# Patient Record
Sex: Female | Born: 1935 | Race: White | Hispanic: No | Smoking: Former smoker
Health system: Southern US, Community
[De-identification: ages and names within clinical notes are randomized; demographics above are authoritative.]

## PROBLEM LIST (undated history)

## (undated) DIAGNOSIS — I82409 Acute embolism and thrombosis of unspecified deep veins of unspecified lower extremity: Secondary | ICD-10-CM

## (undated) DIAGNOSIS — I2510 Atherosclerotic heart disease of native coronary artery without angina pectoris: Secondary | ICD-10-CM

## (undated) DIAGNOSIS — I050 Rheumatic mitral stenosis: Secondary | ICD-10-CM

## (undated) DIAGNOSIS — I509 Heart failure, unspecified: Secondary | ICD-10-CM

## (undated) DIAGNOSIS — D509 Iron deficiency anemia, unspecified: Secondary | ICD-10-CM

## (undated) DIAGNOSIS — I4891 Unspecified atrial fibrillation: Secondary | ICD-10-CM

## (undated) DIAGNOSIS — I739 Peripheral vascular disease, unspecified: Secondary | ICD-10-CM

## (undated) DIAGNOSIS — Z9289 Personal history of other medical treatment: Secondary | ICD-10-CM

## (undated) DIAGNOSIS — M169 Osteoarthritis of hip, unspecified: Secondary | ICD-10-CM

## (undated) DIAGNOSIS — E785 Hyperlipidemia, unspecified: Secondary | ICD-10-CM

## (undated) DIAGNOSIS — N200 Calculus of kidney: Secondary | ICD-10-CM

## (undated) DIAGNOSIS — I5032 Chronic diastolic (congestive) heart failure: Secondary | ICD-10-CM

## (undated) DIAGNOSIS — I219 Acute myocardial infarction, unspecified: Secondary | ICD-10-CM

## (undated) DIAGNOSIS — I10 Essential (primary) hypertension: Secondary | ICD-10-CM

## (undated) DIAGNOSIS — C4490 Unspecified malignant neoplasm of skin, unspecified: Secondary | ICD-10-CM

## (undated) DIAGNOSIS — G8929 Other chronic pain: Secondary | ICD-10-CM

## (undated) DIAGNOSIS — M1990 Unspecified osteoarthritis, unspecified site: Secondary | ICD-10-CM

## (undated) DIAGNOSIS — I350 Nonrheumatic aortic (valve) stenosis: Secondary | ICD-10-CM

## (undated) DIAGNOSIS — Z9641 Presence of insulin pump (external) (internal): Secondary | ICD-10-CM

## (undated) DIAGNOSIS — M546 Pain in thoracic spine: Secondary | ICD-10-CM

## (undated) DIAGNOSIS — I482 Chronic atrial fibrillation, unspecified: Secondary | ICD-10-CM

## (undated) DIAGNOSIS — E119 Type 2 diabetes mellitus without complications: Secondary | ICD-10-CM

## (undated) DIAGNOSIS — I872 Venous insufficiency (chronic) (peripheral): Secondary | ICD-10-CM

## (undated) DIAGNOSIS — M549 Dorsalgia, unspecified: Secondary | ICD-10-CM

## (undated) DIAGNOSIS — J9600 Acute respiratory failure, unspecified whether with hypoxia or hypercapnia: Secondary | ICD-10-CM

## (undated) DIAGNOSIS — G43909 Migraine, unspecified, not intractable, without status migrainosus: Secondary | ICD-10-CM

## (undated) HISTORY — DX: Heart failure, unspecified: I50.9

## (undated) HISTORY — DX: Venous insufficiency (chronic) (peripheral): I87.2

## (undated) HISTORY — DX: Chronic diastolic (congestive) heart failure: I50.32

## (undated) HISTORY — PX: BACK SURGERY: SHX140

## (undated) HISTORY — DX: Essential (primary) hypertension: I10

## (undated) HISTORY — DX: Osteoarthritis of hip, unspecified: M16.9

## (undated) HISTORY — DX: Unspecified atrial fibrillation: I48.91

## (undated) HISTORY — DX: Hyperlipidemia, unspecified: E78.5

## (undated) HISTORY — PX: JOINT REPLACEMENT: SHX530

## (undated) HISTORY — PX: ABDOMINAL HYSTERECTOMY: SHX81

## (undated) HISTORY — PX: TONSILLECTOMY: SUR1361

## (undated) HISTORY — PX: APPENDECTOMY: SHX54

## (undated) HISTORY — DX: Peripheral vascular disease, unspecified: I73.9

## (undated) HISTORY — PX: CHOLECYSTECTOMY OPEN: SUR202

## (undated) HISTORY — DX: Nonrheumatic aortic (valve) stenosis: I35.0

---
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
## 1989-08-20 HISTORY — PX: TOTAL HIP ARTHROPLASTY: SHX124

## 1992-08-20 HISTORY — PX: REVISION TOTAL HIP ARTHROPLASTY: SHX766

## 1997-08-20 HISTORY — PX: LUMBAR DISC SURGERY: SHX700

## 1998-01-28 ENCOUNTER — Ambulatory Visit (HOSPITAL_COMMUNITY): Admission: RE | Admit: 1998-01-28 | Discharge: 1998-01-28 | Payer: Self-pay | Admitting: Orthopaedic Surgery

## 1998-03-15 ENCOUNTER — Ambulatory Visit (HOSPITAL_COMMUNITY): Admission: RE | Admit: 1998-03-15 | Discharge: 1998-03-16 | Payer: Self-pay | Admitting: Orthopaedic Surgery

## 1998-07-27 ENCOUNTER — Encounter: Admission: RE | Admit: 1998-07-27 | Discharge: 1998-07-27 | Payer: Self-pay | Admitting: Family Medicine

## 1998-10-21 ENCOUNTER — Ambulatory Visit (HOSPITAL_COMMUNITY): Admission: RE | Admit: 1998-10-21 | Discharge: 1998-10-21 | Payer: Self-pay | Admitting: Family Medicine

## 1998-10-21 ENCOUNTER — Encounter: Admission: RE | Admit: 1998-10-21 | Discharge: 1998-10-21 | Payer: Self-pay | Admitting: Family Medicine

## 1998-12-05 ENCOUNTER — Encounter: Payer: Self-pay | Admitting: Cardiology

## 1998-12-05 ENCOUNTER — Ambulatory Visit (HOSPITAL_COMMUNITY): Admission: RE | Admit: 1998-12-05 | Discharge: 1998-12-05 | Payer: Self-pay | Admitting: Cardiology

## 1999-01-20 ENCOUNTER — Encounter: Admission: RE | Admit: 1999-01-20 | Discharge: 1999-01-20 | Payer: Self-pay | Admitting: Family Medicine

## 2000-08-20 HISTORY — PX: ILIAC ARTERY STENT: SHX1786

## 2000-12-11 ENCOUNTER — Encounter: Payer: Self-pay | Admitting: Cardiology

## 2000-12-11 ENCOUNTER — Ambulatory Visit (HOSPITAL_COMMUNITY): Admission: RE | Admit: 2000-12-11 | Discharge: 2000-12-11 | Payer: Self-pay | Admitting: Cardiology

## 2003-10-02 ENCOUNTER — Emergency Department (HOSPITAL_COMMUNITY): Admission: EM | Admit: 2003-10-02 | Discharge: 2003-10-03 | Payer: Self-pay | Admitting: Emergency Medicine

## 2003-12-17 ENCOUNTER — Encounter: Admission: RE | Admit: 2003-12-17 | Discharge: 2003-12-17 | Payer: Self-pay | Admitting: Internal Medicine

## 2004-02-11 ENCOUNTER — Encounter: Admission: RE | Admit: 2004-02-11 | Discharge: 2004-02-11 | Payer: Self-pay | Admitting: Internal Medicine

## 2004-06-06 ENCOUNTER — Encounter: Admission: RE | Admit: 2004-06-06 | Discharge: 2004-08-08 | Payer: Self-pay | Admitting: Internal Medicine

## 2005-01-26 ENCOUNTER — Encounter: Admission: RE | Admit: 2005-01-26 | Discharge: 2005-01-26 | Payer: Self-pay | Admitting: Orthopaedic Surgery

## 2005-02-15 ENCOUNTER — Encounter: Admission: RE | Admit: 2005-02-15 | Discharge: 2005-02-15 | Payer: Self-pay | Admitting: Orthopaedic Surgery

## 2005-12-20 ENCOUNTER — Encounter: Admission: RE | Admit: 2005-12-20 | Discharge: 2005-12-20 | Payer: Self-pay | Admitting: Internal Medicine

## 2008-09-23 ENCOUNTER — Encounter: Payer: Self-pay | Admitting: Endocrinology

## 2008-10-18 ENCOUNTER — Encounter: Payer: Self-pay | Admitting: Endocrinology

## 2009-06-09 ENCOUNTER — Ambulatory Visit: Payer: Self-pay | Admitting: Cardiovascular Disease

## 2009-06-11 ENCOUNTER — Emergency Department (HOSPITAL_COMMUNITY): Admission: EM | Admit: 2009-06-11 | Discharge: 2009-06-11 | Payer: Self-pay | Admitting: Emergency Medicine

## 2009-06-12 ENCOUNTER — Encounter (INDEPENDENT_AMBULATORY_CARE_PROVIDER_SITE_OTHER): Payer: Self-pay | Admitting: Emergency Medicine

## 2009-06-12 ENCOUNTER — Ambulatory Visit: Payer: Self-pay | Admitting: Vascular Surgery

## 2009-06-12 ENCOUNTER — Ambulatory Visit (HOSPITAL_COMMUNITY): Admission: RE | Admit: 2009-06-12 | Discharge: 2009-06-12 | Payer: Self-pay | Admitting: Emergency Medicine

## 2009-06-13 ENCOUNTER — Telehealth: Payer: Self-pay | Admitting: Cardiovascular Disease

## 2009-06-22 ENCOUNTER — Encounter: Payer: Self-pay | Admitting: Endocrinology

## 2009-07-05 ENCOUNTER — Encounter: Payer: Self-pay | Admitting: Endocrinology

## 2009-07-11 ENCOUNTER — Ambulatory Visit: Payer: Self-pay | Admitting: Cardiovascular Disease

## 2009-07-12 ENCOUNTER — Telehealth: Payer: Self-pay | Admitting: Cardiovascular Disease

## 2009-07-25 ENCOUNTER — Encounter: Payer: Self-pay | Admitting: Endocrinology

## 2009-07-26 ENCOUNTER — Telehealth: Payer: Self-pay | Admitting: Cardiovascular Disease

## 2009-08-03 ENCOUNTER — Encounter (INDEPENDENT_AMBULATORY_CARE_PROVIDER_SITE_OTHER): Payer: Self-pay | Admitting: *Deleted

## 2009-08-03 ENCOUNTER — Ambulatory Visit: Payer: Self-pay | Admitting: Cardiovascular Disease

## 2009-08-04 ENCOUNTER — Ambulatory Visit: Payer: Self-pay | Admitting: Endocrinology

## 2009-08-04 DIAGNOSIS — M1990 Unspecified osteoarthritis, unspecified site: Secondary | ICD-10-CM | POA: Insufficient documentation

## 2009-08-04 DIAGNOSIS — L98499 Non-pressure chronic ulcer of skin of other sites with unspecified severity: Secondary | ICD-10-CM

## 2009-08-04 DIAGNOSIS — I70209 Unspecified atherosclerosis of native arteries of extremities, unspecified extremity: Secondary | ICD-10-CM

## 2009-08-04 DIAGNOSIS — I10 Essential (primary) hypertension: Secondary | ICD-10-CM

## 2009-08-04 DIAGNOSIS — E785 Hyperlipidemia, unspecified: Secondary | ICD-10-CM | POA: Insufficient documentation

## 2009-08-08 ENCOUNTER — Ambulatory Visit: Payer: Self-pay | Admitting: Endocrinology

## 2009-08-08 ENCOUNTER — Telehealth: Payer: Self-pay | Admitting: Endocrinology

## 2009-08-09 LAB — CONVERTED CEMR LAB: Hgb A1c MFr Bld: 6.9 % — ABNORMAL HIGH (ref 4.6–6.5)

## 2009-09-12 ENCOUNTER — Ambulatory Visit: Payer: Self-pay | Admitting: Cardiovascular Disease

## 2009-09-19 ENCOUNTER — Ambulatory Visit: Payer: Self-pay | Admitting: Cardiovascular Disease

## 2009-09-20 ENCOUNTER — Ambulatory Visit: Payer: Self-pay | Admitting: Endocrinology

## 2009-10-05 ENCOUNTER — Emergency Department (HOSPITAL_COMMUNITY): Admission: EM | Admit: 2009-10-05 | Discharge: 2009-10-05 | Payer: Self-pay | Admitting: Emergency Medicine

## 2009-10-05 ENCOUNTER — Encounter: Payer: Self-pay | Admitting: Internal Medicine

## 2009-10-05 LAB — CONVERTED CEMR LAB
BUN: 38 mg/dL
Chloride: 106 meq/L
Creatinine, Ser: 1.1 mg/dL
Glucose, Bld: 197 mg/dL
Hemoglobin: 11.5 g/dL
Sodium: 140 meq/L

## 2009-10-10 ENCOUNTER — Ambulatory Visit: Payer: Self-pay | Admitting: Cardiovascular Disease

## 2009-10-25 ENCOUNTER — Telehealth: Payer: Self-pay | Admitting: Cardiovascular Disease

## 2009-11-04 ENCOUNTER — Ambulatory Visit: Payer: Self-pay | Admitting: Endocrinology

## 2009-11-04 ENCOUNTER — Ambulatory Visit: Payer: Self-pay | Admitting: Internal Medicine

## 2009-11-04 DIAGNOSIS — I872 Venous insufficiency (chronic) (peripheral): Secondary | ICD-10-CM | POA: Insufficient documentation

## 2009-11-04 LAB — CONVERTED CEMR LAB: Hgb A1c MFr Bld: 7 % — ABNORMAL HIGH (ref 4.6–6.5)

## 2009-11-07 ENCOUNTER — Encounter: Payer: Self-pay | Admitting: Internal Medicine

## 2009-11-28 ENCOUNTER — Ambulatory Visit: Payer: Self-pay | Admitting: Internal Medicine

## 2009-11-28 DIAGNOSIS — N3090 Cystitis, unspecified without hematuria: Secondary | ICD-10-CM | POA: Insufficient documentation

## 2009-11-28 LAB — CONVERTED CEMR LAB
Glucose, Urine, Semiquant: NEGATIVE
Specific Gravity, Urine: <1.005
Urobilinogen, UA: 0.2

## 2009-11-29 DIAGNOSIS — B379 Candidiasis, unspecified: Secondary | ICD-10-CM | POA: Insufficient documentation

## 2009-12-06 ENCOUNTER — Telehealth: Payer: Self-pay | Admitting: Internal Medicine

## 2009-12-12 LAB — HM MAMMOGRAPHY

## 2010-01-11 ENCOUNTER — Ambulatory Visit: Payer: Self-pay | Admitting: Cardiovascular Disease

## 2010-01-11 ENCOUNTER — Telehealth: Payer: Self-pay | Admitting: Endocrinology

## 2010-02-03 ENCOUNTER — Ambulatory Visit: Payer: Self-pay | Admitting: Endocrinology

## 2010-02-03 ENCOUNTER — Ambulatory Visit: Payer: Self-pay | Admitting: Internal Medicine

## 2010-02-03 LAB — CONVERTED CEMR LAB
AST: 31 units/L (ref 0–37)
Cholesterol: 156 mg/dL (ref 0–200)
HDL: 47 mg/dL (ref >39.00)
Total CHOL/HDL Ratio: 3
Total Protein: 6.8 g/dL (ref 6.0–8.3)
Triglycerides: 188 mg/dL — ABNORMAL HIGH (ref 0.0–149.0)

## 2010-02-28 ENCOUNTER — Ambulatory Visit: Payer: Self-pay | Admitting: Internal Medicine

## 2010-02-28 DIAGNOSIS — M542 Cervicalgia: Secondary | ICD-10-CM | POA: Insufficient documentation

## 2010-03-06 ENCOUNTER — Ambulatory Visit: Payer: Self-pay | Admitting: Internal Medicine

## 2010-03-07 ENCOUNTER — Encounter: Payer: Self-pay | Admitting: Internal Medicine

## 2010-04-04 ENCOUNTER — Telehealth: Payer: Self-pay | Admitting: Internal Medicine

## 2010-04-20 ENCOUNTER — Encounter: Payer: Self-pay | Admitting: Internal Medicine

## 2010-05-01 ENCOUNTER — Encounter: Payer: Self-pay | Admitting: Internal Medicine

## 2010-05-09 ENCOUNTER — Ambulatory Visit: Payer: Self-pay | Admitting: Endocrinology

## 2010-05-09 LAB — CONVERTED CEMR LAB
BUN: 35 mg/dL — ABNORMAL HIGH (ref 6–23)
CO2: 32 meq/L (ref 19–32)
Calcium: 10.9 mg/dL — ABNORMAL HIGH (ref 8.4–10.5)
GFR calc non Af Amer: 44.13 mL/min (ref >60)
Glucose, Bld: 137 mg/dL — ABNORMAL HIGH (ref 70–99)
Potassium: 4.5 meq/L (ref 3.5–5.1)
Sodium: 142 meq/L (ref 135–145)

## 2010-05-16 ENCOUNTER — Ambulatory Visit: Payer: Self-pay | Admitting: Cardiovascular Disease

## 2010-05-16 ENCOUNTER — Telehealth (INDEPENDENT_AMBULATORY_CARE_PROVIDER_SITE_OTHER): Payer: Self-pay | Admitting: *Deleted

## 2010-05-22 ENCOUNTER — Telehealth: Payer: Self-pay | Admitting: Cardiovascular Disease

## 2010-05-23 ENCOUNTER — Telehealth: Payer: Self-pay | Admitting: Cardiovascular Disease

## 2010-05-24 ENCOUNTER — Telehealth: Payer: Self-pay | Admitting: Internal Medicine

## 2010-05-25 ENCOUNTER — Encounter: Payer: Self-pay | Admitting: Cardiovascular Disease

## 2010-05-26 ENCOUNTER — Encounter: Payer: Self-pay | Admitting: Cardiovascular Disease

## 2010-05-26 ENCOUNTER — Ambulatory Visit: Payer: Self-pay

## 2010-06-01 ENCOUNTER — Ambulatory Visit: Payer: Self-pay | Admitting: Cardiovascular Disease

## 2010-06-05 ENCOUNTER — Telehealth: Payer: Self-pay | Admitting: Internal Medicine

## 2010-07-11 ENCOUNTER — Ambulatory Visit: Payer: Self-pay | Admitting: Internal Medicine

## 2010-07-11 DIAGNOSIS — H918X9 Other specified hearing loss, unspecified ear: Secondary | ICD-10-CM

## 2010-07-11 DIAGNOSIS — M25559 Pain in unspecified hip: Secondary | ICD-10-CM

## 2010-07-25 ENCOUNTER — Ambulatory Visit: Payer: Self-pay | Admitting: Vascular Surgery

## 2010-07-25 ENCOUNTER — Encounter: Payer: Self-pay | Admitting: Internal Medicine

## 2010-07-31 ENCOUNTER — Telehealth: Payer: Self-pay | Admitting: Internal Medicine

## 2010-08-03 ENCOUNTER — Encounter: Payer: Self-pay | Admitting: Internal Medicine

## 2010-08-07 ENCOUNTER — Ambulatory Visit: Payer: Self-pay | Admitting: Endocrinology

## 2010-08-07 ENCOUNTER — Ambulatory Visit: Payer: Self-pay | Admitting: Internal Medicine

## 2010-08-07 LAB — CONVERTED CEMR LAB
Glucose, Urine, Semiquant: NEGATIVE
Ketones, urine, test strip: NEGATIVE
Urobilinogen, UA: 0.2

## 2010-08-30 ENCOUNTER — Telehealth: Payer: Self-pay | Admitting: Internal Medicine

## 2010-08-31 ENCOUNTER — Ambulatory Visit
Admission: RE | Admit: 2010-08-31 | Discharge: 2010-08-31 | Payer: Self-pay | Source: Home / Self Care | Attending: Cardiovascular Disease | Admitting: Cardiovascular Disease

## 2010-09-04 ENCOUNTER — Telehealth (INDEPENDENT_AMBULATORY_CARE_PROVIDER_SITE_OTHER): Payer: Self-pay | Admitting: *Deleted

## 2010-09-07 ENCOUNTER — Telehealth (INDEPENDENT_AMBULATORY_CARE_PROVIDER_SITE_OTHER): Payer: Self-pay

## 2010-09-11 ENCOUNTER — Encounter (HOSPITAL_COMMUNITY)
Admission: RE | Admit: 2010-09-11 | Discharge: 2010-09-19 | Payer: Self-pay | Source: Home / Self Care | Attending: Cardiovascular Disease | Admitting: Cardiovascular Disease

## 2010-09-11 ENCOUNTER — Ambulatory Visit: Admission: RE | Admit: 2010-09-11 | Discharge: 2010-09-11 | Payer: Self-pay | Source: Home / Self Care

## 2010-09-11 ENCOUNTER — Encounter: Payer: Self-pay | Admitting: Internal Medicine

## 2010-09-15 ENCOUNTER — Ambulatory Visit
Admission: RE | Admit: 2010-09-15 | Discharge: 2010-09-15 | Payer: Self-pay | Source: Home / Self Care | Attending: Cardiovascular Disease | Admitting: Cardiovascular Disease

## 2010-09-15 ENCOUNTER — Telehealth: Payer: Self-pay | Admitting: Cardiovascular Disease

## 2010-09-18 ENCOUNTER — Encounter: Payer: Self-pay | Admitting: Internal Medicine

## 2010-09-19 NOTE — Progress Notes (Signed)
  Phone Note Outgoing Call   Call placed by: Dessie Coma  LPN,  May 23, 2010 10:45 AM Call placed to: Patient Summary of Call: Patient notified per Dr. Kirke Corin, BP readings were OK-will recheck at f/u.

## 2010-09-19 NOTE — Miscellaneous (Signed)
Summary: Orders Update  Clinical Lists Changes  Orders: Added new Test order of Arterial Duplex Lower Extremity (Arterial Duplex Low) - Signed 

## 2010-09-19 NOTE — Progress Notes (Signed)
Summary: pravastatin  Phone Note Refill Request Message from:  Fax from Pharmacy on December 06, 2009 3:36 PM  Refills Requested: Medication #1:  PRAVACHOL 10 MG TABS 1 by mouth at bedtime   Last Refilled: 11/07/2009  Method Requested: Electronic Initial call taken by: Orlan Leavens,  December 06, 2009 3:36 PM    Prescriptions: PRAVACHOL 10 MG TABS (PRAVASTATIN SODIUM) 1 by mouth at bedtime  #30 x 4   Entered by:   Orlan Leavens   Authorized by:   Newt Lukes MD   Signed by:   Orlan Leavens on 12/06/2009   Method used:   Electronically to        [facility] (retail)       [address]       [place], [place]  [address]       Ph: [phone]       Fax: [phone]   RxID:   [id number]

## 2010-09-19 NOTE — Letter (Signed)
Summary: [facility]  [facility]   Imported By: Lennie Odor 05/02/2010 11:23:57  _____________________________________________________________________  External Attachment:    Type:   Image     Comment:   External Document

## 2010-09-19 NOTE — Assessment & Plan Note (Signed)
Summary: NEW/ SECURE HORIZIONS/NWS   Vital Signs:  Patient profile:   77 year old female Height:      64 inches Weight:      164.25 pounds BMI:     28.30 O2 Sat:      93 % on Room air Temp:     97.7 degrees F oral Pulse rate:   85 / minute BP sitting:   126 / 30  (left arm)  Vitals Entered By: Lucious Groves (November 04, 2009 9:21 AM)  O2 Flow:  Room air CC: Pt sees Everardo All, here to est primary care with [employer]. Pt recently had leg pain/burning and would like to discuss./kb Is Patient Diabetic? Yes Did you bring your meter with you today? No Pain Assessment Patient in pain? no        Primary Care Provider:  Newt Lukes, MD  CC:  Pt sees Everardo All and here to est primary care with [facility]. Pt recently had leg pain/burning and would like to discuss./kb.  History of Present Illness: new pt to me and our PC division - though follows with endo here  1) recent cellulitis LLE - 2/16 ER visit for same - tx with clinda, prev FQ (levaquin) assoc with chronic lymphedema - DVT scans neg - no change in edema or pain despite abx or inc diuretics - no fever - no inc redness   2) DM2 - reports compliance with ongoing medical treatment and no changes in medication dose or frequency. denies adverse side effects related to current therapy. denies hypoglycemia symptoms - follows with endo for same  3) dyslipidemia - reports compliance with ongoing medical treatment and no changes in medication dose or frequency. denies adverse side effects related to current therapy. no GI or muscle SE or c/o  4) HTN - reports compliance with ongoing medical treatment and no changes in medication dose or frequency. denies adverse side effects related to current therapy. no HA or CP - needs refill on hydralazine  Clinical Review Panels:  Prevention   Last Mammogram:  historical (12/18/2008)  Immunizations   Last Flu Vaccine:  historical (05/20/2009)  Diabetes Management   HgBA1C:  6.9  (08/08/2009)   Last Flu Vaccine:  historical (05/20/2009)   Current Medications (verified): 1)  Clonidine Hcl 0.2 Mg Tabs (Clonidine Hcl) .Marland Kitchen.. 1 By Mouth Qid 2)  Hydralazine Hcl 10 Mg Tabs (Hydralazine Hcl) .Marland Kitchen.. 1 By Mouth Qid 3)  Diovan 320 Mg Tabs (Valsartan) .Marland Kitchen.. 1 By Mouth Qhs 4)  Amlodipine Besylate 10 Mg Tabs (Amlodipine Besylate) .Marland Kitchen.. 1 By Mouth Qhs 5)  Pravachol 10 Mg Tabs (Pravastatin Sodium) .Marland Kitchen.. 1 By Mouth Qhs 6)  Humalog 100 Unit/ml Soln (Insulin Lispro (Human)) .... Sliding Scale 7)  Omeprazole 40 Mg Cpdr (Omeprazole) .Marland Kitchen.. 1 By Mouth Qam 8)  Oscal 500/200 D-3 500-200 Mg-Unit Tabs (Calcium-Vitamin D) .Marland Kitchen.. 1 By Mouth Bid 9)  Aspir-Low 81 Mg Tbec (Aspirin) .Marland Kitchen.. 1 By Mouth Qam 10)  Fish Oil 1200 Mg Caps (Omega-3 Fatty Acids) .... 2 Daily 11)  Cinnamon 500 Mg Tabs (Cinnamon) .... 4 Tabs Daily 12)  Vitamin D3 2000 Unit Caps (Cholecalciferol) .Marland Kitchen.. 1 Daily 13)  Coq-10 300 Mg Caps (Coenzyme Q10) .Marland Kitchen.. 1 Daily 14)  Vitamin C 500mg  .... 1 Qd 15)  Potassium Gluconate 595 Mg Cr-Tabs (Potassium Gluconate) .Marland Kitchen.. 1 Qd 16)  Gabapentin 300 Mg Caps (Gabapentin) .... Take One Capsule By Mouth Every Night As Needed 17)  Furosemide 40 Mg Tabs (Furosemide) .... 2 Q Am 18)  One A Day Womens Multivitamin .Marland Kitchen.. 1 By Mouth Qd  Allergies (verified): 1)  ! Tekturna (Aliskiren Fumarate) 2)  ! Diltiazem Hcl Er Beads 3)  ! * Sular 4)  ! * Minoridil 5)  ! * Fenofilrate 6)  ! * Capton 7)  ! * Hctz 8)  ! * Carevedilol 9)  ! * Valturna 10)  ! * Tramadol Hcl 50 Mg  Past History:  Past Medical History: Diabetes mellitus, type 2 hypertension dyslipidemia chronic lymphedema, LLE osteoarthritis PVD  MD rooster: endo -ellison cards -allen ([facility])  Family History: dm: father and 5/8 sibs  Social History: retired widowed 1991 here with son today, who lives with patient.  he helps her change her sites and changing cartriges every few days, but he says she can manage boluses  Review of  Systems       see HPI above. I have reviewed all other systems and they were negative.   Physical Exam  General:  alert, well-developed, well-nourished, and cooperative to examination.   son Alinda Money at side Head:  Normocephalic and atraumatic without obvious abnormalities. No apparent alopecia or balding. Eyes:  vision grossly intact; pupils equal, round and reactive to light.  conjunctiva and lids normal.    Ears:  normal pinnae bilaterally, without erythema, swelling, or tenderness to palpation. TMs clear, without effusion, or cerumen impaction. Hearing grossly normal bilaterally  Mouth:  teeth and gums in good repair; mucous membranes moist, without lesions or ulcers. oropharynx clear without exudate, erythema.  Lungs:  normal respiratory effort, no intercostal retractions or use of accessory muscles; normal breath sounds bilaterally - no crackles and no wheezes.    Heart:  normal rate, regular rhythm, no murmur, and no rub. RLE without edema, LLE 1+ tight edema to below knee with venous insuff skin changes distally. normal DP pulses and normal cap refill in all 4 extremities    Abdomen:  soft, non-tender, normal bowel sounds, no distention; no masses and no appreciable hepatomegaly or splenomegaly.   Msk:  No deformity or scoliosis noted of thoracic or lumbar spine.   Neurologic:  alert & oriented X3 and cranial nerves II-XII symetrically intact.  strength normal in all extremities, sensation intact to light touch, and gait normal. speech fluent without dysarthria or aphasia; follows commands with good comprehension.  Skin:  chronic venous insuff changes distally LLE, no ulcerations or cellulitis, otherwise, no rashes, vesicles, ulcers, or erythema. No nodules or irregularity to palpation.  Psych:  Oriented X3, memory intact for recent and remote, normally interactive, good eye contact, not anxious appearing, not depressed appearing, and not agitated.      Impression &  Recommendations:  Problem # 1:  IDDM (ICD-250.01) per endo Her updated medication list for this problem includes:    Diovan 320 Mg Tabs (Valsartan) .Marland Kitchen... 1 by mouth at bedtime    Humalog 100 Unit/ml Soln (Insulin lispro (human)) ..... Sliding scale    Aspir-low 81 Mg Tbec (Aspirin) .Marland Kitchen... 1 by mouth  every morning  Reviewed HgBA1c results: 6.9 (08/08/2009)  Problem # 2:  HYPERTENSION (ICD-401.9)  Her updated medication list for this problem includes:    Clonidine Hcl 0.2 Mg Tabs (Clonidine hcl) .Marland Kitchen... 1 by mouth four times a day    Hydralazine Hcl 10 Mg Tabs (Hydralazine hcl) .Marland Kitchen... 1 by mouth four times a day    Diovan 320 Mg Tabs (Valsartan) .Marland Kitchen... 1 by mouth at bedtime    Amlodipine Besylate 10 Mg Tabs (Amlodipine besylate) .Marland KitchenMarland KitchenMarland KitchenMarland Kitchen 1  by mouth at bedtime    Furosemide 40 Mg Tabs (Furosemide) .Marland Kitchen... 2 tabs  every morning and 1 by mouth at 2pm  BP today: 126/30 Prior BP: 138/52 (09/20/2009)  Orders: Prescription Created Electronically [phone])  Problem # 3:  HYPERCHOLESTEROLEMIA (ICD-272.0) will send for records from last PCP - if unable to review before next visit, check FLP and LFTs next OV here Her updated medication list for this problem includes:    Pravachol 10 Mg Tabs (Pravastatin sodium) .Marland Kitchen... 1 by mouth at bedtime  Problem # 4:  VENOUS INSUFFICIENCY, LEFT LEG (ICD-459.81) reassured re: proper tx - recent ER records 2/16 reviewed with pt - diuretic dose clarified for pt  Problem # 5:  OSTEOARTHRITIS (ICD-715.90)  Her updated medication list for this problem includes:    Aspir-low 81 Mg Tbec (Aspirin) .Marland Kitchen... 1 by mouth  every morning  Complete Medication List: 1)  Clonidine Hcl 0.2 Mg Tabs (Clonidine hcl) .Marland Kitchen.. 1 by mouth four times a day 2)  Hydralazine Hcl 10 Mg Tabs (Hydralazine hcl) .Marland Kitchen.. 1 by mouth four times a day 3)  Diovan 320 Mg Tabs (Valsartan) .Marland Kitchen.. 1 by mouth at bedtime 4)  Amlodipine Besylate 10 Mg Tabs (Amlodipine besylate) .Marland Kitchen.. 1 by mouth at bedtime 5)   Pravachol 10 Mg Tabs (Pravastatin sodium) .Marland Kitchen.. 1 by mouth at bedtime 6)  Humalog 100 Unit/ml Soln (Insulin lispro (human)) .... Sliding scale 7)  Omeprazole 40 Mg Cpdr (Omeprazole) .Marland Kitchen.. 1 by mouth  every morning 8)  Oscal 500/200 D-3 500-200 Mg-unit Tabs (Calcium-vitamin d) .Marland Kitchen.. 1 by mouth two times a day 9)  Aspir-low 81 Mg Tbec (Aspirin) .Marland Kitchen.. 1 by mouth  every morning 10)  Fish Oil 1200 Mg Caps (Omega-3 fatty acids) .... 2 daily 11)  Cinnamon 500 Mg Tabs (Cinnamon) .... 4 tabs daily 12)  Vitamin D3 2000 Unit Caps (Cholecalciferol) .Marland Kitchen.. 1 daily 13)  Coq-10 300 Mg Caps (coenzyme Q10)  .Marland Kitchen.. 1 daily 14)  Vitamin C 500mg   .... 1 once daily 15)  Potassium Gluconate 595 Mg Cr-tabs (Potassium gluconate) .Marland Kitchen.. 1 once daily 16)  Gabapentin 300 Mg Caps (Gabapentin) .... Take one capsule by mouth every night as needed 17)  Furosemide 40 Mg Tabs (Furosemide) .... 2 tabs  every morning and 1 by mouth at 2pm 18)  One A Day Womens Multivitamin  .Marland Kitchen.. 1 by mouth once daily  Patient Instructions: 1)  it was good to see you today.  2)  will send for records from your other doctor in [place] to review - 3)  refills on your medications as discussed - sent to [facility] 4)  continue medications as ongoing - ok to take only one fluid pill in the afternoon and 2 in AM 5)  Please schedule a follow-up appointment in 3-4 months (same as when you see dr. Everardo All), sooner if problems.  Prescriptions: HYDRALAZINE HCL 10 MG TABS (HYDRALAZINE HCL) 1 by mouth qid  #120 x 3   Entered and Authorized by:   Newt Lukes MD   Signed by:   Newt Lukes MD on 11/04/2009   Method used:   Electronically to        [facility] (retail)       [address]       [place], [place]  [address]       Ph: [phone]       Fax: [phone]   RxID:   [id number]

## 2010-09-19 NOTE — Letter (Signed)
Summary: Power Wheelchair/[facility]  [facility]   Imported By: Sherian Rein 03/08/2010 12:15:58  _____________________________________________________________________  External Attachment:    Type:   Image     Comment:   External Document

## 2010-09-19 NOTE — Progress Notes (Signed)
Summary: Rx refill req  Phone Note Call from Patient   Caller: Patient Summary of Call: Pt called requesting refill of Nystatin powder for Candidiasis under both breast.  [facility] Initial call taken by: Margaret Pyle, CMA,  June 05, 2010 4:37 PM  Follow-up for Phone Call        ok - nystatin powder apply to affected skin three times a day- four times a day as needed - thanks Follow-up by: Newt Lukes MD,  June 05, 2010 5:27 PM    New/Updated Medications: NYSTATIN 100000 UNIT/GM POWD (NYSTATIN) apply to affected area 3-4 times as needed Prescriptions: NYSTATIN 100000 UNIT/GM POWD (NYSTATIN) apply to affected area 3-4 times as needed  #1 x 1   Entered by:   Margaret Pyle, CMA   Authorized by:   Newt Lukes MD   Signed by:   Margaret Pyle, CMA on 06/06/2010   Method used:   Electronically to        [facility] (retail)       [address]       [place], [place]  [address]       Ph: [phone]       Fax: [phone]   RxID:   [id number]

## 2010-09-19 NOTE — Letter (Signed)
Summary: Response/[facility]  Response/[facility]   Imported By: Lester Bowie 05/01/2010 07:34:31  _____________________________________________________________________  External Attachment:    Type:   Image     Comment:   External Document

## 2010-09-19 NOTE — Assessment & Plan Note (Signed)
Summary: RASH/NWS   Vital Signs:  Patient profile:   77 year old female Height:      64 inches (162.56 cm) Weight:      160.8 pounds (73.09 kg) O2 Sat:      94 % on Room air Temp:     98.2 degrees F (36.78 degrees C) oral Pulse rate:   85 / minute BP sitting:   156 / 60  (left arm) Cuff size:   regular  Vitals Entered By: Orlan Leavens (November 28, 2009 3:51 PM)  O2 Flow:  Room air CC: rash under her breast Is Patient Diabetic? Yes Did you bring your meter with you today? No Pain Assessment Patient in pain? no        Primary Care Provider:  Newt Lukes, MD  CC:  rash under her breast.  History of Present Illness: here with c/o rash -  area affected is below both breasts L>R onset >6 mos ago - course is gtting worse- tried OTC "salve" - not helping - no fever or "spreading" no other body area affected -  denies hx similar symptoms -  also c/o urinary freq and wants kidney check - no fever - no dysuria or hematuria no abd pain aor back/flank pain   Current Medications (verified): 1)  Clonidine Hcl 0.2 Mg Tabs (Clonidine Hcl) .Marland Kitchen.. 1 By Mouth Four Times A Day 2)  Hydralazine Hcl 10 Mg Tabs (Hydralazine Hcl) .Marland Kitchen.. 1 By Mouth Four Times A Day 3)  Diovan 320 Mg Tabs (Valsartan) .Marland Kitchen.. 1 By Mouth At Bedtime 4)  Amlodipine Besylate 10 Mg Tabs (Amlodipine Besylate) .Marland Kitchen.. 1 By Mouth At Bedtime 5)  Pravachol 10 Mg Tabs (Pravastatin Sodium) .Marland Kitchen.. 1 By Mouth At Bedtime 6)  Humalog 100 Unit/ml Soln (Insulin Lispro (Human)) .... Sliding Scale 7)  Omeprazole 40 Mg Cpdr (Omeprazole) .Marland Kitchen.. 1 By Mouth  Every Morning 8)  Oscal 500/200 D-3 500-200 Mg-Unit Tabs (Calcium-Vitamin D) .Marland Kitchen.. 1 By Mouth Two Times A Day 9)  Aspir-Low 81 Mg Tbec (Aspirin) .Marland Kitchen.. 1 By Mouth  Every Morning 10)  Fish Oil 1200 Mg Caps (Omega-3 Fatty Acids) .... 2 Daily 11)  Cinnamon 500 Mg Tabs (Cinnamon) .... 4 Tabs Daily 12)  Vitamin D3 2000 Unit Caps (Cholecalciferol) .Marland Kitchen.. 1 Daily 13)  Coq-10 300 Mg Caps  (Coenzyme Q10) .Marland Kitchen.. 1 Daily 14)  Vitamin C 500mg  .... 1 Once Daily 15)  Potassium Gluconate 595 Mg Cr-Tabs (Potassium Gluconate) .Marland Kitchen.. 1 Once Daily 16)  Gabapentin 300 Mg Caps (Gabapentin) .... Take One Capsule By Mouth Every Night As Needed 17)  Furosemide 40 Mg Tabs (Furosemide) .... 2 Tabs  Every Morning and 1 By Mouth At 2pm 18)  One A Day Womens Multivitamin .Marland Kitchen.. 1 By Mouth Once Daily 19)  Furosemide 40 Mg Tabs (Furosemide) .... 2 Tablets At Breakfast  Allergies (verified): 1)  ! Tekturna (Aliskiren Fumarate) 2)  ! Diltiazem Hcl Er Beads 3)  ! * Sular 4)  ! * Minoridil 5)  ! * Fenofilrate 6)  ! * Capton 7)  ! * Hctz 8)  ! * Carevedilol 9)  ! * Valturna 10)  ! * Tramadol Hcl 50 Mg  Past History:  Past Medical History: Diabetes mellitus, type 2 hypertension dyslipidemia chronic lymphedema, LLE osteoarthritis PVD  MD rooster: endo -ellison cards -allen ( [facility])  Review of Systems  The patient denies fever, weight loss, chest pain, and abdominal pain.    Physical Exam  General:  alert, well-developed, well-nourished, and  cooperative to examination.    Lungs:  normal respiratory effort, no intercostal retractions or use of accessory muscles; normal breath sounds bilaterally - no crackles and no wheezes.    Heart:  normal rate, regular rhythm, no murmur, and no rub. RLE without edema, LLE 1+ tight edema to below knee with venous insuff skin changes distally.  Skin:  candidia changes beneath breast folds bilaterally   Impression & Recommendations:  Problem # 1:  CANDIDIASIS (ICD-112.9)  nystatin powder to affected skin -  likely exac by DM control - --  Orders: Prescription Created Electronically [phone])  Problem # 2:  CYSTITIS (ICD-595.9) +Udip tx abx x 3 days, no Ucx at this point in time Her updated medication list for this problem includes:    Ciprofloxacin Hcl 250 Mg Tabs (Ciprofloxacin hcl) .Marland Kitchen... 1 by mouth two times a day x 3 days  Orders: UA  Dipstick w/o Micro (manual) ([id number]) Prescription Created Electronically [phone])  Encouraged to push clear liquids, get enough rest, and take acetaminophen as needed. To be seen in 10 days if no improvement, sooner if worse.  Complete Medication List: 1)  Clonidine Hcl 0.2 Mg Tabs (Clonidine hcl) .Marland Kitchen.. 1 by mouth four times a day 2)  Hydralazine Hcl 10 Mg Tabs (Hydralazine hcl) .Marland Kitchen.. 1 by mouth four times a day 3)  Diovan 320 Mg Tabs (Valsartan) .Marland Kitchen.. 1 by mouth at bedtime 4)  Amlodipine Besylate 10 Mg Tabs (Amlodipine besylate) .Marland Kitchen.. 1 by mouth at bedtime 5)  Pravachol 10 Mg Tabs (Pravastatin sodium) .Marland Kitchen.. 1 by mouth at bedtime 6)  Humalog 100 Unit/ml Soln (Insulin lispro (human)) .... Sliding scale 7)  Omeprazole 40 Mg Cpdr (Omeprazole) .Marland Kitchen.. 1 by mouth  every morning 8)  Oscal 500/200 D-3 500-200 Mg-unit Tabs (Calcium-vitamin d) .Marland Kitchen.. 1 by mouth two times a day 9)  Aspir-low 81 Mg Tbec (Aspirin) .Marland Kitchen.. 1 by mouth  every morning 10)  Fish Oil 1200 Mg Caps (Omega-3 fatty acids) .... 2 daily 11)  Cinnamon 500 Mg Tabs (Cinnamon) .... 4 tabs daily 12)  Vitamin D3 2000 Unit Caps (Cholecalciferol) .Marland Kitchen.. 1 daily 13)  Coq-10 300 Mg Caps (coenzyme Q10)  .Marland Kitchen.. 1 daily 14)  Vitamin C 500mg   .... 1 once daily 15)  Potassium Gluconate 595 Mg Cr-tabs (Potassium gluconate) .Marland Kitchen.. 1 once daily 16)  Gabapentin 300 Mg Caps (Gabapentin) .... Take one capsule by mouth every night as needed 17)  Furosemide 40 Mg Tabs (Furosemide) .... 2 tabs  every morning and 1 by mouth at 2pm 18)  One A Day Womens Multivitamin  .Marland Kitchen.. 1 by mouth once daily 19)  Furosemide 40 Mg Tabs (Furosemide) .... 2 tablets at breakfast 20)  Nystatin 100000 Unit/gm Powd (Nystatin) .... Apply 3-4 tiimes per day to affected skin as directed 21)  Ciprofloxacin Hcl 250 Mg Tabs (Ciprofloxacin hcl) .Marland Kitchen.. 1 by mouth two times a day x 3 days  Patient Instructions: 1)  it was good to see you today.  2)  prescription powder to use for your rash -  3)  also  prescription antibiotic - cipro- for your bladder infection 4)  your prescriptions have been electronically submitted to your pharmacy. Please take as directed. Contact our office if you believe you're having problems with the medication(s).  5)  Please keep follow-up appointment in June as scheduled, call sooner if problems.  Prescriptions: CIPROFLOXACIN HCL 250 MG TABS (CIPROFLOXACIN HCL) 1 by mouth two times a day x 3 days  #6 x 0  Entered and Authorized by:   Newt Lukes MD   Signed by:   Newt Lukes MD on 11/28/2009   Method used:   Electronically to        [facility] (retail)       [address]       [place], [place]  [address]       Ph: [phone]       Fax: [phone]   RxID:   [id number] NYSTATIN 100000 UNIT/GM POWD (NYSTATIN) apply 3-4 tiimes per day to affected skin as directed  #1 x 1   Entered and Authorized by:   Newt Lukes MD   Signed by:   Newt Lukes MD on 11/28/2009   Method used:   Electronically to        [facility] (retail)       [address]       [place], [place]  [address]       Ph: [phone]       Fax: [phone]   RxID:   [id number]   Laboratory Results   Urine Tests    Routine Urinalysis   Color: yellow Appearance: Clear Glucose: negative   (Normal Range: Negative) Bilirubin: negative   (Normal Range: Negative) Ketone: negative   (Normal Range: Negative) Spec. Gravity: <1.005   (Normal Range: 1.003-1.035) Blood: negative   (Normal Range: Negative) pH: 5.0   (Normal Range: 5.0-8.0) Protein: negative   (Normal Range: Negative) Urobilinogen: 0.2   (Normal Range: 0-1) Nitrite: positive   (Normal Range: Negative) Leukocyte Esterace: small   (Normal Range: Negative)

## 2010-09-19 NOTE — Assessment & Plan Note (Signed)
Summary: L LEG SWOLLEN W/BLISTERS X OCTOBER/10--STC   Vital Signs:  Patient profile:   77 year old female Height:      64 inches (162.56 cm) Weight:      165.38 pounds (75.17 kg) O2 Sat:      96 % on Room air Temp:     97.0 degrees F (36.11 degrees C) oral Pulse rate:   72 / minute BP sitting:   138 / 52  (left arm) Cuff size:   regular  Vitals Entered By: Josph Macho CMA (September 20, 2009 9:56 AM)  O2 Flow:  Room air CC: Left lef swollen with blisters-pt states it has been off and on since Oct when she had blood clot in leg/ Pt states she is no longer taking Tramadol/ CF   Referring Provider:  Dicky Doe Primary Provider:  Ardyth Gal Rosalita Levan)  CC:  Left lef swollen with blisters-pt states it has been off and on since Oct when she had blood clot in leg/ Pt states she is no longer taking Tramadol/ CF.  History of Present Illness: pt states 1 month of moderate left leg pain, and associated redness.  no help with the 2 abx rx'ed last week.  she says she has had 2 (neg) venous doopplers of the left leg over the past few months. pt is still on the "sliding-scale" mealtime boluses, which average 20 units per meal.  she does not know how the boluses are calculated.  she brings a record of her cbg's which i have reviewed today.  it varies from 109-215, with no trend throughout the day.    Current Medications (verified): 1)  Clonidine Hcl 0.2 Mg Tabs (Clonidine Hcl) .Marland Kitchen.. 1 By Mouth Qid 2)  Hydralazine Hcl 10 Mg Tabs (Hydralazine Hcl) .Marland Kitchen.. 1 By Mouth Qid 3)  Diovan 320 Mg Tabs (Valsartan) .Marland Kitchen.. 1 By Mouth Qhs 4)  Amlodipine Besylate 10 Mg Tabs (Amlodipine Besylate) .Marland Kitchen.. 1 By Mouth Qhs 5)  Pravachol 10 Mg Tabs (Pravastatin Sodium) .Marland Kitchen.. 1 By Mouth Qhs 6)  Humalog 100 Unit/ml Soln (Insulin Lispro (Human)) .... Sliding Scale 7)  Omeprazole 40 Mg Cpdr (Omeprazole) .Marland Kitchen.. 1 By Mouth Qam 8)  Tramadol Hcl 50 Mg Tabs (Tramadol Hcl) .Marland Kitchen.. 1 Every 8 Hours Prn 9)  Oscal 500/200 D-3 500-200  Mg-Unit Tabs (Calcium-Vitamin D) .Marland Kitchen.. 1 By Mouth Bid 10)  Aspir-Low 81 Mg Tbec (Aspirin) .Marland Kitchen.. 1 By Mouth Qam 11)  Fish Oil 1200 Mg Caps (Omega-3 Fatty Acids) .... 2 Daily 12)  Cinnamon 500 Mg Tabs (Cinnamon) .... 4 Tabs Daily 13)  Vitamin D3 2000 Unit Caps (Cholecalciferol) .Marland Kitchen.. 1 Daily 14)  Coq-10 300 Mg Caps (Coenzyme Q10) .Marland Kitchen.. 1 Daily 15)  Vitamin C 500mg  .... 1 Qd 16)  Potassium Gluconate 595 Mg Cr-Tabs (Potassium Gluconate) .Marland Kitchen.. 1 Qd 17)  Gabapentin 300 Mg Caps (Gabapentin) .Marland Kitchen.. 1 Qhs 18)  Clindamycin Hcl 300 Mg Caps (Clindamycin Hcl) .... Take One Tablet By Mouth Four Times A Day For One Week 19)  Levaquin 500 Mg Tabs (Levofloxacin) .... Take One Tablet By Mouth Daily For One Week  Allergies (verified): 1)  ! Tekturna (Aliskiren Fumarate) 2)  ! Diltiazem Hcl Er Beads 3)  ! * Sular 4)  ! * Minoridil 5)  ! * Fenofilrate 6)  ! * Capton 7)  ! * Hctz 8)  ! * Carevedilol 9)  ! * Valturna 10)  ! * Tramadol Hcl 50 Mg  Past History:  Past Medical History: Last updated: 08/04/2009 PERIPHERAL  VASCULAR DISEASE (ICD-443.9) OSTEOARTHRITIS (ICD-715.90) HYPERCHOLESTEROLEMIA (ICD-272.0) HYPERTENSION (ICD-401.9) IDDM (ICD-250.01)  Review of Systems  The patient denies fever.         sge denies hypoglycemia  Physical Exam  General:  normal appearance.   Extremities:  left leg: there is hyperpigmentation c/w chronic venous insufficiency.   there is 2+ edema, and slight erythema and tenderness.  no warmth.  no ulcer. the right foot is normal temperature.    Impression & Recommendations:  Problem # 1:  IDDM (ICD-250.01) uncertain control  Problem # 2:  edema due to chronic venous insufficiency  Problem # 3:  mild cellulitis due to #1 and 2  Other Orders: Est. Patient Level IV ([address])  Patient Instructions: 1)  same insulin for now. 2)  return 6 weeks.  please bring you insulin bolus schedule with you. 3)  it is critically important to elevate your left leg--it needs  to be the highest point of your body.  Preventive Care Screening  Last Flu Shot:    Date:  05/20/2009    Results:  historical   Mammogram:    Date:  12/18/2008    Results:  historical

## 2010-09-19 NOTE — Progress Notes (Signed)
Summary: hydralazine  Phone Note Refill Request Message from:  Fax from Pharmacy on April 04, 2010 1:11 PM  Refills Requested: Medication #1:  HYDRALAZINE HCL 10 MG TABS 1 by mouth two times a day   Last Refilled: 02/01/2010  Method Requested: Electronic Initial call taken by: Orlan Leavens RMA,  April 04, 2010 1:12 PM    Prescriptions: HYDRALAZINE HCL 10 MG TABS (HYDRALAZINE HCL) 1 by mouth two times a day  #120 x 2   Entered by:   Orlan Leavens RMA   Authorized by:   Newt Lukes MD   Signed by:   Orlan Leavens RMA on 04/04/2010   Method used:   Electronically to        [facility]* (retail)       [address]       [place], [place]  [address]       Ph: [phone]       Fax: [phone]   RxID:   [id number]

## 2010-09-19 NOTE — Progress Notes (Signed)
Summary: pravastatin  Phone Note Refill Request Message from:  Fax from Pharmacy on May 24, 2010 1:41 PM  Refills Requested: Medication #1:  PRAVACHOL 10 MG TABS 1 by mouth at bedtime   Last Refilled: 04/25/2010  Method Requested: Electronic Initial call taken by: Orlan Leavens RMA,  May 24, 2010 1:41 PM    Prescriptions: PRAVACHOL 10 MG TABS (PRAVASTATIN SODIUM) 1 by mouth at bedtime  #30 x 4   Entered by:   Orlan Leavens RMA   Authorized by:   Newt Lukes MD   Signed by:   Orlan Leavens RMA on 05/24/2010   Method used:   Electronically to        [facility]* (retail)       [address]       [place], [place]  [address]       Ph: [phone]       Fax: [phone]   RxID:   [id number]

## 2010-09-19 NOTE — Progress Notes (Signed)
  Phone Note Call from Patient   Caller: Patient Summary of Call: Patient stopped by office with new automated bp cuff-BP checked with her cuff 155/53 HR 73; BP checked with our cuff 145/63 HR 68.  Both checked on Left arm.  Patient left list of BPs. Initial call taken by: Dessie Coma  LPN,  May 22, 2010 2:39 PM

## 2010-09-19 NOTE — Letter (Signed)
Summary: [facility]  [facility]   Imported By: Lester [place] 11/30/2009 08:22:46  _____________________________________________________________________  External Attachment:    Type:   Image     Comment:   External Document

## 2010-09-19 NOTE — Assessment & Plan Note (Signed)
Summary: right hip-leg pain/leschber/cd   Vital Signs:  Patient profile:   77 year old female Height:      65 inches Weight:      158 pounds BMI:     26.39 O2 Sat:      95 % on Room air Temp:     98.5 degrees F oral Pulse rate:   69 / minute BP sitting:   132 / 52  (left arm) Cuff size:   regular  Vitals Entered By: Zella Ball Ewing CMA Duncan Dull) (July 11, 2010 11:25 AM)  O2 Flow:  Room air CC: Right leg and hip pian, both ear stopped up/RE   Primary Care Provider:  Newt Lukes, MD  CC:  Right leg and hip pian and both ear stopped up/RE.  History of Present Illness: here with persistent c/o ongoing pain to the right inguinal area , lateral hip, buttock, and prox leg worse with getting up from chair and ambulation and initially seems to not understand her PVD situation;  I have reviewed the recent note per Dr Kirke Corin with her and she seems to understand his conclusions better I think, but also is focused on second opinions per ortho and vascular surgury regarding her pain and tx options;  Pt denies CP, worsening sob, doe, wheezing, orthopnea, pnd, worsening LE edema, palps, dizziness or syncope  Pt denies new neuro symptoms such as headache, facial or extremity weakness  Pt denies polydipsia, polyuria.  No fever, wt loss, night sweats, loss of appetite or other constitutional symptoms  No worsening confusion or recent falls or injury.  Overall good compliance with meds, and good tolerability.  Also wtih hearing loss to the right ear for about 1 wk wihtout headache, sT or sinus symtpoms.   No recent bowel or bladder changes.   Preventive Screening-Counseling & Management      Drug Use:  no.    Problems Prior to Update: 1)  Other Specified Forms of Hearing Loss  (ICD-389.8) 2)  Hip Pain, Right  (ICD-719.45) 3)  Inguinal Pain, Right  (ICD-789.09) 4)  Cervicalgia  (ICD-723.1) 5)  Hepatotoxicity, Drug-induced, Risk of  (ICD-V58.69) 6)  Candidiasis  (ICD-112.9) 7)  Cystitis   (ICD-595.9) 8)  Venous Insufficiency, Left Leg  (ICD-459.81) 9)  Peripheral Vascular Disease  (ICD-443.9) 10)  Osteoarthritis  (ICD-715.90) 11)  Hypercholesterolemia  (ICD-272.0) 12)  Hypertension  (ICD-401.9) 13)  Iddm  (ICD-250.01)  Medications Prior to Update: 1)  Clonidine Hcl 0.2 Mg Tabs (Clonidine Hcl) .Marland Kitchen.. 1 By Mouth Four Times A Day 2)  Hydralazine Hcl 25 Mg Tabs (Hydralazine Hcl) .... Take One Tablet By Mouth Two Times A Day 3)  Amlodipine Besylate 10 Mg Tabs (Amlodipine Besylate) .Marland Kitchen.. 1 By Mouth At Bedtime 4)  Pravachol 10 Mg Tabs (Pravastatin Sodium) .Marland Kitchen.. 1 By Mouth At Bedtime 5)  Humalog 100 Unit/ml Soln (Insulin Lispro (Human)) .... Sliding Scale 6)  Omeprazole 40 Mg Cpdr (Omeprazole) .Marland Kitchen.. 1 By Mouth  Every Morning 7)  Oscal 500/200 D-3 500-200 Mg-Unit Tabs (Calcium-Vitamin D) .Marland Kitchen.. 1 By Mouth Two Times A Day 8)  Aspir-Low 81 Mg Tbec (Aspirin) .... Take 2 At Bedtime 9)  Fish Oil 1200 Mg Caps (Omega-3 Fatty Acids) .... 2 Daily 10)  Cinnamon 500 Mg Tabs (Cinnamon) .... 4 Tabs Daily 11)  Vitamin D3 2000 Unit Caps (Cholecalciferol) .Marland Kitchen.. 1 Daily 12)  Coq-10 300 Mg Caps (Coenzyme Q10) .Marland Kitchen.. 1 Daily 13)  Vitamin C 500mg  .... 1 Once Daily 14)  Potassium Gluconate 595 Mg  Cr-Tabs (Potassium Gluconate) .Marland Kitchen.. 1 Once Daily 15)  Gabapentin 300 Mg Caps (Gabapentin) .... Take One Capsule By Mouth Every Night As Needed 16)  Furosemide 40 Mg Tabs (Furosemide) .... 2 Tabs  Every Morning and 1 By Mouth At 2pm 17)  One A Day Womens Multivitamin .Marland Kitchen.. 1 By Mouth Once Daily 18)  Tylenol 325 Mg Tabs (Acetaminophen) .... As Needed 19)  Aleve 220 Mg Tabs (Naproxen Sodium) .... As Needed 20)  Losartan Potassium 100 Mg Tabs (Losartan Potassium) .Marland Kitchen.. 1 Tab Once Daily 21)  Methocarbamol 500 Mg Tabs (Methocarbamol) .Marland Kitchen.. 1 Tab Three Times A Day As Needed For Cramps 22)  Nystatin 100000 Unit/gm Powd (Nystatin) .... Apply To Affected Area 3-4 Times As Needed  Current Medications (verified): 1)  Clonidine Hcl  0.2 Mg Tabs (Clonidine Hcl) .Marland Kitchen.. 1 By Mouth Four Times A Day 2)  Hydralazine Hcl 25 Mg Tabs (Hydralazine Hcl) .... Take One Tablet By Mouth Two Times A Day 3)  Amlodipine Besylate 10 Mg Tabs (Amlodipine Besylate) .Marland Kitchen.. 1 By Mouth At Bedtime 4)  Pravachol 10 Mg Tabs (Pravastatin Sodium) .Marland Kitchen.. 1 By Mouth At Bedtime 5)  Humalog 100 Unit/ml Soln (Insulin Lispro (Human)) .... Sliding Scale 6)  Omeprazole 40 Mg Cpdr (Omeprazole) .Marland Kitchen.. 1 By Mouth  Every Morning 7)  Oscal 500/200 D-3 500-200 Mg-Unit Tabs (Calcium-Vitamin D) .Marland Kitchen.. 1 By Mouth Two Times A Day 8)  Aspir-Low 81 Mg Tbec (Aspirin) .... Take 2 At Bedtime 9)  Fish Oil 1200 Mg Caps (Omega-3 Fatty Acids) .... 2 Daily 10)  Cinnamon 500 Mg Tabs (Cinnamon) .... 4 Tabs Daily 11)  Vitamin D3 2000 Unit Caps (Cholecalciferol) .Marland Kitchen.. 1 Daily 12)  Coq-10 300 Mg Caps (Coenzyme Q10) .Marland Kitchen.. 1 Daily 13)  Vitamin C 500mg  .... 1 Once Daily 14)  Potassium Gluconate 595 Mg Cr-Tabs (Potassium Gluconate) .Marland Kitchen.. 1 Once Daily 15)  Gabapentin 300 Mg Caps (Gabapentin) .... Take One Capsule By Mouth Every Night As Needed 16)  Furosemide 40 Mg Tabs (Furosemide) .... 2 Tabs  Every Morning and 1 By Mouth At 2pm 17)  One A Day Womens Multivitamin .Marland Kitchen.. 1 By Mouth Once Daily 18)  Tylenol 325 Mg Tabs (Acetaminophen) .... As Needed 19)  Aleve 220 Mg Tabs (Naproxen Sodium) .... As Needed 20)  Losartan Potassium 100 Mg Tabs (Losartan Potassium) .Marland Kitchen.. 1 Tab Once Daily 21)  Methocarbamol 500 Mg Tabs (Methocarbamol) .Marland Kitchen.. 1 Tab Three Times A Day As Needed For Cramps 22)  Nystatin 100000 Unit/gm Powd (Nystatin) .... Apply To Affected Area 3-4 Times As Needed 23)  Tramadol Hcl 50 Mg Tabs (Tramadol Hcl) .Marland Kitchen.. 1po Q 6 Hrs As Needed Pain  Allergies (verified): 1)  ! Tekturna (Aliskiren Fumarate) 2)  ! Diltiazem Hcl Er Beads 3)  ! * Sular 4)  ! * Minoridil 5)  ! * Fenofilrate 6)  ! * Capton 7)  ! * Hctz 8)  ! * Carevedilol 9)  ! * Valturna 10)  ! * Tramadol Hcl 50 Mg  Past  History:  Past Medical History: Last updated: 03/06/2010 Diabetes mellitus, type 2 hypertension dyslipidemia chronic lymphedema, LLE osteoarthritis - lumbar ddd, cervical, hip R>L PVD   MD roster:  endo -ellison cards -allen ([facility] ortho - [facility]  Past Surgical History: Last updated: 03/06/2010 R Total hip replacement - 1991, redo 1994 lumbar surg - 1999  Social History: Last updated: 07/11/2010 retired widowed 1991 here with son today, who lives with patient.   quit smoking 1987, prev [id number] Drug use-no  Social History: retired widowed 1991 here with son today, who lives with patient.   quit smoking 1987, prev 1.5ppdx25yy Drug use-no Drug Use:  no  Review of Systems       all otherwise negative per pt -    Physical Exam  General:  alert and well-developed.   Head:  normocephalic and atraumatic.   Eyes:  vision grossly intact, pupils equal, and pupils round.   Ears:  R ear normal and L ear normal.   Nose:  no external deformity and no nasal discharge.   Mouth:  no gingival abnormalities and pharynx pink and moist.   Neck:  supple and no JVD.   Lungs:  normal respiratory effort and no wheezes.   Heart:  normal rate and regular rhythm.   Abdomen:  soft, non-tender, and normal bowel sounds.   Msk:  some discomfort with right hip flexion with decreased ROM Extremities:  no edema, no erythema    Impression & Recommendations:  Problem # 1:  INGUINAL PAIN, RIGHT (ICD-789.09)  Her updated medication list for this problem includes:    Aspir-low 81 Mg Tbec (Aspirin) .Marland Kitchen... Take 2 at bedtime    Tylenol 325 Mg Tabs (Acetaminophen) .Marland Kitchen... As needed    Aleve 220 Mg Tabs (Naproxen sodium) .Marland Kitchen... As needed    Methocarbamol 500 Mg Tabs (Methocarbamol) .Marland Kitchen... 1 tab three times a day as needed for cramps    Tramadol Hcl 50 Mg Tabs (Tramadol hcl) .Marland Kitchen... 1po q 6 hrs as needed pain I suspect possibly related to right hip joint though has known buttock and prox  extremity cluadication per Dr Kirke Corin - now s/p right THA x 17 yrs, pt adamant for ortho evaluation and insists on change from Dr Ophelia Charter who did last surgury (not clear why as he also did her lumbar surgury)- ok refer to [place] [facility]. also incr the tramadol to q 6 hrs as needed   Problem # 2:  PERIPHERAL VASCULAR DISEASE (ICD-443.9)  pt equally adamant for second opinion as well with vascular surgury rregardin her PVD and claudication  - will refer, treat as above, f/u any worsening signs or symptoms , Continue all previous medications as before this visit   Orders: Vascular Clinic (Vascular)  Problem # 3:  OTHER SPECIFIED FORMS OF HEARING LOSS (ICD-389.8) for irrigation - improved  Complete Medication List: 1)  Clonidine Hcl 0.2 Mg Tabs (Clonidine hcl) .Marland Kitchen.. 1 by mouth four times a day 2)  Hydralazine Hcl 25 Mg Tabs (Hydralazine hcl) .... Take one tablet by mouth two times a day 3)  Amlodipine Besylate 10 Mg Tabs (Amlodipine besylate) .Marland Kitchen.. 1 by mouth at bedtime 4)  Pravachol 10 Mg Tabs (Pravastatin sodium) .Marland Kitchen.. 1 by mouth at bedtime 5)  Humalog 100 Unit/ml Soln (Insulin lispro (human)) .... Sliding scale 6)  Omeprazole 40 Mg Cpdr (Omeprazole) .Marland Kitchen.. 1 by mouth  every morning 7)  Oscal 500/200 D-3 500-200 Mg-unit Tabs (Calcium-vitamin d) .Marland Kitchen.. 1 by mouth two times a day 8)  Aspir-low 81 Mg Tbec (Aspirin) .... Take 2 at bedtime 9)  Fish Oil 1200 Mg Caps (Omega-3 fatty acids) .... 2 daily 10)  Cinnamon 500 Mg Tabs (Cinnamon) .... 4 tabs daily 11)  Vitamin D3 2000 Unit Caps (Cholecalciferol) .Marland Kitchen.. 1 daily 12)  Coq-10 300 Mg Caps (coenzyme Q10)  .Marland Kitchen.. 1 daily 13)  Vitamin C 500mg   .... 1 once daily 14)  Potassium Gluconate 595 Mg Cr-tabs (Potassium gluconate) .Marland Kitchen.. 1 once daily 15)  Gabapentin 300 Mg Caps (  Gabapentin) .... Take one capsule by mouth every night as needed 16)  Furosemide 40 Mg Tabs (Furosemide) .... 2 tabs  every morning and 1 by mouth at 2pm 17)  One A Day Womens Multivitamin  .Marland Kitchen.. 1 by  mouth once daily 18)  Tylenol 325 Mg Tabs (Acetaminophen) .... As needed 19)  Aleve 220 Mg Tabs (Naproxen sodium) .... As needed 20)  Losartan Potassium 100 Mg Tabs (Losartan potassium) .Marland Kitchen.. 1 tab once daily 21)  Methocarbamol 500 Mg Tabs (Methocarbamol) .Marland Kitchen.. 1 tab three times a day as needed for cramps 22)  Nystatin 100000 Unit/gm Powd (Nystatin) .... Apply to affected area 3-4 times as needed 23)  Tramadol Hcl 50 Mg Tabs (Tramadol hcl) .Marland Kitchen.. 1po q 6 hrs as needed pain  Other Orders: Orthopedic Surgeon Referral (Ortho Surgeon)  Patient Instructions: 1)  please bring this paper back between 1 and 5 PM to have both ears cleared of wax (present at front desk, to see Robin) 2)  increase the tramadol to every 6 hrs as needed pain (your prescription was sent on the computer to [facility]) 3)  You will be contacted about the referral(s) to: Vascular surgury, and [facility] 4)  Continue all previous medications as before this visit  5)  Please schedule an appointment with your primary doctor as needed Prescriptions: TRAMADOL HCL 50 MG TABS (TRAMADOL HCL) 1po q 6 hrs as needed pain  #120 x 2   Entered and Authorized by:   Corwin Levins MD   Signed by:   Corwin Levins MD on 07/11/2010   Method used:   Electronically to        [facility]* (retail)       [address]       [place], [place]  [address]       Ph: [phone]       Fax: [phone]   RxID:   [id number]    Orders Added: 1)  Orthopedic Surgeon Referral Gaylord Shih Surgeon] 2)  Vascular Clinic [Vascular] 3)  Est. Patient Level IV [84132]

## 2010-09-19 NOTE — Letter (Signed)
Summary: [facility]  [facility]   Imported By: Lester [place] 03/08/2010 08:41:00  _____________________________________________________________________  External Attachment:    Type:   Image     Comment:   External Document

## 2010-09-19 NOTE — Assessment & Plan Note (Signed)
Summary: 3 MO ROV /NWS   Vital Signs:  Patient profile:   77 year old female Height:      64 inches (162.56 cm) Weight:      157.25 pounds (71.48 kg) BMI:     27.09 O2 Sat:      94 % on Room air Temp:     97.9 degrees F (36.61 degrees C) oral Pulse rate:   65 / minute BP sitting:   138 / 72  (left arm) Cuff size:   regular  Vitals Entered By: Brenton Grills MA (May 09, 2010 10:41 AM)  O2 Flow:  Room air CC: 3 month F/U/pt is no longer taking Meloxicam or Robaxin/aj Is Patient Diabetic? Yes   Referring Provider:  Dicky Doe Primary Provider:  Newt Lukes, MD  CC:  3 month F/U/pt is no longer taking Meloxicam or Robaxin/aj.  History of Present Illness: pt states few mos of moderate cramps of the toes, and slight assoc numbness. she brings a record of her cbg's which i have reviewed today.  she has hypoglycemia approx 1-2/month, before lunch, or at hs.  it is usually higher at hs than in am, though.  most are in the 100's. she wants a cheaper alternative to diovan.  Current Medications (verified): 1)  Clonidine Hcl 0.2 Mg Tabs (Clonidine Hcl) .Marland Kitchen.. 1 By Mouth Four Times A Day 2)  Hydralazine Hcl 10 Mg Tabs (Hydralazine Hcl) .Marland Kitchen.. 1 By Mouth Two Times A Day 3)  Diovan 320 Mg Tabs (Valsartan) .Marland Kitchen.. 1 By Mouth At Bedtime 4)  Amlodipine Besylate 10 Mg Tabs (Amlodipine Besylate) .Marland Kitchen.. 1 By Mouth At Bedtime 5)  Pravachol 10 Mg Tabs (Pravastatin Sodium) .Marland Kitchen.. 1 By Mouth At Bedtime 6)  Humalog 100 Unit/ml Soln (Insulin Lispro (Human)) .... Sliding Scale 7)  Omeprazole 40 Mg Cpdr (Omeprazole) .Marland Kitchen.. 1 By Mouth  Every Morning 8)  Oscal 500/200 D-3 500-200 Mg-Unit Tabs (Calcium-Vitamin D) .Marland Kitchen.. 1 By Mouth Two Times A Day 9)  Aspir-Low 81 Mg Tbec (Aspirin) .... Take 2 At Bedtime 10)  Fish Oil 1200 Mg Caps (Omega-3 Fatty Acids) .... 2 Daily 11)  Cinnamon 500 Mg Tabs (Cinnamon) .... 4 Tabs Daily 12)  Vitamin D3 2000 Unit Caps (Cholecalciferol) .Marland Kitchen.. 1 Daily 13)  Coq-10 300 Mg  Caps (Coenzyme Q10) .Marland Kitchen.. 1 Daily 14)  Vitamin C 500mg  .... 1 Once Daily 15)  Potassium Gluconate 595 Mg Cr-Tabs (Potassium Gluconate) .Marland Kitchen.. 1 Once Daily 16)  Gabapentin 300 Mg Caps (Gabapentin) .... Take One Capsule By Mouth Every Night As Needed 17)  Furosemide 40 Mg Tabs (Furosemide) .... 2 Tabs  Every Morning and 1 By Mouth At 2pm 18)  One A Day Womens Multivitamin .Marland Kitchen.. 1 By Mouth Once Daily 19)  Meloxicam 15 Mg Tabs (Meloxicam) .Marland Kitchen.. 1 By Mouth Once Daily As Needed For Arthritis Pain 20)  Robaxin 500 Mg Tabs (Methocarbamol) .Marland Kitchen.. 1 By Mouth Three Times A Day For Muscle Spasm Pain 21)  Tylenol 325 Mg Tabs (Acetaminophen) .... As Needed 22)  Aleve 220 Mg Tabs (Naproxen Sodium) .... As Needed  Allergies (verified): 1)  ! Tekturna (Aliskiren Fumarate) 2)  ! Diltiazem Hcl Er Beads 3)  ! * Sular 4)  ! * Minoridil 5)  ! * Fenofilrate 6)  ! * Capton 7)  ! * Hctz 8)  ! * Carevedilol 9)  ! * Valturna 10)  ! * Tramadol Hcl 50 Mg  Past History:  Past Medical History: Last updated: 03/06/2010 Diabetes mellitus, type  2 hypertension dyslipidemia chronic lymphedema, LLE osteoarthritis - lumbar ddd, cervical, hip R>L PVD   MD roster:  endo -ellison cards -allen ([place] [facility]) ortho - yates  Review of Systems  The patient denies syncope.         she also has foot pain  Physical Exam  General:  normal appearance.   Pulses:  dorsalis pedis intact bilaterally Extremities:  there is hyperpigmentation c/w chronic venous insufficiency.   no deformity.  no ulcer on the feet.  feet are of normal temp.   trace right pedal edema and trace left pedal edema.   Neurologic:  sensation is intact to touch on the feet, but decreased from normal.  Additional Exam:  Hemoglobin A1C       [H]  6.7 %                       4.6-6.5       Sodium                    142 mEq/L                   135-145   Potassium                 4.5 mEq/L                   3.5-5.1   Chloride                  100 mEq/L                    96-112   Carbon Dioxide            32 mEq/L                    19-32   Glucose              [H]  137 mg/dL                   16-10   BUN                  [H]  35 mg/dL                    9-60   Creatinine                1.2 mg/dL                   4.5-4.0   Calcium              [H]  10.9 mg/dL      Impression & Recommendations:  Problem # 1:  IDDM (ICD-250.01) in my opinion, her pump schedule is too complex, espercially for someone who does not know how to adjust her own pump.    Problem # 2:  hypercalcemia uncertain etiology  Problem # 3:  cramps no electrolyte cause is found  Problem # 4:  HYPERTENSION (ICD-401.9) diovan is too expensive for her  Medications Added to Medication List This Visit: 1)  Tylenol 325 Mg Tabs (Acetaminophen) .... As needed 2)  Aleve 220 Mg Tabs (Naproxen sodium) .... As needed 3)  Losartan Potassium 100 Mg Tabs (Losartan potassium) .Marland Kitchen.. 1 tab once daily 4)  Methocarbamol 500 Mg Tabs (Methocarbamol) .Marland Kitchen.. 1 tab three times a day as needed for cramps  Other Orders:  Flu Vaccine 69yrs + MEDICARE PATIENTS (Z6109) Administration Flu vaccine - MCR (G0008) TLB-A1C / Hgb A1C (Glycohemoglobin) (83036-A1C) TLB-BMP (Basic Metabolic Panel-BMET) (80048-METABOL) Est. Patient Level IV (60454)  Patient Instructions: 1)  tests are being ordered for you today.  a few days after the test(s), please call [phone] to hear your test results. 2)  pending the test results, please change basal rate to 1 unit/hr, 24 hrs per day.  you could ask you son to help you do this, or you could call the customer service phone number.   3)  continue bolus of 15 units with each meal. 4)  continue correction bolus (which some people call "sensitivity," or "insulin sensitivity ratio," or just "isr") of 1 unit for each by 25 which your glucose exceeds 120. 5)  Please schedule a follow-up appointment in 3 months.   6)  for cramps, please take methocarbamol as prescribed by dr  Felicity Coyer. 7)  change diovan to losartan 100 mg once daily. 8)  (update: i left message on phone-tree:  rx as we discussed) Prescriptions: METHOCARBAMOL 500 MG TABS (METHOCARBAMOL) 1 tab three times a day as needed for cramps  #50 x 11   Entered and Authorized by:   Minus Breeding MD   Signed by:   Minus Breeding MD on 05/09/2010   Method used:   Electronically to        [facility] (retail)       [address]       [place], [place]  [address]       Ph: [phone]       Fax: [phone]   RxID:   [id number] LOSARTAN POTASSIUM 100 MG TABS (LOSARTAN POTASSIUM) 1 tab once daily  #30 x 11   Entered and Authorized by:   Minus Breeding MD   Signed by:   Minus Breeding MD on 05/09/2010   Method used:   Electronically to        [facility] (retail)       [address]       [place], [place]  [address]       Ph: [phone]       Fax: [phone]   RxID:   [id number]              Flu Vaccine Consent Questions     Do you have a history of severe allergic reactions to this vaccine? no    Any prior history of allergic reactions to egg and/or gelatin? no    Do you have a sensitivity to the preservative Thimersol? no    Do you have a past history of Guillan-Barre Syndrome? no    Do you currently have an acute febrile illness? no    Have you ever had a severe reaction to latex? no    Vaccine information given and explained to patient? yes    Are you currently pregnant? no    Lot Number:[id number]   Exp Date:02/17/2011   Site Given  Left Deltoid IMlu

## 2010-09-19 NOTE — Progress Notes (Signed)
Summary: Rx req  Phone Note Call from Patient Call back at Home Phone [phone]   Caller: Patient Summary of Call: pt called requesting Rx for Diflucan to pharmacy Initial call taken by: Margaret Pyle, CMA,  Jan 11, 2010 4:42 PM  Follow-up for Phone Call        erx done Follow-up by: Newt Lukes MD,  Jan 11, 2010 4:50 PM  Additional Follow-up for Phone Call Additional follow up Details #1::        Pt informed  Additional Follow-up by: Lamar Sprinkles, CMA,  Jan 11, 2010 6:32 PM    New/Updated Medications: DIFLUCAN 150 MG TABS (FLUCONAZOLE) 1 by mouth x 1, may repeat daily if needed Prescriptions: DIFLUCAN 150 MG TABS (FLUCONAZOLE) 1 by mouth x 1, may repeat daily if needed  #2 x 1   Entered and Authorized by:   Newt Lukes MD   Signed by:   Newt Lukes MD on 01/11/2010   Method used:   Electronically to        [facility] (retail)       [address]       [place], [place]  [address]       Ph: [phone]       Fax: [phone]   RxID:   [id number]

## 2010-09-19 NOTE — Assessment & Plan Note (Signed)
Summary: BACK PAIN  STC   Vital Signs:  Patient profile:   77 year old female Height:      64 inches (162.56 cm) Weight:      155.4 pounds (70.64 kg) O2 Sat:      94 % on Room air Temp:     98.2 degrees F (36.78 degrees C) oral Pulse rate:   69 / minute BP sitting:   120 / 58  (left arm) Cuff size:   regular  Vitals Entered By: Orlan Leavens (February 28, 2010 11:09 AM)  O2 Flow:  Room air CC: (L) Back & Neck pain, Back pain Is Patient Diabetic? Yes Did you bring your meter with you today? No Pain Assessment Patient in pain? yes     Location: lower back and Neck Type: aching   Primary Care Provider:  Newt Lukes, MD  CC:  (L) Back & Neck pain and Back pain.  History of Present Illness:  Back Pain/neck pain      This is an 77 year old woman who presents with Back and neck pain.  The symptoms began >1 year ago.  The intensity is described as moderate.  long hx back pain - surg for same 1999 (yates -not helpful) and injections in back x 2 (ramos - not helpful) - also with left neck pain x 3 months - no radiation into arms or hands - no falls or precipitating problems.  The patient denies fever, chills, weakness, loss of sensation, urinary incontinence, and urinary retention.  The pain is located in the mid low back and left neck.  The pain began gradually.  The pain is made worse by extension and activity.  The pain is made better by inactivity, NSAID medications, and sitting or bending forward.    Current Medications (verified): 1)  Clonidine Hcl 0.2 Mg Tabs (Clonidine Hcl) .Marland Kitchen.. 1 By Mouth Four Times A Day 2)  Hydralazine Hcl 10 Mg Tabs (Hydralazine Hcl) .Marland Kitchen.. 1 By Mouth Two Times A Day 3)  Diovan 320 Mg Tabs (Valsartan) .Marland Kitchen.. 1 By Mouth At Bedtime 4)  Amlodipine Besylate 10 Mg Tabs (Amlodipine Besylate) .Marland Kitchen.. 1 By Mouth At Bedtime 5)  Pravachol 10 Mg Tabs (Pravastatin Sodium) .Marland Kitchen.. 1 By Mouth At Bedtime 6)  Humalog 100 Unit/ml Soln (Insulin Lispro (Human)) .... Sliding Scale 7)   Omeprazole 40 Mg Cpdr (Omeprazole) .Marland Kitchen.. 1 By Mouth  Every Morning 8)  Oscal 500/200 D-3 500-200 Mg-Unit Tabs (Calcium-Vitamin D) .Marland Kitchen.. 1 By Mouth Two Times A Day 9)  Aspir-Low 81 Mg Tbec (Aspirin) .... Take 2 At Bedtime 10)  Fish Oil 1200 Mg Caps (Omega-3 Fatty Acids) .... 2 Daily 11)  Cinnamon 500 Mg Tabs (Cinnamon) .... 4 Tabs Daily 12)  Vitamin D3 2000 Unit Caps (Cholecalciferol) .Marland Kitchen.. 1 Daily 13)  Coq-10 300 Mg Caps (Coenzyme Q10) .Marland Kitchen.. 1 Daily 14)  Vitamin C 500mg  .... 1 Once Daily 15)  Potassium Gluconate 595 Mg Cr-Tabs (Potassium Gluconate) .Marland Kitchen.. 1 Once Daily 16)  Gabapentin 300 Mg Caps (Gabapentin) .... Take One Capsule By Mouth Every Night As Needed 17)  Furosemide 40 Mg Tabs (Furosemide) .... 2 Tabs  Every Morning and 1 By Mouth At 2pm 18)  One A Day Womens Multivitamin .Marland Kitchen.. 1 By Mouth Once Daily  Allergies (verified): 1)  ! Tekturna (Aliskiren Fumarate) 2)  ! Diltiazem Hcl Er Beads 3)  ! * Sular 4)  ! * Minoridil 5)  ! * Fenofilrate 6)  ! * Capton 7)  ! *  Hctz 8)  ! * Carevedilol 9)  ! * Valturna 10)  ! * Tramadol Hcl 50 Mg  Past History:  Past Medical History: Diabetes mellitus, type 2 hypertension dyslipidemia chronic lymphedema, LLE osteoarthritis - lumbar ddd, cervical PVD   MD roster:  endo -ellison cards -allen ([place] [facility]) ortho - yates  Review of Systems  The patient denies fever, dyspnea on exertion, headaches, and depression.    Physical Exam  General:  alert, well-developed, well-nourished, and cooperative to examination.   son at side Lungs:  normal respiratory effort, no intercostal retractions or use of accessory muscles; normal breath sounds bilaterally - no crackles and no wheezes.    Heart:  normal rate, regular rhythm, no murmur, and no rub. BLE without edema. Msk:  kyphosis noted - neck with paraspinal tightness/spasm left>right - FROM - normal balance and gait - full strength and sensation BUE -  back: full range of motion of lumbar  spine. Nontender to palpation. Negative straight leg raise. Deep tendon reflexes symmetrically intact. Sensation intact throughout all dermatomes in bilateral lower extremities. Full strength to manual muscle testing in all major muscule groups. Able to heel and toe walk without difficulty and ambulates with a normal gait.    Impression & Recommendations:  Problem # 1:  CERVICALGIA (ICD-723.1) suspect DDD given hx lumbar ddd - check xray now -  change to rx antiinflamm as pt does not wish to take narcotics (doesn't want to get hooked) add muscle relaxants -  ?need neuro surg or spine surg eval for same - consider based on xray and response to med tx -  consider PT refer The following medications were removed from the medication list:    Hydrocodone-acetaminophen 5-325 Mg Tabs (Hydrocodone-acetaminophen) .Marland Kitchen... 1 by mouth two times a day as needed for moderate to severe arthritis pain Her updated medication list for this problem includes:    Aspir-low 81 Mg Tbec (Aspirin) .Marland Kitchen... Take 2 at bedtime    Meloxicam 15 Mg Tabs (Meloxicam) .Marland Kitchen... 1 by mouth once daily as needed for arthritis pain    Robaxin 500 Mg Tabs (Methocarbamol) .Marland Kitchen... 1 by mouth three times a day for muscle spasm pain  Orders: Prescription Created Electronically [phone]) T-Cervicle Spine 2-3 Views (72040TC)  Complete Medication List: 1)  Clonidine Hcl 0.2 Mg Tabs (Clonidine hcl) .Marland Kitchen.. 1 by mouth four times a day 2)  Hydralazine Hcl 10 Mg Tabs (Hydralazine hcl) .Marland Kitchen.. 1 by mouth two times a day 3)  Diovan 320 Mg Tabs (Valsartan) .Marland Kitchen.. 1 by mouth at bedtime 4)  Amlodipine Besylate 10 Mg Tabs (Amlodipine besylate) .Marland Kitchen.. 1 by mouth at bedtime 5)  Pravachol 10 Mg Tabs (Pravastatin sodium) .Marland Kitchen.. 1 by mouth at bedtime 6)  Humalog 100 Unit/ml Soln (Insulin lispro (human)) .... Sliding scale 7)  Omeprazole 40 Mg Cpdr (Omeprazole) .Marland Kitchen.. 1 by mouth  every morning 8)  Oscal 500/200 D-3 500-200 Mg-unit Tabs (Calcium-vitamin d) .Marland Kitchen.. 1 by mouth  two times a day 9)  Aspir-low 81 Mg Tbec (Aspirin) .... Take 2 at bedtime 10)  Fish Oil 1200 Mg Caps (Omega-3 fatty acids) .... 2 daily 11)  Cinnamon 500 Mg Tabs (Cinnamon) .... 4 tabs daily 12)  Vitamin D3 2000 Unit Caps (Cholecalciferol) .Marland Kitchen.. 1 daily 13)  Coq-10 300 Mg Caps (coenzyme Q10)  .Marland Kitchen.. 1 daily 14)  Vitamin C 500mg   .... 1 once daily 15)  Potassium Gluconate 595 Mg Cr-tabs (Potassium gluconate) .Marland Kitchen.. 1 once daily 16)  Gabapentin 300 Mg Caps (Gabapentin) .Marland KitchenMarland KitchenMarland Kitchen  Take one capsule by mouth every night as needed 17)  Furosemide 40 Mg Tabs (Furosemide) .... 2 tabs  every morning and 1 by mouth at 2pm 18)  One A Day Womens Multivitamin  .Marland Kitchen.. 1 by mouth once daily 19)  Meloxicam 15 Mg Tabs (Meloxicam) .Marland Kitchen.. 1 by mouth once daily as needed for arthritis pain 20)  Robaxin 500 Mg Tabs (Methocarbamol) .Marland Kitchen.. 1 by mouth three times a day for muscle spasm pain  Patient Instructions: 1)  it was good to see you today. 2)  xray neck ordered today - your results will be called to you 3)  meloxicam for pain and robaxin for muscle spams - can use together if needed - your prescriptions have been electronically submitted to your pharmacy. Please take as directed. Contact our office if you believe you're having problems with the medication(s).   4)  Please schedule a follow-up appointment when you hear from [employer] about your application, call sooner if problems.  Prescriptions: ROBAXIN 500 MG TABS (METHOCARBAMOL) 1 by mouth three times a day for muscle spasm pain  #60 x 1   Entered and Authorized by:   Newt Lukes MD   Signed by:   Newt Lukes MD on 02/28/2010   Method used:   Electronically to        [facility] (retail)       [address]       [place], [place]  [address]       Ph: [phone]       Fax: [phone]   RxID:   [id number] MELOXICAM 15 MG TABS (MELOXICAM) 1 by mouth once daily as needed for arthritis pain  #30 x 2   Entered and Authorized  by:   Newt Lukes MD   Signed by:   Newt Lukes MD on 02/28/2010   Method used:   Electronically to        [facility] (retail)       [address]       [place], [place]  [address]       Ph: [phone]       Fax: [phone]   RxID:   [id number]

## 2010-09-19 NOTE — Assessment & Plan Note (Signed)
Summary: 3-4 mth fu--stc   Vital Signs:  Patient profile:   77 year old female Height:      64 inches (162.56 cm) Weight:      159.0 pounds (72.27 kg) O2 Sat:      93 % on Room air Temp:     98.0 degrees F (36.67 degrees C) oral Pulse rate:   71 / minute BP sitting:   122 / 50  (left arm) Cuff size:   regular  Vitals Entered By: Orlan Leavens (February 03, 2010 10:44 AM)  O2 Flow:  Room air CC: 3 month follow-up/ Pt req refill on clonidine & amloipine. Also want rs for hydrocodone for her back pain Is Patient Diabetic? Yes Did you bring your meter with you today? No Pain Assessment Patient in pain? yes     Location: lower back Type: aching   Primary Care Provider:  Newt Lukes, MD  CC:  3 month follow-up/ Pt req refill on clonidine & amloipine. Also want rs for hydrocodone for her back pain.  History of Present Illness: here for f/u  1) DM2 - reports compliance with ongoing medical treatment and no changes in medication dose or frequency. denies adverse side effects related to current therapy. denies hypoglycemia symptoms - follows with endo for same  2) dyslipidemia - reports compliance with ongoing medical treatment and no changes in medication dose or frequency. denies adverse side effects related to current therapy. no GI or muscle SE or c/o  3) HTN - reports compliance with ongoing medical treatment and no changes in medication dose or frequency. denies adverse side effects related to current therapy. no HA or CP - needs refill on hydralazine  4) chronic LBP - despite surg and prior steroid injections, daily pain -  takes hydrocodone 5/325 qam and tylenol at bedtime for same - no weakness in legs or recent falls  Clinical Review Panels:  Diabetes Management   HgBA1C:  7.0 (11/04/2009)   Creatinine:  1.1 (10/05/2009)   Last Flu Vaccine:  historical (05/20/2009)  Complete Metabolic Panel   Glucose:  197 (10/05/2009)   Sodium:  140 (10/05/2009)   Potassium:  4.2  (10/05/2009)   Chloride:  106 (10/05/2009)   BUN:  38 (10/05/2009)   Creatinine:  1.1 (10/05/2009)   Current Medications (verified): 1)  Clonidine Hcl 0.2 Mg Tabs (Clonidine Hcl) .Marland Kitchen.. 1 By Mouth Four Times A Day 2)  Hydralazine Hcl 10 Mg Tabs (Hydralazine Hcl) .Marland Kitchen.. 1 By Mouth Four Times A Day 3)  Diovan 320 Mg Tabs (Valsartan) .Marland Kitchen.. 1 By Mouth At Bedtime 4)  Amlodipine Besylate 10 Mg Tabs (Amlodipine Besylate) .Marland Kitchen.. 1 By Mouth At Bedtime 5)  Pravachol 10 Mg Tabs (Pravastatin Sodium) .Marland Kitchen.. 1 By Mouth At Bedtime 6)  Humalog 100 Unit/ml Soln (Insulin Lispro (Human)) .... Sliding Scale 7)  Omeprazole 40 Mg Cpdr (Omeprazole) .Marland Kitchen.. 1 By Mouth  Every Morning 8)  Oscal 500/200 D-3 500-200 Mg-Unit Tabs (Calcium-Vitamin D) .Marland Kitchen.. 1 By Mouth Two Times A Day 9)  Aspir-Low 81 Mg Tbec (Aspirin) .Marland Kitchen.. 1 By Mouth  Every Morning 10)  Fish Oil 1200 Mg Caps (Omega-3 Fatty Acids) .... 2 Daily 11)  Cinnamon 500 Mg Tabs (Cinnamon) .... 4 Tabs Daily 12)  Vitamin D3 2000 Unit Caps (Cholecalciferol) .Marland Kitchen.. 1 Daily 13)  Coq-10 300 Mg Caps (Coenzyme Q10) .Marland Kitchen.. 1 Daily 14)  Vitamin C 500mg  .... 1 Once Daily 15)  Potassium Gluconate 595 Mg Cr-Tabs (Potassium Gluconate) .Marland Kitchen.. 1 Once Daily 16)  Gabapentin 300 Mg Caps (Gabapentin) .... Take One Capsule By Mouth Every Night As Needed 17)  Furosemide 40 Mg Tabs (Furosemide) .... 2 Tabs  Every Morning and 1 By Mouth At 2pm 18)  One A Day Womens Multivitamin .Marland Kitchen.. 1 By Mouth Once Daily 19)  Furosemide 40 Mg Tabs (Furosemide) .... 2 Tablets At Breakfast  Allergies (verified): 1)  ! Tekturna (Aliskiren Fumarate) 2)  ! Diltiazem Hcl Er Beads 3)  ! * Sular 4)  ! * Minoridil 5)  ! * Fenofilrate 6)  ! * Capton 7)  ! * Hctz 8)  ! * Carevedilol 9)  ! * Valturna 10)  ! * Tramadol Hcl 50 Mg  Past History:  Past Medical History: Diabetes mellitus, type 2 hypertension dyslipidemia chronic lymphedema, LLE osteoarthritis - lumbar ddd PVD  MD roster: endo -ellison cards -allen  ([facility]) ortho - yates  Review of Systems  The patient denies weight loss, headaches, and abdominal pain.    Physical Exam  General:  alert, well-developed, well-nourished, and cooperative to examination.   son at side Lungs:  normal respiratory effort, no intercostal retractions or use of accessory muscles; normal breath sounds bilaterally - no crackles and no wheezes.    Heart:  normal rate, regular rhythm, no murmur, and no rub. BLE without edema.   Impression & Recommendations:  Problem # 1:  OSTEOARTHRITIS (ICD-715.90)  includes chronic LBP -  long discussion re: risk and benefit of chronic narcotics for same - pt agrees to use sparingly for bad days  Her updated medication list for this problem includes:    Aspir-low 81 Mg Tbec (Aspirin) .Marland Kitchen... 1 by mouth  every morning    Hydrocodone-acetaminophen 5-325 Mg Tabs (Hydrocodone-acetaminophen) .Marland Kitchen... 1 by mouth two times a day as needed for moderate to severe arthritis pain  Discussed use of medications, application of heat or cold, and exercises.   Problem # 2:  HYPERCHOLESTEROLEMIA (ICD-272.0) check FLP and LFTs now -  Her updated medication list for this problem includes:    Pravachol 10 Mg Tabs (Pravastatin sodium) .Marland Kitchen... 1 by mouth at bedtime  Orders: TLB-Lipid Panel (80061-LIPID)  Problem # 3:  HYPERTENSION (ICD-401.9)  Her updated medication list for this problem includes:    Clonidine Hcl 0.2 Mg Tabs (Clonidine hcl) .Marland Kitchen... 1 by mouth four times a day    Hydralazine Hcl 10 Mg Tabs (Hydralazine hcl) .Marland Kitchen... 1 by mouth four times a day    Diovan 320 Mg Tabs (Valsartan) .Marland Kitchen... 1 by mouth at bedtime    Amlodipine Besylate 10 Mg Tabs (Amlodipine besylate) .Marland Kitchen... 1 by mouth at bedtime    Furosemide 40 Mg Tabs (Furosemide) .Marland Kitchen... 2 tabs  every morning and 1 by mouth at 2pm    Furosemide 40 Mg Tabs (Furosemide) .Marland Kitchen... 2 tablets at breakfast  BP today: 122/50 Prior BP: 156/60 (11/28/2009)  Labs Reviewed: K+: 4.2  (10/05/2009) Creat: : 1.1 (10/05/2009)     Problem # 4:  IDDM (ICD-250.01)  per endo - Her updated medication list for this problem includes:    Diovan 320 Mg Tabs (Valsartan) .Marland Kitchen... 1 by mouth at bedtime    Humalog 100 Unit/ml Soln (Insulin lispro (human)) ..... Sliding scale    Aspir-low 81 Mg Tbec (Aspirin) .Marland Kitchen... 1 by mouth  every morning  Labs Reviewed: Creat: 1.1 (10/05/2009)    Reviewed HgBA1c results: 7.0 (11/04/2009)  6.9 (08/08/2009)  Complete Medication List: 1)  Clonidine Hcl 0.2 Mg Tabs (Clonidine hcl) .Marland Kitchen.. 1 by mouth four  times a day 2)  Hydralazine Hcl 10 Mg Tabs (Hydralazine hcl) .Marland Kitchen.. 1 by mouth four times a day 3)  Diovan 320 Mg Tabs (Valsartan) .Marland Kitchen.. 1 by mouth at bedtime 4)  Amlodipine Besylate 10 Mg Tabs (Amlodipine besylate) .Marland Kitchen.. 1 by mouth at bedtime 5)  Pravachol 10 Mg Tabs (Pravastatin sodium) .Marland Kitchen.. 1 by mouth at bedtime 6)  Humalog 100 Unit/ml Soln (Insulin lispro (human)) .... Sliding scale 7)  Omeprazole 40 Mg Cpdr (Omeprazole) .Marland Kitchen.. 1 by mouth  every morning 8)  Oscal 500/200 D-3 500-200 Mg-unit Tabs (Calcium-vitamin d) .Marland Kitchen.. 1 by mouth two times a day 9)  Aspir-low 81 Mg Tbec (Aspirin) .Marland Kitchen.. 1 by mouth  every morning 10)  Fish Oil 1200 Mg Caps (Omega-3 fatty acids) .... 2 daily 11)  Cinnamon 500 Mg Tabs (Cinnamon) .... 4 tabs daily 12)  Vitamin D3 2000 Unit Caps (Cholecalciferol) .Marland Kitchen.. 1 daily 13)  Coq-10 300 Mg Caps (coenzyme Q10)  .Marland Kitchen.. 1 daily 14)  Vitamin C 500mg   .... 1 once daily 15)  Potassium Gluconate 595 Mg Cr-tabs (Potassium gluconate) .Marland Kitchen.. 1 once daily 16)  Gabapentin 300 Mg Caps (Gabapentin) .... Take one capsule by mouth every night as needed 17)  Furosemide 40 Mg Tabs (Furosemide) .... 2 tabs  every morning and 1 by mouth at 2pm 18)  One A Day Womens Multivitamin  .Marland Kitchen.. 1 by mouth once daily 19)  Furosemide 40 Mg Tabs (Furosemide) .... 2 tablets at breakfast 20)  Hydrocodone-acetaminophen 5-325 Mg Tabs (Hydrocodone-acetaminophen) .Marland Kitchen.. 1 by mouth  two times a day as needed for moderate to severe arthritis pain  Other Orders: TLB-Hepatic/Liver Function Pnl ([id number]-HEPATIC)  Patient Instructions: 1)  it was good to see you today.  2)  prescription for your back/arthritis pain as discussed - use plain tylenol two times a day when pain not severe 3)  no other medication changes 4)  test(s) ordered today - your results will be posted on the phone tree for review in 48-72 hours from the time of test completion; call [phone] and enter your 9 digit MRN (listed above on this page, just below your name); if any changes need to be made or there are abnormal results, you will be contacted directly.  5)  Please schedule follow-up appointment in 6 months, call sooner if problems.  Prescriptions: HYDROCODONE-ACETAMINOPHEN 5-325 MG TABS (HYDROCODONE-ACETAMINOPHEN) 1 by mouth two times a day as needed for moderate to severe arthritis pain  #45 x 1   Entered and Authorized by:   Newt Lukes MD   Signed by:   Newt Lukes MD on 02/03/2010   Method used:   Print then Give to Patient   RxID:   [id number] AMLODIPINE BESYLATE 10 MG TABS (AMLODIPINE BESYLATE) 1 by mouth at bedtime  #90 x 1   Entered by:   Orlan Leavens   Authorized by:   Newt Lukes MD   Signed by:   Orlan Leavens on 02/03/2010   Method used:   Electronically to        [facility]* (retail)       [address]       [place], [place]  [address]       Ph: [phone]       Fax: [phone]   RxID:   [id number] CLONIDINE HCL 0.2 MG TABS (CLONIDINE HCL) 1 by mouth four times a day  #360 x 1   Entered by:  Orlan Leavens   Authorized by:   Newt Lukes MD   Signed by:   Orlan Leavens on 02/03/2010   Method used:   Electronically to        [facility] (retail)       [address]       [place], [place]  [address]       Ph: [phone]       Fax: [phone]   RxID:   [id number]

## 2010-09-19 NOTE — Assessment & Plan Note (Signed)
Summary: 3 MO ROV /SEEING DR LESCHBER AT 9:30 /NWS   Vital Signs:  Patient profile:   77 year old female Height:      64 inches (162.56 cm) Weight:      163.50 pounds (74.32 kg) O2 Sat:      95 % on Room air Temp:     96.7 degrees F (35.94 degrees C) oral Pulse rate:   82 / minute BP sitting:   120 / 60  (left arm) Cuff size:   regular  Vitals Entered By: Josph Macho RMA (November 04, 2009 10:01 AM)  O2 Flow:  Room air CC: 3 month follow up/ pt son states pt is no longer taking Levaquin and Gabapentin is on med list twice/CF Is Patient Diabetic? Yes   Referring Provider:  Dicky Doe Primary Provider:  Ardyth Gal Rosalita Levan)  CC:  3 month follow up/ pt son states pt is no longer taking Levaquin and Gabapentin is on med list twice/CF.  History of Present Illness: pt states she feels well in general.  she brings a record of her cbg's which i have reviewed today.  it varies from 105-231, with no trend throughout the day.    Current Medications (verified): 1)  Clonidine Hcl 0.2 Mg Tabs (Clonidine Hcl) .Marland Kitchen.. 1 By Mouth Four Times A Day 2)  Hydralazine Hcl 10 Mg Tabs (Hydralazine Hcl) .Marland Kitchen.. 1 By Mouth Four Times A Day 3)  Diovan 320 Mg Tabs (Valsartan) .Marland Kitchen.. 1 By Mouth At Bedtime 4)  Amlodipine Besylate 10 Mg Tabs (Amlodipine Besylate) .Marland Kitchen.. 1 By Mouth At Bedtime 5)  Pravachol 10 Mg Tabs (Pravastatin Sodium) .Marland Kitchen.. 1 By Mouth At Bedtime 6)  Humalog 100 Unit/ml Soln (Insulin Lispro (Human)) .... Sliding Scale 7)  Omeprazole 40 Mg Cpdr (Omeprazole) .Marland Kitchen.. 1 By Mouth  Every Morning 8)  Oscal 500/200 D-3 500-200 Mg-Unit Tabs (Calcium-Vitamin D) .Marland Kitchen.. 1 By Mouth Two Times A Day 9)  Aspir-Low 81 Mg Tbec (Aspirin) .Marland Kitchen.. 1 By Mouth  Every Morning 10)  Fish Oil 1200 Mg Caps (Omega-3 Fatty Acids) .... 2 Daily 11)  Cinnamon 500 Mg Tabs (Cinnamon) .... 4 Tabs Daily 12)  Vitamin D3 2000 Unit Caps (Cholecalciferol) .Marland Kitchen.. 1 Daily 13)  Coq-10 300 Mg Caps (Coenzyme Q10) .Marland Kitchen.. 1 Daily 14)  Vitamin C 500mg   .... 1 Once Daily 15)  Potassium Gluconate 595 Mg Cr-Tabs (Potassium Gluconate) .Marland Kitchen.. 1 Once Daily 16)  Gabapentin 300 Mg Caps (Gabapentin) .... Take One Capsule By Mouth Every Night As Needed 17)  Furosemide 40 Mg Tabs (Furosemide) .... 2 Tabs  Every Morning and 1 By Mouth At 2pm 18)  One A Day Womens Multivitamin .Marland Kitchen.. 1 By Mouth Once Daily 19)  Furosemide 40 Mg Tabs (Furosemide) .... 2 Tablets At Breakfast  Allergies (verified): 1)  ! Tekturna (Aliskiren Fumarate) 2)  ! Diltiazem Hcl Er Beads 3)  ! * Sular 4)  ! * Minoridil 5)  ! * Fenofilrate 6)  ! * Capton 7)  ! * Hctz 8)  ! * Carevedilol 9)  ! * Valturna 10)  ! * Tramadol Hcl 50 Mg  Past History:  Past Medical History: Last updated: 08/04/2009 PERIPHERAL VASCULAR DISEASE (ICD-443.9) OSTEOARTHRITIS (ICD-715.90) HYPERCHOLESTEROLEMIA (ICD-272.0) HYPERTENSION (ICD-401.9) IDDM (ICD-250.01)  Review of Systems  The patient denies hypoglycemia.    Physical Exam  General:  elderly, frail, no distress  Pulses:  dorsalis pedis intact on the right, and absent on the left.   Extremities:  left leg: there is hyperpigmentation c/w chronic  venous insufficiency.   there is 1+ edema, and slight erythema and tenderness.  no warmth.  no ulcer. the right foot is normal temperature.  Neurologic:  sensation is intact to touch on the feet   Additional Exam:  Hemoglobin A1C       [H]  7.0 %     Impression & Recommendations:  Problem # 1:  IDDM (ICD-250.01) i am concerned about her risk for hypoglycemia.  i have advised her to consider simplifying her regimen, or at least trying a continuous glucose monitor.  Medications Added to Medication List This Visit: 1)  Furosemide 40 Mg Tabs (Furosemide) .... 2 tablets at breakfast  Other Orders: TLB-A1C / Hgb A1C (Glycohemoglobin) (83036-A1C) Est. Patient Level III ([address])  Patient Instructions: 1)  tests are being ordered for you today.  a few days after the test(s), please call  [phone] to hear your test results. 2)  pending the test results, please continue basal rate of 1.05 units/hr, midnight-4 am, then 1.3 units/hr, 4 am to 8 am, then 1.5 units/hr, 8 am-9 pm.  then 0.95 units/hr, 9 pm-midnight. 3)  continue bolus of 15 units with each meal. 4)  continue correction bolus (which some people call "sensitivity," or "insulin sensitivity ratio," or just "isr") of 1 unit for each by 25 which your glucose exceeds 100. 5)  Please schedule a follow-up appointment in 3 months.

## 2010-09-19 NOTE — Progress Notes (Signed)
----   Converted from flag ---- ---- 05/16/2010 3:10 PM, Marilynne Halsted, CMA, AAMA wrote: Pt has [employer] MCR, no precert required.  ---- 05/16/2010 2:34 PM, Dessie Coma  LPN wrote: Lower Arterial Doppler with ABIs scheduled for 05/26/10 at [facility]. Dx: 440.21/443.9.  Thanks, Victorino Dike ------------------------------

## 2010-09-19 NOTE — Progress Notes (Signed)
  Phone Note Outgoing Call   Call placed by: Jennifer Toothman LPN Call placed to: Patient Summary of Call: Patient notified per Dr. Darrious Youman, Echo showed normal heart function.    

## 2010-09-19 NOTE — Miscellaneous (Signed)
Summary: [facility]   Imported By: Lester [place] 11/11/2009 09:01:57  _____________________________________________________________________  External Attachment:    Type:   Image     Comment:   External Document

## 2010-09-19 NOTE — Assessment & Plan Note (Signed)
Summary: 3 mth fu--stc   Vital Signs:  Patient profile:   77 year old female Height:      64 inches Weight:      159.25 pounds BMI:     27.43 O2 Sat:      93 % on Room air Temp:     98.0 degrees F oral Pulse rate:   71 / minute BP sitting:   122 / 50  (left arm) Cuff size:   regular  Vitals Entered By: Margaret Pyle, CMA (February 03, 2010 9:36 AM)  O2 Flow:  Room air CC: 3 mth F/U - DM Is Patient Diabetic? Yes Did you bring your meter with you today? No   Referring Provider:  Dicky Doe Primary Provider:  Newt Lukes, MD  CC:  3 mth F/U - DM.  History of Present Illness: she brings a record of her cbg's which i have reviewed today.  it varies from 92 (hs) to 160 (am).  she has mild hypoglycemia approx 1/month (in the afternoon, or at hs).   she takes approx 85 units humalog per day, via her pump.  she takes a correction bolus of approx 3 units for each 50 by which her cbg exceeds 120.    Current Medications (verified): 1)  Clonidine Hcl 0.2 Mg Tabs (Clonidine Hcl) .Marland Kitchen.. 1 By Mouth Four Times A Day 2)  Hydralazine Hcl 10 Mg Tabs (Hydralazine Hcl) .Marland Kitchen.. 1 By Mouth Four Times A Day 3)  Diovan 320 Mg Tabs (Valsartan) .Marland Kitchen.. 1 By Mouth At Bedtime 4)  Amlodipine Besylate 10 Mg Tabs (Amlodipine Besylate) .Marland Kitchen.. 1 By Mouth At Bedtime 5)  Pravachol 10 Mg Tabs (Pravastatin Sodium) .Marland Kitchen.. 1 By Mouth At Bedtime 6)  Humalog 100 Unit/ml Soln (Insulin Lispro (Human)) .... Sliding Scale 7)  Omeprazole 40 Mg Cpdr (Omeprazole) .Marland Kitchen.. 1 By Mouth  Every Morning 8)  Oscal 500/200 D-3 500-200 Mg-Unit Tabs (Calcium-Vitamin D) .Marland Kitchen.. 1 By Mouth Two Times A Day 9)  Aspir-Low 81 Mg Tbec (Aspirin) .Marland Kitchen.. 1 By Mouth  Every Morning 10)  Fish Oil 1200 Mg Caps (Omega-3 Fatty Acids) .... 2 Daily 11)  Cinnamon 500 Mg Tabs (Cinnamon) .... 4 Tabs Daily 12)  Vitamin D3 2000 Unit Caps (Cholecalciferol) .Marland Kitchen.. 1 Daily 13)  Coq-10 300 Mg Caps (Coenzyme Q10) .Marland Kitchen.. 1 Daily 14)  Vitamin C 500mg  .... 1 Once  Daily 15)  Potassium Gluconate 595 Mg Cr-Tabs (Potassium Gluconate) .Marland Kitchen.. 1 Once Daily 16)  Gabapentin 300 Mg Caps (Gabapentin) .... Take One Capsule By Mouth Every Night As Needed 17)  Furosemide 40 Mg Tabs (Furosemide) .... 2 Tabs  Every Morning and 1 By Mouth At 2pm 18)  One A Day Womens Multivitamin .Marland Kitchen.. 1 By Mouth Once Daily 19)  Furosemide 40 Mg Tabs (Furosemide) .... 2 Tablets At Breakfast 20)  Nystatin 100000 Unit/gm Powd (Nystatin) .... Apply 3-4 Tiimes Per Day To Affected Skin As Directed 21)  Diflucan 150 Mg Tabs (Fluconazole) .Marland Kitchen.. 1 By Mouth X 1, May Repeat Daily If Needed  Allergies (verified): 1)  ! Tekturna (Aliskiren Fumarate) 2)  ! Diltiazem Hcl Er Beads 3)  ! * Sular 4)  ! * Minoridil 5)  ! * Fenofilrate 6)  ! * Capton 7)  ! * Hctz 8)  ! * Carevedilol 9)  ! * Valturna 10)  ! * Tramadol Hcl 50 Mg  Past History:  Past Medical History: Last updated: 11/28/2009 Diabetes mellitus, type 2 hypertension dyslipidemia chronic lymphedema, LLE osteoarthritis PVD  MD  rooster: endo -Melba Araki cards -allen ([facility])  Review of Systems  The patient denies syncope.    Physical Exam  General:  normal appearance.   Pulses:  dorsalis pedis intact bilaterally Extremities:  left leg: there is hyperpigmentation c/w chronic venous insufficiency.   there is 1+ edema, and slight erythema and tenderness.  no warmth.  no ulcer. the right foot is normal temperature.  Neurologic:  sensation is intact to touch on the feet   Additional Exam:   Hemoglobin A1C       [H]  6.9 %    Impression & Recommendations:  Problem # 1:  IDDM (ICD-250.01) overcontrolled  Other Orders: TLB-A1C / Hgb A1C (Glycohemoglobin) (83036-A1C) Est. Patient Level III ([address])  Patient Instructions: 1)  tests are being ordered for you today.  a few days after the test(s), please call [phone] to hear your test results. 2)  pending the test results, please continue basal rate of 1.05 units/hr,  midnight-4 am, then 1.3 units/hr, 4 am to 8 am, then 1.5 units/hr, 8 am-9 pm.  then 0.95 units/hr, 9 pm-midnight. 3)  continue bolus of 15 units with each meal. 4)  continue correction bolus (which some people call "sensitivity," or "insulin sensitivity ratio," or just "isr") of 1 unit for each by 25 which your glucose exceeds 120. 5)  Please schedule a follow-up appointment in 3 months. 6)  (update: i left message on phone-tree:  i gave results.  clarification:  this correction bolus is a reducetion, not a continuation).

## 2010-09-19 NOTE — Assessment & Plan Note (Signed)
Summary: hoveround papers/cd   Vital Signs:  Patient profile:   77 year old female Height:      64 inches (162.56 cm) Weight:      155.4 pounds (70.64 kg) O2 Sat:      94 % on Room air Temp:     97.3 degrees F (36.28 degrees C) oral Pulse rate:   74 / minute BP sitting:   122 / 50  (left arm) Cuff size:   regular  Vitals Entered By: Orlan Leavens (March 06, 2010 2:18 PM)  O2 Flow:  Room air CC: Hoveround evaluation Is Patient Diabetic? Yes Did you bring your meter with you today? No Pain Assessment Patient in pain? no        Primary Care Provider:  Newt Lukes, MD  CC:  Hoveround evaluation.  History of Present Illness: here for application of power wheelchair  Current Medications (verified): 1)  Clonidine Hcl 0.2 Mg Tabs (Clonidine Hcl) .Marland Kitchen.. 1 By Mouth Four Times A Day 2)  Hydralazine Hcl 10 Mg Tabs (Hydralazine Hcl) .Marland Kitchen.. 1 By Mouth Two Times A Day 3)  Diovan 320 Mg Tabs (Valsartan) .Marland Kitchen.. 1 By Mouth At Bedtime 4)  Amlodipine Besylate 10 Mg Tabs (Amlodipine Besylate) .Marland Kitchen.. 1 By Mouth At Bedtime 5)  Pravachol 10 Mg Tabs (Pravastatin Sodium) .Marland Kitchen.. 1 By Mouth At Bedtime 6)  Humalog 100 Unit/ml Soln (Insulin Lispro (Human)) .... Sliding Scale 7)  Omeprazole 40 Mg Cpdr (Omeprazole) .Marland Kitchen.. 1 By Mouth  Every Morning 8)  Oscal 500/200 D-3 500-200 Mg-Unit Tabs (Calcium-Vitamin D) .Marland Kitchen.. 1 By Mouth Two Times A Day 9)  Aspir-Low 81 Mg Tbec (Aspirin) .... Take 2 At Bedtime 10)  Fish Oil 1200 Mg Caps (Omega-3 Fatty Acids) .... 2 Daily 11)  Cinnamon 500 Mg Tabs (Cinnamon) .... 4 Tabs Daily 12)  Vitamin D3 2000 Unit Caps (Cholecalciferol) .Marland Kitchen.. 1 Daily 13)  Coq-10 300 Mg Caps (Coenzyme Q10) .Marland Kitchen.. 1 Daily 14)  Vitamin C 500mg  .... 1 Once Daily 15)  Potassium Gluconate 595 Mg Cr-Tabs (Potassium Gluconate) .Marland Kitchen.. 1 Once Daily 16)  Gabapentin 300 Mg Caps (Gabapentin) .... Take One Capsule By Mouth Every Night As Needed 17)  Furosemide 40 Mg Tabs (Furosemide) .... 2 Tabs  Every Morning and 1  By Mouth At 2pm 18)  One A Day Womens Multivitamin .Marland Kitchen.. 1 By Mouth Once Daily 19)  Meloxicam 15 Mg Tabs (Meloxicam) .Marland Kitchen.. 1 By Mouth Once Daily As Needed For Arthritis Pain 20)  Robaxin 500 Mg Tabs (Methocarbamol) .Marland Kitchen.. 1 By Mouth Three Times A Day For Muscle Spasm Pain  Allergies (verified): 1)  ! Tekturna (Aliskiren Fumarate) 2)  ! Diltiazem Hcl Er Beads 3)  ! * Sular 4)  ! * Minoridil 5)  ! * Fenofilrate 6)  ! * Capton 7)  ! * Hctz 8)  ! * Carevedilol 9)  ! * Valturna 10)  ! * Tramadol Hcl 50 Mg  Past History:  Past medical, surgical, family and social histories (including risk factors) reviewed, and no changes noted (except as noted below).  Past Medical History: Diabetes mellitus, type 2 hypertension dyslipidemia chronic lymphedema, LLE osteoarthritis - lumbar ddd, cervical, hip R>L PVD   MD roster:  endo -ellison cards -allen ([facility]) ortho - yates  Past Surgical History: R Total hip replacement - 1991, redo 1994 lumbar surg - 1999  Family History: Reviewed history from 11/04/2009 and no changes required. dm: father and 5/8 sibs  Social History: Reviewed history from 11/04/2009 and  no changes required. retired widowed 1991 here with son today, who lives with patient.   quit smoking 1987, prev [id number]  Review of Systems  The patient denies chest pain, syncope, dyspnea on exertion, peripheral edema, and headaches.    Physical Exam  General:  alert, well-developed, well-nourished, and cooperative to examination.   son at side Lungs:  normal respiratory effort, no intercostal retractions or use of accessory muscles; normal breath sounds bilaterally - no crackles and no wheezes.    Heart:  normal rate, regular rhythm, no murmur, and no rub. BLE without edema. Msk:  kyphosis noted - neck with paraspinal tightness/spasm left>right - FROM - normal balance and gait - full strength and sensation BUE -  back: full range of motion of lumbar spine.  Nontender to palpation. Negative straight leg raise. Deep tendon reflexes symmetrically intact. Sensation intact throughout all dermatomes in bilateral lower extremities. Full strength to manual muscle testing in all major muscule groups. Able to heel and toe walk without difficulty and ambulates with a normal gait.  Neurologic:  alert & oriented X3 and cranial nerves II-XII symetrically intact.  strength normal in all extremities, sensation intact to light touch, and gait normal. speech fluent without dysarthria or aphasia; follows commands with good comprehension.  Psych:  Oriented X3, memory intact for recent and remote, normally interactive, good eye contact, not anxious appearing, not depressed appearing, and not agitated.      Impression & Recommendations:  Problem # 1:  OSTEOARTHRITIS (ICD-715.90) Examination has taken place today for a power wheelchair. Because of medical issues such as pain from OA, DDD, PAD , pt is unable to participate in independant tolieting, bathing and dressing. Mobility limitation can not be overcome with cane/walker due to leg weakness/pain (>4/5) and lacks upper body strength for manual WC. Scooter is not an option due to large turning radius. Power WC will allow pt to complete her ADLs. Pt is cognitively/physically capable of operating power WC and willing to do so. I recommend use of power WC.  Her updated medication list for this problem includes:    Aspir-low 81 Mg Tbec (Aspirin) .Marland Kitchen... Take 2 at bedtime    Meloxicam 15 Mg Tabs (Meloxicam) .Marland Kitchen... 1 by mouth once daily as needed for arthritis pain  includes chronic LBP, neck and hip -  long discussion re: risk and benefit of chronic narcotics for same - pt agrees to use sparingly for bad days   Complete Medication List: 1)  Clonidine Hcl 0.2 Mg Tabs (Clonidine hcl) .Marland Kitchen.. 1 by mouth four times a day 2)  Hydralazine Hcl 10 Mg Tabs (Hydralazine hcl) .Marland Kitchen.. 1 by mouth two times a day 3)  Diovan 320 Mg Tabs (Valsartan)  .Marland Kitchen.. 1 by mouth at bedtime 4)  Amlodipine Besylate 10 Mg Tabs (Amlodipine besylate) .Marland Kitchen.. 1 by mouth at bedtime 5)  Pravachol 10 Mg Tabs (Pravastatin sodium) .Marland Kitchen.. 1 by mouth at bedtime 6)  Humalog 100 Unit/ml Soln (Insulin lispro (human)) .... Sliding scale 7)  Omeprazole 40 Mg Cpdr (Omeprazole) .Marland Kitchen.. 1 by mouth  every morning 8)  Oscal 500/200 D-3 500-200 Mg-unit Tabs (Calcium-vitamin d) .Marland Kitchen.. 1 by mouth two times a day 9)  Aspir-low 81 Mg Tbec (Aspirin) .... Take 2 at bedtime 10)  Fish Oil 1200 Mg Caps (Omega-3 fatty acids) .... 2 daily 11)  Cinnamon 500 Mg Tabs (Cinnamon) .... 4 tabs daily 12)  Vitamin D3 2000 Unit Caps (Cholecalciferol) .Marland Kitchen.. 1 daily 13)  Coq-10 300 Mg Caps (coenzyme Q10)  .Marland KitchenMarland KitchenMarland Kitchen  1 daily 14)  Vitamin C 500mg   .... 1 once daily 15)  Potassium Gluconate 595 Mg Cr-tabs (Potassium gluconate) .Marland Kitchen.. 1 once daily 16)  Gabapentin 300 Mg Caps (Gabapentin) .... Take one capsule by mouth every night as needed 17)  Furosemide 40 Mg Tabs (Furosemide) .... 2 tabs  every morning and 1 by mouth at 2pm 18)  One A Day Womens Multivitamin  .Marland Kitchen.. 1 by mouth once daily 19)  Meloxicam 15 Mg Tabs (Meloxicam) .Marland Kitchen.. 1 by mouth once daily as needed for arthritis pain 20)  Robaxin 500 Mg Tabs (Methocarbamol) .Marland Kitchen.. 1 by mouth three times a day for muscle spasm pain  Patient Instructions: 1)  it was good to see you today. 2)  will complete the forms as requested for the power wheelchair - you should be hearing from the supply company soon 3)  Please keep follow-up appointment as previously scheduled, sooner if problems.  Prescriptions: FUROSEMIDE 40 MG TABS (FUROSEMIDE) 2 tabs  every morning and 1 by mouth at 2pm  #90 x 3   Entered and Authorized by:   Newt Lukes MD   Signed by:   Newt Lukes MD on 03/06/2010   Method used:   Electronically to        [facility] (retail)       [address]       [place], [place]  [address]       Ph: [phone]       Fax:  [phone]   RxID:   [id number]

## 2010-09-21 NOTE — Progress Notes (Signed)
Summary: furosemide  Phone Note Refill Request Message from:  Fax from Pharmacy on July 31, 2010 10:45 AM  Refills Requested: Medication #1:  FUROSEMIDE 40 MG TABS 2 tabs  every morning and 1 by mouth at 2pm   Last Refilled: 06/27/2010  Method Requested: Electronic Initial call taken by: Orlan Leavens RMA,  July 31, 2010 10:45 AM    Prescriptions: FUROSEMIDE 40 MG TABS (FUROSEMIDE) 2 tabs  every morning and 1 by mouth at 2pm  #90 x 2   Entered by:   Orlan Leavens RMA   Authorized by:   Newt Lukes MD   Signed by:   Orlan Leavens RMA on 07/31/2010   Method used:   Electronically to        [facility] (retail)       [address]       [place], [place]  [address]       Ph: [phone]       Fax: [phone]   RxID:   [id number]

## 2010-09-21 NOTE — Assessment & Plan Note (Signed)
Summary: 3 MTH FU--STC   Vital Signs:  Patient profile:   77 year old female Height:      65 inches (165.10 cm) Weight:      156.8 pounds (71.27 kg) O2 Sat:      93 % on Room air Temp:     97.5 degrees F (36.39 degrees C) oral Pulse rate:   72 / minute BP sitting:   124 / 52  (left arm) Cuff size:   large  Vitals Entered By: Orlan Leavens RMA (August 07, 2010 1:38 PM)  O2 Flow:  Room air CC: 3 month follow-up Is Patient Diabetic? Yes Did you bring your meter with you today? Yes Pain Assessment Patient in pain? no        Primary Provider:  Newt Lukes, MD  CC:  3 month follow-up.  History of Present Illness: pt states she feels well in general.  she brings a record of her cbg's which i have reviewed today.it is variable, but the vast majority are in the low-100's.  she says she has had only 1 episode of hypoglycemia since last ov here, and that was mild.    Current Medications (verified): 1)  Clonidine Hcl 0.2 Mg Tabs (Clonidine Hcl) .Marland Kitchen.. 1 By Mouth Four Times A Day 2)  Hydralazine Hcl 25 Mg Tabs (Hydralazine Hcl) .... Take One Tablet By Mouth Two Times A Day 3)  Amlodipine Besylate 10 Mg Tabs (Amlodipine Besylate) .Marland Kitchen.. 1 By Mouth At Bedtime 4)  Pravachol 10 Mg Tabs (Pravastatin Sodium) .Marland Kitchen.. 1 By Mouth At Bedtime 5)  Humalog 100 Unit/ml Soln (Insulin Lispro (Human)) .... Sliding Scale 6)  Omeprazole 40 Mg Cpdr (Omeprazole) .Marland Kitchen.. 1 By Mouth  Every Morning 7)  Oscal 500/200 D-3 500-200 Mg-Unit Tabs (Calcium-Vitamin D) .Marland Kitchen.. 1 By Mouth Two Times A Day 8)  Aspir-Low 81 Mg Tbec (Aspirin) .... Take 2 At Bedtime 9)  Fish Oil 1200 Mg Caps (Omega-3 Fatty Acids) .... 2 Daily 10)  Cinnamon 500 Mg Tabs (Cinnamon) .... 4 Tabs Daily 11)  Vitamin D3 2000 Unit Caps (Cholecalciferol) .Marland Kitchen.. 1 Daily 12)  Coq-10 300 Mg Caps (Coenzyme Q10) .Marland Kitchen.. 1 Daily 13)  Vitamin C 500mg  .... 1 Once Daily 14)  Gabapentin 300 Mg Caps (Gabapentin) .... Take One Capsule By Mouth Every Night As Needed 15)   Furosemide 40 Mg Tabs (Furosemide) .... 2 Tabs  Every Morning and 1 By Mouth At 2pm 16)  One A Day Womens Multivitamin .Marland Kitchen.. 1 By Mouth Once Daily 17)  Tylenol 325 Mg Tabs (Acetaminophen) .... As Needed 18)  Aleve 220 Mg Tabs (Naproxen Sodium) .... As Needed 19)  Losartan Potassium 100 Mg Tabs (Losartan Potassium) .Marland Kitchen.. 1 Tab Once Daily 20)  Methocarbamol 500 Mg Tabs (Methocarbamol) .Marland Kitchen.. 1 Tab Three Times A Day As Needed For Cramps 21)  Nystatin 100000 Unit/gm Powd (Nystatin) .... Apply To Affected Area 3-4 Times As Needed 22)  Tramadol Hcl 50 Mg Tabs (Tramadol Hcl) .Marland Kitchen.. 1po Q 6 Hrs As Needed Pain  Allergies (verified): 1)  ! Tekturna (Aliskiren Fumarate) 2)  ! Diltiazem Hcl Er Beads 3)  ! * Sular 4)  ! * Minoridil 5)  ! * Fenofilrate 6)  ! * Capton 7)  ! * Hctz 8)  ! * Carevedilol 9)  ! * Valturna 10)  ! * Tramadol Hcl 50 Mg  Past History:  Past Medical History: Last updated: 03/06/2010 Diabetes mellitus, type 2 hypertension dyslipidemia chronic lymphedema, LLE osteoarthritis - lumbar ddd, cervical, hip  R>L PVD   MD roster:  endo -ellison cards -allen ([facility]) ortho - yates  Review of Systems  The patient denies syncope.    Physical Exam  General:  normal appearance.   Skin:  infusion sites at the anterior abdomen are without lesions.   Additional Exam:  Hemoglobin A1C       [H]  6.9 %     Impression & Recommendations:  Problem # 1:  IDDM (ICD-250.01) well-controlled  Other Orders: TLB-A1C / Hgb A1C (Glycohemoglobin) (83036-A1C) Est. Patient Level III (91478)  Patient Instructions: 1)  tests are being ordered for you today.  a few days after the test(s), please call [phone] to hear your test results. 2)  pending the test results, please continue basal rate of 1 unit/hr, 24 hrs per day.   3)  continue bolus of 15 units with each meal. 4)  continue correction bolus (which some people call "sensitivity," or "insulin sensitivity ratio," or just "isr")  of 1 unit for each by 25 which your glucose exceeds 120. 5)  Please schedule a follow-up appointment in 3 months.  6)  (update: i left message on phone-tree:  rx as we discussed)   Orders Added: 1)  TLB-A1C / Hgb A1C (Glycohemoglobin) [83036-A1C] 2)  Est. Patient Level III [08657]

## 2010-09-21 NOTE — Assessment & Plan Note (Signed)
Summary: 6 MO ROV /NWS  #   Vital Signs:  Patient profile:   77 year old female Height:      65 inches (165.10 cm) Weight:      156.0 pounds (70.91 kg) O2 Sat:      93 % on Room air Temp:     97.5 degrees F (36.39 degrees C) oral Pulse rate:   72 / minute BP sitting:   124 / 52  (left arm) Cuff size:   regular  Vitals Entered By: Orlan Leavens RMA (August 07, 2010 2:21 PM)  O2 Flow:  Room air CC: 6 MONTH FOLLOW-UP Is Patient Diabetic? Yes Did you bring your meter with you today? Yes Comments Req to have urine check ? UTI   Primary Care Provider:  Newt Lukes, MD  CC:  6 MONTH FOLLOW-UP.  History of Present Illness: here for f/u-  1) DM2 - reports compliance with ongoing medical treatment and no changes in medication dose or frequency. denies adverse side effects related to current therapy. denies hypoglycemia symptoms - follows with endo for same  2) dyslipidemia - reports compliance with ongoing medical treatment and no changes in medication dose or frequency. denies adverse side effects related to current therapy. no GI or muscle SE or c/o  3) HTN - reports compliance with ongoing medical treatment and no changes in medication dose or frequency. denies adverse side effects related to current therapy. no HA or CP -  4) chronic LBP - despite surg and prior steroid injections, daily pain -  takes hydrocodone 5/325 qam and tylenol at bedtime for same - no weakness in legs or recent falls - inc right hip pain - seeing ortho for same (olin)  5) PVD - recent ABI 05/2010 with blockage - but pt concerned "nothing" is being done for it, esp considering right hip revision due to hip/back pain symptoms -   Clinical Review Panels:  Lipid Management   Cholesterol:  156 (02/03/2010)   LDL (bad choesterol):  71 (02/03/2010)   HDL (good cholesterol):  47.00 (02/03/2010)  CBC   WBC:  6.1 (10/05/2009)   Hgb:  11.5 (10/05/2009)   Platelets:  152 (10/05/2009)  Complete  Metabolic Panel   Glucose:  137 (05/09/2010)   Sodium:  142 (05/09/2010)   Potassium:  4.5 (05/09/2010)   Chloride:  100 (05/09/2010)   CO2:  32 (05/09/2010)   BUN:  35 (05/09/2010)   Creatinine:  1.2 (05/09/2010)   Albumin:  4.0 (02/03/2010)   Total Protein:  6.8 (02/03/2010)   Calcium:  10.9 (05/09/2010)   Total Bili:  0.3 (02/03/2010)   Alk Phos:  80 (02/03/2010)   SGPT (ALT):  26 (02/03/2010)   SGOT (AST):  31 (02/03/2010)   Current Medications (verified): 1)  Clonidine Hcl 0.2 Mg Tabs (Clonidine Hcl) .Marland Kitchen.. 1 By Mouth Four Times A Day 2)  Hydralazine Hcl 25 Mg Tabs (Hydralazine Hcl) .... Take One Tablet By Mouth Two Times A Day 3)  Amlodipine Besylate 10 Mg Tabs (Amlodipine Besylate) .Marland Kitchen.. 1 By Mouth At Bedtime 4)  Pravachol 10 Mg Tabs (Pravastatin Sodium) .Marland Kitchen.. 1 By Mouth At Bedtime 5)  Humalog 100 Unit/ml Soln (Insulin Lispro (Human)) .... Sliding Scale 6)  Omeprazole 40 Mg Cpdr (Omeprazole) .Marland Kitchen.. 1 By Mouth  Every Morning 7)  Oscal 500/200 D-3 500-200 Mg-Unit Tabs (Calcium-Vitamin D) .Marland Kitchen.. 1 By Mouth Two Times A Day 8)  Aspir-Low 81 Mg Tbec (Aspirin) .... Take 2 At Bedtime 9)  Fish Oil  1200 Mg Caps (Omega-3 Fatty Acids) .... 2 Daily 10)  Cinnamon 500 Mg Tabs (Cinnamon) .... 4 Tabs Daily 11)  Vitamin D3 2000 Unit Caps (Cholecalciferol) .Marland Kitchen.. 1 Daily 12)  Coq-10 300 Mg Caps (Coenzyme Q10) .Marland Kitchen.. 1 Daily 13)  Vitamin C 500mg  .... 1 Once Daily 14)  Gabapentin 300 Mg Caps (Gabapentin) .... Take One Capsule By Mouth Every Night As Needed 15)  Furosemide 40 Mg Tabs (Furosemide) .... 2 Tabs  Every Morning and 1 By Mouth At 2pm 16)  One A Day Womens Multivitamin .Marland Kitchen.. 1 By Mouth Once Daily 17)  Tylenol 325 Mg Tabs (Acetaminophen) .... As Needed 18)  Aleve 220 Mg Tabs (Naproxen Sodium) .... As Needed 19)  Losartan Potassium 100 Mg Tabs (Losartan Potassium) .Marland Kitchen.. 1 Tab Once Daily 20)  Methocarbamol 500 Mg Tabs (Methocarbamol) .Marland Kitchen.. 1 Tab Three Times A Day As Needed For Cramps 21)  Nystatin  100000 Unit/gm Powd (Nystatin) .... Apply To Affected Area 3-4 Times As Needed 22)  Tramadol Hcl 50 Mg Tabs (Tramadol Hcl) .Marland Kitchen.. 1po Q 6 Hrs As Needed Pain  Allergies (verified): 1)  ! Tekturna (Aliskiren Fumarate) 2)  ! Diltiazem Hcl Er Beads 3)  ! * Sular 4)  ! * Minoridil 5)  ! * Fenofilrate 6)  ! * Capton 7)  ! * Hctz 8)  ! * Carevedilol 9)  ! * Valturna 10)  ! * Tramadol Hcl 50 Mg  Past History:  Past Medical History: Diabetes mellitus, type 2 hypertension dyslipidemia chronic lymphedema, LLE/venous insuff osteoarthritis - lumbar ddd, cervical, hip R>L PVD  - B iliac stents 2002 - ABI 05/2010 - R 0.55, L .75   also right SFA stenosis - med mgmt  MD roster:   endo -ellison cards -adria ([place] [facility]) ortho - olin/gioffre  Review of Systems       The patient complains of difficulty walking.  The patient denies chest pain, dyspnea on exertion, abdominal pain, muscle weakness, and suspicious skin lesions.    Physical Exam  General:  alert and well-developed.   Lungs:  normal respiratory effort and no wheezes.   Heart:  normal rate and regular rhythm.   Psych:  Oriented X3, memory intact for recent and remote, normally interactive, good eye contact, not anxious appearing, not depressed appearing, and not agitated.      Impression & Recommendations:  Problem # 1:  HIP PAIN, RIGHT (ICD-719.45) chronci - prior revision and THR x 2 - now ongoing re-eval by [place] ortho - olin - ?revision if ok with VVS (consult 07/25/10 reviewed - ok to proceed if needed) reviewed same with pt at length today Her updated medication list for this problem includes:    Aspir-low 81 Mg Tbec (Aspirin) .Marland Kitchen... Take 2 at bedtime    Tylenol 325 Mg Tabs (Acetaminophen) .Marland Kitchen... As needed    Aleve 220 Mg Tabs (Naproxen sodium) .Marland Kitchen... As needed    Methocarbamol 500 Mg Tabs (Methocarbamol) .Marland Kitchen... 1 tab three times a day as needed for cramps    Tramadol Hcl 50 Mg Tabs (Tramadol hcl) .Marland Kitchen... 1po q 6 hrs as  needed pain  Problem # 2:  PERIPHERAL VASCULAR DISEASE (ICD-443.9) s/p second opinion with vascular surg- pt does not understand why "nothing being done" - reviewed current med tx is approp treat as ongoing med and f/u ortho for hip pain, f/u any worsening signs or symptoms , Continue all previous medications as before this visit   Problem # 3:  CYSTITIS (ICD-595.9)  Her  updated medication list for this problem includes:    Macrodantin 100 Mg Caps (Nitrofurantoin macrocrystal) .Marland Kitchen... 1 by mouth two times a day x 7 days  Orders: UA Dipstick w/o Micro (manual) ([address]) Prescription Created Electronically [phone] [phone])  Encouraged to push clear liquids, get enough rest, and take acetaminophen as needed. To be seen in 10 days if no improvement, sooner if worse.  Complete Medication List: 1)  Clonidine Hcl 0.2 Mg Tabs (Clonidine hcl) .Marland Kitchen.. 1 by mouth four times a day 2)  Hydralazine Hcl 25 Mg Tabs (Hydralazine hcl) .... Take one tablet by mouth two times a day 3)  Amlodipine Besylate 10 Mg Tabs (Amlodipine besylate) .Marland Kitchen.. 1 by mouth at bedtime 4)  Pravachol 10 Mg Tabs (Pravastatin sodium) .Marland Kitchen.. 1 by mouth at bedtime 5)  Humalog 100 Unit/ml Soln (Insulin lispro (human)) .... Sliding scale 6)  Omeprazole 40 Mg Cpdr (Omeprazole) .Marland Kitchen.. 1 by mouth  every morning 7)  Oscal 500/200 D-3 500-200 Mg-unit Tabs (Calcium-vitamin d) .Marland Kitchen.. 1 by mouth two times a day 8)  Aspir-low 81 Mg Tbec (Aspirin) .... Take 2 at bedtime 9)  Fish Oil 1200 Mg Caps (Omega-3 fatty acids) .... 2 daily 10)  Cinnamon 500 Mg Tabs (Cinnamon) .... 4 tabs daily 11)  Vitamin D3 2000 Unit Caps (Cholecalciferol) .Marland Kitchen.. 1 daily 12)  Coq-10 300 Mg Caps (coenzyme Q10)  .Marland Kitchen.. 1 daily 13)  Vitamin C 500mg   .... 1 once daily 14)  Gabapentin 300 Mg Caps (Gabapentin) .... Take one capsule by mouth every night as needed 15)  Furosemide 40 Mg Tabs (Furosemide) .... 2 tabs  every morning and 1 by mouth at 2pm 16)  One A Day Womens Multivitamin  .Marland Kitchen.. 1 by  mouth once daily 17)  Tylenol 325 Mg Tabs (Acetaminophen) .... As needed 18)  Aleve 220 Mg Tabs (Naproxen sodium) .... As needed 19)  Losartan Potassium 100 Mg Tabs (Losartan potassium) .Marland Kitchen.. 1 tab once daily 20)  Methocarbamol 500 Mg Tabs (Methocarbamol) .Marland Kitchen.. 1 tab three times a day as needed for cramps 21)  Nystatin 100000 Unit/gm Powd (Nystatin) .... Apply to affected area 3-4 times as needed 22)  Tramadol Hcl 50 Mg Tabs (Tramadol hcl) .Marland Kitchen.. 1po q 6 hrs as needed pain 23)  Macrodantin 100 Mg Caps (Nitrofurantoin macrocrystal) .Marland Kitchen.. 1 by mouth two times a day x 7 days  Patient Instructions: 1)  it was good to see you today. 2)  blood flow tests 05/2010 and vascular eva;luation by dr. early 07/2010 reviewed - no plans for change in medications or intervention for blockage - continue current medications and plan surgery if needed 3)  antibioitcs for your bladder infetion symptoms - your prescription has been electronically submitted to your pharmacy. Please take as directed. Contact our office if you believe you're having problems with the medication(s).  4)  Please schedule a follow-up appointment in 6 months, call sooner if problems.  Prescriptions: MACRODANTIN 100 MG CAPS (NITROFURANTOIN MACROCRYSTAL) 1 by mouth two times a day x 7 days  #14 x 0   Entered and Authorized by:   Newt Lukes MD   Signed by:   Newt Lukes MD on 08/07/2010   Method used:   Electronically to        [facility] (retail)       [address]       [place], [place]  [address]       Ph: [phone]  Fax: [phone]   RxID:   [id number]    Orders Added: 1)  UA Dipstick w/o Micro (manual) [81002] 2)  Est. Patient Level IV [30865] 3)  Prescription Created Electronically [phone]     Laboratory Results   Urine Tests    Routine Urinalysis   Color: yellow Appearance: Clear Glucose: negative   (Normal Range: Negative) Bilirubin: negative   (Normal Range:  Negative) Ketone: negative   (Normal Range: Negative) Spec. Gravity: <1.005   (Normal Range: 1.003-1.035) Blood: small   (Normal Range: Negative) pH: 5.0   (Normal Range: 5.0-8.0) Protein: negative   (Normal Range: Negative) Urobilinogen: 0.2   (Normal Range: 0-1) Nitrite: positive   (Normal Range: Negative) Leukocyte Esterace: small   (Normal Range: Negative)

## 2010-09-21 NOTE — Progress Notes (Signed)
Summary: Nuc. Pre-Procedure  Phone Note Outgoing Call Call back at [facility] Phone [phone]   Call placed by: Irean Hong, RN,  September 07, 2010 1:14 PM Summary of Call: Reviewed information on [employer] Information Sheet (see scanned document for further details).  Spoke with patient per Tomasa Blase.     Nuclear Med Background Indications for Stress Test: Evaluation for Ischemia, Surgical Clearance  Indications Comments: Pending (R) THR by Dr. Durene Romans  History: Abnormal EKG  History Comments: mild AS.  Symptoms: Chest Pain, DOE  Symptoms Comments: Left sided chest pain radiates to (L) shoulder.   Nuclear Pre-Procedure Cardiac Risk Factors: Claudication, Hypertension, IDDM Type 2, Lipids, PVD Height (in): 65

## 2010-09-21 NOTE — Letter (Signed)
Summary: [facility]   Imported By: Sherian Rein 08/31/2010 09:46:19  _____________________________________________________________________  External Attachment:    Type:   Image     Comment:   External Document

## 2010-09-21 NOTE — Progress Notes (Signed)
Summary: clonidine  Phone Note Refill Request Message from:  Fax from Pharmacy on August 30, 2010 1:00 PM  Refills Requested: Medication #1:  CLONIDINE HCL 0.2 MG TABS 1 by mouth four times a day # 360  Method Requested: Electronic Initial call taken by: Orlan Leavens RMA,  August 30, 2010 1:00 PM    Prescriptions: CLONIDINE HCL 0.2 MG TABS (CLONIDINE HCL) 1 by mouth four times a day  #360 x 1   Entered by:   Orlan Leavens RMA   Authorized by:   Newt Lukes MD   Signed by:   Orlan Leavens RMA on 08/30/2010   Method used:   Electronically to        [facility] (retail)       [address]       [place], [place]  [address]       Ph: [phone]       Fax: [phone]   RxID:   [id number]

## 2010-09-21 NOTE — Letter (Signed)
Summary: [facility]  [facility]   Imported By: Sherian Rein 08/04/2010 10:06:29  _____________________________________________________________________  External Attachment:    Type:   Image     Comment:   External Document

## 2010-09-21 NOTE — Progress Notes (Signed)
Summary: abnormal stress test  Phone Note Outgoing Call   Call placed by: Dessie Coma  LPN,  September 15, 2010 9:57 AM Call placed to: Patient Summary of Call: Patient notified per Dr. Kirke Corin, stress test was abnormal and he needs to see her ASAP.  Appt. scheduled for today at 12noon.

## 2010-09-21 NOTE — Progress Notes (Signed)
----   Converted from flag ---- ---- 09/04/2010 3:48 PM, Marilynne Halsted, CMA, AAMA wrote: APPROVAL FOR NUC TEST 09-11-10  ------------------------------

## 2010-09-21 NOTE — Assessment & Plan Note (Addendum)
Summary: Cardiology Nuclear Testing  Nuclear Med Background Indications for Stress Test: Evaluation for Ischemia, Surgical Clearance  Indications Comments: Pending (R) THR by Dr. Durene Romans  History: Abnormal EKG  History Comments: mild AS.  Symptoms: Chest Pain, DOE, Palpitations  Symptoms Comments: Left sided chest pain radiates to (L) shoulder.   Nuclear Pre-Procedure Cardiac Risk Factors: Claudication, Hypertension, IDDM Type 2, Lipids, PVD Caffeine/Decaff Intake: none NPO After: 7:30 AM Lungs: clear IV 0.9% NS with Angio Cath: 22g     IV Site: R Hand IV Started by: Cathlyn Parsons, RN Chest Size (in) 38     Cup Size C     Height (in): 64 Weight (lb): 156 BMI: 26.87 Tech Comments: Patient on insulin pump. BS at home prior to breakfast 147.  BS 140 at 1100.  Nuclear Med Study 1 or 2 day study:  1 day     Stress Test Type:  Eugenie Birks Reading MD:  Dietrich Pates, MD     Referring MD:  Judie Petit.Arida    Stress Protocol  Max Systolic BP: 165 mm Hg Lexiscan: 0.4 mg   Stress Test Technologist:  Milana Na, EMT-P Stress Procedure  The patient received IV Lexiscan 0.4 mg over 15-seconds.  Technetium 40m Tetrofosmin injected at 30-seconds.  There were + significant changes and rare pac with infusion.  Quantitative spect images were obtained after a 45 minute delay.  QPS Raw Data Images:  Images were motion corrected.  Soft tissue(breast, diaphragm) surround heart. Stress Images:  Thinning with mild decrased counts in the anteiror wall (minimally mid and distal).  Best seen in the short axis images.  Otherwise normal perfusion. Rest Images:  Normalization of the anterior wall, again best seen in the short axis. Subtraction (SDS):  Borderline.    Overall Impression  Exercise Capacity: Lexiscan with no exercise. BP Response: Normal blood pressure response. Clinical Symptoms: No chest pain ECG Impression: 1 mm ST depression in II, AVF that does not normalize in recovery.   Electrically positivie Overall Impression Comments: Very mild ischemia in the mid (minimal)and distal anterior wall  Best imaged in the short axis views only.  Cannot exclude some shifting soft tissue (breast).

## 2010-09-27 ENCOUNTER — Telehealth: Payer: Self-pay | Admitting: Cardiovascular Disease

## 2010-10-02 NOTE — Assessment & Plan Note (Signed)
[facility]                       [place] [facility] OFFICE NOTE  NAME:Emma Yu, Emma Yu                   MRN:          [id number] DATE:09/15/2010                            DOB:          19-Jul-1927   HISTORY OF PRESENT ILLNESS:  Ms. Ciccarelli is an 77 year old female who is here today for a followup visit after her recent stress test.  She has the following problem list: 1. Peripheral arterial disease, status post bilateral common iliac     stenting in 2002 by Dr. Beverely Pace.  Most recent ABI evaluation was done     in October 2011 which was moderately reduced on the right side at     0.55 and mildly reduced on the left side at 0.75.  [name] known to     have significant proximal right superficial femoral artery stenosis     which is being treated medically. 2. Mild aortic stenosis. 3. Chronic lower extremity edema due to venous insufficiency. 4. Type 2 diabetes. 5. Hypertension. 6. Hyperlipidemia.  INTERVAL HISTORY:  The patient is here today for a followup visit after her recent stress test.  During her last visit, she was complaining of left-sided chest pain radiating to her left shoulder.  However, since the last visit she actually has not had any further episodes.  She does have chronic exertional dyspnea.  Her functional capacity is reduced due to her joint problems.  She did undergo a Lexiscan nuclear stress test which showed very mild ischemia in the mid and distal anterior wall versus breast attenuation.  PHYSICAL EXAMINATION:  VITAL SIGNS:  Weight is 154.8 pounds, blood pressure is 120/55, pulse is 80, oxygen saturation is 95% on room air. NECK:  No JVD.  There is radiated murmur to the carotid arteries from the aortic area. RESPIRATORY:  Normal respiratory effort with no use of accessory muscles.  Auscultation reveals normal breath sounds. CARDIOVASCULAR:  Normal PMI.  Normal S1 and S2 with no gallops.  There is a 2/6 systolic ejection  murmur at the aortic area radiating to the carotid arteries. ABDOMEN:  Benign, nontender, and nondistended. EXTREMITIES:  With trace edema bilaterally.  IMPRESSION: 1. Preoperative cardiovascular evaluation for hip surgery:  Overall,     her stress test was slightly abnormal with possible minimal distal     anterior wall ischemia versus shifting breast attenuation with     normal ejection fraction.  I do not really see high-risk features     on her stress test.  Thus, there is no contraindication for the     planned surgery.  Also given that her chest pain has resolved, I     really do not favor the route of cardiac catheterization.  The     patient can proceed with a planned surgery at moderate     cardiovascular risk.  I will go ahead and start her on metoprolol     25 mg twice daily and stop hydralazine. 2. Peripheral arterial disease.  We will continue with the medical     therapy with Pletal. 3. Hypertension.  Blood pressure is well-controlled.  As mentioned     above,  we will stop     hydralazine and start metoprolol 25 mg twice daily.  The patient's     aspirin can be stopped 1 week before the surgery if needed.     Aggressive prophylaxis for DVT is recommended.    Lorine Bears, MD Electronically Signed   MA/MedQ  DD: 09/15/2010  DT: 09/16/2010  Job #: [id number]

## 2010-10-05 NOTE — Progress Notes (Signed)
Summary: Has Question about Metoprolol and blood sugars  Phone Note Call from Patient   Summary of Call: pt states she thinks that taking Metoprolol has made her blood sugar go up.  She says she feels better taking the med. but can't control her blood sugar.  Could this be the cause?  Went up since she started new medication. Initial call taken by: Park Breed,  September 27, 2010 2:55 PM  Follow-up for Phone Call        Phone Call Completed:  Patient notified per Dr. Kirke Corin, Metoprolol does not affect Blood sugars. Follow-up by: Dessie Coma  LPN,  September 28, 2010 3:33 PM

## 2010-10-05 NOTE — Letter (Signed)
Summary: [facility]   Imported By: Sherian Rein 09/27/2010 09:55:29  _____________________________________________________________________  External Attachment:    Type:   Image     Comment:   External Document

## 2010-10-13 ENCOUNTER — Other Ambulatory Visit: Payer: Self-pay | Admitting: Orthopedic Surgery

## 2010-10-13 ENCOUNTER — Ambulatory Visit (HOSPITAL_COMMUNITY)
Admission: RE | Admit: 2010-10-13 | Discharge: 2010-10-13 | Disposition: A | Discharge status: Home / Self Care | Payer: Medicare Other | Source: Ambulatory Visit | Attending: Orthopedic Surgery | Admitting: Orthopedic Surgery

## 2010-10-13 ENCOUNTER — Telehealth: Payer: Self-pay | Admitting: Endocrinology

## 2010-10-13 ENCOUNTER — Encounter (HOSPITAL_COMMUNITY): Payer: Medicare Other

## 2010-10-13 ENCOUNTER — Other Ambulatory Visit (HOSPITAL_COMMUNITY): Payer: Self-pay | Admitting: Orthopedic Surgery

## 2010-10-13 DIAGNOSIS — Z01818 Encounter for other preprocedural examination: Secondary | ICD-10-CM | POA: Insufficient documentation

## 2010-10-13 DIAGNOSIS — R9439 Abnormal result of other cardiovascular function study: Secondary | ICD-10-CM | POA: Insufficient documentation

## 2010-10-13 DIAGNOSIS — R011 Cardiac murmur, unspecified: Secondary | ICD-10-CM | POA: Insufficient documentation

## 2010-10-13 LAB — DIFFERENTIAL
Basophils Relative: 1 % (ref 0–1)
Eosinophils Absolute: 0.2 10*3/uL (ref 0.0–0.7)
Eosinophils Relative: 3 % (ref 0–5)
Lymphs Abs: 2.9 10*3/uL (ref 0.7–4.0)
Monocytes Relative: 10 % (ref 3–12)
Neutrophils Relative %: 40 % — ABNORMAL LOW (ref 43–77)

## 2010-10-13 LAB — BASIC METABOLIC PANEL
BUN: 55 mg/dL — ABNORMAL HIGH (ref 6–23)
CO2: 29 mEq/L (ref 19–32)
Calcium: 10.1 mg/dL (ref 8.4–10.5)
Chloride: 102 mEq/L (ref 96–112)
Creatinine, Ser: 1.72 mg/dL — ABNORMAL HIGH (ref 0.4–1.2)
GFR calc Af Amer: 34 mL/min — ABNORMAL LOW (ref >60)

## 2010-10-13 LAB — CBC
Hemoglobin: 11.1 g/dL — ABNORMAL LOW (ref 12.0–15.0)
MCH: 29 pg (ref 26.0–34.0)
MCHC: 31.4 g/dL (ref 30.0–36.0)
MCV: 92.4 fL (ref 78.0–100.0)
Platelets: 188 10*3/uL (ref 150–400)
RBC: 3.83 MIL/uL — ABNORMAL LOW (ref 3.87–5.11)

## 2010-10-13 LAB — URINALYSIS, ROUTINE W REFLEX MICROSCOPIC
Bilirubin Urine: NEGATIVE
Hgb urine dipstick: NEGATIVE
Ketones, ur: NEGATIVE mg/dL
Specific Gravity, Urine: 1.013 (ref 1.005–1.030)
Urine Glucose, Fasting: NEGATIVE mg/dL
pH: 5 (ref 5.0–8.0)

## 2010-10-13 LAB — PROTIME-INR: INR: 1.04 (ref 0.00–1.49)

## 2010-10-13 LAB — SURGICAL PCR SCREEN
MRSA, PCR: NEGATIVE
Staphylococcus aureus: NEGATIVE

## 2010-10-13 LAB — URINE MICROSCOPIC-ADD ON

## 2010-10-20 ENCOUNTER — Encounter: Payer: Self-pay | Admitting: Endocrinology

## 2010-10-20 ENCOUNTER — Other Ambulatory Visit: Payer: Self-pay | Admitting: Endocrinology

## 2010-10-20 ENCOUNTER — Other Ambulatory Visit: Payer: Medicare Other

## 2010-10-20 ENCOUNTER — Ambulatory Visit (INDEPENDENT_AMBULATORY_CARE_PROVIDER_SITE_OTHER): Payer: Medicare Other | Admitting: Endocrinology

## 2010-10-20 DIAGNOSIS — E109 Type 1 diabetes mellitus without complications: Secondary | ICD-10-CM

## 2010-10-20 LAB — HEMOGLOBIN A1C: Hgb A1c MFr Bld: 6.9 % — ABNORMAL HIGH (ref 4.6–6.5)

## 2010-10-23 ENCOUNTER — Inpatient Hospital Stay (HOSPITAL_COMMUNITY): Payer: Medicare Other

## 2010-10-23 ENCOUNTER — Inpatient Hospital Stay (HOSPITAL_COMMUNITY)
Admission: RE | Admit: 2010-10-23 | Discharge: 2010-10-31 | DRG: 468 | Disposition: A | Discharge status: Skilled Nursing Facility | Payer: Medicare Other | Source: Ambulatory Visit | Attending: Orthopedic Surgery | Admitting: Orthopedic Surgery

## 2010-10-23 DIAGNOSIS — E119 Type 2 diabetes mellitus without complications: Secondary | ICD-10-CM | POA: Diagnosis present

## 2010-10-23 DIAGNOSIS — I10 Essential (primary) hypertension: Secondary | ICD-10-CM | POA: Diagnosis present

## 2010-10-23 DIAGNOSIS — M169 Osteoarthritis of hip, unspecified: Secondary | ICD-10-CM | POA: Diagnosis present

## 2010-10-23 DIAGNOSIS — I739 Peripheral vascular disease, unspecified: Secondary | ICD-10-CM | POA: Diagnosis present

## 2010-10-23 DIAGNOSIS — H9190 Unspecified hearing loss, unspecified ear: Secondary | ICD-10-CM | POA: Diagnosis present

## 2010-10-23 DIAGNOSIS — Z9641 Presence of insulin pump (external) (internal): Secondary | ICD-10-CM

## 2010-10-23 DIAGNOSIS — K219 Gastro-esophageal reflux disease without esophagitis: Secondary | ICD-10-CM | POA: Diagnosis present

## 2010-10-23 DIAGNOSIS — Y831 Surgical operation with implant of artificial internal device as the cause of abnormal reaction of the patient, or of later complication, without mention of misadventure at the time of the procedure: Secondary | ICD-10-CM | POA: Diagnosis present

## 2010-10-23 DIAGNOSIS — Z23 Encounter for immunization: Secondary | ICD-10-CM

## 2010-10-23 DIAGNOSIS — T84099A Other mechanical complication of unspecified internal joint prosthesis, initial encounter: Principal | ICD-10-CM | POA: Diagnosis present

## 2010-10-23 DIAGNOSIS — R32 Unspecified urinary incontinence: Secondary | ICD-10-CM | POA: Diagnosis present

## 2010-10-23 DIAGNOSIS — Z96649 Presence of unspecified artificial hip joint: Secondary | ICD-10-CM

## 2010-10-23 DIAGNOSIS — M1610 Unilateral primary osteoarthritis, unspecified hip: Secondary | ICD-10-CM | POA: Diagnosis present

## 2010-10-23 DIAGNOSIS — I4891 Unspecified atrial fibrillation: Secondary | ICD-10-CM | POA: Diagnosis not present

## 2010-10-23 DIAGNOSIS — Z86718 Personal history of other venous thrombosis and embolism: Secondary | ICD-10-CM

## 2010-10-23 LAB — GLUCOSE, CAPILLARY
Glucose-Capillary: 185 mg/dL — ABNORMAL HIGH (ref 70–99)
Glucose-Capillary: 191 mg/dL — ABNORMAL HIGH (ref 70–99)
Glucose-Capillary: 271 mg/dL — ABNORMAL HIGH (ref 70–99)

## 2010-10-23 LAB — BASIC METABOLIC PANEL
BUN: 41 mg/dL — ABNORMAL HIGH (ref 6–23)
Chloride: 106 mEq/L (ref 96–112)
GFR calc non Af Amer: 43 mL/min — ABNORMAL LOW (ref >60)
Glucose, Bld: 175 mg/dL — ABNORMAL HIGH (ref 70–99)
Potassium: 4.4 mEq/L (ref 3.5–5.1)
Sodium: 144 mEq/L (ref 135–145)

## 2010-10-23 LAB — GRAM STAIN

## 2010-10-23 LAB — TYPE AND SCREEN

## 2010-10-23 NOTE — Op Note (Signed)
NAME:  Emma Yu, Emma Yu NO.:  [id number]  MEDICAL RECORD NO.:  [id number]           PATIENT TYPE:  I  LOCATION:  0008                         FACILITY:  [facility]  PHYSICIAN:  Madlyn Frankel. Charlann Boxer, M.D.  DATE OF BIRTH:  1927-04-18  DATE OF PROCEDURE:  10/23/2010 DATE OF DISCHARGE:                              OPERATIVE REPORT   PREOPERATIVE DIAGNOSIS:  Failed right hip with pain.  POSTOPERATIVE DIAGNOSIS:  Failed right hip with pain.  PROCEDURE:  Revision right total-hip including the acetabular shell liner and femoral head, maintenance of the previous femoral stem.  This was utilizing DePuy components of a size 58 Gription multihole cup, two cancellous screws, a 36 +4 10-degree liner Altrex and a 36 +5 large taper ball.  SURGEON:  Madlyn Frankel. Charlann Boxer, M.D.  ASSISTANT:  Jaquelyn Bitter. Chabon, P.A.  ANESTHESIA:  General.  SPECIMENS:  I did send joint fluid.  Early results came rare white blood cells.  No bacteria.  There appeared to be a synovial reaction related to polyethylene wear.  ESTIMATED BLOOD LOSS:  About 400 cc.  DRAINS:  None.  COMPLICATIONS:  None.  INDICATIONS FOR THE PROCEDURE:  Emma Yu is an 77 year old female who had an index right total-hip replacement performed about 17 years ago.  She presented to our office for evaluation of hip discomfort, groin pain as well as a degenerative lumbar spine.  She had a vertically oriented cup that appeared to be migrating towards her medial wall as well as asymmetric polyethylene wear.  Given her onset of pain and discomfort and her age, we felt at this point that a revision hip surgery would be in her best interest to prevent further complications from acetabular protrusion.  After reviewing the risks of infection, DVT, component failure and dislocation, consent was obtained for revision hip surgery.  The risks and benefits were explained and consent was obtained.  PROCEDURE IN DETAIL:  The patient was  brought to the operative theater. Once adequate anesthesia, preoperative antibiotics, Ancef administered, the patient was positioned in the left lateral decubitus position with the right side up.  The right lower extremity was then prepped and draped in the sterile fashion.  A time-out was performed identifying the patient, planned procedure and extremity.  A portion of the patient's old incision was utilized.  Sharp dissection was carried down to the old iliotibial band, removing old Ethibond sutures.  This was then incised posteriorly.  Soft tissue exposure was carried out, going through pseudo capsular tissue and then removing a significant amount of synovitic tissue layer at the cup junction.  There appeared to be a little bit of reactive joint fluid and I sent it off. It did not have obvious purulence.  Once I had basically the posterior aspect of the cup and hip exposed, we dislocated the hip and removed the femoral head and I elevated some of the capsule and minimus off of the ilium and was able to place the trunnion up onto the ilium.  At this point, I removed the acetabular liner using a drill and 6.5 cannulated screw.  The worn polyethylene was removed.  At this  point, using osteotomes I was able to remove the acetabular shell.  There was some bony ingrowth that was still persistent, mainly on the inferior medial aspect of the cup.  Once the cup was removed, we identified that in fact she was down to the medial wall.  There was significant wear of the anterior portion of the hip as well.  Bone quality otherwise was decent.  She had a 54 cup in.  I lightly reamed to prepare bone, given the remaining bone stock was minimal.  I chose a 58 Gription cup and then impacted it.  The 58 cup was then impacted and it did have some initial good scratch fit.  Please note that I did use probably 15 cc of a total 30 of cancellous chips into an area both medially as well as into the  anterior and into the pubic bone area.  I was able to get one great screw into the ilium, cancellous screw, and then another smaller screw that was more medial-based to provide some rotational support. This was not as good as the ilium screw.  No other screw was viable based on her remaining bone.  A trial reduction was now carried out with a 36 neutral +4 liner and a 36 +5 ball.  The hip stability appeared to be very adequate without evidence of impingement.  I did, however, choose to use a 10-degrees lip liner, based on the fact that this was revision surgery and I did not want to cause complications.  The cup position at this point was significantly different than her initial preoperative hip.  At this point, following final irrigation, the 36 +4 10-degrees liner was impacted.  The final 36 +5 ball large taper was then chosen and impacted onto a clean and dry trunnion.  The hip was reduced.  The hip was irrigated throughout the case and again at this point.  I reapproximated posterior pseudo capsular back to the proximal structures which included the gluteus muscle area as well as the pseudocapsule.  No Hemovac drain was deemed necessary.  I reapproximated the iliotibial band and gluteal fascia with #1 Vicryl.  The remainder of the wound was closed with 2-0 Vicryl and running 4-0 Monocryl.  The hip was cleaned, dried and dressed sterilely using Dermabond and Aquacel dressing.  She was then extubated and brought to the recovery room in stable condition.  Postoperatively I am going to plan for her to be touchdown weightbearing for probably 6 weeks to allow bony ingrowth as well as to prevent any excessive torque through the hip joint.  Range of motion activities will be permissive to a relative extent of tolerance.     Madlyn Frankel Charlann Boxer, M.D.     MDO/MEDQ  D:  10/23/2010  T:  10/23/2010  Job:  [id number]  Electronically Signed by Durene Romans M.D. on 10/23/2010 12:09:40 PM

## 2010-10-24 LAB — BASIC METABOLIC PANEL
CO2: 33 mEq/L — ABNORMAL HIGH (ref 19–32)
Calcium: 8.7 mg/dL (ref 8.4–10.5)
Creatinine, Ser: 1.09 mg/dL (ref 0.4–1.2)
GFR calc Af Amer: 58 mL/min — ABNORMAL LOW (ref >60)
GFR calc non Af Amer: 48 mL/min — ABNORMAL LOW (ref >60)
Glucose, Bld: 230 mg/dL — ABNORMAL HIGH (ref 70–99)

## 2010-10-24 LAB — GLUCOSE, CAPILLARY
Glucose-Capillary: 199 mg/dL — ABNORMAL HIGH (ref 70–99)
Glucose-Capillary: 217 mg/dL — ABNORMAL HIGH (ref 70–99)

## 2010-10-24 LAB — CBC
HCT: 32.6 % — ABNORMAL LOW (ref 36.0–46.0)
Hemoglobin: 9.7 g/dL — ABNORMAL LOW (ref 12.0–15.0)
MCH: 28 pg (ref 26.0–34.0)
MCHC: 29.8 g/dL — ABNORMAL LOW (ref 30.0–36.0)
RDW: 14.9 % (ref 11.5–15.5)

## 2010-10-25 DIAGNOSIS — I4891 Unspecified atrial fibrillation: Secondary | ICD-10-CM

## 2010-10-25 LAB — CBC
MCH: 29.3 pg (ref 26.0–34.0)
MCHC: 31.1 g/dL (ref 30.0–36.0)
MCV: 94.3 fL (ref 78.0–100.0)
Platelets: 133 10*3/uL — ABNORMAL LOW (ref 150–400)

## 2010-10-25 LAB — GLUCOSE, CAPILLARY
Glucose-Capillary: 219 mg/dL — ABNORMAL HIGH (ref 70–99)
Glucose-Capillary: 221 mg/dL — ABNORMAL HIGH (ref 70–99)
Glucose-Capillary: 192 mg/dL — ABNORMAL HIGH (ref 70–99)

## 2010-10-25 LAB — BASIC METABOLIC PANEL
BUN: 19 mg/dL (ref 6–23)
Calcium: 8.7 mg/dL (ref 8.4–10.5)
Creatinine, Ser: 1.01 mg/dL (ref 0.4–1.2)
GFR calc Af Amer: >60 mL/min (ref >60)

## 2010-10-26 DIAGNOSIS — I4891 Unspecified atrial fibrillation: Secondary | ICD-10-CM

## 2010-10-26 LAB — CBC
HCT: 30.9 % — ABNORMAL LOW (ref 36.0–46.0)
MCHC: 30.4 g/dL (ref 30.0–36.0)
RDW: 14.9 % (ref 11.5–15.5)
WBC: 13.4 10*3/uL — ABNORMAL HIGH (ref 4.0–10.5)

## 2010-10-26 LAB — CK TOTAL AND CKMB (NOT AT ARMC)
CK, MB: 2.3 ng/mL (ref 0.3–4.0)
Relative Index: INVALID (ref 0.0–2.5)

## 2010-10-26 LAB — PROTIME-INR
INR: 1.84 — ABNORMAL HIGH (ref 0.00–1.49)
Prothrombin Time: 21.4 seconds — ABNORMAL HIGH (ref 11.6–15.2)

## 2010-10-26 LAB — APTT: aPTT: 38 seconds — ABNORMAL HIGH (ref 24–37)

## 2010-10-26 LAB — COMPREHENSIVE METABOLIC PANEL
Albumin: 2.7 g/dL — ABNORMAL LOW (ref 3.5–5.2)
BUN: 24 mg/dL — ABNORMAL HIGH (ref 6–23)
Calcium: 9 mg/dL (ref 8.4–10.5)
Creatinine, Ser: 1.06 mg/dL (ref 0.4–1.2)
Glucose, Bld: 202 mg/dL — ABNORMAL HIGH (ref 70–99)
Potassium: 3.3 mEq/L — ABNORMAL LOW (ref 3.5–5.1)
Total Protein: 6.4 g/dL (ref 6.0–8.3)

## 2010-10-26 LAB — GLUCOSE, CAPILLARY
Glucose-Capillary: 203 mg/dL — ABNORMAL HIGH (ref 70–99)
Glucose-Capillary: 213 mg/dL — ABNORMAL HIGH (ref 70–99)
Glucose-Capillary: 157 mg/dL — ABNORMAL HIGH (ref 70–99)
Glucose-Capillary: 189 mg/dL — ABNORMAL HIGH (ref 70–99)

## 2010-10-26 LAB — MRSA PCR SCREENING: MRSA by PCR: NEGATIVE

## 2010-10-26 LAB — BODY FLUID CULTURE: Culture: NO GROWTH

## 2010-10-26 LAB — CARDIAC PANEL(CRET KIN+CKTOT+MB+TROPI)
CK, MB: 5.7 ng/mL — ABNORMAL HIGH (ref 0.3–4.0)
Total CK: 138 U/L (ref 7–177)
Total CK: 107 U/L (ref 7–177)
Troponin I: 1.66 ng/mL (ref 0.00–0.06)
Troponin I: 2.06 ng/mL (ref 0.00–0.06)

## 2010-10-26 LAB — TROPONIN I: Troponin I: 0.03 ng/mL (ref 0.00–0.06)

## 2010-10-26 NOTE — Progress Notes (Signed)
Summary: ?  Phone Note From Other Clinic   Caller: [facility] Summary of Call: [facility] called regarding advisement for diabetic instructions for pt's upcoming surgery. Pt is having surgery 03/05 and needs diabetic instructions regarding her surgery. Pt is currently using an insulin pump Initial call taken by: Brenton Grills CMA Duncan Dull),  October 13, 2010 5:14 PM  Follow-up for Phone Call        pleasee move-up ov so we can address Follow-up by: Minus Breeding MD,  October 13, 2010 5:14 PM  Additional Follow-up for Phone Call Additional follow up Details #1::        Appointment scheduled 10/20/2010 1:30pm Additional Follow-up by: Brenton Grills CMA Duncan Dull),  October 18, 2010 10:04 AM

## 2010-10-26 NOTE — Assessment & Plan Note (Signed)
Summary: fu/lb   Vital Signs:  Patient profile:   77 year old female Height:      64 inches (162.56 cm) Weight:      155.50 pounds (70.68 kg) BMI:     26.79 O2 Sat:      92 % on Room air Temp:     98.0 degrees F (36.67 degrees C) oral Pulse rate:   60 / minute Pulse rhythm:   regular BP sitting:   124 / 62  (left arm) Cuff size:   regular  Vitals Entered By: Brenton Grills CMA Duncan Dull) (October 20, 2010 1:35 PM)  O2 Flow:  Room air CC: Follow up DM before surgery/aj Is Patient Diabetic? Yes Comments Pt is no longer taking Pletal   Referring Provider:  Dicky Doe Primary Provider:  Newt Lukes, MD  CC:  Follow up DM before surgery/aj.  History of Present Illness: she brings a record of her cbg's which i have reviewed today.  she says cbg's are higher recently.  it varies from 120-240, with no trend throughout the day.  she takes approx 80 units of humalog per day, via her pump.    Current Medications (verified): 1)  Clonidine Hcl 0.2 Mg Tabs (Clonidine Hcl) .Marland Kitchen.. 1 By Mouth Four Times A Day 2)  Metoprolol Tartrate 25 Mg Tabs (Metoprolol Tartrate) .... Take One Tablet By Mouth Two Times A Day 3)  Amlodipine Besylate 10 Mg Tabs (Amlodipine Besylate) .Marland Kitchen.. 1 By Mouth At Bedtime 4)  Pravachol 10 Mg Tabs (Pravastatin Sodium) .Marland Kitchen.. 1 By Mouth At Bedtime 5)  Humalog 100 Unit/ml Soln (Insulin Lispro (Human)) .... Sliding Scale 6)  Omeprazole 40 Mg Cpdr (Omeprazole) .Marland Kitchen.. 1 By Mouth  Every Morning 7)  Oscal 500/200 D-3 500-200 Mg-Unit Tabs (Calcium-Vitamin D) .Marland Kitchen.. 1 By Mouth Two Times A Day 8)  Aspir-Low 81 Mg Tbec (Aspirin) .... Take 2 At Bedtime 9)  Fish Oil 1200 Mg Caps (Omega-3 Fatty Acids) .... 2 Daily 10)  Cinnamon 500 Mg Tabs (Cinnamon) .... 4 Tabs Daily 11)  Vitamin D3 2000 Unit Caps (Cholecalciferol) .Marland Kitchen.. 1 Daily 12)  Coq-10 300 Mg Caps (Coenzyme Q10) .Marland Kitchen.. 1 Daily 13)  Vitamin C 500mg  .... 1 Once Daily 14)  Gabapentin 300 Mg Caps (Gabapentin) .... Take One Capsule By  Mouth Every Night As Needed 15)  Furosemide 40 Mg Tabs (Furosemide) .... 2 Tabs  Every Morning and 1 By Mouth At 2pm 16)  One A Day Womens Multivitamin .Marland Kitchen.. 1 By Mouth Once Daily 17)  Tylenol 325 Mg Tabs (Acetaminophen) .... As Needed 18)  Aleve 220 Mg Tabs (Naproxen Sodium) .... As Needed 19)  Losartan Potassium 100 Mg Tabs (Losartan Potassium) .Marland Kitchen.. 1 Tab Once Daily 20)  Methocarbamol 500 Mg Tabs (Methocarbamol) .Marland Kitchen.. 1 Tab Three Times A Day As Needed For Cramps 21)  Nystatin 100000 Unit/gm Powd (Nystatin) .... Apply To Affected Area 3-4 Times As Needed 22)  Tramadol Hcl 50 Mg Tabs (Tramadol Hcl) .Marland Kitchen.. 1po Q 6 Hrs As Needed Pain 23)  Macrodantin 100 Mg Caps (Nitrofurantoin Macrocrystal) .Marland Kitchen.. 1 By Mouth Two Times A Day X 7 Days 24)  Pletal 50 Mg Tabs (Cilostazol) .... Take One Tablet Two Times A Day  Allergies (verified): 1)  ! Tekturna (Aliskiren Fumarate) 2)  ! Diltiazem Hcl Er Beads 3)  ! * Sular 4)  ! * Minoridil 5)  ! * Fenofilrate 6)  ! * Capton 7)  ! * Hctz 8)  ! * Carevedilol 9)  ! *  Valturna 10)  ! * Tramadol Hcl 50 Mg  Past History:  Past Medical History: Last updated: 08/07/2010 Diabetes mellitus, type 2 hypertension dyslipidemia chronic lymphedema, LLE/venous insuff osteoarthritis - lumbar ddd, cervical, hip R>L PVD  - B iliac stents 2002 - ABI 05/2010 - R 0.55, L .75   also right SFA stenosis - med mgmt  MD roster:   endo -Mirenda Baltazar cards -adria ([facility]) ortho - olin/gioffre  Review of Systems  The patient denies hypoglycemia.    Physical Exam  General:  normal appearance.   Pulses:  dorsalis pedis intact bilaterally.   Extremities:  there is hyperpigmentation c/w chronic venous insufficiency.   no deformity.  no ulcer on the feet.  feet are of normal temp.   trace right pedal edema and trace left pedal edema.   mycotic toenails.   Neurologic:  sensation is intact to touch on the feet, but decreased from normal.    Impression &  Recommendations:  Problem # 1:  IDDM (ICD-250.01) she may need (outpatient) adjustment.  Other Orders: TLB-A1C / Hgb A1C (Glycohemoglobin) (83036-A1C) Est. Patient Level III (65784)  Patient Instructions: 1)  tests are being ordered for you today.  a few days after the test(s), please call [phone] to hear your test results. 2)  pending the test results, please continue basal rate of 1 unit/hr, 24 hrs per day.   3)  continue bolus of 15 units with each meal. 4)  continue correction bolus (which some people call "sensitivity," or "insulin sensitivity ratio," or just "isr") of 1 unit for each by 25 which your glucose exceeds 120. 5)  Please schedule a follow-up appointment in 3 months.  6)  for your upcoming right hip surgery, i advise you to be on intravenous insulin, with "glucose stabilizer," or similar algorhythm.  then resume pump therapy when you are eating again.  you may need a lower dosage at first, when you resume eating.     Orders Added: 1)  TLB-A1C / Hgb A1C (Glycohemoglobin) [83036-A1C] 2)  Est. Patient Level III [84132]

## 2010-10-27 LAB — BASIC METABOLIC PANEL
BUN: 27 mg/dL — ABNORMAL HIGH (ref 6–23)
CO2: 35 mEq/L — ABNORMAL HIGH (ref 19–32)
Calcium: 9.4 mg/dL (ref 8.4–10.5)
Glucose, Bld: 250 mg/dL — ABNORMAL HIGH (ref 70–99)
Sodium: 140 mEq/L (ref 135–145)

## 2010-10-27 LAB — GLUCOSE, CAPILLARY: Glucose-Capillary: 340 mg/dL — ABNORMAL HIGH (ref 70–99)

## 2010-10-27 NOTE — H&P (Signed)
NAME:  Emma Yu, Emma Yu NO.:  [id number]  MEDICAL RECORD NO.:  [id number]          PATIENT TYPE:  LOCATION:                                 FACILITY:  PHYSICIAN:  Madlyn Frankel. Charlann Boxer, M.D.       DATE OF BIRTH:  DATE OF ADMISSION: DATE OF DISCHARGE:                             HISTORY & PHYSICAL   ANTICIPATED DATE OF ADMISSION:  10/23/2010  CHIEF COMPLAINT:  Right hip pain.  HISTORY OF PRESENT ILLNESS:  Emma Yu is a pleasant 76 year old female, who had total hip done by Dr. Ophelia Charter back in 1994.  She unfortunately has now developed significant discomfort over the past year and is having difficulty ambulating.  She presented initially to Dr. Tinnie Gens in our practice and then to Dr. Charlann Boxer for evaluation.  X-rays of the hip did reveal possible failing of the right acetabulum with associated osteolysis and asymmetric poly-wear.  Patient does have a significant vascular history with bilateral iliac stents.  She was evaluated by Dr. Arbie Cookey, who states he did not feel like this would be a problem with proceeding with surgery.  Dr. Charlann Boxer did feel that the patient would benefit from acetabular revision with change in the foraminal head with re-alignment of the acetabular cup and new polyethylene insert.  Risks and benefits of the surgery were discussed with the patient.  Medical clearance was obtained again through vascular surgeon as well as her cardiologist and the patient does wish to proceed.  PAST MEDICAL HISTORY: 1. Hypertension. 2. Non-insulin-dependent diabetes.  The patient is currently on     insulin pump. 3. Peripheral vascular disease. 4. GERD. 5. History of previous DVT. 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  REVIEW OF MEDICATIONS: 1. Clonidine HCl 0.2 mg 1 p.o. q.i.d. 2. Metoprolol 25 mg 1 p.o. b.i.d. 3. Losartan potassium 100 mg 1 p.o. q.h.s. 4. Amlodipine 10 mg half tablet q.h.s. 5. Furosemide 40 mg 2 tablets q.a.m., 1 tablet at 2  p.m. 6. Pravastatin 10 mg 1 p.o. q.h.s. 7. Humalog U/100 p.r.n.  Again, patient is on insulin pump. 8. Omeprazole 40 mg 1 p.o. q.a.m. 9. Gabapentin 300 mg 1 p.o. q.h.s. 10.Cilostazol 50 mg 1 p.o. b.i.d. 11.Tramadol 50 mg 1 p.o. q.6 h.  OVER-THE-COUNTER MEDICATIONS:  Include: 1. Aspirin 81 mg daily. 2. Fish oil 1000 mg 2 tablets daily. 3. Cinnamon 500 mg 4 tablets daily. 4. D3 vitamin 2000 international units daily. 5. CoQ10 300 mg daily. 6. Vitamin C 500 mg daily. 7. Super B complex 1 tablet daily.  ALLERGIES:  The patient does have no significant allergies, but is sensitive to multiple blood pressure medication including: 1. TEKTURNA. 2. DILTIAZEM CD. 3. SULAR. 4. NARDIL. 5. FENOFIBRATE. 6. CAPOTEN. 7. HYDROCHLOROTHIAZIDE. 8. CARVEDILOL. 9. VALTURNA.  PREVIOUS SURGICAL HISTORY:  Includes: 1. Hysterectomy. 2. Cholecystectomy. 3. Lumbar decompression. 4. Right total hip invasive surgery in 1990, revision in 1994. 5. Bilateral iliac stents in 2002.  SOCIAL HISTORY:  The patient is widowed.  History negative for alcohol. She was previous a tobacco smoker.  Patient's plan is for nursing facility at discharge.  PRIMARY CARE PHYSICIAN:  __________  CARDIOLOGIST:  Emma Bears, MD  ENDOCRINOLOGIST:  Emma Dunker Everardo All, MD  Again, she saw Dr. Arbie Cookey for vascular clearance prior to surgery.  REVIEW OF SYSTEMS:  CONSTITUTIONAL:  Patient does note fatigue.  No fevers, chills.  She does note occasional night sweats.  HEENT:  The patient does note occasional blurred vision, hearing loss.  She does have dentures.  RESPIRATORY:  Patient has no shortness of breath on exertion; however this is unchanged.  No chest pain, angina. GU:  Patient has a urinary frequency with urination at night.  No discharge, retention.  Painful urination is noted.  GI:  No nausea, vomiting.  Patient does note periodic diarrhea, occasional heartburn. No melena.  MUSCULOSKELETAL:  As per the  HPI.  PHYSICAL EXAMINATION:  VITAL SIGNS:  Pulse is 80, respiratory rate 10, BP 140/70, weight is 154.8. GENERAL:  This is an elderly female, seen upright, in no acute distress, have a distress with slight antalgic gait utilizing a cane. NECK:  Supple.  No lymphadenopathy. CHEST:  Clear to auscultation bilaterally. HEART:  Patient does note 2/6 systolic murmur at the aortic area.  She has regular rate and rhythm.  No gallops or rubs. ABDOMEN:  Soft, nontender, nondistended.  Bowel sounds x4. SKIN:  No rashes or lesions are noted. EXTREMITIES:  In regard to the right lower extremity, there is mild lower extremity edema.  She does have pain with range of motion of the right hip.  DPs are equal, but slightly diminished.  DIAGNOSTIC STUDIES:  X-rays as read by Dr. Charlann Boxer showed vertically oriented cup with poly-wear.  IMPRESSION:  Failed right total hip arthroplasty.  PLAN:  The patient will be admitted to undergo revision right total hip arthroplasty.     Emma Yu, P.A.   ______________________________ Madlyn Frankel Charlann Boxer, M.D.    CS/MEDQ  D:  10/16/2010  T:  10/16/2010  Job:  [id number]  Electronically Signed by Emma Yu P.A. on 10/26/2010 03:35:59 PM Electronically Signed by Durene Romans M.D. on 10/27/2010 07:03:22 AM

## 2010-10-28 LAB — ANAEROBIC CULTURE: Gram Stain: NONE SEEN

## 2010-10-28 LAB — URINALYSIS, DIPSTICK ONLY
Glucose, UA: 250 mg/dL — AB
Hgb urine dipstick: NEGATIVE
Ketones, ur: NEGATIVE mg/dL
Leukocytes, UA: NEGATIVE
Nitrite: NEGATIVE
Protein, ur: 30 mg/dL — AB

## 2010-10-28 LAB — GLUCOSE, CAPILLARY
Glucose-Capillary: 235 mg/dL — ABNORMAL HIGH (ref 70–99)
Glucose-Capillary: 250 mg/dL — ABNORMAL HIGH (ref 70–99)

## 2010-10-29 LAB — GLUCOSE, CAPILLARY
Glucose-Capillary: 232 mg/dL — ABNORMAL HIGH (ref 70–99)
Glucose-Capillary: 240 mg/dL — ABNORMAL HIGH (ref 70–99)
Glucose-Capillary: 191 mg/dL — ABNORMAL HIGH (ref 70–99)
Glucose-Capillary: 197 mg/dL — ABNORMAL HIGH (ref 70–99)

## 2010-10-30 LAB — CBC
Hemoglobin: 10.5 g/dL — ABNORMAL LOW (ref 12.0–15.0)
MCH: 29.2 pg (ref 26.0–34.0)
MCHC: 31.3 g/dL (ref 30.0–36.0)
Platelets: 304 10*3/uL (ref 150–400)
RDW: 15.7 % — ABNORMAL HIGH (ref 11.5–15.5)

## 2010-10-30 LAB — COMPREHENSIVE METABOLIC PANEL
AST: 27 U/L (ref 0–37)
CO2: 34 mEq/L — ABNORMAL HIGH (ref 19–32)
Calcium: 9.8 mg/dL (ref 8.4–10.5)
Creatinine, Ser: 1.24 mg/dL — ABNORMAL HIGH (ref 0.4–1.2)
GFR calc Af Amer: 50 mL/min — ABNORMAL LOW (ref >60)
GFR calc non Af Amer: 42 mL/min — ABNORMAL LOW (ref >60)
Total Protein: 6.6 g/dL (ref 6.0–8.3)

## 2010-10-30 LAB — GLUCOSE, CAPILLARY
Glucose-Capillary: 208 mg/dL — ABNORMAL HIGH (ref 70–99)
Glucose-Capillary: 130 mg/dL — ABNORMAL HIGH (ref 70–99)

## 2010-10-30 LAB — T4, FREE: Free T4: 1.02 ng/dL (ref 0.80–1.80)

## 2010-10-30 LAB — TSH: TSH: 1.484 u[IU]/mL (ref 0.350–4.500)

## 2010-10-31 LAB — GLUCOSE, CAPILLARY
Glucose-Capillary: 167 mg/dL — ABNORMAL HIGH (ref 70–99)
Glucose-Capillary: 220 mg/dL — ABNORMAL HIGH (ref 70–99)

## 2010-11-02 NOTE — Discharge Summary (Signed)
NAME:  Emma Yu, Emma Yu            ACCOUNT NO.:  [id number]  MEDICAL RECORD NO.:  [id number]           PATIENT TYPE:  I  LOCATION:  1222                         FACILITY:  [facility]  PHYSICIAN:  Madlyn Frankel. Charlann Boxer, M.D.  DATE OF BIRTH:  Apr 11, 1927  DATE OF ADMISSION:  10/23/2010 DATE OF DISCHARGE:                              DISCHARGE SUMMARY   ADMITTING DIAGNOSIS:  Failed right hip with persistent pain with failed acetabulum with asymmetric polyethylene wear.  DISCHARGE DIAGNOSES: 1. 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 2. Acute onset atrial fibrillation with rapid ventricular response     with a concern for underlying myocardial event requiring     defibrillation to return her to normal sinus rhythm. 3. Hypertension. 4. Insulin-dependent diabetes. 5. Peripheral vascular disease. 6. History of reflux disease. 7. History of previous deep vein thrombosis, not on chronic Coumadin.  ADMITTING HISTORY:  Emma Yu is an 77 year old female who had a history of a right total-hip replacement in 1994.  She developed significant discomfort over the past few years having a difficult time ambulating.  There was concern whether or not the etiology was the hip or her back. There was concern about acetabular wear and potential protrusion in the setting of asymmetric polyethylene wear.  After reviewing with her the risks and benefits, consent was obtained for the procedure.  HOSPITAL COURSE:  The patient was admitted for same-day surgery on October 23, 2010.  She underwent a revision right total-hip acetabular component that was uncomplicated.  She was transferred to the recovery room and subsequently the Orthopedic floor.  On postoperative day #1 her Hemovac drain was removed and her Foley was removed.  Her laboratories on postoperative day #1 revealed a hematocrit of 32.6 and electrolytes that were stable.  She had no complaints  and no chest pain.  She was seen and evaluated by Physical Therapy.  I had asked that Physical Therapy have her limited on weightbearing to 50% to allow for acetabular cup ingrowth and prevent excessive torque.  The plan was for her to go to a skilled nursing facility for recovery.  By postoperative day #2 her hematocrit was 28.3 and electrolytes remained stable.  She had some complaints of urinary incontinence and so we recommended Depends, but no other issues.  She was placed on a regular diet.  PT clarification was made based on initial orders for her to be 25% weightbearing and work on transfers to again prevent excessive stress.  The plan was for her to potentially be going to the nursing facility on October 26, 2010.  However, in the early hours of October 26, 2010, she was noted to have asymptomatic rapid heart rate.  The Rapid Response Team was contacted as was Cardiology, as she has been a patient through [facility].  She was noted to be in acute atrial fibrillation with rapid ventricular response and was transferred to the Intensive Care Unit.  In the Intensive Care Unit she did receive some nitroglycerin with a drop in pressure.  She was seen and evaluated by Dr. Carolynn Comment, who subsequently performed a  single shock at 150 joules. She was shocked at that point back to normal sinus rhythm.  She was started on a Cardizem drip at the time.  Orthopedically she remained stable at this point.  She was seen a little bit by Physical Therapy in the Intensive Care Unit.  By October 27, 2010, she was stable and actually sitting up in the bed.  She was off all Cardizem drips and was in normal sinus rhythm, albeit a bit tachycardic.  At the time of this note, the plan was to perhaps get her to transfer to Telemetry with a work to get her to a nursing facility at the beginning part of the week if remaining stable.  DISCHARGE INSTRUCTIONS:  Orthopedic instructions would be that she  would still be 25% weightbearing on the right lower extremity with transfers to chair or to wheelchair until further directed in the office.  Her right hip wound dressing should be removed by October 30, 2010 or October 31, 2010, and subsequent application of a dry gauze and tape.  She should return to [facility] in the week of March 19.  Any orthopedic questions can be addressed to [facility] at [phone].  Otherwise cardiac issues can be addressed through [facility] Cardiology with her cardiac physician.  DISCHARGE MEDICATIONS:  Her discharge medications and list will be updated at the time of the final discharge summary, but at this point include: 1. Amiodarone, dosage to be determined by Cardiology at the time of     discharge. 2. Amlodipine 5 mg q.h.s. 3. Vitamin C 500 mg daily. 4. Vitamin D3 2000 units daily. 5. Colace 100 mg p.o. b.i.d. while on pain medicine. 6. MiraLax 17 grams p.o. daily as needed for constipation while on     pain medicine. 7. Iron 325 mg b.i.d. for two weeks. 8. Lasix 40-80 mg daily. 9. Neurontin 300 mg q.h.s. 10.Insulin pump per the patient. 11.Losartan 100 mg q.h.s. 12.Metanx one tablet p.o. daily. 13.Metoprolol 25 mg b.i.d. 14.Fish oil 2 grams b.i.d. 15.Protonix 80 mg daily. 16.Aspirin 325 mg p.o. b.i.d. 17.Zocor 5 mg p.o. q.h.s. 18.Norco 5/325 one to two tablets every 4-6 hours as needed for pain. 19.Robaxin 500 mg p.o. q.6 hours for muscle spasm and pain.     Madlyn Frankel Charlann Boxer, M.D.     MDO/MEDQ  D:  10/27/2010  T:  10/27/2010  Job:  [id number]  Electronically Signed by Durene Romans M.D. on 11/02/2010 12:11:05 PM

## 2010-11-02 NOTE — H&P (Signed)
NAME:  KENDEL, PESNELL NO.:  [id number]  MEDICAL RECORD NO.:  [id number]           PATIENT TYPE:  I  LOCATION:  1222                         [facility]ACILITY[facility]  PHYSICIAN:  Rollene Rotunda, MD, FACCDATE OF BIRTH:  11-16-1926  DATE OF ADMISSION:  10/23/2010 DATE OF DISCHARGE:                             HISTORY & PHYSICAL   REFERRING PHYSICIAN:  Madlyn Frankel. Charlann Boxer, MD  CARDIOLOGIST:  Lorine Bears, MD  REASON FOR CONSULTATION:  Evaluate the patient with atrial fibrillation.  HISTORY OF PRESENT ILLNESS:  The patient is a pleasant 77 year old with a history of peripheral vascular disease and some mild aortic stenosis. [name] status post revision of a total right hip that was done on the fifth.  Early this morning, she developed rapid onset atrial fibrillation.  She apparently had 8/10 chest discomfort.  EKG at this time demonstrated 4 to 5 mm lateral ST depression.  She was given one sublingual nitroglycerin and actually developed systolic pressure of 60. She was treated initially by rapid response and ER physician with IV amiodarone.  There was some mention of a Cardizem allergy; however, her rate continued to be in the 150s.  She actually was pain free with fluctuating blood pressures.  She has not had atrial fibrillation in the past.  She has had no coronary artery disease, though she has peripheral disease as described below.  She did see Dr. Kirke Corin recently and had a stress perfusion study.  I do not see an ejection fraction listed, but there was some questionable mild apical ischemia.  It was felt to be low risk and she underwent her surgery without complications.  She has been limited recently by her hip pain.  If she tries to make the bed, she will get some discomfort in her back, but no chest discomfort.  She does not describe any new shortness of breath, PND, or orthopnea.  PAST MEDICAL HISTORY: 1. Peripheral vascular disease (0.75 left ABI, 0.55 on the  right.  She     has right superficial femoral artery stenosis). 2. Mild aortic stenosis. 3. Type 2 diabetes mellitus. 4. Hypertension. 5. Dyslipidemia. 6. Degenerative joint disease. 7. Previous DVT. 8. GERD.  PAST SURGICAL HISTORY:  Total hip replacement by Dr. Ophelia Charter in 1994. (She reports 3 surgeries on the same hip), back surgery, hysterectomy, cholecystectomy, apparent stenting of her lower extremity illness, hemicolectomy.  ALLERGIES/INTOLERANCES:  DILTIAZEM CD (bradycardia), HYDROCHLOROTHIAZIDE, FENOFIBRATE, SULAR, COREG, TEKTURNA, VALSARTAN.  MEDICATIONS: 1. Protonix. 2. Metoprolol 25 mg b.i.d. 3. Simvastatin 5 mg at bedtime. 4. Lasix 80 mg daily. 5. Losartan 100 mg at bedtime. 6. Rivaroxaban. 7. Iron. 8. Amlodipine 5 mg daily. 9. Clonidine 0.2 mg t.i.d. 10.Insulin pump. 11.Robaxin p.r.n.  SOCIAL HISTORY:  The patient is widowed.  She did smoke previously, but quit.  FAMILY HISTORY:  Noncontributory for early coronary artery disease.  REVIEW OF SYSTEMS:  As stated in HPI and negative for all other systems.  PHYSICAL EXAMINATION:  GENERAL:  The patient is in mild distress with a rapid rate. VITAL SIGNS:  Blood pressure 110/70, heart rate 150s and irregular. HEENT:  Eyelids unremarkable.  Pupils equal, round,  and reactive to light.  Fundi not visualized.  Oral mucosa edentulous. NECK:  No jugular venous distention at 45 degrees.  Carotid upstroke brisk and symmetrical.  No bruits or thyromegaly. LYMPHATICS:  No cervical, axillary, or inguinal adenopathy. LUNGS:  Decreased breath sounds bilaterally. BACK:  No costovertebral angle tenderness. CHEST:  Unremarkable. HEART:  PMI not displaced or sustained.  Distant heart sounds.  S1 and S2 within normal limits.  No S3, no obvious murmurs, no clicks, no rubs. ABDOMEN:  Flat, positive bowel sounds, normal in frequency and pitch. No bruits, no rebound, no guarding, no midline pulsatile mass, no organomegaly. SKIN:   No rashes, no nodules. EXTREMITIES:  2+ upper pulses, diminished dorsalis pedis and posterior tibialis bilaterally. NEURO:  Oriented to person, place, and time.  Cranial nerves II through XII grossly intact, motor grossly intact.  EKG as above.  ASSESSMENT/PLAN: 1. Atrial fibrillation.  The patient has atrial fibrillation with     rapid rate and some unstable symptoms.  This has not been     controlled with low dose of IV Cardizem and a bolus of amiodarone     and drip.  Therefore, I plan cardioversion.  I have discussed with     the patient.  She understands and agrees to proceed.  Anesthesia is     not available for sedation.  I would rather use something short     acting so I will ask the ER to push etomidate. 2. Chest pain.  The patient does have chest pain.  I will cycle     enzymes.  I think she is going to need cardiac catheterization at     some point given the mildly abnormal stress test and the discomfort     and EKG changes.  However, this can be done electively unless she     has continued symptoms. 3. Hypotension.  I will hold her clonidine.  We will hold her Cozaar     if her blood pressure is less than 110.  These can be restarted as     her blood pressure recovers. 4. Peripheral vascular disease per Dr. Kirke Corin.     Rollene Rotunda, MD, [facility]     JH/MEDQ  D:  10/26/2010  T:  10/26/2010  Job:  [id number]  Electronically Signed by Rollene Rotunda MD [facility] on 11/02/2010 08:01:32 PM

## 2010-11-02 NOTE — Discharge Summary (Signed)
  NAME:  Emma Yu, Emma Yu            ACCOUNT NO.:  [id number]  MEDICAL RECORD NO.:  [id number]           PATIENT TYPE:  I  LOCATION:  1437                         FACILITY[facility]  [facility]  PHYSICIAN:  Madlyn Frankel. Charlann Boxer, M.D.  DATE OF BIRTH:  May 18, 1927  DATE OF ADMISSION:  10/23/2010 DATE OF DISCHARGE:  10/30/2010                        DISCHARGE SUMMARY - REFERRING   ADDENDUM  ADMITTING DISCHARGE DIAGNOSES:  Please see previous discharge summary.  BRIEF HISTORY:  Please see summary.  HOSPITAL COURSE:  The patient was last seen by Dr. Charlann Boxer on Friday, March 9.  She remained in the hospital over the weekend on the Telemetry floor due to her cardiac arrhythmias.  On Saturday she was doing well and had been placed on amiodarone for atrial fibrillation with rapid response. Her rate had slowed and she was progressing with her therapy.  She was seen on Sunday and followed by the weekend coverage for Orthopedics and Cardiology.  She was requiring moderate assistance and it was felt that she would still benefit from undergoing a skilled facility.  She was seen back on Monday on October 30, 2010, by Cardiology and felt that she was stable from a cardiac standpoint.  They recommend continuing on the amiodarone.  From a therapy standpoint, she was slowly progressing and felt to be a good candidate.  She was looking into a skilled facility. We are waiting on that offer and final availability.  If it was available, then she would be discharged out at that time.  DISCHARGE PLAN:  The patient will be transferred to a skilled nursing facility of choice.  DISCHARGE DIAGNOSES:  Please see original discharge summary.  DISCHARGE MEDICATIONS:  Please see original discharge summary but also add to amiodarone 200 mg two tablets every morning for 2 weeks and then 1 tablet every morning.  Continue the remaining discharge medications.  FOLLOWUP:  She needs to follow up with Dr. Rollene Rotunda in 3 weeks following  discharge from the hospital.  She will need to follow up with Dr. Durene Romans at [facility] on the week of March 19.  DISPOSITION:  Pending at the time of dictation.  CONDITION ON DISCHARGE:  Slowly improving.     Alexzandrew L. Perkins, P.A.C.   ______________________________ Madlyn Frankel Charlann Boxer, M.D.    ALP/MEDQ  D:  10/30/2010  T:  10/30/2010  Job:  [id number]  cc:   Vikki Ports A. Felicity Coyer, MD [address] [place], [place] [address]  Rollene Rotunda, MD, [facility] [address] [place] [address]  Electronically Signed by Patrica Duel P.A.C. on 10/31/2010 10:26:53 AM Electronically Signed by Durene Romans M.D. on 11/02/2010 12:11:10 PM

## 2010-11-02 NOTE — Procedures (Signed)
  NAME:  Emma Yu, Emma Yu            ACCOUNT NO.:  [id number]  MEDICAL RECORD NO.:  [id number]           PATIENT TYPE:  I  LOCATION:  1222                         FACILITY:  [facility]  PHYSICIAN:  Rollene Rotunda, MD, FACCDATE OF BIRTH:  09/07/1926  DATE OF PROCEDURE:  10/26/2010 DATE OF DISCHARGE:                           CARDIAC CATHETERIZATION   PROCEDURE:  DC cardioversion.  INDICATION:  Emergent for rapid atrial fibrillation with unstable symptoms.  PROCEDURE NOTE:  The patient did sign informed consent.  She developed atrial fibrillation earlier this morning, so anticoagulation was not initiated.  [name] actually postop from her hip revision.  She was treated with fentanyl 25 mcg and 2.5 mg of etomidate by Dr. Clarene Duke of the Emergency Room.  Her airway was watched and she maintained excellent oxygenation.  Pressures were maintained.  I cardioverted her x1 with 150 joules of biphasic synchronized energy.  This resulted in normal sinus rhythm.  Post procedure EKG demonstrated much improvement in previous ST- segment depression.  [name] pain free and hemodynamically stable.  COMPLICATIONS:  None.     Rollene Rotunda, MD, [facility]     JH/MEDQ  D:  10/26/2010  T:  10/26/2010  Job:  [id number]  Electronically Signed by Rollene Rotunda MD [facility] on 11/02/2010 08:01:35 PM

## 2010-11-04 ENCOUNTER — Emergency Department (HOSPITAL_COMMUNITY): Payer: Medicare Other

## 2010-11-04 ENCOUNTER — Inpatient Hospital Stay (HOSPITAL_COMMUNITY)
Admission: EM | Admit: 2010-11-04 | Discharge: 2010-11-13 | DRG: 682 | Disposition: A | Discharge status: Skilled Nursing Facility | Payer: Medicare Other | Attending: Internal Medicine | Admitting: Internal Medicine

## 2010-11-04 DIAGNOSIS — K219 Gastro-esophageal reflux disease without esophagitis: Secondary | ICD-10-CM | POA: Diagnosis present

## 2010-11-04 DIAGNOSIS — Z794 Long term (current) use of insulin: Secondary | ICD-10-CM

## 2010-11-04 DIAGNOSIS — E46 Unspecified protein-calorie malnutrition: Secondary | ICD-10-CM | POA: Diagnosis present

## 2010-11-04 DIAGNOSIS — I498 Other specified cardiac arrhythmias: Secondary | ICD-10-CM | POA: Diagnosis present

## 2010-11-04 DIAGNOSIS — Z79899 Other long term (current) drug therapy: Secondary | ICD-10-CM

## 2010-11-04 DIAGNOSIS — N179 Acute kidney failure, unspecified: Principal | ICD-10-CM | POA: Diagnosis present

## 2010-11-04 DIAGNOSIS — T448X5A Adverse effect of centrally-acting and adrenergic-neuron-blocking agents, initial encounter: Secondary | ICD-10-CM | POA: Diagnosis present

## 2010-11-04 DIAGNOSIS — Z9641 Presence of insulin pump (external) (internal): Secondary | ICD-10-CM

## 2010-11-04 DIAGNOSIS — I10 Essential (primary) hypertension: Secondary | ICD-10-CM | POA: Diagnosis present

## 2010-11-04 DIAGNOSIS — T462X5A Adverse effect of other antidysrhythmic drugs, initial encounter: Secondary | ICD-10-CM | POA: Diagnosis present

## 2010-11-04 DIAGNOSIS — R579 Shock, unspecified: Secondary | ICD-10-CM

## 2010-11-04 DIAGNOSIS — I359 Nonrheumatic aortic valve disorder, unspecified: Secondary | ICD-10-CM | POA: Diagnosis present

## 2010-11-04 DIAGNOSIS — Z7982 Long term (current) use of aspirin: Secondary | ICD-10-CM

## 2010-11-04 DIAGNOSIS — I4891 Unspecified atrial fibrillation: Secondary | ICD-10-CM | POA: Diagnosis present

## 2010-11-04 DIAGNOSIS — M1990 Unspecified osteoarthritis, unspecified site: Secondary | ICD-10-CM | POA: Diagnosis present

## 2010-11-04 DIAGNOSIS — E119 Type 2 diabetes mellitus without complications: Secondary | ICD-10-CM | POA: Diagnosis present

## 2010-11-04 DIAGNOSIS — B9689 Other specified bacterial agents as the cause of diseases classified elsewhere: Secondary | ICD-10-CM | POA: Diagnosis present

## 2010-11-04 DIAGNOSIS — J9600 Acute respiratory failure, unspecified whether with hypoxia or hypercapnia: Secondary | ICD-10-CM | POA: Diagnosis not present

## 2010-11-04 DIAGNOSIS — I739 Peripheral vascular disease, unspecified: Secondary | ICD-10-CM | POA: Diagnosis present

## 2010-11-04 DIAGNOSIS — Z96649 Presence of unspecified artificial hip joint: Secondary | ICD-10-CM

## 2010-11-04 DIAGNOSIS — Z86718 Personal history of other venous thrombosis and embolism: Secondary | ICD-10-CM

## 2010-11-04 DIAGNOSIS — R4182 Altered mental status, unspecified: Secondary | ICD-10-CM

## 2010-11-04 DIAGNOSIS — N3000 Acute cystitis without hematuria: Secondary | ICD-10-CM

## 2010-11-04 DIAGNOSIS — I959 Hypotension, unspecified: Secondary | ICD-10-CM | POA: Diagnosis present

## 2010-11-04 DIAGNOSIS — N390 Urinary tract infection, site not specified: Secondary | ICD-10-CM | POA: Diagnosis present

## 2010-11-04 DIAGNOSIS — D638 Anemia in other chronic diseases classified elsewhere: Secondary | ICD-10-CM | POA: Diagnosis present

## 2010-11-04 DIAGNOSIS — E785 Hyperlipidemia, unspecified: Secondary | ICD-10-CM | POA: Diagnosis present

## 2010-11-04 LAB — POCT I-STAT 3, ART BLOOD GAS (G3+)
Acid-Base Excess: 2 mmol/L (ref 0.0–2.0)
O2 Saturation: 82 %
TCO2: 28 mmol/L (ref 0–100)
pCO2 arterial: 46.2 mmHg — ABNORMAL HIGH (ref 35.0–45.0)

## 2010-11-04 LAB — POCT I-STAT, CHEM 8
Chloride: 98 mEq/L (ref 96–112)
Creatinine, Ser: 4 mg/dL — ABNORMAL HIGH (ref 0.4–1.2)
HCT: 26 % — ABNORMAL LOW (ref 36.0–46.0)
Hemoglobin: 8.8 g/dL — ABNORMAL LOW (ref 12.0–15.0)
Potassium: 4.4 mEq/L (ref 3.5–5.1)
Sodium: 129 mEq/L — ABNORMAL LOW (ref 135–145)

## 2010-11-04 LAB — CBC
MCH: 30.1 pg (ref 26.0–34.0)
Platelets: 351 10*3/uL (ref 150–400)
RBC: 2.82 MIL/uL — ABNORMAL LOW (ref 3.87–5.11)
RDW: 18 % — ABNORMAL HIGH (ref 11.5–15.5)
WBC: 10.4 10*3/uL (ref 4.0–10.5)

## 2010-11-04 LAB — TYPE AND SCREEN: Antibody Screen: NEGATIVE

## 2010-11-04 LAB — POCT CARDIAC MARKERS: Troponin i, poc: <0.05 ng/mL (ref 0.00–0.09)

## 2010-11-04 LAB — CK TOTAL AND CKMB (NOT AT ARMC)
CK, MB: 4.4 ng/mL — ABNORMAL HIGH (ref 0.3–4.0)
Relative Index: 1.7 (ref 0.0–2.5)
Total CK: 266 U/L — ABNORMAL HIGH (ref 7–177)

## 2010-11-04 LAB — MRSA PCR SCREENING: MRSA by PCR: NEGATIVE

## 2010-11-04 LAB — BASIC METABOLIC PANEL
BUN: 95 mg/dL — ABNORMAL HIGH (ref 6–23)
Chloride: 92 mEq/L — ABNORMAL LOW (ref 96–112)
Creatinine, Ser: 3.59 mg/dL — ABNORMAL HIGH (ref 0.4–1.2)
Glucose, Bld: 82 mg/dL (ref 70–99)
Potassium: 4.4 mEq/L (ref 3.5–5.1)

## 2010-11-04 LAB — ABO/RH: ABO/RH(D): O NEG

## 2010-11-04 LAB — URINALYSIS, ROUTINE W REFLEX MICROSCOPIC
Bilirubin Urine: NEGATIVE
Glucose, UA: NEGATIVE mg/dL
Hgb urine dipstick: NEGATIVE
Specific Gravity, Urine: 1.019 (ref 1.005–1.030)
Urobilinogen, UA: 0.2 mg/dL (ref 0.0–1.0)
pH: 5 (ref 5.0–8.0)

## 2010-11-04 LAB — DIFFERENTIAL
Basophils Relative: 0 % (ref 0–1)
Eosinophils Absolute: 0.2 10*3/uL (ref 0.0–0.7)
Eosinophils Relative: 2 % (ref 0–5)
Neutrophils Relative %: 75 % (ref 43–77)

## 2010-11-04 LAB — URINE MICROSCOPIC-ADD ON

## 2010-11-04 LAB — MAGNESIUM: Magnesium: 2.8 mg/dL — ABNORMAL HIGH (ref 1.5–2.5)

## 2010-11-04 LAB — BRAIN NATRIURETIC PEPTIDE: Pro B Natriuretic peptide (BNP): 446 pg/mL — ABNORMAL HIGH (ref 0.0–100.0)

## 2010-11-04 LAB — GLUCOSE, CAPILLARY: Glucose-Capillary: 127 mg/dL — ABNORMAL HIGH (ref 70–99)

## 2010-11-04 LAB — PHOSPHORUS: Phosphorus: 6 mg/dL — ABNORMAL HIGH (ref 2.3–4.6)

## 2010-11-04 LAB — APTT: aPTT: 24 seconds (ref 24–37)

## 2010-11-05 ENCOUNTER — Inpatient Hospital Stay (HOSPITAL_COMMUNITY): Payer: Medicare Other

## 2010-11-05 DIAGNOSIS — J9600 Acute respiratory failure, unspecified whether with hypoxia or hypercapnia: Secondary | ICD-10-CM

## 2010-11-05 LAB — POCT I-STAT 3, ART BLOOD GAS (G3+)
Acid-base deficit: 1 mmol/L (ref 0.0–2.0)
Acid-base deficit: 2 mmol/L (ref 0.0–2.0)
O2 Saturation: 83 %
O2 Saturation: 99 %
Patient temperature: 98.6
Patient temperature: 99
Patient temperature: 37.7
TCO2: 25 mmol/L (ref 0–100)
pH, Arterial: 7.378 (ref 7.350–7.400)
pH, Arterial: 7.407 — ABNORMAL HIGH (ref 7.350–7.400)
pO2, Arterial: 52 mmHg — ABNORMAL LOW (ref 80.0–100.0)

## 2010-11-05 LAB — CARDIAC PANEL(CRET KIN+CKTOT+MB+TROPI)
CK, MB: 4.1 ng/mL — ABNORMAL HIGH (ref 0.3–4.0)
CK, MB: 4 ng/mL (ref 0.3–4.0)
Relative Index: 1.6 (ref 0.0–2.5)
Relative Index: 1.5 (ref 0.0–2.5)
Total CK: 261 U/L — ABNORMAL HIGH (ref 7–177)
Total CK: 259 U/L — ABNORMAL HIGH (ref 7–177)

## 2010-11-05 LAB — CBC
HCT: 27 % — ABNORMAL LOW (ref 36.0–46.0)
MCV: 94.4 fL (ref 78.0–100.0)
Platelets: 325 10*3/uL (ref 150–400)
RBC: 2.86 MIL/uL — ABNORMAL LOW (ref 3.87–5.11)
RDW: 18.4 % — ABNORMAL HIGH (ref 11.5–15.5)
WBC: 11.6 10*3/uL — ABNORMAL HIGH (ref 4.0–10.5)

## 2010-11-05 LAB — GLUCOSE, CAPILLARY
Glucose-Capillary: 136 mg/dL — ABNORMAL HIGH (ref 70–99)
Glucose-Capillary: 135 mg/dL — ABNORMAL HIGH (ref 70–99)
Glucose-Capillary: 148 mg/dL — ABNORMAL HIGH (ref 70–99)
Glucose-Capillary: 181 mg/dL — ABNORMAL HIGH (ref 70–99)
Glucose-Capillary: 165 mg/dL — ABNORMAL HIGH (ref 70–99)

## 2010-11-05 LAB — COMPREHENSIVE METABOLIC PANEL
Albumin: 2.5 g/dL — ABNORMAL LOW (ref 3.5–5.2)
BUN: 92 mg/dL — ABNORMAL HIGH (ref 6–23)
Calcium: 8.2 mg/dL — ABNORMAL LOW (ref 8.4–10.5)
Glucose, Bld: 138 mg/dL — ABNORMAL HIGH (ref 70–99)
Potassium: 5.1 mEq/L (ref 3.5–5.1)
Sodium: 135 mEq/L (ref 135–145)
Total Protein: 5.9 g/dL — ABNORMAL LOW (ref 6.0–8.3)

## 2010-11-05 LAB — PHOSPHORUS: Phosphorus: 6.2 mg/dL — ABNORMAL HIGH (ref 2.3–4.6)

## 2010-11-05 LAB — BASIC METABOLIC PANEL
BUN: 95 mg/dL — ABNORMAL HIGH (ref 6–23)
Creatinine, Ser: 2.88 mg/dL — ABNORMAL HIGH (ref 0.4–1.2)
GFR calc non Af Amer: 16 mL/min — ABNORMAL LOW (ref >60)
Glucose, Bld: 150 mg/dL — ABNORMAL HIGH (ref 70–99)
Potassium: 4.7 mEq/L (ref 3.5–5.1)

## 2010-11-05 LAB — LEGIONELLA ANTIGEN, URINE

## 2010-11-05 LAB — MAGNESIUM: Magnesium: 2.6 mg/dL — ABNORMAL HIGH (ref 1.5–2.5)

## 2010-11-05 NOTE — Consult Note (Signed)
NAME:  Emma Yu, Emma Yu            ACCOUNT NO.:  [id number]  MEDICAL RECORD NO.:  [id number]           PATIENT TYPE:  I  LOCATION:  2301                         FACILITY:  [facility]  PHYSICIAN:  Armanda Magic, M.D.     DATE OF BIRTH:  [birth year]  DATE OF CONSULTATION:  11/04/2010 DATE OF DISCHARGE:                                CONSULTATION   REFERRING PHYSICIAN:  Azalia Bilis, MD  CHIEF COMPLAINT:  Junctional rhythm with mental status changes.  HISTORY OF PRESENT ILLNESS:  This is an 77 year old female with a history of peripheral vascular disease, atrial fibrillation, mild aortic stenosis, and recent hospitalization on October 24, 2010, with right hip pain from a failed right total hip arthroplasty and underwent revision. On October 26, 2010, she developed atrial fibrillation with rapid ventricular response and chest pain with lateral ST depression.  She became hypotensive with persistent atrial fibrillation with rapid ventricular response despite IV amiodarone and Cardizem and underwent emergent cardioversion, and subsequently was discharged to home on amiodarone and beta-blockers with plans for possible outpatient followup with possible cath in the future due to chest pain with EKG changes during her rapid AFib.  Today, she presented to the emergency room with complaints of mental status changes and was found to be in acute renal failure with normal sinus rhythm, intermittent episodes of junctional bradycardia.  We are now asked to consult.  The patient denies any current chest pain or shortness of breath.  PAST MEDICAL HISTORY:  Paroxysmal atrial fibrillation.  Currently, on amiodarone and beta-blocker, peripheral vascular disease with right superficial femoral artery stenosis, mild aortic stenosis, type 2 diabetes mellitus, hypertension, dyslipidemia, DJD, previous DVT and GERD.  PAST SURGICAL HISTORY:  Status post total hip replacement with three surgeries on her hip, back  surgery, total abdominal hysterectomy, cholecystectomy, hemicolectomy and questionable lower extremity stent.  ALLERGIES:  DILTIAZEM which causes bradycardia, HYDROCHLOROTHIAZIDE, FENOFIBRATE, SULAR, COREG, TEKTURNA, and  VALSARTAN.  SOCIAL HISTORY:  [name] widowed.  She has a remote history of tobacco use in the past.  FAMILY HISTORY:  There is no history of early CAD.  MEDICATIONS: 1. Amiodarone 200 mg 2 tablets daily for 2 weeks and then 1 tablet     daily.  This was started on October 26, 2010. 2. Amlodipine 5 mg daily. 3. Vitamin C 500 mg daily. 4. Vitamin D3 2000 units daily. 5. Colace 100 mg b.i.d. 6. MiraLax p.r.n. 7. Aspirin 325 mg b.i.d. x2 weeks. 8. Lasix 40-80 mg daily. 9. Neurontin 300 mg at bedtime. 10.Insulin pump. 11.Losartan 100 mg at bedtime. 12.Metoprolol 25 mg b.i.d. 13.Fish oil 2 g b.i.d. 14.Protonix 80 mg daily. 15.Aspirin 325 mg b.i.d. 16.Zocor. 17.Norco. 18.Robaxin 500 mg at bedtime.  REVIEW OF SYSTEMS:  Other than what is stated in the HPI is negative.  PHYSICAL EXAMINATION:  VITAL SIGNS:  Blood pressure 73/52, heart rate is 42. GENERAL:  [name] a well-developed, well-nourished female, in no acute distress, lying comfortably in bed. HEENT:  Benign. NECK:  Supple without lymphadenopathy.  Carotid upstrokes are +2 bilaterally.  No bruits. LUNGS:  Clear to auscultation throughout. HEART:  Regular rate and rhythm  but bradycardic. ABDOMEN:  Soft, nontender, nondistended.  Normoactive bowel sounds.  No hepatosplenomegaly. EXTREMITIES:  No cyanosis, erythema or edema.  LABORATORY DATA:  Sodium 129, potassium 4.4, chloride 92, bicarb 28, BUN 95, creatinine 0.352.  White cell count 10.4, hemoglobin 8.5, hematocrit 26.2, platelet count 351.  Magnesium 2.8, CPK-MB 2.1, troponin less than 0.05.  Chest x-ray shows bibasilar atelectasis.  EKG shows sinus bradycardia with anterior septal infarct, age undetermined, nonspecific IVCD with nonspecific ST  changes.  Telemetry shows junctional bradycardia.  ASSESSMENT: 1. Intermittent junctional bradycardia on Lopressor and amiodarone, in     acute renal failure.  Bradycardia is most likely secondary to     decreased clearance of beta-blocker from renal failure. 2. Paroxysmal atrial fibrillation, on amiodarone and beta-blocker. 3. Acute renal failure of unclear etiology possibly due to dehydration     from diuretics. 4. Anemia. 5. Mild aortic stenosis. 6. Hypertension. 7. PVD.  PLAN:  Discontinue Lopressor.  We will hold amiodarone for now.  We will cycle cardiac enzymes.  She did receive glucagon in the emergency room. We will plan on IV fluid hydration for now and also consideration of blood transfusion given her significant anemia, which will probably worsen as we hydrate her.  Further workup per hospitalist service who will be the primary admitting doctor.     Armanda Magic, M.D.     TT/MEDQ  D:  11/04/2010  T:  11/05/2010  Job:  [id number]  cc:   Rollene Rotunda, MD, [facility]  Electronically Signed by Armanda Magic M.D. on 11/05/2010 11:15:43 AM

## 2010-11-06 ENCOUNTER — Inpatient Hospital Stay (HOSPITAL_COMMUNITY): Payer: Medicare Other

## 2010-11-06 LAB — BASIC METABOLIC PANEL
Calcium: 8.3 mg/dL — ABNORMAL LOW (ref 8.4–10.5)
GFR calc Af Amer: 27 mL/min — ABNORMAL LOW (ref >60)
GFR calc non Af Amer: 22 mL/min — ABNORMAL LOW (ref >60)
Sodium: 139 mEq/L (ref 135–145)

## 2010-11-06 LAB — GLUCOSE, CAPILLARY
Glucose-Capillary: 80 mg/dL (ref 70–99)
Glucose-Capillary: 158 mg/dL — ABNORMAL HIGH (ref 70–99)

## 2010-11-06 LAB — CBC
MCHC: 31.2 g/dL (ref 30.0–36.0)
Platelets: 365 10*3/uL (ref 150–400)
RDW: 18.1 % — ABNORMAL HIGH (ref 11.5–15.5)

## 2010-11-06 LAB — URINE CULTURE
Colony Count: >=100000
Culture  Setup Time: 201203172058

## 2010-11-06 LAB — PHOSPHORUS: Phosphorus: 2.9 mg/dL (ref 2.3–4.6)

## 2010-11-07 ENCOUNTER — Ambulatory Visit: Payer: Self-pay | Admitting: Endocrinology

## 2010-11-07 ENCOUNTER — Inpatient Hospital Stay (HOSPITAL_COMMUNITY): Payer: Medicare Other

## 2010-11-07 DIAGNOSIS — I499 Cardiac arrhythmia, unspecified: Secondary | ICD-10-CM

## 2010-11-07 LAB — POCT I-STAT 3, ART BLOOD GAS (G3+)
Acid-Base Excess: 5 mmol/L — ABNORMAL HIGH (ref 0.0–2.0)
O2 Saturation: 96 %
Patient temperature: 36.7
TCO2: 29 mmol/L (ref 0–100)
pH, Arterial: 7.549 — ABNORMAL HIGH (ref 7.350–7.400)

## 2010-11-07 LAB — BASIC METABOLIC PANEL
BUN: 43 mg/dL — ABNORMAL HIGH (ref 6–23)
Calcium: 8.7 mg/dL (ref 8.4–10.5)
Creatinine, Ser: 1.14 mg/dL (ref 0.4–1.2)
GFR calc non Af Amer: 46 mL/min — ABNORMAL LOW (ref >60)
Glucose, Bld: 212 mg/dL — ABNORMAL HIGH (ref 70–99)

## 2010-11-07 LAB — GLUCOSE, CAPILLARY: Glucose-Capillary: 182 mg/dL — ABNORMAL HIGH (ref 70–99)

## 2010-11-07 LAB — CBC
MCH: 29.8 pg (ref 26.0–34.0)
MCHC: 31.6 g/dL (ref 30.0–36.0)
MCV: 94.4 fL (ref 78.0–100.0)
Platelets: 380 10*3/uL (ref 150–400)
RDW: 18.9 % — ABNORMAL HIGH (ref 11.5–15.5)
WBC: 5.9 10*3/uL (ref 4.0–10.5)

## 2010-11-07 LAB — MAGNESIUM: Magnesium: 2.4 mg/dL (ref 1.5–2.5)

## 2010-11-08 ENCOUNTER — Inpatient Hospital Stay (HOSPITAL_COMMUNITY): Payer: Medicare Other

## 2010-11-08 LAB — DIFFERENTIAL
Basophils Absolute: 0 10*3/uL (ref 0.0–0.1)
Eosinophils Absolute: 0.3 10*3/uL (ref 0.0–0.7)
Eosinophils Relative: 5 % (ref 0–5)
Lymphs Abs: 2 10*3/uL (ref 0.7–4.0)
Neutrophils Relative %: 49 % (ref 43–77)

## 2010-11-08 LAB — GLUCOSE, CAPILLARY
Glucose-Capillary: 241 mg/dL — ABNORMAL HIGH (ref 70–99)
Glucose-Capillary: 179 mg/dL — ABNORMAL HIGH (ref 70–99)
Glucose-Capillary: 123 mg/dL — ABNORMAL HIGH (ref 70–99)

## 2010-11-08 LAB — POCT I-STAT 3, ART BLOOD GAS (G3+)
Acid-Base Excess: 11 mmol/L — ABNORMAL HIGH (ref 0.0–2.0)
Bicarbonate: 34.8 mEq/L — ABNORMAL HIGH (ref 20.0–24.0)
Patient temperature: 36.7
TCO2: 36 mmol/L (ref 0–100)
pH, Arterial: 7.515 — ABNORMAL HIGH (ref 7.350–7.400)

## 2010-11-08 LAB — POCT I-STAT, CHEM 8
HCT: 34 % — ABNORMAL LOW (ref 36.0–46.0)
Hemoglobin: 11.6 g/dL — ABNORMAL LOW (ref 12.0–15.0)
Potassium: 4.2 mEq/L (ref 3.5–5.1)
Sodium: 140 mEq/L (ref 135–145)
TCO2: 34 mmol/L (ref 0–100)

## 2010-11-08 LAB — BASIC METABOLIC PANEL
Calcium: 8.7 mg/dL (ref 8.4–10.5)
Creatinine, Ser: 0.91 mg/dL (ref 0.4–1.2)
GFR calc Af Amer: >60 mL/min (ref >60)
GFR calc non Af Amer: 59 mL/min — ABNORMAL LOW (ref >60)
Sodium: 151 mEq/L — ABNORMAL HIGH (ref 135–145)

## 2010-11-08 LAB — CBC
HCT: 33.5 % — ABNORMAL LOW (ref 36.0–46.0)
MCV: 90.8 fL (ref 78.0–100.0)
Platelets: 152 10*3/uL (ref 150–400)
RDW: 15.9 % — ABNORMAL HIGH (ref 11.5–15.5)
WBC: 6.1 10*3/uL (ref 4.0–10.5)

## 2010-11-08 LAB — PHOSPHORUS: Phosphorus: 2.1 mg/dL — ABNORMAL LOW (ref 2.3–4.6)

## 2010-11-09 ENCOUNTER — Inpatient Hospital Stay (HOSPITAL_COMMUNITY): Payer: Medicare Other

## 2010-11-09 LAB — GLUCOSE, CAPILLARY
Glucose-Capillary: 108 mg/dL — ABNORMAL HIGH (ref 70–99)
Glucose-Capillary: 152 mg/dL — ABNORMAL HIGH (ref 70–99)

## 2010-11-09 LAB — CBC
MCHC: 30 g/dL (ref 30.0–36.0)
Platelets: 291 10*3/uL (ref 150–400)
RDW: 18.3 % — ABNORMAL HIGH (ref 11.5–15.5)
WBC: 5.2 10*3/uL (ref 4.0–10.5)

## 2010-11-09 LAB — BASIC METABOLIC PANEL
Calcium: 8.5 mg/dL (ref 8.4–10.5)
GFR calc non Af Amer: 59 mL/min — ABNORMAL LOW (ref >60)
Glucose, Bld: 97 mg/dL (ref 70–99)
Potassium: 3.8 mEq/L (ref 3.5–5.1)
Sodium: 144 mEq/L (ref 135–145)

## 2010-11-09 LAB — MAGNESIUM: Magnesium: 2 mg/dL (ref 1.5–2.5)

## 2010-11-10 ENCOUNTER — Inpatient Hospital Stay (HOSPITAL_COMMUNITY): Payer: Medicare Other

## 2010-11-10 LAB — BLOOD GAS, ARTERIAL
Acid-Base Excess: 4.5 mmol/L — ABNORMAL HIGH (ref 0.0–2.0)
Drawn by: 330991
FIO2: 0.21 %
O2 Saturation: 86.2 %
Patient temperature: 98.6

## 2010-11-10 LAB — BASIC METABOLIC PANEL
Calcium: 9.1 mg/dL (ref 8.4–10.5)
Creatinine, Ser: 0.93 mg/dL (ref 0.4–1.2)
GFR calc Af Amer: >60 mL/min (ref >60)
Sodium: 138 mEq/L (ref 135–145)

## 2010-11-10 LAB — GLUCOSE, CAPILLARY: Glucose-Capillary: 136 mg/dL — ABNORMAL HIGH (ref 70–99)

## 2010-11-10 LAB — CBC
Hemoglobin: 8.2 g/dL — ABNORMAL LOW (ref 12.0–15.0)
MCH: 28.6 pg (ref 26.0–34.0)
MCHC: 29.9 g/dL — ABNORMAL LOW (ref 30.0–36.0)
Platelets: 261 10*3/uL (ref 150–400)
RBC: 2.87 MIL/uL — ABNORMAL LOW (ref 3.87–5.11)

## 2010-11-10 LAB — PHOSPHORUS: Phosphorus: 3.1 mg/dL (ref 2.3–4.6)

## 2010-11-10 LAB — MAGNESIUM: Magnesium: 2.1 mg/dL (ref 1.5–2.5)

## 2010-11-11 LAB — GLUCOSE, CAPILLARY
Glucose-Capillary: 138 mg/dL — ABNORMAL HIGH (ref 70–99)
Glucose-Capillary: 144 mg/dL — ABNORMAL HIGH (ref 70–99)

## 2010-11-11 LAB — BASIC METABOLIC PANEL
Calcium: 9.3 mg/dL (ref 8.4–10.5)
GFR calc Af Amer: >60 mL/min (ref >60)
GFR calc non Af Amer: >60 mL/min (ref >60)
Potassium: 3.8 mEq/L (ref 3.5–5.1)
Sodium: 136 mEq/L (ref 135–145)

## 2010-11-11 LAB — CULTURE, BLOOD (ROUTINE X 2)
Culture  Setup Time: 201203180216
Culture: NO GROWTH

## 2010-11-12 LAB — GLUCOSE, CAPILLARY
Glucose-Capillary: 161 mg/dL — ABNORMAL HIGH (ref 70–99)
Glucose-Capillary: 163 mg/dL — ABNORMAL HIGH (ref 70–99)
Glucose-Capillary: 154 mg/dL — ABNORMAL HIGH (ref 70–99)

## 2010-11-12 LAB — COMPREHENSIVE METABOLIC PANEL
ALT: 40 U/L — ABNORMAL HIGH (ref 0–35)
AST: 35 U/L (ref 0–37)
Albumin: 2.3 g/dL — ABNORMAL LOW (ref 3.5–5.2)
Calcium: 9.4 mg/dL (ref 8.4–10.5)
GFR calc Af Amer: >60 mL/min (ref >60)
Potassium: 3.7 mEq/L (ref 3.5–5.1)
Sodium: 140 mEq/L (ref 135–145)
Total Protein: 5.7 g/dL — ABNORMAL LOW (ref 6.0–8.3)

## 2010-11-12 LAB — CBC
MCHC: 31 g/dL (ref 30.0–36.0)
Platelets: 240 10*3/uL (ref 150–400)
RDW: 18.4 % — ABNORMAL HIGH (ref 11.5–15.5)
WBC: 4.4 10*3/uL (ref 4.0–10.5)

## 2010-11-13 LAB — BASIC METABOLIC PANEL
Calcium: 9.7 mg/dL (ref 8.4–10.5)
Chloride: 107 mEq/L (ref 96–112)
Creatinine, Ser: 0.89 mg/dL (ref 0.4–1.2)
GFR calc Af Amer: >60 mL/min (ref >60)
Sodium: 142 mEq/L (ref 135–145)

## 2010-11-13 LAB — HEPATIC FUNCTION PANEL
Alkaline Phosphatase: 130 U/L — ABNORMAL HIGH (ref 39–117)
Total Protein: 6.2 g/dL (ref 6.0–8.3)

## 2010-11-13 LAB — GLUCOSE, CAPILLARY
Glucose-Capillary: 154 mg/dL — ABNORMAL HIGH (ref 70–99)
Glucose-Capillary: 165 mg/dL — ABNORMAL HIGH (ref 70–99)

## 2010-11-17 NOTE — Discharge Summary (Signed)
NAME:  Emma Yu, Emma Yu            ACCOUNT NO.:  [id number]  MEDICAL RECORD NO.:  [id number]           PATIENT TYPE:  I  LOCATION:  2027                         FACILITY:  [facility]  PHYSICIAN:  Conley Canal, MD      DATE OF BIRTH:  01/03/1927  DATE OF ADMISSION:  11/04/2010 DATE OF DISCHARGE:  11/13/2010                              DISCHARGE SUMMARY   PRIMARY CARE PHYSICIAN:  Vikki Ports A. Felicity Coyer, MD  CARDIOLOGIST:  Rollene Rotunda, MD, [facility]  CONSULTING PHYSICIANS:  Armanda Magic, M.D.  DISCHARGE DIAGNOSES: 1. Junctional bradycardia. 2. Hypotension, possibly secondary to junctional bradycardia. 3. Urinary tract infection. 4. Paroxysmal atrial fibrillation status post cardioversion. 5. Status post total right hip replacement with revision beginning of     March this year. 6. Hyperlipidemia. 7. Acute renal failure possibly secondary to diuretics and resulting     dehydration. 8. Transaminitis most likely medications related and also element of     ischemic liver. 9. Peripheral vascular disease. 10.Mild aortic stenosis. 11.Essential hypertension. 12.Diabetes mellitus type 2.  DISCHARGE MEDICATIONS: 1. Clonidine 0.2 mg three times daily. 2. Lasix 40 mg daily. 3. Amlodipine 5 mg at bedtime. 4. Aspirin 162 mg at bedtime. 5. Cinnamon bark 500 mg four times daily. 6. CoQ10 300 mg daily. 7. Colace 100 mg twice daily. 8. Ferrous sulfate 325 mg three times daily. 9. Fish oil two capsules twice daily. 10.Neurontin 300 mg at bedtime. 11.Insulin pump. 12.Sliding scale insulin as prescribed. 13.Losartan 100 mg daily. 14.Methocarbamol three times daily as needed. 15.Lopressor 25 mg twice daily. 16.MiraLax 17 g daily as needed. 17.Norco 5/325 mg every 4 hours as needed. 18.Prilosec 40 mg daily. 19.Tramadol 50 mg every 6 hours as needed. 20.Vitamin supplements.  PROCEDURES PERFORMED: 1. Chest x-rays last one on November 10, 2010 showed bibasilar     atelectasis and small right  pleural effusion. 2. 2-D echocardiogram on November 05, 2010 showed EF 55% to 60%, normal     wall motion, mild aortic stenosis.  HOSPITAL COURSE:  Emma Yu is an 77 year old female with multiple comorbidities as noted above who came into the hospital with mild altered mental status, bradycardia, and renal failure.  She had been recently discharged to [facility] for rehab after revision of right hip replacement.  At admission, the patient was noted to be hypotensive with junctional bradycardia.  She had been on beta- blocker and amiodarone for atrial fibrillation.  The patient was seen by Cardiology who felt that she had poor clearance of the antiarrhythmic medications because of acute renal failure.  At admission, her BUN was 92, creatinine 3.18.  She was probably over diuresed.  She required critical care support, but she did well and was eventually transferred to the hospitalist service on March 25.  In the intensive care unit, the patient was found to have urinary tract infection with cultures growing lactobacillus species for which she was initially treated with ceftriaxone, but later switched to Zosyn.  The patient was rehydrated and taken off diuretics as well as amiodarone in the intensive care unit.  She has done very well and at present she has completed treatment  for the UTI and her labs are close to baseline with CBC showing WBC 4.4, hemoglobin 9.1, hematocrit 29.4, platelet count 240.  Sodium 140, potassium 3.7, BUN 8, creatinine 0.96, total bilirubin 0.5, alkaline phosphatase 123, AST 35, ALT 40, calcium 9.4.  Blood cultures were negative x2.  The patient is clinically stable.  Her medications have been adjusted as on the reconciliation sheet, but of note the patient was taken off amiodarone by Cardiology.  She has been in sinus rhythm. Diuretics were reduced to Lasix 40 mg daily.  The plan is for the patient to be discharged to skilled nursing  facility.  She would ideally want to be in [place] where it will be easier for her son to visit, but if she cannot be discharged to [facility] would be okay with [facility]. Otherwise, she is discharged in stable condition to continue physical therapy as prescribed by Orthopedics.  The time spent for this discharge preparation is less than 30 minutes.     Conley Canal, MD     SR/MEDQ  D:  11/13/2010  T:  11/13/2010  Job:  [id number]  cc:   Vikki Ports A. Felicity Coyer, MD Rollene Rotunda, MD, [facility] Madlyn Frankel. Charlann Boxer, M.D.  Electronically Signed by Conley Canal  on 11/17/2010 02:09:46 AM

## 2010-11-28 ENCOUNTER — Encounter: Payer: Self-pay | Admitting: Cardiovascular Disease

## 2010-11-30 ENCOUNTER — Encounter: Payer: Self-pay | Admitting: Cardiovascular Disease

## 2010-11-30 ENCOUNTER — Ambulatory Visit (INDEPENDENT_AMBULATORY_CARE_PROVIDER_SITE_OTHER): Payer: Medicare Other | Admitting: Cardiovascular Disease

## 2010-11-30 DIAGNOSIS — IMO0002 Reserved for concepts with insufficient information to code with codable children: Secondary | ICD-10-CM

## 2010-11-30 DIAGNOSIS — R9439 Abnormal result of other cardiovascular function study: Secondary | ICD-10-CM | POA: Insufficient documentation

## 2010-11-30 DIAGNOSIS — I10 Essential (primary) hypertension: Secondary | ICD-10-CM

## 2010-11-30 DIAGNOSIS — I4891 Unspecified atrial fibrillation: Secondary | ICD-10-CM

## 2010-11-30 NOTE — Assessment & Plan Note (Signed)
This was post operative. [name] now in sinus rhythm. Will continue Metoprolol without Amiodarone.  Given that her A-fib was brief, will not start anticoagulation. Continue Aspirin daily.  If she needs to have another surgery, will likely start Amiodarone before the surgery.

## 2010-11-30 NOTE — Assessment & Plan Note (Signed)
BP is elevated but she is on multiple medications at this time. Some of this might be due to discomfort. Will monitor.

## 2010-11-30 NOTE — Progress Notes (Signed)
HPI  Emma Yu is an 77 y/o female who is following up after recent complications related to her hip surgery. Preoperatively, she was known to have mild aortic stenosis. She underwent a nuclear stress test which showed mild apical reversibility which was felt to be shifting attenuation vs. Mild apical ischemia. She was deemed to be moderate risk for the surgery. Post operative course was complicated by atrial fibrillation with rapid ventricular response with significant lateral ST depression and hypotension. She did not respond to Cardizem and Amiodarone. Thus, she was urgently cardioverted to sinus rhythm. Amiodarone was continued. She was also found to have fluid overload and treated with Lasix. She was discharged to rehab but returned few days later with presyncope and was found to have junctional bradycardia and acute renal failure likely from over diuresis. Amiodarone was stopped. She was intubated for few days. Renal function improved to baseline with hydration.  She feels better now. No chest pain but continues to have dyspnea. She walks on a walker and can't put more than 25% weight bearing on right leg. She might need to have another surgery.   Allergies  Allergen Reactions  . Carvedilol   . Diltiazem Hcl   . Fenofibrate   . Hctz (Hydrochlorothiazide)   . Nisoldipine   . Sular   . Tekturna (Aliskiren Fumarate)   . Valturna (Aliskiren-Valsartan)      Current Outpatient Prescriptions on File Prior to Visit  Medication Sig Dispense Refill  . amLODipine (NORVASC) 10 MG tablet Take 5 mg by mouth at bedtime.       Marland Kitchen aspirin 81 MG tablet Take 2 at bedtime       . calcium-vitamin D (OSCAL WITH D 500-200) 500-200 MG-UNIT per tablet Take 1 tablet by mouth 2 (two) times daily.        . Cinnamon 500 MG capsule 4 capsules daily       . Coenzyme Q10 10 MG capsule (300mg  caps) 1 capsule by mouth daily       . furosemide (LASIX) 40 MG tablet Take 40 mg by mouth daily. 2 tablets by mouth every  morning and 1 by mouth at 2pm      . gabapentin (NEURONTIN) 300 MG capsule 1 capsule by mouth every night as needed       . losartan (COZAAR) 100 MG tablet Take 100 mg by mouth daily.        . methocarbamol (ROBAXIN) 500 MG tablet Take 500 mg by mouth 3 (three) times daily as needed. 1 tablet tid as needed for cramps       . Multiple Vitamins-Calcium (ONE-A-DAY WOMENS FORMULA) TABS Take 1 tablet by mouth daily.        . Omega-3 Fatty Acids (FISH OIL) 1200 MG CAPS Take 1,000 mg by mouth 2 (two) times daily.       Marland Kitchen omeprazole (PRILOSEC) 40 MG capsule Take 40 mg by mouth daily.        . traMADol (ULTRAM) 50 MG tablet Take 50 mg by mouth every 6 (six) hours as needed.        Marland Kitchen acetaminophen (TYLENOL) 325 MG tablet As needed       . Ascorbic Acid (VITAMIN C) 500 MG tablet Take 500 mg by mouth daily.        . Cholecalciferol (VITAMIN D3) 2000 UNITS capsule Take 2,000 Units by mouth daily.        . cilostazol (PLETAL) 50 MG tablet Take 50 mg by mouth 2 (two)  times daily.        . cloNIDine (CATAPRES) 0.2 MG tablet Take 0.2 mg by mouth 4 (four) times daily.        . hydrALAZINE (APRESOLINE) 25 MG tablet Take 25 mg by mouth 2 (two) times daily.        . insulin lispro (HUMALOG) 100 UNIT/ML injection Sliding scale       . naproxen sodium (ALEVE) 220 MG tablet as needed.        . nystatin (MYCOSTATIN) powder Apply topically 4 (four) times daily. As needed       . pravastatin (PRAVACHOL) 10 MG tablet Take 10 mg by mouth at bedtime.           Past Medical History  Diagnosis Date  . PAD (peripheral artery disease)   . Aortic stenosis     Mild  . Diabetes mellitus   . Hypertension   . Hyperlipidemia   . Venous insufficiency   . Heart murmur   . CHF (congestive heart failure)     normal EF  . Atrial fib/flutter, transient      Past Surgical History  Procedure Date  . Total hip arthroplasty 1991, 1994    redo in 1994  . Lumbar disc surgery 1999     Family History  Problem Relation Age  of Onset  . Diabetes Father   . Diabetes Other     5/8 sibs     History   Social History  . Marital Status: Widowed    Spouse Name: N/A    Number of Children: N/A  . Years of Education: N/A   Occupational History  .      retired   Social History Main Topics  . Smoking status: Former Smoker -- 1.5 packs/day for 25 years    Types: Cigarettes    Quit date: 08/20/1985  . Smokeless tobacco: Not on file  . Alcohol Use:   . Drug Use: No  . Sexually Active:    Other Topics Concern  . Not on file   Social History Narrative   Lives with sonWidowed 1991Quit smoking in 1987       PHYSICAL EXAM   BP 187/74  Pulse 75  Wt 147 lb (66.679 kg)  SpO2 94%   Constitutional: [name] oriented to person, place, and time. She appears well-developed and well-nourished. No distress.  HENT: No nasal discharge.  Head: Normocephalic and atraumatic.  Eyes: Pupils are equal, round, and reactive to light. Right eye exhibits no discharge. Left eye exhibits no discharge.  Neck: Normal range of motion. Neck supple. No JVD present. No thyromegaly present.  Cardiovascular: Normal rate, regular rhythm, normal heart sounds. Exam reveals no gallop and no friction rub.  There is a 2/6 SEM at aortic area.  Pulmonary/Chest: Effort normal and breath sounds normal. No stridor. No respiratory distress. She has no wheezes. She has no rales. She exhibits no tenderness.  Abdominal: Soft. Bowel sounds are normal. She exhibits no distension. There is no tenderness. There is no rebound and no guarding.  Musculoskeletal: Normal range of motion. She exhibits no edema and no tenderness.  Neurological: [name] alert and oriented to person, place, and time. Coordination normal.  Skin: Skin is warm and dry. No rash noted. [name] not diaphoretic. No erythema. No pallor.  Psychiatric: She has a normal mood and affect. Her behavior is normal. Judgment and thought content normal.      ASSESSMENT AND PLAN

## 2010-11-30 NOTE — Assessment & Plan Note (Signed)
Although her stress test was only mildly abnormal, it is concerning that she had post operative atrial fibrillation with significant ST depression and hypotension. She will need to have coronary angiography done which she recovers from current surgery. Will reevaluate in 1 month.

## 2010-12-08 NOTE — Discharge Summary (Signed)
  NAME:  Emma Yu, Emma Yu            ACCOUNT NO.:  [id number]  MEDICAL RECORD NO.:  [id number]           PATIENT TYPE:  I  LOCATION:  1437                         FACILITY:  [facility]  PHYSICIAN:  Alexzandrew L. Perkins, P.A.C.DATE OF BIRTH:  [birth year]  DATE OF ADMISSION:  10/23/2010 DATE OF DISCHARGE:  10/31/2010                        DISCHARGE SUMMARY - REFERRING   ADDENDUM:  ADMITTING DIAGNOSES:  Please see previous discharge summary.  DISCHARGE DIAGNOSES:  Please see previous discharge summary.  HOSPITAL COURSE:  The patient was originally set up to possibly go on Monday, October 30, 2010, however, bed had not been located by Child psychotherapist.  She wants to look into a couple of different facilities.  We are looking into these facilities today after the bed becomes available. If becomes available, we are going to allow her to be transferred out today on October 31, 2010.  DISCHARGE PLAN:  Discharge plan and medications as per the original discharge summary in the first addendum discharge summary.  FOLLOWUP:  [name] to follow up Dr. Pamella Pert in 3 weeks and she also needs to follow up with Dr. Durene Romans the week of March 19th.  DISPOSITION:  Again pending at the time of this dictation, we were awaiting on final bed offers at the skilled facility.  CONDITION ON DISCHARGE:  Slowly improving.     Alexzandrew L. Perkins, P.A.C.     ALP/MEDQ  D:  10/31/2010  T:  10/31/2010  Job:  [id number]  Electronically Signed by Patrica Duel P.A.C. on 10/31/2010 10:26:58 AM Electronically Signed by Durene Romans M.D. on 12/08/2010 03:38:53 PM

## 2010-12-19 DIAGNOSIS — I219 Acute myocardial infarction, unspecified: Secondary | ICD-10-CM

## 2010-12-19 HISTORY — DX: Acute myocardial infarction, unspecified: I21.9

## 2011-01-02 NOTE — Assessment & Plan Note (Signed)
[facility]                        [place] [facility]ARDIOLOGY OFFICE NOTE   NAME:[name], [name]                   MRN:          [id number]  DATE:09/19/2009                            DOB:          [birth year]    PROBLEM LIST:  1. Peripheral arterial disease.  2. Hypertension.  3. Diabetes.  4. Hyperlipidemia.   INTERVAL HISTORY:  The patient has just completed 1-week course of  Levaquin and clindamycin for a left lower extremity swelling, warmth,  and erythema.  The symptoms have improved only mildly.  In the past,  increasing Lasix dose along with the antibiotics seem to help the  problem.  The patient complains of continued discomfort in the left  lower extremity.   PHYSICAL EXAMINATION:  VITAL SIGNS:  Her blood pressure is 136/62, pulse  is 79.  [name] sating 95% on room air and she weighs 166 pounds.  GENERAL:  No acute distress.  EXTREMITIES:  Left lower extremity has 1+ edema and her skin is tight.  There is no weeping.  There is erythema.  [name] 2+ dorsalis pedis  pulse.  Her left knee is not erythematous, but is mildly warm compared  to the right knee.   Review of labs, since the last week, her blood cultures were negative.  Her CBC was within normal limits with a normal white count and her BMP  was within normal limits except a glucose of 150, BUN of 36, and  creatinine of 1.3.   ASSESSMENT AND PLAN:  The differential diagnosis for this issue includes  cellulitis, possible cholesterol embolism, CHF (less likely).  She has  had 2 negative ultrasounds of the left lower extremity that ruled out  DVT.  Today, we will try to increase the Lasix for the next 5 days to  see if this improves the edema.  She should take an additional dose at  2:00 p.m. daily.  If at the end of the week, the symptoms have not  improved.  Her hypertension is well-controlled today and she can  continue on medications as listed in the chart.     Brayton El,  MD  Electronically Signed    SGA/MedQ  DD: 09/19/2009  DT: 09/20/2009  Job #: [id number]

## 2011-01-02 NOTE — Assessment & Plan Note (Signed)
[facility]                        [place] CARDIOLOGY OFFICE NOTE   NAME:[name], [name]                   MRN:          [id number]  DATE:10/10/2009                            DOB:          May 31, 1927    PROBLEM LIST:  1. PAD.  2. Chronic left lower extremity edema.  3. Hypertension.  4. Diabetes.  5. Hyperlipidemia.   INTERVAL HISTORY:  After the patient's last office visit she reported to  the emergency room for worsening pain and swelling in left lower  extremity.  They placed her on antibiotics and gave her an additional  dose of IV Lasix.  Over the past 5 days she states that the discomfort  in her left lower extremity has improved somewhat but has not totally  resolved.  She continues to complain of edema in that leg.  The patient  has had two ultrasounds that have been negative for DVT.  Her white  count is completely within normal limits and she has been afebrile.  She  has undergone multiple courses of antibiotics including Levaquin,  clindamycin and doxycycline without persistent improvement in her  symptoms.   PHYSICAL EXAM:  Today blood pressure is 148/63, pulse is 80.  She weighs  164 pounds.  [name] satting 95% on room air.  Her left lower extremity  has 1+ lower extremity edema with evidence of stasis dermatitis.  Her  dorsalis pedis pulses are 2+.  There is some mild erythema and mild  blistering on the left lower extremity.  However, it is not weeping.   Review of the patient's lab work from her emergency room visit dated  February 16:  Her white count 6.1 with a normal differential.  Her  hemoglobin is 11.5, hematocrit 33.5, platelet count 152.  Her BMP was  within normal limits including a potassium of 4.2, BUN 38, creatinine of  1.1.   ASSESSMENT/PLAN:  This left lower extremity edema is chronic in nature.  I do not believe she has cellulitis and she does not have a deep venous  thrombosis.  I have recommended  increasing her Lasix a bit more.  Initially she was taking 80 mg daily which we increased to 80 mg in the  morning and 40 mg in the afternoon.  I recommend she try 80 mg twice a  day for the next several days to see if this assists.  I have also  recommended TED hose which she states she has difficulty placing on  because of hip pain.  I will make a referral to dermatology for  evaluation and possible wrapping of her left lower extremity.     Brayton El, MD  Electronically Signed    SGA/MedQ  DD: 10/10/2009  DT: 10/10/2009  Job #: [id number]

## 2011-01-02 NOTE — Assessment & Plan Note (Signed)
[facility]                        [place] CARDIOLOGY OFFICE NOTE   NAME:Emma [name], Emma [name]                   MRN:          [id number]  DATE:06/09/2009                            DOB:          March 06, 1927    CHIEF COMPLAINT:  Reestablish cardiovascular care.   HISTORY OF PRESENT ILLNESS:  The patient is an 77 year old white female  past medical history significant for resistant hypertension, peripheral  arterial disease, hyperlipidemia who is presenting today in order to  change cardiovascular care.  The patient states she has been struggling  with long-term history of difficulty in controlling her blood pressure.  She has a history of peripheral arterial disease and had stents placed  in her  right and left common iliac arteries in 2002.  In September of  this year, the patient underwent abdominal aortography, selective  bilateral renal arteriography and bilateral lower extremity runoff.  She  was found to have moderate angiographic left renal disease without an  associated pullback gradient.  She was also found to have diffuse distal  abdominal aortic disease of moderate severity without a significant  pullback gradient.  Lastly, she was found to have severe proximal and  right superficial femoral artery disease.  The patient states that she  has a history of low back pain and has had surgery on her lower back  several years ago.  She continues to have right-sided lower back pain  and occasionally pain in her upper gluteal area bilaterally.  These  areas are not consistent with areas of obstruction in her peripheral  arterial system.  She denies any chest discomfort or significant  shortness of breath, however, she states that her lower back pain has  limited her activities to the point where she cannot perform the level  of activity above mild.  She states compliance with her medications.  She denies any syncopal episodes or any episodes of  significant chest  discomfort or palpitations.  She does state that the back pain that she  experiences occurs only with walking, and it is promptly relieved with  rest.   PAST MEDICAL HISTORY:  As above in HPI.  In addition, the patient has a  history of chronic lower back pain and surgery and chronic lower back  pain and had surgery on her back in the past.  Diabetes and vitamin D  deficiency.   SOCIAL HISTORY:  No tobacco, no alcohol.  The patient lives at home and  is able to care of herself.   FAMILY HISTORY:  Noncontributory.   ALLERGIES:  TEKTURNA, DILTIAZEM, XOLAIR, MINOXIDIL, FENOFIBRATE,  CAPTOPRIL, HYDROCHLOROTHIAZIDE, CARVEDILOL, VALTURNA.   CURRENT MEDICATIONS:  1. Clonidine 0.2 mg q.i.d.  2. Hydralazine 10 mg q.i.d.  3. Diovan 320 mg at bedtime.  4. Amlodipine 10 mg at bedtime.  5. Furosemide 40 mg daily.  6. Pravastatin 10 mg nightly.  7. Omeprazole 40 mg daily.  8. Humalog insulin.  9. Ranitidine 300 mg nightly.  10.Calcium, vitamin D supplementation.  11.Aspirin 81 mg daily.  12.Fish oil 2400 mg daily.  13.Cinnamon and vitamin D.  14.Coenzyme Q10.  15.Vitamin C.  16.Potassium gluconate.  REVIEW OF SYSTEMS:  As in HPI.  The patient also endorses fatigue,  generalized occasional aches and pains.  Other systems as in HPI,  otherwise negative.   PHYSICAL EXAM:  VITAL SIGNS:  Blood pressure today is 156/65 with a  pulse of 79.  She weighs 165 pounds and sating 96% on room air.  GENERAL:  [name] in no acute distress.  HEENT:  Nonfocal.  NECK:  Supple.  There is no JVD.  HEART:  Regular rate and rhythm.  Distant heart sounds.  LUNGS:  Clear bilaterally.  ABDOMEN:  Soft, nontender, nondistended.  EXTREMITIES:  Trace bilateral lower extremity edema.  Pulses, the  patient has a 2+ bilateral radial pulses, 2+ bilateral dorsalis pedis  pulses.  Her left femoral pulse is 2+ and her right femoral pulse is  trace.  SKIN:  Warm and dry.  PSYCHIATRIC:  The  patient is appropriate with normal levels of insight.  NEURO: Nonfocal.   IMAGING STUDIES:  EKG independently interpreted by myself demonstrates  an atrial rhythm which may be ectopic in origin.  She has a left axis  deviation and left ventricular hypertrophy.  She also has poor R-wave  progression and possibly an old anterior septal MI.   LABORATORY DATA:  Review of her labs from September 7 show sodium 135,  potassium 4.9, chloride 105, CO2 29, BUN 44, creatinine 1.2, glucose  156.  Her LFTs were completely within normal limits.  White count of 7,  hemoglobin 13, hematocrit 39, platelet count 176.  I reviewed the  patient's last angiographic study as above in the HPI.   ASSESSMENT/PLAN:  This is an 76 year old white female with peripheral  arterial disease and resistant hypertension.  [name] currently not  experiencing any symptoms consistent with active coronary ischemia or  congestive heart failure, or arrhythmia.  The patient is having lower  back pain and some upper right gluteal pain but this does not correspond  to the significant disease in her right superficial femoral artery.  She  is not currently having any calf or thigh claudication.  She recently  underwent ABIs at an outside hospital.  It will be helpful to obtain the  results of this study.  We would also like to check her renin and  aldosterone levels which may provide some assistance in adjusting her  antihypertensive medications.  Unfortunately, the patient has multiple  medication intolerances and it is unclear what exactly the intolerance  to each  medication was.  For now, we will not make any changes to her medical  regimen as her blood  pressure is only minimally elevated, and we would like to gather  additional information first.  Now we will see the patient back in two  months' time.     Brayton El, MD  Electronically Signed    SGA/MedQ  DD: 06/10/2009  DT: 06/11/2009  Job #: [id number]

## 2011-01-02 NOTE — Assessment & Plan Note (Signed)
[facility] CARDIOLOGY OFFICE NOTE   NAME:Emma [name], Emma [name]                   MRN:          [id number]  DATE:05/16/2010                            DOB:          1927/04/01    Emma [name] is an 77 year old female who is here today for a followup  visit.  She has the following problems list:  1. Peripheral arterial disease status post bilateral common iliac      stenting with kissing technique done in 2002 by Dr. Beverely Pace in [facility].  Most recent ABI evaluation was in 2010, which was      moderately reduced on the right side at 0.6 and 0.8 on the left      side.  She had most recent angiography was done also in 2010, which      showed patent iliac stents.  There was 90-95% proximal right      superficial femoral artery disease and diffuse 60% disease after      that.  It was treated medically at that time.  2. Mild aortic stenosis.  3. Chronic lower extremity venous insufficiency with edema.  4. Type 2 diabetes.  5. Hypertension.  6. Hyperlipidemia.   INTERVAL HISTORY:  The patient continues to have buttock and thigh  claudications which seems to be happening with minimal activities at  this time including going to the mailbox.  She also has mild discomfort  in the right calf area as well.  The symptoms seems to be worse on the  right side than on the left side.  This has been progressive over the  last year.  Usually, the discomfort disappears with rest.  She does have  chronic lower extremity edema which has improved with furosemide.  She  has been using support stockings with improvement.   MEDICATIONS:  1. Clonidine 0.2 mg 4 times daily.  2. Hydralazine 10 mg twice daily.  3. Diovan 320 mg once daily.  4. Amlodipine 10 mg at bedtime.  5. Furosemide 40 mg 2 tablets in the morning and 1 tablet in the      afternoon.  6. Pravastatin 10 mg at bedtime.  7. Humalog.  8. Omeprazole 40 mg daily.  9.  Os-Cal.  10.Aspirin 81 mg 2 tablets daily.  11.Fish oil 1200 mg 3 capsules daily.  12.Potassium gluconate.  13.Gabapentin 300 mg at bedtime.   ALLERGIES:  The patient has reported intolerance to multiple medications  including CARVEDILOL which causes bradycardia, HYDROCHLOROTHIAZIDE,  DILTIAZEM, and other medications which were reviewed in her chart.   PHYSICAL EXAMINATION:  GENERAL:  The patient appears to be in pleasant,  in no acute distress.  VITAL SIGNS:  Weight is 157.4 pounds, blood pressure is 105/59 in the  right arm and 117/58 in the left arm, pulse is 76, oxygen saturation is  97% on room air.  We checked her blood pressure also with her home blood  pressure machine, but it did not correlate with our readings.  Measurement we got was 152/87.  HEENT:  Normocephalic, atraumatic.  NECK:  No JVD or carotid bruit.  RESPIRATORY:  Normal respiratory effort with no use of accessory  muscles.  Auscultation reveals normal breath sounds.  CARDIOVASCULAR:  Normal PMI.  Normal S1 and S2.  There is a 2/6 systolic  ejection murmur at the aortic area with preserved S2.  ABDOMEN:  Benign, nontender, nondistended with no abdominal bruits.  EXTREMITIES:  With +1 edema bilaterally.  SKIN:  There is evidence of stasis dermatitis in the lower extremities.  VASCULAR:  Radial pulses are normal and equal bilaterally.  Femoral  pulses are slightly diminished bilaterally.  Popliteal artery pulse is  not palpable in the left side, but normal on the right side.  Dorsalis  pedis is not palpable on the right side, but faint on the left side.  MUSCULOSKELETAL:  There is normal muscle strength in the upper and lower  extremities.  PSYCHIATRIC:  [name] alert and oriented x3 with normal mood and affect.   IMPRESSION:  1. Peripheral arterial disease with lifestyle limiting claudications,      especially in the right side.  [name] actually having symptoms that      goes all the way from her back to the  right calf area.  She does      have history of bilateral iliac stenting in 2002.  Her femoral      pulses are close to normal bilaterally.  Most recent angiogram in      2010 showed significant right superficial femoral artery disease      that was treated medically.  Based on her physical exam today,      there appears to be significant obstructive disease on the right      superficial femoral artery level.  Due to her current symptoms, I      will go ahead and order an ABI with an arterial duplex evaluation.      We might have to proceed with an angiography and intervention if      indicated due to severity of her symptoms.  In the meantime, we      will continue with daily aspirin and treating her risk factors.  2. Hypertension:  [name] intolerant to multiple medications.  She did      have moderate left artery stenosis before.  Due to her lower      extremity edema, I would like to cut down amlodipine dose which      will be 5 mg once daily.  We will continue all her other      medications.  3. Mild aortic stenosis:  This appears to be stable by physical exam      and by symptoms.  The patient will follow up after ABI and duplex      ultrasound.     Emma Bears, MD  Electronically Signed    MA/MedQ  DD: 05/16/2010  DT: 05/17/2010  Job #: [id number]

## 2011-01-02 NOTE — Assessment & Plan Note (Signed)
[facility]                        [place] CARDIOLOGY OFFICE NOTE   NAME:[name], [name]                   MRN:          [id number]  DATE:08/03/2009                            DOB:          27-Sep-1926    PROBLEM LIST:  1. Peripheral arterial disease.  2. Hypertension.  3. Diabetes.  4. Hyperlipidemia.  5. Vitamin D deficiency.   INTERVAL HISTORY:  At the patient's last office visit, she presented  with increased swelling and warmth of the left lower extremity.  Since  that time, she has had a negative duplex ultrasound.  We had also placed  her on increased Lasix dose and a course of doxycycline for possible  cellulitis.  The patient also states she saw another physician who had  injected her knee on that side as she was having some arthritic type  pain.  The patient states that her symptoms in that leg have since  resolved.  She does endorse nocturnal pain in the left lower extremity  and sometimes in the right lower extremity that is sometimes described  as a burning sensation.  [name] taking tramadol for this p.r.n. with  some success.  The patient states she is scheduled to see Dr. Everardo All in  [place] regarding any possible tailoring of her diabetes management.  The patient had requested this referral at her last office visit.  The  patient denies any chest discomfort but does endorse some dyspnea on  exertion that is chronic.  She has not had any dizzy episodes, and she  also denies syncope.   PHYSICAL EXAMINATION:  VITAL SIGNS:  Blood pressure is 135/68, pulse is  72, satting 96% on room air.  She weighs 159 pounds which is 3 pounds  less than she weighed approximately 1 month ago.  GENERAL:  [name] in no acute distress.  HEENT:  Normocephalic, atraumatic.  NECK:  Supple.  HEART:  Regular rate and rhythm with a 2/6 systolic murmur heard loudest  at the right upper sternal border that appears to radiate to the  carotids.  LUNGS:   Clear to auscultation.  ABDOMEN:  Soft, nontender, nondistended.  EXTREMITIES:  Without edema.   LABORATORY DATA:  Review of the patient's labs since her last visit  shows aldosterone level of 6.0 and renin level of 55.  Her BMP was  within normal limits including a potassium 4.70, creatinine 1.35, her  HDL was 64, triglycerides 536, LDL 46.   ASSESSMENT/PLAN:  1. Hypertension.  Blood pressure is at goal.  She should continue on      her current medical regimen as listed in the chart.  If additional      titration of medications is needed ideally we would initiate      therapy that would target renin as her renin level is significantly      elevated.  However, the patient has had an intolerance to [facility]      and to CARVEDILOL in the past.  The patient states that the      carvedilol caused some pauses on a cardiac event monitor that were  symptomatic.  2. Hyperlipidemia.  The patient's LDL is at goal and she should      continue the pravastatin and fish oil.  3. Peripheral arterial disease.  The patient does not have any current      symptoms of claudication.  She should continue on aspirin and      statin therapy.  4. Diabetes.  The patient being followed by primary care physician and      is supposed to see Dr. Everardo All for further evaluation and      treatment.  5. The patient has a murmur that has not been appreciated before exam.      Her last echocardiogram was in 2008 that showed a mildly sclerotic      aortic valve.  We will repeat the echo before her next appointment.     Brayton El, MD  Electronically Signed    SGA/MedQ  DD: 08/03/2009  DT: 08/04/2009  Job #: [id number]

## 2011-01-02 NOTE — Assessment & Plan Note (Signed)
[facility]                        [place] CARDIOLOGY OFFICE NOTE   NAME:Emma [name], Emma [name]                   MRN:          [id number]  DATE:07/11/2009                            DOB:          Jul 12, 1927    PROBLEM LIST:  1. Peripheral arterial disease.  2. Hypertension.  3. Diabetes.  4. Hyperlipidemia.  5. Vitamin D deficiency.   INTERVAL HISTORY:  The patient comes into the clinic today with  persistent swelling, erythema, and warmth of the left lower extremity.  She states that this began approximately 1 month ago.  She reported to  [facility] and ended up having a venous duplex study of  left lower extremity which at that time ruled out deep venous  thrombosis.  The patient states since that time the symptoms have  worsened slightly.  Specifically, she is bothered by the tightness  around her calf with associated erythema and warmth.  She denies any  fevers or chills.  No chest pain.  She denies any symptoms in her right  leg.  She does endorse some left knee pain and swelling that has  somewhat worsened.  She has received an orthopedic surgery consult for  this.  Lastly, the patient is requesting referral to a diabetes  specialist and she feels her glucose levels have been running higher  than usual.   PHYSICAL EXAMINATION:  VITAL SIGNS:  Blood pressure is 148/60, pulse is  84, she is saturating 95% on room air, and she weighs 162 pounds.  GENERAL:  No acute distress.  HEENT:  Nonfocal.  HEART:  Regular rate and rhythm.  LUNGS:  Clear bilaterally.  ABDOMEN:  Soft, nontender, and nondistended.  EXTREMITIES:  Left lower extremity has 1+ edema.  Leg has some anterior  erythema and warmness.  She has 1+ posterior tibial and 2+ dorsalis  pedis pulses on the left lower extremity.  Her left knee is somewhat  swollen, but not warm or erythematous.  Her right lower extremity has no  edema, has 1+ dorsalis pedis pulse.   Review  of the patient's most recent medical record indicates that at the  beginning of the month she was placed on Augmentin and azithromycin for  possible community-acquired pneumonia.  The patient states that her leg  symptoms did not change after the antibiotic course.   ASSESSMENT AND PLAN:  The differential diagnosis for the patient's left  lower extremity symptoms include deep venous thrombosis, cellulitis,  volume overload from heart failure, or atheroembolic disease.  Today in  clinic, we have placed the patient on a course of doxycycline to treat  possible methicillin-resistant Staphylococcus aureus cellulitis.  A  methicillin-sensitive staphylococcus aureus cellulitis would have been  covered by the Augmentin and azithromycin and she showed no improvement  after that course of antibiotics.  We will increase the patient's Lasix  for 1-week time in order to aid diuresis as the patient's discoloration  may represent stasis dermatitis.  This would be an atypical presentation  of volume overload as she has no swelling in her right lower extremity.  Lastly, we will repeat a venous duplex study  of the left lower extremity  as the patient's symptoms have worsened since her initial study over 1  month ago.  She should continue her aspirin and statin therapy.  We will  follow up with her once results of these studies are available and see  her back in clinic in several weeks' time.  Lastly, we will make a  referral to an endocrinologist in [place] at her request for  management of her diabetes.     Brayton El, MD  Electronically Signed    SGA/MedQ  DD: 07/11/2009  DT: 07/12/2009  Job #: [id number]

## 2011-01-02 NOTE — Assessment & Plan Note (Signed)
[facility]                        [place] CARDIOLOGY OFFICE NOTE   NAME:Emma [name], Emma [name]                   MRN:          [id number]  DATE:06/01/2010                            DOB:          Nov 15, 1926    Ms. Christians is an 77 year old female who is here today for a followup  visit.  She has the following problem list:  1. Peripheral arterial disease status post bilateral common iliac      stenting with kissing technique done in 2002, by Dr. Beverely Pace.  Most      recent ABI evaluation was, this month October 2011, which was      moderately reduced on the right side at 0.55 and mildly reduced in      the left side at 0.75.  This has been relatively stable from her      prior ABI in 2010.  [name] known to have significant proximal right      superficial femoral artery stenosis which is being treated      medically.  2. Mild aortic stenosis.  3. Chronic lower extremity edema due to venous insufficiency.  4. Type 2 diabetes.  5. Hypertension.  6. Hyperlipidemia.   INTERVAL HISTORY:  The patient is stable since the last visit.  She was  noted to have lower extremity edema during last visit and thus I decided  to cut down on her amlodipine to 5 mg once daily.  The edema has  improved.  She was also complaining of claudication and thus was  evaluated with an ABI.  Most of her discomfort are in the hip and  buttock area with walking.  She does get cramps in the right calf, but  that is less intense than her other symptoms.  These symptoms happen  after walking about 300 feet.  She has been checking her blood pressure  at home and overall, blood pressure has been running between 140-160  systolic.   PHYSICAL EXAMINATION:  VITAL SIGNS:  Weight is 156 pounds, blood  pressure is 132/69, pulse is 72, oxygen saturation is 95% on room air.  NECK:  Reveals no JVD or carotid bruits.  LUNGS:  Clear to auscultation.  HEART:  Regular rate and rhythm with no  gallops.  There is a 2/6  systolic and ejection murmur at the aortic area.  ABDOMEN:  Benign, nontender, nondistended.  EXTREMITIES:  With no edema at this time.   IMPRESSION:  1. Peripheral arterial disease:  Most of her symptoms now seems to be      lower back, hip, and buttock claudication.  Her ABI and ultrasound      evaluation showed patent iliac stents.  She also has preserved      femoral pulses by exam.  Thus, it seems that most of her symptoms      are likely not related to peripheral arterial disease and more to      her known history of back issues as well as neuropathic symptoms.      The proximal right superficial femoral artery stenosis will explain      her right calf claudication  which overall do not seem to be as      severe.  I explained to her that procedure to intervene on the      right superficial femoral artery will only improve her right calf      claudication and not her lower back.  Thus, I think continuing      medical therapy is reasonable at this time.  2. Hypertension with intolerance to multiple medications.  [name]      currently on clonidine 0.2 mg 4 times daily, hydralazine 10 mg      twice daily, losartan 100 mg once daily, amlodipine 5 mg once      daily, furosemide 40 mg 2 tablets in the morning and one in the      afternoon.  I will go ahead and increase hydralazine to 25 mg twice      daily as her home blood pressure readings have been high.  3. Mild aortic stenosis which seems to be stable.  The patient will      follow up in 3 months from now or earlier if needed.     Lorine Bears, MD  Electronically Signed    MA/MedQ  DD: 06/01/2010  DT: 06/02/2010  Job #: [id number]

## 2011-01-02 NOTE — Consult Note (Signed)
NEW PATIENT CONSULTATION   Emma [name], CALAMARI [name]  DOB:  1927/07/17                                       07/25/2010  GNFAO#:[id number]   Emma [name] [name] presents today for evaluation of lower extremity  arterial and venous pathology.  [name] a very pleasant 77 year old white  female with a long history of peripheral vascular occlusive disease.  She underwent bilateral iliac stents by Dr. Karleen Hampshire in [facility] in  2002.  She recalls that she did not have any lower extremity pain at  that time but reportedly has have hypertension and underwent arteriogram  for further evaluation of this.  She does not recall any preprocedural  claudication symptoms.  She does not have any history of tissue loss.  She does have a history of chronic lower extremity swelling but no  documented DVT.  She does have a long history of right hip problems,  having undergone total hip in 1991 and a redo in 1994, also had lumbar  back surgery in 1999 and is being considered for redo right hip  replacement.  She reports when she walks she does have right hip pain  and as she progresses can occasionally have calf.  She also reports that  she has a tingling sensation in her feet bilaterally and has been told  that she has a neuropathy related to her diabetes.  She does not have  any tissue loss.  She does have a long history of insulin-dependent  diabetes, does have a history of hypertension and surgery as mentioned.   SOCIAL HISTORY:  [name] widowed with 1 child.  [name] retired.  She quit  smoking in 1986 and does not drink alcohol.   FAMILY HISTORY:  Positive for premature heart disease and diabetes in  her father, brother and sisters.   REVIEW OF SYSTEMS:  No weight loss or gain.  Her weight is 155 pounds.  [name] 5 feet 5 inches tall.  She does have history of arterial  blockages in her lower extremities.  CARDIAC:  Positive for heart murmur.  GI:  For reflux.  HEMATOLOGIC:  For a  clotting disorder.  MUSCULOSKELETAL:  Arthritis.  Otherwise review of systems is negative except HPI.   PHYSICAL EXAMINATION:  Well-developed, well-nourished white female  appearing younger than stated age of 40.  Blood pressure is 176/62, pulse 74, respirations 18.  Oxygen saturation  is 97% on room air.  [name] in no acute distress.  HEENT:  Normal.  CHEST:  Clear bilaterally without rales, rhonchi or wheezes.  HEART:  Regular rate and rhythm.  I do not appreciate a murmur.  She  does have 2+ radial pulses and 2+ femoral pulses.  She has a 2+ left  dorsalis pedis pulse and a 1 to 2+ right dorsalis pedis pulse.  ABDOMEN:  Soft, nontender.  No masses.  MUSCULOSKELETAL:  Shows no major deformities or cyanosis.  NEUROLOGIC:  No focal weakness or paresthesias.  SKIN:  She does have hemosiderin deposits in her left leg in her calf  with mild edema.   I discussed these findings at length with [name] [name] and reviewed her  office visit from Dr. Oliver Barre on July 11, 2010.  I do not feel  that she has any evidence of significant arterial insufficiency and  would not be at any  increased risk from that standpoint regarding her  potential upcoming surgery for right hip.  She will continue her walking  program as much as possible.  I did explain the importance of  compression garments to reduce her swelling.  She does wear these on a  p.r.n. basis.  She will continue her usual activities and see [place] again  on an as-needed basis.     Emma [name] [name], M.[name].  Electronically Signed   TFE/MEDQ  [name]:  07/25/2010  T:  07/26/2010  Job:  [id number]   cc:   Vikki Ports A. Felicity Coyer, MD  Georges Lynch Darrelyn Hillock, M.[name].

## 2011-01-02 NOTE — Assessment & Plan Note (Signed)
[facility] CARDIOLOGY OFFICE NOTE   NAME:Veach, KAAVYA PUSKARICH                   MRN:          [id number]  DATE:08/31/2010                            DOB:          1927-05-11    [name] is here today for a followup visit and also a preoperative  cardiovascular evaluation for revision of right hip replacement.  This  is requested by Dr. Durene Romans with [facility].  The  patient has the following problem list:  1. Peripheral arterial disease status post bilateral common iliac      stenting in 2002 by Dr. Beverely Pace.  Most recent ABI evaluation was in      October 2011, which was moderately reduced on the right side at      0.55 and mildly reduced on the left side at 0.75.  [name] known to      have significant proximal right superficial femoral artery      stenosis, which is being treated medically.  2. Mild aortic stenosis.  3. Chronic lower extremity edema due to venous insufficiency.  4. Type 2 diabetes.  5. Hypertension.  6. Hyperlipidemia.   INTERIM HISTORY:  The patient is here today for the above-mentioned  reasons.  Since her last visit, she has been having some episodes of  left-sided chest pain which is close to her left shoulder.  This is  described as aching sensation with no radiation to the left arm or the  neck.  She also noted that her dyspnea is worse than before.  [name] not  able to do much physical activities due to her joint problems.  She also  has been complaining of cramping in her right calf, which is mostly  happening at rest and seems to actually get better when she stands up  and starts walking.   MEDICATIONS:  1. Clonidine 0.2 mg four times daily.  2. Hydralazine 25 mg twice daily.  3. Amlodipine 5 mg once daily.  4. Furosemide 40 mg 2 tablets daily.  5. Pravastatin 10 mg at bedtime.  6. Omeprazole 40 mg daily.  7. Aspirin 81 mg once daily.  8. Fish oil 1200 mg three  times daily.  9. Vitamin D once daily.  10.Vitamin C.  11.Neurontin 300 mg at bedtime.  12.Tramadol 50 mg as needed for discomfort.  13.Methocarbamol 500 mg three times daily as needed.   PHYSICAL EXAMINATION:  GENERAL:  This is an elderly female who appears  to be frail.  VITAL SIGNS:  Weight is 158.0 pounds, blood pressure is 132/63, pulse is  98, oxygen saturation is 95% on room air.  HEENT:  Normocephalic, atraumatic.  NECK:  No JVD or carotid bruits.  RESPIRATORY:  Normal respiratory effort with no use of accessory  muscles.  Auscultation reveals normal breath sounds.  CARDIOVASCULAR:  Normal PMI.  Normal S1 and S2 with no gallops.  There  is a 2/6 systolic ejection murmur at the aortic area.  ABDOMEN:  Benign, nontender, nondistended.  EXTREMITIES:  Trace edema bilaterally with chronic stasis dermatitis.  SKIN:  There is chronic stasis dermatitis in the  lower extremities.  PSYCHIATRIC:  [name] alert, oriented x3 with normal mood and affect.   An electrocardiogram was performed which showed sinus rhythm with left  ventricular hypertrophy with repolarization abnormalities.  There is  also a prior old anteroseptal infarct.  This is unchanged when compared  with her most recent ECG.   IMPRESSION:  1. Preoperative cardiovascular evaluation for hip surgery:      Unfortunately, the patient has been having episodes of chest pain      and also has had increased dyspnea with activities.  She has not      had any cardiac stress test since February 2008.  Her functional      capacity is reduced due to her multiple medical conditions.  Due to      her symptoms and multiple risk factors, I recommend proceeding with      a Lexiscan nuclear stress test for further evaluation before her      planned hip surgery.  If the stress test is normal, she will be      considered at moderate risk for the planned surgery due to her      multiple risk factors as well as her age and overall frail  status.  2. Peripheral arterial disease:  [name] having atypical claudication      in the right calf, which is likely due to her known stenosis in the      right superficial femoral artery.  Thus, I will go ahead and start      on Pletal 50 mg twice daily.  3. Hypertension:  Blood pressure is reasonably controlled.  We will      continue with current medications.  The patient will follow up in 4      months from now.  However, she will follow up earlier if her stress      test is abnormal.     Lorine Bears, MD  Electronically Signed    MA/MedQ  DD: 08/31/2010  DT: 09/01/2010  Job #: [id number]

## 2011-01-02 NOTE — Assessment & Plan Note (Signed)
[facility]                         CARDIOLOGY OFFICE NOTE   NAME:Kayes, CIEANNA STORMES                   MRN:          [id number]  DATE:09/12/2009                            DOB:          25-Sep-1926    PROBLEMS:  1. PAD.  2. Hypertension.  3. Diabetes.  4. Hyperlipidemia.   INTERVAL HISTORY:  In the patient's last office visit, her left lower  extremity edema and warmth had improved after a course of doxycycline  and increased Lasix dose.  She also had her left knee injected for  arthritis.  In the interim, the patient states that her swelling, warmth  and erythema of the left lower extremity has reoccurred.  She denies any  fevers or chills or any drainage from her leg, however, it is painful.  Of note, the patient just had two negative Dopplers of the left lower  extremity to rule out DVT.  Other than the erythema and warmth, the  patient states she has been feeling at baseline.  Her blood pressures  have been a bit elevated and she brings a log today indicating there  oftentimes in the 160s systolic.  [name] compliant with her medications.  She again states that the large amount of drug intolerances that she has  that was in the chart are likely valid.   PHYSICAL EXAMINATION:  VITAL SIGNS:  Blood pressure is 146/72, pulse is  90, satting 96% on room air and she weighs 164 pounds.  GENERAL:  [name] in no acute distress.  HEENT:  Normocephalic, atraumatic.  NECK:  Supple.  HEART:  Regular rate and rhythm with a 2/6 systolic ejection murmur  heard loudest at the right upper sternal border that appears to radiate  to the carotids.  LUNGS:  Clear bilaterally.  ABDOMEN:  Soft, nontender, nondistended.  EXTREMITIES:  There is 1+ left lower extremity edema.  Her left lower  leg has some diffuse warmth and erythema.  Her left dorsalis pedis pulse  is 2+ and she has sensation throughout.   ASSESSMENT/PLAN:  1. Left lower extremity edema.   This may be recurrence of cellulitis.      Because of her diabetes, there are likely several organisms      involved.  We discussed the possibility of a hospital admission for      intravenous antibiotics and further workup.  At this time, we will      defer that for a 1-week course of outpatient antibiotics which will      be Levaquin 500 mg daily and clindamycin 450 mg q.8 hours.  Today      as an outpatient we will check blood cultures x2 and a CBC with      differential.  If her symptoms worsen or do not improve in the next      week, we will arrange for inpatient evaluation and treatment.  2. Hypertension.  Unfortunately, the patient has multiple medicine      intolerances.  Ideally, a medication that suppresses her renin      would be ideal for her as her renin level was 55.  However, she      states that on beta blockers her heart had extended pauses.  She      was unable to tolerate the Tekturna for unclear reasons.  She was      already taking Diovan.  This would limit her options to adding an      ACE inhibitor to the Diovan, however, the patient's BUN and      creatinine were 41 and 1.35 with a potassium of 4.7 making the      addition of an ACE inhibitor to an ARB suboptimal.  I offered to      increase the hydralazine however, the patient states that she      thinks that that would possibly cause esophageal problems.  She      states that most of the medication intolerances were from      esophageal problems.  We will keep her doses where they are for now      and ask for more detailed clinic records from her past physician to      see what exactly some of these intolerances were.  We will also get      the results for Holter monitor to see if she actually has pauses      that preclude beta-blocker use.  3. Heart murmur, she is scheduled to have a transthoracic echo in      March.  4. Hyperlipidemia.  Lipids are at goal.  5. Peripheral arterial disease.  She has 2+ pulse in  the extremities      and no symptoms of claudication.  She should continue on aspirin      and statin therapy.  We will see the patient back in one week's      time or she will contact us sooner if her left lower extremity      symptoms worsen.     Brayton El, MD  Electronically Signed    SGA/MedQ  DD: 09/12/2009  DT: 09/13/2009  Job #: [id number]

## 2011-01-02 NOTE — Assessment & Plan Note (Signed)
[facility]                         CARDIOLOGY OFFICE NOTE   NAME:Emma [name]                   MRN:          [id number]  DATE:01/11/2010                            DOB:          1927/04/06    PROBLEM LIST:  1. Peripheral arterial disease.  2. Hypertension.  3. Diabetes.  4. Hyperlipidemia.   INTERVAL HISTORY:  Since that last visit, we had made a referral to  Dermatology for wrapping of her left lower extremity secondary to  persistent edema.  She had this done and the edema has significantly  improved.  She wore some TED hose, but was unable to tolerate TED hose.  She continued to use Lasix daily.  Her main complaint today is feeling  as if water is running down both of her legs.  This is not necessarily  painful.  She had previously had some pain in her legs and was started  on gabapentin 300 mg every evening and this has resolved the discomfort.  She denies any significant chest pain or calf claudication.  She states  that she is weak and her legs feel fatigued.   PHYSICAL EXAMINATION:  VITAL SIGNS:  Blood pressure is 122/53, pulse 72,  saturating 93% on room air.  She weighs 159 pounds which is 5 pounds  less than she weighed in February of this year.  GENERAL:  No acute distress.  HEART:  Regular rate and rhythm without murmur, rub, or gallop.  LUNGS:  Clear bilaterally.  ABDOMEN:  Soft, nontender, nondistended.  EXTREMITIES:  Without edema.  SKIN:  Warm and dry.   ASSESSMENT AND PLAN:  The patient's lower extremity edema has  fortunately resolved.  [name] not having any symptoms consistent with  angina.  The patient will have her lipid profile followed by her primary  care physician.  Regarding her peripheral arterial disease, she had an  angiogram in September 2010 that showed moderate left renal artery  stenosis without associated pullback gradient.  She also has diffuse  distal abdominal aortic disease that was  moderate, again without a  significant pullback gradient.  Her symptoms of leg weakness have not  changed significantly since that time and she is not having any clear  claudication.  We will continue to follow her.     Brayton El, MD  Electronically Signed    SGA/MedQ  DD: 01/11/2010  DT: 01/12/2010  Job #: [id number]

## 2011-01-04 ENCOUNTER — Encounter: Payer: Self-pay | Admitting: Internal Medicine

## 2011-01-08 ENCOUNTER — Encounter: Payer: Self-pay | Admitting: *Deleted

## 2011-01-08 ENCOUNTER — Encounter: Payer: Self-pay | Admitting: Cardiovascular Disease

## 2011-01-08 ENCOUNTER — Ambulatory Visit (INDEPENDENT_AMBULATORY_CARE_PROVIDER_SITE_OTHER): Payer: Medicare Other | Admitting: Cardiovascular Disease

## 2011-01-08 ENCOUNTER — Telehealth: Payer: Self-pay | Admitting: Cardiology

## 2011-01-08 DIAGNOSIS — R9439 Abnormal result of other cardiovascular function study: Secondary | ICD-10-CM

## 2011-01-08 DIAGNOSIS — I739 Peripheral vascular disease, unspecified: Secondary | ICD-10-CM | POA: Insufficient documentation

## 2011-01-08 DIAGNOSIS — I872 Venous insufficiency (chronic) (peripheral): Secondary | ICD-10-CM | POA: Insufficient documentation

## 2011-01-08 MED ORDER — PRAVASTATIN SODIUM 20 MG PO TABS: 20.0000 mg | ORAL_TABLET | Freq: Every evening | ORAL | Status: DC

## 2011-01-08 MED ORDER — GABAPENTIN 300 MG PO CAPS: 600.0000 mg | ORAL_CAPSULE | Freq: Every day | ORAL | Status: DC

## 2011-01-08 MED ORDER — AMLODIPINE BESYLATE 2.5 MG PO TABS: 2.5000 mg | ORAL_TABLET | Freq: Every day | ORAL | Status: DC

## 2011-01-08 MED ORDER — CILOSTAZOL 50 MG PO TABS: 50.0000 mg | ORAL_TABLET | Freq: Two times a day (BID) | ORAL | Status: DC

## 2011-01-08 NOTE — Assessment & Plan Note (Signed)
Her previous nuclear stress test showed evidence of possible apical ischemia. She underwent her hip surgery at a moderate risk. However, she had significant cardiac complications with atrial fibrillation with rapid ventricular response, hypotension as well as significant ST depression on her ECG. Her current symptoms include dyspnea with minimal activities with no chest pain. Due to that, I recommend proceeding with cardiac catheterization and possible coronary intervention. There is no contraindication for long-term well antiplatelet therapy. However, the bare-metal stent is probably preferred given the possibility of undergoing another hip surgery in the near future.

## 2011-01-08 NOTE — Progress Notes (Signed)
HPI  Emma Yu is an 77 year old female who is here today for a followup visit. She still recovering from her hip surgery. [name] now able to put more than 25% weightbearing but the healing process is still very slow. She tells me that she would be treated conservatively unless the condition worsens. She has not been doing much physical activities due to this. She does complain of significant dyspnea but there has been no chest pain. Her postoperative course was complicated by atrial fibrillation with rapid ventricular response with significant ST depression in her ECG which required urgent cardioversion due to instability and not responding to medications. [name] also complaining of increased swelling in her lower extremities. Her amlodipine dose was decreased to 5 mg daily. [name] also having burning sensation in both feet especially at night. [name] having hard time sleeping at night due to that reason.  Allergies  Allergen Reactions  . Carvedilol Other (See Comments)    Heart stops  . Diltiazem Hcl   . Fenofibrate   . Hctz (Hydrochlorothiazide)   . Nisoldipine   . Sular   . Tekturna (Aliskiren Fumarate)   . Valturna (Aliskiren-Valsartan)      Current Outpatient Prescriptions on File Prior to Visit  Medication Sig Dispense Refill  . acetaminophen (TYLENOL) 325 MG tablet As needed       . Ascorbic Acid (VITAMIN C) 500 MG tablet Take 500 mg by mouth daily.        Marland Kitchen aspirin 81 MG tablet Take 2 at bedtime       . Cholecalciferol (VITAMIN D3) 2000 UNITS capsule Take 2,000 Units by mouth daily.        . Cinnamon 500 MG capsule 4 capsules daily       . cloNIDine (CATAPRES) 0.2 MG tablet Take 0.2 mg by mouth 4 (four) times daily.        . Coenzyme Q10 10 MG capsule (300mg  caps) 1 capsule by mouth daily       . furosemide (LASIX) 40 MG tablet Take 40 mg by mouth daily.       . insulin lispro (HUMALOG) 100 UNIT/ML injection continuous pump      . losartan (COZAAR) 100 MG tablet Take 100 mg  by mouth daily.        . methocarbamol (ROBAXIN) 500 MG tablet Take 500 mg by mouth 3 (three) times daily as needed. 1 tablet tid as needed for cramps       . metoprolol tartrate (LOPRESSOR) 25 MG tablet Take 1 tablet by mouth Twice daily.      . naproxen sodium (ALEVE) 220 MG tablet as needed.        . Omega-3 Fatty Acids (FISH OIL) 1200 MG CAPS Take 1,000 mg by mouth 2 (two) times daily.       Marland Kitchen omeprazole (PRILOSEC) 40 MG capsule Take 40 mg by mouth daily.        . polyethylene glycol powder (GLYCOLAX/MIRALAX) powder Take by mouth as needed.      . senna (SENOKOT) 8.6 MG tablet Take 2 tablets by mouth at bedtime.        . traMADol (ULTRAM) 50 MG tablet Take 50 mg by mouth every 6 (six) hours as needed.        Marland Kitchen DISCONTD: amLODipine (NORVASC) 10 MG tablet Take 5 mg by mouth at bedtime.       Marland Kitchen DISCONTD: gabapentin (NEURONTIN) 300 MG capsule 1 capsule by mouth every night as needed       .  DISCONTD: calcium-vitamin D (OSCAL WITH D 500-200) 500-200 MG-UNIT per tablet Take 1 tablet by mouth 2 (two) times daily.        Marland Kitchen DISCONTD: cilostazol (PLETAL) 50 MG tablet Take 50 mg by mouth 2 (two) times daily.        Marland Kitchen DISCONTD: ferrous sulfate 325 (65 FE) MG tablet Take 325 mg by mouth 3 (three) times daily with meals.        Marland Kitchen DISCONTD: hydrALAZINE (APRESOLINE) 25 MG tablet Take 25 mg by mouth 2 (two) times daily.        Marland Kitchen DISCONTD: insulin glargine (LANTUS) 100 UNIT/ML injection as directed.        Marland Kitchen DISCONTD: Multiple Vitamins-Calcium (ONE-A-DAY WOMENS FORMULA) TABS Take 1 tablet by mouth daily.        Marland Kitchen DISCONTD: nystatin (MYCOSTATIN) powder Apply topically 4 (four) times daily. As needed       . DISCONTD: pravastatin (PRAVACHOL) 10 MG tablet Take 10 mg by mouth at bedtime.        Marland Kitchen DISCONTD: zolpidem (AMBIEN) 5 MG tablet Take 5 mg by mouth at bedtime as needed.           Past Medical History  Diagnosis Date  . PAD (peripheral artery disease)     s/p bilateral comon iliac artery stenting in  2002. Known significant  R SFA  disease  . Aortic stenosis     Mild  . Diabetes mellitus   . Hypertension   . Hyperlipidemia   . Venous insufficiency   . Heart murmur   . CHF (congestive heart failure)     normal EF  . Atrial fib/flutter, transient   . Atrial fibrillation     post op     Past Surgical History  Procedure Date  . Total hip arthroplasty 1991, 1994    redo in 1994  . Lumbar disc surgery 1999     Family History  Problem Relation Age of Onset  . Diabetes Father   . Diabetes Other     5/8 sibs     History   Social History  . Marital Status: Widowed    Spouse Name: N/A    Number of Children: N/A  . Years of Education: N/A   Occupational History  .      retired   Social History Main Topics  . Smoking status: Former Smoker -- 1.5 packs/day for 25 years    Types: Cigarettes    Quit date: 08/20/1985  . Smokeless tobacco: Not on file   Comment: Retired-widowed 89, lives with son  . Alcohol Use:   . Drug Use: No  . Sexually Active:    Other Topics Concern  . Not on file   Social History Narrative   Lives with sonWidowed 1991Quit smoking in 1987      PHYSICAL EXAM   BP 114/57  Pulse 68  Ht 5\' 4"  (1.626 m)  Wt 150 lb (68.04 kg)  BMI 25.75 kg/m2  SpO2 94%  Constitutional: [name] oriented to person, place, and time. She appears well-developed and well-nourished. No distress.  HENT: No nasal discharge.  Head: Normocephalic and atraumatic.  Eyes: Pupils are equal, round, and reactive to light. Right eye exhibits no discharge. Left eye exhibits no discharge.  Neck: Normal range of motion. Neck supple. No JVD present. No thyromegaly present.  Cardiovascular: Normal rate, regular rhythm, normal heart sounds and intact distal pulses. Exam reveals no gallop and no friction rub.  There is a 2/6  systolic ejection murmur at the aortic area.  Pulmonary/Chest: Effort normal and breath sounds normal. No stridor. No respiratory distress. She has no  wheezes. She has no rales. She exhibits no tenderness.  Abdominal: Soft. Bowel sounds are normal. She exhibits no distension. There is no tenderness. There is no rebound and no guarding.  Musculoskeletal: Normal range of motion. There is +1 edema bilaterally with tenderness.  Neurological: [name] alert and oriented to person, place, and time. Coordination normal.  Skin: Skin is warm and dry. Stasis dermatitis is noted. [name] not diaphoretic. No erythema. No pallor.  Psychiatric: She has a normal mood and affect. Her behavior is normal. Judgment and thought content normal.      ASSESSMENT AND PLAN

## 2011-01-08 NOTE — Assessment & Plan Note (Signed)
[name] having increased lower extremity edema. I'm hesitant to increase her Lasix dose due to previous history of acute renal failure and volume depletion. Amlodipine might be contributing and thus we'll decrease the dose to 2.5 mg once daily.

## 2011-01-08 NOTE — Patient Instructions (Signed)
Your physician recommends that you schedule a follow-up appointment in: after cath  Your physician has requested that you have a cardiac catheterization. Cardiac catheterization is used to diagnose and/or treat various heart conditions. Doctors may recommend this procedure for a number of different reasons. The most common reason is to evaluate chest pain. Chest pain can be a symptom of coronary artery disease (CAD), and cardiac catheterization can show whether plaque is narrowing or blocking your heart's arteries. This procedure is also used to evaluate the valves, as well as measure the blood flow and oxygen levels in different parts of your heart. For further information please visit [email or web address] Please follow instruction sheet, as given.  Your physician has recommended you make the following change in your medication: decrease Amlodipine 2.5 mg daily; restart Pletal 50 mg twice daily; restart Pravastatin 20 at bedtime; increase Gabapentin 300 mg 2 capsules at bedtime

## 2011-01-08 NOTE — Assessment & Plan Note (Signed)
The patient has known history of peripheral arterial disease. It's unclear if she is having claudication given her low activity level. The burning sensation in her feet at night is probably due to neuropathy. She was on cilostazol 50 mg twice daily which will be resumed today. Also I will increase her Neurontin dose to 600 mg at bedtime.

## 2011-01-08 NOTE — Telephone Encounter (Signed)
Mailed Home Health Certification and Plan of Care out today to [facility]  [address] [place], [address] 01/08/11/km..Copy kept in drawer

## 2011-01-10 ENCOUNTER — Ambulatory Visit (HOSPITAL_COMMUNITY)
Admission: RE | Admit: 2011-01-10 | Discharge: 2011-01-10 | Disposition: A | Discharge status: Home / Self Care | Payer: Medicare Other | Source: Ambulatory Visit | Attending: Cardiovascular Disease | Admitting: Cardiovascular Disease

## 2011-01-10 DIAGNOSIS — Z96649 Presence of unspecified artificial hip joint: Secondary | ICD-10-CM | POA: Insufficient documentation

## 2011-01-10 DIAGNOSIS — I359 Nonrheumatic aortic valve disorder, unspecified: Secondary | ICD-10-CM | POA: Insufficient documentation

## 2011-01-10 DIAGNOSIS — R9439 Abnormal result of other cardiovascular function study: Secondary | ICD-10-CM | POA: Insufficient documentation

## 2011-01-10 DIAGNOSIS — I872 Venous insufficiency (chronic) (peripheral): Secondary | ICD-10-CM | POA: Insufficient documentation

## 2011-01-10 DIAGNOSIS — E785 Hyperlipidemia, unspecified: Secondary | ICD-10-CM | POA: Insufficient documentation

## 2011-01-10 DIAGNOSIS — R0989 Other specified symptoms and signs involving the circulatory and respiratory systems: Secondary | ICD-10-CM | POA: Insufficient documentation

## 2011-01-10 DIAGNOSIS — I739 Peripheral vascular disease, unspecified: Secondary | ICD-10-CM | POA: Insufficient documentation

## 2011-01-10 DIAGNOSIS — Z794 Long term (current) use of insulin: Secondary | ICD-10-CM | POA: Insufficient documentation

## 2011-01-10 DIAGNOSIS — R0609 Other forms of dyspnea: Secondary | ICD-10-CM | POA: Insufficient documentation

## 2011-01-10 DIAGNOSIS — I10 Essential (primary) hypertension: Secondary | ICD-10-CM | POA: Insufficient documentation

## 2011-01-10 DIAGNOSIS — R9430 Abnormal result of cardiovascular function study, unspecified: Secondary | ICD-10-CM

## 2011-01-10 DIAGNOSIS — I509 Heart failure, unspecified: Secondary | ICD-10-CM | POA: Insufficient documentation

## 2011-01-10 HISTORY — PX: CARDIAC CATHETERIZATION: SHX172

## 2011-01-10 LAB — BASIC METABOLIC PANEL
BUN: 25 mg/dL — ABNORMAL HIGH (ref 6–23)
Calcium: 10.7 mg/dL — ABNORMAL HIGH (ref 8.4–10.5)
GFR calc non Af Amer: >60 mL/min (ref >60)
Glucose, Bld: 162 mg/dL — ABNORMAL HIGH (ref 70–99)
Potassium: 4.2 mEq/L (ref 3.5–5.1)
Sodium: 140 mEq/L (ref 135–145)

## 2011-01-10 LAB — CBC
HCT: 37.4 % (ref 36.0–46.0)
MCV: 90.3 fL (ref 78.0–100.0)
RBC: 4.14 MIL/uL (ref 3.87–5.11)
WBC: 6.3 10*3/uL (ref 4.0–10.5)

## 2011-01-10 LAB — PROTIME-INR
INR: 1.01 (ref 0.00–1.49)
Prothrombin Time: 13.5 seconds (ref 11.6–15.2)

## 2011-01-10 LAB — GLUCOSE, CAPILLARY: Glucose-Capillary: 151 mg/dL — ABNORMAL HIGH (ref 70–99)

## 2011-01-17 NOTE — Cardiovascular Report (Signed)
NAME:  Emma Yu, Emma Yu            ACCOUNT NO.:  [id number]  MEDICAL RECORD NO.:  [id number]           PATIENT TYPE:  O  LOCATION:  [facility]                         FACILITY:  [facility]  PHYSICIAN:  Lorine Bears, MD     DATE OF BIRTH:  1927-06-20  DATE OF PROCEDURE:  01/10/2011 DATE OF DISCHARGE:                           CARDIAC CATHETERIZATION   PROCEDURES PERFORMED: 1. Left heart catheterization. 2. Coronary angiography.  Access: right radial artery.  INDICATIONS AND CLINICAL HISTORY:  Emma Yu is an 77 year old female with no known history of coronary artery disease.  She has history of mild aortic stenosis and multiple other chronic medical conditions.  She was seen a few months ago for a preoperative cardiovascular evaluation for a hip surgery.  She underwent nuclear stress test, which showed evidence of apical ischemia.  She was cleared to have the surgery done at moderate risk.  She underwent a surgery on her hip here at [facility].  Postoperative course was complicated by atrial fibrillation with rapid ventricular response with significant ST depression on her ECG and hemodynamic instability.  She was ultimately converted to normal sinus rhythm.  Due to her cardiac complications and significant ST depression with symptoms of dyspnea with minimal exertion and abnormal stress test, cardiac catheterization was recommended.  Risks, benefits, and alternatives were discussed with the patient.  STUDY DETAILS:  A standard informed consent was obtained.  The right radial area was prepped in a sterile fashion.  It was anesthetized with 1% lidocaine.  A 5-French sheath was placed in the right radial artery. Verapamil 3 mg was given through the sheath.  3000 units of unfractionated heparin was given intravenously.  Coronary angiography was performed with a TIG catheter over a Wholey wire.  There was significant tortuosity in the right innominate artery, but I was able  to negotiate that.  I was able to cross the aortic valve with a TIG catheter over a wire.  The central aortic and left ventricular pressures were recorded with a pullback.  The patient tolerated the procedure well with no immediate complications.  The sheath was removed and a TR band was applied.  STUDY FINDINGS:  Hemodynamic findings:  The aortic pressure is 183/61 with a mean pressure of 106 mmHg.  Left ventricular pressure is 174/11 with a left ventricular end-diastolic pressure of 15 mmHg.  No significant gradient was noted across the aortic valve.  CORONARY ANGIOGRAPHY:  Left main coronary artery:  The vessel is a normal in size and free of significant disease. Left circumflex artery:  The vessel was medium in size and nondominant. It was free of any significant disease with minor irregularities.  OM-1 and OM-2 are very small-sized branches.  OM-3 is normal in size. Left anterior descending artery:  The vessel was normal in size with minor irregularities and overall free of significant disease.  The first diagonal is medium in size, second diagonal is a large size branch, and third diagonal is very small in size. Right coronary artery:  The vessel is a large size and dominant.  It has minor irregularities with no significant disease.  The right PDA is  large size branch and free of significant disease.  STUDY CONCLUSIONS: 1. Mild luminal irregularities with no significant coronary artery     disease. 2. Moderate systemic hypertension with mildly elevated left     ventricular end-diastolic pressure.  RECOMMENDATIONS:  Medical therapy is recommended and controlling blood pressure.     Lorine Bears, MD     MA/MEDQ  D:  01/10/2011  T:  01/11/2011  Job:  [id number]  Electronically Signed by Lorine Bears MD on 01/17/2011 03:47:11 PM

## 2011-01-19 ENCOUNTER — Encounter: Payer: Self-pay | Admitting: Internal Medicine

## 2011-01-19 ENCOUNTER — Other Ambulatory Visit (INDEPENDENT_AMBULATORY_CARE_PROVIDER_SITE_OTHER): Payer: Medicare Other

## 2011-01-19 ENCOUNTER — Ambulatory Visit (INDEPENDENT_AMBULATORY_CARE_PROVIDER_SITE_OTHER): Payer: Medicare Other | Admitting: Internal Medicine

## 2011-01-19 ENCOUNTER — Ambulatory Visit (INDEPENDENT_AMBULATORY_CARE_PROVIDER_SITE_OTHER): Payer: Medicare Other | Admitting: Endocrinology

## 2011-01-19 ENCOUNTER — Encounter: Payer: Self-pay | Admitting: Endocrinology

## 2011-01-19 VITALS — BP 138/58 | HR 64 | Temp 97.9°F | Resp 14 | Wt 155.2 lb

## 2011-01-19 DIAGNOSIS — I10 Essential (primary) hypertension: Secondary | ICD-10-CM

## 2011-01-19 DIAGNOSIS — E109 Type 1 diabetes mellitus without complications: Secondary | ICD-10-CM

## 2011-01-19 DIAGNOSIS — M1990 Unspecified osteoarthritis, unspecified site: Secondary | ICD-10-CM

## 2011-01-19 DIAGNOSIS — I872 Venous insufficiency (chronic) (peripheral): Secondary | ICD-10-CM

## 2011-01-19 DIAGNOSIS — E119 Type 2 diabetes mellitus without complications: Secondary | ICD-10-CM

## 2011-01-19 LAB — HEMOGLOBIN A1C: Hgb A1c MFr Bld: 6.6 % — ABNORMAL HIGH (ref 4.6–6.5)

## 2011-01-19 NOTE — Assessment & Plan Note (Signed)
Last fall 2011, on lasix 80 am and 40pm, now only 40mg  am - Will increase diuretic dose to reduce some of the swelling Agree with continued increase gabapentin dose

## 2011-01-19 NOTE — Assessment & Plan Note (Signed)
S/p r THR 10/2010 - less pain but not resolved -  Cont tramadol as ongoing with tylenol and gabapentin

## 2011-01-19 NOTE — Patient Instructions (Addendum)
Blood tests are being ordered for you today.  a few days after the test(s), please call [phone] to hear your test results. pending the test results, please continue basal rate of 0.9 unit/hr, 24 hrs per day.   continue bolus of 16 units with each meal. continue correction bolus (which some people call "sensitivity," or "insulin sensitivity ratio," or just "isr") of 1 unit for each by 25 which your glucose exceeds 120. check your blood sugar 4 times a day--before the 3 meals, and at bedtime.  also check if you have symptoms of your blood sugar being too high or too low.  please keep a record of the readings and bring it to your next appointment here.  please call us sooner if you are having low blood sugar episodes. good diet and exercise habits significanly improve the control of your diabetes.  please let me know if you wish to be referred to a dietician.  high blood sugar is very risky to your health.  you should see an eye doctor every year. controlling your blood pressure and cholesterol drastically reduces the damage diabetes does to your body.  this also applies to quitting smoking.  please discuss these with your doctor.  you should take an aspirin every day, unless you have been advised by a doctor not to. Please make a follow-up appointment in 3 months.

## 2011-01-19 NOTE — Assessment & Plan Note (Signed)
On reduced amlodipine due to edema - not unreasonable control, esp considering discomfort contribution /pain BP Readings from Last 3 Encounters:  01/19/11 138/58  01/19/11 138/58  01/08/11 114/57

## 2011-01-19 NOTE — Progress Notes (Signed)
Subjective:    Patient ID: Emma Yu, female    DOB: 1927/04/18, 77 y.o.   MRN: [id number]  HPI Pt says she feels better since her recent hip surgery, and has regained the lew lbs she lost.  She was on a ventilator x 3 days.  no cbg record, but states cbg's are well-controlled. She says it is in general higher as the day goes on.   Past Medical History  Diagnosis Date  . PAD (peripheral artery disease)     s/p bilateral comon iliac artery stenting in 2002. Known significant  R SFA  disease  . Aortic stenosis     Mild  . Diabetes mellitus   . Hypertension   . Hyperlipidemia   . Venous insufficiency   . Heart murmur   . CHF (congestive heart failure)     normal EF  . Atrial fib/flutter, transient   . Atrial fibrillation     post op    Past Surgical History  Procedure Date  . Total hip arthroplasty 1991, 1994    redo in 1994  . Lumbar disc surgery 1999    History   Social History  . Marital Status: Widowed    Spouse Name: N/A    Number of Children: N/A  . Years of Education: N/A   Occupational History  .      retired   Social History Main Topics  . Smoking status: Former Smoker -- 1.5 packs/day for 25 years    Types: Cigarettes    Quit date: 08/20/1985  . Smokeless tobacco: Not on file   Comment: Retired-widowed 89, lives with son  . Alcohol Use:   . Drug Use: No  . Sexually Active:    Other Topics Concern  . Not on file   Social History Narrative   Lives with sonWidowed 1991Quit smoking in 1987    Current Outpatient Prescriptions on File Prior to Visit  Medication Sig Dispense Refill  . acetaminophen (TYLENOL) 325 MG tablet As needed       . amLODipine (NORVASC) 2.5 MG tablet Take 1 tablet (2.5 mg total) by mouth daily.  30 tablet  6  . Ascorbic Acid (VITAMIN C) 500 MG tablet Take 500 mg by mouth daily.        Marland Kitchen aspirin 81 MG tablet Take 2 at bedtime       . cilostazol (PLETAL) 50 MG tablet Take 1 tablet (50 mg total) by mouth 2 (two) times  daily.  60 tablet  6  . Cinnamon 500 MG capsule 4 capsules daily       . cloNIDine (CATAPRES) 0.2 MG tablet Take 0.2 mg by mouth 4 (four) times daily.        . Coenzyme Q10 10 MG capsule (300mg  caps) 1 capsule by mouth daily       . gabapentin (NEURONTIN) 300 MG capsule Take 2 capsules (600 mg total) by mouth at bedtime.  60 capsule  6  . insulin lispro (HUMALOG) 100 UNIT/ML injection continuous pump      . losartan (COZAAR) 100 MG tablet Take 100 mg by mouth daily.        . methocarbamol (ROBAXIN) 500 MG tablet Take 500 mg by mouth 3 (three) times daily as needed. 1 tablet tid as needed for cramps       . metoprolol tartrate (LOPRESSOR) 25 MG tablet Take 1 tablet by mouth Twice daily.      . naproxen sodium (ALEVE) 220 MG tablet as needed.        Marland Kitchen  Omega-3 Fatty Acids (FISH OIL) 1200 MG CAPS Take 1,000 mg by mouth 2 (two) times daily.       Marland Kitchen omeprazole (PRILOSEC) 40 MG capsule Take 40 mg by mouth daily.        . pravastatin (PRAVACHOL) 20 MG tablet Take 1 tablet (20 mg total) by mouth every evening.  30 tablet  6  . senna (SENOKOT) 8.6 MG tablet Take 2 tablets by mouth at bedtime.        . traMADol (ULTRAM) 50 MG tablet Take 50 mg by mouth every 6 (six) hours as needed.        . Cholecalciferol (VITAMIN D3) 2000 UNITS capsule Take 2,000 Units by mouth daily.        . polyethylene glycol powder (GLYCOLAX/MIRALAX) powder Take by mouth as needed.      Marland Kitchen DISCONTD: furosemide (LASIX) 40 MG tablet Take 40 mg by mouth daily.         Allergies  Allergen Reactions  . Carvedilol Other (See Comments)    Heart stops  . Diltiazem Hcl   . Fenofibrate   . Hctz (Hydrochlorothiazide)   . Nisoldipine   . Sular   . Tekturna (Aliskiren Fumarate)   . Valturna (Aliskiren-Valsartan)     Family History  Problem Relation Age of Onset  . Diabetes Father   . Diabetes Other     5/8 sibs    BP 138/58  Pulse 64  Temp(Src) 97.9 F (36.6 C) (Oral)  Resp 14  Wt 155 lb 4 oz (70.421 kg)  SpO2  97%  Review of Systems Denies loc    Objective:   Physical Exam GENERAL: no distress Gait: steady with a walker. SKIN: Insulin infusion sites at the anterior abdomen are normal        Assessment & Plan:  Dm, she needs some adjustment in her therapy

## 2011-01-19 NOTE — Progress Notes (Signed)
Subjective:    Patient ID: Emma Yu, female    DOB: 26-Jun-1927, 77 y.o.   MRN: [id number]  HPI Here for follow up - reviewed chronic medical issues today:  DM2 - increase due to pletal per pt - reports compliance with ongoing medical treatment and no changes in medication dose or frequency. denies adverse side effects related to current therapy. denies hypoglycemia symptoms - follows with endo for same  dyslipidemia - reports compliance with ongoing medical treatment and no changes in medication dose or frequency. denies adverse side effects related to current therapy. no GI or muscle SE or c/o  HTN - reports compliance with ongoing medical treatment and no changes in medication dose or frequency. denies adverse side effects related to current therapy. no HA or CP -  chronic LBP - improved after R THR 10/2010 but not resolved - on tramadol instead of vicodin  PVD/PAD - now on pletal and follows with cards for same - continued burning pain in legs despite increase gabapentin - leg pain associated with redness and tight swelling BLE (less lasix than prior to hosp)  Past Medical History  Diagnosis Date  . PAD (peripheral artery disease)     s/p bilateral comon iliac artery stenting in 2002. Known significant  R SFA  disease  . Aortic stenosis     Mild  . Diabetes mellitus   . Hypertension   . Hyperlipidemia   . Venous insufficiency   . Heart murmur   . CHF (congestive heart failure)     normal EF  . Atrial fib/flutter, transient   . Atrial fibrillation     post op  . DJD (degenerative joint disease) of hip     s/p R THR 10/2010      Review of Systems  Constitutional: Negative for fever.  Respiratory: Negative for shortness of breath.   Cardiovascular: Positive for leg swelling. Negative for palpitations.  Neurological: Negative for dizziness and headaches.  Psychiatric/Behavioral: Negative for confusion.       Objective:   Physical Exam BP 138/58  Pulse 64   Temp(Src) 97.9 F (36.6 C) (Oral)  Resp 14  Wt 155 lb 4 oz (70.421 kg)  SpO2 97% Physical Exam  Constitutional: [name] oriented to person, place, and time. She appears well-developed and well-nourished. No distress. Friend at side Neck: Normal range of motion. Neck supple. No JVD present. No thyromegaly present.  Cardiovascular: Normal rate, regular rhythm and normal heart sounds.  No murmur heard. tight 1+ BLE edema with venous insuff and blistering/erythema distal LE. Pulmonary/Chest: Effort normal and breath sounds normal. No respiratory distress. She has no wheezes.  Neurological: [name] alert and oriented to person, place, and time. No cranial nerve deficit. Coordination normal.  Skin: Chronic venous insuff changes of distal BLE, L>R. No rash noted. No erythema. +weeping Psychiatric: She has a normal mood and affect. Her behavior is normal. Judgment and thought content normal.       Lab Results  Component Value Date   WBC 6.3 01/10/2011   HGB 12.2 01/10/2011   HCT 37.4 01/10/2011   PLT 180 01/10/2011   CHOL 156 02/03/2010   TRIG 188.0* 02/03/2010   HDL 47.00 02/03/2010   ALT 41* 11/13/2010   AST 30 11/13/2010   NA 140 01/10/2011   K 4.2 01/10/2011   CL 101 01/10/2011   CREATININE 0.81 01/10/2011   BUN 25* 01/10/2011   CO2 31 01/10/2011   TSH 1.484 10/30/2010   INR 1.01  01/10/2011   HGBA1C 6.6* 01/19/2011    Assessment & Plan:  See problem list. Medications and labs reviewed today. Time spent with pt today 35 minutes, greater than 50% time spent counseling patient on recent cath for CAD/PAD, leg pain/venous insuff and medication review. Also review of prior records/interval hx

## 2011-01-19 NOTE — Patient Instructions (Signed)
It was good to see you today. We have reviewed your prior records including labs and tests today Increase furosemide to 2 pills every am, total 80mg  each day All medications reviewed, no other changes at this time. Please schedule followup in 3 months, call sooner if problems.

## 2011-01-22 ENCOUNTER — Ambulatory Visit (INDEPENDENT_AMBULATORY_CARE_PROVIDER_SITE_OTHER): Payer: Medicare Other | Admitting: Cardiovascular Disease

## 2011-01-22 ENCOUNTER — Encounter: Payer: Self-pay | Admitting: Cardiovascular Disease

## 2011-01-22 DIAGNOSIS — R9439 Abnormal result of other cardiovascular function study: Secondary | ICD-10-CM

## 2011-01-22 DIAGNOSIS — I872 Venous insufficiency (chronic) (peripheral): Secondary | ICD-10-CM

## 2011-01-22 DIAGNOSIS — I739 Peripheral vascular disease, unspecified: Secondary | ICD-10-CM

## 2011-01-22 NOTE — Patient Instructions (Signed)
Your physician recommends that you schedule a follow-up appointment in: 4 MONTHS. 

## 2011-01-22 NOTE — Assessment & Plan Note (Signed)
The patient's leg discomfort improved after resuming Pletal. Would continue current dose. We'll reserve angiography for refractory symptoms given her age and multiple other comorbidities.

## 2011-01-22 NOTE — Progress Notes (Signed)
HPI  This is an 77 year old female who is here today for a followup visit. I recently performed cardiac catheterization which overall showed no significant coronary artery disease. [name] doing reasonably well. The dose of  Lasix was increased recently do to increased leg edema. I also recently decreased the dose of amlodipine to 2.5 mg once daily for the same reason. The dose of Neurontin was increased and also resumed Pletal due to burning sensation in her legs and some atypical claudication. She has no history of peripheral arterial disease. Her lower extremity discomfort has decreased. She denies any chest pain or palpitations.  Allergies  Allergen Reactions  . Carvedilol Other (See Comments)    Heart stops  . Diltiazem Hcl   . Fenofibrate   . Hctz (Hydrochlorothiazide)   . Nisoldipine   . Sular   . Tekturna (Aliskiren Fumarate)   . Valturna (Aliskiren-Valsartan)      Current Outpatient Prescriptions on File Prior to Visit  Medication Sig Dispense Refill  . acetaminophen (TYLENOL) 325 MG tablet As needed       . amLODipine (NORVASC) 2.5 MG tablet Take 1 tablet (2.5 mg total) by mouth daily.  30 tablet  6  . Ascorbic Acid (VITAMIN C) 500 MG tablet Take 500 mg by mouth daily.        Marland Kitchen aspirin 81 MG tablet Take 2 at bedtime       . CALCIUM-VITAMIN D PO Take 2 each by mouth daily.        . Cholecalciferol (VITAMIN D3) 2000 UNITS capsule Take 2,000 Units by mouth daily.        . cilostazol (PLETAL) 50 MG tablet Take 1 tablet (50 mg total) by mouth 2 (two) times daily.  60 tablet  6  . Cinnamon 500 MG capsule 4 capsules daily       . cloNIDine (CATAPRES) 0.2 MG tablet Take 0.2 mg by mouth 4 (four) times daily.        . Coenzyme Q10 10 MG capsule (300mg  caps) 1 capsule by mouth daily       . furosemide (LASIX) 40 MG tablet Take 2 tablets (80 mg total) by mouth daily.  60 tablet  6  . gabapentin (NEURONTIN) 300 MG capsule Take 2 capsules (600 mg total) by mouth at bedtime.  60 capsule  6    . insulin lispro (HUMALOG) 100 UNIT/ML injection continuous pump      . losartan (COZAAR) 100 MG tablet Take 100 mg by mouth daily.        . methocarbamol (ROBAXIN) 500 MG tablet Take 500 mg by mouth 3 (three) times daily as needed. 1 tablet tid as needed for cramps       . metoprolol tartrate (LOPRESSOR) 25 MG tablet Take 1 tablet by mouth Twice daily.      . Multiple Vitamins-Minerals (OCUVITE ADULT 50+ PO) Take 2 each by mouth daily.        . naproxen sodium (ALEVE) 220 MG tablet as needed.        . Omega-3 Fatty Acids (FISH OIL) 1200 MG CAPS Take 1,000 mg by mouth 2 (two) times daily.       Marland Kitchen omeprazole (PRILOSEC) 40 MG capsule Take 40 mg by mouth daily.        . polyethylene glycol powder (GLYCOLAX/MIRALAX) powder Take by mouth as needed.      . pravastatin (PRAVACHOL) 20 MG tablet Take 1 tablet (20 mg total) by mouth every evening.  30 tablet  6  . senna (SENOKOT) 8.6 MG tablet Take 2 tablets by mouth at bedtime.        . traMADol (ULTRAM) 50 MG tablet Take 50 mg by mouth every 6 (six) hours as needed.           Past Medical History  Diagnosis Date  . PAD (peripheral artery disease)     s/p bilateral comon iliac artery stenting in 2002. Known significant  R SFA  disease  . Aortic stenosis     Mild  . Diabetes mellitus   . Hypertension   . Hyperlipidemia   . Venous insufficiency   . Heart murmur   . CHF (congestive heart failure)     normal EF  . Atrial fib/flutter, transient   . Atrial fibrillation     post op  . DJD (degenerative joint disease) of hip     s/p R THR 10/2010     Past Surgical History  Procedure Date  . Total hip arthroplasty 1991, 1994    redo in 1994  . Lumbar disc surgery 1999  . Cardiac catheterization 01/10/2011    No significant CAD     Family History  Problem Relation Age of Onset  . Diabetes Father   . Diabetes Other     5/8 sibs     History   Social History  . Marital Status: Widowed    Spouse Name: N/A    Number of Children:  N/A  . Years of Education: N/A   Occupational History  .      retired   Social History Main Topics  . Smoking status: Former Smoker -- 1.5 packs/day for 25 years    Types: Cigarettes    Quit date: 08/20/1985  . Smokeless tobacco: Not on file   Comment: Retired-widowed 89, lives with son  . Alcohol Use: Not on file  . Drug Use: No  . Sexually Active: Not on file   Other Topics Concern  . Not on file   Social History Narrative   Lives with sonWidowed 1991Quit smoking in 1987      PHYSICAL EXAM   BP 102/43  Pulse 67  Ht 5\' 4"  (1.626 m)  Wt 149 lb (67.586 kg)  BMI 25.58 kg/m2  SpO2 93%  Constitutional: [name] oriented to person, place, and time. She appears well-developed and well-nourished. No distress.  HENT: No nasal discharge.  Head: Normocephalic and atraumatic.  Eyes: Pupils are equal, round, and reactive to light. Right eye exhibits no discharge. Left eye exhibits no discharge.  Neck: Normal range of motion. Neck supple. No JVD present. No thyromegaly present.  Cardiovascular: Normal rate, regular rhythm, normal heart sounds and diminished distal pulses. Exam reveals no gallop and no friction rub.  There is a 2/6 systolic ejection murmur in the aortic area. Pulmonary/Chest: Effort normal and breath sounds normal. No stridor. No respiratory distress. She has no wheezes. She has no rales. She exhibits no tenderness.  Abdominal: Soft. Bowel sounds are normal. She exhibits no distension. There is no tenderness. There is no rebound and no guarding.  Musculoskeletal: Normal range of motion. She exhibits +1 edema and no tenderness.  Neurological: [name] alert and oriented to person, place, and time. Coordination normal.  Skin: Skin is warm and dry. There is chronic stasis dermatitis. [name] not diaphoretic. No erythema. No pallor.  Psychiatric: She has a normal mood and affect. Her behavior is normal. Judgment and thought content normal.      ASSESSMENT  AND  PLAN

## 2011-01-22 NOTE — Assessment & Plan Note (Signed)
This seems to be improved from before. Her symptoms of neuropathy also improved. Continue current dose of gabapentin.

## 2011-01-22 NOTE — Assessment & Plan Note (Signed)
This was a falsely positive test. Recent cardiac catheterization showed no evidence of obstructive coronary artery disease. It seems that the hemodynamic instability that she had postoperatively was likely will do to atrial fibrillation with rapid ventricular response. The patient seems to be stable at this time. continue current management.

## 2011-01-26 ENCOUNTER — Encounter: Payer: Self-pay | Admitting: Internal Medicine

## 2011-02-07 ENCOUNTER — Other Ambulatory Visit: Payer: Self-pay | Admitting: *Deleted

## 2011-02-07 ENCOUNTER — Ambulatory Visit: Payer: Self-pay | Admitting: Internal Medicine

## 2011-02-07 MED ORDER — FUROSEMIDE 40 MG PO TABS: ORAL_TABLET | ORAL | Status: DC

## 2011-03-01 ENCOUNTER — Encounter: Payer: Self-pay | Admitting: Internal Medicine

## 2011-03-01 DIAGNOSIS — Z0000 Encounter for general adult medical examination without abnormal findings: Secondary | ICD-10-CM | POA: Insufficient documentation

## 2011-03-02 ENCOUNTER — Encounter: Payer: Self-pay | Admitting: Internal Medicine

## 2011-03-02 ENCOUNTER — Ambulatory Visit (INDEPENDENT_AMBULATORY_CARE_PROVIDER_SITE_OTHER): Payer: Medicare Other | Admitting: Internal Medicine

## 2011-03-02 VITALS — BP 112/62 | HR 67 | Temp 97.9°F | Resp 14 | Wt 149.4 lb

## 2011-03-02 DIAGNOSIS — E109 Type 1 diabetes mellitus without complications: Secondary | ICD-10-CM

## 2011-03-02 DIAGNOSIS — I10 Essential (primary) hypertension: Secondary | ICD-10-CM

## 2011-03-02 DIAGNOSIS — R259 Unspecified abnormal involuntary movements: Secondary | ICD-10-CM

## 2011-03-02 DIAGNOSIS — R251 Tremor, unspecified: Secondary | ICD-10-CM

## 2011-03-02 MED ORDER — METOPROLOL TARTRATE 25 MG PO TABS: 50.0000 mg | ORAL_TABLET | Freq: Two times a day (BID) | ORAL | Status: DC

## 2011-03-02 NOTE — Assessment & Plan Note (Signed)
With the incr in metoprolol, will need to d/c the amlodipine, and pt has BP/HR monitor at home, will call with results in one wk, Continue all other medications as before

## 2011-03-02 NOTE — Patient Instructions (Signed)
OK to stop the amlodipine Please increase the metoprolol to 50 mg twice per day Please monitor your Blood Pressure and Pulse twice per day (morning and evening) Please call in 1 wk with the results to Dr Jonny Ruiz or Dr Felicity Coyer Please consider referral to Neurology to assess for Parkinson's if not better with these medication changes Please keep your appointments with your specialists as you have planned  - cardiology in about 3 months, as well as Dr Everardo All and Dr Felicity Coyer as planned Please call or return sooner if your Blood Press is consistently < 110/70, Pulse is < 50, or you have any symptoms such as dizziness, worsening weakness, staggery, shortness of breath

## 2011-03-02 NOTE — Assessment & Plan Note (Addendum)
New onset, UE only right > left , grad worse x 4 mo - for  Now to incr the metoprolol to 50 bid, I offered referral to neuro as her gait is difficult (cant r/o parkinsons)  but she is s/p relatletively recent right hip surgury and she declines at this time, to consider anyway if incr metoprolol not effective, consider primidone

## 2011-03-06 ENCOUNTER — Encounter: Payer: Self-pay | Admitting: Internal Medicine

## 2011-03-06 NOTE — Assessment & Plan Note (Signed)
stable overall by hx and exam, most recent data reviewed with pt, and pt to continue medical treatment as before, doubt symptoms related to low sugars, pt reassured, Lab Results  Component Value Date   HGBA1C 6.6* 01/19/2011

## 2011-03-06 NOTE — Progress Notes (Signed)
Subjective:    Patient ID: Emma Yu, female    DOB: 1927-08-14, 77 y.o.   MRN: [id number]  HPI Here with husband who helps with hx; pt of Dr Felicity Coyer with c/o "shaking" to both arms gradually worse for 3-4 months, worse on the right than left.  Has ongoing right hip pain s/p surgury so gait difficult to assess, walks with walker but no falls or other overt neuro symptoms.  Pt denies chest pain, increased sob or doe, wheezing, orthopnea, PND, increased LE swelling, palpitations, dizziness or syncope.  Pt denies new neurological symptoms such as new headache, or facial or extremity weakness or numbness    Pt denies polydipsia, polyuria, or low sugar symptoms such as weakness or confusion improved with po intake.  Pt states overall good compliance with meds, trying to follow lower cholesterol, diabetic diet, wt overall stable but little exercise however.    Pt denies fever, wt loss, night sweats, loss of appetite, or other constitutional symptoms  Past Medical History  Diagnosis Date  . PAD (peripheral artery disease)     s/p bilateral comon iliac artery stenting in 2002. Known significant  R SFA  disease  . Aortic stenosis     Mild  . Diabetes mellitus   . Hypertension   . Hyperlipidemia   . Venous insufficiency   . Heart murmur   . CHF (congestive heart failure)     normal EF  . Atrial fib/flutter, transient   . Atrial fibrillation     post op  . DJD (degenerative joint disease) of hip     s/p R THR 10/2010   Past Surgical History  Procedure Date  . Total hip arthroplasty 1991, 1994    redo in 1994  . Lumbar disc surgery 1999  . Cardiac catheterization 01/10/2011    No significant CAD    reports that she quit smoking about 25 years ago. Her smoking use included Cigarettes. She has a 37.5 pack-year smoking history. She does not have any smokeless tobacco history on file. She reports that she does not use illicit drugs. Her alcohol history not on file. family history includes  Diabetes in her father and other. Allergies  Allergen Reactions  . Carvedilol Other (See Comments)    Heart stops  . Diltiazem Hcl   . Fenofibrate   . Hctz (Hydrochlorothiazide)   . Nisoldipine   . Sular   . Tekturna (Aliskiren Fumarate)   . Valturna (Aliskiren-Valsartan)    Current Outpatient Prescriptions on File Prior to Visit  Medication Sig Dispense Refill  . acetaminophen (TYLENOL) 325 MG tablet As needed       . Ascorbic Acid (VITAMIN C) 500 MG tablet Take 500 mg by mouth daily.        Marland Kitchen aspirin 81 MG tablet Take 2 at bedtime       . Cholecalciferol (VITAMIN D3) 2000 UNITS capsule Take 2,000 Units by mouth daily.        . cilostazol (PLETAL) 50 MG tablet Take 1 tablet (50 mg total) by mouth 2 (two) times daily.  60 tablet  6  . Cinnamon 500 MG capsule 4 capsules daily       . cloNIDine (CATAPRES) 0.2 MG tablet Take 0.2 mg by mouth 4 (four) times daily.        . Coenzyme Q10 10 MG capsule (300mg  caps) 1 capsule by mouth daily       . furosemide (LASIX) 40 MG tablet Take 2 tablets in the morning  and 1 tablet at 2 PM  90 tablet  3  . gabapentin (NEURONTIN) 300 MG capsule Take 2 capsules (600 mg total) by mouth at bedtime.  60 capsule  6  . insulin lispro (HUMALOG) 100 UNIT/ML injection continuous pump      . losartan (COZAAR) 100 MG tablet Take 100 mg by mouth daily.        . methocarbamol (ROBAXIN) 500 MG tablet Take 500 mg by mouth 3 (three) times daily as needed. 1 tablet tid as needed for cramps       . Multiple Vitamins-Minerals (OCUVITE ADULT 50+ PO) Take 2 each by mouth daily.        . Omega-3 Fatty Acids (FISH OIL) 1200 MG CAPS Take 1,000 mg by mouth 2 (two) times daily.       Marland Kitchen omeprazole (PRILOSEC) 40 MG capsule Take 40 mg by mouth daily.        . polyethylene glycol powder (GLYCOLAX/MIRALAX) powder Take by mouth as needed.      . pravastatin (PRAVACHOL) 20 MG tablet Take 1 tablet (20 mg total) by mouth every evening.  30 tablet  6  . senna (SENOKOT) 8.6 MG tablet Take  2 tablets by mouth at bedtime.        . traMADol (ULTRAM) 50 MG tablet Take 50 mg by mouth every 6 (six) hours as needed.        . naproxen sodium (ALEVE) 220 MG tablet as needed.         Review of Systems Review of Systems  Constitutional: Negative for diaphoresis and unexpected weight change.  HENT: Negative for drooling and tinnitus.   Eyes: Negative for photophobia and visual disturbance.  Respiratory: Negative for choking and stridor.   Gastrointestinal: Negative for vomiting and blood in stool.  Genitourinary: Negative for hematuria and decreased urine volume.  Musculoskeletal: Negative for gait problem.  Skin: Negative for color change and wound.  Neurological: Negative for tremors and numbness.  Psychiatric/Behavioral: Negative for decreased concentration. The patient is not hyperactive.       Objective:   Physical Exam BP 112/62  Pulse 67  Temp(Src) 97.9 F (36.6 C) (Oral)  Resp 14  Wt 149 lb 6 oz (67.756 kg)  SpO2 92% Physical Exam  VS noted Constitutional: Pt appears well-developed and well-nourished.  HENT: Head: Normocephalic.  Right Ear: External ear normal.  Left Ear: External ear normal.  Eyes: Conjunctivae and EOM are normal. Pupils are equal, round, and reactive to light.  Neck: Normal range of motion. Neck supple.  Cardiovascular: Normal rate and regular rhythm.   Pulmonary/Chest: Effort normal and breath sounds normal.  Abd:  Soft, NT, non-distended, + BS Neurological: Pt is alert. No cranial nerve deficit.  frail and weak in general , gross motor intact but has bilat resting tremors noted to UE's only right > left , overall mild to mod Skin: Skin is warm. No erythema.  Psychiatric: Pt behavior is normal. Thought content normal.         Assessment & Plan:

## 2011-03-14 ENCOUNTER — Other Ambulatory Visit: Payer: Self-pay

## 2011-03-14 MED ORDER — METOPROLOL TARTRATE 25 MG PO TABS: 50.0000 mg | ORAL_TABLET | Freq: Two times a day (BID) | ORAL | Status: DC

## 2011-04-13 ENCOUNTER — Encounter: Payer: Self-pay | Admitting: Cardiovascular Disease

## 2011-04-19 ENCOUNTER — Encounter: Payer: Self-pay | Admitting: Endocrinology

## 2011-04-19 ENCOUNTER — Other Ambulatory Visit (INDEPENDENT_AMBULATORY_CARE_PROVIDER_SITE_OTHER): Payer: Medicare Other

## 2011-04-19 ENCOUNTER — Encounter: Payer: Self-pay | Admitting: Internal Medicine

## 2011-04-19 ENCOUNTER — Ambulatory Visit (INDEPENDENT_AMBULATORY_CARE_PROVIDER_SITE_OTHER): Payer: Medicare Other | Admitting: Internal Medicine

## 2011-04-19 ENCOUNTER — Ambulatory Visit (INDEPENDENT_AMBULATORY_CARE_PROVIDER_SITE_OTHER): Payer: Medicare Other | Admitting: Endocrinology

## 2011-04-19 VITALS — BP 138/72 | HR 54 | Temp 98.1°F | Ht 60.0 in | Wt 147.8 lb

## 2011-04-19 VITALS — BP 138/72 | HR 54 | Temp 98.1°F | Ht 60.0 in | Wt 147.0 lb

## 2011-04-19 DIAGNOSIS — R251 Tremor, unspecified: Secondary | ICD-10-CM

## 2011-04-19 DIAGNOSIS — R5381 Other malaise: Secondary | ICD-10-CM

## 2011-04-19 DIAGNOSIS — R5383 Other fatigue: Secondary | ICD-10-CM

## 2011-04-19 DIAGNOSIS — E109 Type 1 diabetes mellitus without complications: Secondary | ICD-10-CM

## 2011-04-19 DIAGNOSIS — E7800 Pure hypercholesterolemia, unspecified: Secondary | ICD-10-CM

## 2011-04-19 DIAGNOSIS — R259 Unspecified abnormal involuntary movements: Secondary | ICD-10-CM

## 2011-04-19 DIAGNOSIS — I10 Essential (primary) hypertension: Secondary | ICD-10-CM

## 2011-04-19 LAB — CBC WITH DIFFERENTIAL/PLATELET
Basophils Relative: 0.5 % (ref 0.0–3.0)
Eosinophils Absolute: 0.2 10*3/uL (ref 0.0–0.7)
Eosinophils Relative: 3.2 % (ref 0.0–5.0)
Hemoglobin: 13 g/dL (ref 12.0–15.0)
Lymphocytes Relative: 37.5 % (ref 12.0–46.0)
Monocytes Relative: 9.5 % (ref 3.0–12.0)
Neutro Abs: 3.5 10*3/uL (ref 1.4–7.7)
Neutrophils Relative %: 49.3 % (ref 43.0–77.0)
RBC: 4.2 Mil/uL (ref 3.87–5.11)
WBC: 7.1 10*3/uL (ref 4.5–10.5)

## 2011-04-19 LAB — HEPATIC FUNCTION PANEL
ALT: 19 U/L (ref 0–35)
AST: 22 U/L (ref 0–37)
Bilirubin, Direct: 0 mg/dL (ref 0.0–0.3)
Total Bilirubin: 0.6 mg/dL (ref 0.3–1.2)
Total Protein: 7.8 g/dL (ref 6.0–8.3)

## 2011-04-19 LAB — TSH: TSH: 3.91 u[IU]/mL (ref 0.35–5.50)

## 2011-04-19 LAB — LIPID PANEL
LDL Cholesterol: 38 mg/dL (ref 0–99)
Total CHOL/HDL Ratio: 2
Triglycerides: 134 mg/dL (ref 0.0–149.0)

## 2011-04-19 MED ORDER — LOSARTAN POTASSIUM 100 MG PO TABS: 100.0000 mg | ORAL_TABLET | Freq: Every day | ORAL | Status: DC

## 2011-04-19 MED ORDER — INSULIN LISPRO 100 UNIT/ML ~~LOC~~ SOLN: SUBCUTANEOUS | Status: DC

## 2011-04-19 MED ORDER — CLONIDINE HCL 0.2 MG PO TABS: 0.2000 mg | ORAL_TABLET | Freq: Four times a day (QID) | ORAL | Status: DC

## 2011-04-19 MED ORDER — TRAMADOL HCL 50 MG PO TABS: 50.0000 mg | ORAL_TABLET | Freq: Four times a day (QID) | ORAL | Status: DC | PRN

## 2011-04-19 MED ORDER — METHOCARBAMOL 500 MG PO TABS: 500.0000 mg | ORAL_TABLET | Freq: Three times a day (TID) | ORAL | Status: DC | PRN

## 2011-04-19 NOTE — Patient Instructions (Addendum)
blood tests are being requested for you today.  please call [phone] to hear your test results.  You will be prompted to enter the 9-digit "MRN" number that appears at the top left of this page, followed by #.  Then you will hear the message. pending the test results, please continue basal rate of 0.9 unit/hr, 24 hrs per day.   continue bolus of 16 units with each meal.   continue correction bolus (which some people call "sensitivity," or "insulin sensitivity ratio," or just "isr") of 1 unit for each by 25 which your glucose exceeds 120. check your blood sugar 4 times a day--before the 3 meals, and at bedtime.  also check if you have symptoms of your blood sugar being too high or too low.  please keep a record of the readings and bring it to your next appointment here.  please call us sooner if you are having low blood sugar episodes. good diet and exercise habits significanly improve the control of your diabetes.  please let me know if you wish to be referred to a dietician.  high blood sugar is very risky to your health.  you should see an eye doctor every year. controlling your blood pressure and cholesterol drastically reduces the damage diabetes does to your body.  this also applies to quitting smoking.  please discuss these with your doctor.  you should take an aspirin every day, unless you have been advised by a doctor not to. Please make a follow-up appointment in 3 months.   (update: i left message on phone-tree:  Do not exceed rx'ed dosage)

## 2011-04-19 NOTE — Assessment & Plan Note (Signed)
On max prava - recheck lipids/lfts today

## 2011-04-19 NOTE — Progress Notes (Signed)
Subjective:    Patient ID: Emma Yu, female    DOB: 06-09-1927, 77 y.o.   MRN: [id number]  HPI  Here for follow up - reviewed chronic medical issues today:  DM2 - increase cbgs on pletal per pt - so stopped pletal- reports compliance with ongoing medical treatment and no changes in medication dose or frequency. denies adverse side effects related to current therapy. denies hypoglycemia symptoms - follows with endo for same  dyslipidemia - reports compliance with ongoing medical treatment and no changes in medication dose or frequency. denies adverse side effects related to current therapy. no GI or muscle SE or c/o  HTN - reports compliance with ongoing medical treatment - reviewed changes in medication due to tremor 02/2011. denies adverse side effects related to current therapy. no headache or chest pain -  chronic LBP - improved after R THR 10/2010 but not resolved - on tramadol instead of vicodin  PVD/PAD - no longer on pletal - follows with cards for same - continued burning pain in legs despite increase gabapentin - leg pain associated with redness and tight swelling BLE (less lasix than prior to hosp)  Past Medical History  Diagnosis Date  . PAD (peripheral artery disease)     s/p bilateral comon iliac artery stenting in 2002. Known significant  R SFA  disease  . Aortic stenosis     Mild  . Diabetes mellitus   . Hypertension   . Hyperlipidemia   . Venous insufficiency   . Heart murmur   . CHF (congestive heart failure)     normal EF  . Atrial fib/flutter, transient   . Atrial fibrillation     post op  . DJD (degenerative joint disease) of hip     s/p R THR 10/2010     Review of Systems  Constitutional: Negative for fever.  Respiratory: Negative for shortness of breath.   Cardiovascular: Negative for palpitations.  Neurological: Negative for headaches.       Objective:   Physical Exam  BP 138/72  Pulse 54  Temp(Src) 98.1 F (36.7 C) (Oral)  Ht 5'  (1.524 m)  Wt 147 lb (66.679 kg)  BMI 28.71 kg/m2  SpO2 94% Constitutional: [name] oriented to person, place, and time. She appears well-developed and well-nourished. No distress. Friend at side Neck: Normal range of motion. Neck supple. No JVD present. No thyromegaly present.  Cardiovascular: Normal rate, regular rhythm and normal heart sounds.  4/6 holosyst murmur heard without change. tight 1+ BLE edema with venous insuff and blistering/erythema distal LE. Pulmonary/Chest: Effort normal and breath sounds normal. No respiratory distress. She has no wheezes.  Neurological: intention tremor BUE with occasional myoclonic spell - minimal neck/head neck tremor - alert and oriented to person, place, and time. No cranial nerve deficit. Coordination normal.  Skin: Chronic venous insuff changes of distal BLE, L>R. No rash noted. No erythema. +weeping Psychiatric: She has a normal mood and affect. Her behavior is normal. Judgment and thought content normal.       Lab Results  Component Value Date   WBC 6.3 01/10/2011   HGB 12.2 01/10/2011   HCT 37.4 01/10/2011   PLT 180 01/10/2011   CHOL 156 02/03/2010   TRIG 188.0* 02/03/2010   HDL 47.00 02/03/2010   ALT 41* 11/13/2010   AST 30 11/13/2010   NA 140 01/10/2011   K 4.2 01/10/2011   CL 101 01/10/2011   CREATININE 0.81 01/10/2011   BUN 25* 01/10/2011   CO2  31 01/10/2011   TSH 1.484 10/30/2010   INR 1.01 01/10/2011   HGBA1C 6.6* 01/19/2011    Assessment & Plan:  See problem list. Medications and labs reviewed today.  Tremor - not improved with increase beta-blocker dose, check labs and will refer to neuro as suggested 02/2011  Fatigue/hypersomnia - ?related to tremor or meds - check labs - no med change rec at this point

## 2011-04-19 NOTE — Assessment & Plan Note (Signed)
The current medical regimen is effective;  continue present plan and medications.  BP Readings from Last 3 Encounters:  04/19/11 138/72  04/19/11 138/72  03/02/11 112/62

## 2011-04-19 NOTE — Patient Instructions (Signed)
It was good to see you today. We have reviewed your prior records including labs and tests today All medications reviewed, no changes at this time. Refill on medication(s) as discussed today. we'll make referral to neurology for your tremor. Our office will contact you regarding appointment(s) once made. Test(s) ordered today. Your results will be called to you after review (48-72hours after test completion). If any changes need to be made, you will be notified at that time. Please schedule followup in 3 months, call sooner if problems.

## 2011-04-19 NOTE — Progress Notes (Signed)
Subjective:    Patient ID: Emma Yu, female    DOB: 10-26-1926, 77 y.o.   MRN: [id number]  HPI pt states she feels well in general.  she brings a record of her cbg's which i have reviewed today.  It varies from 86-150.  There is no trend throughout the day.  She stopped the pletal, saying it increased her cbg's.   Pt says she sometimes exceeds the prescribed bolus.  Past Medical History  Diagnosis Date  . PAD (peripheral artery disease)     s/p bilateral comon iliac artery stenting in 2002. Known significant  R SFA  disease  . Aortic stenosis     Mild  . Diabetes mellitus   . Hypertension   . Hyperlipidemia   . Venous insufficiency   . Heart murmur   . CHF (congestive heart failure)     normal EF  . Atrial fib/flutter, transient   . Atrial fibrillation     post op  . DJD (degenerative joint disease) of hip     s/p R THR 10/2010    Past Surgical History  Procedure Date  . Total hip arthroplasty 1991, 1994    redo in 1994  . Lumbar disc surgery 1999  . Cardiac catheterization 01/10/2011    No significant CAD    History   Social History  . Marital Status: Widowed    Spouse Name: N/A    Number of Children: N/A  . Years of Education: N/A   Occupational History  .      retired   Social History Main Topics  . Smoking status: Former Smoker -- 1.5 packs/day for 25 years    Types: Cigarettes    Quit date: 08/20/1985  . Smokeless tobacco: Not on file   Comment: Retired-widowed 89, lives with son  . Alcohol Use: Not on file  . Drug Use: No  . Sexually Active: Not on file   Other Topics Concern  . Not on file   Social History Narrative   Lives with sonWidowed 1991Quit smoking in 1987    Current Outpatient Prescriptions on File Prior to Visit  Medication Sig Dispense Refill  . acetaminophen (TYLENOL) 325 MG tablet As needed       . Ascorbic Acid (VITAMIN C) 500 MG tablet Take 500 mg by mouth daily.        Marland Kitchen aspirin 81 MG tablet Take 2 at bedtime         . b complex vitamins tablet Take 1 tablet by mouth daily.        . Cholecalciferol (VITAMIN D3) 2000 UNITS capsule Take 2,000 Units by mouth daily.        . Cinnamon 500 MG capsule 4 capsules daily       . Coenzyme Q10 10 MG capsule (300mg  caps) 1 capsule by mouth daily       . furosemide (LASIX) 40 MG tablet Take 2 tablets in the morning and 1 tablet at 2 PM  90 tablet  3  . gabapentin (NEURONTIN) 300 MG capsule Take 2 capsules (600 mg total) by mouth at bedtime.  60 capsule  6  . insulin lispro (HUMALOG) 100 UNIT/ML injection use as directed for insulin pump--total of 80 units/day  80 mL  12  . metoprolol tartrate (LOPRESSOR) 25 MG tablet Take 2 tablets (50 mg total) by mouth 2 (two) times daily.  120 tablet  11  . naproxen sodium (ALEVE) 220 MG tablet as needed.        Marland Kitchen  Omega-3 Fatty Acids (FISH OIL) 1200 MG CAPS Take 1,000 mg by mouth 2 (two) times daily.       Marland Kitchen omeprazole (PRILOSEC) 40 MG capsule Take 40 mg by mouth daily.        . polyethylene glycol powder (GLYCOLAX/MIRALAX) powder Take by mouth as needed.      . pravastatin (PRAVACHOL) 20 MG tablet Take 1 tablet (20 mg total) by mouth every evening.  30 tablet  6  . senna (SENOKOT) 8.6 MG tablet Take 2 tablets by mouth at bedtime.        . cilostazol (PLETAL) 50 MG tablet Take 1 tablet (50 mg total) by mouth 2 (two) times daily.  60 tablet  6  . cloNIDine (CATAPRES) 0.2 MG tablet Take 1 tablet (0.2 mg total) by mouth 4 (four) times daily.  120 tablet  11  . losartan (COZAAR) 100 MG tablet Take 1 tablet (100 mg total) by mouth daily.  30 tablet  11  . methocarbamol (ROBAXIN) 500 MG tablet Take 1 tablet (500 mg total) by mouth 3 (three) times daily as needed.  90 tablet  1  . traMADol (ULTRAM) 50 MG tablet Take 1 tablet (50 mg total) by mouth every 6 (six) hours as needed.  60 tablet  6    Allergies  Allergen Reactions  . Carvedilol Other (See Comments)    Heart stops  . Diltiazem Hcl   . Fenofibrate   . Hctz  (Hydrochlorothiazide)   . Nisoldipine   . Sular   . Tekturna (Aliskiren Fumarate)   . Valturna (Aliskiren-Valsartan)     Family History  Problem Relation Age of Onset  . Diabetes Father   . Diabetes Other     5/8 sibs    BP 138/72  Pulse 54  Temp(Src) 98.1 F (36.7 C) (Oral)  Ht 5' (1.524 m)  Wt 147 lb 12.8 oz (67.042 kg)  BMI 28.87 kg/m2  SpO2 94%  Review of Systems denies hypoglycemia.    Objective:   Physical Exam Pulses: dorsalis pedis are absent bilat.   Feet: no deformity.  no ulcer on the feet.  feet are of normal color and temp.  no edema.  There is mild erythema of the legs.  There are bilat varicosities.  Marland Kitchenpnych Neuro: sensation is intact to touch on the feet.  Lab Results  Component Value Date   HGBA1C 6.6* 04/19/2011      Assessment & Plan:  Dm.  overcontrolled due to noncompliance.  i'll do the best i can.

## 2011-04-20 ENCOUNTER — Ambulatory Visit: Payer: Medicare Other | Admitting: Endocrinology

## 2011-04-24 ENCOUNTER — Encounter: Payer: Self-pay | Admitting: Neurology

## 2011-05-03 ENCOUNTER — Encounter: Payer: Self-pay | Admitting: Neurology

## 2011-05-03 ENCOUNTER — Ambulatory Visit (INDEPENDENT_AMBULATORY_CARE_PROVIDER_SITE_OTHER): Payer: Medicare Other | Admitting: Neurology

## 2011-05-03 VITALS — BP 120/72 | HR 60 | Ht 61.0 in | Wt 147.0 lb

## 2011-05-03 DIAGNOSIS — G250 Essential tremor: Secondary | ICD-10-CM

## 2011-05-03 NOTE — Progress Notes (Signed)
Dear Dr. Felicity Coyer,  Thank you for having me see [name] in consultation today for her problems with tremor.  As you may recall she is an 77 year old woman with a significant history of diabetes, HTN, CAD, PVD who presents with a tremor of her hands.  She says it is worse on the right hand side.  She says that it has been only present since her "heart attack" in March 2012, but review of the notes indicate it has been there for much longer.  She says that she can't eat with the tremor at times, in particular soups.  She doesn't hold a book when she reads so it does not interfere with this.  She has problems writing with the tremor.  She does have it in her head, according to a daughter, but doesn't notice it in her voice.  She does not drink EtOH so can't say whether it is better with that.  She has not been tried on a medication for the tremor.  PMHx: 1.  Diabetes 2.  HTN, 3.  CAD 4.  Peripheral neuropathy 5. PVD 6.  Kidney stones.  SurgHx: Hysterectomy, cholecystectomy, colon resection  FamHx: - father had head tremor  Meds: Include aspirin, clonidine, furosemide, gabapentin 600 qhs, Tramadol 50mg , metoprolol 50mg , losartan, insluin   Allergies  Allergen Reactions  . Carvedilol Other (See Comments)    Heart stops  . Diltiazem Hcl   . Fenofibrate   . Hctz (Hydrochlorothiazide)   . Nisoldipine   . Sular   . Tekturna (Aliskiren Fumarate)   . Valturna (Aliskiren-Valsartan)    Examination: Filed Vitals:   05/03/11 1018  BP: 120/72  Pulse: 60  Height: 5\' 1"  (1.549 m)  Weight: 147 lb (66.679 kg)   Gen:  Frail appearing older woman in NAD, who uses a walker  Cardiovascular: The patient has a regular rate and rhythm.  Fundoscopy:  Disks are flat. Arterial caliber dimunitiive  Mental status:   The patient is oriented to person, place and time. Recent and remote memory are intact. Attention span and concentration are normal. Language including repetition, naming,  following commands are intact. Fund of knowledge of current and historical events, as well as vocabulary are normal.  Does seem to have a somewhat blunted affect/masked facies.  Cranial Nerves: Pupils are equally round and reactive to light. Visual fields full to confrontation. Extraocular movements are intact without nystagmus with saccadic intrusion but good range of motion. Facial sensation and muscles of mastication are intact. Muscles of facial expression are symmetric. Hearing intact to bilateral finger rub but decreased on the left when compared to the right. Tongue protrusion, uvula, palate midline.  Shoulder shrug intact.  +voice tremor, mild to and fro head tremor  Motor:  The patient has normal bulk.  Slight rigidity on the left with contralateral exacerbation maneuvers.  No pronator drift and 5/5 strength bilaterally.  No rest tremor, + coarse postural tremor, with little action component.  Decreased amplitude of finger tapping.  Reflexes:  Are absent throughout.    Coordination:  Finger to nose reveals terminal tremor.  Sensation is decreased to vib and temperature.  Gait and Station are impaired, appears antalgic as well as slow.  Uses walker. Romberg is negative.  Impression:  77 year old vasculopath with mainly postural tremor that is likely essential.  Also has diabetic peripheral neuropathy that is symptomatic and likely causing sone unsteadiness.  There are some parkinsonian features here, but I am not convinced that  that is the diagnosis here.   Head tremor is unusual in a Parkinson's patient.  Recommendations 1.  Essential tremor - Management is difficult in her case.  I would like to ask your opinion of switching her from metoprolol to a non-specific beta blocker such as propranolol which can be effective for tremor.  We will also want to seek the advice of Dr. Charlton Haws who she is due to see.  If this is an option this can be increased as high is tolerated, or until her  tremor improves.  The other options are clonazepam and primidone.  I am a bit concerned about using this due to her age, but if the beta blocker change is not an option or does not work I would start with 0.25mg  clonazepam in the am to see if this helps her tremor and she is not too tired from it.  Another option is Topamax, but she has had a history of kidney stones which can be exacerbated by TPM.  Of course if a medication does not work, DBS is an option.  2.  Peripheral neuropathy - like contributing to her gait instability.  Likely due to DM.  We will see her back in 3 months.  In the meantime, I will wait for your feedback on changing the beta blocker as well as Dr. Fabio Bering.  Once I have that info, I would let either of you titrate propranolol, or if it is not an option I will start her on clonazepam.  Thank you for having [place] see this patient in consultation.  Feel free to contact me with any questions.  Lupita Raider Modesto Charon, MD [facility], [place] [address] [place], [place] [address] Phone: [phone] Fax: [phone].

## 2011-05-04 ENCOUNTER — Encounter: Payer: Self-pay | Admitting: Cardiovascular Disease

## 2011-05-24 ENCOUNTER — Ambulatory Visit (INDEPENDENT_AMBULATORY_CARE_PROVIDER_SITE_OTHER): Payer: Medicare Other | Admitting: Cardiovascular Disease

## 2011-05-24 ENCOUNTER — Encounter: Payer: Self-pay | Admitting: Cardiovascular Disease

## 2011-05-24 VITALS — BP 156/56 | HR 59 | Ht 64.0 in | Wt 148.8 lb

## 2011-05-24 DIAGNOSIS — R0989 Other specified symptoms and signs involving the circulatory and respiratory systems: Secondary | ICD-10-CM | POA: Insufficient documentation

## 2011-05-24 DIAGNOSIS — E109 Type 1 diabetes mellitus without complications: Secondary | ICD-10-CM

## 2011-05-24 DIAGNOSIS — I10 Essential (primary) hypertension: Secondary | ICD-10-CM

## 2011-05-24 DIAGNOSIS — I872 Venous insufficiency (chronic) (peripheral): Secondary | ICD-10-CM

## 2011-05-24 DIAGNOSIS — R011 Cardiac murmur, unspecified: Secondary | ICD-10-CM

## 2011-05-24 DIAGNOSIS — I739 Peripheral vascular disease, unspecified: Secondary | ICD-10-CM

## 2011-05-24 DIAGNOSIS — E7800 Pure hypercholesterolemia, unspecified: Secondary | ICD-10-CM

## 2011-05-24 NOTE — Assessment & Plan Note (Signed)
Vasculopath.  Check carotid duplex next visit.  Dont see recent documentation of this  ASA

## 2011-05-24 NOTE — Patient Instructions (Signed)
Your physician wants you to follow-up in: 6 MONTHS WITH DR Eden Emms CAROTID SAME DAY You will receive a reminder letter in the mail two months in advance. If you don't receive a letter, please call our office to schedule the follow-up appointment. Your physician recommends that you continue on your current medications as directed. Please refer to the Current Medication list given to you today. Your physician has requested that you have a carotid duplex. This test is an ultrasound of the carotid arteries in your neck. It looks at blood flow through these arteries that supply the brain with blood. Allow one hour for this exam. There are no restrictions or special instructions.  IN  6 MONTHS SEE DR Eden Emms SAME DAY DX [place]

## 2011-05-24 NOTE — Assessment & Plan Note (Signed)
Continue current dose of lasix.  Elevate legs at end of day

## 2011-05-24 NOTE — Progress Notes (Signed)
This is an 77 year old female who has been followed by [place]  Here today for a followup visit. C ardiac catheterization 01/11/11 which overall showed no significant coronary artery disease. [name] doing reasonably well. The dose of Lasix was increased recently do to increased leg edema. I also recently decreased the dose of amlodipine to 2.5 mg once daily for the same reason. The dose of Neurontin was increased  Reviewed BP records from home and acceptable in the 130-145 systolic range.  On 5 meds with calcified senile medial vessel disease.  Diabetic with insulin pump followed by [facility].  New sliding scale recently.  Legs hurt.  Multifactorial.  No documented PVD. Stopped Pletal due to running BS higher.  Clear neuropathy.  Ambulation limited by back pain.  LE edema improved.  No nonhealing ulcers.  Chronic venous insuf. Changes. New UE tremors.  History of tremors in family.  Being evaluated by Modesto Charon for Parkinsons.  No SSCP, palpitations, dyspnea or syncope   ROS: Denies fever, malais, weight loss, blurry vision, decreased visual acuity, cough, sputum, SOB, hemoptysis, pleuritic pain, palpitaitons, heartburn, abdominal pain, melena, lower extremity edema, claudication, or rash.  All other systems reviewed and negative  General: Affect appropriate Chronically ill :  appears stated age HEENT: normal Neck supple with no adenopathy JVP normal bilateral bruits no thyromegaly Lungs clear with no wheezing and good diaphragmatic motion Heart:  S1/S2 midl AS  Murmur, no rub, gallop or click PMI normal Abdomen: benighn, BS positve, no tenderness, no AAA no bruit.  No HSM or HJR Distal pulses poor below knee  with no bruits Plus one  edema Neuro non-focal Skin warm and dry No muscular weakness   Current Outpatient Prescriptions  Medication Sig Dispense Refill  . acetaminophen (TYLENOL) 325 MG tablet As needed       . Ascorbic Acid (VITAMIN C) 500 MG tablet Take 500 mg by mouth daily.        Marland Kitchen  aspirin 81 MG tablet Take 2 at bedtime       . b complex vitamins tablet Take 1 tablet by mouth daily.        . Cholecalciferol (VITAMIN D3) 2000 UNITS capsule Take 2,000 Units by mouth daily.        . Cinnamon 500 MG capsule 4 capsules daily       . cloNIDine (CATAPRES) 0.2 MG tablet Take 1 tablet (0.2 mg total) by mouth 4 (four) times daily.  120 tablet  11  . Coenzyme Q10 10 MG capsule (300mg  caps) 1 capsule by mouth daily       . furosemide (LASIX) 40 MG tablet Take 2 tablets in the morning and 1 tablet at 2 PM  90 tablet  3  . gabapentin (NEURONTIN) 300 MG capsule Take 2 capsules (600 mg total) by mouth at bedtime.  60 capsule  6  . insulin lispro (HUMALOG) 100 UNIT/ML injection use as directed for insulin pump--total of 80 units/day  80 mL  12  . losartan (COZAAR) 100 MG tablet Take 1 tablet (100 mg total) by mouth daily.  30 tablet  11  . methocarbamol (ROBAXIN) 500 MG tablet Take 1 tablet (500 mg total) by mouth 3 (three) times daily as needed.  90 tablet  1  . metoprolol tartrate (LOPRESSOR) 25 MG tablet Take 2 tablets (50 mg total) by mouth 2 (two) times daily.  120 tablet  11  . naproxen sodium (ALEVE) 220 MG tablet as needed.        Marland Kitchen  Omega-3 Fatty Acids (FISH OIL) 1200 MG CAPS Take 1,000 mg by mouth 2 (two) times daily.       Marland Kitchen omeprazole (PRILOSEC) 40 MG capsule Take 40 mg by mouth daily.        . pravastatin (PRAVACHOL) 20 MG tablet Take 1 tablet (20 mg total) by mouth every evening.  30 tablet  6  . traMADol (ULTRAM) 50 MG tablet Take 1 tablet (50 mg total) by mouth every 6 (six) hours as needed.  60 tablet  6    Allergies  Carvedilol; Diltiazem hcl; Fenofibrate; Hctz; Nisoldipine; Sular; Danie Chandler; and Valturna  Electrocardiogram:  Assessment and Plan

## 2011-05-24 NOTE — Assessment & Plan Note (Signed)
Cholesterol is at goal.  Continue current dose of statin and diet Rx.  No myalgias or side effects.  F/U  LFT's in 6 months. Lab Results  Component Value Date   LDLCALC 38 04/19/2011

## 2011-05-24 NOTE — Assessment & Plan Note (Signed)
Mild AS on exam  Consider echo if she gets dyspnea or SSCP.

## 2011-05-24 NOTE — Assessment & Plan Note (Signed)
Pletal stopped.  Pain in legs primarily neuropathic and ambulation limited by back pain.  No reason to check ABI;s pulses ok above knees.  Primarily small vessel disease from DM

## 2011-05-24 NOTE — Assessment & Plan Note (Signed)
BP acceptable Continue current meds.  Low sodium diet

## 2011-05-24 NOTE — Assessment & Plan Note (Signed)
Conintue insulin pump.  Target A1c 6.5 or less

## 2011-06-11 ENCOUNTER — Other Ambulatory Visit: Payer: Self-pay

## 2011-06-11 MED ORDER — PROPRANOLOL HCL 60 MG PO CP24: 60.0000 mg | ORAL_CAPSULE | Freq: Every day | ORAL | Status: DC

## 2011-06-11 NOTE — Telephone Encounter (Signed)
Pt notified of med change from metoprolol to propanolol and to call us after taking for 1-2 weeks if tremors no better.  Called to [facility] at [phone].

## 2011-06-14 ENCOUNTER — Telehealth: Payer: Self-pay | Admitting: Neurology

## 2011-06-14 NOTE — Telephone Encounter (Signed)
Took Rx with breakfast and it upset her stomach a lot. Blood pressure also seems to be elevated.

## 2011-06-14 NOTE — Telephone Encounter (Signed)
Pt started taking the propanolol Tuesday with her lasix and she was urinating a lot, more than she normally does with just the lasix,  She also noticed her bp was more elevated than normal 213/66 yesterday, then 193/53 this morning then 163/48 at lunch today.  She does not take her bp meds til bed time. Except clonidine is qid.  It is helping her tremors though.  She did not take the lasix yesterday morning or today.

## 2011-06-14 NOTE — Telephone Encounter (Signed)
Spoke to patient.  Started propranolol 60mg  LA instead of metoprolol.  Had elevated blood pressures but this aft was 170s/50s - HR high 50s to low 60s.  Tremor is much better.  Had her read out old blood pressure and found that she always runs high even on the metop, typically SBP in the 150s-160s.  I said for her to continue to check BP if, she continues to have 190s-210s then we will need to talk to Dr. Eden Emms about whether we should increase her propranolol or whether he wants to alter her bp regimen by increasing one of her other drugs.  She also asked me if she can stop taking the lasix because it made her pee too much, but I said she had to ask Dr. Eden Emms this.

## 2011-06-20 ENCOUNTER — Telehealth: Payer: Self-pay | Admitting: Cardiovascular Disease

## 2011-06-20 NOTE — Telephone Encounter (Signed)
Pt medications is not keeping b/p down and she needs to talk about an adjustment

## 2011-06-21 NOTE — Telephone Encounter (Signed)
Will forward for dr nishan review Emma Yu  

## 2011-06-21 NOTE — Telephone Encounter (Signed)
Pt calling wanting to report BP readings:   10/28 204/58  HR 54--breakfast          174/50  HR 59--lunch  10/29 221/69 HR 56--breakfast           202/60  HR 56--lunch  208/57 HR 58--dinner  10/30 184/57 HR 62--breakfast  195/55 HR 59--lunch  189/55 HR 60--dinner   10/31 215/64 HR 59--breakfast  190/59 HR 59--lunch  185/55 HR 62-dinner  11/1 194/60 HR 60--breakfast  Pt said Dr. Modesto Charon put pt on propranolol HCL-ER 60 mg, pt said she can not take this medication with lasix bc it gives pt diarrhea.   Please return pt call to discuss further.

## 2011-06-22 NOTE — Telephone Encounter (Signed)
If she is not taking lasix try demedex 20mg  and add norvasc 10 mg.  F/U BMET 4 weeks. Limit use of NSAI's

## 2011-06-25 NOTE — Telephone Encounter (Signed)
Pt rtn call from someone that called her about 10am and her phone is not working properly and her answering machine will not pick up until the phone rings about 5 times I asked pt to keep an eye on her phone and if she doesn't answer you will call back

## 2011-06-25 NOTE — Telephone Encounter (Signed)
Spoke with pt, she had stopped the propranolol due to diarrhea. On sat she restart the metoprolol tart 25 mg 2 tabs bid. She has also restarted the furosemide today. She will cont to track her bp and if it does not trend down she will let me know. Hopefully it will start to trend down since the restart of the lasix Emma Yu

## 2011-06-25 NOTE — Telephone Encounter (Signed)
Per Misty Stanley calling for patient regarding b/p reading.

## 2011-07-04 ENCOUNTER — Ambulatory Visit: Payer: Medicare Other | Admitting: Endocrinology

## 2011-07-06 ENCOUNTER — Ambulatory Visit (INDEPENDENT_AMBULATORY_CARE_PROVIDER_SITE_OTHER): Payer: Medicare Other | Admitting: Internal Medicine

## 2011-07-06 ENCOUNTER — Ambulatory Visit (INDEPENDENT_AMBULATORY_CARE_PROVIDER_SITE_OTHER): Payer: Medicare Other | Admitting: Endocrinology

## 2011-07-06 ENCOUNTER — Encounter: Payer: Self-pay | Admitting: Internal Medicine

## 2011-07-06 ENCOUNTER — Encounter: Payer: Self-pay | Admitting: Endocrinology

## 2011-07-06 DIAGNOSIS — R259 Unspecified abnormal involuntary movements: Secondary | ICD-10-CM

## 2011-07-06 DIAGNOSIS — E109 Type 1 diabetes mellitus without complications: Secondary | ICD-10-CM

## 2011-07-06 DIAGNOSIS — R251 Tremor, unspecified: Secondary | ICD-10-CM | POA: Insufficient documentation

## 2011-07-06 DIAGNOSIS — M1990 Unspecified osteoarthritis, unspecified site: Secondary | ICD-10-CM

## 2011-07-06 DIAGNOSIS — I739 Peripheral vascular disease, unspecified: Secondary | ICD-10-CM

## 2011-07-06 DIAGNOSIS — I10 Essential (primary) hypertension: Secondary | ICD-10-CM

## 2011-07-06 MED ORDER — TRAMADOL HCL 50 MG PO TABS: 50.0000 mg | ORAL_TABLET | Freq: Four times a day (QID) | ORAL | Status: DC | PRN

## 2011-07-06 NOTE — Patient Instructions (Signed)
It was good to see you today. We have reviewed your prior records including labs and tests today All medications reviewed, no changes at this time. Refill on medication(s) as discussed today. Continue working with Dr. Modesto Charon neurology on your tremor. Please schedule followup in 4 months, call sooner if problems.

## 2011-07-06 NOTE — Assessment & Plan Note (Signed)
S/p r THR 10/2010 - less pain but not resolved -  Cont tramadol as ongoing with tylenol and gabapentin - refills today

## 2011-07-06 NOTE — Assessment & Plan Note (Signed)
Home readings reviewed: high and variable - but BP looks good here today The current medical regimen is effective;  continue present plan and medications.  BP Readings from Last 3 Encounters:  07/06/11 126/48  07/06/11 126/48  05/24/11 156/56

## 2011-07-06 NOTE — Patient Instructions (Addendum)
Here is a paper to get your a1c checked at [facility]. please call [phone] to hear your test results.  If you do not have the result 10 days after having it drawn, please call us back.  You will be prompted to enter the 9-digit "MRN" number that appears at the top left of this page, followed by #.  Then you will hear the message. pending the test results, please continue basal rate of 0.9 unit/hr, 24 hrs per day.   Take a bolus of 16 units with each meal.   continue correction bolus (which some people call "sensitivity," or "insulin sensitivity ratio," or just "isr") of 1 unit for each by 25 which your glucose exceeds 120. check your blood sugar 4 times a day--before the 3 meals, and at bedtime.  also check if you have symptoms of your blood sugar being too high or too low.  please keep a record of the readings and bring it to your next appointment here.  please call us sooner if you are having low blood sugar episodes. Please make a follow-up appointment in 3 months.

## 2011-07-06 NOTE — Assessment & Plan Note (Signed)
Intolerant of Pletal side effects -  Planning carotid doppler with cards as neuro recommendation due to bruit - no neuro symptoms

## 2011-07-06 NOTE — Progress Notes (Signed)
Subjective:    Patient ID: Emma Yu, female    DOB: 1926/12/10, 77 y.o.   MRN: [id number]  HPI  Here for follow up - reviewed chronic medical issues today:  DM2 -follows with endo -stopped pletal due to increase cbg trend on med- reports compliance with ongoing medical treatment and no changes in medication dose or frequency. denies adverse side effects related to current therapy. denies hypoglycemia symptoms -   dyslipidemia - reports compliance with ongoing medical treatment and no changes in medication dose or frequency. denies adverse side effects related to current therapy. no GI or muscle SE  HTN - reports compliance with ongoing medical treatment - reviewed changes in medication due to tremor (intol propanolol). denies adverse side effects related to current therapy. no headache or chest pain -  chronic LBP - improved after R THR 10/2010 but not resolved - on tramadol instead of vicodin  PVD/PAD - no longer on pletal - follows with cards for same - continued burning pain in legs despite increase gabapentin - leg pain associated with redness and tight swelling BLE (less lasix than prior to hosp)  Past Medical History  Diagnosis Date  . PAD (peripheral artery disease)     s/p bilateral comon iliac artery stenting in 2002. Known significant  R SFA  disease  . Aortic stenosis     Mild  . Diabetes mellitus   . Hypertension   . Hyperlipidemia   . Venous insufficiency   . Heart murmur   . CHF (congestive heart failure)     normal EF  . Atrial fib/flutter, transient   . Atrial fibrillation     post op  . DJD (degenerative joint disease) of hip     s/p R THR 10/2010    Review of Systems  Constitutional: Positive for fatigue. Negative for fever and unexpected weight change.  Respiratory: Negative for shortness of breath and wheezing.   Cardiovascular: Negative for palpitations.  Neurological: Negative for headaches.       Objective:   Physical Exam  BP 126/48   Pulse 60  Temp(Src) 97.9 F (36.6 C) (Oral)  Wt 149 lb (67.586 kg)  SpO2 93% Wt Readings from Last 3 Encounters:  07/06/11 149 lb (67.586 kg)  07/06/11 149 lb (67.586 kg)  05/24/11 148 lb 12.8 oz (67.495 kg)   Constitutional: She appears well-developed and well-nourished. No distress. Friend at side Neck: Normal range of motion. Neck supple. No JVD present. No thyromegaly present.  Cardiovascular: Normal rate, regular rhythm and normal heart sounds.  4/6 holosyst murmur heard without change. tight 1+ BLE edema with venous insuff and blistering/erythema distal LE. Pulmonary/Chest: Effort normal and breath sounds normal. No respiratory distress. She has no wheezes.  Neurological: intention tremor BUE with occasional myoclonic spell - minimal neck/head neck tremor - alert and oriented to person, place, and time. No cranial nerve deficit. Coordination normal.  Skin: Chronic venous insuff changes of distal BLE, L>R. No rash noted. No erythema. +weeping Psychiatric: She has a normal mood and affect. Her behavior is normal. Judgment and thought content normal.      Lab Results  Component Value Date   WBC 7.1 04/19/2011   HGB 13.0 04/19/2011   HCT 39.4 04/19/2011   PLT 191.0 04/19/2011   CHOL 118 04/19/2011   TRIG 134.0 04/19/2011   HDL 53.50 04/19/2011   ALT 19 04/19/2011   AST 22 04/19/2011   NA 140 01/10/2011   K 4.2 01/10/2011   CL 101  01/10/2011   CREATININE 0.81 01/10/2011   BUN 25* 01/10/2011   CO2 31 01/10/2011   TSH 3.91 04/19/2011   INR 1.01 01/10/2011   HGBA1C 6.6* 04/19/2011    Assessment & Plan:  See problem list. Medications and labs reviewed today.

## 2011-07-06 NOTE — Progress Notes (Signed)
Subjective:    Patient ID: Emma Yu, female    DOB: 10-26-1926, 77 y.o.   MRN: [id number]  HPI Pt returns for f/u of type 1 dm (1999).  She continues to exceed the prescribed insulin pump dosages.  she brings a record of her cbg's which i have reviewed today.  It varies from 82-200, but most are in the low-100's.  There is no trend throughout the day.  She did not believe her last a1c, and wants to get it checked at [facility].   Past Medical History  Diagnosis Date  . PAD (peripheral artery disease)     s/p bilateral comon iliac artery stenting in 2002. Known significant  R SFA  disease  . Aortic stenosis     Mild  . Diabetes mellitus   . Hypertension   . Hyperlipidemia   . Venous insufficiency   . Heart murmur   . CHF (congestive heart failure)     normal EF  . Atrial fib/flutter, transient   . Atrial fibrillation     post op  . DJD (degenerative joint disease) of hip     s/p R THR 10/2010    Past Surgical History  Procedure Date  . Total hip arthroplasty 1991, 1994    redo in 1994  . Lumbar disc surgery 1999  . Cardiac catheterization 01/10/2011    No significant CAD    History   Social History  . Marital Status: Widowed    Spouse Name: N/A    Number of Children: N/A  . Years of Education: N/A   Occupational History  .      retired   Social History Main Topics  . Smoking status: Former Smoker -- 1.5 packs/day for 25 years    Types: Cigarettes    Quit date: 08/20/1985  . Smokeless tobacco: Never Used   Comment: Retired-widowed 89, lives with son  . Alcohol Use: Not on file  . Drug Use: No  . Sexually Active: Not on file   Other Topics Concern  . Not on file   Social History Narrative   Lives with sonWidowed 1991Quit smoking in 1987    Current Outpatient Prescriptions on File Prior to Visit  Medication Sig Dispense Refill  . acetaminophen (TYLENOL) 325 MG tablet As needed       . Ascorbic Acid (VITAMIN C) 500 MG tablet Take 500 mg by mouth  daily.        Marland Kitchen aspirin 81 MG tablet Take 2 at bedtime       . b complex vitamins tablet Take 1 tablet by mouth daily.        . Cholecalciferol (VITAMIN D3) 2000 UNITS capsule Take 2,000 Units by mouth daily.        . Cinnamon 500 MG capsule 4 capsules daily       . cloNIDine (CATAPRES) 0.2 MG tablet Take 1 tablet (0.2 mg total) by mouth 4 (four) times daily.  120 tablet  11  . Coenzyme Q10 10 MG capsule (300mg  caps) 1 capsule by mouth daily       . furosemide (LASIX) 40 MG tablet Take 2 tablets in the morning and 1 tablet at 2 PM  90 tablet  3  . gabapentin (NEURONTIN) 300 MG capsule Take 2 capsules (600 mg total) by mouth at bedtime.  60 capsule  6  . insulin lispro (HUMALOG) 100 UNIT/ML injection use as directed for insulin pump--total of 80 units/day  80 mL  12  .  losartan (COZAAR) 100 MG tablet Take 1 tablet (100 mg total) by mouth daily.  30 tablet  11  . methocarbamol (ROBAXIN) 500 MG tablet Take 1 tablet (500 mg total) by mouth 3 (three) times daily as needed.  90 tablet  1  . metoprolol tartrate (LOPRESSOR) 25 MG tablet 2 tablets by mouth twice daily       . naproxen sodium (ALEVE) 220 MG tablet as needed.        . Omega-3 Fatty Acids (FISH OIL) 1200 MG CAPS Take 1,000 mg by mouth 2 (two) times daily.       Marland Kitchen omeprazole (PRILOSEC) 40 MG capsule Take 40 mg by mouth daily.        . pravastatin (PRAVACHOL) 20 MG tablet Take 1 tablet (20 mg total) by mouth every evening.  30 tablet  6  . traMADol (ULTRAM) 50 MG tablet Take 1 tablet (50 mg total) by mouth every 6 (six) hours as needed.  120 tablet  6    Allergies  Allergen Reactions  . Carvedilol Other (See Comments)    Heart stops  . Diltiazem Hcl   . Fenofibrate   . Hctz (Hydrochlorothiazide)   . Nisoldipine   . Propranolol Diarrhea  . Sular   . Tekturna (Aliskiren Fumarate)   . Valturna (Aliskiren-Valsartan)     Family History  Problem Relation Age of Onset  . Diabetes Father   . Diabetes Other     5/8 sibs    BP  126/48  Pulse 60  Temp(Src) 97.9 F (36.6 C) (Oral)  Ht 5' (1.524 m)  Wt 149 lb (67.586 kg)  BMI 29.10 kg/m2  SpO2 93%  Review of Systems denies hypoglycemia    Objective:   Physical Exam VITAL SIGNS:  See vs page GENERAL: no distress SKIN:  Insulin infusion sites at the anterior abdomen are normal      Assessment & Plan:  Type 1 DM, overcontrolled due to exceeding prescribed dosage.

## 2011-07-06 NOTE — Assessment & Plan Note (Signed)
Working with neuro on same: intolerant of propanolol due to GI side effects Keep follow up as planned in 07/2011 - no med changes recommended today

## 2011-07-30 ENCOUNTER — Ambulatory Visit: Payer: Medicare Other | Admitting: Neurology

## 2011-07-30 ENCOUNTER — Other Ambulatory Visit: Payer: Self-pay | Admitting: *Deleted

## 2011-07-30 DIAGNOSIS — M1990 Unspecified osteoarthritis, unspecified site: Secondary | ICD-10-CM

## 2011-07-30 MED ORDER — PRAVASTATIN SODIUM 20 MG PO TABS: 20.0000 mg | ORAL_TABLET | Freq: Every evening | ORAL | Status: DC

## 2011-07-30 MED ORDER — TRAMADOL HCL 50 MG PO TABS: 50.0000 mg | ORAL_TABLET | Freq: Four times a day (QID) | ORAL | Status: DC | PRN

## 2011-07-30 MED ORDER — OMEPRAZOLE 40 MG PO CPDR: 40.0000 mg | DELAYED_RELEASE_CAPSULE | Freq: Every day | ORAL | Status: DC

## 2011-07-30 MED ORDER — METOPROLOL TARTRATE 25 MG PO TABS: 50.0000 mg | ORAL_TABLET | Freq: Two times a day (BID) | ORAL | Status: DC

## 2011-07-30 MED ORDER — LOSARTAN POTASSIUM 100 MG PO TABS: 100.0000 mg | ORAL_TABLET | Freq: Every day | ORAL | Status: DC

## 2011-07-30 MED ORDER — FUROSEMIDE 40 MG PO TABS: ORAL_TABLET | ORAL | Status: DC

## 2011-07-30 MED ORDER — CLONIDINE HCL 0.2 MG PO TABS: 0.2000 mg | ORAL_TABLET | Freq: Four times a day (QID) | ORAL | Status: DC

## 2011-08-01 ENCOUNTER — Other Ambulatory Visit: Payer: Self-pay | Admitting: *Deleted

## 2011-08-01 ENCOUNTER — Encounter: Payer: Self-pay | Admitting: Cardiovascular Disease

## 2011-08-01 MED ORDER — GABAPENTIN 300 MG PO CAPS: 600.0000 mg | ORAL_CAPSULE | Freq: Every day | ORAL | Status: DC

## 2011-08-20 ENCOUNTER — Emergency Department (HOSPITAL_COMMUNITY)
Admission: EM | Admit: 2011-08-20 | Discharge: 2011-08-20 | Disposition: A | Discharge status: Home / Self Care | Payer: Medicare Other | Attending: Emergency Medicine | Admitting: Emergency Medicine

## 2011-08-20 ENCOUNTER — Encounter (HOSPITAL_COMMUNITY): Payer: Self-pay | Admitting: *Deleted

## 2011-08-20 DIAGNOSIS — E119 Type 2 diabetes mellitus without complications: Secondary | ICD-10-CM | POA: Insufficient documentation

## 2011-08-20 DIAGNOSIS — Z9889 Other specified postprocedural states: Secondary | ICD-10-CM | POA: Insufficient documentation

## 2011-08-20 DIAGNOSIS — I10 Essential (primary) hypertension: Secondary | ICD-10-CM | POA: Insufficient documentation

## 2011-08-20 DIAGNOSIS — I4891 Unspecified atrial fibrillation: Secondary | ICD-10-CM | POA: Insufficient documentation

## 2011-08-20 DIAGNOSIS — R109 Unspecified abdominal pain: Secondary | ICD-10-CM | POA: Insufficient documentation

## 2011-08-20 DIAGNOSIS — Z79899 Other long term (current) drug therapy: Secondary | ICD-10-CM | POA: Insufficient documentation

## 2011-08-20 DIAGNOSIS — R350 Frequency of micturition: Secondary | ICD-10-CM | POA: Insufficient documentation

## 2011-08-20 DIAGNOSIS — R300 Dysuria: Secondary | ICD-10-CM | POA: Insufficient documentation

## 2011-08-20 DIAGNOSIS — I739 Peripheral vascular disease, unspecified: Secondary | ICD-10-CM | POA: Insufficient documentation

## 2011-08-20 DIAGNOSIS — I509 Heart failure, unspecified: Secondary | ICD-10-CM | POA: Insufficient documentation

## 2011-08-20 DIAGNOSIS — M1990 Unspecified osteoarthritis, unspecified site: Secondary | ICD-10-CM | POA: Insufficient documentation

## 2011-08-20 DIAGNOSIS — E785 Hyperlipidemia, unspecified: Secondary | ICD-10-CM | POA: Insufficient documentation

## 2011-08-20 DIAGNOSIS — N390 Urinary tract infection, site not specified: Secondary | ICD-10-CM | POA: Insufficient documentation

## 2011-08-20 DIAGNOSIS — R10819 Abdominal tenderness, unspecified site: Secondary | ICD-10-CM | POA: Insufficient documentation

## 2011-08-20 LAB — URINALYSIS, ROUTINE W REFLEX MICROSCOPIC
Bilirubin Urine: NEGATIVE
Specific Gravity, Urine: 1.016 (ref 1.005–1.030)
Urobilinogen, UA: 0.2 mg/dL (ref 0.0–1.0)
pH: 7.5 (ref 5.0–8.0)

## 2011-08-20 LAB — URINE MICROSCOPIC-ADD ON

## 2011-08-20 MED ORDER — CEPHALEXIN 250 MG PO CAPS
500.0000 mg | ORAL_CAPSULE | Freq: Once | ORAL | Status: AC
  Administered 2011-08-20: 500 mg via ORAL
  Filled 2011-08-20: qty 2

## 2011-08-20 MED ORDER — CEPHALEXIN 500 MG PO CAPS: 500.0000 mg | ORAL_CAPSULE | Freq: Four times a day (QID) | ORAL | Status: AC

## 2011-08-20 NOTE — ED Provider Notes (Signed)
History     CSN: [id number]  Arrival date & time 08/20/11  2042   First MD Initiated Contact with Patient 08/20/11 2324      Chief Complaint  Patient presents with  . Urinary Tract Infection    (Consider location/radiation/quality/duration/timing/severity/associated sxs/prior treatment) Patient is a 77 y.o. female presenting with urinary tract infection. The history is provided by the patient (The patient states that she started having dysuria and frequency today she believes she has a UTI and).  Urinary Tract Infection This is a new problem. The current episode started 12 to 24 hours ago. The problem occurs constantly. The problem has not changed since onset.Associated symptoms include abdominal pain. Pertinent negatives include no chest pain and no headaches. The symptoms are aggravated by nothing. The symptoms are relieved by nothing.    Past Medical History  Diagnosis Date  . PAD (peripheral artery disease)     s/p bilateral comon iliac artery stenting in 2002. Known significant  R SFA  disease  . Aortic stenosis     Mild  . Diabetes mellitus   . Hypertension   . Hyperlipidemia   . Venous insufficiency   . Heart murmur   . CHF (congestive heart failure)     normal EF  . Atrial fib/flutter, transient   . [place]ath-induced atrial fibrillation     post op  . DJD (degenerative joint disease) of hip     s/p R THR 10/2010    Past Surgical History  Procedure Date  . Total hip arthroplasty 1991, 1994    redo in 1994  . Lumbar disc surgery 1999  . Cardiac catheterization 01/10/2011    No significant CAD    Family History  Problem Relation Age of Onset  . Diabetes Father   . Diabetes Other     5/8 sibs    History  Substance Use Topics  . Smoking status: Never Smoker   . Smokeless tobacco: Never Used   Comment: Retired-widowed 89, lives with son  . Alcohol Use: No    OB History    Grav Para Term Preterm Abortions TAB SAB Ect Mult Living                   Review of Systems  Constitutional: Negative for fatigue.  HENT: Negative for congestion, sinus pressure and ear discharge.   Eyes: Negative for discharge.  Respiratory: Negative for cough.   Cardiovascular: Negative for chest pain.  Gastrointestinal: Positive for abdominal pain. Negative for diarrhea.       Mild tendernous suprapubic  Genitourinary: Negative for frequency and hematuria.  Musculoskeletal: Negative for back pain.  Skin: Negative for rash.  Neurological: Negative for seizures and headaches.  Hematological: Negative.   Psychiatric/Behavioral: Negative for hallucinations.    Allergies  Carvedilol; Diltiazem hcl; Fenofibrate; Hctz; Nisoldipine; Nisoldipine; Propranolol; Derl Barrow  Home Medications   Current Outpatient Rx  Name Route Sig Dispense Refill  . ACETAMINOPHEN 325 MG PO TABS Oral Take 325 mg by mouth once as needed. As needed for pain.    Marland Kitchen VITAMIN C 500 MG PO TABS Oral Take 500 mg by mouth daily.      . ASPIRIN 81 MG PO TABS  Take 2 at bedtime     . B COMPLEX PO TABS Oral Take 1 tablet by mouth daily.      Marland Kitchen VITAMIN D3 2000 UNITS PO CAPS Oral Take 2,000 Units by mouth daily.      Marland Kitchen CINNAMON 500 MG PO  CAPS  4 capsules daily     . CLONIDINE HCL 0.2 MG PO TABS Oral Take 0.2 mg by mouth 4 (four) times daily.      Marland Kitchen COENZYME Q10 10 MG PO CAPS  (300mg  caps) 1 capsule by mouth daily     . OMEGA-3 FATTY ACIDS 1000 MG PO CAPS Oral Take 2 g by mouth daily.      . FUROSEMIDE 40 MG PO TABS Oral Take 40 mg by mouth daily.      Marland Kitchen GABAPENTIN 300 MG PO CAPS Oral Take 2 capsules (600 mg total) by mouth at bedtime. 180 capsule 1  . INSULIN LISPRO (HUMAN) 100 UNIT/ML [place] SOLN  use as directed for insulin pump--total of 80 units/day 80 mL 12  . LOSARTAN POTASSIUM 100 MG PO TABS Oral Take 1 tablet (100 mg total) by mouth daily. 90 tablet 1  . METHOCARBAMOL 500 MG PO TABS Oral Take 1 tablet (500 mg total) by mouth 3 (three) times daily as needed. 90 tablet 1  .  METOPROLOL TARTRATE 25 MG PO TABS Oral Take 2 tablets (50 mg total) by mouth 2 (two) times daily. 2 tablets by mouth twice daily 360 tablet 1  . NAPROXEN SODIUM 220 MG PO TABS Oral Take 220 mg by mouth as needed. For pain    . OMEPRAZOLE 40 MG PO CPDR Oral Take 1 capsule (40 mg total) by mouth daily. 90 capsule 1  . PRAVASTATIN SODIUM 20 MG PO TABS Oral Take 1 tablet (20 mg total) by mouth every evening. 90 tablet 1  . TRAMADOL HCL 50 MG PO TABS Oral Take 1 tablet (50 mg total) by mouth every 6 (six) hours as needed. 120 tablet 0  . CEPHALEXIN 500 MG PO CAPS Oral Take 1 capsule (500 mg total) by mouth 4 (four) times daily. 28 capsule 0    BP 221/74  Pulse 60  Temp(Src) 98.5 F (36.9 C) (Oral)  Resp 20  SpO2 93%  Physical Exam  ED Course  Procedures (including critical care time)  Labs Reviewed  URINALYSIS, ROUTINE W REFLEX MICROSCOPIC - Abnormal; Notable for the following:    APPearance TURBID (*)    Hgb urine dipstick LARGE (*)    Ketones, ur 15 (*)    Leukocytes, UA MODERATE (*)    All other components within normal limits  URINE MICROSCOPIC-ADD ON - Abnormal; Notable for the following:    Squamous Epithelial / LPF FEW (*)    Bacteria, UA MANY (*)    Crystals TRIPLE PHOSPHATE CRYSTALS (*)    All other components within normal limits   No results found.   1. UTI (lower urinary tract infection)       MDM          Benny Lennert, MD 08/20/11 [phone]

## 2011-08-20 NOTE — ED Notes (Signed)
The pt thinks she has a uti.  She has had urinary frequency and some bloody urine today

## 2011-08-22 ENCOUNTER — Encounter: Payer: Self-pay | Admitting: Internal Medicine

## 2011-08-22 ENCOUNTER — Ambulatory Visit (INDEPENDENT_AMBULATORY_CARE_PROVIDER_SITE_OTHER): Payer: Medicare Other | Admitting: Internal Medicine

## 2011-08-22 DIAGNOSIS — I10 Essential (primary) hypertension: Secondary | ICD-10-CM

## 2011-08-22 DIAGNOSIS — M6530 Trigger finger, unspecified finger: Secondary | ICD-10-CM

## 2011-08-22 DIAGNOSIS — N390 Urinary tract infection, site not specified: Secondary | ICD-10-CM

## 2011-08-22 NOTE — Patient Instructions (Signed)
It was good to see you today. We have reviewed your ER records including labs and tests today - finish the Keflex antibiotics as prescribed for bladder infection Increase clonidine to 2 at night - continue to keep records of your blood pressure and pulse  Trigger Finger Trigger finger (digital tendinitis and stenosing tenosynovitis) is a common disorder that causes an often painful catching of the fingers or thumb. It occurs as a clicking, snapping or locking of a finger in the palm of the hand. The reason for this is that there is a problem with the tendons which flex the fingers sliding smoothly through their sheaths. The cause of this may be inflammation of the tendon and sheath, or from a thickening or nodule in the tendon. The condition may occur in any finger or a couple fingers at the same time. The cause may be overuse while doing the same activity over and over again with your hands.   Tendons are the tough cords that connect the muscles to bones. Muscles and tendons are part of the system which allows your body to move. When muscles contract in the forearm on the palm side, they pull the tendons toward the elbow and cause the fingers and thumb to bend (flex) toward the palm. These are the flexor tendons. The tendons slide through a slippery smooth membrane (synovium) which is called the tendon sheath. The sheaths have areas of tough fibrous tissues surrounding them which hold the tendons close to the bone. These are called pulleys because they work like a pulley. The first pulley is in the palm of the hand near the crease which runs across your palm. If the area of the tendon thickening is near the pulley, the tendon cannot slide smoothly through the pulley and this causes the trigger finger. The finger may lock with the finger curled or suddenly straighten out with a snap. This is more common in patients with rheumatoid arthritis and diabetes. Left untreated, the condition may get worse to the  point where the finger becomes locked in flexion, like making a fist, or less commonly locked with the finger straightened out. DIAGNOSIS   Your caregiver will easily make this diagnosis on examination. TREATMENT    Splinting for 6 to 8 weeks of time may be helpful. Use the splints as your caregiver suggests.     Heat used for twenty minutes at least four times a day followed by ice packs for twenty minutes unless directed otherwise by your caregiver may be helpful. If you find either heat or cold seems to be making the problem worse, quit using them and ask your caregiver for directions.     Cortisone injections along with splinting may speed up recovery. Several injections may be required. Cortisone may give relief after one injection.     Only take over-the-counter or prescription medicines for pain, discomfort, or fever as directed by your caregiver.     Surgery is another treatment that may be used if conservative treatments using injection and splinting does not work. Surgery can be minor without incisions (a cut does not have to be made) and can be done with a needle through the skin. No stitches are needed and most patients may return to work the same day.     Other surgical choices involve an open procedure where the surgeon opens the hand through a small incision (cut) and cuts the pulley so the tendon can again slide smoothly. Your hand will still work fine. This small  operation requires stitches and the recovery will be a little longer and the incisions will need to be protected until completely healed. You may have to limit your activities for up to 6 months.     Occupational or hand therapy may be required if there is stiffness remaining in the finger.  RISKS AND COMPLICATIONS Complications are uncommon but some problems that may occur are:  Recurrence of the trigger finger. This does not mean that the surgery was not well done. It simply means that you may have formed scar tissue  following surgery that causes the problem to reoccur.     Infection which could ruin the results of the surgery and can result in a finger which is frozen and can not move normally.     Nerve injury is possible which could result in permanent numbness of one or more fingers.  CARE AFTER SURGERY  Elevate your hand above your heart and use ice as instructed.     Follow instructions regarding finger motion/exercise.     Keep the surgical wound dry for at least 48 hrs or longer if instructed.     Keep your follow-up appointments.     Return to work and normal activities as instructed.  SEEK IMMEDIATE MEDICAL CARE IF:   Your problems are getting worse or you do not obtain relief from the treatment. Document Released: 05/26/2004 Document Revised: 04/18/2011 Document Reviewed: 01/18/2009 [facility] Patient Information 2012 [place], [place].

## 2011-08-22 NOTE — Progress Notes (Signed)
Subjective:    Patient ID: Emma Yu, female    DOB: Mar 05, 1927, 78 y.o.   MRN: [id number]  HPI  Here for follow up - seen 12/31 pm in ER for UTI > Keflex x 1 week  Also reviewed chronic medical issues today:  DM2 - increase cbgs on pletal per pt - so stopped pletal- reports compliance with ongoing medical treatment and no changes in medication dose or frequency. denies adverse side effects related to current therapy. denies hypoglycemia symptoms - follows with endo for same  dyslipidemia - reports compliance with ongoing medical treatment and no changes in medication dose or frequency. denies adverse side effects related to current therapy. no GI or muscle SE  HTN - reports compliance with ongoing medical treatment - denies adverse side effects related to current therapy. no headache or chest pain -  chronic LBP - improved after R THR 10/2010 but not resolved - on tramadol instead of vicodin  PVD/PAD - no longer on pletal - follows with cards for same - continued burning pain in legs despite increase gabapentin - leg pain associated with redness and tight swelling BLE (less lasix than prior to hosp)  Essential tremor - seen by neuro for same - intol of propanolol trial  Past Medical History  Diagnosis Date  . PAD (peripheral artery disease)     s/p bilateral comon iliac artery stenting in 2002. Known significant  R SFA  disease  . Aortic stenosis     Mild  . Diabetes mellitus   . Hypertension   . Hyperlipidemia   . Venous insufficiency   . CHF (congestive heart failure)     normal EF  . Atrial fib/flutter, transient   . [place]-induced atrial fibrillation     post op  . DJD (degenerative joint disease) of hip     s/p R THR 10/2010     Review of Systems  Constitutional: Negative for fever, fatigue and unexpected weight change.  Respiratory: Negative for cough and shortness of breath.   Cardiovascular: Negative for chest pain and palpitations.  Neurological:  Negative for headaches.       Objective:   Physical Exam  BP 130/62  Pulse 60  Temp(Src) 97.2 F (36.2 C) (Oral)  Wt 150 lb (68.04 kg) Constitutional: She appears well-developed and well-nourished. No distress. Friend at side Neck: Normal range of motion. Neck supple. No JVD present. No thyromegaly present.  Cardiovascular: Normal rate, regular rhythm and normal heart sounds.  4/6 holosyst murmur heard without change. tight 1+ BLE edema with venous insuff Pulmonary/Chest: Effort normal and breath sounds normal. No respiratory distress or wheezes.  Neurological: intention tremor BUE with occasional myoclonic spell - minimal neck/head neck tremor - alert and oriented to person, place, and time. No cranial nerve deficit. Coordination normal.  Mskel: left 4th finger with trigger finger Skin: Chronic venous insuff changes of distal BLE, L>R. No rash noted. No erythema. +weeping Psychiatric: She has a normal mood and affect. Her behavior is normal. Judgment and thought content normal.       Lab Results  Component Value Date   WBC 7.1 04/19/2011   HGB 13.0 04/19/2011   HCT 39.4 04/19/2011   PLT 191.0 04/19/2011   CHOL 118 04/19/2011   TRIG 134.0 04/19/2011   HDL 53.50 04/19/2011   ALT 19 04/19/2011   AST 22 04/19/2011   NA 140 01/10/2011   K 4.2 01/10/2011   CL 101 01/10/2011   CREATININE 0.81 01/10/2011   BUN  25* 01/10/2011   CO2 31 01/10/2011   TSH 3.91 04/19/2011   INR 1.01 01/10/2011   HGBA1C 6.6* 04/19/2011    Assessment & Plan:  UTI- ER records reviewed - complete antibiotics as ongoing, symptoms improving  Trigger finger - education and reassurance provided -   Also See problem list. Medications and labs reviewed today.

## 2011-08-22 NOTE — Assessment & Plan Note (Signed)
Home readings reviewed: high and variable - but BP looks good here today Increase clonidine per pr request  BP Readings from Last 3 Encounters:  08/22/11 130/62  08/20/11 221/74  07/06/11 126/48

## 2011-09-03 ENCOUNTER — Telehealth: Payer: Self-pay

## 2011-09-03 NOTE — Telephone Encounter (Signed)
Informed the patient of MD's instructions to call back in AM with BP

## 2011-09-03 NOTE — Telephone Encounter (Signed)
BP can be quite labile for elder persons with CV disease;  If no CP, SOB, HA, or other pain, ok to repeat BP at 2 hours to confirm if getting better or persists  Please call pt, or allow pt to call us at that time  If not able to check BP further at home, can she come here for Nurse Visit for BP check?

## 2011-09-03 NOTE — Telephone Encounter (Signed)
Ok , then call in AM please with BP reading

## 2011-09-03 NOTE — Telephone Encounter (Signed)
Pt called c/o severely elevated BP 204/52. Pt denies any associated sxs and is demanding to speak with MD for advisement. Pt told that she should go to ER for severely elevated BP but she refused and is requesting MD return call. Please advise.

## 2011-09-03 NOTE — Telephone Encounter (Signed)
Called the patient and she is having no symptoms at this time. The patient request to call her back at 3:30 and she will recheck her BP

## 2011-09-03 NOTE — Telephone Encounter (Signed)
Called the patient back at 3:30, she did recheck her BP it was 179/49, she is having no HA, SOB or Chest pain. The patient stated she will take 3 BP pills with her diner and stated the BP would go down then, please advise

## 2011-09-04 ENCOUNTER — Telehealth: Payer: Self-pay

## 2011-09-04 MED ORDER — LOSARTAN POTASSIUM 100 MG PO TABS: 100.0000 mg | ORAL_TABLET | Freq: Every day | ORAL | Status: DC

## 2011-09-04 NOTE — Telephone Encounter (Signed)
Called the patient informed of MD's instructions. The patient stated she is taking losartan and 2 clonidine pills already in the evening.

## 2011-09-04 NOTE — Telephone Encounter (Signed)
Called the patient line was busy. 

## 2011-09-04 NOTE — Telephone Encounter (Signed)
If she means she is taking the clonidine 0.2 mg pills - TWO in the evening, I think this is too much, and theoretically I wonder if she has some lower BP overnight with a "rebound" in the AM that this medication can do  Please take the clonidine 0.2 mg twice per day, metoprolol twice per day, the lasix in the AM, and losartan in the evening, and cont to monitor BP, consider OV later this wk with Dr Felicity Coyer if BP in the AM cont's to remain high before taking AM meds;  Other options could include increased clonidine, increased metoprolol, or change of losartan to benicar which is more reliable to be effective for the full 24 hrs

## 2011-09-04 NOTE — Telephone Encounter (Signed)
Please continue same meds, but take the losartan in the [facility], and continue all other meds the same way  Please cont to monitor BP and call Friday jan 18 with BP results

## 2011-09-04 NOTE — Telephone Encounter (Signed)
Patient called back this AM with her BP readings as instructed from yesterday 09/03/11. This AM at 6:45 BP was 204/69, then rechecked after breakfast and taking medications was then 137/42. Please advise

## 2011-09-05 NOTE — Telephone Encounter (Signed)
Called the patient informed of MD's instructions. Her BP this AM before food and meds. Was 194/57, After eating and taking meds. BP was 141/41. The patient did schedule appointment later this week with Dr. Felicity Coyer as recommended by [facility].

## 2011-09-06 ENCOUNTER — Ambulatory Visit (INDEPENDENT_AMBULATORY_CARE_PROVIDER_SITE_OTHER): Payer: Medicare Other | Admitting: Internal Medicine

## 2011-09-06 ENCOUNTER — Encounter: Payer: Self-pay | Admitting: Internal Medicine

## 2011-09-06 DIAGNOSIS — I10 Essential (primary) hypertension: Secondary | ICD-10-CM

## 2011-09-06 NOTE — Patient Instructions (Signed)
It was good to see you today. Resume clonidine 1 tab 4x/day - other medications as reviewed - if still high, call for additional medication changes

## 2011-09-06 NOTE — Assessment & Plan Note (Signed)
Home readings reviewed: high and variable - Resume increase clonidine and consider adding hydralazine -  Multiple drug intolerances reviewed in depth to date (CCB, propanolol, carvediolol, other ARBs)  BP Readings from Last 3 Encounters:  09/06/11 212/70  08/22/11 130/62  08/20/11 221/74

## 2011-09-06 NOTE — Progress Notes (Signed)
Subjective:    Patient ID: Emma Yu, female    DOB: 1927/07/03, 78 y.o.   MRN: [id number]  Hypertension Pertinent negatives include no headaches, palpitations or shortness of breath.   Here for follow up hypertension - uncontrolled - worse since decrease in clonidine 2 days ago - now 190-220 at home - but feels well: no chest pain, headache, weakness, palpitations or swelling - no shortness of breath, no nausea and vomiting    Also reviewed chronic medical issues today:  DM2 -follows with endo -stopped pletal due to increase cbg trend on med- reports compliance with ongoing medical treatment and no changes in medication dose or frequency. denies adverse side effects related to current therapy. denies hypoglycemia symptoms -   dyslipidemia - reports compliance with ongoing medical treatment and no changes in medication dose or frequency. denies adverse side effects related to current therapy. no GI or muscle SE  HTN - see above -reports compliance with ongoing medical treatment - reviewed changes in medication. denies adverse side effects related to current therapy. no headache or chest pain -  chronic LBP - improved after R THR 10/2010 but not resolved - on tramadol instead of vicodin  PVD/PAD - no longer on pletal - follows with cards for same - continued burning pain in legs despite increase gabapentin - leg pain associated with redness and tight swelling BLE    Past Medical History  Diagnosis Date  . PAD (peripheral artery disease)     s/p bilateral comon iliac artery stenting in 2002. Known significant  R SFA  disease  . Aortic stenosis     Mild  . Diabetes mellitus   . Hypertension   . Hyperlipidemia   . Venous insufficiency   . CHF (congestive heart failure)     normal EF  . Atrial fib/flutter, transient   . Atrial fibrillation     post op  . DJD (degenerative joint disease) of hip     s/p R THR 10/2010    Review of Systems  Constitutional: Positive for  fatigue. Negative for fever and unexpected weight change.  Respiratory: Negative for shortness of breath and wheezing.   Cardiovascular: Negative for palpitations.  Neurological: Negative for headaches.       Objective:   Physical Exam  BP 212/70  Pulse 56  Temp(Src) 98.5 F (36.9 C) (Oral)  Wt 150 lb (68.04 kg)  SpO2 94% Wt Readings from Last 3 Encounters:  09/06/11 150 lb (68.04 kg)  08/22/11 150 lb (68.04 kg)  07/06/11 149 lb (67.586 kg)   Constitutional: She appears well-developed and well-nourished. No distress. Friend at side Neck: Normal range of motion. Neck supple. No JVD present. No thyromegaly present.  Cardiovascular: Normal rate, regular rhythm and normal heart sounds.  4/6 holosyst murmur heard without change. tight 1+ BLE edema with venous insuff and blistering/erythema distal LE. Pulmonary/Chest: Effort normal and breath sounds normal. No respiratory distress. She has no wheezes.  Neurological: intention tremor BUE with occasional myoclonic spell - minimal neck/head neck tremor - alert and oriented to person, place, and time. No cranial nerve deficit. Coordination normal.  Psychiatric: She has a normal mood and affect. Her behavior is normal. Judgment and thought content normal.      Lab Results  Component Value Date   WBC 7.1 04/19/2011   HGB 13.0 04/19/2011   HCT 39.4 04/19/2011   PLT 191.0 04/19/2011   CHOL 118 04/19/2011   TRIG 134.0 04/19/2011   HDL 53.50 04/19/2011  ALT 19 04/19/2011   AST 22 04/19/2011   NA 140 01/10/2011   K 4.2 01/10/2011   CL 101 01/10/2011   CREATININE 0.81 01/10/2011   BUN 25* 01/10/2011   CO2 31 01/10/2011   TSH 3.91 04/19/2011   INR 1.01 01/10/2011   HGBA1C 6.6* 04/19/2011    Assessment & Plan:  See problem list. Medications and labs reviewed today.

## 2011-09-07 ENCOUNTER — Telehealth: Payer: Self-pay

## 2011-09-07 MED ORDER — VALSARTAN 320 MG PO TABS: 320.0000 mg | ORAL_TABLET | Freq: Every day | ORAL | Status: DC

## 2011-09-07 NOTE — Telephone Encounter (Signed)
Patient informed. 

## 2011-09-07 NOTE — Telephone Encounter (Signed)
Pt called stating her BP is still elevated 210/62. Pt has taken her clonidine twice so far today and once last night. Pt denies andy HA, SOB or palpitations. Pt is requesting medication adjustment.

## 2011-09-07 NOTE — Telephone Encounter (Signed)
Ok to try change the losartan to diovan 320 mg, which also is generic now  Any further changes will require OV with Dr Felicity Coyer

## 2011-09-10 ENCOUNTER — Other Ambulatory Visit (INDEPENDENT_AMBULATORY_CARE_PROVIDER_SITE_OTHER): Payer: Medicare Other

## 2011-09-10 ENCOUNTER — Encounter: Payer: Self-pay | Admitting: Internal Medicine

## 2011-09-10 ENCOUNTER — Ambulatory Visit (INDEPENDENT_AMBULATORY_CARE_PROVIDER_SITE_OTHER): Payer: Medicare Other | Admitting: Internal Medicine

## 2011-09-10 DIAGNOSIS — N390 Urinary tract infection, site not specified: Secondary | ICD-10-CM | POA: Insufficient documentation

## 2011-09-10 DIAGNOSIS — I4891 Unspecified atrial fibrillation: Secondary | ICD-10-CM

## 2011-09-10 DIAGNOSIS — I10 Essential (primary) hypertension: Secondary | ICD-10-CM

## 2011-09-10 DIAGNOSIS — E7800 Pure hypercholesterolemia, unspecified: Secondary | ICD-10-CM

## 2011-09-10 DIAGNOSIS — E109 Type 1 diabetes mellitus without complications: Secondary | ICD-10-CM

## 2011-09-10 DIAGNOSIS — IMO0002 Reserved for concepts with insufficient information to code with codable children: Secondary | ICD-10-CM

## 2011-09-10 LAB — CBC WITH DIFFERENTIAL/PLATELET
Basophils Absolute: 0 10*3/uL (ref 0.0–0.1)
Lymphocytes Relative: 34.4 % (ref 12.0–46.0)
Lymphs Abs: 2.6 10*3/uL (ref 0.7–4.0)
Monocytes Relative: 11 % (ref 3.0–12.0)
Neutrophils Relative %: 50.8 % (ref 43.0–77.0)
Platelets: 199 10*3/uL (ref 150.0–400.0)
RDW: 15.4 % — ABNORMAL HIGH (ref 11.5–14.6)

## 2011-09-10 LAB — URINALYSIS, ROUTINE W REFLEX MICROSCOPIC
Urine Glucose: NEGATIVE
Urobilinogen, UA: 0.2 (ref 0.0–1.0)

## 2011-09-10 LAB — COMPREHENSIVE METABOLIC PANEL
ALT: 30 U/L (ref 0–35)
AST: 24 U/L (ref 0–37)
Albumin: 3.8 g/dL (ref 3.5–5.2)
BUN: 44 mg/dL — ABNORMAL HIGH (ref 6–23)
Calcium: 10.2 mg/dL (ref 8.4–10.5)
Chloride: 101 mEq/L (ref 96–112)
Potassium: 4.6 mEq/L (ref 3.5–5.1)

## 2011-09-10 LAB — LIPID PANEL
HDL: 49.9 mg/dL (ref >39.00)
LDL Cholesterol: 35 mg/dL (ref 0–99)
Total CHOL/HDL Ratio: 2
Triglycerides: 121 mg/dL (ref 0.0–149.0)
VLDL: 24.2 mg/dL (ref 0.0–40.0)

## 2011-09-10 LAB — HEMOGLOBIN A1C: Hgb A1c MFr Bld: 6 % (ref 4.6–6.5)

## 2011-09-10 LAB — TSH: TSH: 1.66 u[IU]/mL (ref 0.35–5.50)

## 2011-09-10 MED ORDER — NEBIVOLOL HCL 20 MG PO TABS: 1.0000 | ORAL_TABLET | Freq: Every day | ORAL | Status: DC

## 2011-09-10 MED ORDER — MOXIFLOXACIN HCL 400 MG PO TABS: 400.0000 mg | ORAL_TABLET | Freq: Every day | ORAL | Status: DC

## 2011-09-10 NOTE — Assessment & Plan Note (Signed)
She has issues with drug allergies, she has a prior note that says coreg makes her heart stop yet she has been on 100 mg per day or metoprolol so I question the validity of that allergy, there I have asked her to stop the metoprolol and start bystolic as she will still have beta-blocker coverage but she will benefit from the vasodilation of the alpha-blocker activity. Today I will check her renal function and lytes as well.

## 2011-09-10 NOTE — Progress Notes (Signed)
  Subjective:    Patient ID: Emma Yu, female    DOB: 1926-09-30, 78 y.o.   MRN: [id number]  Hypertension This is a chronic problem. The current episode started more than 1 year ago. The problem has been gradually worsening since onset. The problem is uncontrolled. Associated symptoms include anxiety. Pertinent negatives include no blurred vision, chest pain, headaches, malaise/fatigue, neck pain, orthopnea, palpitations, peripheral edema, PND, shortness of breath or sweats. Past treatments include central alpha agonists, diuretics, beta blockers and angiotensin blockers. The current treatment provides mild improvement. Compliance problems include exercise and diet.       Review of Systems  Constitutional: Negative for fever, chills, malaise/fatigue, diaphoresis, activity change, appetite change, fatigue and unexpected weight change.  HENT: Negative.  Negative for neck pain.   Eyes: Negative.  Negative for blurred vision.  Respiratory: Negative for apnea, cough, chest tightness, shortness of breath, wheezing and stridor.   Cardiovascular: Negative for chest pain, palpitations, orthopnea, leg swelling and PND.  Gastrointestinal: Negative for nausea, vomiting, abdominal pain, diarrhea and constipation.  Genitourinary: Negative for dysuria, urgency, frequency, hematuria, flank pain, decreased urine volume, enuresis, difficulty urinating and dyspareunia.  Musculoskeletal: Negative for myalgias, back pain, joint swelling, arthralgias and gait problem.  Skin: Negative for color change, pallor, rash and wound.  Neurological: Negative for dizziness, tremors, seizures, syncope, facial asymmetry, speech difficulty, weakness, light-headedness, numbness and headaches.  Hematological: Negative for adenopathy. Does not bruise/bleed easily.  Psychiatric/Behavioral: Negative.        Objective:   Physical Exam  Constitutional: [name] oriented to person, place, and time. She appears well-developed  and well-nourished. No distress.  HENT:  Head: Normocephalic and atraumatic.  Mouth/Throat: Oropharynx is clear and moist. No oropharyngeal exudate.  Eyes: Conjunctivae are normal. Right eye exhibits no discharge. Left eye exhibits no discharge. No scleral icterus.  Neck: Normal range of motion. Neck supple. No JVD present. No tracheal deviation present. No thyromegaly present.  Cardiovascular: Normal rate, regular rhythm and intact distal pulses.  Exam reveals no gallop and no friction rub.   Murmur heard. Pulmonary/Chest: Effort normal and breath sounds normal. No stridor. No respiratory distress. She has no wheezes. She has no rales. She exhibits no tenderness.  Abdominal: Soft. Bowel sounds are normal. She exhibits no distension and no mass. There is no tenderness. There is no rebound and no guarding.  Musculoskeletal: Normal range of motion. She exhibits no edema and no tenderness.  Lymphadenopathy:    She has no cervical adenopathy.  Neurological: [name] oriented to person, place, and time.  Skin: Skin is warm and dry. No rash noted. [name] not diaphoretic. No erythema. No pallor.  Psychiatric: She has a normal mood and affect. Her behavior is normal. Judgment and thought content normal.     Lab Results  Component Value Date   WBC 7.1 04/19/2011   HGB 13.0 04/19/2011   HCT 39.4 04/19/2011   PLT 191.0 04/19/2011   GLUCOSE 162* 01/10/2011   CHOL 118 04/19/2011   TRIG 134.0 04/19/2011   HDL 53.50 04/19/2011   LDLCALC 38 04/19/2011   ALT 19 04/19/2011   AST 22 04/19/2011   NA 140 01/10/2011   K 4.2 01/10/2011   CL 101 01/10/2011   CREATININE 0.81 01/10/2011   BUN 25* 01/10/2011   CO2 31 01/10/2011   TSH 3.91 04/19/2011   INR 1.01 01/10/2011   HGBA1C 6.6* 04/19/2011       Assessment & Plan:

## 2011-09-10 NOTE — Assessment & Plan Note (Signed)
She tell me that she was recently treated for a UTI so I will check her UA and urine clx today

## 2011-09-10 NOTE — Assessment & Plan Note (Signed)
She has had good rate and rhythm control recently

## 2011-09-10 NOTE — Patient Instructions (Signed)

## 2011-09-10 NOTE — Assessment & Plan Note (Signed)
[name] due for an a1c test today

## 2011-09-12 ENCOUNTER — Encounter: Payer: Self-pay | Admitting: Internal Medicine

## 2011-09-12 MED ORDER — SULFAMETHOXAZOLE-TRIMETHOPRIM 800-160 MG PO TABS: 1.0000 | ORAL_TABLET | Freq: Two times a day (BID) | ORAL | Status: AC

## 2011-09-12 NOTE — Progress Notes (Signed)
Addended by: Etta Grandchild on: 09/12/2011 11:07 AM   Modules accepted: Orders

## 2011-09-14 LAB — CULTURE, URINE COMPREHENSIVE: Colony Count: >=100000

## 2011-09-19 ENCOUNTER — Telehealth: Payer: Self-pay | Admitting: Internal Medicine

## 2011-09-19 NOTE — Telephone Encounter (Signed)
Message copied by Etheleen Sia on Wed Sep 19, 2011 12:44 PM ------      Message from: Etheleen Sia      Created: Wed Sep 19, 2011 12:16 PM       PT IS AWARE / APPT WITH DR Yetta Barre ON MARCH 11      ----- Message -----         From: Etheleen Sia         Sent: 09/18/2011   4:01 PM           To: Etheleen Sia            LMOM 1-29      ----- Message -----         From: Rene Paci, MD         Sent: 09/12/2011   3:54 PM           To: Levin Erp - thanks            ----- Message -----         From: Etheleen Sia         Sent: 09/10/2011   1:40 PM           To: Rene Paci, MD            PT WOULD LIKE TO SWITCH FROM DR LESCHBER TO DR Yetta Barre.  DR Yetta Barre HAS SAID OK.            [phone] (PT'S PHONE)

## 2011-09-26 ENCOUNTER — Telehealth: Payer: Self-pay | Admitting: *Deleted

## 2011-09-26 DIAGNOSIS — E109 Type 1 diabetes mellitus without complications: Secondary | ICD-10-CM

## 2011-09-26 NOTE — Telephone Encounter (Signed)
Received fax from [employer] for [employer] for pt in regards to new insulin pump. Called pt to see if she has requested new pump, pt states that she has because it is getting harder to see information on her screen on old pump. Place paperwork back on desk for MD to order labwork for insulin pump.

## 2011-09-27 NOTE — Telephone Encounter (Signed)
i ordered labs.  Please come in fasting for them

## 2011-09-28 NOTE — Telephone Encounter (Signed)
Pt informed of labs orders. She states that she will come in for labs on the day of her appointment.

## 2011-10-01 ENCOUNTER — Telehealth: Payer: Self-pay | Admitting: *Deleted

## 2011-10-01 ENCOUNTER — Other Ambulatory Visit (INDEPENDENT_AMBULATORY_CARE_PROVIDER_SITE_OTHER): Payer: Medicare Other

## 2011-10-01 DIAGNOSIS — E109 Type 1 diabetes mellitus without complications: Secondary | ICD-10-CM

## 2011-10-01 LAB — BASIC METABOLIC PANEL
GFR: 42.4 mL/min — ABNORMAL LOW (ref >60.00)
Glucose, Bld: 145 mg/dL — ABNORMAL HIGH (ref 70–99)
Potassium: 5.7 mEq/L — ABNORMAL HIGH (ref 3.5–5.1)
Sodium: 141 mEq/L (ref 135–145)

## 2011-10-01 NOTE — Telephone Encounter (Signed)
Lab orders for C-Peptide and Anti-islet cell antibody need to be made future per lab. Orders placed into Epic.

## 2011-10-02 LAB — C-PEPTIDE: C-Peptide: 0.86 ng/mL (ref 0.80–3.90)

## 2011-10-04 ENCOUNTER — Other Ambulatory Visit (INDEPENDENT_AMBULATORY_CARE_PROVIDER_SITE_OTHER): Payer: Medicare Other

## 2011-10-04 ENCOUNTER — Encounter: Payer: Self-pay | Admitting: Internal Medicine

## 2011-10-04 ENCOUNTER — Ambulatory Visit (INDEPENDENT_AMBULATORY_CARE_PROVIDER_SITE_OTHER): Payer: Medicare Other | Admitting: Internal Medicine

## 2011-10-04 DIAGNOSIS — I10 Essential (primary) hypertension: Secondary | ICD-10-CM

## 2011-10-04 DIAGNOSIS — E1129 Type 2 diabetes mellitus with other diabetic kidney complication: Secondary | ICD-10-CM

## 2011-10-04 DIAGNOSIS — N058 Unspecified nephritic syndrome with other morphologic changes: Secondary | ICD-10-CM

## 2011-10-04 DIAGNOSIS — N390 Urinary tract infection, site not specified: Secondary | ICD-10-CM

## 2011-10-04 DIAGNOSIS — E1165 Type 2 diabetes mellitus with hyperglycemia: Secondary | ICD-10-CM

## 2011-10-04 DIAGNOSIS — I4891 Unspecified atrial fibrillation: Secondary | ICD-10-CM

## 2011-10-04 DIAGNOSIS — IMO0002 Reserved for concepts with insufficient information to code with codable children: Secondary | ICD-10-CM

## 2011-10-04 LAB — URINALYSIS, ROUTINE W REFLEX MICROSCOPIC
Ketones, ur: NEGATIVE
Nitrite: NEGATIVE
Specific Gravity, Urine: 1.015 (ref 1.000–1.030)
Urobilinogen, UA: 0.2 (ref 0.0–1.0)
pH: 5.5 (ref 5.0–8.0)

## 2011-10-04 LAB — BASIC METABOLIC PANEL
Calcium: 10.1 mg/dL (ref 8.4–10.5)
GFR: 42.02 mL/min — ABNORMAL LOW (ref >60.00)
Glucose, Bld: 173 mg/dL — ABNORMAL HIGH (ref 70–99)
Potassium: 5.1 mEq/L (ref 3.5–5.1)
Sodium: 141 mEq/L (ref 135–145)

## 2011-10-04 MED ORDER — METOPROLOL TARTRATE 25 MG PO TABS: 50.0000 mg | ORAL_TABLET | Freq: Two times a day (BID) | ORAL | Status: DC

## 2011-10-04 MED ORDER — FUROSEMIDE 40 MG PO TABS: 40.0000 mg | ORAL_TABLET | Freq: Two times a day (BID) | ORAL | Status: DC

## 2011-10-04 NOTE — Patient Instructions (Addendum)
Hypertension As your heart beats, it forces blood through your arteries. This force is your blood pressure. If the pressure is too high, it is called hypertension (HTN) or high blood pressure. HTN is dangerous because you may have it and not know it. High blood pressure may mean that your heart has to work harder to pump blood. Your arteries may be narrow or stiff. The extra work puts you at risk for heart disease, stroke, and other problems.  Blood pressure consists of two numbers, a higher number over a lower, 110/72, for example. It is stated as "110 over 72." The ideal is below 120 for the top number (systolic) and under 80 for the bottom (diastolic). Write down your blood pressure today. You should pay close attention to your blood pressure if you have certain conditions such as:  Heart failure.   Prior heart attack.   Diabetes   Chronic kidney disease.   Prior stroke.   Multiple risk factors for heart disease.  To see if you have HTN, your blood pressure should be measured while you are seated with your arm held at the level of the heart. It should be measured at least twice. A one-time elevated blood pressure reading (especially in the Emergency Department) does not mean that you need treatment. There may be conditions in which the blood pressure is different between your right and left arms. It is important to see your caregiver soon for a recheck. Most people have essential hypertension which means that there is not a specific cause. This type of high blood pressure may be lowered by changing lifestyle factors such as:  Stress.   Smoking.   Lack of exercise.   Excessive weight.   Drug/tobacco/alcohol use.   Eating less salt.  Most people do not have symptoms from high blood pressure until it has caused damage to the body. Effective treatment can often prevent, delay or reduce that damage. TREATMENT  When a cause has been identified, treatment for high blood pressure is  directed at the cause. There are a large number of medications to treat HTN. These fall into several categories, and your caregiver will help you select the medicines that are best for you. Medications may have side effects. You should review side effects with your caregiver. If your blood pressure stays high after you have made lifestyle changes or started on medicines,   Your medication(s) may need to be changed.   Other problems may need to be addressed.   Be certain you understand your prescriptions, and know how and when to take your medicine.   Be sure to follow up with your caregiver within the time frame advised (usually within two weeks) to have your blood pressure rechecked and to review your medications.   If you are taking more than one medicine to lower your blood pressure, make sure you know how and at what times they should be taken. Taking two medicines at the same time can result in blood pressure that is too low.  SEEK IMMEDIATE MEDICAL CARE IF:  You develop a severe headache, blurred or changing vision, or confusion.   You have unusual weakness or numbness, or a faint feeling.   You have severe chest or abdominal pain, vomiting, or breathing problems.  MAKE SURE YOU:   Understand these instructions.   Will watch your condition.   Will get help right away if you are not doing well or get worse.  Document Released: 08/06/2005 Document Revised: 04/18/2011 Document Reviewed:   03/26/2008 ExitCare Patient Information 2012 [employer].Urinary Tract Infection Infections of the urinary tract can start in several places. A bladder infection (cystitis), a kidney infection (pyelonephritis), and a prostate infection (prostatitis) are different types of urinary tract infections (UTIs). They usually get better if treated with medicines (antibiotics) that kill germs. Take all the medicine until it is gone. You or your child may feel better in a few days, but TAKE ALL MEDICINE or the  infection may not respond and may become more difficult to treat. HOME CARE INSTRUCTIONS   Drink enough water and fluids to keep the urine clear or pale yellow. Cranberry juice is especially recommended, in addition to large amounts of water.   Avoid caffeine, tea, and carbonated beverages. They tend to irritate the bladder.   Alcohol may irritate the prostate.   Only take over-the-counter or prescription medicines for pain, discomfort, or fever as directed by your caregiver.  To prevent further infections:  Empty the bladder often. Avoid holding urine for long periods of time.   After a bowel movement, women should cleanse from front to back. Use each tissue only once.   Empty the bladder before and after sexual intercourse.  FINDING OUT THE RESULTS OF YOUR TEST Not all test results are available during your visit. If your or your child's test results are not back during the visit, make an appointment with your caregiver to find out the results. Do not assume everything is normal if you have not heard from your caregiver or the medical facility. It is important for you to follow up on all test results. SEEK MEDICAL CARE IF:   There is back pain.   Your baby is older than 3 months with a rectal temperature of 100.5 F (38.1 C) or higher for more than 1 day.   Your or your child's problems (symptoms) are no better in 3 days. Return sooner if you or your child is getting worse.  SEEK IMMEDIATE MEDICAL CARE IF:   There is severe back pain or lower abdominal pain.   You or your child develops chills.   You have a fever.   Your baby is older than 3 months with a rectal temperature of 102 F (38.9 C) or higher.   Your baby is 3 months old or younger with a rectal temperature of 100.4 F (38 C) or higher.   There is nausea or vomiting.   There is continued burning or discomfort with urination.  MAKE SURE YOU:   Understand these instructions.   Will watch your condition.    Will get help right away if you are not doing well or get worse.  Document Released: 05/16/2005 Document Revised: 04/18/2011 Document Reviewed: 12/19/2006 Exit[employer] Patient Information 2012 [employer]. 

## 2011-10-04 NOTE — Assessment & Plan Note (Signed)
She tells me that she has completed two courses of ? Antibiotic, I will check her UA and ur clx today

## 2011-10-04 NOTE — Progress Notes (Signed)
  Subjective:    Patient ID: Emma Yu, female    DOB: 06/21/1927, 78 y.o.   MRN: [id number]  Hypertension This is a chronic problem. The problem has been gradually worsening since onset. The problem is uncontrolled. Pertinent negatives include no anxiety, blurred vision, chest pain, headaches, malaise/fatigue, neck pain, orthopnea, palpitations, peripheral edema, PND, shortness of breath or sweats. Past treatments include angiotensin blockers, diuretics and beta blockers. The current treatment provides moderate improvement. Compliance problems include exercise and diet.       Review of Systems  Constitutional: Negative.  Negative for malaise/fatigue.  HENT: Negative.  Negative for neck pain.   Eyes: Negative.  Negative for blurred vision.  Respiratory: Negative.  Negative for shortness of breath.   Cardiovascular: Negative.  Negative for chest pain, palpitations, orthopnea and PND.  Gastrointestinal: Negative.   Genitourinary: Negative.   Musculoskeletal: Negative.   Skin: Negative.   Neurological: Negative.  Negative for headaches.  Hematological: Negative.   Psychiatric/Behavioral: Negative.        Objective:   Physical Exam  Constitutional: [name] oriented to person, place, and time. She appears well-developed and well-nourished. No distress.  HENT:  Head: Normocephalic and atraumatic.  Mouth/Throat: Oropharynx is clear and moist. No oropharyngeal exudate.  Eyes: Conjunctivae are normal. Right eye exhibits no discharge. Left eye exhibits no discharge. No scleral icterus.  Neck: Normal range of motion. Neck supple. No JVD present. No tracheal deviation present. No thyromegaly present.  Cardiovascular: Normal rate, regular rhythm and normal heart sounds.   Pulmonary/Chest: Effort normal and breath sounds normal. No stridor. No respiratory distress. She has no wheezes. She has no rales. She exhibits no tenderness.  Abdominal: Soft. Bowel sounds are normal. She exhibits no  distension and no mass. There is no tenderness. There is no rebound and no guarding.  Musculoskeletal: Normal range of motion. She exhibits no edema and no tenderness.  Lymphadenopathy:    She has no cervical adenopathy.  Neurological: [name] oriented to person, place, and time.  Skin: Skin is warm and dry. No rash noted. [name] not diaphoretic. No erythema. No pallor.  Psychiatric: She has a normal mood and affect. Her behavior is normal. Judgment and thought content normal.      Lab Results  Component Value Date   WBC 7.6 09/10/2011   HGB 12.2 09/10/2011   HCT 36.6 09/10/2011   PLT 199.0 09/10/2011   GLUCOSE 145* 10/01/2011   CHOL 109 09/10/2011   TRIG 121.0 09/10/2011   HDL 49.90 09/10/2011   LDLCALC 35 09/10/2011   ALT 30 09/10/2011   AST 24 09/10/2011   NA 141 10/01/2011   K 5.7* 10/01/2011   CL 106 10/01/2011   CREATININE 1.3* 10/01/2011   BUN 42* 10/01/2011   CO2 27 10/01/2011   TSH 1.66 09/10/2011   INR 1.01 01/10/2011   HGBA1C 6.0 09/10/2011     Assessment & Plan:

## 2011-10-04 NOTE — Assessment & Plan Note (Signed)
I will increase her lasix to BID for better BP control and will recheck her lytes today

## 2011-10-06 LAB — CULTURE, URINE COMPREHENSIVE: Colony Count: >=100000

## 2011-10-09 LAB — ANTI-ISLET CELL ANTIBODY: Pancreatic Islet Cell Antibody: <5 JDF Units (ref <5)

## 2011-10-10 ENCOUNTER — Encounter: Payer: Self-pay | Admitting: Internal Medicine

## 2011-10-10 MED ORDER — NITROFURANTOIN MONOHYD MACRO 100 MG PO CAPS: 100.0000 mg | ORAL_CAPSULE | Freq: Two times a day (BID) | ORAL | Status: AC

## 2011-10-10 NOTE — Progress Notes (Signed)
Addended by: Etta Grandchild on: 10/10/2011 08:47 PM   Modules accepted: Orders

## 2011-10-22 ENCOUNTER — Ambulatory Visit (INDEPENDENT_AMBULATORY_CARE_PROVIDER_SITE_OTHER): Payer: Medicare Other | Admitting: Endocrinology

## 2011-10-22 ENCOUNTER — Encounter: Payer: Self-pay | Admitting: Endocrinology

## 2011-10-22 DIAGNOSIS — E1059 Type 1 diabetes mellitus with other circulatory complications: Secondary | ICD-10-CM

## 2011-10-22 DIAGNOSIS — I739 Peripheral vascular disease, unspecified: Secondary | ICD-10-CM

## 2011-10-22 DIAGNOSIS — I798 Other disorders of arteries, arterioles and capillaries in diseases classified elsewhere: Secondary | ICD-10-CM

## 2011-10-22 DIAGNOSIS — E1051 Type 1 diabetes mellitus with diabetic peripheral angiopathy without gangrene: Secondary | ICD-10-CM | POA: Insufficient documentation

## 2011-10-22 NOTE — Progress Notes (Signed)
Subjective:    Patient ID: Emma Yu, female    DOB: 1927-07-18, 78 y.o.   MRN: [id number]  HPI Pt returns for f/u of type 1 dm (1999).  She continues to exceed the prescribed insulin pump dosages.  she brings a record of her cbg's which i have reviewed today.  It varies from 77-226, but most are in the low-100's.  There is no trend throughout the day.  She did not believe her last a1c, and wants to get it checked at [facility].  Past Medical History  Diagnosis Date  . PAD (peripheral artery disease)     s/p bilateral comon iliac artery stenting in 2002. Known significant  R SFA  disease  . Aortic stenosis     Mild  . Diabetes mellitus   . Hypertension   . Hyperlipidemia   . Venous insufficiency   . CHF (congestive heart failure)     normal EF  . Atrial fib/flutter, transient   . Atrial fibrillation     post op  . DJD (degenerative joint disease) of hip     s/p R THR 10/2010    Past Surgical History  Procedure Date  . Total hip arthroplasty 1991, 1994    redo in 1994  . Lumbar disc surgery 1999  . Cardiac catheterization 01/10/2011    No significant CAD    History   Social History  . Marital Status: Widowed    Spouse Name: N/A    Number of Children: N/A  . Years of Education: N/A   Occupational History  .      retired   Social History Main Topics  . Smoking status: Never Smoker   . Smokeless tobacco: Never Used   Comment: Retired-widowed 199, lives with son  . Alcohol Use: No  . Drug Use: No  . Sexually Active: Not Currently   Other Topics Concern  . Not on file   Social History Narrative   Lives with sonWidowed 1991Quit smoking in 1987    Current Outpatient Prescriptions on File Prior to Visit  Medication Sig Dispense Refill  . acetaminophen (TYLENOL) 325 MG tablet Take 325 mg by mouth once as needed. As needed for pain.      . Ascorbic Acid (VITAMIN C) 500 MG tablet Take 500 mg by mouth daily.        Marland Kitchen aspirin 81 MG tablet Take 2 at bedtime        . b complex vitamins tablet Take 1 tablet by mouth daily.        . Cholecalciferol (VITAMIN D3) 2000 UNITS capsule Take 2,000 Units by mouth daily.        . Cinnamon 500 MG capsule 4 capsules daily       . cloNIDine (CATAPRES) 0.2 MG tablet Take 1 tablet (0.2 mg total) by mouth 4 (four) times daily.      . Coenzyme Q10 10 MG capsule (300mg  caps) 1 capsule by mouth daily       . fish oil-omega-3 fatty acids 1000 MG capsule Take 2 g by mouth daily.        . furosemide (LASIX) 40 MG tablet Take 1 tablet (40 mg total) by mouth 2 (two) times daily.  60 tablet  3  . gabapentin (NEURONTIN) 300 MG capsule Take 2 capsules (600 mg total) by mouth at bedtime.  180 capsule  1  . insulin lispro (HUMALOG) 100 UNIT/ML injection use as directed for insulin pump--total of 80 units/day  80 mL  12  . methocarbamol (ROBAXIN) 500 MG tablet Take 1 tablet (500 mg total) by mouth 3 (three) times daily as needed.  90 tablet  1  . metoprolol tartrate (LOPRESSOR) 25 MG tablet Take 2 tablets (50 mg total) by mouth 2 (two) times daily.  180 tablet  3  . naproxen sodium (ALEVE) 220 MG tablet Take 220 mg by mouth as needed. For pain      . omeprazole (PRILOSEC) 40 MG capsule Take 1 capsule (40 mg total) by mouth daily.  90 capsule  1  . pravastatin (PRAVACHOL) 20 MG tablet Take 1 tablet (20 mg total) by mouth every evening.  90 tablet  1  . traMADol (ULTRAM) 50 MG tablet Take 1 tablet (50 mg total) by mouth every 6 (six) hours as needed.  120 tablet  0  . valsartan (DIOVAN) 320 MG tablet Take 1 tablet (320 mg total) by mouth at bedtime.  90 tablet  3    Allergies  Allergen Reactions  . Carvedilol Other (See Comments)    Heart stops  . Diltiazem Hcl   . Fenofibrate   . Hctz (Hydrochlorothiazide)   . Nisoldipine   . Nisoldipine   . Propranolol Diarrhea  . Tekturna (Aliskiren Fumarate)   . Valturna (Aliskiren-Valsartan)     Family History  Problem Relation Age of Onset  . Diabetes Father   . Diabetes Other      5/8 sibs    BP 142/58  Pulse 65  Temp(Src) 98.2 F (36.8 C) (Oral)  Wt 146 lb (66.225 kg)  SpO2 94%   Review of Systems denies hypoglycemia    Objective:   Physical Exam VITAL SIGNS:  See vs page GENERAL: no distress Pulses: dorsalis pedis are absent bilat.  Feet: no deformity. no ulcer on the feet. feet are of normal color and temp. no edema. There is mild erythema of the legs. There are bilat varicosities. There is bilateral onychomycosis.   Neuro: sensation is intact to touch on the feet.   Lab Results  Component Value Date   HGBA1C 6.0 09/10/2011      Assessment & Plan:  Type 1 DM, overcontrolled.  therapy limited by persistent noncompliance with insulin dosing.  This is especially dangerous in pt with h/o CAD.  i'll do the best i can.

## 2011-10-22 NOTE — Patient Instructions (Addendum)
please reduce basal rate to 0.9 unit/hr, 24 hrs per day.   Take a bolus of 16 units with each meal.   For your safety, you should not exceed the prescribed amounts continue correction bolus (which some people call "sensitivity," or "insulin sensitivity ratio," or just "isr") of 1 unit for each by 25 which your glucose exceeds 120. check your blood sugar 4 times a day--before the 3 meals, and at bedtime.  also check if you have symptoms of your blood sugar being too high or too low.  please keep a record of the readings and bring it to your next appointment here.  please call us sooner if you are having low blood sugar episodes. Please make a follow-up appointment in 3 months.

## 2011-10-29 ENCOUNTER — Ambulatory Visit: Payer: Medicare Other | Admitting: Internal Medicine

## 2011-10-29 ENCOUNTER — Ambulatory Visit: Payer: Medicare Other | Admitting: Endocrinology

## 2011-10-31 ENCOUNTER — Other Ambulatory Visit: Payer: Medicare Other

## 2011-10-31 ENCOUNTER — Encounter: Payer: Self-pay | Admitting: Internal Medicine

## 2011-10-31 ENCOUNTER — Ambulatory Visit (INDEPENDENT_AMBULATORY_CARE_PROVIDER_SITE_OTHER): Payer: Medicare Other | Admitting: Internal Medicine

## 2011-10-31 VITALS — BP 132/82 | HR 60 | Temp 97.3°F | Resp 16 | Wt 150.0 lb

## 2011-10-31 DIAGNOSIS — E1059 Type 1 diabetes mellitus with other circulatory complications: Secondary | ICD-10-CM

## 2011-10-31 DIAGNOSIS — I739 Peripheral vascular disease, unspecified: Secondary | ICD-10-CM

## 2011-10-31 DIAGNOSIS — I798 Other disorders of arteries, arterioles and capillaries in diseases classified elsewhere: Secondary | ICD-10-CM

## 2011-10-31 DIAGNOSIS — N390 Urinary tract infection, site not specified: Secondary | ICD-10-CM

## 2011-10-31 DIAGNOSIS — M1990 Unspecified osteoarthritis, unspecified site: Secondary | ICD-10-CM

## 2011-10-31 DIAGNOSIS — E7800 Pure hypercholesterolemia, unspecified: Secondary | ICD-10-CM

## 2011-10-31 DIAGNOSIS — I10 Essential (primary) hypertension: Secondary | ICD-10-CM

## 2011-10-31 DIAGNOSIS — E1051 Type 1 diabetes mellitus with diabetic peripheral angiopathy without gangrene: Secondary | ICD-10-CM

## 2011-10-31 MED ORDER — GABAPENTIN 300 MG PO CAPS: 600.0000 mg | ORAL_CAPSULE | Freq: Every day | ORAL | Status: DC

## 2011-10-31 MED ORDER — OMEPRAZOLE 40 MG PO CPDR: 40.0000 mg | DELAYED_RELEASE_CAPSULE | Freq: Every day | ORAL | Status: DC

## 2011-10-31 MED ORDER — PRAVASTATIN SODIUM 20 MG PO TABS: 20.0000 mg | ORAL_TABLET | Freq: Every evening | ORAL | Status: DC

## 2011-10-31 MED ORDER — CLONIDINE HCL 0.2 MG PO TABS: 0.2000 mg | ORAL_TABLET | Freq: Four times a day (QID) | ORAL | Status: DC

## 2011-10-31 MED ORDER — DICLOFENAC SODIUM 1 % TD GEL: 1.0000 "application " | Freq: Four times a day (QID) | TRANSDERMAL | Status: DC

## 2011-10-31 MED ORDER — FUROSEMIDE 40 MG PO TABS: 40.0000 mg | ORAL_TABLET | Freq: Two times a day (BID) | ORAL | Status: DC

## 2011-10-31 MED ORDER — METOPROLOL TARTRATE 25 MG PO TABS: 50.0000 mg | ORAL_TABLET | Freq: Two times a day (BID) | ORAL | Status: DC

## 2011-10-31 MED ORDER — VALSARTAN 320 MG PO TABS: 320.0000 mg | ORAL_TABLET | Freq: Every day | ORAL | Status: DC

## 2011-10-31 MED ORDER — INSULIN LISPRO 100 UNIT/ML ~~LOC~~ SOLN: SUBCUTANEOUS | Status: DC

## 2011-10-31 NOTE — Progress Notes (Signed)
  Subjective:    Patient ID: Emma Yu, female    DOB: [birth year], 79 y.o.   MRN: [id number]  Hypertension This is a chronic problem. The current episode started more than 1 year ago. The problem is unchanged. The problem is controlled. Pertinent negatives include no anxiety, blurred vision, chest pain, headaches, malaise/fatigue, neck pain, orthopnea, palpitations, peripheral edema, PND, shortness of breath or sweats. Past treatments include angiotensin blockers, beta blockers and diuretics. The current treatment provides moderate improvement. There are no compliance problems.  Hypertensive end-organ damage includes CAD/MI and PVD.      Review of Systems  Constitutional: Negative for fever, chills, malaise/fatigue, diaphoresis, activity change, appetite change, fatigue and unexpected weight change.  HENT: Negative.  Negative for neck pain.   Eyes: Negative.  Negative for blurred vision.  Respiratory: Negative for apnea, cough, choking, chest tightness, shortness of breath, wheezing and stridor.   Cardiovascular: Negative for chest pain, palpitations, orthopnea, leg swelling and PND.  Gastrointestinal: Negative for nausea, vomiting, abdominal pain, diarrhea, constipation and blood in stool.  Genitourinary: Negative for dysuria, urgency, frequency, hematuria, flank pain, decreased urine volume, enuresis, difficulty urinating and dyspareunia.  Musculoskeletal: Negative.   Skin: Negative.   Neurological: Negative for dizziness, tremors, seizures, syncope, facial asymmetry, speech difficulty, weakness, light-headedness, numbness and headaches.  Hematological: Negative for adenopathy. Does not bruise/bleed easily.  Psychiatric/Behavioral: Negative.        Objective:   Physical Exam  Vitals reviewed. Constitutional: [name] oriented to person, place, and time. She appears well-developed and well-nourished. No distress.  HENT:  Head: Normocephalic and atraumatic.  Nose: Nose normal.    Mouth/Throat: Oropharynx is clear and moist. No oropharyngeal exudate.  Eyes: Conjunctivae are normal. Right eye exhibits no discharge. Left eye exhibits no discharge. No scleral icterus.  Neck: Normal range of motion. Neck supple. No JVD present. No tracheal deviation present. No thyromegaly present.  Cardiovascular: Normal rate, regular rhythm and intact distal pulses.  Exam reveals no gallop and no friction rub.   Murmur heard. Pulmonary/Chest: Effort normal and breath sounds normal. No stridor. No respiratory distress. She has no wheezes. She has no rales. She exhibits no tenderness.  Abdominal: Soft. Bowel sounds are normal. She exhibits no distension and no mass. There is no tenderness. There is no rebound and no guarding.  Musculoskeletal: Normal range of motion. She exhibits no edema and no tenderness.  Lymphadenopathy:    She has no cervical adenopathy.  Neurological: [name] oriented to person, place, and time.  Skin: Skin is warm and dry. No rash noted. [name] not diaphoretic. No erythema. No pallor.  Psychiatric: She has a normal mood and affect. Her behavior is normal. Judgment and thought content normal.       Lab Results  Component Value Date   WBC 7.6 09/10/2011   HGB 12.2 09/10/2011   HCT 36.6 09/10/2011   PLT 199.0 09/10/2011   GLUCOSE 173* 10/04/2011   CHOL 109 09/10/2011   TRIG 121.0 09/10/2011   HDL 49.90 09/10/2011   LDLCALC 35 09/10/2011   ALT 30 09/10/2011   AST 24 09/10/2011   NA 141 10/04/2011   K 5.1 10/04/2011   CL 106 10/04/2011   CREATININE 1.3* 10/04/2011   BUN 39* 10/04/2011   CO2 28 10/04/2011   TSH 1.66 09/10/2011   INR 1.01 01/10/2011   HGBA1C 6.0 09/10/2011     Assessment & Plan:

## 2011-10-31 NOTE — Patient Instructions (Signed)

## 2011-11-02 ENCOUNTER — Ambulatory Visit: Payer: Medicare Other | Admitting: Endocrinology

## 2011-11-02 ENCOUNTER — Encounter: Payer: Self-pay | Admitting: Internal Medicine

## 2011-11-02 LAB — CULTURE, URINE COMPREHENSIVE: Colony Count: NO GROWTH

## 2011-11-02 NOTE — Assessment & Plan Note (Signed)
I will recheck her urine clx today 

## 2011-11-02 NOTE — Assessment & Plan Note (Signed)
managed by ENDO

## 2011-11-02 NOTE — Assessment & Plan Note (Signed)
Her BP is well controlled 

## 2011-11-26 ENCOUNTER — Encounter: Payer: Self-pay | Admitting: Cardiovascular Disease

## 2011-11-26 ENCOUNTER — Encounter (INDEPENDENT_AMBULATORY_CARE_PROVIDER_SITE_OTHER): Payer: Medicare Other

## 2011-11-26 ENCOUNTER — Ambulatory Visit (INDEPENDENT_AMBULATORY_CARE_PROVIDER_SITE_OTHER): Payer: Medicare Other | Admitting: Cardiovascular Disease

## 2011-11-26 VITALS — BP 110/52 | HR 64 | Ht 60.0 in | Wt 146.0 lb

## 2011-11-26 DIAGNOSIS — I798 Other disorders of arteries, arterioles and capillaries in diseases classified elsewhere: Secondary | ICD-10-CM

## 2011-11-26 DIAGNOSIS — I6529 Occlusion and stenosis of unspecified carotid artery: Secondary | ICD-10-CM

## 2011-11-26 DIAGNOSIS — I872 Venous insufficiency (chronic) (peripheral): Secondary | ICD-10-CM

## 2011-11-26 DIAGNOSIS — I10 Essential (primary) hypertension: Secondary | ICD-10-CM

## 2011-11-26 DIAGNOSIS — R0989 Other specified symptoms and signs involving the circulatory and respiratory systems: Secondary | ICD-10-CM | POA: Insufficient documentation

## 2011-11-26 DIAGNOSIS — E1051 Type 1 diabetes mellitus with diabetic peripheral angiopathy without gangrene: Secondary | ICD-10-CM

## 2011-11-26 DIAGNOSIS — R259 Unspecified abnormal involuntary movements: Secondary | ICD-10-CM

## 2011-11-26 DIAGNOSIS — E1059 Type 1 diabetes mellitus with other circulatory complications: Secondary | ICD-10-CM

## 2011-11-26 DIAGNOSIS — E7800 Pure hypercholesterolemia, unspecified: Secondary | ICD-10-CM

## 2011-11-26 DIAGNOSIS — I739 Peripheral vascular disease, unspecified: Secondary | ICD-10-CM

## 2011-11-26 DIAGNOSIS — R251 Tremor, unspecified: Secondary | ICD-10-CM

## 2011-11-26 NOTE — Assessment & Plan Note (Signed)
Insulin pump.  Home readings still seem high.  F/U Dr Everardo All

## 2011-11-26 NOTE — Assessment & Plan Note (Signed)
Stable Low sodium diet.  As needed diuretics

## 2011-11-26 NOTE — Assessment & Plan Note (Signed)
F/U Dr [facility] Charon No evidence of Parkinsons.  ? diarhea with inderal

## 2011-11-26 NOTE — Progress Notes (Signed)
This is an 78 year old female who has been followed by [facility] today for a followup visit. C ardiac catheterization 01/11/11 which overall showed no significant coronary artery disease. [name] doing reasonably well. The dose of Lasix was increased recently do to increased leg edema. I also recently decreased the dose of amlodipine to 2.5 mg once daily for the same reason. The dose of Neurontin was increased Reviewed BP records from home and acceptable in the 130-145 systolic range. On 5 meds with calcified senile medial vessel disease. Diabetic with insulin pump followed by Ever[facility] All. New sliding scale recently. Legs hurt. Multifactorial. No documented PVD. Stopped Pletal due to running BS higher. Clear neuropathy. Ambulation limited by back pain. LE edema improved. No nonhealing ulcers. Chronic venous insuf. Changes. New UE tremors. History of tremors in family. Being evaluated by [facility] Charon for Parkinsons. No SSCP, palpitations, dyspnea or syncope  Reviewed carotids from today  40-59% bilateral ICA stenosis  ROS: Denies fever, malais, weight loss, blurry vision, decreased visual acuity, cough, sputum, SOB, hemoptysis, pleuritic pain, palpitaitons, heartburn, abdominal pain, melena, lower extremity edema, claudication, or rash.  All other systems reviewed and negative  General: Affect appropriate Elderly female with UE tremors HEENT: normal Neck supple with no adenopathy JVP normal bilateral  bruits no thyromegaly Lungs clear with no wheezing and good diaphragmatic motion Heart:  S1/S2 SEM murmur, no rub, gallop or click PMI normal Abdomen: benighn, BS positve, no tenderness, no AAA no bruit.  No HSM or HJR Distal pulses intact with no bruits Plus one bilateral edema Neuro non-focal Skin warm and dry No muscular weakness   Current Outpatient Prescriptions  Medication Sig Dispense Refill  . acetaminophen (TYLENOL) 325 MG tablet Take 325 mg by mouth once as needed. As needed for pain.      .  Ascorbic Acid (VITAMIN C) 500 MG tablet Take 500 mg by mouth daily.        Marland Kitchen aspirin 81 MG tablet Take 2 at bedtime       . b complex vitamins tablet Take 1 tablet by mouth daily.        . Cholecalciferol (VITAMIN D3) 2000 UNITS capsule Take 2,000 Units by mouth daily.        . Cinnamon 500 MG capsule 4 capsules daily       . cloNIDine (CATAPRES) 0.2 MG tablet Take 1 tablet (0.2 mg total) by mouth 4 (four) times daily.  360 tablet  3  . Coenzyme Q10 10 MG capsule (300mg  caps) 1 capsule by mouth daily       . diclofenac sodium (VOLTAREN) 1 % GEL Apply 1 application topically 4 (four) times daily.  100 g  11  . fish oil-omega-3 fatty acids 1000 MG capsule Take 2 g by mouth daily.        . furosemide (LASIX) 40 MG tablet Take 1 tablet (40 mg total) by mouth 2 (two) times daily.  180 tablet  3  . gabapentin (NEURONTIN) 300 MG capsule Take 2 capsules (600 mg total) by mouth at bedtime.  180 capsule  3  . Insulin Infusion Pump Supplies (INSULIN PUMP SYRINGE RESERVOIR) MISC 1 Device by Does not apply route every 3 (three) days.      . Insulin Infusion Pump Supplies (QUICK-SET INFUSION 23" ) MISC 1 Device by Does not apply route every 3 (three) days.      . insulin lispro (HUMALOG) 100 UNIT/ML injection use as directed for insulin pump--total of 80 units/day  80 mL  12  . methocarbamol (ROBAXIN) 500 MG tablet Take 1 tablet (500 mg total) by mouth 3 (three) times daily as needed.  90 tablet  1  . metoprolol tartrate (LOPRESSOR) 25 MG tablet Take 2 tablets (50 mg total) by mouth 2 (two) times daily.  360 tablet  3  . omeprazole (PRILOSEC) 40 MG capsule Take 1 capsule (40 mg total) by mouth daily.  90 capsule  3  . pravastatin (PRAVACHOL) 20 MG tablet Take 1 tablet (20 mg total) by mouth every evening.  90 tablet  3  . traMADol (ULTRAM) 50 MG tablet Take 1 tablet (50 mg total) by mouth every 6 (six) hours as needed.  120 tablet  0  . valsartan (DIOVAN) 320 MG tablet Take 1 tablet (320 mg total) by mouth  at bedtime.  90 tablet  3    Allergies  Carvedilol; Diltiazem hcl; Fenofibrate; Hctz; Nisoldipine; Nisoldipine; Propranolol; Derl Barrow  Electrocardiogram:  Assessment and Plan

## 2011-11-26 NOTE — Assessment & Plan Note (Signed)
Cholesterol is at goal.  Continue current dose of statin and diet Rx.  No myalgias or side effects.  F/U  LFT's in 6 months. Lab Results  Component Value Date   LDLCALC 35 09/10/2011             

## 2011-11-26 NOTE — Assessment & Plan Note (Signed)
Well controlled.  Continue current medications and low sodium Dash type diet.    

## 2011-11-26 NOTE — Patient Instructions (Signed)
Your physician wants you to follow-up in: YEAR WITH DR Eden Emms CAROTID SAME DAY  You will receive a reminder letter in the mail two months in advance. If you don't receive a letter, please call our office to schedule the follow-up appointment. Your physician recommends that you continue on your current medications as directed. Please refer to the Current Medication list given to you today. Your physician has requested that you have a carotid duplex. This test is an ultrasound of the carotid arteries in your neck. It looks at blood flow through these arteries that supply the brain with blood. Allow one hour for this exam. There are no restrictions or special instructions. DUE  IN YEAR  SEE DR Eden Emms SAME DAY

## 2011-11-26 NOTE — Assessment & Plan Note (Signed)
Duplex today 40-59% bilateral disease.  F/U duplex in a year  ASA

## 2011-12-04 ENCOUNTER — Telehealth: Payer: Self-pay | Admitting: Cardiovascular Disease

## 2011-12-04 NOTE — Telephone Encounter (Signed)
PT'S GRANDSON  AWARE OF  CAROTID RESULTS./CY

## 2011-12-04 NOTE — Telephone Encounter (Signed)
LMTCB ./CY 

## 2011-12-04 NOTE — Telephone Encounter (Signed)
Patient returning nurse CY call, she can be reached at [phone].

## 2011-12-08 ENCOUNTER — Inpatient Hospital Stay (HOSPITAL_COMMUNITY)
Admission: EM | Admit: 2011-12-08 | Discharge: 2011-12-18 | DRG: 683 | Disposition: A | Discharge status: Skilled Nursing Facility | Payer: Medicare Other | Source: Ambulatory Visit | Attending: Internal Medicine | Admitting: Internal Medicine

## 2011-12-08 ENCOUNTER — Encounter (HOSPITAL_COMMUNITY): Payer: Self-pay | Admitting: *Deleted

## 2011-12-08 DIAGNOSIS — IMO0002 Reserved for concepts with insufficient information to code with codable children: Secondary | ICD-10-CM | POA: Diagnosis present

## 2011-12-08 DIAGNOSIS — N390 Urinary tract infection, site not specified: Secondary | ICD-10-CM

## 2011-12-08 DIAGNOSIS — I872 Venous insufficiency (chronic) (peripheral): Secondary | ICD-10-CM

## 2011-12-08 DIAGNOSIS — S0990XA Unspecified injury of head, initial encounter: Secondary | ICD-10-CM

## 2011-12-08 DIAGNOSIS — E860 Dehydration: Secondary | ICD-10-CM

## 2011-12-08 DIAGNOSIS — M169 Osteoarthritis of hip, unspecified: Secondary | ICD-10-CM | POA: Diagnosis present

## 2011-12-08 DIAGNOSIS — F0390 Unspecified dementia without behavioral disturbance: Secondary | ICD-10-CM | POA: Diagnosis present

## 2011-12-08 DIAGNOSIS — I2510 Atherosclerotic heart disease of native coronary artery without angina pectoris: Secondary | ICD-10-CM

## 2011-12-08 DIAGNOSIS — R259 Unspecified abnormal involuntary movements: Secondary | ICD-10-CM | POA: Diagnosis present

## 2011-12-08 DIAGNOSIS — Z833 Family history of diabetes mellitus: Secondary | ICD-10-CM

## 2011-12-08 DIAGNOSIS — Z9641 Presence of insulin pump (external) (internal): Secondary | ICD-10-CM

## 2011-12-08 DIAGNOSIS — Z794 Long term (current) use of insulin: Secondary | ICD-10-CM

## 2011-12-08 DIAGNOSIS — I951 Orthostatic hypotension: Secondary | ICD-10-CM

## 2011-12-08 DIAGNOSIS — E785 Hyperlipidemia, unspecified: Secondary | ICD-10-CM | POA: Diagnosis present

## 2011-12-08 DIAGNOSIS — Z96649 Presence of unspecified artificial hip joint: Secondary | ICD-10-CM

## 2011-12-08 DIAGNOSIS — R197 Diarrhea, unspecified: Secondary | ICD-10-CM

## 2011-12-08 DIAGNOSIS — M1610 Unilateral primary osteoarthritis, unspecified hip: Secondary | ICD-10-CM | POA: Diagnosis present

## 2011-12-08 DIAGNOSIS — E875 Hyperkalemia: Secondary | ICD-10-CM

## 2011-12-08 DIAGNOSIS — Z7982 Long term (current) use of aspirin: Secondary | ICD-10-CM

## 2011-12-08 DIAGNOSIS — I5030 Unspecified diastolic (congestive) heart failure: Secondary | ICD-10-CM | POA: Diagnosis present

## 2011-12-08 DIAGNOSIS — I509 Heart failure, unspecified: Secondary | ICD-10-CM | POA: Diagnosis present

## 2011-12-08 DIAGNOSIS — I10 Essential (primary) hypertension: Secondary | ICD-10-CM

## 2011-12-08 DIAGNOSIS — R0989 Other specified symptoms and signs involving the circulatory and respiratory systems: Secondary | ICD-10-CM

## 2011-12-08 DIAGNOSIS — I70209 Unspecified atherosclerosis of native arteries of extremities, unspecified extremity: Secondary | ICD-10-CM | POA: Diagnosis present

## 2011-12-08 DIAGNOSIS — N19 Unspecified kidney failure: Secondary | ICD-10-CM

## 2011-12-08 DIAGNOSIS — R9439 Abnormal result of other cardiovascular function study: Secondary | ICD-10-CM

## 2011-12-08 DIAGNOSIS — R5381 Other malaise: Secondary | ICD-10-CM | POA: Diagnosis present

## 2011-12-08 DIAGNOSIS — E7800 Pure hypercholesterolemia, unspecified: Secondary | ICD-10-CM

## 2011-12-08 DIAGNOSIS — I080 Rheumatic disorders of both mitral and aortic valves: Secondary | ICD-10-CM | POA: Diagnosis present

## 2011-12-08 DIAGNOSIS — I4891 Unspecified atrial fibrillation: Secondary | ICD-10-CM

## 2011-12-08 DIAGNOSIS — Z888 Allergy status to other drugs, medicaments and biological substances status: Secondary | ICD-10-CM

## 2011-12-08 DIAGNOSIS — M1990 Unspecified osteoarthritis, unspecified site: Secondary | ICD-10-CM

## 2011-12-08 DIAGNOSIS — I798 Other disorders of arteries, arterioles and capillaries in diseases classified elsewhere: Secondary | ICD-10-CM | POA: Diagnosis present

## 2011-12-08 DIAGNOSIS — E1051 Type 1 diabetes mellitus with diabetic peripheral angiopathy without gangrene: Secondary | ICD-10-CM

## 2011-12-08 DIAGNOSIS — R251 Tremor, unspecified: Secondary | ICD-10-CM | POA: Diagnosis present

## 2011-12-08 DIAGNOSIS — N179 Acute kidney failure, unspecified: Principal | ICD-10-CM

## 2011-12-08 DIAGNOSIS — I959 Hypotension, unspecified: Secondary | ICD-10-CM | POA: Diagnosis present

## 2011-12-08 DIAGNOSIS — I739 Peripheral vascular disease, unspecified: Secondary | ICD-10-CM

## 2011-12-08 DIAGNOSIS — I050 Rheumatic mitral stenosis: Secondary | ICD-10-CM

## 2011-12-08 DIAGNOSIS — D649 Anemia, unspecified: Secondary | ICD-10-CM

## 2011-12-08 DIAGNOSIS — Z79899 Other long term (current) drug therapy: Secondary | ICD-10-CM

## 2011-12-08 DIAGNOSIS — I350 Nonrheumatic aortic (valve) stenosis: Secondary | ICD-10-CM

## 2011-12-08 DIAGNOSIS — E1059 Type 1 diabetes mellitus with other circulatory complications: Secondary | ICD-10-CM | POA: Diagnosis present

## 2011-12-08 DIAGNOSIS — Z9181 History of falling: Secondary | ICD-10-CM

## 2011-12-08 HISTORY — DX: Atherosclerotic heart disease of native coronary artery without angina pectoris: I25.10

## 2011-12-08 HISTORY — DX: Rheumatic mitral stenosis: I05.0

## 2011-12-08 MED ORDER — SODIUM CHLORIDE 0.9 % IV BOLUS (SEPSIS)
500.0000 mL | Freq: Once | INTRAVENOUS | Status: AC
  Administered 2011-12-09: 500 mL via INTRAVENOUS

## 2011-12-08 NOTE — ED Notes (Signed)
She has had multiple falls and she fell earlier today and lacerated her head and was seen at [facility] ed and had sutures to her scalp.  She fell again and the family brought her  Here.  The pt is alert but pale.  She says her knees have been doubling up on her

## 2011-12-09 ENCOUNTER — Encounter (HOSPITAL_COMMUNITY): Payer: Self-pay | Admitting: Emergency Medicine

## 2011-12-09 ENCOUNTER — Other Ambulatory Visit: Payer: Self-pay

## 2011-12-09 ENCOUNTER — Inpatient Hospital Stay (HOSPITAL_COMMUNITY): Payer: Medicare Other

## 2011-12-09 DIAGNOSIS — I959 Hypotension, unspecified: Secondary | ICD-10-CM | POA: Diagnosis present

## 2011-12-09 DIAGNOSIS — R197 Diarrhea, unspecified: Secondary | ICD-10-CM | POA: Diagnosis present

## 2011-12-09 DIAGNOSIS — E875 Hyperkalemia: Secondary | ICD-10-CM | POA: Diagnosis present

## 2011-12-09 DIAGNOSIS — E860 Dehydration: Secondary | ICD-10-CM | POA: Diagnosis present

## 2011-12-09 LAB — URINE MICROSCOPIC-ADD ON

## 2011-12-09 LAB — CBC
HCT: 28.5 % — ABNORMAL LOW (ref 36.0–46.0)
Hemoglobin: 9.2 g/dL — ABNORMAL LOW (ref 12.0–15.0)
Hemoglobin: 8.6 g/dL — ABNORMAL LOW (ref 12.0–15.0)
MCH: 30.4 pg (ref 26.0–34.0)
MCH: 31.2 pg (ref 26.0–34.0)
MCHC: 32.3 g/dL (ref 30.0–36.0)
MCHC: 32.7 g/dL (ref 30.0–36.0)
MCV: 94.1 fL (ref 78.0–100.0)
Platelets: 155 10*3/uL (ref 150–400)
Platelets: 132 10*3/uL — ABNORMAL LOW (ref 150–400)
RBC: 3.03 MIL/uL — ABNORMAL LOW (ref 3.87–5.11)
RBC: 2.76 MIL/uL — ABNORMAL LOW (ref 3.87–5.11)
RDW: 15.5 % (ref 11.5–15.5)
WBC: 9.1 10*3/uL (ref 4.0–10.5)

## 2011-12-09 LAB — BASIC METABOLIC PANEL
Calcium: 9.1 mg/dL (ref 8.4–10.5)
Calcium: 8.6 mg/dL (ref 8.4–10.5)
Creatinine, Ser: 3.06 mg/dL — ABNORMAL HIGH (ref 0.50–1.10)
GFR calc Af Amer: 20 mL/min — ABNORMAL LOW (ref >90)
GFR calc non Af Amer: 13 mL/min — ABNORMAL LOW (ref >90)
GFR calc non Af Amer: 17 mL/min — ABNORMAL LOW (ref >90)
Potassium: 5.1 mEq/L (ref 3.5–5.1)
Sodium: 137 mEq/L (ref 135–145)
Sodium: 141 mEq/L (ref 135–145)

## 2011-12-09 LAB — URINALYSIS, ROUTINE W REFLEX MICROSCOPIC
Glucose, UA: NEGATIVE mg/dL
Ketones, ur: NEGATIVE mg/dL
Nitrite: NEGATIVE
Protein, ur: NEGATIVE mg/dL
pH: 5.5 (ref 5.0–8.0)

## 2011-12-09 LAB — CARDIAC PANEL(CRET KIN+CKTOT+MB+TROPI)
Relative Index: INVALID (ref 0.0–2.5)
Relative Index: INVALID (ref 0.0–2.5)
Total CK: 85 U/L (ref 7–177)
Troponin I: <0.3 ng/mL (ref <0.30)
Troponin I: <0.3 ng/mL (ref <0.30)

## 2011-12-09 LAB — BASIC METABOLIC PANEL WITH GFR
BUN: 83 mg/dL — ABNORMAL HIGH (ref 6–23)
CO2: 27 meq/L (ref 19–32)
Chloride: 101 meq/L (ref 96–112)
GFR calc Af Amer: 15 mL/min — ABNORMAL LOW (ref >90)
Glucose, Bld: 158 mg/dL — ABNORMAL HIGH (ref 70–99)
Potassium: 5.4 meq/L — ABNORMAL HIGH (ref 3.5–5.1)

## 2011-12-09 LAB — DIFFERENTIAL
Basophils Absolute: 0 10*3/uL (ref 0.0–0.1)
Basophils Relative: 0 % (ref 0–1)
Eosinophils Absolute: 0.1 10*3/uL (ref 0.0–0.7)
Eosinophils Relative: 1 % (ref 0–5)
Lymphocytes Relative: 26 % (ref 12–46)
Lymphs Abs: 2.4 K/uL (ref 0.7–4.0)
Monocytes Absolute: 1.1 10*3/uL — ABNORMAL HIGH (ref 0.1–1.0)
Monocytes Relative: 12 % (ref 3–12)
Neutro Abs: 5.6 K/uL (ref 1.7–7.7)
Neutrophils Relative %: 61 % (ref 43–77)

## 2011-12-09 LAB — CREATININE, URINE, RANDOM: Creatinine, Urine: 75.6 mg/dL

## 2011-12-09 LAB — GLUCOSE, CAPILLARY
Glucose-Capillary: 147 mg/dL — ABNORMAL HIGH (ref 70–99)
Glucose-Capillary: 196 mg/dL — ABNORMAL HIGH (ref 70–99)
Glucose-Capillary: 201 mg/dL — ABNORMAL HIGH (ref 70–99)

## 2011-12-09 LAB — SODIUM, URINE, RANDOM: Sodium, Ur: 71 mEq/L

## 2011-12-09 MED ORDER — SODIUM CHLORIDE 0.9 % IV SOLN
INTRAVENOUS | Status: DC
  Administered 2011-12-09: 150 mL/h via INTRAVENOUS
  Administered 2011-12-09 – 2011-12-10 (×2): via INTRAVENOUS

## 2011-12-09 MED ORDER — TRAMADOL HCL 50 MG PO TABS
50.0000 mg | ORAL_TABLET | Freq: Four times a day (QID) | ORAL | Status: DC | PRN
  Administered 2011-12-10 – 2011-12-11 (×3): 50 mg via ORAL
  Filled 2011-12-09 (×3): qty 1

## 2011-12-09 MED ORDER — SODIUM CHLORIDE 0.9 % IV BOLUS (SEPSIS)
500.0000 mL | Freq: Once | INTRAVENOUS | Status: AC
  Administered 2011-12-09: 500 mL via INTRAVENOUS

## 2011-12-09 MED ORDER — SODIUM CHLORIDE 0.9 % IV SOLN: Freq: Once | INTRAVENOUS | Status: DC

## 2011-12-09 MED ORDER — INSULIN PUMP
SUBCUTANEOUS | Status: DC
  Administered 2011-12-09: 0.9 via SUBCUTANEOUS
  Administered 2011-12-09: 13:00:00 via SUBCUTANEOUS
  Administered 2011-12-09: 1 via SUBCUTANEOUS
  Administered 2011-12-10: 13:00:00 via SUBCUTANEOUS
  Administered 2011-12-10 (×3): 0.9 via SUBCUTANEOUS
  Filled 2011-12-09: qty 1

## 2011-12-09 MED ORDER — SODIUM CHLORIDE 0.9 % IJ SOLN
3.0000 mL | Freq: Two times a day (BID) | INTRAMUSCULAR | Status: DC
  Administered 2011-12-09: 3 mL via INTRAVENOUS

## 2011-12-09 MED ORDER — GABAPENTIN 300 MG PO CAPS
600.0000 mg | ORAL_CAPSULE | Freq: Every day | ORAL | Status: DC
  Administered 2011-12-09 – 2011-12-17 (×8): 600 mg via ORAL
  Filled 2011-12-09 (×10): qty 2

## 2011-12-09 MED ORDER — PANTOPRAZOLE SODIUM 40 MG PO TBEC
40.0000 mg | DELAYED_RELEASE_TABLET | Freq: Every day | ORAL | Status: DC
  Administered 2011-12-09 – 2011-12-10 (×2): 40 mg via ORAL
  Filled 2011-12-09 (×2): qty 1

## 2011-12-09 MED ORDER — DOCUSATE SODIUM 100 MG PO CAPS
100.0000 mg | ORAL_CAPSULE | Freq: Two times a day (BID) | ORAL | Status: DC
  Administered 2011-12-09 – 2011-12-10 (×3): 100 mg via ORAL
  Filled 2011-12-09 (×3): qty 1

## 2011-12-09 MED ORDER — ONDANSETRON HCL 4 MG PO TABS: 4.0000 mg | ORAL_TABLET | Freq: Four times a day (QID) | ORAL | Status: DC | PRN

## 2011-12-09 MED ORDER — ACETAMINOPHEN 650 MG RE SUPP: 650.0000 mg | Freq: Four times a day (QID) | RECTAL | Status: DC | PRN

## 2011-12-09 MED ORDER — ALBUTEROL SULFATE (5 MG/ML) 0.5% IN NEBU: 2.5000 mg | INHALATION_SOLUTION | RESPIRATORY_TRACT | Status: DC | PRN

## 2011-12-09 MED ORDER — HYDROCODONE-ACETAMINOPHEN 5-325 MG PO TABS: 1.0000 | ORAL_TABLET | ORAL | Status: DC | PRN

## 2011-12-09 MED ORDER — SIMVASTATIN 10 MG PO TABS
10.0000 mg | ORAL_TABLET | Freq: Every day | ORAL | Status: DC
  Administered 2011-12-09: 10 mg via ORAL
  Filled 2011-12-09 (×2): qty 1

## 2011-12-09 MED ORDER — ACETAMINOPHEN 325 MG PO TABS
650.0000 mg | ORAL_TABLET | Freq: Four times a day (QID) | ORAL | Status: DC | PRN
  Administered 2011-12-10 (×2): 650 mg via ORAL
  Filled 2011-12-09 (×2): qty 2

## 2011-12-09 MED ORDER — INSULIN PUMP
SUBCUTANEOUS | Status: DC
  Administered 2011-12-09: 13:00:00 via SUBCUTANEOUS
  Filled 2011-12-09: qty 1

## 2011-12-09 MED ORDER — ALUM & MAG HYDROXIDE-SIMETH 200-200-20 MG/5ML PO SUSP: 30.0000 mL | Freq: Four times a day (QID) | ORAL | Status: DC | PRN

## 2011-12-09 MED ORDER — GUAIFENESIN-DM 100-10 MG/5ML PO SYRP: 5.0000 mL | ORAL_SOLUTION | ORAL | Status: DC | PRN

## 2011-12-09 MED ORDER — ONDANSETRON HCL 4 MG/2ML IJ SOLN
4.0000 mg | Freq: Four times a day (QID) | INTRAMUSCULAR | Status: DC | PRN
  Administered 2011-12-10: 4 mg via INTRAVENOUS
  Filled 2011-12-09: qty 2

## 2011-12-09 NOTE — ED Notes (Signed)
Pt fell early Saturday morning and had laceration on top of head and seen at [facility] and laceration was sutured. Pt reports increased problems with walking "legs giving way" on Saturday with 2 falls today and unsteady with use of walker.

## 2011-12-09 NOTE — ED Notes (Signed)
Gave ECG to Dr. Oletta Lamas after I performed. 3:33 am JG.

## 2011-12-09 NOTE — ED Notes (Signed)
Sutures intact to top of head with dried blood

## 2011-12-09 NOTE — ED Notes (Signed)
Admitting MD at bedside, pt awaiting inpt beds assignment.  

## 2011-12-09 NOTE — H&P (Signed)
PCP:   Sanda Linger, MD, MD    Chief Complaint:  Fall and head injury HPI: Emma Yu is a 78 y.o. female   has a past medical history of PAD (peripheral artery disease); Aortic stenosis; Diabetes mellitus; Hypertension; Hyperlipidemia; Venous insufficiency; CHF (congestive heart failure); Atrial fib/flutter, transient; Atrial fibrillation; and DJD (degenerative joint disease) of hip.   Presented with tremor resulting in repeated falls She fell yesterday (Saturday) AM out of the bed and hit her head resulting in laceration needing stiches. Was seen at [facility] which put in a few stiches and discharged to home. She started to have some diarhea and feeling week and was brought in to [facility] ER where she was found to be anemic and dehydrated with acute renal failure. Patient is stated that she lost a lot of blood from the laceration to the scalp. Patient reports No fever or chills no Chest pain or sob. Patient states last year  during her hospitalization for hip surgery she developed atrial fibrillation and required cardioversion.  Review of Systems:    Pertinent positives include:, diarrhea, abdominal pain,   Constitutional:  No weight loss, night sweats, Fevers, chills, fatigue, weight loss  HEENT:  No headaches, Difficulty swallowing,Tooth/dental problems,Sore throat,  No sneezing, itching, ear ache, nasal congestion, post nasal drip,  Cardio-vascular:  No chest pain, Orthopnea, PND, anasarca, dizziness, palpitations.no Bilateral lower extremity swelling  GI:  No heartburn, indigestion, nausea, vomiting change in bowel habits, loss of appetite, melena, blood in stool, hematemesis Resp:  no shortness of breath at rest. No dyspnea on exertion, No excess mucus, no productive cough, No non-productive cough, No coughing up of blood.No change in color of mucus.No wheezing. Skin:  no rash or lesions. No jaundice GU:  no dysuria, change in color of urine, no urgency or  frequency. No straining to urinate.  No flank pain.  Musculoskeletal:  No joint pain or no joint swelling. No decreased range of motion. No back pain.  Psych:  No change in mood or affect. No depression or anxiety. No memory loss.  Neuro: no localizing neurological complaints, no tingling, no weakness, no double vision, no gait abnormality, no slurred speech, no confusion  Otherwise ROS are negative except for above, 10 systems were reviewed  Past Medical History: Past Medical History  Diagnosis Date  . PAD (peripheral artery disease)     s/p bilateral comon iliac artery stenting in 2002. Known significant  R SFA  disease  . Aortic stenosis     Mild  . Diabetes mellitus   . Hypertension   . Hyperlipidemia   . Venous insufficiency   . CHF (congestive heart failure)     normal EF  . Atrial fib/flutter, transient   . Atrial fibrillation     post op  . DJD (degenerative joint disease) of hip     s/p R THR 10/2010   Past Surgical History  Procedure Date  . Total hip arthroplasty 1991, 1994    redo in 1994  . Lumbar disc surgery 1999  . Cardiac catheterization 01/10/2011    No significant CAD     Medications: Prior to Admission medications   Medication Sig Start Date End Date Taking? Authorizing Provider  acetaminophen (TYLENOL) 325 MG tablet Take 325 mg by mouth once as needed. As needed for pain.   Yes Historical Provider, MD  Ascorbic Acid (VITAMIN C) 500 MG tablet Take 500 mg by mouth daily.     Yes Historical Provider, MD  aspirin 81 MG tablet Take 2 at bedtime    Yes Historical Provider, MD  b complex vitamins tablet Take 1 tablet by mouth daily.     Yes Historical Provider, MD  Cholecalciferol (VITAMIN D3) 2000 UNITS capsule Take 2,000 Units by mouth daily.     Yes Historical Provider, MD  Cinnamon 500 MG capsule 4 capsules daily    Yes Historical Provider, MD  cloNIDine (CATAPRES) 0.2 MG tablet Take 0.2 mg by mouth 4 (four) times daily. 10/31/11  Yes Etta Grandchild, MD   Coenzyme Q10 10 MG capsule (300mg  caps) 1 capsule by mouth daily    Yes Historical Provider, MD  diclofenac sodium (VOLTAREN) 1 % GEL Apply 1 application topically 4 (four) times daily as needed. For pain 10/31/11  Yes Etta Grandchild, MD  fish oil-omega-3 fatty acids 1000 MG capsule Take 2 g by mouth daily.     Yes Historical Provider, MD  furosemide (LASIX) 40 MG tablet Take 40 mg by mouth 2 (two) times daily. 10/31/11  Yes Etta Grandchild, MD  gabapentin (NEURONTIN) 300 MG capsule Take 600 mg by mouth at bedtime. 10/31/11  Yes Etta Grandchild, MD  Insulin Human (INSULIN PUMP) 100 unit/ml SOLN Inject into the skin continuous. Humalog Insulin- total of 80 units/day   Yes Historical Provider, MD  methocarbamol (ROBAXIN) 500 MG tablet Take 500 mg by mouth 3 (three) times daily as needed. For muscle spasms 04/19/11  Yes Newt Lukes, MD  metoprolol tartrate (LOPRESSOR) 25 MG tablet Take 50 mg by mouth 2 (two) times daily. 10/31/11 10/30/12 Yes Etta Grandchild, MD  omeprazole (PRILOSEC) 40 MG capsule Take 40 mg by mouth daily. 10/31/11  Yes Etta Grandchild, MD  pravastatin (PRAVACHOL) 20 MG tablet Take 20 mg by mouth every evening. 10/31/11 10/30/12 Yes Etta Grandchild, MD  traMADol (ULTRAM) 50 MG tablet Take 50 mg by mouth every 6 (six) hours as needed. For pain 07/30/11  Yes Newt Lukes, MD  valsartan (DIOVAN) 320 MG tablet Take 320 mg by mouth at bedtime. 10/31/11 10/30/12 Yes Etta Grandchild, MD    Allergies:   Allergies  Allergen Reactions  . Carvedilol Other (See Comments)    Heart stops  . Diltiazem Hcl Other (See Comments)    Chest pain  . Fenofibrate   . Hctz (Hydrochlorothiazide) Other (See Comments)    Chest pain  . Nisoldipine Other (See Comments)    Chest pain  . Propranolol Diarrhea    Chest pain  . Tekturna (Aliskiren Fumarate) Other (See Comments)    Unknown reaction  . Valturna (Aliskiren-Valsartan) Other (See Comments)    Unknown reaction    Social  History:  Ambulatory with  Environmental consultant  Lives at home with son   reports that she has never smoked. She has never used smokeless tobacco. She reports that she does not drink alcohol or use illicit drugs.   Family History: family history includes Diabetes in her father and other.    Physical Exam: Patient Vitals for the past 24 hrs:  BP Temp Temp src Pulse Resp SpO2  12/09/11 0320 105/32 mmHg - - 62  17  96 %  12/09/11 0315 82/28 mmHg - - 59  34  92 %  12/09/11 0215 120/32 mmHg - - 64  - 98 %  12/09/11 0200 101/37 mmHg - - 51  - 95 %  12/09/11 0106 100/38 mmHg - - - 18  94 %  12/09/11 0105 88/54 mmHg - - -  18  93 %  12/09/11 0101 97/36 mmHg - - - 18  95 %  12/08/11 2312 131/93 mmHg 98.3 F (36.8 C) Oral 58  16  98 %    1. General:  in No Acute distress 2. Psychological: Alert and  Oriented 3. Head/ENT:    Dry Mucous Membranes                          Head traumatic laceration to the skull and hair matted with blood, neck supple                          Normal  Dentition 4. SKIN:  decreased Skin turgor,  Skin clean Dry and intact no rash, bumps site is intact 5. Heart: Regular rate and rhythm loud ejection murmur heard, Rub or gallop 6. Lungs: Occasional coarse sounds no wheezes or crackles   7. Abdomen: Soft, non-tender, Non distended 8. Lower extremities: no clubbing, cyanosis, or edema 9. Neurologically Grossly intact, moving all 4 extremities equally 10. MSK: Normal range of motion  body mass index is unknown because there is no height or weight on file.   Labs on Admission:   [facility] 12/08/11  NA 137  K 5.4*  CL 101  CO2 27  GLUCOSE 158*  BUN 83*  CREATININE 3.06*  CALCIUM 9.1  MG --  PHOS --   No results found for this basename: AST:2,ALT:2,ALKPHOS:2,BILITOT:2,PROT:2,ALBUMIN:2 in the last 72 hours No results found for this basename: LIPASE:2,AMYLASE:2 in the last 72 hours  Basename 12/08/11 2346  WBC 9.1  NEUTROABS 5.6  HGB 9.2*  HCT 28.5*  MCV 94.1   PLT 155   No results found for this basename: CKTOTAL:3,CKMB:3,CKMBINDEX:3,TROPONINI:3 in the last 72 hours No results found for this basename: TSH,T4TOTAL,FREET3,T3FREE,THYROIDAB in the last 72 hours No results found for this basename: VITAMINB12:2,FOLATE:2,FERRITIN:2,TIBC:2,IRON:2,RETICCTPCT:2 in the last 72 hours Lab Results  Component Value Date   HGBA1C 6.0 09/10/2011    The CrCl is unknown because both a height and weight (above a minimum accepted value) are required for this calculation. ABG    Component Value Date/Time   PHART 7.457* 11/10/2010 0545   HCO3 28.3* 11/10/2010 0545   TCO2 29.5 11/10/2010 0545   ACIDBASEDEF 1.0 11/05/2010 1917   O2SAT 86.2 11/10/2010 0545     No results found for this basename: DDIMER     Other results:  I have pearsonaly reviewed this: ECG REPORT  Rate: 59  Rhythm: Sinus bradycardia ST&T Change: Nonspecific changes   Cultures:    Component Value Date/Time   SDES BLOOD CENTRAL LINE 11/04/2010 1816   SPECREQUEST BOTTLES DRAWN AEROBIC AND ANAEROBIC 10CC BLUE 2CC RED 11/04/2010 1816   CULT NO GROWTH 5 DAYS 11/04/2010 1816   REPTSTATUS 11/11/2010 FINAL 11/04/2010 1816       Radiological Exams on Admission: No results found.  Assessment/Plan  This is a 45 female history.1 diabetes on insulin pump) [place] with recurrent falls who came in with a large loose solution to her head with some blood loss, dehydration, hypotension and anemia.  Present on Admission:  .Hypotension - will hold her clonidine and metoprolol give gentle IV fluid monitor and step down she is currently afebrile, blood pressure responds to fluids, patient is alert and oriented  .Atrial fib/flutter, transient - currently in sinus am not a good candidate for Coumadin secondary to falls  .Diabetes mellitus type 1 with peripheral artery disease -  continue insulin pump. We'll need to provide patient with replacement insulin as needed  .Tremor - this is chronic  .Diarrhea - patient  had about 6 stools since yesterday will obtain  stool studies. Patient did not endorse any recent antibiotic use  .Acute renal failure - likely secondary to dehydration, check FeNA and if not improved with IVF would obtain renal US and consider renal consult.  Will hold Diovan  .Hyperkalemia - repeat labs and continues to stay elevated will treat  .Dehydration - we'll administer IV fluids will be careful given history of heart failure Remote history of CHF last echocardiogram done in 2011 given loud murmur will repeat to evaluate degree of aortic stenosis, the patient is dehydrated we will hold the Lasix Anemia- due to blood loss, no indication for transfusion at this point Prophylaxis: SCD, Protonix  CODE STATUS: Patient wishes to BE full code  I have spent a total of 65 min on this admission  Rafeef Lau 12/09/2011, 4:37 AM

## 2011-12-09 NOTE — ED Provider Notes (Addendum)
History     CSN: [id number]  Arrival date & time 12/08/11  2245   First MD Initiated Contact with Patient 12/08/11 2319      Chief Complaint  Patient presents with  . Fall    (Consider location/radiation/quality/duration/timing/severity/associated sxs/prior treatment) HPI Comments: Patient reportedly lives at home with her son, however he does work and has to leave her for several hours during the day. Patient has had worsening tremors and is currently being followed by a neurologist with her next appointment with Dr. Modesto Charon being on May 2. In the last one or 2 weeks, she has had worsening in tremors and difficulty ambulating and has experienced several falls. She fell at 2:00 in the morning yesterday and had a pretty significant scalp laceration that was treated at the local emergency department. She currently did have a head CT scan and reported that it was okay and she had a wound repair. Patient reports that for some reason she has had 6 loose bowel movements today without any nausea or vomiting. Since then she has also fallen 2 or 3 more times and due to the multiple falls, patient's family has brought her here for reevaluation. The patient denies any new neck pain and denies any significant headache. Again she denies any nausea or vomiting. She reports no chest pain, abdominal pain or back pain. She denies pain in her hips, pelvis or her other limbs. She does feel a little bit dehydrated by her own subjective report. She denies feeling lightheaded. Son reports there was a significant amount in his opinion of blood from scalp laceration when she fell yesterday.  Patient is a 78 y.o. female presenting with fall. The history is provided by the patient and a relative.  Fall Pertinent negatives include no fever, no abdominal pain, no nausea, no vomiting and no headaches.    Past Medical History  Diagnosis Date  . PAD (peripheral artery disease)     s/p bilateral comon iliac artery stenting in  2002. Known significant  R SFA  disease  . Aortic stenosis     Mild  . Diabetes mellitus   . Hypertension   . Hyperlipidemia   . Venous insufficiency   . CHF (congestive heart failure)     normal EF  . Atrial fib/flutter, transient   . Atrial fibrillation     post op  . DJD (degenerative joint disease) of hip     s/p R THR 10/2010    Past Surgical History  Procedure Date  . Total hip arthroplasty 1991, 1994    redo in 1994  . Lumbar disc surgery 1999  . Cardiac catheterization 01/10/2011    No significant CAD    Family History  Problem Relation Age of Onset  . Diabetes Father   . Diabetes Other     5/8 sibs    History  Substance Use Topics  . Smoking status: Never Smoker   . Smokeless tobacco: Never Used   Comment: Retired-widowed 89, lives with son  . Alcohol Use: No    OB History    Grav Para Term Preterm Abortions TAB SAB Ect Mult Living                  Review of Systems  Constitutional: Negative for fever and chills.  HENT: Negative for neck pain and neck stiffness.   Gastrointestinal: Positive for diarrhea. Negative for nausea, vomiting and abdominal pain.  Musculoskeletal: Negative for back pain.  Neurological: Positive for tremors. Negative for  syncope, speech difficulty, weakness, light-headedness and headaches.  All other systems reviewed and are negative.    Allergies  Carvedilol; Diltiazem hcl; Fenofibrate; Hctz; Nisoldipine; Propranolol; Derl Barrow  Home Medications   Current Outpatient Rx  Name Route Sig Dispense Refill  . ACETAMINOPHEN 325 MG PO TABS Oral Take 325 mg by mouth once as needed. As needed for pain.    Marland Kitchen VITAMIN C 500 MG PO TABS Oral Take 500 mg by mouth daily.      . ASPIRIN 81 MG PO TABS  Take 2 at bedtime     . B COMPLEX PO TABS Oral Take 1 tablet by mouth daily.      Marland Kitchen VITAMIN D3 2000 UNITS PO CAPS Oral Take 2,000 Units by mouth daily.      Marland Kitchen CINNAMON 500 MG PO CAPS  4 capsules daily     . CLONIDINE HCL  0.2 MG PO TABS Oral Take 0.2 mg by mouth 4 (four) times daily.    Marland Kitchen COENZYME Q10 10 MG PO CAPS  (300mg  caps) 1 capsule by mouth daily     . DICLOFENAC SODIUM 1 % TD GEL Topical Apply 1 application topically 4 (four) times daily as needed. For pain    . OMEGA-3 FATTY ACIDS 1000 MG PO CAPS Oral Take 2 g by mouth daily.      . FUROSEMIDE 40 MG PO TABS Oral Take 40 mg by mouth 2 (two) times daily.    Marland Kitchen GABAPENTIN 300 MG PO CAPS Oral Take 600 mg by mouth at bedtime.    . INSULIN PUMP Subcutaneous Inject into the skin continuous. Humalog Insulin- total of 80 units/day    . METHOCARBAMOL 500 MG PO TABS Oral Take 500 mg by mouth 3 (three) times daily as needed. For muscle spasms    . METOPROLOL TARTRATE 25 MG PO TABS Oral Take 50 mg by mouth 2 (two) times daily.    Marland Kitchen OMEPRAZOLE 40 MG PO CPDR Oral Take 40 mg by mouth daily.    Marland Kitchen PRAVASTATIN SODIUM 20 MG PO TABS Oral Take 20 mg by mouth every evening.    Marland Kitchen TRAMADOL HCL 50 MG PO TABS Oral Take 50 mg by mouth every 6 (six) hours as needed. For pain    . VALSARTAN 320 MG PO TABS Oral Take 320 mg by mouth at bedtime.      BP 105/32  Pulse 62  Temp(Src) 98.3 F (36.8 C) (Oral)  Resp 17  SpO2 96%  Physical Exam  Nursing note and vitals reviewed. Constitutional: She appears well-developed and well-nourished.  HENT:  Head: Normocephalic.  Eyes: Pupils are equal, round, and reactive to light. No scleral icterus.  Neck: Normal range of motion. Neck supple. No spinous process tenderness present. Normal range of motion present.  Cardiovascular: Normal rate and regular rhythm.   No murmur heard. Pulmonary/Chest: Effort normal. No respiratory distress. She has no wheezes.  Abdominal: Soft. She exhibits no distension and no mass. There is no tenderness. There is no rebound and no guarding.  Musculoskeletal: She exhibits no edema and no tenderness.       Cervical back: She exhibits normal range of motion, no bony tenderness and no deformity.       Thoracic  back: She exhibits no tenderness and no bony tenderness.       Lumbar back: She exhibits no tenderness, no bony tenderness and no deformity.  Neurological: [name] alert.       Tremors, both at rest  and with intention of both arms.  Tremors less in legs, but worse with intention.  Skin: Skin is warm and dry.    ED Course  Procedures (including critical care time)  Labs Reviewed  CBC - Abnormal; Notable for the following:    RBC 3.03 (*)    Hemoglobin 9.2 (*)    HCT 28.5 (*)    All other components within normal limits  DIFFERENTIAL - Abnormal; Notable for the following:    Monocytes Absolute 1.1 (*)    All other components within normal limits  BASIC METABOLIC PANEL - Abnormal; Notable for the following:    Potassium 5.4 (*)    Glucose, Bld 158 (*)    BUN 83 (*)    Creatinine, Ser 3.06 (*)    GFR calc non Af Amer 13 (*)    GFR calc Af Amer 15 (*)    All other components within normal limits   No results found.   1. Dehydration   2. Renal failure   3. Orthostasis   4. Head injury   5. Anemia     3:19 AM Labs finally resulted and pt is in renal failure, likely due to dehdyration.  Will admit to hospital due to Cr of 3.  Pt was initially going to place din CDU under dehydration protocol until Cr and BUN resulted.    MDM  Pt is at baseline mentation.  Will check orthostatics.  More freq falls may be from dehydration given the loose BM's today.  No N/V, abd is soft, no fever.  Pt denies syncope or CP's.  She reports just her legs will give out on her.  Some safety issues, but seems to be a safe environment.  Will give IVF's, check basic labs.     EKG at time 0323 shows NSR at rate 59, prolonged PR at 224 ms, LAD, no ST or T wave abn's.      Gavin Pound. Oletta Lamas, MD 12/09/11 7829  Gavin Pound. Oletta Lamas, MD 12/09/11 5621  Gavin Pound. Oletta Lamas, MD 12/09/11 [phone]

## 2011-12-09 NOTE — Progress Notes (Signed)
Pt arrived from ER.  

## 2011-12-09 NOTE — ED Notes (Signed)
Pt with insulin pump to abd. - Pt states insulin pump at 9 uinits per hour

## 2011-12-09 NOTE — ED Notes (Signed)
Pt transferred to CDU 7

## 2011-12-09 NOTE — Progress Notes (Signed)
[facility] [place] TEAM 1 - Stepdown/ICU TEAM  PCP:  Sanda Linger, MD  Subjective: 78 y.o. female w/ medical history of PAD; Aortic stenosis; Diabetes mellitus; Hypertension; Hyperlipidemia; Venous insufficiency; CHF; Atrial fib/flutter; and DJD of hip who resented with a tremor resulting in repeated falls.  She fell Saturday AM out of the bed and hit her head resulting in laceration needing stiches. Was seen at [facility] where they put in a few stiches and discharged her home. She started to have some diarhea and was feeling week and was brought in to [facility] ER where she was found to be anemic and dehydrated with acute renal failure.  [name] seen for a f/u visit.  I have had a lengthy discussion with her son at bedside.  Objective: Weight change:   Intake/Output Summary (Last 24 hours) at 12/09/11 1612 Last data filed at 12/09/11 1500  Gross per 24 hour  Intake   2060 ml  Output    350 ml  Net   1710 ml   Blood pressure 136/36, pulse 82, temperature 98.3 F (36.8 C), temperature source Oral, resp. rate 18, height 5\' 4"  (1.626 m), weight 66.6 kg (146 lb 13.2 oz), SpO2 100.00%.  CBG (last 3)   Basename 12/09/11 1224 12/09/11 0632  GLUCAP 196* 147*   Physical Exam: F/U exam completed  Lab Results:  Basename 12/09/11 0730 12/08/11  NA 141 137  K 5.1 5.4*  CL 104 101  CO2 29 27  GLUCOSE 151* 158*  BUN 75* 83*  CREATININE 2.46* 3.06*  CALCIUM 8.6 9.1  MG -- --  PHOS -- --    Basename 12/09/11 0730 12/08/11 2346  WBC 7.0 9.1  NEUTROABS -- 5.6  HGB 8.6* 9.2*  HCT 26.3* 28.5*  MCV 95.3 94.1  PLT 132* 155    Basename 12/09/11 1423 12/09/11 0903  CKTOTAL 89 93  CKMB 2.1 2.2  CKMBINDEX -- --  TROPONINI <0.30 <0.30   Micro Results: Recent Results (from the past 240 hour(s))  MRSA PCR SCREENING     Status: Normal   Collection Time   12/09/11  8:42 AM      Component Value Range Status Comment   MRSA by PCR NEGATIVE  NEGATIVE  Final      Studies/Results: All recent x-ray/radiology reports have been reviewed in detail.   Medications: I have reviewed the patient's complete medication list.  Assessment/Plan:  Severe dehydration + 1700 since admit  hypotension Due to BP meds + volume depletion - improving w/ volume expansion  Acute renal failure Improving w/ volume expansion - FeNa 2.05%  Hyperkalemia Improving w/ volume expansion/recovery of renal fxn  +UA/UTI Follow urine cx - with no leukocytosis nor fever, will hold abx for now  Normocytic anemia Hgb falling with hydration - will need to follow trend  PAD s/p bilateral comon iliac artery stenting in 2002 - known significant R SFA disease   DM Uses insulin pump at home - CBG poorly controlled at present - adjust tx - resume insulin pump once tolerating full diet  HTN  Afib/flutter Not a coumadin candidate due to frequent falls  Lonia Blood, MD [facility] Office  [phone] Pager [phone]  On-Call/Text Page:      [email or web address]      password [facility]

## 2011-12-09 NOTE — ED Notes (Signed)
Pt rate increased on iv fluids per Dr. Oletta Lamas due to hypotensive. Pt resting with eyes closed, no s/s of any pain or distress noted. Will continue to monitor pt.

## 2011-12-09 NOTE — ED Notes (Signed)
Spoke to lab tech about patient's blood work.

## 2011-12-09 NOTE — ED Notes (Signed)
Pt will be admitted per MD, MD notified pt is still hypotensive at this time. Will continue to monitor pt.

## 2011-12-09 NOTE — ED Notes (Signed)
Pt is A/O, answers questions appropriately. Pt denies any pain or complaints, no neuro deficits noted. Pt placed on cardiac monitor and remains hypotensive, pt receiving IV fluids and will continue to monitor pt.

## 2011-12-09 NOTE — ED Notes (Signed)
Pt's CBG was 147 6:33am JG.

## 2011-12-09 NOTE — ED Notes (Signed)
Pt resting quietly with eyes closed, no s/s of any pain or distress observed. Pt is easily arousable and will continue to monitor pt.

## 2011-12-10 ENCOUNTER — Inpatient Hospital Stay (HOSPITAL_COMMUNITY): Payer: Medicare Other

## 2011-12-10 DIAGNOSIS — I359 Nonrheumatic aortic valve disorder, unspecified: Secondary | ICD-10-CM

## 2011-12-10 LAB — BASIC METABOLIC PANEL
BUN: 22 mg/dL (ref 6–23)
Calcium: 9.3 mg/dL (ref 8.4–10.5)
GFR calc non Af Amer: 55 mL/min — ABNORMAL LOW (ref >90)
Glucose, Bld: 252 mg/dL — ABNORMAL HIGH (ref 70–99)

## 2011-12-10 LAB — CBC
HCT: 32.8 % — ABNORMAL LOW (ref 36.0–46.0)
Hemoglobin: 8.4 g/dL — ABNORMAL LOW (ref 12.0–15.0)
Hemoglobin: 10.3 g/dL — ABNORMAL LOW (ref 12.0–15.0)
MCH: 30.2 pg (ref 26.0–34.0)
MCH: 30 pg (ref 26.0–34.0)
MCHC: 31.4 g/dL (ref 30.0–36.0)
MCV: 95.7 fL (ref 78.0–100.0)
MCV: 95.6 fL (ref 78.0–100.0)
RBC: 2.78 MIL/uL — ABNORMAL LOW (ref 3.87–5.11)

## 2011-12-10 LAB — GLUCOSE, CAPILLARY
Glucose-Capillary: 142 mg/dL — ABNORMAL HIGH (ref 70–99)
Glucose-Capillary: 128 mg/dL — ABNORMAL HIGH (ref 70–99)
Glucose-Capillary: 178 mg/dL — ABNORMAL HIGH (ref 70–99)

## 2011-12-10 LAB — COMPREHENSIVE METABOLIC PANEL
AST: 14 U/L (ref 0–37)
Albumin: 2.6 g/dL — ABNORMAL LOW (ref 3.5–5.2)
Calcium: 8.7 mg/dL (ref 8.4–10.5)
Chloride: 109 mEq/L (ref 96–112)
Creatinine, Ser: 1.18 mg/dL — ABNORMAL HIGH (ref 0.50–1.10)
Sodium: 143 mEq/L (ref 135–145)
Total Bilirubin: 0.3 mg/dL (ref 0.3–1.2)

## 2011-12-10 LAB — TSH: TSH: 0.511 u[IU]/mL (ref 0.350–4.500)

## 2011-12-10 LAB — POCT I-STAT 3, ART BLOOD GAS (G3+)
Acid-base deficit: 2 mmol/L (ref 0.0–2.0)
O2 Saturation: 96 %
TCO2: 24 mmol/L (ref 0–100)
pO2, Arterial: 82 mmHg (ref 80.0–100.0)

## 2011-12-10 LAB — DIFFERENTIAL
Basophils Relative: 0 % (ref 0–1)
Eosinophils Absolute: 0 10*3/uL (ref 0.0–0.7)
Lymphs Abs: 0.5 10*3/uL — ABNORMAL LOW (ref 0.7–4.0)
Monocytes Absolute: 0.9 10*3/uL (ref 0.1–1.0)
Neutro Abs: 14.3 10*3/uL — ABNORMAL HIGH (ref 1.7–7.7)

## 2011-12-10 LAB — PHOSPHORUS: Phosphorus: 2.4 mg/dL (ref 2.3–4.6)

## 2011-12-10 MED ORDER — DEXTROSE 5 % IV SOLN
1.0000 g | INTRAVENOUS | Status: DC
  Administered 2011-12-10 – 2011-12-12 (×3): 1 g via INTRAVENOUS
  Filled 2011-12-10 (×4): qty 10

## 2011-12-10 MED ORDER — HYDRALAZINE HCL 20 MG/ML IJ SOLN
10.0000 mg | INTRAMUSCULAR | Status: DC | PRN
  Administered 2011-12-11: 10 mg via INTRAVENOUS

## 2011-12-10 MED ORDER — DOCUSATE SODIUM 100 MG PO CAPS: 100.0000 mg | ORAL_CAPSULE | Freq: Every day | ORAL | Status: DC

## 2011-12-10 MED ORDER — METOPROLOL TARTRATE 1 MG/ML IV SOLN
10.0000 mg | Freq: Four times a day (QID) | INTRAVENOUS | Status: DC
  Administered 2011-12-10 – 2011-12-12 (×7): 10 mg via INTRAVENOUS
  Filled 2011-12-10 (×11): qty 10

## 2011-12-10 MED ORDER — PANTOPRAZOLE SODIUM 40 MG IV SOLR
40.0000 mg | INTRAVENOUS | Status: DC
  Administered 2011-12-10: 40 mg via INTRAVENOUS
  Filled 2011-12-10 (×2): qty 40

## 2011-12-10 MED ORDER — HYDRALAZINE HCL 20 MG/ML IJ SOLN
10.0000 mg | INTRAMUSCULAR | Status: DC | PRN
  Administered 2011-12-10: 20 mg via INTRAVENOUS
  Administered 2011-12-10: 10 mg via INTRAVENOUS
  Filled 2011-12-10: qty 0.5

## 2011-12-10 MED ORDER — OXYCODONE HCL 5 MG PO TABS: 5.0000 mg | ORAL_TABLET | ORAL | Status: DC | PRN

## 2011-12-10 MED ORDER — CLONIDINE HCL 0.2 MG PO TABS
0.2000 mg | ORAL_TABLET | Freq: Three times a day (TID) | ORAL | Status: DC
  Filled 2011-12-10 (×2): qty 1

## 2011-12-10 MED ORDER — CLONIDINE HCL 0.3 MG/24HR TD PTWK
0.3000 mg | MEDICATED_PATCH | TRANSDERMAL | Status: DC
  Administered 2011-12-10: 0.3 mg via TRANSDERMAL
  Filled 2011-12-10: qty 1

## 2011-12-10 MED ORDER — INSULIN PUMP
Freq: Three times a day (TID) | SUBCUTANEOUS | Status: DC
  Administered 2011-12-11 (×2): 16 via SUBCUTANEOUS
  Filled 2011-12-10: qty 1

## 2011-12-10 MED ORDER — WHITE PETROLATUM GEL
Status: AC
  Filled 2011-12-10: qty 5

## 2011-12-10 MED ORDER — SODIUM CHLORIDE 0.45 % IV SOLN
INTRAVENOUS | Status: DC
  Administered 2011-12-10: 18:00:00 via INTRAVENOUS

## 2011-12-10 MED ORDER — FUROSEMIDE 10 MG/ML IJ SOLN
20.0000 mg | Freq: Once | INTRAMUSCULAR | Status: AC
  Administered 2011-12-10: 20 mg via INTRAVENOUS
  Filled 2011-12-10: qty 2

## 2011-12-10 MED ORDER — FLEET ENEMA 7-19 GM/118ML RE ENEM
1.0000 | ENEMA | Freq: Once | RECTAL | Status: DC
  Filled 2011-12-10: qty 1

## 2011-12-10 MED ORDER — METOPROLOL TARTRATE 50 MG PO TABS
50.0000 mg | ORAL_TABLET | Freq: Two times a day (BID) | ORAL | Status: DC
  Administered 2011-12-10: 50 mg via ORAL
  Filled 2011-12-10 (×2): qty 1

## 2011-12-10 MED ORDER — HYDRALAZINE HCL 20 MG/ML IJ SOLN
10.0000 mg | Freq: Four times a day (QID) | INTRAMUSCULAR | Status: DC
  Administered 2011-12-10 – 2011-12-13 (×11): 10 mg via INTRAVENOUS
  Filled 2011-12-10 (×8): qty 0.5
  Filled 2011-12-10: qty 1
  Filled 2011-12-10 (×6): qty 0.5
  Filled 2011-12-10: qty 1
  Filled 2011-12-10: qty 0.5

## 2011-12-10 NOTE — Progress Notes (Signed)
Notified pt.'s son Alinda Money that pt. Will be moving to room 2918. Pt.s son requests to be called if pt.'s condition worsens.

## 2011-12-10 NOTE — Progress Notes (Signed)
Pt.'s BP after hydralazine 182/58 manual. Pt.'s son also indicated to this RN that pt.'s tremors are worse. Dr. Sharon Seller paged. New orders received.

## 2011-12-10 NOTE — Progress Notes (Signed)
Inpatient Diabetes Program Recommendations  AACE/ADA: New Consensus Statement on Inpatient Glycemic Control (2009)  Target Ranges:  Prepandial:   less than 140 mg/dL      Peak postprandial:   less than 180 mg/dL (1-2 hours)      Critically ill patients:  140 - 180 mg/dL   Reason for Visit: Patient uses One Touch ping insulin pump.  Her basal rate is .9 units/hour and she boluses 16 units of Novolog with each meal.  She see's Dr. Everardo All for diabetes. Patient has signed contract and is appropriate to use insulin pump at this time.  Will follow.

## 2011-12-10 NOTE — Progress Notes (Signed)
*  PRELIMINARY RESULTS* Echocardiogram 2D Echocardiogram has been performed.  Jeryl Columbia R 12/10/2011, 10:05 AM

## 2011-12-10 NOTE — Evaluation (Signed)
Physical Therapy Evaluation Patient Details Name: Emma Yu MRN: [id number] DOB: 1927-08-08 Today's Date: 12/10/2011 Time: 1410-1440 PT Time Calculation (min): 30 min  PT Assessment / Plan / Recommendation Clinical Impression  pt presents s/p fall at home with hypotension, UTI, and Dehydration.  pt with Resting Tremors and continues to tremor during activity.  pt notes she has an MD appointment on [facility] for this.  pt notes she is alone during the day when son is at work.  Feel pt would benefit from SNF at D/C for 24hr care and continued rehab.      PT Assessment  Patient needs continued PT services    Follow Up Recommendations  Skilled nursing facility    Equipment Recommendations  Defer to next venue    Frequency Min 3X/week    Precautions / Restrictions Precautions Precautions: Fall Restrictions Weight Bearing Restrictions: No   Pertinent Vitals/Pain BP supine 176/55       Sitting 150/71       Post-SPT 165/107       Sitting 187/43      Mobility  Bed Mobility Bed Mobility: Supine to Sit;Sitting - Scoot to Edge of Bed Supine to Sit: 4: Min assist Sitting - Scoot to [place] of Bed: 5: Supervision Details for Bed Mobility Assistance: cues for encouragement and sequencing Transfers Transfers: Sit to Stand;Stand to Sit;Stand Pivot Transfers Sit to Stand: 4: Min assist;From elevated surface;From bed Stand to Sit: 4: Min assist;With upper extremity assist;With armrests;To chair/3-in-1 Stand Pivot Transfers: 4: Min assist;With armrests Details for Transfer Assistance: pt with tremors throughout mobility, but able to bear own wt and required A for balance and safety.  pt's HR increased to 120's during pivot.  RN aware.   Ambulation/Gait Ambulation/Gait Assistance: Not tested (comment) Stairs: No Wheelchair Mobility Wheelchair Mobility: No    Exercises     PT Goals Acute Rehab PT Goals PT Goal Formulation: With patient Time For Goal Achievement:  12/24/11 Potential to Achieve Goals: Good Pt will go Supine/Side to Sit: with modified independence PT Goal: Supine/Side to Sit - Progress: Goal set today Pt will go Sit to Supine/Side: with modified independence PT Goal: Sit to Supine/Side - Progress: Goal set today Pt will go Sit to Stand: with supervision;with upper extremity assist PT Goal: Sit to Stand - Progress: Goal set today Pt will go Stand to Sit: with supervision;with upper extremity assist PT Goal: Stand to Sit - Progress: Goal set today Pt will Ambulate: >150 feet;with supervision;with rolling walker PT Goal: Ambulate - Progress: Goal set today Pt will Go Up / Down Stairs: 1-2 stairs;with min assist;with least restrictive assistive device PT Goal: Up/Down Stairs - Progress: Goal set today  Visit Information  Last PT Received On: 12/10/11 Assistance Needed: +1    Subjective Data  Subjective: I have an appointment May 2nd about these tremors.   Patient Stated Goal: Get my strength back.     Prior Functioning  Home Living Lives With: Son Available Help at Discharge: Family;Available PRN/intermittently (Son works 5am-1:30pm) Type of Home: House Home Access: Stairs to enter Secretary/administrator of Steps: 1, 1 Entrance Stairs-Rails: None Home Layout: One level Home Adaptive Equipment: Walker - rolling Prior Function Level of Independence: Needs assistance Needs Assistance: Meal Prep;Light Housekeeping Able to Take Stairs?: Yes (With A) Driving: No Vocation: Retired Comments: pt notes son does all cooking and cleaning and she only grabs cold meals or microwave meals while he's at work.   Communication Communication: No difficulties  Cognition  Overall Cognitive Status: Appears within functional limits for tasks assessed/performed Arousal/Alertness: Awake/alert Orientation Level: Oriented X4 / Intact Behavior During Session: [facility] for tasks performed    Extremity/Trunk Assessment Right Lower Extremity  Assessment RLE ROM/Strength/Tone: Deficits RLE ROM/Strength/Tone Deficits: Grossly 4/5, Resting tremors RLE Coordination: Deficits RLE Coordination Deficits: Coordination limited by tremors Left Lower Extremity Assessment LLE ROM/Strength/Tone: Deficits LLE ROM/Strength/Tone Deficits: Groslly 4/5, Resting Tremors LLE Coordination: Deficits LLE Coordination Deficits: Limited by Tremors   Balance Balance Balance Assessed: Yes Static Sitting Balance Static Sitting - Balance Support: Bilateral upper extremity supported;Feet supported Static Sitting - Level of Assistance: 5: Stand by assistance Static Sitting - Comment/# of Minutes: pt's tremors increase in strength at times making pt have to work to maintain balance.    End of Session PT - End of Session Equipment Utilized During Treatment: Gait belt Activity Tolerance: Patient tolerated treatment well Patient left: in chair;with call bell/phone within reach Nurse Communication: Mobility status (BPs and HR)   Virda Betters, Alison Murray, [place] [phone] 12/10/2011, 2:51 PM

## 2011-12-10 NOTE — Progress Notes (Signed)
Report given, pt and family aware of transfer.  Pt transferred to 3002 via wheelchair.  NAD noted.

## 2011-12-10 NOTE — Progress Notes (Signed)
Junious Silk, PA informed of BP.  No new orders.  Pt denies any c/o pain, HA, or blurred vision.

## 2011-12-10 NOTE — Progress Notes (Signed)
PT/ OT Cancellation Note  Treatment cancelled today due to medical issues with patient which prohibited therapy. Per. RN pt. With increased BP and will hold currently and re-attempt in PM as time allows.   Marijah Larranaga, OTR/L Pager [phone] 12/10/2011, 8:16 AM

## 2011-12-10 NOTE — Progress Notes (Signed)
RN called NP 2/2 pt looking different than earlier in shift. RN stated pt was more lethargic and seemed to be working harder to breathe. BP up as well. Ventimask on.  S: Pt says she feels fine except for some abd pain. Pt denies chest pain, SOB, or HA. RN says pt vomited a little while ago. [name] being kept NPO for now.  O: Vitals reviewed. [name] satting well on venti mask. HR slightly tachy, but regular. Abd soft, BS are hypoactive, no pain with palpation.  A/P: 1. Lethargy-? Cause. She did fall and hit her head before admission. Didn't have CT head done here, ? Done at [facility]. I will go ahead and get one since her status has changed and she did lacerate her head when she fell. ABG and stat labs.  2. N/v-NPO for now. I reviewed her meds. Most meds are IV.  3. HTN-up some. Cont IV prns. As she wakes up more, can add home meds.  4. CXR reviewed with ? Worsening infiltrates and/or pulmonary edema. Slow IVF to [facility] (last creat down to 1.18) for now. Lasix 20mg  IV x i dose. CXR in a.m. Is on Abx. Maren Reamer, NP [facility]

## 2011-12-10 NOTE — Progress Notes (Signed)
Pt. With decreased sats and continuing to vomit. Sats 78%, increased O2 to 4L. Now 92%. Dr. Sharon Seller notified, order received to transfer back to SDU. Will monitor.

## 2011-12-10 NOTE — Progress Notes (Addendum)
[facility] [place] TEAM 1 - Stepdown/ICU TEAM  PCP:  Sanda Linger, MD  Subjective: 78 y.o. female w/ medical history of PAD; Aortic stenosis; Diabetes mellitus; Hypertension; Hyperlipidemia; Venous insufficiency; CHF; Atrial fib/flutter; and DJD of hip who resented with a tremor resulting in repeated falls.  She fell Saturday AM out of the bed and hit her head resulting in laceration needing stiches. Was seen at [facility] where they put in a few stiches and discharged her home. She started to have some diarhea and was feeling week and was brought in to [facility] ER where she was found to be anemic and dehydrated with acute renal failure.  The pt is awake and alert.  She c/o some pain and stiffness in her neck.  She denies f/c, sob, n/v, SSCP, or abdom pain.  She has not had any more diarrhea.   Objective: Weight change:   Intake/Output Summary (Last 24 hours) at 12/10/11 1340 Last data filed at 12/10/11 1138  Gross per 24 hour  Intake   1420 ml  Output   3700 ml  Net  -2280 ml   Blood pressure 170/40, pulse 87, temperature 98.1 F (36.7 C), temperature source Oral, resp. rate 18, height 5\' 4"  (1.626 m), weight 66.6 kg (146 lb 13.2 oz), SpO2 95.00%.  CBG (last 3)   Basename 12/10/11 1149 12/10/11 0740 12/10/11 0323  GLUCAP 128* 128* 108*   Physical Exam: General: No acute respiratory distress Lungs: Clear to auscultation bilaterally without wheezes or crackles Cardiovascular: Regular rate and rhythm with 2/6 holosystolic M w/o gallup or rub Abdomen: Nontender, nondistended, soft, bowel sounds positive, no rebound, no ascites, no appreciable mass Extremities: No significant cyanosis, clubbing, or edema bilateral lower extremities  Lab Results:  Basename 12/10/11 0405 12/09/11 0730 12/08/11  NA 143 141 137  K 4.4 5.1 5.4*  CL 109 104 101  CO2 27 29 27   GLUCOSE 105* 151* 158*  BUN 38* 75* 83*  CREATININE 1.18* 2.46* 3.06*  CALCIUM 8.7 8.6 9.1  MG 2.0 --  --  PHOS 2.4 -- --    Basename 12/10/11 0405 12/09/11 0730 12/08/11 2346  WBC 5.8 7.0 9.1  NEUTROABS -- -- 5.6  HGB 8.4* 8.6* 9.2*  HCT 26.6* 26.3* 28.5*  MCV 95.7 95.3 94.1  PLT 140* 132* 155    Basename 12/09/11 2025 12/09/11 1423 12/09/11 0903  CKTOTAL 85 89 93  CKMB 1.6 2.1 2.2  CKMBINDEX -- -- --  TROPONINI <0.30 <0.30 <0.30   Micro Results: Recent Results (from the past 240 hour(s))  URINE CULTURE     Status: Normal (Preliminary result)   Collection Time   12/09/11  5:56 AM      Component Value Range Status Comment   Specimen Description URINE, CLEAN CATCH   Final    Special Requests NONE   Final    Culture  Setup Time [id number]   Final    Colony Count >=100,000 COLONIES/ML   Final    Culture ESCHERICHIA COLI   Final    Report Status PENDING   Incomplete   MRSA PCR SCREENING     Status: Normal   Collection Time   12/09/11  8:42 AM      Component Value Range Status Comment   MRSA by PCR NEGATIVE  NEGATIVE  Final     Studies/Results: All recent x-ray/radiology reports have been reviewed in detail.   Medications: I have reviewed the patient's complete medication list.  Assessment/Plan:  Severe dehydration Clinically  resolved - will cont IVF until renal function fully normalized  Neck pain s/p fall Was seen post-fall in ER at [facility] - will obtain records to determine if pt has had x-rays   hypotension Due to BP meds + volume depletion - resolved w/ volume expansion  Acute renal failure Improving w/ volume expansion - FeNa 2.05% - cont to follow trend - keep hydrated  Hyperkalemia Resolved w/ volume expansion/recovery of renal fxn  +UA/?UTI urine cx reveals >100K E coli - begin empiric tx - follow sensitivities  Normocytic anemia Hgb falling slowly with hydration - will need to follow trend  PAD s/p bilateral comon iliac artery stenting in 2002 - known significant R SFA disease   DM Uses insulin pump at home so have continued here -  CBG well controlled at present   HTN Becoming an issue now that volume expanded - resume home meds in stepwise fashion  Afib/flutter Not a coumadin candidate due to frequent falls - NSR at present  Lonia Blood, MD [facility] Office  [phone] Pager [phone]  On-Call/Text Page:      [email or web address]      password [facility]

## 2011-12-10 NOTE — Progress Notes (Signed)
Pt. Arrived to unit 3000 with BP of 218/84 manual. Pt. c/o sudden onset nausea, now vomiting upon arrival. Zofran and Hydralazine given IV. Dr. Sharon Seller paged.

## 2011-12-10 NOTE — Progress Notes (Signed)
Utilization Review Completed.Emma Yu T4/22/2013   

## 2011-12-10 NOTE — Progress Notes (Signed)
See OT note.    Shiree Altemus, PT [phone]  

## 2011-12-10 NOTE — Progress Notes (Signed)
Text messaged CXR and KUB results to Dr. Sharon Seller. Awaiting return call. Oncoming RN made aware.

## 2011-12-11 LAB — CBC
HCT: 31.8 % — ABNORMAL LOW (ref 36.0–46.0)
Hemoglobin: 10.1 g/dL — ABNORMAL LOW (ref 12.0–15.0)
MCV: 94.6 fL (ref 78.0–100.0)
RDW: 15.7 % — ABNORMAL HIGH (ref 11.5–15.5)
WBC: 18.4 10*3/uL — ABNORMAL HIGH (ref 4.0–10.5)

## 2011-12-11 LAB — BASIC METABOLIC PANEL
BUN: 28 mg/dL — ABNORMAL HIGH (ref 6–23)
CO2: 26 mEq/L (ref 19–32)
GFR calc non Af Amer: 48 mL/min — ABNORMAL LOW (ref >90)
Glucose, Bld: 206 mg/dL — ABNORMAL HIGH (ref 70–99)
Potassium: 4.1 mEq/L (ref 3.5–5.1)

## 2011-12-11 MED ORDER — FUROSEMIDE 10 MG/ML IJ SOLN
20.0000 mg | Freq: Two times a day (BID) | INTRAMUSCULAR | Status: DC
  Filled 2011-12-11: qty 2

## 2011-12-11 MED ORDER — INSULIN ASPART 100 UNIT/ML ~~LOC~~ SOLN
0.0000 [IU] | Freq: Three times a day (TID) | SUBCUTANEOUS | Status: DC
  Administered 2011-12-11: 4 [IU] via SUBCUTANEOUS

## 2011-12-11 MED ORDER — PANTOPRAZOLE SODIUM 40 MG PO TBEC
40.0000 mg | DELAYED_RELEASE_TABLET | Freq: Every day | ORAL | Status: DC
  Administered 2011-12-11 – 2011-12-18 (×8): 40 mg via ORAL
  Filled 2011-12-11 (×7): qty 1

## 2011-12-11 MED ORDER — POTASSIUM CHLORIDE CRYS ER 10 MEQ PO TBCR
10.0000 meq | EXTENDED_RELEASE_TABLET | Freq: Two times a day (BID) | ORAL | Status: DC
  Administered 2011-12-11 – 2011-12-12 (×3): 10 meq via ORAL
  Filled 2011-12-11 (×4): qty 1

## 2011-12-11 MED ORDER — FUROSEMIDE 10 MG/ML IJ SOLN
10.0000 mg | Freq: Once | INTRAMUSCULAR | Status: AC
  Administered 2011-12-11: 10 mg via INTRAVENOUS
  Filled 2011-12-11: qty 1

## 2011-12-11 MED ORDER — NON FORMULARY: 320.0000 mg | Freq: Every day | Status: DC

## 2011-12-11 MED ORDER — INSULIN ASPART 100 UNIT/ML ~~LOC~~ SOLN
7.0000 [IU] | Freq: Three times a day (TID) | SUBCUTANEOUS | Status: DC
  Administered 2011-12-12 – 2011-12-14 (×6): 7 [IU] via SUBCUTANEOUS
  Administered 2011-12-14: 09:00:00 via SUBCUTANEOUS
  Administered 2011-12-14 – 2011-12-18 (×11): 7 [IU] via SUBCUTANEOUS

## 2011-12-11 MED ORDER — INSULIN GLARGINE 100 UNIT/ML ~~LOC~~ SOLN
22.0000 [IU] | Freq: Every day | SUBCUTANEOUS | Status: DC
  Administered 2011-12-11 – 2011-12-17 (×7): 22 [IU] via SUBCUTANEOUS

## 2011-12-11 MED ORDER — INSULIN ASPART 100 UNIT/ML ~~LOC~~ SOLN
0.0000 [IU] | Freq: Every day | SUBCUTANEOUS | Status: DC
  Administered 2011-12-12 – 2011-12-17 (×2): 2 [IU] via SUBCUTANEOUS

## 2011-12-11 MED ORDER — FUROSEMIDE 10 MG/ML IJ SOLN
40.0000 mg | Freq: Two times a day (BID) | INTRAMUSCULAR | Status: DC
  Administered 2011-12-11 – 2011-12-12 (×2): 40 mg via INTRAVENOUS
  Filled 2011-12-11 (×3): qty 4

## 2011-12-11 MED ORDER — FUROSEMIDE 10 MG/ML IJ SOLN
20.0000 mg | Freq: Two times a day (BID) | INTRAMUSCULAR | Status: DC
  Administered 2011-12-11: 20 mg via INTRAVENOUS
  Filled 2011-12-11 (×3): qty 2

## 2011-12-11 MED ORDER — VALSARTAN 160 MG PO TABS
320.0000 mg | ORAL_TABLET | Freq: Every day | ORAL | Status: DC
  Administered 2011-12-11: 320 mg via ORAL
  Filled 2011-12-11 (×2): qty 2

## 2011-12-11 NOTE — Progress Notes (Signed)
PHARMACIST - PHYSICIAN COMMUNICATION DR:   Sharon Seller CONCERNING:  IV to Oral Route Change Policy  RECOMMENDATION: This patient is receiving protonix by the intravenous route.  Based on criteria approved by the Pharmacy and Therapeutics Committee, the medication is/are being converted to the equivalent oral dose form(s).  DESCRIPTION: These criteria include:  The patient is eating (either orally or via tube) and/or has been taking other orally administered medications for a least 24 hours  If you have questions about this conversion, please contact the Pharmacy Department   Lovenia Kim Pharm.D., BCPS Clinical Pharmacist 12/11/2011 2:38 PM Pager: [phone] Phone: [phone]

## 2011-12-11 NOTE — Plan of Care (Signed)
Problem: Phase I Progression Outcomes Goal: OOB as tolerated unless otherwise ordered Outcome: Not Progressing Currently to lethargic and unsteady to ambulate. Bedpan offered every two hours.  Goal: Hemodynamically stable Outcome: Not Progressing Currently treating elevated blood pressure Systolic over 180 several times.

## 2011-12-11 NOTE — Progress Notes (Signed)
Physical Therapy Treatment Patient Details Name: Emma Yu MRN: [id number] DOB: [birth year] Today's Date: 12/11/2011 Time: 4098-1191 PT Time Calculation (min): 20 min  PT Assessment / Plan / Recommendation Comments on Treatment Session  pt somewhat confused/disoriented and not willing to work or push herself due to being tired, but did agree to get OOB and work through some bed mobility    Follow Up Recommendations  Skilled nursing facility    Equipment Recommendations  Defer to next venue    Frequency     Plan Discharge plan remains appropriate    Precautions / Restrictions Precautions Precautions: Fall Restrictions Weight Bearing Restrictions: No   Pertinent Vitals/Pain     Mobility  Bed Mobility Bed Mobility: Supine to Sit;Sitting - Scoot to Edge of Bed Supine to Sit: 4: Min guard;HOB flat Sitting - Scoot to [employer] of Bed: 4: Min guard Details for Bed Mobility Assistance: cues for encouragement and sequencing Transfers Transfers: Sit to Stand;Stand to [employer] Transfers Sit to Stand: 4: Min assist;With upper extremity assist;From bed Stand to Sit: 4: Min assist;With upper extremity assist;To chair/3-in-1 Stand Pivot Transfers: 4: Min assist;With armrests Details for Transfer Assistance: vc's for hand placement/ safety; stability assist through out, but generally capable of doing more. Ambulation/Gait Ambulation/Gait Assistance: Not tested (comment) (pt could have done more but refused due to being "tired") Stairs: No Wheelchair Mobility Wheelchair Mobility: No    Exercises     PT Goals Acute Rehab PT Goals Time For Goal Achievement: 12/24/11 Potential to Achieve Goals: Good PT Goal: Supine/Side to Sit - Progress: Progressing toward goal PT Goal: Sit to Stand - Progress: Progressing toward goal PT Goal: Stand to Sit - Progress: Progressing toward goal PT Goal: Ambulate - Progress: Not progressing PT Goal: Up/Down Stairs - Progress: Not  met  Visit Information  Last PT Received On: 12/11/11 Assistance Needed: +1    Subjective Data  Subjective: I'm so tired Patient Stated Goal: Get my strength back.     Cognition  Overall Cognitive Status: Impaired Arousal/Alertness: Awake/alert Orientation Level: Disoriented to;Place;Time Behavior During Session: WFL for tasks performed    Balance  Balance Balance Assessed: Yes Static Sitting Balance Static Sitting - Balance Support: Right upper extremity supported;Left upper extremity supported;Feet supported Static Sitting - Level of Assistance: 5: Stand by assistance  End of Session PT - End of Session Activity Tolerance: Patient limited by fatigue Patient left: in chair;with call bell/phone within reach Nurse Communication: Mobility status    Emberlie Gotcher, Eliseo Gum 12/11/2011, 10:34 AM  12/11/2011  [place] Bing, PT [phone] (pager)

## 2011-12-11 NOTE — Progress Notes (Signed)
[facility] [place] TEAM 1 - Stepdown/ICU TEAM  PCP:  Sanda Linger, MD  Subjective: 78 y.o. female w/ medical history of PAD; Aortic stenosis; Diabetes mellitus; Hypertension; Hyperlipidemia; Venous insufficiency; CHF; Atrial fib/flutter; and DJD of hip who resented with a tremor resulting in repeated falls.  She fell Saturday AM out of the bed and hit her head resulting in laceration needing stiches. Was seen at [facility] where they put in a few stiches and discharged her home. She started to have some diarhea and was feeling week and was brought in to [facility] ER where she was found to be anemic and dehydrated with acute renal failure.  The patient is restless and trying to get out of bed. [name] well oriented, though, when questioned. Responses are short. She has no complaints. I have spoken to her son, whom she lives with, who tells me that she has poor short term memory and is sometimes confused at home.  Events of last night noted. She became hypoxic and lethargic.  Objective: Weight change: -0.7 kg (-1 lb 8.7 oz)  Intake/Output Summary (Last 24 hours) at 12/11/11 1820 Last data filed at 12/11/11 1800  Gross per 24 hour  Intake   1340 ml  Output    675 ml  Net    665 ml   Blood pressure 156/46, pulse 117, temperature 98.8 F (37.1 C), temperature source Oral, resp. rate 22, height 5\' 4"  (1.626 m), weight 65.9 kg (145 lb 4.5 oz), SpO2 95.00%.  CBG (last 3)   Basename 12/11/11 1650 12/11/11 1153 12/11/11 0825  GLUCAP 192* 230* 252*   Physical Exam: General: No acute respiratory distress Lungs: coarse crackles at b/l bases Cardiovascular: Regular rate and rhythm with 2/6 holosystolic M w/o gallup or rub Abdomen: Nontender, nondistended, soft, bowel sounds positive, no rebound, no ascites, no appreciable mass Extremities: No significant cyanosis, clubbing, or edema bilateral lower extremities  Lab Results:  [facility] 12/11/11 1347 12/10/11 2201 12/10/11 0405    NA 143 145 143  K 4.1 4.5 4.4  CL 105 109 109  CO2 26 23 27   GLUCOSE 206* 252* 105*  BUN 28* 22 38*  CREATININE 1.05 0.94 1.18*  CALCIUM 10.0 9.3 8.7  MG -- -- 2.0  PHOS -- -- 2.4    Basename 12/11/11 1347 12/10/11 2201 12/10/11 0405 12/08/11 2346  WBC 18.4* 15.7* 5.8 --  NEUTROABS -- 14.3* -- 5.6  HGB 10.1* 10.3* 8.4* --  HCT 31.8* 32.8* 26.6* --  MCV 94.6 95.6 95.7 --  PLT 225 228 140* --    Basename 12/09/11 2025 12/09/11 1423 12/09/11 0903  CKTOTAL 85 89 93  CKMB 1.6 2.1 2.2  CKMBINDEX -- -- --  TROPONINI <0.30 <0.30 <0.30   Micro Results: Recent Results (from the past 240 hour(s))  URINE CULTURE     Status: Normal (Preliminary result)   Collection Time   12/09/11  5:56 AM      Component Value Range Status Comment   Specimen Description URINE, CLEAN CATCH   Final    Special Requests NONE   Final    Culture  Setup Time [id number]   Final    Colony Count >=100,000 COLONIES/ML   Final    Culture     Final    Value: ESCHERICHIA COLI     ENTEROCOCCUS SPECIES   Report Status PENDING   Incomplete    Organism ID, Bacteria ESCHERICHIA COLI   Final   MRSA PCR SCREENING  Status: Normal   Collection Time   12/09/11  8:42 AM      Component Value Range Status Comment   MRSA by PCR NEGATIVE  NEGATIVE  Final     Studies/Results: All recent x-ray/radiology reports have been reviewed in detail.   Medications: I have reviewed the patient's complete medication list.  Assessment/Plan:  Severe dehydration Clinically resolved - now fluid overloaded  Pulmonary edema Cont lasix 40 BID for 3 more doses and then reassess  Does not use O2 at home ECHO 4/22- Mod LVH, EF 65-70%, mod AS   dementia with restlessness and mild confusion Sitter at bedside. Avoid sedatives  Neck pain s/p fall Was seen post-fall in ER at [facility] - will obtain records to determine if pt has had x-rays   hypotension Due to BP meds + volume depletion - resolved w/ volume  expansion  Acute renal failure Improved - FeNa 2.05% - cont to follow trend - need to diurese as above.   Hyperkalemia Resolved w/ volume expansion/recovery of renal fxn  +UA/?UTI urine cx reveals >100K E coli - sensitive to Rocephin which will be continued- started on 4/22  Normocytic anemia Hgb falling slowly with hydration - will need to follow trend  PAD s/p bilateral comon iliac artery stenting in 2002 - known significant R SFA disease   DM Uses insulin pump at home but due to confusion her son would like the pump to be off and to take it home - due to very poor PO intake - have switched to long acting Lantus and meal coverage Novolog  HTN Becoming an issue now that volume expanded - resumed Diovan today  Afib/flutter Not a coumadin candidate due to frequent falls - NSR at present  Calvert Cantor, MD [facility] Office  [phone] Pager [phone]  On-Call/Text Page:      [email or web address]      password [facility]

## 2011-12-11 NOTE — Progress Notes (Signed)
Inpatient Diabetes Program Recommendations  AACE/ADA: New Consensus Statement on Inpatient Glycemic Control (2009)  Target Ranges:  Prepandial:   less than 140 mg/dL      Peak postprandial:   less than 180 mg/dL (1-2 hours)      Critically ill patients:  140 - 180 mg/dL   Reason for Visit: Consult for insulin pump  Results for Emma Yu, Emma Yu (MRN [id number]) as of 12/11/2011 15:34  Ref. Range 12/10/2011 20:03 12/11/2011 01:35 12/11/2011 08:25 12/11/2011 11:53  Glucose-Capillary Latest Range: 70-99 mg/dL 409 (H) 811 (H) 914 (H) 230 (H)    Inpatient Diabetes Program Recommendations Insulin - Basal: Recommend Lantus 22 units QHS (pump basal rate is .9/hr) Insulin - Meal Coverage: Novolog 8 units tidwc if pt eats > 50% meal. (pump boluses 16 units with meals)  Note: Pt confused and trying to bolus insulin via pump.  RN "hid" pump in back area so pt would not be tempted to change settings.  Pt's son in agreement with removal of pump.  He will take pump home after disconnecting it and will reassess on a daily basis.  RN reports pt has poor po intake today.  Will continue to monitor.  Thank you.  Ailene Ards, RD, LDN, CDE Inpatient Diabetes Coordinator

## 2011-12-11 NOTE — Evaluation (Signed)
Occupational Therapy Evaluation Patient Details Name: Emma Yu MRN: [id number] DOB: June 22, 1927 Today's Date: 12/11/2011 Time: 1478-2956 OT Time Calculation (min): 30 min  OT Assessment / Plan / Recommendation Clinical Impression  Pt presents to OT with decreased I with all ADL activity s/p admission to hospital with hypotension. Pt will beneifit from skilled OT to increase I with ADL activity and return to PLOF. Pt will likely need ST SNF to regain I with  ADL activity.     OT Assessment  Patient needs continued OT Services    Follow Up Recommendations  Skilled nursing facility    Equipment Recommendations  Defer to next venue    Frequency Min 2X/week           ADL  Eating/Feeding: Performed;Set up Where Assessed - Eating/Feeding: Bed level;Other (comment) (HOB raised.  Repositioned bed and table for pt to access) Grooming: Wash/dry hands;Performed Where Assessed - Grooming: Supine, head of bed up Upper Body Bathing: Simulated;Moderate assistance Where Assessed - Upper Body Bathing: Supine, head of bed up Lower Body Bathing: Maximal assistance Where Assessed - Lower Body Bathing: Supine, head of bed up Upper Body Dressing: Simulated;Moderate assistance Where Assessed - Upper Body Dressing: Supine, head of bed up Lower Body Dressing: Simulated;Maximal assistance Where Assessed - Lower Body Dressing: Supine, head of bed up Toilet Transfer: Not assessed Toilet Transfer Method: Not assessed Toileting - Clothing Manipulation: Not assessed Toileting - Hygiene: Not assessed Tub/Shower Transfer: Not assessed Tub/Shower Transfer Method: Not assessed ADL Comments: Bilateral hands with edema in fingers and hand.  Pt was sitting with hands near her thighs. PLaced BUE on pillows which pt threw on floor while figiting. OT replaced pillows and made RN aware.     OT Goals Acute Rehab OT Goals OT Goal Formulation: With patient Time For Goal Achievement: 12/25/11 Potential  to Achieve Goals: Fair ADL Goals Pt Will Perform Eating: with set-up;Unsupported;Sitting, chair ADL Goal: Eating - Progress: Goal set today Pt Will Perform Grooming: with set-up;Sitting, chair;Unsupported ADL Goal: Grooming - Progress: Goal set today Pt Will Transfer to Toilet: with min assist;Comfort height toilet ADL Goal: Toilet Transfer - Progress: Goal set today Arm Goals Pt Will Perform AROM: with supervision, verbal cues required/provided;10 reps;to maintain range of motion Arm Goal: AROM - Progress: Goal set today  Visit Information       Subjective Data  Subjective: I just dont want any of my lunch- do you want it? Patient Stated Goal: did not state   Prior Functioning  Home Living Lives With: Son Available Help at Discharge: Family;Available PRN/intermittently (Son works 5am-1:30pm) Type of Home: House Home Access: Stairs to enter Secretary/administrator of Steps: 1, 1 Entrance Stairs-Rails: None Home Layout: One level Bathroom Shower/Tub: Engineer, manufacturing systems: Standard Home Adaptive Equipment: Environmental consultant - rolling Prior Function Level of Independence: Needs assistance Needs Assistance: Meal Prep;Light Housekeeping Able to Take Stairs?: Yes (With A) Driving: No Vocation: Retired Musician: No difficulties Dominant Hand: Right    Cognition  Overall Cognitive Status: Impaired Arousal/Alertness: Awake/alert Orientation Level: Disoriented to;Place;Time Behavior During Session: WFL for tasks performed    Extremity/Trunk Assessment Right Upper Extremity Assessment RUE ROM/Strength/Tone: Within functional levels RUE Coordination:  (tremors noted during self feeding task) Left Upper Extremity Assessment LUE ROM/Strength/Tone: Within functional levels LUE Coordination:  (tremors noted during self feeding)            End of Session OT - End of Session Activity Tolerance: Treatment limited secondary to agitation Patient left: in  bed;with bed alarm set;with nursing in room   [facility], [place] Kung 12/11/2011, 2:48 PM

## 2011-12-12 ENCOUNTER — Inpatient Hospital Stay (HOSPITAL_COMMUNITY): Payer: Medicare Other

## 2011-12-12 LAB — CBC
HCT: 31.3 % — ABNORMAL LOW (ref 36.0–46.0)
Hemoglobin: 9.8 g/dL — ABNORMAL LOW (ref 12.0–15.0)
MCH: 30 pg (ref 26.0–34.0)
MCV: 95.7 fL (ref 78.0–100.0)
Platelets: 235 10*3/uL (ref 150–400)
RBC: 3.27 MIL/uL — ABNORMAL LOW (ref 3.87–5.11)

## 2011-12-12 LAB — GLUCOSE, CAPILLARY: Glucose-Capillary: 170 mg/dL — ABNORMAL HIGH (ref 70–99)

## 2011-12-12 LAB — BASIC METABOLIC PANEL
BUN: 31 mg/dL — ABNORMAL HIGH (ref 6–23)
CO2: 25 mEq/L (ref 19–32)
Calcium: 9.7 mg/dL (ref 8.4–10.5)
Chloride: 104 mEq/L (ref 96–112)
Creatinine, Ser: 1.12 mg/dL — ABNORMAL HIGH (ref 0.50–1.10)
Glucose, Bld: 196 mg/dL — ABNORMAL HIGH (ref 70–99)

## 2011-12-12 LAB — URINE CULTURE

## 2011-12-12 MED ORDER — BIOTENE DRY MOUTH MT LIQD
15.0000 mL | Freq: Two times a day (BID) | OROMUCOSAL | Status: DC
  Administered 2011-12-12 – 2011-12-16 (×8): 15 mL via OROMUCOSAL

## 2011-12-12 MED ORDER — SODIUM CHLORIDE 0.9 % IV BOLUS (SEPSIS)
250.0000 mL | Freq: Once | INTRAVENOUS | Status: AC
  Administered 2011-12-12: 250 mL via INTRAVENOUS

## 2011-12-12 MED ORDER — DILTIAZEM LOAD VIA INFUSION
10.0000 mg | Freq: Once | INTRAVENOUS | Status: AC | PRN
  Filled 2011-12-12: qty 10

## 2011-12-12 MED ORDER — DILTIAZEM HCL 100 MG IV SOLR
5.0000 mg/h | INTRAVENOUS | Status: AC
  Administered 2011-12-12: 5 mg/h via INTRAVENOUS
  Administered 2011-12-12 – 2011-12-13 (×4): 15 mg/h via INTRAVENOUS
  Filled 2011-12-12 (×3): qty 100

## 2011-12-12 MED ORDER — DILTIAZEM LOAD VIA INFUSION
10.0000 mg | Freq: Once | INTRAVENOUS | Status: AC
  Administered 2011-12-12: 10 mg via INTRAVENOUS
  Filled 2011-12-12: qty 10

## 2011-12-12 MED ORDER — SODIUM CHLORIDE 0.9 % IV SOLN
INTRAVENOUS | Status: DC
  Administered 2011-12-12: 10 mL/h via INTRAVENOUS

## 2011-12-12 NOTE — Progress Notes (Signed)
[facility] [place] TEAM 1 - Stepdown/ICU TEAM  PCP:  Sanda Linger, MD  Subjective: 78 y.o. female w/ medical history of PAD; Aortic stenosis; Diabetes mellitus; Hypertension; Hyperlipidemia; Venous insufficiency; CHF; Atrial fib/flutter; and DJD of hip who resented with a tremor resulting in repeated falls.  She fell Saturday AM out of the bed and hit her head resulting in laceration needing stiches. Was seen at [facility] where they put in a few stiches and discharged her home. She started to have some diarhea and was feeling week and was brought in to [facility] ER where she was found to be anemic and dehydrated with acute renal failure.  The patient is alert and oriented this morning.  She c/o some pain in the midline of her cervical spine.  She denies HA, n/v, abdom pain, sob, or cp.  She denies numbness or tingling in her arms or legs.  Her son, with whom she lives, has stated that she has poor short term memory and is sometimes confused at home.    Objective: Weight change:   Intake/Output Summary (Last 24 hours) at 12/12/11 1042 Last data filed at 12/12/11 0845  Gross per 24 hour  Intake   1280 ml  Output   1050 ml  Net    230 ml   Blood pressure 91/54, pulse 120, temperature 99 F (37.2 C), temperature source Oral, resp. rate 19, height 5\' 4"  (1.626 m), weight 65.9 kg (145 lb 4.5 oz), SpO2 95.00%.  CBG (last 3)   Basename 12/12/11 0736 12/11/11 2142 12/11/11 1650  GLUCAP 170* 149* 192*   Physical Exam: General: No acute respiratory distress at rest Lungs: lungs are clear to exam - no wheeze  Cardiovascular: irreg irreg with 2/6 holosystolic M w/o gallup or rub Abdomen: Nontender, nondistended, soft, bowel sounds positive, no rebound, no ascites, no appreciable mass Extremities: No significant cyanosis, clubbing, or edema bilateral lower extremities Neuro:  5/5 strength B U&LE - A&O x4 at time of my exam  Lab Results:  Basename 12/12/11 0500 12/11/11  1347 12/10/11 2201 12/10/11 0405  NA 141 143 145 --  K 4.1 4.1 4.5 --  CL 104 105 109 --  CO2 25 26 23  --  GLUCOSE 196* 206* 252* --  BUN 31* 28* 22 --  CREATININE 1.12* 1.05 0.94 --  CALCIUM 9.7 10.0 9.3 --  MG -- -- -- 2.0  PHOS -- -- -- 2.4    Basename 12/12/11 0500 12/11/11 1347 12/10/11 2201  WBC 13.9* 18.4* 15.7*  NEUTROABS -- -- 14.3*  HGB 9.8* 10.1* 10.3*  HCT 31.3* 31.8* 32.8*  MCV 95.7 94.6 95.6  PLT 235 225 228    Basename 12/09/11 2025 12/09/11 1423  CKTOTAL 85 89  CKMB 1.6 2.1  CKMBINDEX -- --  TROPONINI <0.30 <0.30   Micro Results: Recent Results (from the past 240 hour(s))  URINE CULTURE     Status: Normal   Collection Time   12/09/11  5:56 AM      Component Value Range Status Comment   Specimen Description URINE, CLEAN CATCH   Final    Special Requests NONE   Final    Culture  Setup Time [id number]   Final    Colony Count >=100,000 COLONIES/ML   Final    Culture     Final    Value: ESCHERICHIA COLI     ENTEROCOCCUS SPECIES   Report Status 12/12/2011 FINAL   Final    Organism ID, Bacteria ESCHERICHIA COLI  Final    Organism ID, Bacteria ENTEROCOCCUS SPECIES   Final   MRSA PCR SCREENING     Status: Normal   Collection Time   12/09/11  8:42 AM      Component Value Range Status Comment   MRSA by PCR NEGATIVE  NEGATIVE  Final     Studies/Results: All recent x-ray/radiology reports have been reviewed in detail.   Medications: I have reviewed the patient's complete medication list.  Assessment/Plan:  Afib w/ RVR cardizem gtt has been initiated - given recent fall w/ head trauma anticoag would carry w/ it too high a risk for ICH - d/c BB for now due to fear of brady and w/ episodic hypotension - not a long term/outpt coumadin candidate due to frequent falls  Volume status Pt is behaving like someone with severe AoS, rapidly swinging from volume depletion to volume overload / hypotension to hypertension - her echo, however, suggest only moderate  AoS and preserved LV systolic fxn - will tread lightly in regards to diuretics, as well as  IVF - today, however, she does appear to be somewhat dry so I will hold on futher lasix at this time and give her a very small fluid challenge  Pulmonary edema See discussion above - does not use O2 at home - ECHO 4/22 Mod LVH, EF 65-70%, mod AoS  dementia with restlessness and mild confusion Sitter at bedside - avoid sedatives - is likely her baseline  Neck pain s/p fall Was seen post-fall in ER at [facility] - recoreds obtained reveal normal CT head (except scalp wound) but no cervical spine exam was accomplished - she has not neurologic deficits at present on exam - will check CT of cervical spine to r/o occult fx  hypotension Due to BP meds + volume depletion - resolved w/ volume expansion - see discussion above - has had some transient hypotension again this morning, along w/ afib - will give very modest fluid challenge  Acute renal failure Renal function is fluctuating rapidly - d/c ARB/ACE for now - keep hydrated - hold diuretic for now (may need to resume in AM)  Hyperkalemia Resolved w/ volume expansion/recovery of renal fxn  +UA/?UTI urine cx reveals >100K E coli - sensitive to Rocephin which will be continued - started on 4/22  Normocytic anemia Hgb appears to be stabilizing  PAD s/p bilateral comon iliac artery stenting in 2002 - known significant R SFA disease   DM Uses insulin pump at home but due to confusion her son would like the pump to be off and to take it home - is now on long acting Lantus and meal coverage Novolog  HTN See discussion above  Lonia Blood, MD [facility] Office  [phone] Pager [phone]  On-Call/Text Page:      [email or web address]      password [facility]

## 2011-12-12 NOTE — Progress Notes (Signed)
CSW aware of patient potential disposition needs and recommendations for snf. CSW will follow up and complete psychosocial assessment tomorrow.   Catha Gosselin, Theresia Majors  [phone] .12/12/2011 1701pm

## 2011-12-12 NOTE — Progress Notes (Signed)
Insulin pump discontinued.  On Lantus 22 units QHS and Novolog 7 units tidwc.  CBGs look better.  Will still need correction insulin.  Please order Novolog moderate tidwc.  Continues poor po intake and confustion slightly improved per RN.  Will continue to follow.  Results for Emma Yu, Emma Yu (MRN [id number]) as of 12/12/2011 09:36  Ref. Range 12/11/2011 16:50 12/11/2011 21:42 12/12/2011 07:36  Glucose-Capillary Latest Range: 70-99 mg/dL 027 (H) 253 (H) 664 (H)

## 2011-12-13 ENCOUNTER — Encounter (HOSPITAL_COMMUNITY): Payer: Self-pay | Admitting: Cardiology

## 2011-12-13 DIAGNOSIS — I050 Rheumatic mitral stenosis: Secondary | ICD-10-CM | POA: Insufficient documentation

## 2011-12-13 DIAGNOSIS — I2510 Atherosclerotic heart disease of native coronary artery without angina pectoris: Secondary | ICD-10-CM | POA: Insufficient documentation

## 2011-12-13 DIAGNOSIS — I4891 Unspecified atrial fibrillation: Secondary | ICD-10-CM | POA: Insufficient documentation

## 2011-12-13 DIAGNOSIS — I350 Nonrheumatic aortic (valve) stenosis: Secondary | ICD-10-CM | POA: Insufficient documentation

## 2011-12-13 LAB — GLUCOSE, CAPILLARY
Glucose-Capillary: 245 mg/dL — ABNORMAL HIGH (ref 70–99)
Glucose-Capillary: 177 mg/dL — ABNORMAL HIGH (ref 70–99)
Glucose-Capillary: 205 mg/dL — ABNORMAL HIGH (ref 70–99)

## 2011-12-13 LAB — CBC
Hemoglobin: 9.3 g/dL — ABNORMAL LOW (ref 12.0–15.0)
MCH: 29.4 pg (ref 26.0–34.0)
MCV: 94.3 fL (ref 78.0–100.0)
RBC: 3.16 MIL/uL — ABNORMAL LOW (ref 3.87–5.11)

## 2011-12-13 LAB — BASIC METABOLIC PANEL
CO2: 26 mEq/L (ref 19–32)
Calcium: 9.5 mg/dL (ref 8.4–10.5)
Glucose, Bld: 187 mg/dL — ABNORMAL HIGH (ref 70–99)
Potassium: 4.6 mEq/L (ref 3.5–5.1)
Sodium: 139 mEq/L (ref 135–145)

## 2011-12-13 MED ORDER — DILTIAZEM HCL 90 MG PO TABS
90.0000 mg | ORAL_TABLET | Freq: Four times a day (QID) | ORAL | Status: DC
  Administered 2011-12-13 – 2011-12-14 (×5): 90 mg via ORAL
  Filled 2011-12-13 (×8): qty 1

## 2011-12-13 NOTE — Progress Notes (Signed)
[facility] [place] TEAM 1 - Stepdown/ICU TEAM  PCP:  Sanda Linger, MD  Subjective: 78 y.o. female w/ medical history of PAD; Aortic stenosis; Diabetes mellitus; Hypertension; Hyperlipidemia; Venous insufficiency; CHF; Atrial fib/flutter; and DJD of hip who resented with a tremor resulting in repeated falls.  She fell Saturday AM out of the bed and hit her head resulting in laceration needing stiches. Was seen at [facility] where they put in a few stiches and discharged her home. She started to have some diarhea and was feeling week and was brought in to [facility] ER where she was found to be anemic and dehydrated with acute renal failure. Per RN pt has not voided all day. Pt states she feels no urge to void. bladder scan shows> 450 cc in bladder.    Objective: Weight change:   Intake/Output Summary (Last 24 hours) at 12/13/11 1534 Last data filed at 12/13/11 1300  Gross per 24 hour  Intake   1510 ml  Output    500 ml  Net   1010 ml   Blood pressure 104/80, pulse 83, temperature 98.4 F (36.9 C), temperature source Oral, resp. rate 17, height 5\' 4"  (1.626 m), weight 66.1 kg (145 lb 11.6 oz), SpO2 95.00%.  CBG (last 3)   Basename 12/13/11 1309 12/13/11 0724 12/12/11 2129  GLUCAP 245* 172* 213*   Physical Exam: General: No acute respiratory distress at rest Lungs: lungs are clear to exam - no wheeze  Cardiovascular: irreg irreg with 2/6 holosystolic M w/o gallup or rub Abdomen: Nontender, nondistended, soft, bowel sounds positive, no rebound, no ascites, no appreciable mass Extremities: No significant cyanosis, clubbing, or edema bilateral lower extremities Neuro:  5/5 strength B U&LE - A&O x4 at time of my exam  Lab Results:  [facility] 12/13/11 0523 12/12/11 0500 12/11/11 1347  NA 139 141 143  K 4.6 4.1 4.1  CL 102 104 105  CO2 26 25 26   GLUCOSE 187* 196* 206*  BUN 53* 31* 28*  CREATININE 2.09* 1.12* 1.05  CALCIUM 9.5 9.7 10.0  MG -- -- --  PHOS -- -- --     Basename 12/13/11 0523 12/12/11 0500 12/11/11 1347 12/10/11 2201  WBC 10.6* 13.9* 18.4* --  NEUTROABS -- -- -- 14.3*  HGB 9.3* 9.8* 10.1* --  HCT 29.8* 31.3* 31.8* --  MCV 94.3 95.7 94.6 --  PLT 237 235 225 --   No results found for this basename: CKTOTAL:3,CKMB:3,CKMBINDEX:3,TROPONINI:3 in the last 72 hours Micro Results: Recent Results (from the past 240 hour(s))  URINE CULTURE     Status: Normal   Collection Time   12/09/11  5:56 AM      Component Value Range Status Comment   Specimen Description URINE, CLEAN CATCH   Final    Special Requests NONE   Final    Culture  Setup Time [id number]   Final    Colony Count >=100,000 COLONIES/ML   Final    Culture     Final    Value: ESCHERICHIA COLI     ENTEROCOCCUS SPECIES   Report Status 12/12/2011 FINAL   Final    Organism ID, Bacteria ESCHERICHIA COLI   Final    Organism ID, Bacteria ENTEROCOCCUS SPECIES   Final   MRSA PCR SCREENING     Status: Normal   Collection Time   12/09/11  8:42 AM      Component Value Range Status Comment   MRSA by PCR NEGATIVE  NEGATIVE  Final  Studies/Results: All recent x-ray/radiology reports have been reviewed in detail.   Medications: I have reviewed the patient's complete medication list.  Assessment/Plan:  Afib w/ RVR cardizem gtt has been initiated - given recent fall w/ head trauma anticoag would carry w/ it too high a risk for ICH - convert to PO cardizem - not a long term/outpt coumadin candidate due to frequent falls [place] cards consult  Volume status Pt is behaving like someone with severe AoS, rapidly swinging from volume depletion to volume overload / hypotension to hypertension - her echo, however, suggest only moderate AoS and preserved LV systolic fxn - will tread lightly in regards to diuretics, as well as  IVF - Holding diuretics and IVF- poor po fluid intake, encourage fluids.   Pulmonary edema See discussion above - does not use O2 at home - ECHO 4/22 Mod LVH, EF  65-70%, mod AoS  dementia with restlessness and mild confusion Sitter at bedside - avoid sedatives - is likely her baseline  Neck pain s/p fall Was seen post-fall in ER at [facility] - recoreds obtained reveal normal CT head (except scalp wound) but no cervical spine exam was accomplished - she has not neurologic deficits at present on exam - will check CT of cervical spine to r/o occult fx  hypotension Due to BP meds + volume depletion -  D/c hydralazine PO   Acute renal failure Renal function is fluctuating rapidly - d/c ARB/ACE for now - keep hydrated - hold diuretic for now   Hyperkalemia Resolved w/ volume expansion/recovery of renal fxn  +UA/?UTI urine cx reveals >100K E coli and enterococcus- sensitive to Rocephin - will d/c today - started on 4/22  Normocytic anemia Hgb appears to be stabilizing  PAD s/p bilateral comon iliac artery stenting in 2002 - known significant R SFA disease   DM Uses insulin pump at home but due to confusion her son would like the pump to be off and to take it home - is now on long acting Lantus and meal coverage Novolog  HTN See discussion above  Calvert Cantor, MD [facility] Office  [phone] Pager [phone]  On-Call/Text Page:      [email or web address]      password [facility]

## 2011-12-13 NOTE — Progress Notes (Signed)
Physical Therapy Treatment Patient Details Name: Emma Yu MRN: [id number] DOB: Dec 25, 1926 Today's Date: 12/13/2011 Time: 7829-5621 PT Time Calculation (min): 23 min  PT Assessment / Plan / Recommendation Comments on Treatment Session  Patient with improved particpation with PT today and increased functional activity tolerance.    Follow Up Recommendations  Skilled nursing facility    Equipment Recommendations  Defer to next venue    Frequency Min 3X/week   Plan Discharge plan remains appropriate;Frequency remains appropriate    Precautions / Restrictions Precautions Precautions: Fall Restrictions Weight Bearing Restrictions: No   Pertinent Vitals/Pain BP - 90/29 sitting in chair. Chair reclined and exercises for legs led to incr. To 91/36 (MAP 47). Nurse states diastolic pressures low in bed also and so will monitor in chair.    Mobility  Bed Mobility Supine to Sit: 4: Min assist Sitting - Scoot to Edge of Bed: 4: Min guard Details for Bed Mobility Assistance: Patient with difficulty initiating trunk. Once movement started patient able to complete range Transfers Sit to Stand: 4: Min assist;With upper extremity assist;From bed Stand to Sit: 4: Min guard;With upper extremity assist;To chair/3-in-1 Details for Transfer Assistance: Patient required verbal cues for safe hand placement to and from walker with sit<>stand. Repeated 3 x for practice. Ambulation/Gait Ambulation/Gait Assistance: 4: Min guard Ambulation Distance (Feet): 25 Feet Assistive device: Rolling walker Ambulation/Gait Assistance Details: Decreased speed and verbal cues required for flexed posture Gait Pattern: Step-through pattern;Decreased stride length;Trunk flexed    Exercises General Exercises - Lower Extremity Ankle Circles/Pumps: AROM;20 reps;Both;Seated Short Arc Quad: AROM;Both;10 reps;Seated (reclined) Heel Slides: AROM;Both;10 reps;Seated (reclined) Hip ABduction/ADduction: AROM;Both;10  reps;Seated (reclined)   PT Goals Acute Rehab PT Goals PT Goal: Supine/Side to Sit - Progress: Progressing toward goal PT Goal: Sit to Stand - Progress: Progressing toward goal PT Goal: Stand to Sit - Progress: Progressing toward goal PT Goal: Ambulate - Progress: Progressing toward goal  Visit Information  Last PT Received On: 12/13/11 Assistance Needed: +1    Subjective Data  Subjective: Patient reports feeling weak Patient Stated Goal: Improve strength   Cognition  Arousal/Alertness: Awake/alert Behavior During Session: [facility] for tasks performed    Balance  Static Sitting Balance Static Sitting - Balance Support: Feet supported;No upper extremity supported Static Sitting - Level of Assistance: 7: Independent  End of Session PT - End of Session Equipment Utilized During Treatment: Gait belt Activity Tolerance: Patient limited by fatigue Patient left: in chair;with call bell/phone within reach Nurse Communication: Mobility status (BP)    Edwyna Perfect, PT  Pager [phone]  12/13/2011, 9:15 AM

## 2011-12-13 NOTE — Consult Note (Addendum)
CARDIOLOGY CONSULT NOTE   Patient ID: HALIE GASS MRN: [id number] DOB/AGE: 76-Oct-1928 76 y.o.  Admit date: 12/08/2011  Primary Physician   Sanda Linger, MD, MD Primary Cardiologist   PN Reason for Consultation   Atrial fib  [id number] D Chavis is a 78 y.o. female with no history of CAD.   She was admitted on 4/20 with diarrhea, weakness, anemia, dehydration and acute renal failure (K+ 5/4, BUN 83, Cr 3.06). Pt condition improved initially (Cr decreased to 0.94) but she became hypotensive and SOB on 4/22 and there are some ST changes on telemetry strips but no ECG was done and she was in SR. On 4/24 at 8:24, she converted to atrial fibrillation at a rate of 174 bpm. She has been in atrial fib since then. Cardizem IV was used for rate control with some success and today she was changed to PO. She has tolerated without difficulty (despite listed allergic). Cardiology was asked to evaluate her. Ms. Chim remains in AF with a rate ~100 bpm and is currently without complaints. She denies chest pain, shortness of breath, palpitations, dizziness/near syncope or syncope.  Upon further questioning, Ms. Schiller reports undergoing a cardioversion after being found "out of rhythm" after hip replacement surgery in March/April 2012. Per Dr Kirke Corin note 11/30/2010 - her "Post operative course (from hip surgery) was complicated by atrial fibrillation with rapid ventricular response with significant lateral ST depression and hypotension. She did not respond to Cardizem and Amiodarone. Thus, she was urgently cardioverted to sinus rhythm. Amiodarone was continued. She was also found to have fluid overload and treated with Lasix. She was discharged to rehab but returned few days later with presyncope and was found to have junctional bradycardia and acute renal failure likely from over diuresis. Amiodarone was stopped. She was intubated for few days. Renal function improved to baseline with hydration".     Past Medical History  Diagnosis Date  . PAD (peripheral artery disease)     s/p bilateral comon iliac artery stenting in 2002. Known significant  R SFA  disease  . Aortic stenosis     Mild  . Diabetes mellitus   . Hypertension   . Hyperlipidemia   . Venous insufficiency   . CHF (congestive heart failure)     normal EF  . Atrial fib/flutter, transient March 2012  . Atrial fibrillation     post op  . DJD (degenerative joint disease) of hip     s/p R THR 10/2010     Past Surgical History  Procedure Date  . Total hip arthroplasty 1991, 1994    redo in 1994  . Lumbar disc surgery 1999  . Cardiac catheterization 01/10/2011    No significant CAD    Allergies  Allergen Reactions  . Carvedilol Other (See Comments)    Heart stops  . Diltiazem Hcl Other (See Comments)    Chest pain - pt has been on 11/2011 admission without problems.  . Fenofibrate   . Hctz (Hydrochlorothiazide) Other (See Comments)    Chest pain  . Nisoldipine Other (See Comments)    Chest pain  . Propranolol Diarrhea    Chest pain  . Tekturna (Aliskiren Fumarate) Other (See Comments)    Unknown reaction  . Valturna (Aliskiren-Valsartan) Other (See Comments)    Unknown reaction    I have reviewed the patient's current medications   . antiseptic oral rinse  15 mL Mouth Rinse BID  . cloNIDine  0.3 mg Transdermal Weekly  . diltiazem  90 mg Oral Q6H  . gabapentin  600 mg Oral QHS  . insulin aspart  0-5 Units Subcutaneous QHS  . insulin aspart  7 Units Subcutaneous TID PC  . insulin glargine  22 Units Subcutaneous QHS  . pantoprazole  40 mg Oral Q1200  . sodium phosphate  1 enema Rectal Once   . diltiazem (CARDIZEM) infusion 15 mg/hr (12/13/11 0829)    Medication Sig  . acetaminophen (TYLENOL) 325 MG tablet Take 325 mg by mouth once as needed. As needed for pain.  . Ascorbic Acid (VITAMIN C) 500 MG tablet Take 500 mg by mouth daily.    Marland Kitchen aspirin 81 MG tablet Take 2 at bedtime   . b complex  vitamins tablet Take 1 tablet by mouth daily.    . Cholecalciferol (VITAMIN D3) 2000 UNITS capsule Take 2,000 Units by mouth daily.    . Cinnamon 500 MG capsule 4 capsules daily   . cloNIDine (CATAPRES) 0.2 MG tablet Take 0.2 mg by mouth 4 (four) times daily.  . Coenzyme Q10 10 MG capsule (300mg  caps) 1 capsule by mouth daily   . diclofenac sodium (VOLTAREN) 1 % GEL Apply 1 application topically 4 (four) times daily as needed. pain  . fish oil-omega-3 fatty acids 1000 MG capsule Take 2 g by mouth daily.    . furosemide (LASIX) 40 MG tablet Take 40 mg by mouth 2 (two) times daily.  Marland Kitchen gabapentin (NEURONTIN) 300 MG capsule Take 600 mg by mouth at bedtime.  . Insulin Human (INSULIN PUMP) 100 unit/ml SOLN Inject into the skin continuous. Humalog Insulin- total 80units/day  . methocarbamol (ROBAXIN) 500 MG tablet Take 500 mg by mouth 3 (three) times daily as needed.    . metoprolol tartrate (LOPRESSOR) 25 MG tablet Take 50 mg by mouth 2 (two) times daily.  Marland Kitchen omeprazole (PRILOSEC) 40 MG capsule Take 40 mg by mouth daily.  . pravastatin (PRAVACHOL) 20 MG tablet Take 20 mg by mouth every evening.  . traMADol (ULTRAM) 50 MG tablet Take 50 mg by mouth every 6 (six) hours as needed. For pain  . valsartan (DIOVAN) 320 MG tablet Take 320 mg by mouth at bedtime.      History   Social History  . Marital Status: Widowed    Spouse Name: N/A    Number of Children: N/A  . Years of Education: N/A   Occupational History  .      retired   Social History Main Topics  . Smoking status: Never Smoker   . Smokeless tobacco: Never Used   Comment: Retired-widowed 89, lives with son  . Alcohol Use: No  . Drug Use: No  . Sexually Active: Not Currently   Other Topics Concern  . Not on file   Social History Narrative   Lives with sonWidowed 1991. Quit smoking in 1987. Has Walker at home.     Family History  Problem Relation Age of Onset  . Diabetes Father   . Diabetes Other     5/8 sibs     ROS:   Full 14 point review of systems complete and found to be negative unless listed above.  Physical Exam: Blood pressure 121/30, pulse 88, temperature 98.4 F (36.9 C), temperature source Oral, resp. rate 17, height 5\' 4"  (1.626 m), weight 145 lb 11.6 oz (66.1 kg), SpO2 92.00%.  General: Well developed, elderly appearing 78 year old female in no acute distress Head: Eyes PERRLA, No xanthomas.   Normocephalic and atraumatic, oropharynx without edema  or exudate.  Lungs: Clear to auscultation bilaterally. No wheezes, rales or rhonchi.  Heart: Irregularly irregular rate and rhythm with S1, S2 present. No murmurs, rub, S3 or S4. Neck: No carotid bruit. No JVD. Abdomen: Bowel sounds present, abdomen soft, non-distended and non-tender without masses or hernias noted. Msk:  No spine tenderness. Mild kyphosis. Extremities: No edema, clubbing or cyanosis. Pulses are 2+ all 4 extremities Neuro: Alert and oriented X 3. Resting tremor noted otherwise no focal deficits noted. Psych:  Good affect, responds appropriately Skin: No rashes or lesions noted.  Labs:   Lab Results  Component Value Date   WBC 10.6* 12/13/2011   HGB 9.3* 12/13/2011   HCT 29.8* 12/13/2011   MCV 94.3 12/13/2011   PLT 237 12/13/2011     Lab 12/13/11 0523 12/10/11 0405  NA 139 --  K 4.6 --  CL 102 --  CO2 26 --  BUN 53* --  CREATININE 2.09* --  CALCIUM 9.5 --  PROT -- 5.8*  BILITOT -- 0.3  ALKPHOS -- 81  ALT -- 9  AST -- 14  GLUCOSE 187* --   Magnesium  Date Value Range Status  12/10/2011 2.0  1.5-2.5 (mg/dL) Final   TSH  Date/Time Value Range Status  12/10/2011  4:05 AM 0.511  0.350-4.500 (uIU/mL) Final   Lab Results  Component Value Date   CKTOTAL 85 12/09/2011   CKMB 1.6 12/09/2011   TROPONINI <0.30 12/09/2011    Echo: 12/10/2011 Study Conclusions - Left ventricle: Wall thickness was increased in a pattern of moderate LVH. Systolic function was vigorous. The estimated ejection fraction was in the range of 65%  to 70%. - Aortic valve: Moderately calcified annulus. There was moderate stenosis. Valve area: 3.22cm^2(VTI). Valve area: 3.05cm^2 (Vmax). - Mitral valve: The findings are consistent with mild stenosis.  ECG:  12-Dec-2011 08:32:50  Atrial fibrillation with rapid ventricular response New since previous tracing Since last tracing rate faster Left axis deviation Septal infarct , age undetermined Marked ST abnormality, possible inferolateral subendocardial injury Abnormal ECG Incomplete left bundle branch block 60mm/s 88mm/mV 100Hz  8.0.1 12SL 239 CID: 1 Referred by: Confirmed By: Nanetta Batty MD Vent. rate 171 BPM PR interval * ms QRS duration 96 ms QT/QTc 262/441 ms P-R-T axes * -47 143  Radiology:  Dg Chest 2 View 12/09/2011  *RADIOLOGY REPORT*  Clinical Data: Repeated falls.  Anemia and dehydration.  CHEST - 2 VIEW  Comparison: 11/10/2010  Findings: Shallow inspiration with atelectasis or infiltration in the left lung base.  Bibasilar changes have improved since the previous study.  Normal heart size and pulmonary vascularity. Calcification of the aorta.  Degenerative changes in the spine.  No pneumothorax.  The no blunting of costophrenic angles.  IMPRESSION: Persistent but improving infiltration or atelectasis in the left lung base since previous study.  Interval resolution of right-sided atelectasis.  Original Report Authenticated By: Marlon Pel, M.D.   Ct Head Wo Contrast 12/10/2011  *RADIOLOGY REPORT*  Clinical Data: Lethargic and nausea and vomiting after fall.  CT HEAD WITHOUT CONTRAST  Technique:  Contiguous axial images were obtained from the base of the skull through the vertex without contrast.  Comparison: 11/04/2010  Findings: Mild cerebral atrophy.  Ventricles and sulci are otherwise symmetrical.  No mass effect or midline shift.  No abnormal extra-axial fluid collections.  No ventricular dilatation. Gray-white matter junctions are distinct.  Basal cisterns are not  effaced.  No evidence of acute intracranial hemorrhage.  No depressed skull fractures.  Opacification of  left maxillary antrum and ethmoid air cells is stable since previous study.  Vascular calcifications.  IMPRESSION: No acute intracranial abnormalities identified.  Original Report Authenticated By: Marlon Pel, M.D.   Ct Cervical Spine Wo Contrast 12/12/2011  *RADIOLOGY REPORT*  Clinical Data: Neck pain since a fall.  CT CERVICAL SPINE WITHOUT CONTRAST  Technique:  Multidetector CT imaging of the cervical spine was performed. Multiplanar CT image reconstructions were also generated.  Comparison: CT head 12/10/2011.  Findings: There is no visible cervical spine fracture or traumatic subluxation.  Slight facet mediated slip of 1 mm noted at C4-5, C5- 6, C6-7, and C7-T1.  Advanced facet arthropathy is noted at all levels, particularly C3-C4.  Lung apices are clear.  No rib abnormality.  Mild pannus.  Slight disc bulging with annular calcification noted C3-C7.  No visible spinal stenosis or intraspinal hematoma.  No prevertebral soft tissue swelling. Moderate carotid calcification, incompletely evaluated.  IMPRESSION: No visible cervical spine fracture or traumatic subluxation.  Original Report Authenticated By: Elsie Stain, M.D.   Dg Chest Port 1 View 12/12/2011  *RADIOLOGY REPORT*  Clinical Data: Pulmonary edema.  PORTABLE CHEST - 1 VIEW  Comparison: 12/10/2011.  Findings: Pulmonary vascular congestion are present.  Aortic arch atherosclerosis.  Interstitial pulmonary edema is present. Compared to prior, there is a substantial reduction in pulmonary edema and improving aeration at the lung bases.  Cardiopericardial silhouette upper limits of normal.  IMPRESSION: Improving pulmonary aeration with mild persistent basilar interstitial pulmonary edema.  Original Report Authenticated By: Andreas Newport, M.D.    ASSESSMENT AND PLAN:   The patient was seen today by Dr [name], the patient evaluated and the  data reviewed.  1. Atrial fibrillation  Ms. Rufener is now rate controlled on IV diltiazem. We agree with continuing rate control and transitioning her to PO diltiazem. We will continue to follow telemetry. She has history of bradycardia on beta blockers and digoxin is contraindicated in setting of renal failure. Aggressive management with cardioversion is not indicated or warranted at this time. Her CHADS2 score is 3 indicating a high risk for cardioembolic event/stroke at ~6% per year; however, she is not a candidate for anticoagulation at this time given her recent falls and current anemia of unknown etiology. This can be reconsidered as indicated in followup.   Rick Duff, MMSc, PA-C   Signed: Theodore Demark 12/13/2011, 1:28 PM Patient seen and examined. I agree with the assessment and plan as detailed above. See also my additional thoughts below.     *Hypotension   The patient initially had some hypotension when Cardizem was started for her atrial fib.. Blood pressure has now stabilized.   Atrial fib    Atrial fibrillation persists. The patient is stable. We know she's had some of this in the past. She has valvular heart disease with moderate aortic stenosis and mild mitral stenosis. It does not appear that she needs to be cardioverted for instability. Anticoagulation is difficult with her because she has a significant falling risk. The plan at this time will be to treat her with rate control and follow her. We'll also be helpful to know what her anemia is from.    Acute renal failure   Renal function is stabilizing. No change in therapy.    Dehydration  it was thought that there may have been some mild dehydration with admission.   Mitral stenosis   With some mitral valve disease this will make her atrial fib more persistent.   Aortic stenosis  Aortic stenosis is moderate. This can be watched.   CAD (coronary artery disease)  The plan for now will be rate control. We  need to treat all of her other problems. Later there could be reconsideration of the issue of anticoagulation. Right now she is a significant fall risk.    Willa Rough, MD, [facility] 12/13/2011 3:49 PM

## 2011-12-13 NOTE — Progress Notes (Signed)
CSW attempted to assess patient today, however pt on bedside  Commode. CSW will follow up later this afternoon.   .Clinical social worker continuing to follow pt to assist with pt dc plans and further csw needs.   Catha Gosselin, Theresia Majors  [phone] .12/13/2011 1455pm

## 2011-12-13 NOTE — Progress Notes (Signed)
RN responded to monitor alarm/BP 63/46 Right Arm. Patient sitting up in bed, alert, oriented x4 and conversing with niece. Asymptomatic. BP retaken in Left Arm 72/49. HR 104-Atrial Fib. Dr. Butler Denmark paged. Requested that RN  monitor patient closely, and if patient becomes symptomatic or remains hypotensive over the next hour, notify MD promptly.

## 2011-12-13 NOTE — Progress Notes (Signed)
Inpatient Diabetes Program Recommendations  AACE/ADA: New Consensus Statement on Inpatient Glycemic Control (2009)  Target Ranges:  Prepandial:   less than 140 mg/dL      Peak postprandial:   less than 180 mg/dL (1-2 hours)      Critically ill patients:  140 - 180 mg/dL   Patient needs Novolog correction scale TID order.    Inpatient Diabetes Program Recommendations Insulin - Basal: . Correction (SSI): Novolog SENSITIVE scale TID  Insulin - Meal Coverage: .  Thank you  Piedad Climes [facility] Inpatient Diabetes Coordinator [phone]

## 2011-12-14 LAB — GLUCOSE, CAPILLARY
Glucose-Capillary: 160 mg/dL — ABNORMAL HIGH (ref 70–99)
Glucose-Capillary: 207 mg/dL — ABNORMAL HIGH (ref 70–99)

## 2011-12-14 LAB — BASIC METABOLIC PANEL
Chloride: 101 mEq/L (ref 96–112)
GFR calc Af Amer: 22 mL/min — ABNORMAL LOW (ref >90)
Potassium: 4.9 mEq/L (ref 3.5–5.1)

## 2011-12-14 MED ORDER — DILTIAZEM HCL ER COATED BEADS 240 MG PO CP24
240.0000 mg | ORAL_CAPSULE | Freq: Every day | ORAL | Status: DC
  Administered 2011-12-14 – 2011-12-18 (×5): 240 mg via ORAL
  Filled 2011-12-14 (×5): qty 1

## 2011-12-14 MED ORDER — CLONIDINE HCL 0.1 MG PO TABS
0.1000 mg | ORAL_TABLET | Freq: Three times a day (TID) | ORAL | Status: DC
  Administered 2011-12-14 – 2011-12-16 (×7): 0.1 mg via ORAL
  Filled 2011-12-14 (×12): qty 1

## 2011-12-14 NOTE — Progress Notes (Signed)
Patient ID: Emma Yu, female   DOB: 13-Aug-1927, 78 y.o.   MRN: [id number]   SUBJECTIVE: Patient is stable. [name] not complaining of any chest pain or shortness of breath. Her atrial fibrillation is reasonably controlled.   Filed Vitals:   12/14/11 0324 12/14/11 0445 12/14/11 0555 12/14/11 0644  BP:  106/32  113/39  Pulse:  85    Temp: 98.4 F (36.9 C)     TempSrc: Oral     Resp:  16    Height:      Weight:   154 lb 15.7 oz (70.3 kg)   SpO2:  96%      Intake/Output Summary (Last 24 hours) at 12/14/11 0744 Last data filed at 12/14/11 0430  Gross per 24 hour  Intake 1419.58 ml  Output    450 ml  Net 969.58 ml    LABS: Basic Metabolic Panel:  Basename 12/14/11 0538 12/13/11 0523  NA 135 139  K 4.9 4.6  CL 101 102  CO2 25 26  GLUCOSE 108* 187*  BUN 63* 53*  CREATININE 2.26* 2.09*  CALCIUM 9.3 9.5  MG -- --  PHOS -- --   Liver Function Tests: No results found for this basename: AST:2,ALT:2,ALKPHOS:2,BILITOT:2,PROT:2,ALBUMIN:2 in the last 72 hours No results found for this basename: LIPASE:2,AMYLASE:2 in the last 72 hours CBC:  Basename 12/13/11 0523 12/12/11 0500  WBC 10.6* 13.9*  NEUTROABS -- --  HGB 9.3* 9.8*  HCT 29.8* 31.3*  MCV 94.3 95.7  PLT 237 235   Cardiac Enzymes: No results found for this basename: CKTOTAL:3,CKMB:3,CKMBINDEX:3,TROPONINI:3 in the last 72 hours BNP: No components found with this basename: POCBNP:3 D-Dimer: No results found for this basename: DDIMER:2 in the last 72 hours Hemoglobin A1C: No results found for this basename: HGBA1C in the last 72 hours Fasting Lipid Panel: No results found for this basename: CHOL,HDL,LDLCALC,TRIG,CHOLHDL,LDLDIRECT in the last 72 hours Thyroid Function Tests: No results found for this basename: TSH,T4TOTAL,FREET3,T3FREE,THYROIDAB in the last 72 hours  RADIOLOGY: Dg Chest 2 View  12/09/2011  *RADIOLOGY REPORT*  Clinical Data: Repeated falls.  Anemia and dehydration.  CHEST - 2 VIEW   Comparison: 11/10/2010  Findings: Shallow inspiration with atelectasis or infiltration in the left lung base.  Bibasilar changes have improved since the previous study.  Normal heart size and pulmonary vascularity. Calcification of the aorta.  Degenerative changes in the spine.  No pneumothorax.  The no blunting of costophrenic angles.  IMPRESSION: Persistent but improving infiltration or atelectasis in the left lung base since previous study.  Interval resolution of right-sided atelectasis.  Original Report Authenticated By: Marlon Pel, M.D.   Ct Head Wo Contrast  12/10/2011  *RADIOLOGY REPORT*  Clinical Data: Lethargic and nausea and vomiting after fall.  CT HEAD WITHOUT CONTRAST  Technique:  Contiguous axial images were obtained from the base of the skull through the vertex without contrast.  Comparison: 11/04/2010  Findings: Mild cerebral atrophy.  Ventricles and sulci are otherwise symmetrical.  No mass effect or midline shift.  No abnormal extra-axial fluid collections.  No ventricular dilatation. Gray-white matter junctions are distinct.  Basal cisterns are not effaced.  No evidence of acute intracranial hemorrhage.  No depressed skull fractures.  Opacification of left maxillary antrum and ethmoid air cells is stable since previous study.  Vascular calcifications.  IMPRESSION: No acute intracranial abnormalities identified.  Original Report Authenticated By: Marlon Pel, M.D.   Ct Cervical Spine Wo Contrast  12/12/2011  *RADIOLOGY REPORT*  Clinical Data: Neck  pain since a fall.  CT CERVICAL SPINE WITHOUT CONTRAST  Technique:  Multidetector CT imaging of the cervical spine was performed. Multiplanar CT image reconstructions were also generated.  Comparison: CT head 12/10/2011.  Findings: There is no visible cervical spine fracture or traumatic subluxation.  Slight facet mediated slip of 1 mm noted at C4-5, C5- 6, C6-7, and C7-T1.  Advanced facet arthropathy is noted at all levels,  particularly C3-C4.  Lung apices are clear.  No rib abnormality.  Mild pannus.  Slight disc bulging with annular calcification noted C3-C7.  No visible spinal stenosis or intraspinal hematoma.  No prevertebral soft tissue swelling. Moderate carotid calcification, incompletely evaluated.  IMPRESSION: No visible cervical spine fracture or traumatic subluxation.  Original Report Authenticated By: Elsie Stain, M.D.   Dg Chest Port 1 View  12/12/2011  *RADIOLOGY REPORT*  Clinical Data: Pulmonary edema.  PORTABLE CHEST - 1 VIEW  Comparison: 12/10/2011.  Findings: Pulmonary vascular congestion are present.  Aortic arch atherosclerosis.  Interstitial pulmonary edema is present. Compared to prior, there is a substantial reduction in pulmonary edema and improving aeration at the lung bases.  Cardiopericardial silhouette upper limits of normal.  IMPRESSION: Improving pulmonary aeration with mild persistent basilar interstitial pulmonary edema.  Original Report Authenticated By: Andreas Newport, M.D.   Dg Chest Port 1 View  12/10/2011  *RADIOLOGY REPORT*  Clinical Data: Shortness of breath  PORTABLE CHEST - 1 VIEW  Comparison: 12/10/2011  Findings: Stable mild cardiomegaly.  Increased small pleural effusions, peripheral Kerley B lines, and suggestion of early hazy perihilar opacity.  IMPRESSION: Finding suggest increasing edema.  Original Report Authenticated By: Harrel Lemon, M.D.   Dg Chest Port 1 View  12/10/2011  *RADIOLOGY REPORT*  Clinical Data: Aspiration pneumonitis  PORTABLE CHEST - 1 VIEW  Comparison: 12/09/2011  Findings: Progressive patchy airspace opacities in both lung bases and some worsening of perihilar interstitial opacities.  Suspect small bilateral pleural effusions.  Heart size upper limits normal. Atheromatous aortic arch.  IMPRESSION:  1.  Worsening perihilar and bibasilar infiltrates or edema with possible small effusions.  Original Report Authenticated By: Osa Craver, M.D.    Dg Abd Portable 1v  12/10/2011  *RADIOLOGY REPORT*  Clinical Data: Nausea, vomiting, ileus  PORTABLE ABDOMEN - 1 VIEW  Comparison: Previous day's chest  Findings: There is mild gaseous distention of the stomach.  Small bowel decompressed.  Moderate fecal material in the proximal colon, decompressed distally.  Right hip arthroplasty hardware partially seen.  Spondylitic changes in the lumbar spine.  IMPRESSION:  1.  Nonobstructive bowel gas pattern with moderate proximal colonic fecal material.  Original Report Authenticated By: Osa Craver, M.D.    PHYSICAL EXAM   Patient has no complaints this morning. Lungs reveal scattered rhonchi. Cardiac exam reveals S1 and S2. There is a soft systolic murmur. The rhythm is irregularly irregular. Abdomen is soft. There is no peripheral edema.   TELEMETRY: I have reviewed to telemetry. There is atrial fibrillation. The rate is controlled.   ASSESSMENT AND PLAN:   Mitral stenosis   Mild   Aortic stenosis    Moderate.   CAD (coronary artery disease)   Stable   Atrial fibrillation    The atrial fib rate appears to be controlled on the current dosing of diltiazem. Consideration could be given to changing it to a once a day dose. At this point there is no plan for cardioversion. There is no plan for long-term anticoagulation because her overall  mental status and falling propensity.   Willa Rough 12/14/2011 7:44 AM

## 2011-12-14 NOTE — Progress Notes (Signed)
Pt. No void  more than 8 hours, bladder not distended, denies any discomfort or urge to void, bladder scanned shows 322cc of urine. Encouraged pt. to void in the bedpan but unsuccessful. Leighton Parody made aware with orders made. Will cont. to monitor urine output.

## 2011-12-14 NOTE — Progress Notes (Addendum)
[facility] [place] TEAM 1 - Stepdown/ICU TEAM  PCP:  Sanda Linger, MD  Subjective: 78 y.o. female w/ medical history of PAD; Aortic stenosis; Diabetes mellitus; Hypertension; Hyperlipidemia; Venous insufficiency; CHF; Atrial fib/flutter; and DJD of hip who resented with a tremor resulting in repeated falls.  She fell Saturday AM out of the bed and hit her head resulting in laceration needing stiches. Was seen at [facility] where they put in a few stiches and discharged her home. She started to have some diarhea and was feeling week and was brought in to [facility] ER where she was found to be anemic and dehydrated with acute renal failure. Still not voiding much and needed another I and O cath early this AM which resulted in about 450cc of urine. Seh just now voided on her own- about 240 cc.    Objective: Weight change: 4.2 kg (9 lb 4.2 oz)  Intake/Output Summary (Last 24 hours) at 12/14/11 1330 Last data filed at 12/14/11 0900  Gross per 24 hour  Intake 1029.58 ml  Output    450 ml  Net 579.58 ml   Blood pressure 127/43, pulse 92, temperature 98.6 F (37 C), temperature source Oral, resp. rate 16, height 5\' 4"  (1.626 m), weight 70.3 kg (154 lb 15.7 oz), SpO2 94.00%.  CBG (last 3)   Basename 12/14/11 1159 12/13/11 2230 12/13/11 1911  GLUCAP 160* 147* 205*   Physical Exam: General: No acute respiratory distress at rest; calm, following commands, feeding self, essential tremor of arms and hands.  Lungs: lungs are clear to exam - no wheeze  Cardiovascular: irreg irreg with 2/6 holosystolic M w/o gallup or rub Abdomen: Nontender, nondistended, soft, bowel sounds positive, no rebound, no ascites, no appreciable mass Extremities: No significant cyanosis, clubbing, or edema bilateral lower extremities Neuro:  5/5 strength B U&LE -   Lab Results:  Basename 12/14/11 0538 12/13/11 0523 12/12/11 0500  NA 135 139 141  K 4.9 4.6 4.1  CL 101 102 104  CO2 25 26 25     GLUCOSE 108* 187* 196*  BUN 63* 53* 31*  CREATININE 2.26* 2.09* 1.12*  CALCIUM 9.3 9.5 9.7  MG -- -- --  PHOS -- -- --    Basename 12/13/11 0523 12/12/11 0500 12/11/11 1347  WBC 10.6* 13.9* 18.4*  NEUTROABS -- -- --  HGB 9.3* 9.8* 10.1*  HCT 29.8* 31.3* 31.8*  MCV 94.3 95.7 94.6  PLT 237 235 225   No results found for this basename: CKTOTAL:3,CKMB:3,CKMBINDEX:3,TROPONINI:3 in the last 72 hours Micro Results: Recent Results (from the past 240 hour(s))  URINE CULTURE     Status: Normal   Collection Time   12/09/11  5:56 AM      Component Value Range Status Comment   Specimen Description URINE, CLEAN CATCH   Final    Special Requests NONE   Final    Culture  Setup Time [id number]   Final    Colony Count >=100,000 COLONIES/ML   Final    Culture     Final    Value: ESCHERICHIA COLI     ENTEROCOCCUS SPECIES   Report Status 12/12/2011 FINAL   Final    Organism ID, Bacteria ESCHERICHIA COLI   Final    Organism ID, Bacteria ENTEROCOCCUS SPECIES   Final   MRSA PCR SCREENING     Status: Normal   Collection Time   12/09/11  8:42 AM      Component Value Range Status Comment   MRSA by  PCR NEGATIVE  NEGATIVE  Final     Studies/Results: All recent x-ray/radiology reports have been reviewed in detail.   Medications: I have reviewed the patient's complete medication list.  Assessment/Plan:  Afib w/ RVR cardizem gtt was initiated - given recent fall w/ head trauma and frequent falls, anticoag would carry too high a risk converedt to PO cardizem -  Will convert to long acting cardizem today. BP is low, therefore holding parameters have been added.   Volume status Pt fluctuating from overhydrating to quick dehydration;  Holding diuretics and IVF- poor po fluid intake, encourage fluids. May need to reduce dose of diuretics when discharging. Currently many of her meds on hold due to hypotension/ dehydration.   Pulmonary edema Resolved with diuresis - does not use O2 at home - ECHO  4/22 Mod LVH, EF 65-70%, mod AoS  hypotension Due to BP meds + volume depletion -  D/c hydralazine PO, holding Lasix and ARB, Clonidine patch removed, clonidine cut back to 0.1 TID with holding parameters.  Acute renal failure Renal function is fluctuating rapidly - d/c'd  ARB/ACE  - hold diuretic for now  BP was low yesterday contributing to elevated creatinine today.   Aortic stenosis- moderate Mentation is good despite low BPs.   Hyperkalemia Resolved w/ volume expansion/recovery of renal fxn  UTI urine cx reveals >100K E coli and enterococcus- sensitive to Rocephin - 4 days given - 4/22-4/25  Normocytic anemia Hgb appears to be stabilizing  dementia with mild confusion Improvement in restlessness, back to her baseline  PAD s/p bilateral comon iliac artery stenting in 2002 - known significant R SFA disease   DM Uses insulin pump at home but due to confusion her son would like the pump to be off and to take it home - is now on long acting Lantus and meal coverage Novolog   Calvert Cantor, MD [facility] Office  [phone] Pager [phone]  On-Call/Text Page:      [email or web address]      password [facility]

## 2011-12-14 NOTE — Progress Notes (Signed)
Clinical Social Work Department BRIEF PSYCHOSOCIAL ASSESSMENT 12/14/2011  Patient:  Emma Yu, Emma Yu     Account Number:  [id number]     Admit date:  12/08/2011  Clinical Social Worker:  Doree Albee  Date/Time:  12/14/2011 03:00 PM  Referred by:  RN  Date Referred:  12/13/2011 Referred for  SNF Placement  SNF Placement   Other Referral:   Interview type:  Patient Other interview type:    PSYCHOSOCIAL DATA Living Status:  FAMILY Admitted from facility:   Level of care:   Primary support name:  Skeet Simmer Primary support relationship to patient:  CHILD, ADULT Degree of support available:   strong, lives with pt    CURRENT CONCERNS Current Concerns  Post-Acute Placement   Other Concerns:    SOCIAL WORK ASSESSMENT / PLAN CSW met with pt at bedside to discuss patient current living environment and pt discharge plans. Pt shred that she lives with her son who helps around the house, but works most of the day.    Pt shared that she is interested in rehab at a snf in [facility] preferring [place]. Pt prefers [facility] but open to other facilities.    CSW discussed role and snf placement process. Pt thanked csw for cocnern and support.   Assessment/plan status:  Psychosocial Support/Ongoing Assessment of Needs Other assessment/ plan:   dc plans   Information/referral to community resources:   snf list    PATIENT'S/FAMILY'S RESPONSE TO PLAN OF CARE: Pt appreciatied csw concern and support. pt is eager to dc to snf for rehab when medically stable.  csw will complete fl2 for md signature and follow up with patient.       Catha Gosselin, Theresia Majors  [phone] .12/14/2011 1702pm

## 2011-12-14 NOTE — Progress Notes (Signed)
Patient's blood pressure on bedrest at 1915 was 83/49.  Upon examination, pt was asymptomatic with skin warm & dry; no c/o's dizziness or SOB.  Assisted up to bedside commode; upon return to bed patient c/o dizziness and "things going dark".  No loss of consciousness; no SOB; skin remained warm & dry.  BP = 87/52.  Patient gradually recovered with HOB left flat.  Repeat BP 5 minutes later = 103/48.  [facility] paged & M. Lynch made aware of transpired events.  Instructions received to proceed with transfer to telemetry bed.

## 2011-12-14 NOTE — Progress Notes (Signed)
Pt.  still no urge to void inspite of giving po fluids, bladder slightly distended, bladder scan done as ordered shows . of urine. In and out cath. done and obtained 450cc. of amber urine. Pt. tolerated the procedure well.

## 2011-12-14 NOTE — Progress Notes (Addendum)
Clinical Social Work Department CLINICAL SOCIAL WORK PLACEMENT NOTE 12/14/2011  Patient:  Emma Yu, Emma Yu  Account Number:  [id number] Admit date:  12/08/2011  Clinical Social Worker:  Doree Albee  Date/time:  12/14/2011 03:00 PM  Clinical Social Work is seeking post-discharge placement for this patient at the following level of care:   SKILLED NURSING   (*CSW will update this form in Epic as items are completed)   12/14/2011  Patient/family provided with [facility] Department of Clinical Social Work's list of facilities offering this level of care within the geographic area requested by the patient (or if unable, by the patient's family).  12/14/2011  Patient/family informed of their freedom to choose among providers that offer the needed level of care, that participate in Medicare, Medicaid or managed care program needed by the patient, have an available bed and are willing to accept the patient.  12/14/2011  Patient/family informed of [facility]' ownership interest in [facility], as well as of the fact that they are under no obligation to receive care at this facility.  PASARR submitted to EDS on 12/14/2011 PASARR number received from EDS on 12/14/2011  FL2 transmitted to all facilities in geographic area requested by pt/family on  12/14/2011 and 12/18/2011 to [facility] in [place] FL2 transmitted to all facilities within larger geographic area on   Patient informed that his/her managed care company has contracts with or will negotiate with  certain facilities, including the following:     Patient/family informed of bed offers received:  12/17/2011 SAB Patient chooses bed at  [facility] in [place] SAB Physician recommends and patient chooses bed at    Patient to be transferred to  on [facility] [place] on 12/18/2011 SAB Patient to be transferred to facility by pt son by car SAB  The following physician request were entered in  Epic:   Additional Comments:  Catha Gosselin, Theresia Majors  [phone] .12/14/2011 1702pm  Jacklynn Lewis, MSW, LCSWA  Clinical Social Work [phone] on 4/30

## 2011-12-15 DIAGNOSIS — E860 Dehydration: Secondary | ICD-10-CM

## 2011-12-15 LAB — GLUCOSE, CAPILLARY
Glucose-Capillary: 155 mg/dL — ABNORMAL HIGH (ref 70–99)
Glucose-Capillary: 109 mg/dL — ABNORMAL HIGH (ref 70–99)
Glucose-Capillary: 152 mg/dL — ABNORMAL HIGH (ref 70–99)

## 2011-12-15 LAB — CBC
HCT: 28.1 % — ABNORMAL LOW (ref 36.0–46.0)
Hemoglobin: 8.9 g/dL — ABNORMAL LOW (ref 12.0–15.0)
MCH: 29.8 pg (ref 26.0–34.0)
MCHC: 31.7 g/dL (ref 30.0–36.0)
MCV: 94 fL (ref 78.0–100.0)
Platelets: 220 K/uL (ref 150–400)
RBC: 2.99 MIL/uL — ABNORMAL LOW (ref 3.87–5.11)
RDW: 15.5 % (ref 11.5–15.5)
WBC: 6.6 K/uL (ref 4.0–10.5)

## 2011-12-15 LAB — BASIC METABOLIC PANEL
BUN: 55 mg/dL — ABNORMAL HIGH (ref 6–23)
Chloride: 106 mEq/L (ref 96–112)
Creatinine, Ser: 1.45 mg/dL — ABNORMAL HIGH (ref 0.50–1.10)
GFR calc Af Amer: 38 mL/min — ABNORMAL LOW (ref >90)
Glucose, Bld: 126 mg/dL — ABNORMAL HIGH (ref 70–99)

## 2011-12-15 NOTE — Progress Notes (Signed)
Patient ID: Emma Yu, female   DOB: 1927/07/13, 78 y.o.   MRN: [id number] Subjective:  No chest pain. C/o feeling dizzy and lightheaded with standing.  Objective:  Vital Signs in the last 24 hours: Temp:  [97.4 F (36.3 C)-98.8 F (37.1 C)] 98.7 F (37.1 C) (04/27 0600) Pulse Rate:  [82-107] 107  (04/27 0600) Resp:  [14-18] 18  (04/27 0600) BP: (87-137)/(44-62) 137/62 mmHg (04/27 0831) SpO2:  [93 %-98 %] 98 % (04/27 0600) Weight:  [151 lb 10.8 oz (68.8 kg)] 151 lb 10.8 oz (68.8 kg) (04/26 2110)  Intake/Output from previous day: 04/26 0701 - 04/27 0700 In: 720 [P.O.:720] Out: 850 [Urine:850] Intake/Output from this shift: Total I/O In: 240 [P.O.:240] Out: 300 [Urine:300]  Physical Exam: Well appearing NAD HEENT: Unremarkable Neck:  No JVD, no thyromegally Lungs:  Clear HEART:  IRegular rate rhythm, no murmurs, no rubs, no clicks Abd:  Flat, positive bowel sounds, no organomegally, no rebound, no guarding Ext:  2 plus pulses, no edema, no cyanosis, no clubbing Skin:  No rashes no nodules Neuro:  CN II through XII intact, motor grossly intact  Lab Results:  Basename 12/15/11 0525 12/13/11 0523  WBC 6.6 10.6*  HGB 8.9* 9.3*  PLT 220 237    Basename 12/15/11 0525 12/14/11 0538  NA 141 135  K 4.2 4.9  CL 106 101  CO2 27 25  GLUCOSE 126* 108*  BUN 55* 63*  CREATININE 1.45* 2.26*   No results found for this basename: TROPONINI:2,CK,MB:2 in the last 72 hours Hepatic Function Panel No results found for this basename: PROT,ALBUMIN,AST,ALT,ALKPHOS,BILITOT,BILIDIR,IBILI in the last 72 hours No results found for this basename: CHOL in the last 72 hours No results found for this basename: PROTIME in the last 72 hours  Imaging: No results found.  Cardiac Studies: Tele - atrial fib with a controlled VR Assessment/Plan:  1. Atrial fib with a CVR 2.MS/AS -  3. Diastolic CHF 4. Recent diarrhea Rec: etiology of her symptoms seems most likely related to atrial  fib with an RVR and relative volume depletion. Both improved. Would check orthostatic vitals and ambulate.   LOS: 7 days    Lewayne Bunting 12/15/2011, 1:20 PM

## 2011-12-15 NOTE — Progress Notes (Signed)
[facility] [place] TEAM 1 - Stepdown/ICU TEAM  PCP:  Sanda Linger, MD  Interim History 78 y.o. female w/ medical history of PAD; Aortic stenosis; Diabetes mellitus; Hypertension; Hyperlipidemia; Venous insufficiency; CHF; Atrial fib/flutter; and DJD of hip and repeated falls.  She fell Saturday AM out of the bed and hit her head resulting in laceration needing stiches. Was seen at [facility] where they put in a few stiches and discharged her home. She started to have some diarhea and was feeling week and was brought in to [facility] ER where she was found to be anemic and dehydrated with acute renal failure.  Subjective: Very alert and oriented today. Recognizes me. States she is eating poorly and feeling weak when she gets out of bed. [name] aware that we are looking for a nursing facility for her.    Objective: Weight change: -1.5 kg (-3 lb 4.9 oz)  Intake/Output Summary (Last 24 hours) at 12/15/11 1758 Last data filed at 12/15/11 1400  Gross per 24 hour  Intake    540 ml  Output   1250 ml  Net   -710 ml   Blood pressure 146/61, pulse 96, temperature 98 F (36.7 C), temperature source Oral, resp. rate 20, height 5\' 4"  (1.626 m), weight 68.8 kg (151 lb 10.8 oz), SpO2 93.00%.  CBG (last 3)   Basename 12/15/11 1650 12/15/11 1140 12/15/11 0840  GLUCAP 109* 155* 136*   Physical Exam: General: Laying in bed, calm, no distress Lungs: lungs are clear to exam - no wheeze  Cardiovascular: irreg irreg with 2/6 holosystolic M w/o gallup or rub Abdomen: Nontender, nondistended, soft, bowel sounds positive, no rebound, no ascites, no appreciable mass Extremities: No significant cyanosis, clubbing, or edema bilateral lower extremities Neuro:  5/5 strength B U&LE -   Lab Results:  Basename 12/15/11 0525 12/14/11 0538 12/13/11 0523  NA 141 135 139  K 4.2 4.9 4.6  CL 106 101 102  CO2 27 25 26   GLUCOSE 126* 108* 187*  BUN 55* 63* 53*  CREATININE 1.45* 2.26* 2.09*    CALCIUM 9.8 9.3 9.5  MG -- -- --  PHOS -- -- --    Basename 12/15/11 0525 12/13/11 0523  WBC 6.6 10.6*  NEUTROABS -- --  HGB 8.9* 9.3*  HCT 28.1* 29.8*  MCV 94.0 94.3  PLT 220 237   No results found for this basename: CKTOTAL:3,CKMB:3,CKMBINDEX:3,TROPONINI:3 in the last 72 hours Micro Results: Recent Results (from the past 240 hour(s))  URINE CULTURE     Status: Normal   Collection Time   12/09/11  5:56 AM      Component Value Range Status Comment   Specimen Description URINE, CLEAN CATCH   Final    Special Requests NONE   Final    Culture  Setup Time [id number]   Final    Colony Count >=100,000 COLONIES/ML   Final    Culture     Final    Value: ESCHERICHIA COLI     ENTEROCOCCUS SPECIES   Report Status 12/12/2011 FINAL   Final    Organism ID, Bacteria ESCHERICHIA COLI   Final    Organism ID, Bacteria ENTEROCOCCUS SPECIES   Final   MRSA PCR SCREENING     Status: Normal   Collection Time   12/09/11  8:42 AM      Component Value Range Status Comment   MRSA by PCR NEGATIVE  NEGATIVE  Final     Studies/Results: All recent x-ray/radiology reports have  been reviewed in detail.   Medications: I have reviewed the patient's complete medication list.  Assessment/Plan:  Afib w/ RVR cardizem gtt was initiated -  converedt to PO cardizem -  Will convert to long acting cardizem today. BP is low, therefore holding parameters have been added.  given recent fall w/ head trauma and frequent falls, anticoag would carry too high a risk  Volume status Pt fluctuating from dehydration to being overhydrated (and requiring stepdown) to quick dehydration again  Holding diuretics and IVF- poor po fluid intake, encourage fluids. May need to reduce dose of diuretics when discharging. Currently many of her meds on hold due to hypotension/ dehydration.   Pulmonary edema Resolved with diuresis - does not use O2 at home - ECHO 4/22 Mod LVH, EF 65-70%, mod AoS  hypotension Due to BP meds +  volume depletion - BP dropped to 80's systolic. D/c'd hydralazine PO, holding Lasix and ARB, Clonidine patch removed, clonidine cut back to 0.1 TID with holding parameters. BP beginning to come back up today  Acute renal failure Present on admission; recurred after diuresing for pulm edema  Aortic stenosis- moderate Mentation was good despite low BPs.   Hyperkalemia Resolved w/ volume expansion/recovery of renal fxn  UTI urine cx reveals >100K E coli and enterococcus- sensitive to Rocephin - 4 days given - 4/22-4/25  Normocytic anemia Hgb appears to be stabilizing  dementia with mild confusion  back to her baseline  PAD s/p bilateral comon iliac artery stenting in 2002 - known significant R SFA disease   DM Uses insulin pump at home but due to confusion her son would like the pump to be off and to take it home - is now on long acting Lantus and meal coverage Novolog Due to resolution of confusion and rising sugars, I have mentioned to pt that she can resume her insulin pump   Calvert Cantor, MD [facility] Office  [phone] Pager [phone]  On-Call/Text Page:      [email or web address]      password [facility]

## 2011-12-16 LAB — GLUCOSE, CAPILLARY
Glucose-Capillary: 173 mg/dL — ABNORMAL HIGH (ref 70–99)
Glucose-Capillary: 202 mg/dL — ABNORMAL HIGH (ref 70–99)
Glucose-Capillary: 159 mg/dL — ABNORMAL HIGH (ref 70–99)
Glucose-Capillary: 130 mg/dL — ABNORMAL HIGH (ref 70–99)

## 2011-12-16 MED ORDER — METOPROLOL TARTRATE 12.5 MG HALF TABLET
12.5000 mg | ORAL_TABLET | Freq: Two times a day (BID) | ORAL | Status: DC
  Administered 2011-12-16 (×2): 12.5 mg via ORAL
  Filled 2011-12-16 (×4): qty 1

## 2011-12-16 MED ORDER — ASPIRIN 81 MG PO CHEW
162.0000 mg | CHEWABLE_TABLET | Freq: Every day | ORAL | Status: DC
  Administered 2011-12-16 – 2011-12-18 (×3): 162 mg via ORAL
  Filled 2011-12-16 (×3): qty 2

## 2011-12-16 NOTE — Progress Notes (Signed)
Subjective: No new issues. Sat out in chair this AM.  Objective: Vital signs in last 24 hours: Temp:  [98 F (36.7 C)-98.4 F (36.9 C)] 98.4 F (36.9 C) (04/27 2100) Pulse Rate:  [81-110] 110  (04/28 0800) Resp:  [14-20] 14  (04/27 2100) BP: (104-163)/(41-61) 140/45 mmHg (04/28 0800) SpO2:  [93 %] 93 % (04/27 2100) Weight change:  Last BM Date: 12/15/11  Intake/Output from previous day: 04/27 0701 - 04/28 0700 In: 420 [P.O.:420] Out: 1000 [Urine:1000]     Physical Exam: General: Comfortable, alert, communicative, fully oriented, not short of breath at rest.  HEENT:  Moderate clinical pallor, no jaundice, no conjunctival injection or discharge. Hydration status appears fair. Scalp laceration on vertex, appears to be healing well, and sutures are still in situ. NECK:  Supple, JVP not seen, no carotid bruits, no palpable lymphadenopathy, no palpable goiter. CHEST:  Clinically clear to auscultation, no wheezes, no crackles. HEART:  Sounds 1 and 2 heard, normal, regular. Patient has systolic murmur, radiating to both carotids. ABDOMEN:  Full, soft, non-tender, no palpable organomegaly, no palpable masses, normal bowel sounds. GENITALIA:  Not examined. LOWER EXTREMITIES:  No pitting edema, palpable peripheral pulses. MUSCULOSKELETAL SYSTEM:  Generalized osteoarthritic changes, otherwise, normal. CENTRAL NERVOUS SYSTEM:  No focal neurologic deficit on gross examination.  Lab Results:  [facility] 12/15/11 0525  WBC 6.6  HGB 8.9*  HCT 28.1*  PLT 220    Basename 12/15/11 0525 12/14/11 0538  NA 141 135  K 4.2 4.9  CL 106 101  CO2 27 25  GLUCOSE 126* 108*  BUN 55* 63*  CREATININE 1.45* 2.26*  CALCIUM 9.8 9.3   Recent Results (from the past 240 hour(s))  URINE CULTURE     Status: Normal   Collection Time   12/09/11  5:56 AM      Component Value Range Status Comment   Specimen Description URINE, CLEAN CATCH   Final    Special Requests NONE   Final    Culture  Setup Time  [id number]   Final    Colony Count >=100,000 COLONIES/ML   Final    Culture     Final    Value: ESCHERICHIA COLI     ENTEROCOCCUS SPECIES   Report Status 12/12/2011 FINAL   Final    Organism ID, Bacteria ESCHERICHIA COLI   Final    Organism ID, Bacteria ENTEROCOCCUS SPECIES   Final   MRSA PCR SCREENING     Status: Normal   Collection Time   12/09/11  8:42 AM      Component Value Range Status Comment   MRSA by PCR NEGATIVE  NEGATIVE  Final      Studies/Results: No results found.  Medications: Scheduled Meds:   . antiseptic oral rinse  15 mL Mouth Rinse BID  . cloNIDine  0.1 mg Oral TID  . diltiazem  240 mg Oral Daily  . gabapentin  600 mg Oral QHS  . insulin aspart  0-5 Units Subcutaneous QHS  . insulin aspart  7 Units Subcutaneous TID PC  . insulin glargine  22 Units Subcutaneous QHS  . pantoprazole  40 mg Oral Q1200  . sodium phosphate  1 enema Rectal Once   Continuous Infusions:   . sodium chloride Stopped (12/13/11 1547)   PRN Meds:.acetaminophen, acetaminophen, alum & mag hydroxide-simeth, guaiFENesin-dextromethorphan, hydrALAZINE, ondansetron (ZOFRAN) IV, ondansetron, oxyCODONE, traMADol  Assessment/Plan: 1. Falls/Head trauma:  Patient has had recurrent mechanical falls, deemed multifactorial, secondary to frailty, tremor and gait instability. She sustained  a fall on 12/08/11, complicated by a scalp laceration, addressed at [facility] with stiches. Fortunately, there were no intracranial injuries or bony injuries. The laceration is healing well, and we should be able to remove sutures on 12/17/11 or 12/18/11. 2. Afib w/ RVR:   Patient has a known history of Atrial Flutter/fibrillation, but was in SR at presentation. On 11/2411, at 8:24, she converted to atrial fibrillation at a rate of 174 bpm and has remained in atrial fibrillation since then. Cardizem IV was used for rate control with success and on 12/13/10, she was transitioned to oral Cardizem. Cardiology  consultation was provided by Dr Willa Rough. Fortunately, patient had no evidence of ACS. At the present time, HR is reasonable, at 88-110. Given recent fall with head trauma and frequent falls, anticoag would carry too high a risk. [name] therefore, on Aspirin. 3. Pulmonary edema/Aortic stenosis- moderate:  Patient was dehydrated, volume-depleted and hypotensive at the time of presentation, and was successfully managed with iv fluids, However, she unfortunately, went into pulmonary edema, due to overhydration, against a background of fast atrial fibrillation, and known moderate aortic stenosis. This has resolved with diuresis. Echocardiogram of 12/10/11, showed moderate LVH, EF 65-70%, and moderate aortic stenosis.  4. Dehydration/hypotension:  Patient was hypotensive with systolic BP in the 80s on presentation, due to dehydration/volume depletion secondary to diarrhea, against a background of poor oral intake, continuing antihypertensive medication and diuretic therapy. Culprit medications were held, including Hydralazine, Lasix, Clonidine patch and ARB. As of 12/16/11, BP is fairly reasonable at 140/45. 5. Acute renal failure  On admission, patient was in ARF, with creatinine of 3.06 and BUN of 83. This was of course, multifactorial, due to dehydration, volume depletion, hypotension and nephrotoxins. Renal status has improved during this hospitalization, but volume status has proven rather problematic, with patient fluctuating from dehydration to being overhydrated to quick dehydration on diuretics. We are currently holding diuretics and IVF. Oral intake is poor po fluid intake. Encouraging oral fluids.  Creatinine was 1.45 on 12/16/11, which is probably close to baseline. 6. UTI: This is polymicrobial. Urine culture revealed >100K E coli and enterococcus, both sensitive to Rocephin. Patient has concluded therapy, from 12/10/11-12/13/11. She has remained apyrexial, and wcc is normal at 6.6 as of 12/16/11.  7.  Normocytic anemia:  Patient did have a normocytic anemia, with hemoglobin of 9.2, at the time of presentation, likely due to acute blood loss on chronic anemia. Likely, there was some hemoconcentration at that time, so the true value was lower. Hemoglobin has remained stable during this hospitalization,and was 8.9 on 12/16/11. 8. Altered mental Status:  Patient was confused at presentation. Likely, this was toxic-metabolic, due to her acute medical issues. Mental status is now at baseline.  9. DM:  Pre-admission, patient was on insulin pump, and has been under the care of Dr Romero Belling, with good control.  [name] currently being managed with diet, Lantus, SSI and meal coverage with Novolog, and CBGs are reasonable.   Comment: Patient appear to be nearing discharge. [name] deconditioned, and will benefit from SNF rehab. Aiming discharge in next couple of days.   Note: Will check carotid doppler, although I feel the murmurs heard are conducted from aortic valve, rather than due to carotid bruits.   LOS: 8 days   Nainoa Woldt,CHRISTOPHER 12/16/2011, 9:44 AM

## 2011-12-16 NOTE — Progress Notes (Signed)
I agree with the assessments and medication admin done by Megan Roberts [employer] student from 7p-7a. 

## 2011-12-16 NOTE — Progress Notes (Signed)
Pt's son states that pt refuses to go via ambulance to SNF upon d/c.  Son states that they prefer he drives her to the SNF.  SW on call was notified about this as well.  SW agrees that this is fine and there should not be a problem with the patient's preference.

## 2011-12-16 NOTE — Progress Notes (Signed)
SUBJECTIVE: Stable, denies palps, SOB, c/o being sleepy at times.   Filed Vitals:   12/15/11 1808 12/15/11 1958 12/15/11 2100 12/16/11 0800  BP: 124/58 149/58 163/53 140/45  Pulse: 101  88 110  Temp:   98.4 F (36.9 C)   TempSrc:      Resp:   14   Height:      Weight:      SpO2:   93%     Intake/Output Summary (Last 24 hours) at 12/16/11 0840 Last data filed at 12/15/11 1945  Gross per 24 hour  Intake    420 ml  Output   1000 ml  Net   -580 ml    TELEMETRY: Reviewed telemetry pt in AF 90-110s, this AM  LABS: Basic Metabolic Panel:  Basename 12/15/11 0525 12/14/11 0538  NA 141 135  K 4.2 4.9  CL 106 101  CO2 27 25  GLUCOSE 126* 108*  BUN 55* 63*  CREATININE 1.45* 2.26*  CALCIUM 9.8 9.3  MG -- --  PHOS -- --   Liver Function Tests: No results found for this basename: AST:2,ALT:2,ALKPHOS:2,BILITOT:2,PROT:2,ALBUMIN:2 in the last 72 hours No results found for this basename: LIPASE:2,AMYLASE:2 in the last 72 hours CBC:  Basename 12/15/11 0525  WBC 6.6  NEUTROABS --  HGB 8.9*  HCT 28.1*  MCV 94.0  PLT 220   Cardiac Enzymes: No results found for this basename: CKTOTAL:3,CKMB:3,CKMBINDEX:3,TROPONINI:3 in the last 72 hours BNP: No components found with this basename: POCBNP:3 D-Dimer: No results found for this basename: DDIMER:2 in the last 72 hours Hemoglobin A1C: No results found for this basename: HGBA1C in the last 72 hours Fasting Lipid Panel: No results found for this basename: CHOL,HDL,LDLCALC,TRIG,CHOLHDL,LDLDIRECT in the last 72 hours Thyroid Function Tests: No results found for this basename: TSH,T4TOTAL,FREET3,T3FREE,THYROIDAB in the last 72 hours Anemia Panel: No results found for this basename: VITAMINB12,FOLATE,FERRITIN,TIBC,IRON,RETICCTPCT in the last 72 hours  RADIOLOGY: Dg Chest Port 1 View  12/12/2011  *RADIOLOGY REPORT*  Clinical Data: Pulmonary edema.  PORTABLE CHEST - 1 VIEW  Comparison: 12/10/2011.  Findings: Pulmonary vascular  congestion are present.  Aortic arch atherosclerosis.  Interstitial pulmonary edema is present. Compared to prior, there is a substantial reduction in pulmonary edema and improving aeration at the lung bases.  Cardiopericardial silhouette upper limits of normal.  IMPRESSION: Improving pulmonary aeration with mild persistent basilar interstitial pulmonary edema.  Original Report Authenticated By: Andreas Newport, M.D.   PHYSICAL EXAM: GENERAL: well-nourished, well-developed; NAD HEENT: NCAT, PERRLA, EOMI; sclera clear; no xanthelasma NECK: bilateral carotid bruits; no JVD; no TM LUNGS: faint basilar crackles CARDIAC: Irreg fhythm, increased rate (S1, S2); II/VI sys murmur at base ABDOMEN: soft, non-tender; intact BS EXTREMETIES: intact distal pulses; no significant peripheral edema SKIN: warm/dry; no obvious rash/lesions MUSCULOSKELETAL: no joint deformity NEURO: no focal deficit; NL affect  ASSESSMENT: 1  Atrial fib with a CVR   - on ASA 162 PTA  - Cardizem 240 daily 2  MS/AS - medical therapy.  3  Diastolic CHF secondary to 1 and 2. 4  Carotid bruits, bilateral  5  S/p Fall, no LOC  PLAN: Pt on Lopressor 25 bid PTA, consider resuming for added rate control, if BP remains stable. Resume ASA 162 daily, prior to DC. Rec outpt eval for carotid bruits.   Gene Serpe  Cardiology Attending  Patient seen and independently examined and interviewed. I agree with the findings above as amended. Continue rate control. I agree with plan to discharge tomorrow.  Lewayne Bunting, M.D.

## 2011-12-17 DIAGNOSIS — R0989 Other specified symptoms and signs involving the circulatory and respiratory systems: Secondary | ICD-10-CM

## 2011-12-17 DIAGNOSIS — I359 Nonrheumatic aortic valve disorder, unspecified: Secondary | ICD-10-CM

## 2011-12-17 LAB — BASIC METABOLIC PANEL
BUN: 26 mg/dL — ABNORMAL HIGH (ref 6–23)
Chloride: 108 mEq/L (ref 96–112)
GFR calc Af Amer: 61 mL/min — ABNORMAL LOW (ref >90)
GFR calc non Af Amer: 53 mL/min — ABNORMAL LOW (ref >90)
Potassium: 4.4 mEq/L (ref 3.5–5.1)
Sodium: 142 mEq/L (ref 135–145)

## 2011-12-17 LAB — CBC
HCT: 28.7 % — ABNORMAL LOW (ref 36.0–46.0)
Platelets: 225 10*3/uL (ref 150–400)
RDW: 15.5 % (ref 11.5–15.5)
WBC: 6.9 10*3/uL (ref 4.0–10.5)

## 2011-12-17 LAB — GLUCOSE, CAPILLARY
Glucose-Capillary: 135 mg/dL — ABNORMAL HIGH (ref 70–99)
Glucose-Capillary: 139 mg/dL — ABNORMAL HIGH (ref 70–99)

## 2011-12-17 MED ORDER — METOPROLOL TARTRATE 25 MG PO TABS
25.0000 mg | ORAL_TABLET | Freq: Two times a day (BID) | ORAL | Status: DC
  Administered 2011-12-17 – 2011-12-18 (×2): 25 mg via ORAL
  Filled 2011-12-17 (×4): qty 1

## 2011-12-17 MED ORDER — CLONIDINE HCL 0.1 MG PO TABS
0.1000 mg | ORAL_TABLET | Freq: Two times a day (BID) | ORAL | Status: DC
  Administered 2011-12-18: 0.1 mg via ORAL
  Filled 2011-12-17 (×3): qty 1

## 2011-12-17 NOTE — Progress Notes (Signed)
Occupational Therapy Treatment Patient Details Name: Emma Yu MRN: [id number] DOB: 1926/08/26 Today's Date: 12/17/2011 Time: 1191-4782 OT Time Calculation (min): 8 min  OT Assessment / Plan / Recommendation Comments on Treatment Session Pt. progressing very well and supervision with mobility and pt. will benefit from ST-SNF to increase overall activity tolerance and safety    Follow Up Recommendations  Skilled nursing facility    Equipment Recommendations  Defer to next venue    Frequency Min 2X/week   Plan Discharge plan remains appropriate    Precautions / Restrictions Precautions Precautions: Fall Restrictions Weight Bearing Restrictions: No   Pertinent Vitals/Pain No pain    ADL  Grooming: Performed;Wash/dry hands;Wash/dry face;Brushing hair;Set up;Supervision/safety Where Assessed - Grooming: Standing at sink Toilet Transfer: Simulated;Supervision/safety Toilet Transfer Method: Proofreader: Other (comment) (recliner) Ambulation Related to ADLs: Pt. min guard ~30' with RW  ADL Comments: Pt. educated on safety with use of RW during functional mobility. Pt. session ended due to tech present for procedure for pt.     OT Goals Acute Rehab OT Goals OT Goal Formulation: With patient Time For Goal Achievement: 12/25/11 Potential to Achieve Goals: Fair ADL Goals Pt Will Perform Grooming: with set-up;Sitting, chair;Unsupported ADL Goal: Grooming - Progress: Met Pt Will Transfer to Toilet: with min assist;Comfort height toilet ADL Goal: Toilet Transfer - Progress: Met  Visit Information  Last OT Received On: 12/17/11 Assistance Needed: +1          Cognition  Overall Cognitive Status: Appears within functional limits for tasks assessed/performed Arousal/Alertness: Awake/alert Orientation Level: Oriented X4 / Intact Behavior During Session: [facility] for tasks performed    Mobility Bed Mobility Bed Mobility: Sit to Supine Sit to Supine:  5: Supervision Transfers Transfers: Sit to Stand;Stand to Sit Sit to Stand: 4: Min guard;With upper extremity assist;From bed Stand to Sit: 4: Min guard;With upper extremity assist;To chair/3-in-1 Details for Transfer Assistance: MIn verbal cues for safe hand placement and technique         End of Session OT - End of Session Equipment Utilized During Treatment: Gait belt Activity Tolerance: Patient tolerated treatment well Patient left: in bed;Other (comment) (tech present for procedure) Nurse Communication: Mobility status   Celene Pippins, OTR/L Pager [phone] 12/17/2011, 9:58 AM

## 2011-12-17 NOTE — Progress Notes (Signed)
Subjective: Asymptomatic.  Objective: Vital signs in last 24 hours: Temp:  [97.3 F (36.3 C)-99.2 F (37.3 C)] 99 F (37.2 C) (04/29 1317) Pulse Rate:  [68-116] 68  (04/29 1317) Resp:  [14-18] 16  (04/29 1317) BP: (113-146)/(43-66) 125/43 mmHg (04/29 1317) SpO2:  [93 %-95 %] 93 % (04/29 1317) Weight change:  Last BM Date: 12/16/11  Intake/Output from previous day: 04/28 0701 - 04/29 0700 In: 360 [P.O.:360] Out: 300 [Urine:300] Total I/O In: 600 [P.O.:600] Out: 2 [Urine:2]   Physical Exam: General: Comfortable, alert, communicative, fully oriented, not short of breath at rest.  HEENT:  Moderate clinical pallor, no jaundice, no conjunctival injection or discharge. Hydration status appears fair. Scalp laceration on vertex, appears to be healing well, and sutures are still in situ. NECK:  Supple, JVP not seen, no carotid bruits, no palpable lymphadenopathy, no palpable goiter. CHEST:  Clinically clear to auscultation, no wheezes, no crackles. HEART:  Sounds 1 and 2 heard, normal, regular. Patient has systolic murmur, radiating to both carotids. ABDOMEN:  Full, soft, non-tender, no palpable organomegaly, no palpable masses, normal bowel sounds. GENITALIA:  Not examined. LOWER EXTREMITIES:  No pitting edema, palpable peripheral pulses. MUSCULOSKELETAL SYSTEM:  Generalized osteoarthritic changes, otherwise, normal. CENTRAL NERVOUS SYSTEM:  No focal neurologic deficit on gross examination.  Lab Results:  [facility] 12/17/11 0707 12/15/11 0525  WBC 6.9 6.6  HGB 9.1* 8.9*  HCT 28.7* 28.1*  PLT 225 220    Basename 12/17/11 0707 12/15/11 0525  NA 142 141  K 4.4 4.2  CL 108 106  CO2 26 27  GLUCOSE 130* 126*  BUN 26* 55*  CREATININE 0.97 1.45*  CALCIUM 10.0 9.8   Recent Results (from the past 240 hour(s))  URINE CULTURE     Status: Normal   Collection Time   12/09/11  5:56 AM      Component Value Range Status Comment   Specimen Description URINE, CLEAN CATCH   Final    Special Requests NONE   Final    Culture  Setup Time [id number]   Final    Colony Count >=100,000 COLONIES/ML   Final    Culture     Final    Value: ESCHERICHIA COLI     ENTEROCOCCUS SPECIES   Report Status 12/12/2011 FINAL   Final    Organism ID, Bacteria ESCHERICHIA COLI   Final    Organism ID, Bacteria ENTEROCOCCUS SPECIES   Final   MRSA PCR SCREENING     Status: Normal   Collection Time   12/09/11  8:42 AM      Component Value Range Status Comment   MRSA by PCR NEGATIVE  NEGATIVE  Final      Studies/Results: No results found.  Medications: Scheduled Meds:    . aspirin  162 mg Oral Daily  . cloNIDine  0.1 mg Oral BID  . diltiazem  240 mg Oral Daily  . gabapentin  600 mg Oral QHS  . insulin aspart  0-5 Units Subcutaneous QHS  . insulin aspart  7 Units Subcutaneous TID PC  . insulin glargine  22 Units Subcutaneous QHS  . metoprolol tartrate  25 mg Oral BID  . pantoprazole  40 mg Oral Q1200  . sodium phosphate  1 enema Rectal Once  . DISCONTD: antiseptic oral rinse  15 mL Mouth Rinse BID  . DISCONTD: cloNIDine  0.1 mg Oral TID  . DISCONTD: metoprolol tartrate  12.5 mg Oral BID   Continuous Infusions:    . sodium chloride  Stopped (12/13/11 1547)   PRN Meds:.acetaminophen, acetaminophen, alum & mag hydroxide-simeth, guaiFENesin-dextromethorphan, hydrALAZINE, ondansetron (ZOFRAN) IV, ondansetron, oxyCODONE, traMADol  Assessment/Plan: 1. Falls/Head trauma:  Patient has had recurrent mechanical falls, deemed multifactorial, secondary to frailty, tremor and gait instability. She sustained a fall on 12/08/11, complicated by a scalp laceration, addressed at [facility] with stiches. Fortunately, there were no intracranial injuries or bony injuries. The laceration is healing well, and sutures will be removed today. 2. Afib w/ RVR:   Patient has a known history of Atrial Flutter/fibrillation, but was in SR at presentation. On 11/2411, at 8:24, she converted to atrial  fibrillation at a rate of 174 bpm and has remained in atrial fibrillation since then. Cardizem IV was used for rate control with success and on 12/13/10, she was transitioned to oral Cardizem. Cardiology consultation was provided by Dr Willa Rough. Fortunately, patient had no evidence of ACS. Beta-blocker was commenced on 12/16/11, and HR is now controlled at 68-88/min. Given recent fall with head trauma and frequent falls, anticoag would carry too high a risk. [name] therefore, on Aspirin. 3. Pulmonary edema/Aortic stenosis- moderate:  Patient was dehydrated, volume-depleted and hypotensive at the time of presentation, and was successfully managed with iv fluids, However, she unfortunately, went into pulmonary edema, due to overhydration, against a background of fast atrial fibrillation, and known moderate aortic stenosis. This has resolved with diuresis. Echocardiogram of 12/10/11, showed moderate LVH, EF 65-70%, and moderate aortic stenosis. Because of bilateral carotid bruits, Carotid ultrasound was done on 12/17/11, showed 60-79% right ICA stenosis and 50-59% left ICA stenosis. We shall discuss with Dr Imogene Burn, vascular surgeon, for recommendations. 4. Dehydration/hypotension:  Patient was hypotensive with systolic BP in the 80s on presentation, due to dehydration/volume depletion secondary to diarrhea, against a background of poor oral intake, continuing antihypertensive medication and diuretic therapy. Culprit medications were held, including Hydralazine, Lasix, Clonidine patch and ARB. As of 12/16/11, BP is fairly reasonable at 140/45, and has remained stable, since. 5. Acute renal failure  On admission, patient was in ARF, with creatinine of 3.06 and BUN of 83. This was of course, multifactorial, due to dehydration, volume depletion, hypotension and nephrotoxins. Renal status has improved during this hospitalization, but volume status has proven rather problematic, with patient fluctuating from dehydration  to being overhydrated to quick dehydration on diuretics. We are currently holding diuretics and IVF. Oral intake is poor po fluid intake. Encouraging oral fluids. Creatinine was 0.97 on 12/17/11. on 12/16/11, i.e, at baseline. 6. UTI: This is polymicrobial. Urine culture revealed >100K E coli and enterococcus, both sensitive to Rocephin. Patient has concluded therapy, from 12/10/11-12/13/11. She has remained apyrexial, and wcc is normal at 6.6 as of 12/16/11.  7. Normocytic anemia:  Patient did have a normocytic anemia, with hemoglobin of 9.2, at the time of presentation, likely due to acute blood loss on chronic anemia. Likely, there was some hemoconcentration at that time, so the true value was lower. Hemoglobin has remained stable during this hospitalization, and was 9.1 on 12/17/11. 8. Altered mental Status:  Patient was confused at presentation. Likely, this was toxic-metabolic, due to her acute medical issues. This has now resolved, and mental status is now at baseline.  9. DM:  Pre-admission, patient was on insulin pump, and has been under the care of Dr Romero Belling, with good control.  [name] currently being managed with diet, Lantus, SSI and meal coverage with Novolog, and CBGs are reasonable.   Comment: Patient is stable/asymptomatic. [name]  deconditioned, and will benefit from SNF rehab. Aiming discharge on 12/18/11.    LOS: 9 days   Jaz Mallick,CHRISTOPHER 12/17/2011, 2:34 PM

## 2011-12-17 NOTE — Progress Notes (Signed)
Physical Therapy Treatment Patient Details Name: ELISABETH STROM MRN: [id number] DOB: 1926/10/31 Today's Date: 12/17/2011 Time: 1625-     PT Assessment / Plan / Recommendation Comments on Treatment Session  activity tolerance improved with each trial of gait.     Follow Up Recommendations  Skilled nursing facility    Equipment Recommendations  Defer to next venue    Frequency Min 3X/week   Plan Discharge plan remains appropriate;Frequency remains appropriate    Precautions / Restrictions Precautions Precautions: Fall   Pertinent Vitals/Pain No c/o pain    Mobility  Bed Mobility Bed Mobility: Not assessed Transfers Transfers: Sit to Stand;Stand to Sit Sit to Stand: 5: Supervision;From chair/3-in-1 (3 trials) Stand to Sit: 5: Supervision;To chair/3-in-1 (3 trials) Details for Transfer Assistance: MIn verbal cues for safe hand placement and technique Ambulation/Gait Ambulation/Gait Assistance: 4: Min guard Ambulation Distance (Feet): 80 Feet (3 trials 42 feet, 52 feet, 80 feet) Assistive device: Rolling walker Ambulation/Gait Assistance Details: Decreased speed and verbal cues required for flexed posture Gait Pattern: Step-through pattern;Decreased stride length;Trunk flexed Stairs: No Wheelchair Mobility Wheelchair Mobility: No    Exercises General Exercises - Lower Extremity Ankle Circles/Pumps: AROM;20 reps;Both;Seated Short Arc Quad: AROM;Both;10 reps;Seated Heel Slides: AROM;Both;10 reps;Seated Hip ABduction/ADduction: AROM;Both;10 reps;Seated   PT Goals Acute Rehab PT Goals PT Goal Formulation: With patient Time For Goal Achievement: 12/24/11 Potential to Achieve Goals: Good Pt will go Supine/Side to Sit: with modified independence Pt will go Sit to Stand: with supervision;with upper extremity assist PT Goal: Sit to Stand - Progress: Progressing toward goal Pt will go Stand to Sit: with supervision;with upper extremity assist PT Goal: Stand to Sit -  Progress: Progressing toward goal Pt will Ambulate: >150 feet;with supervision;with rolling walker PT Goal: Ambulate - Progress: Progressing toward goal Pt will Go Up / Down Stairs: 1-2 stairs;with min assist;with least restrictive assistive device PT Goal: Up/Down Stairs - Progress: Not met  Visit Information  Last PT Received On: 12/17/11 Assistance Needed: +1    Subjective Data      Cognition  Overall Cognitive Status: Appears within functional limits for tasks assessed/performed Arousal/Alertness: Awake/alert Orientation Level: Oriented X4 / Intact Behavior During Session: [facility] for tasks performed    Balance  Balance Balance Assessed: No  End of Session PT - End of Session Equipment Utilized During Treatment: Gait belt Activity Tolerance: Patient tolerated treatment well Patient left: in chair;with call bell/phone within reach;with chair alarm set Nurse Communication: Mobility status    Filiberto Wamble 12/17/2011, 5:04 PM

## 2011-12-17 NOTE — Progress Notes (Signed)
Utilization Review Completed.Emma Yu T4/29/2013   

## 2011-12-17 NOTE — Progress Notes (Signed)
Clinical Social Worker provided pt SNF bed offers at bedside. Clinical Social Worker contacted pt son by phone and provided SNF bed offers. Pt and pt son discussed that pt has previously been to [facility] in [place] and Visual merchandiser discussed that pt information was initially only sent to [facility], but Visual merchandiser initiated search to [facility] in [place] and notified the facility via telephone that pt was interested in facility. Will await response from [facility], but pt second choice is [facility] if [facility] unable to offer. Clinical Social Worker to follow up with pt in the am to notify of [facility] response. Clinical Social Worker to facilitate pt discharge needs when pt medically stable for discharge.  Jacklynn Lewis, MSW, LCSWA  Clinical Social Work [phone]

## 2011-12-17 NOTE — Discharge Summary (Addendum)
Physician Discharge Summary  Patient ID: Emma Yu MRN: [id number] DOB/AGE: Apr 19, 1927 78 y.o.  Admit date: 12/08/2011 Discharge date: 12/18/2011  Primary Care Physician:  Sanda Linger, MD, MD   Discharge Diagnoses:    Patient Active Problem List  Diagnoses  . HYPERCHOLESTEROLEMIA  . HYPERTENSION  . PERIPHERAL VASCULAR DISEASE  . VENOUS INSUFFICIENCY, LEFT LEG  . OSTEOARTHRITIS  . Abnormal cardiovascular stress test  . Tremor  . UTI (lower urinary tract infection)  . Diabetes mellitus type 1 with peripheral artery disease  . Carotid bruit  . Diarrhea  . Hypotension  . Acute renal failure  . Hyperkalemia  . Dehydration  . Mitral stenosis  . Aortic stenosis  . CAD (coronary artery disease)  . Atrial fibrillation    Medication List  As of 12/18/2011  1:27 PM   STOP taking these medications         aspirin 81 MG tablet      furosemide 40 MG tablet      insulin pump 100 unit/ml Soln      valsartan 320 MG tablet         TAKE these medications         acetaminophen 325 MG tablet   Commonly known as: TYLENOL   Take 325 mg by mouth once as needed. As needed for pain.      aspirin 81 MG chewable tablet   Chew 2 tablets (162 mg total) by mouth daily.      b complex vitamins tablet   Take 1 tablet by mouth daily.      Cinnamon 500 MG capsule   4 capsules daily      cloNIDine 0.1 MG tablet   Commonly known as: CATAPRES   Take 1 tablet (0.1 mg total) by mouth 2 (two) times daily.      Coenzyme Q10 10 MG capsule   (300mg  caps) 1 capsule by mouth daily      diclofenac sodium 1 % Gel   Commonly known as: VOLTAREN   Apply 1 application topically 4 (four) times daily as needed. For pain      diltiazem 240 MG 24 hr capsule   Commonly known as: CARDIZEM CD   Take 1 capsule (240 mg total) by mouth daily.      fish oil-omega-3 fatty acids 1000 MG capsule   Take 2 g by mouth daily.      gabapentin 300 MG capsule   Commonly known as: NEURONTIN   Take  600 mg by mouth at bedtime.      insulin aspart 100 UNIT/ML injection   Commonly known as: novoLOG   Inject 7 Units into the skin 3 (three) times daily after meals.      insulin glargine 100 UNIT/ML injection   Commonly known as: LANTUS   Inject 25 Units into the skin at bedtime.      methocarbamol 500 MG tablet   Commonly known as: ROBAXIN   Take 500 mg by mouth 3 (three) times daily as needed. For muscle spasms      metoprolol tartrate 25 MG tablet   Commonly known as: LOPRESSOR   Take 1 tablet (25 mg total) by mouth 2 (two) times daily.      omeprazole 40 MG capsule   Commonly known as: PRILOSEC   Take 40 mg by mouth daily.      pravastatin 20 MG tablet   Commonly known as: PRAVACHOL   Take 20 mg by mouth every evening.  traMADol 50 MG tablet   Commonly known as: ULTRAM   Take 1 tablet (50 mg total) by mouth every 6 (six) hours as needed. For pain      vitamin C 500 MG tablet   Commonly known as: ASCORBIC ACID   Take 500 mg by mouth daily.      Vitamin D3 2000 UNITS capsule   Take 2,000 Units by mouth daily.             Disposition and Follow-up:  Follow up with primary MD and [facility].  Consults:  cardiology  Dr Willa Rough, [facility].  Significant Diagnostic Studies:  Dg Chest 2 View  12/09/2011  *RADIOLOGY REPORT*  Clinical Data: Repeated falls.  Anemia and dehydration.  CHEST - 2 VIEW  Comparison: 11/10/2010  Findings: Shallow inspiration with atelectasis or infiltration in the left lung base.  Bibasilar changes have improved since the previous study.  Normal heart size and pulmonary vascularity. Calcification of the aorta.  Degenerative changes in the spine.  No pneumothorax.  The no blunting of costophrenic angles.  IMPRESSION: Persistent but improving infiltration or atelectasis in the left lung base since previous study.  Interval resolution of right-sided atelectasis.  Original Report Authenticated By: Marlon Pel, M.D.     Brief H and P: For complete details, refer to admission H and P. However, in brief, this is a 78 year old female, with known history of PAD (peripheral artery disease); Aortic stenosis; Diabetes mellitus; Hypertension; Hyperlipidemia; Venous insufficiency; CHF (congestive heart failure); Atrial fib/flutter, transient; Atrial fibrillation; and DJD (degenerative joint disease) of hip, tremor and repeated repeated falls.  She fell out of bed on 12/08/11, and hit her head resulting in laceration needing sutures at [facility], following which she was discharged home, only to develop diarrhea and weakness. She was then brought to [facility] ED,  where she was found to be anemic and dehydrated with acute renal failure. She was admitted for further evaluation, investigation and management.  Physical Exam: On 12/18/11. General: Comfortable, alert, communicative, fully oriented, not short of breath at rest.  HEENT: Moderate clinical pallor, no jaundice, no conjunctival injection or discharge. Hydration status appears fair. Scalp laceration on vertex, appears to be healing well, and sutures are still in situ.  NECK: Supple, JVP not seen, no carotid bruits, no palpable lymphadenopathy, no palpable goiter.  CHEST: Clinically clear to auscultation, no wheezes, no crackles.  HEART: Sounds 1 and 2 heard, normal, regular. Patient has systolic murmur, radiating to both carotids.  ABDOMEN: Full, soft, non-tender, no palpable organomegaly, no palpable masses, normal bowel sounds.  GENITALIA: Not examined.  LOWER EXTREMITIES: No pitting edema, palpable peripheral pulses.  MUSCULOSKELETAL SYSTEM: Generalized osteoarthritic changes, otherwise, normal.  CENTRAL NERVOUS SYSTEM: No focal neurologic deficit on gross examination.  Hospital Course:  1. Falls/Head trauma:  Patient has had recurrent mechanical falls, deemed multifactorial, secondary to frailty, tremor and gait instability. She sustained a fall on  12/08/11, complicated by a scalp laceration, addressed at [facility] with stiches. Fortunately, there were no intracranial injuries or bony injuries. The laceration is healing well, and sutures were removed on 12/17/2011.  2. Afib w/ RVR:  Patient has a known history of Atrial Flutter/fibrillation, but was in SR at presentation. On 11/2411, at 8:24, she went into atrial fibrillation at a rate of 174 bpm and has remained in atrial fibrillation since then. Cardizem IV was used for rate control with success and on 12/13/10, she was transitioned to oral  Cardizem. Cardiology consultation was provided by Dr Willa Rough. Fortunately, patient had no evidence of ACS. Beta-blocker was commenced on 12/16/11, and HR is now controlled at 68-88/min. Given recent fall with head trauma and frequent falls, anticoagulation would carry too high a risk. [name] therefore, on Aspirin.  3. Pulmonary edema/Aortic stenosis- moderate:  Patient was dehydrated, volume-depleted and hypotensive at the time of presentation, and was successfully managed with iv fluids, However, she unfortunately, went into pulmonary edema, due to overhydration, against a background of fast atrial fibrillation, and known moderate aortic stenosis. This has resolved with diuresis. Echocardiogram of 12/10/11, showed moderate LVH, EF 65-70%, and moderate aortic stenosis. Because of bilateral carotid bruits, Carotid ultrasound was done on 12/17/11, showed 60-79% right ICA stenosis and 50-59% left ICA stenosis. Discussed with Dr Imogene Burn, vascular surgeon, on 12/17/11, and he has recommended outpatient follow up in his office, with serial ultrasound. 4. Dehydration/hypotension:  Patient was hypotensive with systolic BP in the 80s on presentation, due to dehydration/volume depletion secondary to diarrhea, against a background of poor oral intake, continuing antihypertensive medication and diuretic therapy. Culprit medications were held, including Hydralazine, Lasix,  Clonidine patch and ARB. As of 12/16/11, BP is fairly reasonable at 140/45, and has remained stable, since.  5. Acute renal failure  On admission, patient was in ARF, with creatinine of 3.06 and BUN of 83. This was of course, multifactorial, due to dehydration, volume depletion, hypotension and nephrotoxins. Renal status has improved during this hospitalization, but volume status has proven rather problematic, with patient fluctuating from dehydration to being overhydrated to quick dehydration on diuretics. Diuretics are currently on hold. IV fluids were discontinued and oral fluids encouraged, with excellent results. Creatinine was 0.97 on 12/17/11. on 12/16/11, i.e, at baseline.  6. UTI:  This is polymicrobial. Urine culture revealed >100, 000 colonies of E coli and enterococcus, both sensitive to Rocephin. Patient has concluded therapy, from 12/10/11-12/13/11. She has remained apyrexial, and wcc is normal at 6.6 as of 12/16/11.  7. Normocytic anemia:  Patient did have a normocytic anemia, with hemoglobin of 9.2, at the time of presentation, likely due to acute blood loss on chronic anemia. Likely, there was some hemoconcentration at that time, so the true value was lower. Hemoglobin has remained stable during this hospitalization, and was 9.1 on 12/17/11.  8. Altered mental Status:  Patient was confused at presentation. Likely, this was toxic-metabolic, due to her acute medical issues. This has now resolved, and mental status is now at baseline.  9. DM:  Pre-admission, patient was on insulin pump, and has been under the care of Dr Romero Belling, with good control. She was managed with diet, Lantus, SSI and meal coverage with Novolog, and CBGs remained controlled.   Comment: Patient is stable for discharge on 12/18/11.   Time spent on Discharge: 45 mins.  Signed: Kamilla Hands,CHRISTOPHER 12/18/2011, 1:27 PM

## 2011-12-17 NOTE — Progress Notes (Signed)
Bilateral carotid artery duplex completed at 10:10.  Preliminary report is 60-79% ICA stenosis on the right and 50-59% stenosis on the left.

## 2011-12-17 NOTE — Progress Notes (Signed)
12/17/2011 [facility], [facility] Case Management Note [phone]    CARE MANAGEMENT NOTE 12/17/2011  Patient:  Emma Yu, Emma Yu   Account Number:  [id number]  Date Initiated:  12/17/2011  Documentation initiated by:  Fransico Michael  Subjective/Objective Assessment:   admitted on 12/08/11 with c/o head injury     Action/Plan:   ct of head  IVF   Anticipated DC Date:  12/20/2011   Anticipated DC Plan:  SKILLED NURSING FACILITY      DC Planning Services  CM consult      Choice offered to / List presented to:             Status of service:  In process, will continue to follow Medicare Important Message given?   (If response is "NO", the following Medicare IM given date fields will be blank) Date Medicare IM given:   Date Additional Medicare IM given:    Discharge Disposition:    Per UR Regulation:    If discussed at Long Length of Stay Meetings, dates discussed:    Comments:  PCP: Emma Yu, Emma Yu (son) [phone]  12/17/11-1007-J.Lutricia Horsfall [phone]      78yo female patient admitted on 12/08/11 with a head injury s/p fall. Prior to admission, pateitn lived at home with support from adult children. Independent with ADLs. Physical therapy is recommending skilled nursing facility. CM will continue to monitor for discharge/home needs.

## 2011-12-17 NOTE — Plan of Care (Signed)
Problem: Discharge Progression Outcomes Goal: Activity appropriate for discharge plan Outcome: Progressing Patient ambulating with front wheel walker with standby assist

## 2011-12-17 NOTE — Progress Notes (Signed)
Patient ID: Emma Yu, female   DOB: [birth year], 78 y.o.   MRN: [id number] SUBJECTIVE: Stable, denies palps, SOB,  Going to rehab first not home   Filed Vitals:   12/16/11 1406 12/16/11 2011 12/16/11 2100 12/17/11 0600  BP: 100/52 146/45 113/50 144/49  Pulse: 81  100 87  Temp: 98.6 F (37 C)  99.2 F (37.3 C) 97.3 F (36.3 C)  TempSrc: Oral     Resp: 16  18 14   Height:      Weight:      SpO2: 96%  94% 95%    Intake/Output Summary (Last 24 hours) at 12/17/11 0805 Last data filed at 12/16/11 1700  Gross per 24 hour  Intake    360 ml  Output    300 ml  Net     60 ml    TELEMETRY: Reviewed telemetry pt in AF 90-110s, this AM  LABS: Basic Metabolic Panel:  Basename 12/15/11 0525  NA 141  K 4.2  CL 106  CO2 27  GLUCOSE 126*  BUN 55*  CREATININE 1.45*  CALCIUM 9.8  MG --  PHOS --  CBC:  Basename 12/17/11 0707 12/15/11 0525  WBC 6.9 6.6  NEUTROABS -- --  HGB 9.1* 8.9*  HCT 28.7* 28.1*  MCV 94.4 94.0  PLT 225 220   Cardiac Enzymes: No results found for this basename: CKTOTAL:3,CKMB:3,CKMBINDEX:3,TROPONINI:3 in the last 72 hours BNP: No components found with this basename: POCBNP:3 D-Dimer: No results found for this basename: DDIMER:2 in the last 72 hours Hemoglobin A1C: No results found for this basename: HGBA1C in the last 72 hours Fasting Lipid Panel: No results found for this basename: CHOL,HDL,LDLCALC,TRIG,CHOLHDL,LDLDIRECT in the last 72 hours Thyroid Function Tests: No results found for this basename: TSH,T4TOTAL,FREET3,T3FREE,THYROIDAB in the last 72 hours Anemia Panel: No results found for this basename: VITAMINB12,FOLATE,FERRITIN,TIBC,IRON,RETICCTPCT in the last 72 hours  RADIOLOGY: Dg Chest Port 1 View  12/12/2011  *RADIOLOGY REPORT*  Clinical Data: Pulmonary edema.  PORTABLE CHEST - 1 VIEW  Comparison: 12/10/2011.  Findings: Pulmonary vascular congestion are present.  Aortic arch atherosclerosis.  Interstitial pulmonary edema is present.  Compared to prior, there is a substantial reduction in pulmonary edema and improving aeration at the lung bases.  Cardiopericardial silhouette upper limits of normal.  IMPRESSION: Improving pulmonary aeration with mild persistent basilar interstitial pulmonary edema.  Original Report Authenticated By: Andreas Newport, M.D.   PHYSICAL EXAM: GENERAL: well-nourished, well-developed; NAD HEENT: NCAT, PERRLA, EOMI; sclera clear; no xanthelasma NECK: bilateral carotid bruits; no JVD; no TM LUNGS: faint basilar crackles CARDIAC: Irreg fhythm, increased rate (S1, S2); II/VI sys murmur at base ABDOMEN: soft, non-tender; intact BS EXTREMETIES: intact distal pulses; no significant peripheral edema SKIN: warm/dry; no obvious rash/lesions  Laceration on top of head MUSCULOSKELETAL: no joint deformity NEURO: no focal deficit; NL affect  ASSESSMENT: 1  Atrial fib with a CVR   - on ASA 162 PTA  - Cardizem 240 daily  -lopresser 12.5 bid 2  MS/AS - medical therapy.  3  Diastolic CHF secondary to 1 and 2. 4  Carotid bruits, bilateral  5  S/p Fall, no LOC  Impression Increase lopresser to 25 bid.  ? Why is she on clonidine.  Would wean this as her BP doesn't seem bad enough to warrant 3rd line agent.   Decrease to bid for starters.  Should have weaning parameters if going to rehab     [employer]

## 2011-12-18 LAB — GLUCOSE, CAPILLARY
Glucose-Capillary: 112 mg/dL — ABNORMAL HIGH (ref 70–99)
Glucose-Capillary: 227 mg/dL — ABNORMAL HIGH (ref 70–99)

## 2011-12-18 MED ORDER — TRAMADOL HCL 50 MG PO TABS: 50.0000 mg | ORAL_TABLET | Freq: Four times a day (QID) | ORAL | Status: DC | PRN

## 2011-12-18 MED ORDER — METOPROLOL TARTRATE 25 MG PO TABS: 25.0000 mg | ORAL_TABLET | Freq: Two times a day (BID) | ORAL | Status: DC

## 2011-12-18 MED ORDER — INSULIN GLARGINE 100 UNIT/ML ~~LOC~~ SOLN: 25.0000 [IU] | Freq: Every day | SUBCUTANEOUS | Status: DC

## 2011-12-18 MED ORDER — DILTIAZEM HCL ER COATED BEADS 240 MG PO CP24: 240.0000 mg | ORAL_CAPSULE | Freq: Every day | ORAL | Status: DC

## 2011-12-18 MED ORDER — INSULIN ASPART 100 UNIT/ML ~~LOC~~ SOLN: 7.0000 [IU] | Freq: Three times a day (TID) | SUBCUTANEOUS | Status: DC

## 2011-12-18 MED ORDER — CLONIDINE HCL 0.1 MG PO TABS: 0.1000 mg | ORAL_TABLET | Freq: Two times a day (BID) | ORAL | Status: DC

## 2011-12-18 MED ORDER — ASPIRIN 81 MG PO CHEW: 162.0000 mg | CHEWABLE_TABLET | Freq: Every day | ORAL | Status: DC

## 2011-12-18 NOTE — Progress Notes (Signed)
   Patient ID: Emma Yu, female   DOB: 1927-03-27, 78 y.o.   MRN: [id number] SUBJECTIVE: Stable, denies palps, SOB,  Going to rehab first not home   Filed Vitals:   12/17/11 1018 12/17/11 1317 12/17/11 2100 12/18/11 0500  BP: 130/64 125/43 134/39 172/62  Pulse: 116 68 97 89  Temp:  99 F (37.2 C) 99 F (37.2 C) 98.9 F (37.2 C)  TempSrc:  Oral Oral Oral  Resp:  16 16 24   Height:      Weight:      SpO2:  93% 98% 97%    Intake/Output Summary (Last 24 hours) at 12/18/11 0817 Last data filed at 12/17/11 2100  Gross per 24 hour  Intake    600 ml  Output      4 ml  Net    596 ml    TELEMETRY: Reviewed telemetry pt in AF 90-120s , this AM  LABS: Basic Metabolic Panel:  [facility] 12/17/11 0707  NA 142  K 4.4  CL 108  CO2 26  GLUCOSE 130*  BUN 26*  CREATININE 0.97  CALCIUM 10.0  MG --  PHOS --  CBC:  Basename 12/17/11 0707  WBC 6.9  NEUTROABS --  HGB 9.1*  HCT 28.7*  MCV 94.4  PLT 225    RADIOLOGY: Dg Chest Port 1 View  12/12/2011  *RADIOLOGY REPORT*  Clinical Data: Pulmonary edema.  PORTABLE CHEST - 1 VIEW  Comparison: 12/10/2011.  Findings: Pulmonary vascular congestion are present.  Aortic arch atherosclerosis.  Interstitial pulmonary edema is present. Compared to prior, there is a substantial reduction in pulmonary edema and improving aeration at the lung bases.  Cardiopericardial silhouette upper limits of normal.  IMPRESSION: Improving pulmonary aeration with mild persistent basilar interstitial pulmonary edema.  Original Report Authenticated By: Andreas Newport, M.D.   PHYSICAL EXAM: GENERAL: well-nourished, well-developed; NAD HEENT: NCAT, PERRLA, EOMI; sclera clear; no xanthelasma NECK: bilateral carotid bruits; no JVD; no TM LUNGS: faint basilar crackles CARDIAC: Irreg fhythm, increased rate (S1, S2); II/VI sys murmur at base ABDOMEN: soft, non-tender; intact BS EXTREMETIES: intact distal pulses; no significant peripheral edema SKIN:  warm/dry; no obvious rash/lesions  Laceration on top of head MUSCULOSKELETAL: no joint deformity NEURO: no focal deficit; NL affect  ASSESSMENT: 1  Atrial fib with a CVR   - on ASA 162 PTA  - Cardizem 240 daily  -lopresser 25 bid dose increased yesterday 2  MS/AS - medical therapy.  3  Diastolic CHF secondary to 1 and 2. 4  Carotid bruits, bilateral  5  S/p Fall, no LOC 6. HTN:  Weaning clonidine  Bid at this point       [employer]

## 2011-12-18 NOTE — Clinical Documentation Improvement (Signed)
CHF DOCUMENTATION CLARIFICATION QUERY  THIS DOCUMENT IS NOT A PERMANENT PART OF THE MEDICAL RECORD  TO RESPOND TO THE THIS QUERY, FOLLOW THE INSTRUCTIONS BELOW:  1. If needed, update documentation for the patient's encounter via the notes activity.  2. Access this query again and click edit on the In [employer].  3. After updating, or not, click F2 to complete all highlighted (required) fields concerning your review. Select "additional documentation in the medical record" OR "no additional documentation provided".  4. Click Sign note button.  5. The deficiency will fall out of your In Basket *Please let us know if you are not able to complete this workflow by phone or e-mail (listed below).  Please update your documentation within the medical record to reflect your response to this query.                                                                                    12/18/11  Dear Dr. [facility],  In a better effort to capture your patient's severity of illness, reflect appropriate length of stay and utilization of resources, a review of the patient medical record has revealed the following indicators the diagnosis of Heart Failure.    Based on your clinical judgment, please clarify and document in a progress note and/or discharge summary the clinical condition associated with the following supporting information:  In responding to this query please exercise your independent judgment.  The fact that a query is asked, does not imply that any particular answer is desired or expected.  Per  note patient has "diastolic heart failure"  .  Please clarify the acuity of the heart failure documented if known.  Thank you   Possible Clinical Conditions?  Chronic Diastolic Congestive Heart Failure  Acute Diastolic Congestive Heart Failure  Acute on Chronic Diastolic Congestive Heart Failure  Other Condition  Cannot Clinically Determine   Reviewed:  no additional  documentation provided  Thank You,  Leonette Most Addison  Clinical Documentation Specialist RN, BSN :  Pager [phone] HIM off [phone]  Health Information Management [place]

## 2011-12-18 NOTE — Progress Notes (Signed)
Clinical Social Worker facilitated pt discharge needs including contacting facility and notifying pt and pt family at bedside. Pt transporting to [facility] in [place] by pt son by car. No further social work needs identified at this time. Clinical Social Worker signing off.   Jacklynn Lewis, MSW, LCSWA  Clinical Social Work [phone]

## 2011-12-18 NOTE — Progress Notes (Signed)
Physical Therapy Treatment Patient Details Name: Emma Yu MRN: [id number] DOB: 1926/10/26 Today's Date: 12/18/2011 Time: 6213-0865 PT Time Calculation (min): 25 min  PT Assessment / Plan / Recommendation Comments on Treatment Session  Pt continues to demonstate improved activity tolerance and functional LE strenght, but needs cues for safety with gait and transfers.  Pt needed several rest breaks throughout tx today, reviewed safety techniques with pt and importance of lowering falls risk by keeping equipment with pt throughout all transfers.     Follow Up Recommendations  Skilled nursing facility    Equipment Recommendations  Defer to next venue    Frequency Min 3X/week   Plan Discharge plan remains appropriate;Frequency remains appropriate    Precautions / Restrictions Precautions Precautions: Fall Restrictions Weight Bearing Restrictions: No   Pertinent Vitals/Pain No complaints    Mobility  Bed Mobility Bed Mobility: Not assessed Transfers Sit to Stand: 5: Supervision;With upper extremity assist;With armrests;From chair/3-in-1 (x3) Stand to Sit: 5: Supervision;With upper extremity assist;With armrests;To chair/3-in-1 Details for Transfer Assistance: Overall, pt demonstrates safe hand placement, but needed cues one time to continue bringing RW with her to bed Ambulation/Gait Ambulation/Gait Assistance: 5: Supervision Ambulation Distance (Feet): 120 Feet Assistive device: Rolling walker Ambulation/Gait Assistance Details: S for safety with cues for posture and walking closer to RW. Pt reports increased difficulty with gait with upright posture.  Gait Pattern: Step-through pattern;Decreased stride length;Trunk flexed Stairs: No Wheelchair Mobility Wheelchair Mobility: No    Exercises General Exercises - Lower Extremity Ankle Circles/Pumps: AROM;20 reps;Both;Seated Long Arc Quad: Seated;15 reps;Both;Strengthening;AROM Hip Flexion/Marching: Seated;10  reps;Both;Strengthening;AROM   PT Goals Acute Rehab PT Goals PT Goal: Sit to Stand - Progress: Met PT Goal: Stand to Sit - Progress: Met PT Goal: Ambulate - Progress: Progressing toward goal PT Goal: Up/Down Stairs - Progress: Not met  Visit Information  Last PT Received On: 12/18/11 Assistance Needed: +1    Subjective Data  Subjective: Pt feeling well today, ready to go to  Patient Stated Goal: Get my legs stronger   Cognition  Overall Cognitive Status: Appears within functional limits for tasks assessed/performed Arousal/Alertness: Awake/alert Orientation Level: Oriented X4 / Intact Behavior During Session: [facility] for tasks performed    Balance  Balance Balance Assessed: Yes Static Standing Balance Static Standing - Balance Support: Left upper extremity supported Static Standing - Level of Assistance: 4: Min assist Static Standing - Comment/# of Minutes: Pt stood at RW to call son on phone x90 sec for LE strength and activity tolerance. Min A needed due to visibly shaking LEs.   End of Session PT - End of Session Equipment Utilized During Treatment: Gait belt Activity Tolerance: Patient tolerated treatment well Patient left: in bed;with call bell/phone within reach;with bed alarm set    Virl Cagey, PT [phone]  12/18/2011, 12:45 PM

## 2011-12-18 NOTE — Progress Notes (Signed)
Clinical Social Worker spoke with [facility] and facility is able to offer a bed and bed available today. Clinical Social Worker notified pt at bedside and contacted pt son and left voice message. MD notified. Clinical Social Worker to facilitate pt discharge needs this afternoon.  Jacklynn Lewis, MSW, LCSWA  Clinical Social Work [phone]

## 2011-12-18 NOTE — Progress Notes (Signed)
Skin abrasion noted on R side of chest more likely from monitor lead sticker. Betadine applied.

## 2011-12-24 IMAGING — CR DG CHEST 1V PORT
1 series · 1 of 1 positions shown · non-contrast
Comparison: 11/05/2010

CLINICAL DATA: Pulmonary edema/on ventilator

PORTABLE CHEST - 1 VIEW

[AP]
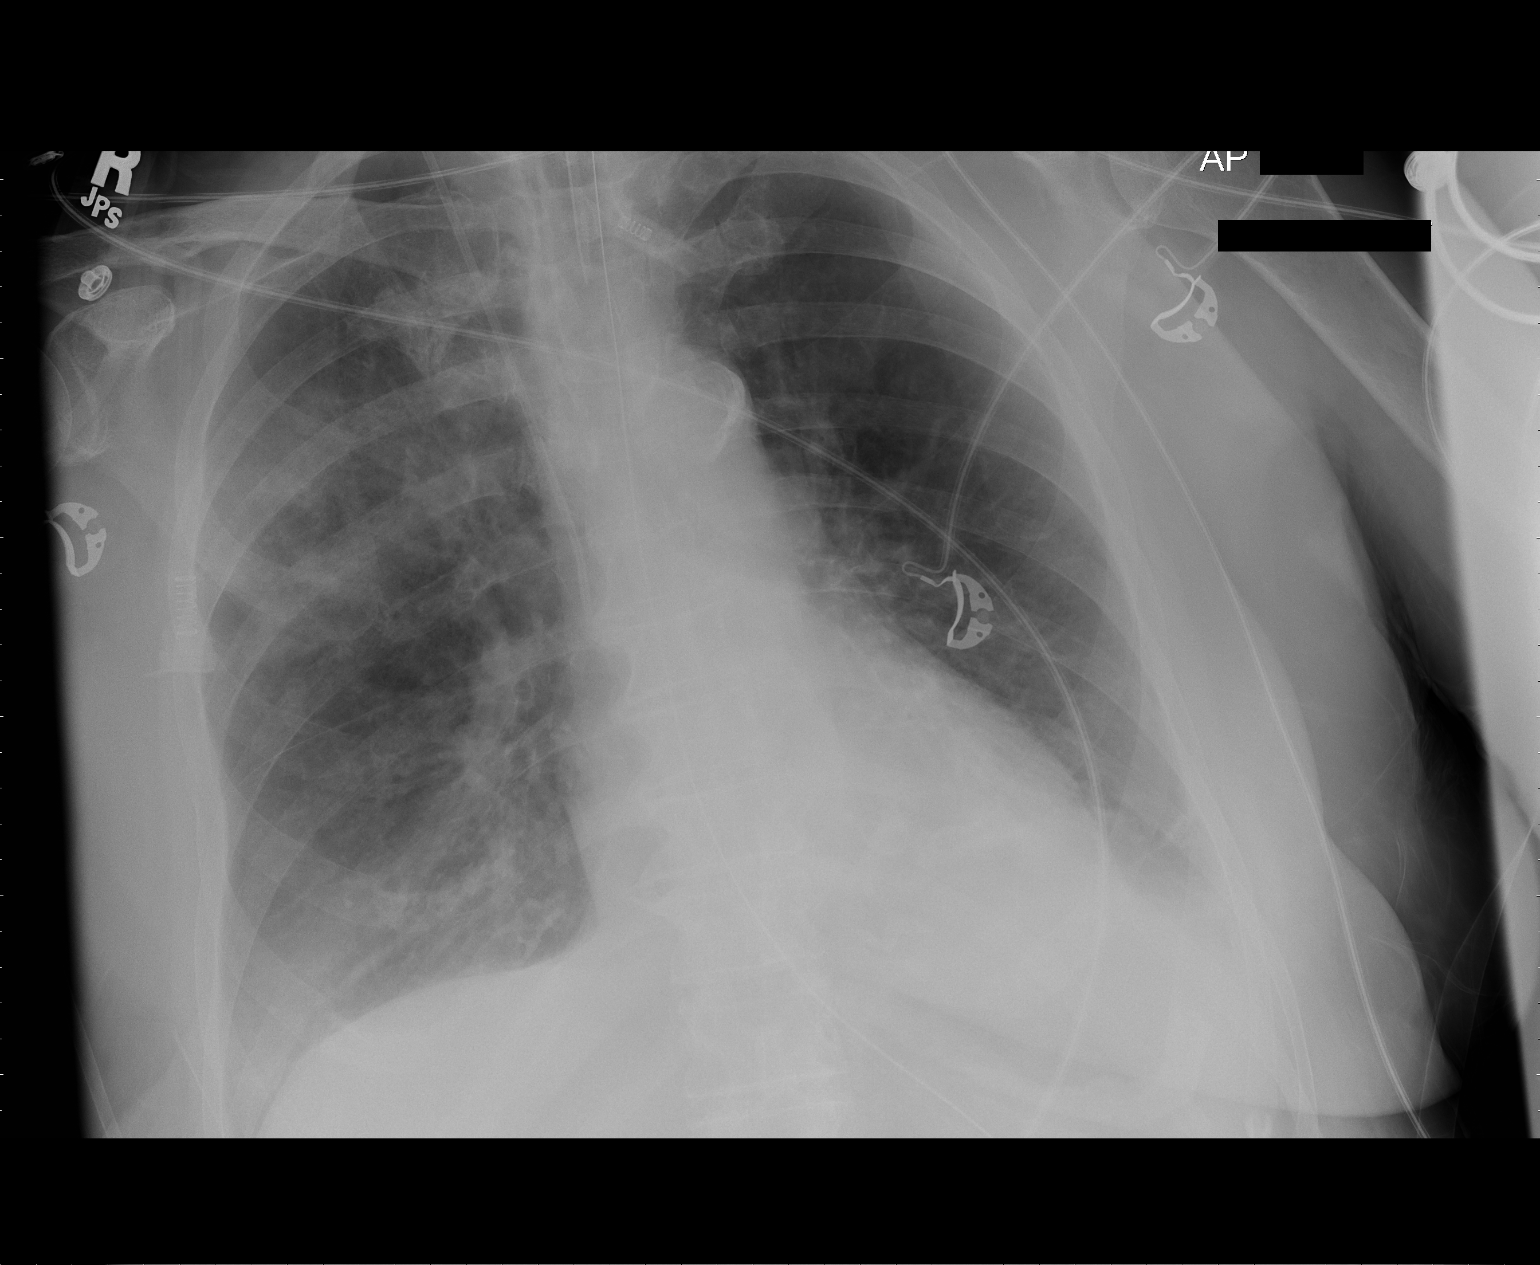

[1 of 1 positions shown; findings below may reference images not displayed]

FINDINGS: Heart size normal.  Lung aeration improved significantly.
Support apparatus stable.
IMPRESSION: Continued improvement in bilateral lung aeration.  No new findings.

## 2011-12-25 ENCOUNTER — Inpatient Hospital Stay (HOSPITAL_COMMUNITY)
Admission: EM | Admit: 2011-12-25 | Discharge: 2011-12-31 | DRG: 291 | Disposition: A | Discharge status: Skilled Nursing Facility | Payer: Medicare Other | Attending: Cardiovascular Disease | Admitting: Cardiovascular Disease

## 2011-12-25 ENCOUNTER — Emergency Department (HOSPITAL_COMMUNITY): Payer: Medicare Other

## 2011-12-25 ENCOUNTER — Encounter (HOSPITAL_COMMUNITY): Payer: Self-pay | Admitting: Physical Medicine and Rehabilitation

## 2011-12-25 DIAGNOSIS — D72829 Elevated white blood cell count, unspecified: Secondary | ICD-10-CM | POA: Diagnosis not present

## 2011-12-25 DIAGNOSIS — E118 Type 2 diabetes mellitus with unspecified complications: Secondary | ICD-10-CM

## 2011-12-25 DIAGNOSIS — I080 Rheumatic disorders of both mitral and aortic valves: Secondary | ICD-10-CM | POA: Diagnosis present

## 2011-12-25 DIAGNOSIS — E1165 Type 2 diabetes mellitus with hyperglycemia: Secondary | ICD-10-CM

## 2011-12-25 DIAGNOSIS — E875 Hyperkalemia: Secondary | ICD-10-CM | POA: Diagnosis present

## 2011-12-25 DIAGNOSIS — I10 Essential (primary) hypertension: Secondary | ICD-10-CM | POA: Diagnosis present

## 2011-12-25 DIAGNOSIS — Z794 Long term (current) use of insulin: Secondary | ICD-10-CM

## 2011-12-25 DIAGNOSIS — E860 Dehydration: Secondary | ICD-10-CM | POA: Diagnosis present

## 2011-12-25 DIAGNOSIS — Z79899 Other long term (current) drug therapy: Secondary | ICD-10-CM

## 2011-12-25 DIAGNOSIS — E785 Hyperlipidemia, unspecified: Secondary | ICD-10-CM | POA: Diagnosis present

## 2011-12-25 DIAGNOSIS — I4891 Unspecified atrial fibrillation: Secondary | ICD-10-CM

## 2011-12-25 DIAGNOSIS — M25559 Pain in unspecified hip: Secondary | ICD-10-CM

## 2011-12-25 DIAGNOSIS — I658 Occlusion and stenosis of other precerebral arteries: Secondary | ICD-10-CM | POA: Diagnosis present

## 2011-12-25 DIAGNOSIS — R112 Nausea with vomiting, unspecified: Secondary | ICD-10-CM

## 2011-12-25 DIAGNOSIS — I350 Nonrheumatic aortic (valve) stenosis: Secondary | ICD-10-CM

## 2011-12-25 DIAGNOSIS — D509 Iron deficiency anemia, unspecified: Secondary | ICD-10-CM | POA: Diagnosis present

## 2011-12-25 DIAGNOSIS — J90 Pleural effusion, not elsewhere classified: Secondary | ICD-10-CM | POA: Diagnosis present

## 2011-12-25 DIAGNOSIS — I509 Heart failure, unspecified: Secondary | ICD-10-CM

## 2011-12-25 DIAGNOSIS — Z7982 Long term (current) use of aspirin: Secondary | ICD-10-CM

## 2011-12-25 DIAGNOSIS — I5033 Acute on chronic diastolic (congestive) heart failure: Principal | ICD-10-CM | POA: Diagnosis present

## 2011-12-25 DIAGNOSIS — J9600 Acute respiratory failure, unspecified whether with hypoxia or hypercapnia: Secondary | ICD-10-CM | POA: Diagnosis not present

## 2011-12-25 DIAGNOSIS — I739 Peripheral vascular disease, unspecified: Secondary | ICD-10-CM | POA: Diagnosis present

## 2011-12-25 DIAGNOSIS — I872 Venous insufficiency (chronic) (peripheral): Secondary | ICD-10-CM | POA: Diagnosis present

## 2011-12-25 DIAGNOSIS — I5023 Acute on chronic systolic (congestive) heart failure: Secondary | ICD-10-CM

## 2011-12-25 DIAGNOSIS — E119 Type 2 diabetes mellitus without complications: Secondary | ICD-10-CM | POA: Diagnosis present

## 2011-12-25 DIAGNOSIS — N179 Acute kidney failure, unspecified: Secondary | ICD-10-CM

## 2011-12-25 DIAGNOSIS — I6529 Occlusion and stenosis of unspecified carotid artery: Secondary | ICD-10-CM | POA: Diagnosis present

## 2011-12-25 DIAGNOSIS — N390 Urinary tract infection, site not specified: Secondary | ICD-10-CM | POA: Diagnosis not present

## 2011-12-25 DIAGNOSIS — Z96649 Presence of unspecified artificial hip joint: Secondary | ICD-10-CM

## 2011-12-25 HISTORY — DX: Acute respiratory failure, unspecified whether with hypoxia or hypercapnia: J96.00

## 2011-12-25 LAB — BASIC METABOLIC PANEL
CO2: 25 mEq/L (ref 19–32)
Chloride: 106 mEq/L (ref 96–112)
Sodium: 139 mEq/L (ref 135–145)

## 2011-12-25 LAB — FERRITIN: Ferritin: 45 ng/mL (ref 10–291)

## 2011-12-25 LAB — PRO B NATRIURETIC PEPTIDE: Pro B Natriuretic peptide (BNP): 2181 pg/mL — ABNORMAL HIGH (ref 0–450)

## 2011-12-25 LAB — RETICULOCYTES
RBC.: 3.2 MIL/uL — ABNORMAL LOW (ref 3.87–5.11)
Retic Count, Absolute: 124.8 10*3/uL (ref 19.0–186.0)
Retic Ct Pct: 3.9 % — ABNORMAL HIGH (ref 0.4–3.1)

## 2011-12-25 LAB — DIFFERENTIAL
Basophils Absolute: 0 10*3/uL (ref 0.0–0.1)
Lymphocytes Relative: 7 % — ABNORMAL LOW (ref 12–46)
Monocytes Absolute: 0.9 10*3/uL (ref 0.1–1.0)
Neutro Abs: 8 10*3/uL — ABNORMAL HIGH (ref 1.7–7.7)

## 2011-12-25 LAB — CBC
HCT: 26.5 % — ABNORMAL LOW (ref 36.0–46.0)
Platelets: 281 10*3/uL (ref 150–400)
RBC: 2.86 MIL/uL — ABNORMAL LOW (ref 3.87–5.11)
RDW: 16.1 % — ABNORMAL HIGH (ref 11.5–15.5)
WBC: 9.7 10*3/uL (ref 4.0–10.5)

## 2011-12-25 LAB — VITAMIN B12: Vitamin B-12: 894 pg/mL (ref 211–911)

## 2011-12-25 LAB — PROTIME-INR: Prothrombin Time: 15.3 seconds — ABNORMAL HIGH (ref 11.6–15.2)

## 2011-12-25 LAB — GLUCOSE, CAPILLARY: Glucose-Capillary: 177 mg/dL — ABNORMAL HIGH (ref 70–99)

## 2011-12-25 LAB — IRON AND TIBC: TIBC: 316 ug/dL (ref 250–470)

## 2011-12-25 MED ORDER — CINNAMON 500 MG PO CAPS: 2000.0000 mg | ORAL_CAPSULE | Freq: Every day | ORAL | Status: DC

## 2011-12-25 MED ORDER — ACETAMINOPHEN 325 MG PO TABS
325.0000 mg | ORAL_TABLET | Freq: Every day | ORAL | Status: DC | PRN
  Administered 2011-12-25 – 2011-12-28 (×2): 325 mg via ORAL
  Filled 2011-12-25 (×2): qty 1

## 2011-12-25 MED ORDER — SODIUM CHLORIDE 0.9 % IV SOLN: 250.0000 mL | INTRAVENOUS | Status: DC | PRN

## 2011-12-25 MED ORDER — METOPROLOL TARTRATE 25 MG PO TABS
25.0000 mg | ORAL_TABLET | Freq: Two times a day (BID) | ORAL | Status: DC
  Administered 2011-12-25 – 2011-12-27 (×4): 25 mg via ORAL
  Filled 2011-12-25 (×5): qty 1

## 2011-12-25 MED ORDER — CLONIDINE HCL 0.1 MG PO TABS
0.1000 mg | ORAL_TABLET | Freq: Two times a day (BID) | ORAL | Status: DC
  Administered 2011-12-25 – 2011-12-31 (×12): 0.1 mg via ORAL
  Filled 2011-12-25 (×13): qty 1

## 2011-12-25 MED ORDER — ASPIRIN 325 MG PO TABS
325.0000 mg | ORAL_TABLET | Freq: Once | ORAL | Status: AC
  Administered 2011-12-25: 325 mg via ORAL
  Filled 2011-12-25: qty 1

## 2011-12-25 MED ORDER — TRAMADOL HCL 50 MG PO TABS
50.0000 mg | ORAL_TABLET | Freq: Four times a day (QID) | ORAL | Status: DC | PRN
  Filled 2011-12-25: qty 1

## 2011-12-25 MED ORDER — SODIUM CHLORIDE 0.9 % IJ SOLN
3.0000 mL | INTRAMUSCULAR | Status: DC | PRN
  Administered 2011-12-26: 3 mL via INTRAVENOUS

## 2011-12-25 MED ORDER — DICLOFENAC SODIUM 1 % TD GEL
1.0000 "application " | Freq: Four times a day (QID) | TRANSDERMAL | Status: DC | PRN
  Filled 2011-12-25: qty 100

## 2011-12-25 MED ORDER — INSULIN ASPART 100 UNIT/ML ~~LOC~~ SOLN
0.0000 [IU] | Freq: Three times a day (TID) | SUBCUTANEOUS | Status: DC
  Administered 2011-12-26 (×3): 3 [IU] via SUBCUTANEOUS
  Administered 2011-12-27 (×2): 2 [IU] via SUBCUTANEOUS
  Administered 2011-12-27: 5 [IU] via SUBCUTANEOUS
  Administered 2011-12-28 (×2): 3 [IU] via SUBCUTANEOUS
  Administered 2011-12-28: 5 [IU] via SUBCUTANEOUS
  Administered 2011-12-29 (×2): 3 [IU] via SUBCUTANEOUS
  Administered 2011-12-29: 5 [IU] via SUBCUTANEOUS
  Administered 2011-12-30: 3 [IU] via SUBCUTANEOUS
  Administered 2011-12-30 (×2): 5 [IU] via SUBCUTANEOUS
  Administered 2011-12-31: 3 [IU] via SUBCUTANEOUS
  Administered 2011-12-31: 11 [IU] via SUBCUTANEOUS

## 2011-12-25 MED ORDER — OMEGA-3-ACID ETHYL ESTERS 1 G PO CAPS
1.0000 g | ORAL_CAPSULE | Freq: Every day | ORAL | Status: DC
  Administered 2011-12-26 – 2011-12-31 (×6): 1 g via ORAL
  Filled 2011-12-25 (×7): qty 1

## 2011-12-25 MED ORDER — ZOLPIDEM TARTRATE 5 MG PO TABS
5.0000 mg | ORAL_TABLET | Freq: Every evening | ORAL | Status: DC | PRN
  Administered 2011-12-30: 5 mg via ORAL
  Filled 2011-12-25: qty 1

## 2011-12-25 MED ORDER — B COMPLEX PO TABS: 1.0000 | ORAL_TABLET | Freq: Every day | ORAL | Status: DC

## 2011-12-25 MED ORDER — PANTOPRAZOLE SODIUM 40 MG PO TBEC
40.0000 mg | DELAYED_RELEASE_TABLET | Freq: Every day | ORAL | Status: DC
  Administered 2011-12-26 – 2011-12-31 (×6): 40 mg via ORAL
  Filled 2011-12-25 (×5): qty 1

## 2011-12-25 MED ORDER — VITAMIN C 500 MG PO TABS
500.0000 mg | ORAL_TABLET | Freq: Every day | ORAL | Status: DC
  Administered 2011-12-25 – 2011-12-31 (×7): 500 mg via ORAL
  Filled 2011-12-25 (×7): qty 1

## 2011-12-25 MED ORDER — B COMPLEX-C PO TABS
1.0000 | ORAL_TABLET | Freq: Every day | ORAL | Status: DC
  Administered 2011-12-26 – 2011-12-31 (×6): 1 via ORAL
  Filled 2011-12-25 (×7): qty 1

## 2011-12-25 MED ORDER — CO Q 10 100 MG PO CAPS: 300.0000 mg | ORAL_CAPSULE | Freq: Every day | ORAL | Status: DC

## 2011-12-25 MED ORDER — ONDANSETRON HCL 4 MG/2ML IJ SOLN: 4.0000 mg | Freq: Four times a day (QID) | INTRAMUSCULAR | Status: DC | PRN

## 2011-12-25 MED ORDER — VITAMIN D3 25 MCG (1000 UNIT) PO TABS
2000.0000 [IU] | ORAL_TABLET | Freq: Every day | ORAL | Status: DC
  Administered 2011-12-25 – 2011-12-31 (×7): 2000 [IU] via ORAL
  Filled 2011-12-25 (×7): qty 2

## 2011-12-25 MED ORDER — FUROSEMIDE 10 MG/ML IJ SOLN
20.0000 mg | Freq: Two times a day (BID) | INTRAMUSCULAR | Status: DC
  Administered 2011-12-25 – 2011-12-29 (×8): 20 mg via INTRAVENOUS
  Filled 2011-12-25 (×11): qty 2

## 2011-12-25 MED ORDER — GABAPENTIN 300 MG PO CAPS
600.0000 mg | ORAL_CAPSULE | Freq: Every day | ORAL | Status: DC
  Administered 2011-12-25 – 2011-12-30 (×6): 600 mg via ORAL
  Filled 2011-12-25 (×7): qty 2

## 2011-12-25 MED ORDER — SODIUM CHLORIDE 0.9 % IJ SOLN
3.0000 mL | Freq: Two times a day (BID) | INTRAMUSCULAR | Status: DC
  Administered 2011-12-25 – 2011-12-31 (×12): 3 mL via INTRAVENOUS

## 2011-12-25 MED ORDER — OMEGA-3 FATTY ACIDS 1000 MG PO CAPS: 2.0000 g | ORAL_CAPSULE | Freq: Every day | ORAL | Status: DC

## 2011-12-25 MED ORDER — VITAMIN D3 50 MCG (2000 UT) PO CAPS: 2000.0000 [IU] | ORAL_CAPSULE | Freq: Every day | ORAL | Status: DC

## 2011-12-25 MED ORDER — INSULIN GLARGINE 100 UNIT/ML ~~LOC~~ SOLN
25.0000 [IU] | Freq: Every day | SUBCUTANEOUS | Status: DC
  Administered 2011-12-25 – 2011-12-30 (×6): 25 [IU] via SUBCUTANEOUS

## 2011-12-25 MED ORDER — ASPIRIN 81 MG PO CHEW
162.0000 mg | CHEWABLE_TABLET | Freq: Every day | ORAL | Status: DC
Start: 2011-12-25 — End: 2011-12-31
  Administered 2011-12-26 – 2011-12-31 (×6): 162 mg via ORAL
  Filled 2011-12-25 (×3): qty 2
  Filled 2011-12-25: qty 1
  Filled 2011-12-25 (×2): qty 2

## 2011-12-25 MED ORDER — ASPIRIN 81 MG PO TABS: 325.0000 mg | ORAL_TABLET | Freq: Once | ORAL | Status: DC

## 2011-12-25 MED ORDER — NITROGLYCERIN 0.4 MG SL SUBL: 0.4000 mg | SUBLINGUAL_TABLET | SUBLINGUAL | Status: DC | PRN

## 2011-12-25 MED ORDER — FUROSEMIDE 10 MG/ML IJ SOLN
40.0000 mg | Freq: Once | INTRAMUSCULAR | Status: AC
  Administered 2011-12-25: 40 mg via INTRAVENOUS
  Filled 2011-12-25: qty 4

## 2011-12-25 MED ORDER — METHOCARBAMOL 500 MG PO TABS
500.0000 mg | ORAL_TABLET | Freq: Three times a day (TID) | ORAL | Status: DC | PRN
  Administered 2011-12-25: 500 mg via ORAL
  Filled 2011-12-25: qty 1

## 2011-12-25 MED ORDER — SIMVASTATIN 10 MG PO TABS
10.0000 mg | ORAL_TABLET | Freq: Every day | ORAL | Status: DC
  Administered 2011-12-25 – 2011-12-30 (×6): 10 mg via ORAL
  Filled 2011-12-25 (×7): qty 1

## 2011-12-25 NOTE — Consult Note (Signed)
[facility] Medical Consultation  Emma Yu UJW:[id number] DOB: [birth year] DOA: 12/25/2011 PCP: Sanda Linger, MD, MD   Requesting physician: [facility]  Date of consultation: 12/25/10  Reason for consultation: Multiple medical concerns-specifics= Anemia/Diabetes  Chief Complaint: Dyspnoea  HPI:  Pleasant 78 year old female who was recently discharged from hospital awoke this am at  [facility] and was about to get oob and use the bedroom and "couldn't hardly talk"-was sent over here she states she was "weak"  Endorses SOB, but no CP at that time.  Admits to having some SOB over last couple of days-about 2 days ago.  Did state about 2 days ago she had a "hurt" in the middle of her chest-and thoguht this was similar in the reflux pain she states she has had weakness overall over past couple of days, but denies ha/other cp with radiation, no blurred or double vision, no n/v, but is hungry and thirsty.  Did develop some swellign of stomach over past 2 days .  Developed SOB with minimal usual activty like moving to the bedpan at side of her bed, no LE swelling, and has needed to use more pillows than usual. Patient also states she's not sure if she's been given her diuretics at the nursing home, as she hasn't been using the restroom in passing as much urine and she is use to as she has at home   States she has noticed dark stools in the past, but has not noticed this ever before.  Not on iron, but is on multivitamins.  No h/o per patient of any GIB.   States she has had a colonoscopy in 1978 done by  Dr. Laruth Bouchard in [place] a R hemicolectomy with had 17 inches of R colon taken out as well by Dr. Mariel Sleet was maybe in 2002 in [place]  In the emergency room her potassium was noted to 5.3 BUN and creatinine 26/1.03 proBNP 2181 troponin point-of-care 0.02 Noted that her baseline creatinine is anywhere from 1.0-2.2. Hemoglobin 8.2. Baseline  appears to be about 10.1 it has been this high in the past 08/2011 at 12.2 INR 1.18 Chest x-ray = increased CHF with associated bibasilar effusions and atelectasis   Review of Systems:  Positive for some shortness of breath, nonspecific chest pain with no radiation. No headache no blurred vision no double vision no nausea no vomiting no vomiting of blood. Stools have been darker than usual the patient is on multivitamins. No lower extremity edema-- no itching no burning of skin. No dysuria no noted fever. Please see above  Past medical h/o: Diabetic since 1999-Insulin pump placed about 2006 H/o PAD with stents in R & L common iliac arterties 2002, Dr. Beverely Pace in 04/2009 angiographic evidence L renal disease+distal AA disease and severe proximal and superifical R femoral disease-Persistent LE edema 12/2009 with ABI 0.55 R and 0.75 L side Resistant Htn-elevated renin levels and though a need to suppress this per Cards LL edema 06/2009 thought to be CHF>cellulitis>DVT (had neg dopplers) Intolerant to Carevediolol 2/2 to pauses Seen by Dr. Everardo All 07/2009 for DM-noted peripheral neuropathy-started Gabapentin-noted on subsequent visits 06/2011 to be over Rx her insulin with lows which was reaffirmed on his 10/22/11  Where her A1c was 6.0note  Est care Dr. Kern Alberta hypoglycemia when seen Revision R total hip 10/2010-periop had ? Mild ischemia on stress perfusion study-at that admit developed R afib unstable + Cardioverted (some ? Of Diltiazem intolerance?) Junctional bradycardia 3/17 admit + AKI 2/2  to diuretics Cath 12/2010=minimal irregularities c no sig CAd.  Mod systemic Htn + mild elevated LVEDP  Last admit 4/20=Fall, head injury stable with Ct head neg here and p Afib-started B blocker-no coumadin-pulm edema at attempt to volume replete-Echocardiogram of 12/10/11, showed moderate LVH, EF 65-70%, and moderate aortic stenosis.(see cardiology notes) Because of bilateral carotid bruits, Carotid  ultrasound was done on 12/17/11, showed 60-79% right ICA stenosis and 50-59% left ICA stenosis. Vascular surgeon, saw 12/17/11, and he has recommended outpatient follow up in his office, with serial ultrasound. Had AKi Bun 83/creat 3.06--down to 0.97 on d/c back home [facility] r/xd D/c to [facility] off of insulin pump as SNF couldn't manage this  Past Surgical History  Procedure Date  . Total hip arthroplasty 1991, 1994    redo in 1994  . Lumbar disc surgery 1999  . Cardiac catheterization 01/10/2011    No significant CAD   Social History:  reports that she has never smoked. She has never used smokeless tobacco. She reports that she does not drink alcohol or use illicit drugs.  Allergies  Allergen Reactions  . Carvedilol Other (See Comments)    Heart stops  . Diltiazem Hcl Other (See Comments)    Chest pain  . Fenofibrate Other (See Comments)    Unknown - NH - MAR  . Hctz (Hydrochlorothiazide) Other (See Comments)    Chest pain  . Nisoldipine Other (See Comments)    Chest pain  . Nitroglycerin Other (See Comments)    Unknown - NH MAR  . Propranolol Diarrhea    Chest pain  . Tekturna (Aliskiren Fumarate) Other (See Comments)    Unknown reaction  . Valturna (Aliskiren-Valsartan) Other (See Comments)    Unknown reaction   Family History  Problem Relation Age of Onset  . Diabetes Father   . Diabetes Other     5/8 sibs    Prior to Admission medications   Medication Sig Start Date End Date Taking? Authorizing Provider  acetaminophen (TYLENOL) 325 MG tablet Take 325 mg by mouth daily as needed. for pain.   Yes Historical Provider, MD  Ascorbic Acid (VITAMIN C) 500 MG tablet Take 500 mg by mouth daily.     Yes Historical Provider, MD  aspirin 81 MG chewable tablet Chew 2 tablets (162 mg total) by mouth daily. 12/18/11 12/17/12 Yes Laveda Norman, MD  b complex vitamins tablet Take 1 tablet by mouth daily.     Yes Historical Provider, MD  Cholecalciferol (VITAMIN D3) 2000 UNITS capsule Take  2,000 Units by mouth daily.     Yes Historical Provider, MD  Cinnamon 500 MG capsule Take 2,000 mg by mouth daily.    Yes Historical Provider, MD  cloNIDine (CATAPRES) 0.1 MG tablet Take 1 tablet (0.1 mg total) by mouth 2 (two) times daily. 12/18/11 12/17/12 Yes Laveda Norman, MD  Coenzyme Q10 (CO Q 10) 100 MG CAPS Take 300 mg by mouth daily.   Yes Historical Provider, MD  diclofenac sodium (VOLTAREN) 1 % GEL Apply 1 application topically 4 (four) times daily as needed. For pain 10/31/11  Yes Etta Grandchild, MD  diltiazem (CARDIZEM CD) 240 MG 24 hr capsule Take 1 capsule (240 mg total) by mouth daily. 12/18/11 12/17/12 Yes Laveda Norman, MD  fish oil-omega-3 fatty acids 1000 MG capsule Take 2 g by mouth daily.     Yes Historical Provider, MD  gabapentin (NEURONTIN) 300 MG capsule Take 600 mg by mouth at bedtime. 10/31/11  Yes Maisie Fus  Karsten Ro, MD  insulin aspart (NOVOLOG) 100 UNIT/ML injection Inject 7 Units into the skin 3 (three) times daily after meals. 12/18/11 12/17/12 Yes Laveda Norman, MD  insulin glargine (LANTUS) 100 UNIT/ML injection Inject 25 Units into the skin at bedtime. 12/18/11 12/17/12 Yes Laveda Norman, MD  methocarbamol (ROBAXIN) 500 MG tablet Take 500 mg by mouth 3 (three) times daily as needed. For muscle spasms 04/19/11  Yes Newt Lukes, MD  metoprolol tartrate (LOPRESSOR) 25 MG tablet Take 1 tablet (25 mg total) by mouth 2 (two) times daily. 12/18/11 12/17/12 Yes Laveda Norman, MD  omeprazole (PRILOSEC) 40 MG capsule Take 40 mg by mouth 2 (two) times daily.  10/31/11  Yes Etta Grandchild, MD  pravastatin (PRAVACHOL) 20 MG tablet Take 20 mg by mouth every evening. 10/31/11 10/30/12 Yes Etta Grandchild, MD  traMADol (ULTRAM) 50 MG tablet Take 1 tablet (50 mg total) by mouth every 6 (six) hours as needed. For pain 12/18/11  Yes Laveda Norman, MD   Physical Exam: Blood pressure 111/49, pulse 71, temperature 97.6 F (36.4 C), temperature source Oral, resp. rate 18, SpO2 92.00%. Filed Vitals:   12/25/11  0930 12/25/11 0931 12/25/11 1001 12/25/11 1223  BP: 126/59 121/44 116/66 111/49  Pulse: 77 59 70 71  Temp: 97.6 F (36.4 C)     TempSrc: Oral     Resp: 18 23 22 18   SpO2: 88% 100% 94% 92%     General:  Frail pleasant Caucasian female. Oriented to person place time. No pallor or icterus. Dentures noted. Some mild JVD noted. No bruit. Has a resting tremor of mouth and right upper extremity  Eyes: Extraocular movements intact  ENT: See above  Neck: See above  Cardiovascular: S1-S2 tachycardic. Telemetry = atrophic relation  Respiratory: Decreased breath sounds right lower lung fields. Scattered rales left lower lung fields.  Abdomen: Distended midline abdominal scar from umbilicus to symphysis pubis. No rebound or guarding. Guaiac stool done with no noted blood on glove. Anal sphincter max  Skin: No lower extremity edema. Multiple seborrheic keratoses over her back. Noted scar in the anal cleft.  Musculoskeletal: No limitation of musculature  Psychiatric: Anxious.  Neurologic: No tremor. Moving all 4 limbs equally. Sensation seems bilaterally intact. Grossly normal. Cannot assess fundus  Labs on Admission:  Basic Metabolic Panel:  Lab 12/25/11 0454  NA 139  K 5.3*  CL 106  CO2 25  GLUCOSE 182*  BUN 26*  CREATININE 1.03  CALCIUM 9.7  MG --  PHOS --   Liver Function Tests: No results found for this basename: AST:5,ALT:5,ALKPHOS:5,BILITOT:5,PROT:5,ALBUMIN:5 in the last 168 hours No results found for this basename: LIPASE:5,AMYLASE:5 in the last 168 hours No results found for this basename: AMMONIA:5 in the last 168 hours CBC:  Lab 12/25/11 0953  WBC 9.7  NEUTROABS 8.0*  HGB 8.2*  HCT 26.5*  MCV 92.7  PLT 281   Cardiac Enzymes: No results found for this basename: CKTOTAL:5,CKMB:5,CKMBINDEX:5,TROPONINI:5 in the last 168 hours BNP: No components found with this basename: POCBNP:5 CBG: No results found for this basename: GLUCAP:5 in the last 168  hours  Radiological Exams on Admission: Dg Chest Portable 1 View  12/25/2011  *RADIOLOGY REPORT*  Clinical Data: Shortness of breath, fever, heart attack in 2012, hypertension, diabetes, history CHF  PORTABLE CHEST - 1 VIEW  Comparison: Portable exam 0955 hours compared to 12/12/2011  Findings: Enlargement of cardiac silhouette with pulmonary vascular congestion. Perihilar to basilar interstitial infiltrates likely  pulmonary edema and CHF increased since previous exam. Increased bibasilar effusions and minimal atelectasis. Dense atherosclerotic calcification aortic arch. No pneumothorax. Bones demineralized.  IMPRESSION: Increased CHF with associated bibasilar effusions and atelectasis.  Original Report Authenticated By: Lollie Marrow, M.D.    EKG: Independently reviewed. Atrial Fibrillation, no periods noted irregularly irregular with some flutter waves and possible bigeminy. QRS axis -60 chronic ST segment elevation in lead V3. No specific change from prior EKG 4/24  Impression/Recommendations 1. Anemia-her guaiac stool x1 here is negative. Would recommend serial studies (Hemoccult) and possible discussion with her son who I tried to contact today but was not able to with regards to colonoscopy. She appears to initially have some history of hemicolectomy for multiple polyps and even in a healthy 78 year old she may be at intermediate risk for sedation and perioperative/Procedural morbidity-certainly this could contribute to her paroxysmal nature fibrillation, or worsening CHF given lack of oxygen tank pasty and she might benefit from being placed on 3 times a day and versus iron infusion versus just leaving her alone completely. Getting iron studies on her is reasonable however her MCV denotes that she is not really microcytic-will add a reticulocyte count to this to see if she's actually producing blood as she did have a fall in her anemia is multifactorial in that she lost blood when she had that  fall 2. Diabetes mellitus type 2 with prior insulin pump-would recommend following her current CLAPPS regimen.  She'll likely would not benefit from insulin pump placement as there has been a tendency in her case (per Dr. Rosary Lively numerous office notes)  to try to overcontrol this. There is associated mortality with an A1c below 7.0 with patient's over the age of 47. 3. Chest pain plus acute worsening of of this possible secondary to aortic valve stenosis. From a cardiac enzymes are pending, however her EKG is unchanged from prior --Her valve area is 3.05 and once again she is not a clear surgical candidate.  [name] currently controlled on a beta blocker --this is a relative contraindication in severe AS-she has been discontinued off of her diltiazem. Ultimately the goals of her care need to be discussed with her and with her family as with multiple back-to-back admissions, she has clear risk for frequent decompensation. While I do agree that Lasix would help her in the interim,the greater question is what would be the best thing to get her out of the state that she is in-which should be someone that we could do a transcatheter AoV implantation?  Her echo 4/23 mentions vigorous systolic function and no comment on Doppler parameters although she does have LVH 4. Paroxysmal atrial fibrillation on Coumadin-Chad score=4-has been cardioverted x 1 and (?)trialed on Amio in the past.  Not a Coumadin candidate. Continue aspirin-per cardiologist 5. Prior acute kidney injury from Lasix-careful management of fluid status given multiple reasons for exacerbations of heart status 6. Severe peripheral vascular disease-continue home medications-to see Dr.Chen as outpatient if possible.  Continue Gabapentin 7. History of possible colectomy with polyps in 2002, no record in chart. 8. Hypertension (high renin variety)-metoprolol 25 mg at SNF.  Would hold for now as decompensated-per Carfdiologist discretion.  Would place  parameters for hydalazine.  Would avoid Clonidine PO, or taper slowly, as can cause rebound tachycardia 9. HLD-continue Pravachol-rec stop checking Lipid panels/recommend discontinue CoQ if for myopathy and discussion with family re: statins  Hospitalist to follow again tomorrow.  Overall I think frank discussion from a  cardiac standpoint about multiple concerns and re-admissions is necessary.  Palliative medicine might be able to help out when she stabilizes to discuss her GOC Please contact me if I can be of assistance in the meanwhile. Thank you for this consultation.  Pleas Koch, MD  [facility] Pager [phone]  If 7PM-7AM, please contact night-coverage [email or web address] Password [facility] 12/25/2011, 2:27 PM

## 2011-12-25 NOTE — ED Notes (Signed)
The V/S that were documented at 09:31 were done at 09:05. I didn't put them in until I was done with the EKG and all the other things I had to do for the patient.

## 2011-12-25 NOTE — Progress Notes (Signed)
Detailed discussion with the son about mother's care. Mentioned to him our role in her care being to help determine if her anemia and diabetes are in need of workup and other issues. Mentioned progress to date. Patient seems to have a living will but he does not know the contents of the same but will bring this with him. Patient is son states that he is healthcare power of attorney and if there was a chance for patient to survive in case of cardiac/respiratory failure, she would've wanted everything done from his last recollection. He does confirm the patient has had colectomy in the past. I mentioned to him that we'll see how patient does and that we can access placed heart failure patient may need specialist care other than cardiologist-patient might ultimately need discussion of palliative medicine-this was only broached on the peripheries and not explicitly stated as patient has just been admitted. Hospitalist will continue to follow for multiple other medical issues. Pleas Koch, MD [facility] [phone]

## 2011-12-25 NOTE — ED Notes (Signed)
Pt presents to department for evaluation of SOB and chest pain. States she has been experiencing chest tightness x2 nights, became worse this morning while getting dressed. Also states difficulty breathing became worse this morning. Speaking short sentences upon arrival, breathing labored. Skin cool and clammy. Denies chest pain at the time. [name] alert and oriented x4.

## 2011-12-25 NOTE — ED Notes (Signed)
Pt now states chest tightness, resident at bedside.

## 2011-12-25 NOTE — Progress Notes (Addendum)
Inpatient Diabetes Program Recommendations   Reason for Visit: Consult: Previously on insulin pump     Patient well known to our team.  Just discharged last month from hospital to [facility].  Patient came into hospital last admission on insulin pump which was discontinued due to altered mental status and never resumed upon discharge.     Insulin pump settings:  0.9 units/hour =  21.6 units daily                                                   16 units bolus with each meal     Last HgbA1c in January was 6.0% which is great control, however patient states that her glucose has been uncontrolled since taken off insulin pump.  Current insulin order are: Lantus 25 units qhs and Novolog 7 units TID     Recommendations:         May be appropriate to resume insulin pump at settings per Dr. Everardo All as stated previously if patient able to manage pump by herself.  She must be able to start insulin pump by herself and appropriately bolus self as needed.  Also family must bring in insulin pump and supplies from home.  Our pharmacy can supply Novolog insulin for pump, if needed.  However, past visits she has not been able to appropriately manage insulin pump by self while in the inpatient setting.  Concern whether she will be able to manage as outpatient.       If patient deemed inappropriate to resume insulin pump, consider:        Lantus 25 units qhs         Novolog 10 units TID with meals while inpatient.                     (post-prandial glucose 170-230 last admission with Novolog 7 units TID)        Sensitive Novolog Correction TID         CHO modified medium  Note: Only a few SNFs accept insulin pumps.  Please consult Social Work if decide to place back on insulin pump and d/c back to SNF.     Thank you.      Alfredia Client, PhD, RN, MSN   Diabetes Coordinator   [phone]

## 2011-12-25 NOTE — H&P (Signed)
History and Physical  Patient ID: Emma Yu MRN: [id number], SOB: [birth year] 78 y.o. Date of Encounter: 12/25/2011, 12:33 PM  Primary Physician: Sanda Linger, MD, MD Primary Cardiologist: Charlton Haws MD  Chief Complaint: Dyspnea  HPI: 78 y.o. female w/ PMHx significant for PVD, mod AS, HTN, HLD, Diastolic CHF, PAF (s/p DCCV 10/2010), no significant CAD by cath 12/2010 who presented to [facility] on 12/25/2011 with complaints of dyspnea.  Last echo 11/2011 showed moderate LVH, EF 65-70%, moderate AS, valve area 3.05cm^2 (Vmax), mild MS. Last cardiac cath 12/2010 revealed no significant CAD, mildly elevated LVEDP. Carotid ultrasound 12/17/11, showed 60-79% right ICA stenosis and 50-59% left ICA stenosis. She developed A.Fib w/ RVR after hip surgery in march of last year requiring DCCV with conversion to NSR.  Patient is a poor historian. According to previous notes she fell out of bed on 12/08/11, and hit her head resulting in laceration needing sutures at [facility], following which she was discharged home, only to develop diarrhea and weakness. She was then brought to [facility] ED, where she was found to be anemic and dehydrated with acute renal failure requiring admission. CT head was without acute findings. She went into A.Fib w/ RVR requiring cardizem and BB for rate control (she was discharged in A.Fib). No anticoagulation 2/2 head trauma and falls. She was initially dehydrated with ARF requiring hydration and discontinuation of HCTZ, Lasix, and ARB. She then developed pulmonary edema due to overhydration. She was treated for a UTI. She had normocytic anemia w/ Hgb 9.2 felt due to acute blood loss in the setting of chronic anemia. She was discharged to [facility]. (Note diuretics were not continued at DC nor was her insulin pump.)  [name] unable to give full history of events leading to this hospitalization, but reports feeling sob and fatigued yesterday requiring  supplemental O2. This morning she needed help with ADLs due to dyspnea and was therefore brought to the ED. She also noted epigastric/chest discomfort about 3-4 days ago that has occurred intermittently. She describes it as a tightness that goes to her back. She thinks it is indigestion, as she has felt it before and is usually relieved with "heartburn pill" at home but doesn't think she is getting this at [facility]. She noted her stools have been darker lately, but no bright red blood. Also notes abdominal fullness. Denies worsening of LE edema or orthopnea. Denies chest pain with exertion or calf pain.  In the ED, EKG revealed A.Fib 76bpm with no significant ST changes. CXR shows increased CHF with associated bibasilar effusions and atelectasis. Labs significant for normal poc troponin, BNP 2181, H&H 8.2/26.5, K+ 5.3. She received 40mg  IV lasix and 325 ASA.    Past Medical History  Diagnosis Date  . PAD (peripheral artery disease)     s/p bilateral comon iliac artery stenting in 2002. Known significant  R SFA  disease  . Aortic stenosis     Mild  . Diabetes mellitus   . Hypertension   . Hyperlipidemia   . Venous insufficiency   . CHF (congestive heart failure)     normal EF  . Atrial fib/flutter, transient   . Atrial fibrillation     post op  . DJD (degenerative joint disease) of hip     s/p R THR 10/2010  . CAD (coronary artery disease)     Mild by history  . Aortic stenosis     Moderate by history  . Mitral  stenosis     Mild    12/10/11 - 2D Echocardiogram Study Conclusions:  - Left ventricle: Wall thickness was increased in a pattern of moderate LVH. Systolic function was vigorous. The estimated ejection fraction was in the range of 65% to 70%. - Aortic valve: Moderately calcified annulus. There was moderate stenosis. Valve area: 3.22cm^2(VTI). Valve area: 3.05cm^2 (Vmax). - Mitral valve: The findings are consistent with mild stenosis.  01/10/11 - Cardiac Cath STUDY FINDINGS:  Hemodynamic findings: The aortic pressure is 183/61 with a mean pressure of 106 mmHg. Left ventricular pressure is 174/11 with a left ventricular end-diastolic pressure of 15 mmHg. No significant gradient was noted across the aortic valve.  CORONARY ANGIOGRAPHY:  Left main coronary artery: The vessel is a normal in size and free of significant disease.  Left circumflex artery: The vessel was medium in size and nondominant. It was free of any significant disease with minor irregularities. OM-1 and OM-2 are very small-sized branches. OM-3 is normal in size.  Left anterior descending artery: The vessel was normal in size with minor irregularities and overall free of significant disease. The first  diagonal is medium in size, second diagonal is a large size branch, and third diagonal is very small in size.  Right coronary artery: The vessel is a large size and dominant. It has minor irregularities with no significant disease. The right PDA is  large size branch and free of significant disease.  STUDY CONCLUSIONS: 1. Mild luminal irregularities with no significant coronary artery disease. 2. Moderate systemic hypertension with mildly elevated left ventricular end-diastolic pressure.  Surgical History:  Past Surgical History  Procedure Date  . Total hip arthroplasty 1991, 1994    redo in 1994  . Lumbar disc surgery 1999  . Cardiac catheterization 01/10/2011    No significant CAD     Home Meds: Medication Sig  acetaminophen (TYLENOL) 325 MG tablet Take 325 mg by mouth daily as needed. for pain.  Ascorbic Acid (VITAMIN C) 500 MG tablet Take 500 mg by mouth daily.    aspirin 81 MG chewable tablet Chew 2 tablets (162 mg total) by mouth daily.  b complex vitamins tablet Take 1 tablet by mouth daily.    Cholecalciferol (VITAMIN D3) 2000 UNITS capsule Take 2,000 Units by mouth daily.    Cinnamon 500 MG capsule Take 2,000 mg by mouth daily.   cloNIDine (CATAPRES) 0.1 MG tablet Take 1 tablet (0.1 mg  total) by mouth 2 (two) times daily.  Coenzyme Q10 (CO Q 10) 100 MG CAPS Take 300 mg by mouth daily.  diclofenac sodium (VOLTAREN) 1 % GEL Apply 1 application topically 4 (four) times daily as needed. For pain  diltiazem (CARDIZEM CD) 240 MG 24 hr capsule Take 1 capsule (240 mg total) by mouth daily.  fish oil-omega-3 fatty acids 1000 MG capsule Take 2 g by mouth daily.    gabapentin (NEURONTIN) 300 MG capsule Take 600 mg by mouth at bedtime.  insulin aspart (NOVOLOG) 100 UNIT/ML injection Inject 7 Units into the skin 3 (three) times daily after meals.  insulin glargine (LANTUS) 100 UNIT/ML injection Inject 25 Units into the skin at bedtime.  methocarbamol (ROBAXIN) 500 MG tablet Take 500 mg by mouth 3 (three) times daily as needed. For muscle spasms  metoprolol tartrate (LOPRESSOR) 25 MG tablet Take 1 tablet (25 mg total) by mouth 2 (two) times daily.  omeprazole (PRILOSEC) 40 MG capsule Take 40 mg by mouth 2 (two) times daily.   pravastatin (PRAVACHOL) 20 MG  tablet Take 20 mg by mouth every evening.  traMADol (ULTRAM) 50 MG tablet Take 1 tablet (50 mg total) by mouth every 6 (six) hours as needed. For pain   Allergies:  Allergies  Allergen Reactions  . Carvedilol Other (See Comments)    Heart stops  . Diltiazem Hcl Other (See Comments)    Chest pain  . Fenofibrate Other (See Comments)    Unknown - NH - MAR  . Hctz (Hydrochlorothiazide) Other (See Comments)    Chest pain  . Nisoldipine Other (See Comments)    Chest pain  . Nitroglycerin Other (See Comments)    Unknown - NH MAR  . Propranolol Diarrhea    Chest pain  . Tekturna (Aliskiren Fumarate) Other (See Comments)    Unknown reaction  . Valturna (Aliskiren-Valsartan) Other (See Comments)    Unknown reaction   Social History  . Marital Status: Widowed   Occupational History    retired   Social History Main Topics  . Smoking status: Quit smoking in 1987  . Smokeless tobacco: Never Used  . Alcohol Use: No  . Drug Use:  No  . Sexually Active: Not Currently    Social History Narrative   Currently lives at [facility] for rehab, previously lived with son; Widowed 1991      Family History  Problem Relation Age of Onset  . Diabetes Father   . Diabetes Other     5/8 sibs    Review of Systems: General: negative for chills, fever, night sweats or weight changes.  Cardiovascular: As per HPI Dermatological: negative for rash Respiratory: negative for cough or wheezing Urologic: negative for hematuria Abdominal: (+) dark stools; negative for nausea, vomiting, diarrhea, bright red blood per rectum, or hematemesis Neurologic: negative for visual changes, syncope, or dizziness All other systems reviewed and are otherwise negative except as noted above.  Labs:   Component Value Date   WBC 9.7 12/25/2011   HGB 8.2* 12/25/2011   HCT 26.5* 12/25/2011   MCV 92.7 12/25/2011   PLT 281 12/25/2011    Lab 12/25/11 0953  NA 139  K 5.3*  CL 106  CO2 25  BUN 26*  CREATININE 1.03  CALCIUM 9.7  GLUCOSE 182*     12/25/2011 09:54  Pro B Natriuretic peptide (BNP) 2181.0 (H)     12/25/2011 10:08  Troponin i, poc 0.02     Radiology/Studies:   12/25/2011 - CXR Findings: Enlargement of cardiac silhouette with pulmonary vascular congestion. Perihilar to basilar interstitial infiltrates likely pulmonary edema and CHF increased since previous exam. Increased bibasilar effusions and minimal atelectasis. Dense atherosclerotic calcification aortic arch. No pneumothorax. Bones demineralized.  IMPRESSION: Increased CHF with associated bibasilar effusions and atelectasis.    EKG: 12/25/11 @ 0914 - Atrial Fibrillation, 76bpm, no acute ST/T changes  Physical Exam: Blood pressure 111/49, pulse 71, temperature 97.6 F (36.4 C), temperature source Oral, resp. rate 18, SpO2 92.00%. General: Pleasant elderly white female in no acute distress. Head: Normocephalic, atraumatic, sclera non-icteric, nares are without  discharge Neck: Supple. Bilat carotid bruits, although difficult to differentiate from AS murmur radiation. JVD elevated. Lungs: Bilat rales to mid lung fields, No wheezes rales, or rhonchi. Breathing is unlabored. Heart: Irregularly irregular with S1 S2. SEM No rubs or gallops appreciated. Abdomen: Soft, non-tender, non-distended with normoactive bowel sounds. No rebound/guarding. No obvious abdominal masses. Msk:  Strength and tone appear normal for age. Extremities: Trace - 1+ BLE. Chronic venous stasis skin changes BLE L>R. No clubbing  or cyanosis. Distal pedal pulses are intact and equal bilaterally. Neuro: Alert and oriented X 3. Moves all extremities spontaneously. Psych:  Responds to questions appropriately with a normal affect.    ASSESSMENT AND PLAN:  78 y.o. female w/ PMHx significant for PVD, mod AS, HTN, HLD, Diastolic CHF, PAF (s/p DCCV 10/2010), no significant CAD by cath 12/2010 who presented to [facility] on 12/25/2011 with complaints of dyspnea.  1. Acute on Chronic Diastolic CHF: She was recently hospitalized during which time she developed pulm edema from overhydration. She was not discharged with a diuretic. She now presents with dyspnea, elevated BNP and CXR showing increased CHF. Recent echo showed EF 65-70%. She will need admission for diuresis. Start with 20mg  IV Lasix BID. Stop Cardizem. Follow electrolytes and renal function closely. Daily weights and strict I/Os.  2. Atrial Fibrillation: Developed postop a.fib last year requiring DCCV with conversion to NSR. Had A.Fib w/ RVR during hospitalization last month and was rate controlled with BB and cardizem. [name] currently in A.Fib and rate controlled. Due to CHF exacerbation will stop cardizem, cont BB and monitor on telemetry. [name] not a good candidate for anticoagulation due to falls. Cont ASA (will stop if anemia work up reveals acute bleed).  3. Chest Pain: No significant CAD by cath one year ago. Echo last  month without RWMAs and nl EF. EKG without acute ischemic changes. poc troponin normal. Atypical chest pain in the setting of pulmonary edema and anemia. Do not suspect ACS.   4. Hyperkalemia: K+ 5.3. Lasix as above. BMET in the am  5. Anemia: Normocytic anemia, Hgb 8.2. Was 9.1 at discharge last month, previously normal before that hospitalization. She reports dark stools and has ongoing anemia.  Check anemia panel, guaiac stools, cont PPI and consult Internal Medicine.  6. Diabetes Mellitus: Her insulin pump was not continued upon discharge to [facility] nursing home. She has been receiving Lantus and SSI and reports elevated blood sugars. Glucose 182 on lab. Will cont SSI and Lantus and consult Internal medicine for further diabetes management (please arrange for resumption of insulin pump if appropriate).  Signed, HOPE, JESSICA PA-C 12/25/2011, 12:33 PM  Patient examined and chart reviewed.  Familiar with patient from recent admission.  Diastolic dysfunction with moderate AS and loss of atrial kick due to PAF.  She had a bad laceration to her scalp last admission and is not a coumadin candidate due to anemia and frequent falls.  Will try to  Diurese and follow Cr.  Will ask hospitalist to assist in w/u of anemia and diabetes.  The patient normally is on an insulin pump which was stopped by hospitalist and she was sent to SNF where BS being covered with short acting insulin withmeals and lantus.  She indicates that Dr Everardo All had her on .9units/min on pump and 16U with meals.  Fairly recent cath with no CAD  Charlton Haws 2:46 PM

## 2011-12-25 NOTE — ED Provider Notes (Signed)
I saw and evaluated the patient, reviewed the resident's note and I agree with the findings and plan. Patient has been having difficulty with her breathing. She has a complex medical history with multiple medical problems including diabetes mellitus, chronic atrial fibrillation and congestive heart failure.  On exam the patient is in no distress however she does have a oxygen saturation in the 90s on nasal cannula oxygen.  on lung exam I do not hear any significant crackles.  We'll proceed with evaluation to assess for cardiac and/or  pulmonary source of her dyspnea. Labs have been ordered as well EKG and chest x-ray.  Celene Kras, MD 12/25/11 1018

## 2011-12-25 NOTE — ED Provider Notes (Signed)
History     CSN: [id number]  Arrival date & time 12/25/11  0905   First MD Initiated Contact with Patient 12/25/11 [phone]      Chief Complaint  Patient presents with  . Shortness of Breath    (Consider location/radiation/quality/duration/timing/severity/associated sxs/prior treatment) HPI history of present illness chief complaint shortness of breath. Patient arrived by EMS. History provided by patient. No language barriers identified. Information not limited. Onset of symptoms this morning. Location of symptoms development: at her nursing home. Symptoms not improved or worsened by anything. Patient denies pain so radiation not pertinent to this chief complaint. Severity shortness breath is mild. Timing constant. Duration several hours. Context patient is unsure if this was a gradual or sudden onset shortness of breath. For associated signs and symptoms please refer to the review of systems. No treatments tried prior to arrival. No recent medical care. Regarding patient's social history please refer to the nurse's notes. Patient does not wear home oxygen. I reviewed the patient's past medical past surgical social history as well as medications and allergies.  Past Medical History  Diagnosis Date  . PAD (peripheral artery disease)     s/p bilateral comon iliac artery stenting in 2002. Known significant  R SFA  disease  . Aortic stenosis     Mild  . Diabetes mellitus   . Hypertension   . Hyperlipidemia   . Venous insufficiency   . CHF (congestive heart failure)     normal EF  . Atrial fib/flutter, transient   . Atrial fibrillation     post op  . DJD (degenerative joint disease) of hip     s/p R THR 10/2010  . CAD (coronary artery disease)     Mild by history  . Aortic stenosis     Moderate by history  . Mitral stenosis     Mild    Past Surgical History  Procedure Date  . Total hip arthroplasty 1991, 1994    redo in 1994  . Lumbar disc surgery 1999  . Cardiac catheterization  01/10/2011    No significant CAD    Family History  Problem Relation Age of Onset  . Diabetes Father   . Diabetes Other     5/8 sibs    History  Substance Use Topics  . Smoking status: Never Smoker   . Smokeless tobacco: Never Used   Comment: Retired-widowed 89, lives with son  . Alcohol Use: No    OB History    Grav Para Term Preterm Abortions TAB SAB Ect Mult Living                  Review of Systems  Constitutional: Negative for fever and chills.  HENT: Negative for trouble swallowing, neck pain and neck stiffness.   Eyes: Negative for pain, discharge and itching.  Respiratory: Positive for chest tightness and shortness of breath. Negative for cough, wheezing and stridor.   Cardiovascular: Negative for chest pain, palpitations and leg swelling.  Gastrointestinal: Negative for nausea, vomiting, abdominal pain, diarrhea, constipation and blood in stool.  Genitourinary: Negative for dysuria, urgency, frequency, hematuria, flank pain, decreased urine volume, difficulty urinating and pelvic pain.  Musculoskeletal: Negative for back pain and joint swelling.  Skin: Negative for rash and wound.  Neurological: Negative for dizziness, tremors, seizures, syncope, facial asymmetry, speech difficulty, weakness, light-headedness, numbness and headaches.  Hematological: Negative for adenopathy. Does not bruise/bleed easily.  Psychiatric/Behavioral: Negative for confusion and decreased concentration.    Allergies  Carvedilol; Diltiazem  hcl; Fenofibrate; Hctz; Nisoldipine; Nitroglycerin; Propranolol; Derl Barrow  Home Medications   Current Outpatient Rx  Name Route Sig Dispense Refill  . ACETAMINOPHEN 325 MG PO TABS Oral Take 325 mg by mouth daily as needed. for pain.    Marland Kitchen VITAMIN C 500 MG PO TABS Oral Take 500 mg by mouth daily.      . ASPIRIN 81 MG PO CHEW Oral Chew 2 tablets (162 mg total) by mouth daily. 100 tablet 0  . B COMPLEX PO TABS Oral Take 1 tablet by mouth  daily.      Marland Kitchen VITAMIN D3 2000 UNITS PO CAPS Oral Take 2,000 Units by mouth daily.      Marland Kitchen CINNAMON 500 MG PO CAPS Oral Take 2,000 mg by mouth daily.     Marland Kitchen CLONIDINE HCL 0.1 MG PO TABS Oral Take 1 tablet (0.1 mg total) by mouth 2 (two) times daily. 60 tablet 0  . CO Q 10 100 MG PO CAPS Oral Take 300 mg by mouth daily.    Marland Kitchen DICLOFENAC SODIUM 1 % TD GEL Topical Apply 1 application topically 4 (four) times daily as needed. For pain    . DILTIAZEM HCL ER COATED BEADS 240 MG PO CP24 Oral Take 1 capsule (240 mg total) by mouth daily. 30 capsule 0  . OMEGA-3 FATTY ACIDS 1000 MG PO CAPS Oral Take 2 g by mouth daily.      Marland Kitchen GABAPENTIN 300 MG PO CAPS Oral Take 600 mg by mouth at bedtime.    . INSULIN ASPART 100 UNIT/ML [place] SOLN Subcutaneous Inject 7 Units into the skin 3 (three) times daily after meals. 1 vial 0  . INSULIN GLARGINE 100 UNIT/ML [place] SOLN Subcutaneous Inject 25 Units into the skin at bedtime. 10 mL 0  . METHOCARBAMOL 500 MG PO TABS Oral Take 500 mg by mouth 3 (three) times daily as needed. For muscle spasms    . METOPROLOL TARTRATE 25 MG PO TABS Oral Take 1 tablet (25 mg total) by mouth 2 (two) times daily. 60 tablet 0  . OMEPRAZOLE 40 MG PO CPDR Oral Take 40 mg by mouth 2 (two) times daily.     Marland Kitchen PRAVASTATIN SODIUM 20 MG PO TABS Oral Take 20 mg by mouth every evening.    Marland Kitchen TRAMADOL HCL 50 MG PO TABS Oral Take 1 tablet (50 mg total) by mouth every 6 (six) hours as needed. For pain 120 tablet 0    BP 121/44  Pulse 59  Temp(Src) 97.6 F (36.4 C) (Oral)  Resp 23  SpO2 100%  Physical Exam  Constitutional: [name] oriented to person, place, and time. She appears well-developed and well-nourished. No distress.  HENT:  Head: Normocephalic and atraumatic.  Eyes: Conjunctivae are normal. Right eye exhibits no discharge. Left eye exhibits no discharge. No scleral icterus.  Neck: Normal range of motion. Neck supple.  Cardiovascular: Normal rate, normal heart sounds, intact distal pulses and normal  pulses.  An irregularly irregular rhythm present.  No murmur heard. Pulmonary/Chest: Effort normal. No accessory muscle usage. Not tachypneic. No respiratory distress. She has no decreased breath sounds. She has no wheezes. She has no rhonchi. She has rales in the left middle field and the left lower field. She exhibits no tenderness.  Abdominal: Soft. Bowel sounds are normal. She exhibits no distension and no mass. There is no tenderness. There is no rebound and no guarding.  Musculoskeletal: Normal range of motion. She exhibits no edema and no tenderness.  Neurological: [name] alert and oriented to person, place, and time.  Skin: Skin is warm and dry. [name] not diaphoretic.  Psychiatric: She has a normal mood and affect.    ED Course  Procedures (including critical care time)  Labs Reviewed  CBC - Abnormal; Notable for the following:    RBC 2.86 (*)    Hemoglobin 8.2 (*)    HCT 26.5 (*)    RDW 16.1 (*)    All other components within normal limits  DIFFERENTIAL - Abnormal; Notable for the following:    Neutrophils Relative 83 (*)    Neutro Abs 8.0 (*)    Lymphocytes Relative 7 (*)    All other components within normal limits  BASIC METABOLIC PANEL - Abnormal; Notable for the following:    Potassium 5.3 (*)    Glucose, Bld 182 (*)    BUN 26 (*)    GFR calc non Af Amer 49 (*)    GFR calc Af Amer 57 (*)    All other components within normal limits  PROTIME-INR - Abnormal; Notable for the following:    Prothrombin Time 15.3 (*)    All other components within normal limits  PRO B NATRIURETIC PEPTIDE - Abnormal; Notable for the following:    Pro B Natriuretic peptide (BNP) 2181.0 (*)    All other components within normal limits  POCT I-STAT TROPONIN I   Dg Chest Portable 1 View  12/25/2011  *RADIOLOGY REPORT*  Clinical Data: Shortness of breath, fever, heart attack in 2012, hypertension, diabetes, history CHF  PORTABLE CHEST - 1 VIEW  Comparison: Portable exam 0955 hours compared  to 12/12/2011  Findings: Enlargement of cardiac silhouette with pulmonary vascular congestion. Perihilar to basilar interstitial infiltrates likely pulmonary edema and CHF increased since previous exam. Increased bibasilar effusions and minimal atelectasis. Dense atherosclerotic calcification aortic arch. No pneumothorax. Bones demineralized.  IMPRESSION: Increased CHF with associated bibasilar effusions and atelectasis.  Original Report Authenticated By: Lollie Marrow, M.D.     1. CHF (congestive heart failure)       Date: 12/25/2011  Rate: 76  Rhythm: atrial fibrillation  QRS Axis: left  Intervals: normal  ST/T Wave abnormalities: nonspecific T wave changes  Conduction Disutrbances:none  Narrative Interpretation:   Old EKG Reviewed: changes noted Rate controlled today.   MDM  Pt is a well-appearing 78 year old Caucasian female past medical history of atrial fibrillation not on anticoagulation as well as coronary artery disease with a nonobstructive cath a year ago and a reported history of congestive heart failure who presents with several hours of shortness of breath and mild chest tightness starting at her nursing home this morning. Vital signs stable. Patient afebrile no acute distress. Patient has a new oxygen requirement requiring 2 L of nasal cannula oxygen to keep her oxygen saturation greater than 90%. Patient does not wear oxygen at her nursing facility. Patient states mild cough recently. EKG shows A. fib but rate controlled. No signs of ischemia. Labs and chest x-ray pending at this time. Aspirin ordered. Chest tightness has resolved so plan to hold on sublingual nitroglycerin.  Chest x-ray shows some mild possible pulmonary edema. BNP elevated. Likely heart failure exacerbation as a cause of today's shortness of breath. Lasix ordered patient admitted to cardiology in stable condition.        Consuello Masse, MD 12/25/11 1540

## 2011-12-25 NOTE — ED Notes (Signed)
Floor nurse unable to take report at the time. Nurse to call back. 

## 2011-12-25 NOTE — ED Notes (Signed)
Pt presents to department via [facility] from [facility] for evaluation of SOB. Pt was getting dressed this morning when she became SOB, becomes worse with movement. Also states chest tightness earlier this morning. Labored respirations upon arrival, speaking short sentences. Denies pain at the time. [name] alert and oriented x4. NRB upon arrival. 20g LAC. V-tach noted on cardiac monitor per EMS before arrival to hospital. History of afib.

## 2011-12-25 NOTE — ED Notes (Signed)
[id number] Ready 

## 2011-12-25 NOTE — Progress Notes (Signed)
PHARMACIST - PHYSICIAN ORDER COMMUNICATION  CONCERNING: P&T Medication Policy on Herbal Medications  DESCRIPTION:  This patient's order for:  Cinnamon and co-q10 has been noted.  This product(s) is classified as an "herbal" or natural product. Due to a lack of definitive safety studies or FDA approval, nonstandard manufacturing practices, plus the potential risk of unknown drug-drug interactions while on inpatient medications, the Pharmacy and Therapeutics Committee does not permit the use of "herbal" or natural products of this type within [facility].   ACTION TAKEN: The pharmacy department is unable to verify this order at this time and your patient has been informed of this safety policy. Please reevaluate patient's clinical condition at discharge and address if the herbal or natural product(s) should be resumed at that time.

## 2011-12-26 DIAGNOSIS — E1165 Type 2 diabetes mellitus with hyperglycemia: Secondary | ICD-10-CM

## 2011-12-26 DIAGNOSIS — I509 Heart failure, unspecified: Secondary | ICD-10-CM

## 2011-12-26 DIAGNOSIS — E118 Type 2 diabetes mellitus with unspecified complications: Secondary | ICD-10-CM

## 2011-12-26 DIAGNOSIS — R112 Nausea with vomiting, unspecified: Secondary | ICD-10-CM

## 2011-12-26 LAB — GLUCOSE, CAPILLARY
Glucose-Capillary: 155 mg/dL — ABNORMAL HIGH (ref 70–99)
Glucose-Capillary: 194 mg/dL — ABNORMAL HIGH (ref 70–99)

## 2011-12-26 LAB — CBC
Hemoglobin: 9 g/dL — ABNORMAL LOW (ref 12.0–15.0)
MCH: 28.7 pg (ref 26.0–34.0)
Platelets: 309 10*3/uL (ref 150–400)
RBC: 3.14 MIL/uL — ABNORMAL LOW (ref 3.87–5.11)
WBC: 11.1 10*3/uL — ABNORMAL HIGH (ref 4.0–10.5)

## 2011-12-26 LAB — BASIC METABOLIC PANEL
Calcium: 10.1 mg/dL (ref 8.4–10.5)
GFR calc Af Amer: 48 mL/min — ABNORMAL LOW (ref >90)
GFR calc non Af Amer: 42 mL/min — ABNORMAL LOW (ref >90)
Potassium: 4.3 mEq/L (ref 3.5–5.1)
Sodium: 138 mEq/L (ref 135–145)

## 2011-12-26 MED ORDER — METOPROLOL TARTRATE 1 MG/ML IV SOLN: 5.0000 mg | INTRAVENOUS | Status: AC

## 2011-12-26 MED ORDER — FERROUS SULFATE 325 (65 FE) MG PO TABS
325.0000 mg | ORAL_TABLET | Freq: Three times a day (TID) | ORAL | Status: DC
  Administered 2011-12-27 – 2011-12-31 (×14): 325 mg via ORAL
  Filled 2011-12-26 (×16): qty 1

## 2011-12-26 MED ORDER — METOPROLOL TARTRATE 1 MG/ML IV SOLN
2.5000 mg | INTRAVENOUS | Status: DC | PRN
  Administered 2011-12-27 (×3): 2.5 mg via INTRAVENOUS
  Filled 2011-12-26 (×3): qty 5

## 2011-12-26 MED ORDER — METOPROLOL TARTRATE 1 MG/ML IV SOLN
INTRAVENOUS | Status: AC
  Administered 2011-12-26: 5 mg
  Filled 2011-12-26: qty 5

## 2011-12-26 NOTE — Progress Notes (Signed)
Subjective: Patient seen and examined today. informs her breathing to be better.   Objective:  Vital signs in last 24 hours:  Filed Vitals:   12/26/11 0548 12/26/11 1145 12/26/11 1146 12/26/11 1414  BP: 154/54 133/78 133/78 136/62  Pulse: 99   96  Temp: 98.5 F (36.9 C)   99 F (37.2 C)  TempSrc: Oral   Oral  Resp:    20  Height:      Weight: 65.1 kg (143 lb 8.3 oz)     SpO2: 95%   97%    Intake/Output from previous day:   Intake/Output Summary (Last 24 hours) at 12/26/11 1712 Last data filed at 12/26/11 1346  Gross per 24 hour  Intake   1078 ml  Output   1551 ml  Net   -473 ml    Physical Exam:  General:elderly female in no acute distress. HEENT: no pallor, no icterus, moist oral mucosa, no JVD, no lymphadenopathy Heart: Normal  s1 &s2  Regular rate and rhythm, systolic murnur no rubs, gallops. Lungs: bibasilar crackles Abdomen: Soft, nontender, nondistended, positive bowel sounds. Extremities: No clubbing cyanosis or edema with positive pedal pulses. Neuro: Alert, awake, oriented x3, nonfocal.   Lab Results:  Basic Metabolic Panel:    Component Value Date/Time   NA 138 12/26/2011 0554   K 4.3 12/26/2011 0554   CL 101 12/26/2011 0554   CO2 27 12/26/2011 0554   BUN 31* 12/26/2011 0554   CREATININE 1.18* 12/26/2011 0554   GLUCOSE 155* 12/26/2011 0554   CALCIUM 10.1 12/26/2011 0554   CBC:    Component Value Date/Time   WBC 11.1* 12/26/2011 0554   HGB 9.0* 12/26/2011 0554   HCT 28.9* 12/26/2011 0554   PLT 309 12/26/2011 0554   MCV 92.0 12/26/2011 0554   NEUTROABS 8.0* 12/25/2011 0953   LYMPHSABS 0.7 12/25/2011 0953   MONOABS 0.9 12/25/2011 0953   EOSABS 0.1 12/25/2011 0953   BASOSABS 0.0 12/25/2011 0953    No results found for this or any previous visit (from the past 240 hour(s)).  Studies/Results: Dg Chest Portable 1 View  12/25/2011  *RADIOLOGY REPORT*  Clinical Data: Shortness of breath, fever, heart attack in 2012, hypertension, diabetes, history CHF  PORTABLE CHEST - 1 VIEW   Comparison: Portable exam 0955 hours compared to 12/12/2011  Findings: Enlargement of cardiac silhouette with pulmonary vascular congestion. Perihilar to basilar interstitial infiltrates likely pulmonary edema and CHF increased since previous exam. Increased bibasilar effusions and minimal atelectasis. Dense atherosclerotic calcification aortic arch. No pneumothorax. Bones demineralized.  IMPRESSION: Increased CHF with associated bibasilar effusions and atelectasis.  Original Report Authenticated By: Lollie Marrow, M.D.    Medications: Scheduled Meds:   . aspirin  162 mg Oral Daily  . B-complex with vitamin C  1 tablet Oral Daily  . cholecalciferol  2,000 Units Oral Daily  . cloNIDine  0.1 mg Oral BID  . furosemide  20 mg Intravenous BID  . gabapentin  600 mg Oral QHS  . insulin aspart  0-15 Units Subcutaneous TID WC  . insulin glargine  25 Units Subcutaneous QHS  . metoprolol tartrate  25 mg Oral BID  . omega-3 acid ethyl esters  1 g Oral Daily  . pantoprazole  40 mg Oral Q1200  . simvastatin  10 mg Oral q1800  . sodium chloride  3 mL Intravenous Q12H  . vitamin C  500 mg Oral Daily  . DISCONTD: fish oil-omega-3 fatty acids  2 g Oral Daily   Continuous  Infusions:  PRN Meds:.sodium chloride, acetaminophen, diclofenac sodium, methocarbamol, ondansetron (ZOFRAN) IV, sodium chloride, traMADol, zolpidem, DISCONTD: nitroGLYCERIN  Assessment/Plan:  78 y/o female with hx of DM, peripheral neuropathy, paroxysmal afib, moderate AS, bilateral non critical ICA stenosis was admitted with SOB, fatigue and dehydration. Medical consult following her  regarding her DM and anemia.   Anemia: Stool for occult blood negative She informs having colonoscopy about 7-8 years back. Iron panel suggests iron deficiency anemia. Hb unchanged from recent labs B12 , TSH wnl Will start on iron sulfate  Diabetes mellitus type 2 Continue lantus at current dose and SSI Wanted to go back on her insulin pump, but  as per office notes from her PCP she would not benefit from being on a pump due to tendency to overcorrect. Last A1C on only 6   Chest pain  likely secondary to her AS. No a surgical candidate Currently on BB, off cardizem High risk for decompression  CHF  associated diastolic dysn with AS Recent echo with good LV fn Cont diuresis  Afib Rate controlled on BB. Not a coumadin candidate due to fall risk and anemia Cont tele monitoring   severe PVD Follows Dr Imogene Burn as outpt Cont neurontin  HTN  stable Cont metoprolol and lasix   Earlier hospitalist has spoken to the son about goals of care. He will be bringing in her living will with him. Would be reasonable to address goals of care with him during patient's hospital stay.   LOS: 1 day   Emma Yu 12/26/2011, 5:12 PM

## 2011-12-26 NOTE — Clinical Social Work Psychosocial (Signed)
     Clinical Social Work Department BRIEF PSYCHOSOCIAL ASSESSMENT 12/26/2011  Patient:  Emma Yu, Emma Yu     Account Number:  [id number]     Admit date:  12/25/2011  Clinical Social Worker:  Hulan Fray  Date/Time:  12/26/2011 04:08 PM  Referred by:  RN  Date Referred:  12/26/2011 Referred for  Other - See comment   Other Referral:   "Pt. Resides at [facility] at [facility]"   Interview type:   Other interview type:    PSYCHOSOCIAL DATA Living Status:  FACILITY Admitted from facility:  [facility] [place] Level of care:  Skilled Nursing Facility Primary support name:  Emma Yu [phone]) Primary support relationship to patient:  CHILD, ADULT Degree of support available:   supportive    CURRENT CONCERNS Current Concerns  None Noted   Other Concerns:    SOCIAL WORK ASSESSMENT / PLAN Clinical Social Worker received referral for patient as she was a resident of [facility]. CSW introduced self to patient explained reason for consult. Patient stated that she was from [facility], but her plans are to go home. CSW called patient's son, Emma Yu and he confirmed that patient discharged from [facility] and he picked up her belongings today. Both patient and son are aware that disposition could change and there could be a possible higher need of care. CSW will continue to follow for discharge planning needs.   Assessment/plan status:  Psychosocial Support/Ongoing Assessment of Needs Other assessment/ plan:   Information/referral to community resources:    PATIENTS/FAMILYS RESPONSE TO PLAN OF CARE: Patient stated that she wants to "go home" when she is medically ready.

## 2011-12-26 NOTE — Progress Notes (Signed)
Patient ID: Emma Yu, female   DOB: 06-22-1927, 78 y.o.   MRN: [id number]    Subjective:  Denies SSCP, palpitations or Dyspnea Mild headache  Objective:  Filed Vitals:   12/25/11 1615 12/25/11 2142 12/25/11 2311 12/26/11 0548  BP: 133/69 183/61 166/65 154/54  Pulse: 112 107  99  Temp: 98.4 F (36.9 C) 100.1 F (37.8 C)  98.5 F (36.9 C)  TempSrc: Oral Oral  Oral  Resp:  20    Height: 5\' 4"  (1.626 m)     Weight: 66.18 kg (145 lb 14.4 oz)   65.1 kg (143 lb 8.3 oz)  SpO2: 94% 93%  95%    Intake/Output from previous day:  Intake/Output Summary (Last 24 hours) at 12/26/11 0759 Last data filed at 12/26/11 4098  Gross per 24 hour  Intake    360 ml  Output    750 ml  Net   -390 ml    Physical Exam: Affect appropriate Healthy:  appears stated age HEENT: normal Neck supple with no adenopathy JVP normal bilateral  bruits no thyromegaly Lungs decrease BS bases  no wheezing and good diaphragmatic motion Heart:  S1/S2 AS murmur, no rub, gallop or click PMI normal Abdomen: benighn, BS positve, no tenderness, no AAA no bruit.  No HSM or HJR Distal pulses intact with no bruits No edema Neuro non-focal Skin warm and dry No muscular weakness   Lab Results: Basic Metabolic Panel:  Basename 12/26/11 0554 12/25/11 1639 12/25/11 0953  NA 138 -- 139  K 4.3 -- 5.3*  CL 101 -- 106  CO2 27 -- 25  GLUCOSE 155* -- 182*  BUN 31* -- 26*  CREATININE 1.18* -- 1.03  CALCIUM 10.1 -- 9.7  MG -- 1.8 --  PHOS -- -- --   Liver Function Tests: No results found for this basename: AST:2,ALT:2,ALKPHOS:2,BILITOT:2,PROT:2,ALBUMIN:2 in the last 72 hours No results found for this basename: LIPASE:2,AMYLASE:2 in the last 72 hours CBC:  Basename 12/26/11 0554 12/25/11 0953  WBC 11.1* 9.7  NEUTROABS -- 8.0*  HGB 9.0* 8.2*  HCT 28.9* 26.5*  MCV 92.0 92.7  PLT 309 281   Anemia Panel:  Basename 12/25/11 1639  VITAMINB12 894  FOLATE >20.0  FERRITIN 45  TIBC 316  IRON 19*    RETICCTPCT 3.9*    Imaging: Dg Chest Portable 1 View  12/25/2011  *RADIOLOGY REPORT*  Clinical Data: Shortness of breath, fever, heart attack in 2012, hypertension, diabetes, history CHF  PORTABLE CHEST - 1 VIEW  Comparison: Portable exam 0955 hours compared to 12/12/2011  Findings: Enlargement of cardiac silhouette with pulmonary vascular congestion. Perihilar to basilar interstitial infiltrates likely pulmonary edema and CHF increased since previous exam. Increased bibasilar effusions and minimal atelectasis. Dense atherosclerotic calcification aortic arch. No pneumothorax. Bones demineralized.  IMPRESSION: Increased CHF with associated bibasilar effusions and atelectasis.  Original Report Authenticated By: Lollie Marrow, M.D.    Cardiac Studies:  ECG: Afib poor R wave progression  No acute ischemic changes   Telemetry: Afib rate good no long pauses no VT    Medications:     . aspirin  162 mg Oral Daily  . aspirin  325 mg Oral Once  . B-complex with vitamin C  1 tablet Oral Daily  . cholecalciferol  2,000 Units Oral Daily  . cloNIDine  0.1 mg Oral BID  . furosemide  20 mg Intravenous BID  . furosemide  40 mg Intravenous Once  . gabapentin  600 mg Oral QHS  .  insulin aspart  0-15 Units Subcutaneous TID WC  . insulin glargine  25 Units Subcutaneous QHS  . metoprolol tartrate  25 mg Oral BID  . omega-3 acid ethyl esters  1 g Oral Daily  . pantoprazole  40 mg Oral Q1200  . simvastatin  10 mg Oral q1800  . sodium chloride  3 mL Intravenous Q12H  . vitamin C  500 mg Oral Daily  . DISCONTD: aspirin  325 mg Oral Once  . DISCONTD: b complex vitamins  1 tablet Oral Daily  . DISCONTD: Cinnamon  2,000 mg Oral Daily  . DISCONTD: Co Q 10  300 mg Oral Daily  . DISCONTD: fish oil-omega-3 fatty acids  2 g Oral Daily  . DISCONTD: Vitamin D3  2,000 Units Oral Daily       Assessment/Plan:  CHF: with right effusion Continue diuresis.  Diastolic dysfunction with AS Afib:  Rate control fine  Not a coumadin candidate due to falls and anemia DM:  Hospitalist to manage would like to get back on her insulin pump Anemia:  guaic stools per hospitalist  Charlton Haws 12/26/2011, 7:59 AM

## 2011-12-26 NOTE — Progress Notes (Signed)
UR Completed. Vester Titsworth, RN, Nurse Case Manager [phone]     

## 2011-12-27 ENCOUNTER — Inpatient Hospital Stay (HOSPITAL_COMMUNITY): Payer: Medicare Other

## 2011-12-27 ENCOUNTER — Telehealth: Payer: Self-pay | Admitting: Neurology

## 2011-12-27 ENCOUNTER — Encounter (HOSPITAL_COMMUNITY): Payer: Self-pay | Admitting: Physician Assistant

## 2011-12-27 DIAGNOSIS — E782 Mixed hyperlipidemia: Secondary | ICD-10-CM

## 2011-12-27 DIAGNOSIS — I509 Heart failure, unspecified: Secondary | ICD-10-CM

## 2011-12-27 DIAGNOSIS — I4891 Unspecified atrial fibrillation: Secondary | ICD-10-CM

## 2011-12-27 DIAGNOSIS — R112 Nausea with vomiting, unspecified: Secondary | ICD-10-CM

## 2011-12-27 DIAGNOSIS — N179 Acute kidney failure, unspecified: Secondary | ICD-10-CM

## 2011-12-27 LAB — URINALYSIS, ROUTINE W REFLEX MICROSCOPIC
Bilirubin Urine: NEGATIVE
Ketones, ur: NEGATIVE mg/dL
Nitrite: POSITIVE — AB
Specific Gravity, Urine: 1.011 (ref 1.005–1.030)
Urobilinogen, UA: 0.2 mg/dL (ref 0.0–1.0)
pH: 5 (ref 5.0–8.0)

## 2011-12-27 LAB — URINE MICROSCOPIC-ADD ON

## 2011-12-27 LAB — GLUCOSE, CAPILLARY
Glucose-Capillary: 147 mg/dL — ABNORMAL HIGH (ref 70–99)
Glucose-Capillary: 127 mg/dL — ABNORMAL HIGH (ref 70–99)
Glucose-Capillary: 168 mg/dL — ABNORMAL HIGH (ref 70–99)

## 2011-12-27 MED ORDER — FERROUS SULFATE 325 (65 FE) MG PO TABS: 325.0000 mg | ORAL_TABLET | Freq: Three times a day (TID) | ORAL | Status: DC

## 2011-12-27 MED ORDER — LEVOFLOXACIN IN D5W 500 MG/100ML IV SOLN
500.0000 mg | INTRAVENOUS | Status: AC
  Administered 2011-12-27: 500 mg via INTRAVENOUS
  Filled 2011-12-27 (×2): qty 100

## 2011-12-27 MED ORDER — METOPROLOL TARTRATE 25 MG PO TABS
25.0000 mg | ORAL_TABLET | Freq: Every day | ORAL | Status: DC
  Administered 2011-12-27 – 2011-12-30 (×4): 25 mg via ORAL
  Filled 2011-12-27 (×5): qty 1

## 2011-12-27 MED ORDER — METOPROLOL TARTRATE 25 MG PO TABS
25.0000 mg | ORAL_TABLET | Freq: Once | ORAL | Status: AC
  Administered 2011-12-27: 25 mg via ORAL
  Filled 2011-12-27: qty 1

## 2011-12-27 MED ORDER — LEVOFLOXACIN IN D5W 250 MG/50ML IV SOLN
250.0000 mg | INTRAVENOUS | Status: DC
  Administered 2011-12-28 – 2011-12-29 (×2): 250 mg via INTRAVENOUS
  Filled 2011-12-27 (×3): qty 50

## 2011-12-27 MED ORDER — FUROSEMIDE 20 MG PO TABS: 20.0000 mg | ORAL_TABLET | Freq: Two times a day (BID) | ORAL | Status: DC

## 2011-12-27 MED ORDER — METOPROLOL TARTRATE 50 MG PO TABS
50.0000 mg | ORAL_TABLET | Freq: Every day | ORAL | Status: DC
  Administered 2011-12-28 – 2011-12-31 (×4): 50 mg via ORAL
  Filled 2011-12-27 (×5): qty 1

## 2011-12-27 MED ORDER — DILTIAZEM HCL 30 MG PO TABS
30.0000 mg | ORAL_TABLET | Freq: Three times a day (TID) | ORAL | Status: DC
  Administered 2011-12-27 – 2011-12-30 (×9): 30 mg via ORAL
  Filled 2011-12-27 (×14): qty 1

## 2011-12-27 MED ORDER — DICLOFENAC SODIUM 1 % TD GEL: 1.0000 "application " | Freq: Four times a day (QID) | TRANSDERMAL | Status: DC | PRN

## 2011-12-27 MED ORDER — METOPROLOL TARTRATE 25 MG PO TABS: ORAL_TABLET | ORAL | Status: DC

## 2011-12-27 MED ORDER — METOPROLOL TARTRATE 1 MG/ML IV SOLN
5.0000 mg | Freq: Once | INTRAVENOUS | Status: AC
  Administered 2011-12-27: 5 mg via INTRAVENOUS
  Filled 2011-12-27 (×2): qty 5

## 2011-12-27 NOTE — Consult Note (Addendum)
Name: Emma Yu MRN: [id number] DOB: 1926/11/21    LOS: 2  [facility] Critical Care Note   History of Present Illness:  78 y/o F, former smoker, with PMH of DM, HTN, HLD, PAD, Diastolic CHF, Afib, DJD, Moderate AS, Mild Mitral Stenosis, Anemia and significant CAD by cath 5/12 admitted to [facility] on 5/7 with complaints of dyspnea.  She was recently discharged 4/30 after recurrent falls with scalp laceration, Afib with RVR, renal failure and dehydration.  She was volume resuscitated during that admit and lasix held at time of discharge.  5/9 was being prepared for discharge and patient was noted to have desaturations on RA, mild leukocytosis and temp of 100.4.  PCCM consulted for hypoxemia.      Cultures: none  Antibiotics: none   Tests / Events: 4/22 ECHO>>>Left ventricle: Wall thickness was increased in a patternof moderate LVH. Systolic function was vigorous. The estimated ejection fraction was in the range of 65% to 70%.Aortic valve: Moderately calcified annulus. There was moderate stenosis. Valve area: 3.22cm^2(VTI). Valve area:3.05cm^2 (Vmax). Mitral valve: The findings are consistent with mild stenosis.     Past Medical History  Diagnosis Date  . PAD (peripheral artery disease)     s/p bilateral comon iliac artery stenting in 2002. Known significant  R SFA  disease. carotid dz noted 11/2011 followed by Dr. Imogene Burn  . Diabetes mellitus   . Hypertension   . Hyperlipidemia   . Venous insufficiency   . Diastolic CHF     normal EF  . Atrial fibrillation     not a coumadin candidate secondary to fall risk  . DJD (degenerative joint disease) of hip     s/p R THR 10/2010  . Aortic stenosis     Moderate  . Mitral stenosis     Mild  . Anemia     Past Surgical History  Procedure Date  . Total hip arthroplasty 1991, 1994    redo in 1994  . Lumbar disc surgery 1999  . Cardiac catheterization 01/10/2011    No significant CAD    Prior to Admission medications     Medication Sig Start Date End Date Taking? Authorizing Provider  acetaminophen (TYLENOL) 325 MG tablet Take 325 mg by mouth daily as needed. for pain.   Yes Historical Provider, MD  Ascorbic Acid (VITAMIN C) 500 MG tablet Take 500 mg by mouth daily.     Yes Historical Provider, MD  aspirin 81 MG chewable tablet Chew 2 tablets (162 mg total) by mouth daily. 12/18/11 12/17/12 Yes Laveda Norman, MD  b complex vitamins tablet Take 1 tablet by mouth daily.     Yes Historical Provider, MD  Cholecalciferol (VITAMIN D3) 2000 UNITS capsule Take 2,000 Units by mouth daily.     Yes Historical Provider, MD  Cinnamon 500 MG capsule Take 2,000 mg by mouth daily.    Yes Historical Provider, MD  cloNIDine (CATAPRES) 0.1 MG tablet Take 1 tablet (0.1 mg total) by mouth 2 (two) times daily. 12/18/11 12/17/12 Yes Laveda Norman, MD  Coenzyme Q10 (CO Q 10) 100 MG CAPS Take 300 mg by mouth daily.   Yes Historical Provider, MD  fish oil-omega-3 fatty acids 1000 MG capsule Take 2 g by mouth daily.     Yes Historical Provider, MD  gabapentin (NEURONTIN) 300 MG capsule Take 600 mg by mouth at bedtime. 10/31/11  Yes Etta Grandchild, MD  insulin aspart (NOVOLOG) 100 UNIT/ML injection Inject 7 Units into the skin  3 (three) times daily after meals. 12/18/11 12/17/12 Yes Laveda Norman, MD  insulin glargine (LANTUS) 100 UNIT/ML injection Inject 25 Units into the skin at bedtime. 12/18/11 12/17/12 Yes Laveda Norman, MD  methocarbamol (ROBAXIN) 500 MG tablet Take 500 mg by mouth 3 (three) times daily as needed. For muscle spasms 04/19/11  Yes Newt Lukes, MD  omeprazole (PRILOSEC) 40 MG capsule Take 40 mg by mouth 2 (two) times daily.  10/31/11  Yes Etta Grandchild, MD  pravastatin (PRAVACHOL) 20 MG tablet Take 20 mg by mouth every evening. 10/31/11 10/30/12 Yes Etta Grandchild, MD  traMADol (ULTRAM) 50 MG tablet Take 1 tablet (50 mg total) by mouth every 6 (six) hours as needed. For pain 12/18/11  Yes Laveda Norman, MD  diclofenac sodium (VOLTAREN) 1  % GEL Apply 1 application topically 4 (four) times daily as needed. For pain 12/27/11   Laurann Montana, PA  ferrous sulfate 325 (65 FE) MG tablet Take 1 tablet (325 mg total) by mouth 3 (three) times daily with meals. 12/27/11 12/26/12  Dayna N Dunn, PA  furosemide (LASIX) 20 MG tablet Take 1 tablet (20 mg total) by mouth 2 (two) times daily. 12/27/11 12/26/12  Dayna N Dunn, PA  metoprolol tartrate (LOPRESSOR) 25 MG tablet Take 2 tablets (50mg ) by mouth in the morning and 1 tablet (25mg ) in the evening. 12/27/11   Laurann Montana, PA    Allergies Allergies  Allergen Reactions  . Carvedilol Other (See Comments)    Heart stops  . Diltiazem Hcl Other (See Comments)    Chest pain  . Fenofibrate Other (See Comments)    Unknown - [place] - MAR  . Hctz (Hydrochlorothiazide) Other (See Comments)    Chest pain  . Nisoldipine Other (See Comments)    Chest pain  . Nitroglycerin Other (See Comments)    Unknown - [place] MAR  . Propranolol Diarrhea    Chest pain  . Tekturna (Aliskiren Fumarate) Other (See Comments)    Unknown reaction  . Valturna (Aliskiren-Valsartan) Other (See Comments)    Unknown reaction    Family History Family History  Problem Relation Age of Onset  . Diabetes Father   . Diabetes Other     5/8 sibs    Social History  reports that she quit smoking about 26 years ago. Her smoking use included Cigarettes. She has a 50 pack-year smoking history. She has never used smokeless tobacco. She reports that she does not drink alcohol or use illicit drugs.  Review Of Systems:  Gen: Denies fever, chills, weight change, fatigue, night sweats HEENT: Denies blurred vision, double vision, hearing loss, tinnitus, sinus congestion, rhinorrhea, sore throat, neck stiffness, dysphagia PULM: Denies cough, sputum production, hemoptysis, wheezing.  Indicates shortness of breath.   CV: Denies chest pain, edema, orthopnea, paroxysmal nocturnal dyspnea, palpitations GI: Denies abdominal pain, nausea, vomiting,  diarrhea, hematochezia, melena, constipation, change in bowel habits GU: Denies dysuria, hematuria, polyuria, oliguria, urethral discharge Endocrine: Denies hot or cold intolerance, polyuria, polyphagia or appetite change Derm: Denies rash, dry skin, scaling or peeling skin change Heme: Denies easy bruising, bleeding, bleeding gums Neuro: Denies headache, numbness, weakness, slurred speech, loss of memory or consciousness   Vital Signs: Temp:  [98 F (36.7 C)-100.4 F (38 C)] 98 F (36.7 C) (05/09 1359) Pulse Rate:  [105-145] 124  (05/09 1359) Resp:  [18-22] 22  (05/09 1359) BP: (120-152)/(53-74) 126/72 mmHg (05/09 1359) SpO2:  [93 %-98 %] 95 % (05/09 1359)  Weight:  [140 lb 8 oz (63.73 kg)] 140 lb 8 oz (63.73 kg) (05/09 0534) I/O last 3 completed shifts: In: 910 [P.O.:895; I.V.:9; IV Piggyback:6] Out: 1552 [Urine:1550; Stool:2]  Physical Examination: General: elderly female in NAD Neuro: AAOx4, MAE, generalized weakness CV: s1s2 irregularly irregular classic sys murm rad carotid 4/6 PULM: resp's even/non-labored, lungs bilaterally with posterior crackles GI: round/ soft, bsx4 active Extremities: warm/dry, no edema  Labs    CBC  Lab 12/26/11 0554 12/25/11 0953  HGB 9.0* 8.2*  HCT 28.9* 26.5*  WBC 11.1* 9.7  PLT 309 281     BMET  Lab 12/26/11 0554 12/25/11 1639 12/25/11 0953  NA 138 -- 139  K 4.3 -- 5.3*  CL 101 -- 106  CO2 27 -- 25  GLUCOSE 155* -- 182*  BUN 31* -- 26*  CREATININE 1.18* -- 1.03  CALCIUM 10.1 -- 9.7  MG -- 1.8 --  PHOS -- -- --     Lab 12/25/11 0953  INR 1.18    No results found for this basename: PHART:5,PCO2:5,PCO2ART:5,PO2:5,PO2ART:5,HCO3:5,TCO2:5,O2SAT:5 in the last 168 hours   Radiology: 5/9 CXR>>>Interval slight improvement in CHF, though minimal interstitial edema persists. Stable bilateral pleural effusions, right greater than left, with associated dense passive atelectasis and/or pneumonia in the lower lobes. No new  abnormalities.    Assessment and Plan:  Dyspnea / Hypoxemia Assessment: Multifactorial hypoxemia & dyspnea in the setting of Afib with RVR, CHF, likely some degree of COPD given smoking Hx, and bilateral effusions.  Difficult to diurese in the setting of AS -recent admit with renal failure and dehydration.  Was discharged home off lasix.  Relative volume up and mild increase in renal failure.  Mild leukocytosis, tmax of 100.4 (likely related to UTI).  No cough, sputum production.   Plan: -hold abx for now -check pct Q24 x2 -continue stable diuresis, caution with moderate AS with volume dependence -if effusions not responsive to diuresis, would consider therapeutic thoracentesis, although likely to return  -f/u cxr -diuresis as renal fxn and BP permit -no bronchospasm, no indication for steroids   Best practices / Disposition: -->Code Status: Full Code -->DVT Px: SCD's -->GI Px: protonix -->Diet: PO  Canary Brim, NP-C [facility] Pgr: [phone]  12/27/2011, 5:19 PM    O2 needs low. Likely will need home O2? Hope renal fxn can tolerate diuresis in setting AS.  Rate control important, having bursts rate. Would continue lasix x 24 hrs, pcxr in am and make decision for [place]  / thora by PCCM if needed and lasix not tolerated. No secretions, no sig fever, no defined infiltrates. Pcxr in am , no indication abx at this stage. PCT for neg predictive value.  Mcarthur Rossetti. Tyson Alias, MD, FACP Pgr: [phone] [facility]

## 2011-12-27 NOTE — Progress Notes (Signed)
Patient does not want to return to [facility]. CSW contacted patient's son and he is agreeable to this. Patient's son will pick the patient up at 4pm. CM was notified that patient will need home health services.  Sabino Niemann, MSW, [employer] [phone]

## 2011-12-27 NOTE — Progress Notes (Signed)
Patient's heart rate is increased to the 130s sustaining. Patient is asymptomatic with no signs or symptoms of discomfort. At this present time patient is on the side of the bed eating. BP is 152/74 heart rate 139. PRN metoprolol was given and PA notified. No new orders given. Will continue to monitor patient for further changes in condition.

## 2011-12-27 NOTE — Progress Notes (Signed)
CSW was informed by CM that patient will need SNF placement. Patient is agreeable. CSW will begin SNF search.   Sabino Niemann, MSW, [employer] [phone]

## 2011-12-27 NOTE — Progress Notes (Addendum)
Subjective: Patient seen and examined this morning. Went into rapid Afib overnight and given IV lopressor. This am her HR has been in 102s to 130s. Given further dose of  IV lopressor. increased dose of po metoprolol . Also had temp of 100.4 CXR and UA ordered. patient denies any chills, dysuria or SOB.   Objective:  Vital signs in last 24 hours:  Filed Vitals:   12/27/11 0204 12/27/11 0534 12/27/11 0915 12/27/11 0958  BP: 120/57 138/53 125/63 147/68  Pulse: 127 130 108 130  Temp: 99 F (37.2 C) 100.4 F (38 C)    TempSrc: Oral Oral    Resp: 20 22    Height:      Weight:  63.73 kg (140 lb 8 oz)    SpO2: 98% 94%      Intake/Output from previous day:   Intake/Output Summary (Last 24 hours) at 12/27/11 1311 Last data filed at 12/27/11 1100  Gross per 24 hour  Intake    555 ml  Output    301 ml  Net    254 ml    Physical Exam:  General:elderly female in no acute distress.  HEENT: no pallor, no icterus, moist oral mucosa, no JVD, no lymphadenopathy  Heart:s1&S2 Irregularly irregular,  systolic murmur + ,  no rubs, gallops.  Lungs: improved breath sounds bilaterally Abdomen: Soft, nontender, nondistended, positive bowel sounds.  Extremities: No clubbing cyanosis or edema with positive pedal pulses.  Neuro: Alert, awake, oriented x3, nonfocal.   Lab Results:  Basic Metabolic Panel:    Component Value Date/Time   NA 138 12/26/2011 0554   K 4.3 12/26/2011 0554   CL 101 12/26/2011 0554   CO2 27 12/26/2011 0554   BUN 31* 12/26/2011 0554   CREATININE 1.18* 12/26/2011 0554   GLUCOSE 155* 12/26/2011 0554   CALCIUM 10.1 12/26/2011 0554   CBC:    Component Value Date/Time   WBC 11.1* 12/26/2011 0554   HGB 9.0* 12/26/2011 0554   HCT 28.9* 12/26/2011 0554   PLT 309 12/26/2011 0554   MCV 92.0 12/26/2011 0554   NEUTROABS 8.0* 12/25/2011 0953   LYMPHSABS 0.7 12/25/2011 0953   MONOABS 0.9 12/25/2011 0953   EOSABS 0.1 12/25/2011 0953   BASOSABS 0.0 12/25/2011 0953    No results found for this or any  previous visit (from the past 240 hour(s)).  Studies/Results: Dg Chest Port 1 View  12/27/2011  *RADIOLOGY REPORT*  Clinical Data: Follow up CHF and effusions.  PORTABLE CHEST - 1 VIEW 12/27/2011 1010 hours:  Comparison: Portable chest x-rays 12/25/2011, 12/12/2011 dating back to 12/10/2011, and two-view chest x-ray 12/09/2011.  Findings: Cardiac silhouette enlarged but stable.  Interval slight improvement in the interstitial pulmonary edema, though minimal interstitial edema persists.  Stable bilateral pleural effusions, left greater than right, and associated dense consolidation in the lower lobes.  Extensive mitral annular calcification again noted. No new pulmonary parenchymal abnormalities.  IMPRESSION: Interval slight improvement in CHF, though minimal interstitial edema persists.  Stable bilateral pleural effusions, right greater than left, with associated dense passive atelectasis and/or pneumonia in the lower lobes.  No new abnormalities.  Original Report Authenticated By: Arnell Sieving, M.D.    Medications: Scheduled Meds:   . aspirin  162 mg Oral Daily  . B-complex with vitamin C  1 tablet Oral Daily  . cholecalciferol  2,000 Units Oral Daily  . cloNIDine  0.1 mg Oral BID  . ferrous sulfate  325 mg Oral TID WC  . furosemide  20 mg Intravenous BID  . gabapentin  600 mg Oral QHS  . insulin aspart  0-15 Units Subcutaneous TID WC  . insulin glargine  25 Units Subcutaneous QHS  . metoprolol      . metoprolol  5 mg Intravenous NOW  . metoprolol  5 mg Intravenous Once  . metoprolol tartrate  50 mg Oral QPC breakfast  . metoprolol tartrate  25 mg Oral QPC supper  . metoprolol tartrate  25 mg Oral Once  . omega-3 acid ethyl esters  1 g Oral Daily  . pantoprazole  40 mg Oral Q1200  . simvastatin  10 mg Oral q1800  . sodium chloride  3 mL Intravenous Q12H  . vitamin C  500 mg Oral Daily  . DISCONTD: metoprolol tartrate  25 mg Oral BID   Continuous Infusions:  PRN Meds:.sodium  chloride, acetaminophen, diclofenac sodium, methocarbamol, metoprolol, ondansetron (ZOFRAN) IV, sodium chloride, traMADol, zolpidem  Assessment/ 78 y/o female with hx of DM, peripheral neuropathy, paroxysmal afib, moderate AS, bilateral non critical ICA stenosis was admitted with SOB, fatigue and dehydration. Medical consult following her regarding her DM and anemia.     Afib  Went into RVR overnight. Given doses of IV lopressor and increased dose of metoprolol. SOB improved. Also had a temp of 100.18F . Cannot r/o an underlying infection. CXR shows improvement in CHF, not very convincing for an infiltrate. Have ordered UA. Possible for fever and ? Infection causing rapid Afib. Not a coumadin candidate due to fall risk and anemia  Cont tele monitoring    Anemia:  Stool for occult blood negative  She informs having colonoscopy about 7-8 years back.  Iron panel suggests iron deficiency anemia. Hb unchanged from recent labs  B12 , TSH wnl  Will start on iron sulfate   Diabetes mellitus type 2  Continue lantus at current dose and SSI  Wanted to go back on her insulin pump, but as per office notes from her PCP she would not benefit from being on a pump due to tendency to overcorrect. Last A1C of only 6   Chest pain  likely secondary to her AS. Not a surgical candidate  Currently on BB, off cardizem  High risk for decompensation  CHF  associated diastolic dysn with AS  Recent echo with good LV fn  Cont diuresis    severe PVD  Follows Dr Imogene Burn as outpt  Cont neurontin   HTN  stable  Cont metoprolol and lasix  Earlier hospitalist has spoken to the son about goals of care. He will be bringing in her living will with him. Would be reasonable to address goals of care with him during patient's hospital stay.    Cardiology plan on discharging patient later today. Patient remains afebrile since am. UA ordered.    Medical consult will sign off . Please call for any questions.  Thank  you for allowing [place] to participate in patient's care    LOS: 2 days   Anaelle Dunton 12/27/2011, 1:11 PM   patient was noted to be hypoxic on RA . PCCM consult was called for evaluation. recommend repeat CXR in am. Will check PA/lateral CXR in am UA positive for UTI. Will send urine cx. Start on IV  Levaquin for empiric coverage. Patient planned for d/c to SNF. SW working on placement.  medical consult will continue to follow regarding her  medical issues

## 2011-12-27 NOTE — Progress Notes (Signed)
Patient still sustaining 110-130 in afib on tele even after increased dose of PO metoprolol and PRN IV metoprolol. O2 sat 90-92 on RA at edge of bed, desatting to 83% on ambulation. She comes back up to 92% on RA. T 100.4 this AM. WBC mildly elevated yesterday. See CXR results. Initially this was felt due to atx but may need further w/o. Per discussion with Dr. Eden Emms, will hold discharge today, add cardizem 30mg  q8hrs (confirmed), add IS, and ask pulmonology to see. Patient made aware she won't be going home today.  Glennda Weatherholtz PA-C

## 2011-12-27 NOTE — Discharge Summary (Signed)
Discharge Summary   Patient ID: Emma Yu MRN: [id number], DOB/AGE: 76-Dec-1928 76 y.o. Admit date: 12/25/2011 D/C date:     12/27/2011   Primary Discharge Diagnoses:  1. Acute on chronic diastolic CHF 2. Atrial fibrillation with increased rates 3. Anemia with guiaic negative-stools 4. Urinary tract infection 5. Acute respiratory failure, resolved  Secondary Discharge Diagnoses:  1. Mod aortic stenosis 11/2011 2. Mild mitral regurgitation 11/2011 3. PAD - s/p bilateral comon iliac artery stenting in 2002, known significant R SFA disease - carotid disease 11/2011 4. Minor luminal irregularities by cath 12/2010 without significant CAD 5. Diabetes mellitus 6. HTN 7. Hyperlipidemia 8. Venous insufficiency 9. DJD  Hospital Course: 78 y/o F with hx of PVD, mod AS, HTN, HLD, Diastolic CHF, PAF (s/p DCCV 10/2010), no significant CAD by cath 12/2010 who presented to [facility] on 12/25/2011 with complaints of dyspnea. Last echo 11/2011 showed moderate LVH, EF 65-70%, moderate AS, valve area 3.05cm^2 (Vmax), mild MS. Last cardiac cath 12/2010 revealed no significant CAD, mildly elevated LVEDP. She developed A.Fib w/ RVR after hip surgery in march of last year requiring DCCV with conversion to NSR. She was recently seen at [facility] following a fall resulting in a laceration needing sutures. She went home and developed diarrhea and weakness and returned to the [facility] recently where she was found to be anemic and dehydrated with acute renal failure requiring admission. CT head was without acute findings. During that admission, she went into A.Fib w/ RVR requiring cardizem and BB for rate control (she was discharged in A.Fib). No anticoagulation 2/2 head trauma and falls. She was discharged to [facility] after that admission. The patient returned to the ER 12/25/11 feeling SOB and fatigued yesterday requiring supplemental O2. She also noted epigastric/chest discomfort 3-4 days  prior to admission, intermittent, described as a tightness that goes to her back. It was similar to the indigestion she's had in the past. In the ED, EKG revealed A.Fib 76bpm with no significant ST changes. It was felt this was likely not ACS. CXR shows increased CHF with associated bibasilar effusions and atelectasis. Labs significant for normal poc troponin, BNP 2181, H&H 8.2/26.5, K+ 5.3. She received 40mg  IV lasix and 325 ASA in the ER. It was felt she likely had acute on chronic diastolic CHF. She was admitted for IV diuresis. For afib, her BB was continued. As before she was not felt a good candidate for Coumadin. She also reported dark stools and was seen by internal medicine for this - they reported a hx of R hemicolectomy in 2002. Her stools were negative for occult blood but her iron panel suggested IDA so she was started on FeSO4.  They also consulted regarding her diabetes and felt he likely would not benefit from insulin pump placement as there has been a tendency in her case (per Dr. Rosary Lively numerous office notes) to try to overcontrol this. She remained on her home insulin regimen at discharge. She diuresed well but continued to have elevated HR in the setting of low-grade fever and UTI. She was started on diltiazem & Levaquin. (Please note diltiazem is listed in allergies but she tolerated this prior to admission and while in the hospital.) She was noted to be hypoxic with ambulation with O2 sat down to 83% thus pulmonology was consulted. They did not feel that her hypoxia was related to pneumonia but rather her effusions, CHF and possibly some degree of COPD given smoking hx. Her procalcitonin  remained low which pulmonary felt was a good sign - they recommended a f/u CXR next week. On 5/11 she was able to be changed to PO Lasix. She did require further uptitration of her diltiazem for improved rate control. This morning she is doing well. Dr. Jens Som has recommended 7 days total of Levaquin. The  patient will be going home to [facility]. She was seen and examined today and felt stable for discharge by Dr. Jens Som. Her respiratory failure has also resolved she is now consistently satting 94-97% on RA.  Per pulmonary recommendation she will have a CXR this upcoming week given effusions and question of PNA. She will also have a BMET in 1 week per Dr. Ludwig Clarks recommendation.  Discharge Vitals: Blood pressure 160/72, pulse 96, temperature 97.2 F (36.2 C), temperature source Oral, resp. rate 18, height 5\' 4"  (1.626 m), weight 137 lb 12.6 oz (62.5 kg), SpO2 97.00%. (BP 106/74 - 160/72 last 4 vital checks)  Labs: Lab Results  Component Value Date   WBC 11.1* 12/26/2011   HGB 9.0* 12/26/2011   HCT 28.9* 12/26/2011   MCV 92.0 12/26/2011   PLT 309 12/26/2011     Lab 12/26/11 0554  NA 138  K 4.3  CL 101  CO2 27  BUN 31*  CREATININE 1.18*  CALCIUM 10.1  PROT --  BILITOT --  ALKPHOS --  ALT --  AST --  GLUCOSE 155*    Lab Results  Component Value Date   CHOL 109 09/10/2011   HDL 49.90 09/10/2011   LDLCALC 35 09/10/2011   TRIG 121.0 09/10/2011     Diagnostic Studies/Procedures    1.Dg Chest Port 1 View 12/27/2011  *RADIOLOGY REPORT*  Clinical Data: Follow up CHF and effusions.  PORTABLE CHEST - 1 VIEW 12/27/2011 1010 hours:  Comparison: Portable chest x-rays 12/25/2011, 12/12/2011 dating back to 12/10/2011, and two-view chest x-ray 12/09/2011.  Findings: Cardiac silhouette enlarged but stable.  Interval slight improvement in the interstitial pulmonary edema, though minimal interstitial edema persists.  Stable bilateral pleural effusions, left greater than right, and associated dense consolidation in the lower lobes.  Extensive mitral annular calcification again noted. No new pulmonary parenchymal abnormalities.  IMPRESSION: Interval slight improvement in CHF, though minimal interstitial edema persists.  Stable bilateral pleural effusions, right greater than left, with associated dense  passive atelectasis and/or pneumonia in the lower lobes.  No new abnormalities.  Original Report Authenticated By: Arnell Sieving, M.D.   Discharge Medications   Medication List  As of 12/31/2011  9:07 AM   TAKE these medications         acetaminophen 325 MG tablet   Commonly known as: TYLENOL   Take 325 mg by mouth daily as needed. for pain.      aspirin 81 MG chewable tablet   Chew 2 tablets (162 mg total) by mouth daily.      b complex vitamins tablet   Take 1 tablet by mouth daily.      Cinnamon 500 MG capsule   Take 2,000 mg by mouth daily.      cloNIDine 0.1 MG tablet   Commonly known as: CATAPRES   Take 1 tablet (0.1 mg total) by mouth 2 (two) times daily.      Co Q 10 100 MG Caps   Take 300 mg by mouth daily.      diclofenac sodium 1 % Gel   Commonly known as: VOLTAREN   Apply 1 application topically 4 (four) times daily as  needed. For pain      diltiazem 240 MG 24 hr capsule   Commonly known as: CARDIZEM CD   Take 1 capsule (240 mg total) by mouth daily.      ferrous sulfate 325 (65 FE) MG tablet   Take 1 tablet (325 mg total) by mouth 3 (three) times daily with meals.      fish oil-omega-3 fatty acids 1000 MG capsule   Take 2 g by mouth daily.      furosemide 20 MG tablet   Commonly known as: LASIX   Take 1 tablet (20 mg total) by mouth 2 (two) times daily.      gabapentin 300 MG capsule   Commonly known as: NEURONTIN   Take 600 mg by mouth at bedtime.      insulin aspart 100 UNIT/ML injection   Commonly known as: novoLOG   Inject 7 Units into the skin 3 (three) times daily after meals.      insulin glargine 100 UNIT/ML injection   Commonly known as: LANTUS   Inject 25 Units into the skin at bedtime.      levofloxacin 250 MG tablet   Commonly known as: LEVAQUIN   Take 1 tablet (250 mg total) by mouth daily.      methocarbamol 500 MG tablet   Commonly known as: ROBAXIN   Take 500 mg by mouth 3 (three) times daily as needed. For muscle spasms        metoprolol tartrate 25 MG tablet   Commonly known as: LOPRESSOR   Take 2 tablets (50mg ) by mouth in the morning and 1 tablet (25mg ) in the evening.      omeprazole 40 MG capsule   Commonly known as: PRILOSEC   Take 40 mg by mouth 2 (two) times daily.      pravastatin 20 MG tablet   Commonly known as: PRAVACHOL   Take 20 mg by mouth every evening.      traMADol 50 MG tablet   Commonly known as: ULTRAM   Take 1 tablet (50 mg total) by mouth every 6 (six) hours as needed. For pain      vitamin C 500 MG tablet   Commonly known as: ASCORBIC ACID   Take 500 mg by mouth daily.      Vitamin D3 2000 UNITS capsule   Take 2,000 Units by mouth daily.           Re: Voltaren - she was instructed "Before continuing, please talk to your primary care doctor about alternatives to this medicine since you have anemia. Although less often than the pill form, this medicine can sometimes increase the risk of stomach bleeding"  Disposition   The patient will be discharged in stable condition to home. Discharge Orders    Future Appointments: Provider: Department: Dept Phone: Center:   01/07/2012 2:00 PM Etta Grandchild, MD [facility] [phone] [facility]   01/10/2012 9:50 AM Beatrice Lecher, PA [facility] [phone] [facility]   02/28/2012 1:00 PM Etta Grandchild, MD [facility] [phone] [facility]   02/28/2012 1:45 PM Romero Belling, MD [facility] [phone] [facility]     Future Orders Please Complete By Expires   Diet - low sodium heart healthy      Comments:   Diabetic Diet   Increase activity slowly        Follow-up Information    Follow up with Primary Care Doctor. (Please call for an appointment to be seen in followup within this week for monitoring  of your anemia and diabetes. )       Follow up with Tereso Newcomer, PA. (You will see Tereso Newcomer PA-C at Dr. Fabio Bering office on 01/10/12 at 9:50am)    Contact information:   [address] [place]  [address] [phone]       Follow up with Chest X-Ray. (Please have a chest x-ray 01/04/12  - results should be sent to Dr. Fabio Bering office (see order slip).)       Follow up with Labwork. (Please have BMET drawn on 01/07/12 - results should be sent to Dr. Fabio Bering office (see lab slip).)              Duration of Discharge Encounter: Greater than 30 minutes including physician and PA time.  Signed, Nelani Schmelzle PA-C 12/27/2011, 1:09 PM

## 2011-12-27 NOTE — Progress Notes (Signed)
2036 Was notified by monitoring tech that patient's HR was in the 140's sustaining with a rhythm of atrial fibrillation. MD notified and rapid response was called and notified at 0847. Patient vitals signs were taken and EKG was done. Rapid response nurse and on call MD arrived at the bedside. Patient was assessed this time by MD and rapid response. Orders given for metoprolol IV stat and prn with parameters. Patient had an episode of increased HR in the 110's to 120's and prn metoprolol given. Patient had another episode were her HR was 150 this am at 0644. MD on call was paged. Rapid response was called at 0651 and notified. Was told to go ahead and give patient metoprolol IV prn. Patient remained alert and oriented times four, responsive, with no c/o fluttering or light-headness.

## 2011-12-27 NOTE — Care Management Note (Signed)
    Page 1 of 2   12/27/2011     3:19:47 PM   CARE MANAGEMENT NOTE 12/27/2011  Patient:  Emma Yu, Emma Yu   Account Number:  [id number]  Date Initiated:  12/27/2011  Documentation initiated by:  Tera Mater  Subjective/Objective Assessment:   78yo female admitted with SOB.  Prior to admit was at [facility].     Action/Plan:   CSW spoke with pt. about returning to [facility] and pt. did not want to go back.  Son, Alinda Money, agreed to take pt. home instead.  This NCM offered HH services.   Anticipated DC Date:  12/27/2011   Anticipated DC Plan:  HOME W HOME HEALTH SERVICES  In-house referral  Clinical Social Worker      DC Planning Services  CM consult      [facility] Choice  HOME HEALTH   Choice offered to / List presented to:  C-1 Patient        HH arranged  HH-1 RN  HH-4 NURSE'S AIDE      HH agency  [facility]   Status of service:  Completed, signed off Medicare Important Message given?   (If response is "NO", the following Medicare IM given date fields will be blank) Date Medicare IM given:   Date Additional Medicare IM given:    Discharge Disposition:  HOME W HOME HEALTH SERVICES  Per UR Regulation:  Reviewed for med. necessity/level of care/duration of stay  If discussed at Long Length of Stay Meetings, dates discussed:    Comments:  12/27/11 1500 Spoke with pt. about HH choice and pt. chose [facility].  This NCM called [facility], and they could not make a visit until Monday. Explained this to pt. and she then chose [facility].  TC to Hilda Lias with [facility] to give referral for [facility] RN and [facility] aide, for [facility] tomorrow.  Pt. has walker at home. Tessie Eke, pt. son, and updated him. Tera Mater, RN, BSN NCM [phone]

## 2011-12-27 NOTE — Progress Notes (Signed)
Patient's heart rate has the tendency to increase to the 150s non-sustaining. Yesterday patient's heart rate increased to the 150s non-sustaining, due to her being in the restroom and having a small accident. Last night, I received in report that her heart rate went in the 150s sustaining while at rest. During my morning assessment patient stated that she felt weak and she looks pale. She denies having any pain and is running a low grade temperature, but heart rate continues to increase to the 150s non-sustaining, but when her heart rate decreases it is hanging out in the 110s to 120s. [facility] MD was made aware and [place] PA was notified. New orders was given. Will continue to monitor patient for further changes in condition.

## 2011-12-27 NOTE — H&P (Signed)
Note opened and initially signed in error as H&P. Please refer to D/C Summary. Ela Moffat PA-C

## 2011-12-27 NOTE — Progress Notes (Signed)
Urine positive for nitrite, many bacteria. UCx has already been ordered by internal medicine along with levaquin.  Avital Dancy PA-C

## 2011-12-27 NOTE — Progress Notes (Signed)
Subjective:  Denies SSCP, palpitations or Dyspnea Wants to go home  Objective:  Filed Vitals:   12/26/11 2116 12/27/11 0204 12/27/11 0534 12/27/11 0915  BP: 125/67 120/57 138/53 125/63  Pulse: 105 127 130 108  Temp:  99 F (37.2 C) 100.4 F (38 C)   TempSrc:  Oral Oral   Resp:  20 22   Height:      Weight:   63.73 kg (140 lb 8 oz)   SpO2: 93% 98% 94%     Intake/Output from previous day:  Intake/Output Summary (Last 24 hours) at 12/27/11 0933 Last data filed at 12/27/11 0853  Gross per 24 hour  Intake    555 ml  Output    652 ml  Net    -97 ml    Physical Exam: Affect appropriate Healthy:  appears stated age HEENT: normal Neck supple with no adenopathy JVP normal bilateral  bruits no thyromegaly Lungs decrease BS bases  no wheezing and good diaphragmatic motion Heart:  S1/S2 AS murmur, no rub, gallop or click PMI normal Abdomen: benighn, BS positve, no tenderness, no AAA no bruit.  No HSM or HJR Distal pulses intact with no bruits No edema Neuro non-focal Skin warm and dry No muscular weakness   Lab Results: Basic Metabolic Panel:  Basename 12/26/11 0554 12/25/11 1639 12/25/11 0953  NA 138 -- 139  K 4.3 -- 5.3*  CL 101 -- 106  CO2 27 -- 25  GLUCOSE 155* -- 182*  BUN 31* -- 26*  CREATININE 1.18* -- 1.03  CALCIUM 10.1 -- 9.7  MG -- 1.8 --  PHOS -- -- --   Liver Function Tests: No results found for this basename: AST:2,ALT:2,ALKPHOS:2,BILITOT:2,PROT:2,ALBUMIN:2 in the last 72 hours No results found for this basename: LIPASE:2,AMYLASE:2 in the last 72 hours CBC:  Basename 12/26/11 0554 12/25/11 0953  WBC 11.1* 9.7  NEUTROABS -- 8.0*  HGB 9.0* 8.2*  HCT 28.9* 26.5*  MCV 92.0 92.7  PLT 309 281   Anemia Panel:  Basename 12/25/11 1639  VITAMINB12 894  FOLATE >20.0  FERRITIN 45  TIBC 316  IRON 19*  RETICCTPCT 3.9*    Imaging: Dg Chest Portable 1 View  12/25/2011  *RADIOLOGY REPORT*  Clinical Data: Shortness of breath, fever, heart  attack in 2012, hypertension, diabetes, history CHF  PORTABLE CHEST - 1 VIEW  Comparison: Portable exam 0955 hours compared to 12/12/2011  Findings: Enlargement of cardiac silhouette with pulmonary vascular congestion. Perihilar to basilar interstitial infiltrates likely pulmonary edema and CHF increased since previous exam. Increased bibasilar effusions and minimal atelectasis. Dense atherosclerotic calcification aortic arch. No pneumothorax. Bones demineralized.  IMPRESSION: Increased CHF with associated bibasilar effusions and atelectasis.  Original Report Authenticated By: Lollie Marrow, M.D.    Cardiac Studies:  ECG: Afib poor R wave progression  No acute ischemic changes   Telemetry: Afib rates a bit high with activity 120     Medications:      . aspirin  162 mg Oral Daily  . B-complex with vitamin C  1 tablet Oral Daily  . cholecalciferol  2,000 Units Oral Daily  . cloNIDine  0.1 mg Oral BID  . ferrous sulfate  325 mg Oral TID WC  . furosemide  20 mg Intravenous BID  . gabapentin  600 mg Oral QHS  . insulin aspart  0-15 Units Subcutaneous TID WC  . insulin glargine  25 Units Subcutaneous QHS  . metoprolol      . metoprolol  5 mg  Intravenous NOW  . metoprolol  5 mg Intravenous Once  . metoprolol tartrate  25 mg Oral BID  . omega-3 acid ethyl esters  1 g Oral Daily  . pantoprazole  40 mg Oral Q1200  . simvastatin  10 mg Oral q1800  . sodium chloride  3 mL Intravenous Q12H  . vitamin C  500 mg Oral Daily       Assessment/Plan:  CHF: with right effusion Improved.  Dyspnea resolved D/C with lasix 20 bid Afib:  Increase lopresser to 50 am and 25 pm  Not a coumadin candidate due to falls and anemia DM:  F/U Dr Everardo All early next week  Per hospitalist not going back on insulin pump Anemia:  guaic stools per primary MD  D/C to home latter today if CXR looks ok  I did discuss DNR status on admission with patient and son and she wishes to be a full code   Charlton Haws 12/27/2011, 9:33 AM

## 2011-12-27 NOTE — Progress Notes (Signed)
Called by bedside RN for patient showing afib rvr on telemetry monitor. Upon arrival patient's HR 130-140s with SBP 120s, patient had just moved from chair to bed. EKG obtained and confirmed afib RVR, which patient has known history. NP at bedside, 5mg  lopressor IV given, HR 90-100s, new orders received. Advised bedside RN to call if needed, will continue to monitor.

## 2011-12-27 NOTE — Progress Notes (Signed)
O2 sat on RA at rest : 92%  O2 sat on RA while ambulating: 83%  O2 sat oxygen @ 2L while ambulating: 97%

## 2011-12-27 NOTE — Telephone Encounter (Signed)
Pt is admitted to hospital. Had to cancel fu appointment. Pt's son is wondering if you will visit patient in hospital? She fell out of her bed due to her tremor.

## 2011-12-28 ENCOUNTER — Ambulatory Visit: Payer: Medicare Other | Admitting: Neurology

## 2011-12-28 ENCOUNTER — Inpatient Hospital Stay (HOSPITAL_COMMUNITY): Payer: Medicare Other

## 2011-12-28 DIAGNOSIS — I509 Heart failure, unspecified: Secondary | ICD-10-CM

## 2011-12-28 DIAGNOSIS — R112 Nausea with vomiting, unspecified: Secondary | ICD-10-CM

## 2011-12-28 DIAGNOSIS — J90 Pleural effusion, not elsewhere classified: Secondary | ICD-10-CM | POA: Diagnosis present

## 2011-12-28 DIAGNOSIS — I4891 Unspecified atrial fibrillation: Secondary | ICD-10-CM

## 2011-12-28 DIAGNOSIS — E782 Mixed hyperlipidemia: Secondary | ICD-10-CM

## 2011-12-28 LAB — GLUCOSE, CAPILLARY: Glucose-Capillary: 218 mg/dL — ABNORMAL HIGH (ref 70–99)

## 2011-12-28 LAB — BASIC METABOLIC PANEL
CO2: 30 mEq/L (ref 19–32)
Calcium: 9.4 mg/dL (ref 8.4–10.5)
Chloride: 98 mEq/L (ref 96–112)
Glucose, Bld: 139 mg/dL — ABNORMAL HIGH (ref 70–99)
Potassium: 3.7 mEq/L (ref 3.5–5.1)
Sodium: 137 mEq/L (ref 135–145)

## 2011-12-28 LAB — CBC
Hemoglobin: 8.6 g/dL — ABNORMAL LOW (ref 12.0–15.0)
MCH: 27.5 pg (ref 26.0–34.0)
Platelets: 210 10*3/uL (ref 150–400)
RBC: 3.13 MIL/uL — ABNORMAL LOW (ref 3.87–5.11)
WBC: 7.8 10*3/uL (ref 4.0–10.5)

## 2011-12-28 IMAGING — CR DG CHEST 1V PORT
1 series · 1 of 1 positions shown · non-contrast
Comparison: Portable exam 4998 hours compared to 11/09/2010

CLINICAL DATA: Weakness, shortness of breath

PORTABLE CHEST - 1 VIEW

[AP]
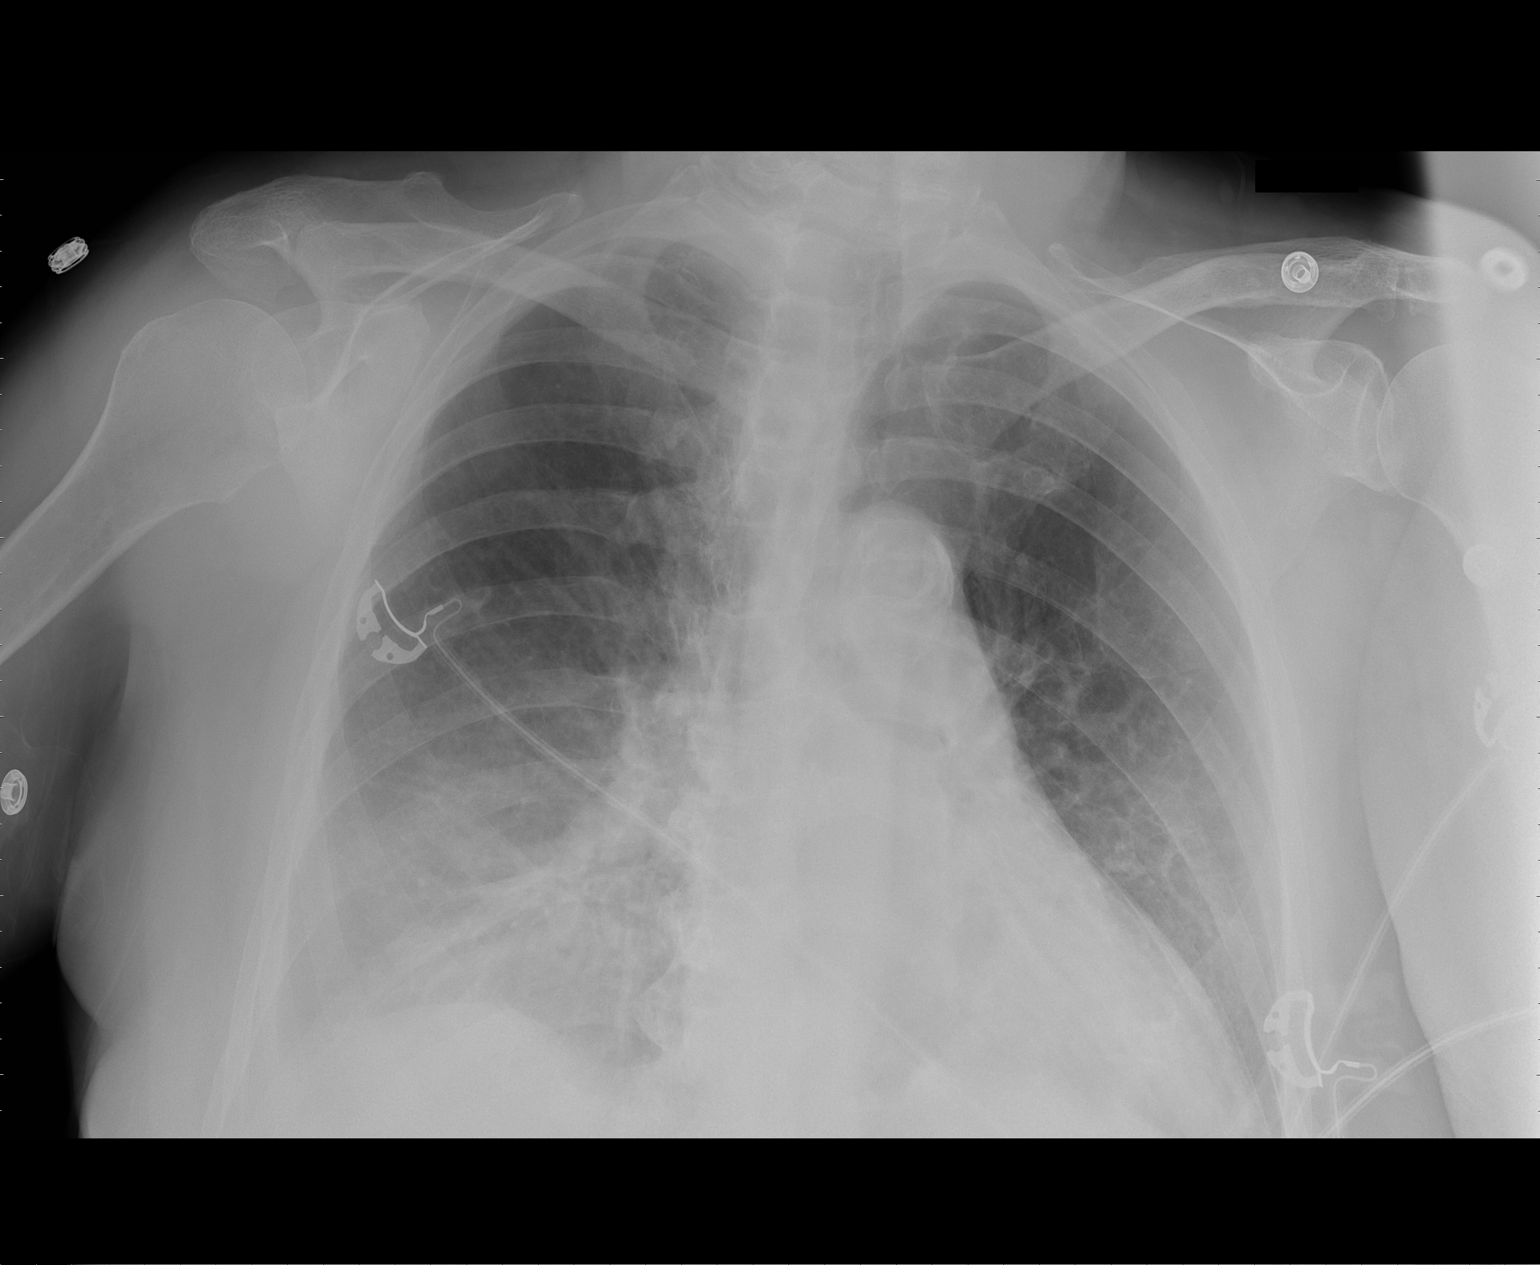

[1 of 1 positions shown; findings below may reference images not displayed]

FINDINGS: Rotated to the left.
Upper normal-sized cardiac silhouette.
Calcified aorta.
Bibasilar atelectasis and small right pleural effusion.
No definite segmental infiltrate.
No pneumothorax.
Osseous structures stable.
IMPRESSION: Bibasilar atelectasis and small right pleural effusion.
No significant interval change.

## 2011-12-28 NOTE — Progress Notes (Signed)
Subjective: Patient seen and examined this morning. Has low grade temperature today. informs SOB to be better. Noted for Afib with RVR in am.  Objective:  Vital signs in last 24 hours:  Filed Vitals:   12/27/11 2033 12/28/11 0445 12/28/11 1015 12/28/11 1329  BP: 143/64 117/65 120/61 122/59  Pulse: 133 137 138 101  Temp: 100.3 F (37.9 C) 98 F (36.7 C)    TempSrc: Oral Oral    Resp: 20 20    Height:      Weight:  63.05 kg (139 lb)    SpO2: 96% 94%      Intake/Output from previous day:   Intake/Output Summary (Last 24 hours) at 12/28/11 1342 Last data filed at 12/28/11 0954  Gross per 24 hour  Intake    607 ml  Output    705 ml  Net    -98 ml    Physical Exam:  General: elderly femlae in no acute distress. HEENT: no pallor, no icterus, moist oral mucosa, no JVD, no lymphadenopathy Heart: s1&s2 irregularly irregularly, without murmurs, rubs, gallops. Lungs: Clear to auscultation bilaterally. Abdomen: Soft, nontender, nondistended, positive bowel sounds. Extremities: No clubbing cyanosis or edema with positive pedal pulses. Neuro: Alert, awake, oriented x3, nonfocal.   Lab Results:  Basic Metabolic Panel:    Component Value Date/Time   NA 137 12/28/2011 0530   K 3.7 12/28/2011 0530   CL 98 12/28/2011 0530   CO2 30 12/28/2011 0530   BUN 27* 12/28/2011 0530   CREATININE 1.01 12/28/2011 0530   GLUCOSE 139* 12/28/2011 0530   CALCIUM 9.4 12/28/2011 0530   CBC:    Component Value Date/Time   WBC 7.8 12/28/2011 0530   HGB 8.6* 12/28/2011 0530   HCT 27.9* 12/28/2011 0530   PLT 210 12/28/2011 0530   MCV 89.1 12/28/2011 0530   NEUTROABS 8.0* 12/25/2011 0953   LYMPHSABS 0.7 12/25/2011 0953   MONOABS 0.9 12/25/2011 0953   EOSABS 0.1 12/25/2011 0953   BASOSABS 0.0 12/25/2011 0953    No results found for this or any previous visit (from the past 240 hour(s)).  Studies/Results: Dg Chest 1 View  12/28/2011  *RADIOLOGY REPORT*  Clinical Data: Pleural effusion  CHEST - 1 VIEW   Comparison: Yesterday  Findings: Cardiomegaly.  Pleural effusions stable.  Vascular congestion stable.   Horizontal linear opacities at the left base likely represents plate-like atelectasis.  No definite interstitial edema.  Mild bronchitic changes.  IMPRESSION: Stable pleural effusions, cardiomegaly, and vascular congestion.  Original Report Authenticated By: Donavan Burnet, M.D.   Dg Chest Port 1 View  12/27/2011  *RADIOLOGY REPORT*  Clinical Data: Follow up CHF and effusions.  PORTABLE CHEST - 1 VIEW 12/27/2011 1010 hours:  Comparison: Portable chest x-rays 12/25/2011, 12/12/2011 dating back to 12/10/2011, and two-view chest x-ray 12/09/2011.  Findings: Cardiac silhouette enlarged but stable.  Interval slight improvement in the interstitial pulmonary edema, though minimal interstitial edema persists.  Stable bilateral pleural effusions, left greater than right, and associated dense consolidation in the lower lobes.  Extensive mitral annular calcification again noted. No new pulmonary parenchymal abnormalities.  IMPRESSION: Interval slight improvement in CHF, though minimal interstitial edema persists.  Stable bilateral pleural effusions, right greater than left, with associated dense passive atelectasis and/or pneumonia in the lower lobes.  No new abnormalities.  Original Report Authenticated By: Arnell Sieving, M.D.    Medications: Scheduled Meds:   . aspirin  162 mg Oral Daily  . B-complex with vitamin C  1  tablet Oral Daily  . cholecalciferol  2,000 Units Oral Daily  . cloNIDine  0.1 mg Oral BID  . diltiazem  30 mg Oral Q8H  . ferrous sulfate  325 mg Oral TID WC  . furosemide  20 mg Intravenous BID  . gabapentin  600 mg Oral QHS  . insulin aspart  0-15 Units Subcutaneous TID WC  . insulin glargine  25 Units Subcutaneous QHS  . levofloxacin (LEVAQUIN) IV  250 mg Intravenous Q24H  . levofloxacin (LEVAQUIN) IV  500 mg Intravenous Q24H  . metoprolol tartrate  50 mg Oral QPC breakfast  .  metoprolol tartrate  25 mg Oral QPC supper  . omega-3 acid ethyl esters  1 g Oral Daily  . pantoprazole  40 mg Oral Q1200  . simvastatin  10 mg Oral q1800  . sodium chloride  3 mL Intravenous Q12H  . vitamin C  500 mg Oral Daily   Continuous Infusions:  PRN Meds:.sodium chloride, acetaminophen, diclofenac sodium, methocarbamol, ondansetron (ZOFRAN) IV, sodium chloride, traMADol, zolpidem, DISCONTD: metoprolol  Assessment/  78 y/o female with hx of DM, peripheral neuropathy, paroxysmal afib, moderate AS, bilateral non critical ICA stenosis was admitted with SOB, fatigue and dehydration. Medical consult following her regarding her DM and anemia.   Afib  Has been going in to RVR for past 2 days.   SOB improved. Also had a temp of 100.16F on 5/9 .  CXR shows improvement in CHF, not very convincing for an infiltrate. Have ordered UA. Possible for UTI  causing rapid Afib. Not a coumadin candidate due to fall risk and anemia  Cont tele monitoring  Increased dose of metoprolol yesterday and added cardizem   Fever:  patient had temp of 100.4 on 5/9 and fib with RVR. CXR  not convincing for infiltrate, UA positive for UTI. Urine cx sent and started on levofloxacin  Anemia:  Stool for occult blood negative  She informs having colonoscopy about 7-8 years back.  Iron panel suggests iron deficiency anemia. Hb unchanged from recent labs  B12 , TSH wnl  started on iron sulfate   Diabetes mellitus type 2  Continue lantus at current dose and SSI  Wanted to go back on her insulin pump, but as per office notes from her PCP she would not benefit from being on a pump due to tendency to overcorrect. Last A1C of only 6   Chest pain  likely secondary to her AS. Not a surgical candidate  Currently on BB,started Cardizem yesterday for better rate control High risk for decompensation   CHF  associated diastolic dysn with AS  Recent echo with good LV fn  Cont diuresis   severe PVD  Follows Dr Imogene Burn as  outpt  Cont neurontin   HTN  stable  Cont metoprolol and lasix    Primary team discussed with patient and her son about goals of care. Wish to be full code   [place]: to SNF as per primary team, either today or tomorrow   LOS: 3 days   Gerrica Cygan 12/28/2011, 1:42 PM

## 2011-12-28 NOTE — Telephone Encounter (Signed)
unfortunately can't see in hospital. they should book a follow up.

## 2011-12-28 NOTE — Progress Notes (Signed)
CSW spoke with patient and patient would like to accept the bed at [facility]( [place]).  CSW contacted the facility to inform them of the patient's decision.  CSW will continue to follow and assist with d/c needs. ]  Sabino Niemann, MSW, LCSWA [phone]

## 2011-12-28 NOTE — Progress Notes (Signed)
Name: Emma Yu MRN: [id number] DOB: Aug 18, 1927    LOS: 3  [place] / Critical Care Note   History of Present Illness:  78 y/o F, former smoker, with PMH of DM, HTN, HLD, PAD, Diastolic CHF, Afib, DJD, Moderate AS, Mild Mitral Stenosis, Anemia and significant CAD by cath 5/12 admitted to [facility] on 5/7 with complaints of dyspnea.  She was recently discharged 4/30 after recurrent falls with scalp laceration, Afib with RVR, renal failure and dehydration.  She was volume resuscitated during that admit and lasix held at time of discharge.  5/9 was being prepared for discharge and patient was noted to have desaturations on RA, mild leukocytosis and temp of 100.4.  PCCM consulted for hypoxemia.      Cultures: urine >>pending  Antibiotics: none   Tests / Events: 4/22 ECHO>>>Left ventricle: Wall thickness was increased in a patternof moderate LVH. Systolic function was vigorous. The estimated ejection fraction was in the range of 65% to 70%.Aortic valve: Moderately calcified annulus. There was moderate stenosis. Valve area: 3.22cm^2(VTI). Valve area:3.05cm^2 (Vmax). Mitral valve: The findings are consistent with mild stenosis.   Allergies Allergies  Allergen Reactions  . Carvedilol Other (See Comments)    Heart stops  . Diltiazem Hcl Other (See Comments)    Chest pain  . Fenofibrate Other (See Comments)    Unknown - [facility] - MAR  . Hctz (Hydrochlorothiazide) Other (See Comments)    Chest pain  . Nisoldipine Other (See Comments)    Chest pain  . Nitroglycerin Other (See Comments)    Unknown - [facility] MAR  . Propranolol Diarrhea    Chest pain  . Tekturna (Aliskiren Fumarate) Other (See Comments)    Unknown reaction  . Valturna (Aliskiren-Valsartan) Other (See Comments)    Unknown reaction   Improved dyspnea, did nt diurese much with lasix  Vital Signs: Temp:  [98 F (36.7 C)-100.3 F (37.9 C)] 98 F (36.7 C) (05/10 0445) Pulse Rate:  [101-138] 101  (05/10 1329) Resp:   [20] 20  (05/10 0445) BP: (117-143)/(59-80) 122/59 mmHg (05/10 1329) SpO2:  [94 %-96 %] 94 % (05/10 0445) Weight:  [63.05 kg (139 lb)] 63.05 kg (139 lb) (05/10 0445) I/O last 3 completed shifts: In: 1102 [P.O.:1080; I.V.:12; IV Piggyback:10] Out: 655 [Urine:655]  Physical Examination: General: elderly female in NAD Neuro: AAOx4, MAE, generalized weakness CV: s1s2 irregularly irregular classic sys murm rad carotid 4/6 PULM: resp's even/non-labored, lungs bilaterally with posterior crackles GI: round/ soft, bsx4 active Extremities: warm/dry, no edema  Labs    CBC  Lab 12/28/11 0530 12/26/11 0554 12/25/11 0953  HGB 8.6* 9.0* 8.2*  HCT 27.9* 28.9* 26.5*  WBC 7.8 11.1* 9.7  PLT 210 309 281     BMET  Lab 12/28/11 0530 12/26/11 0554 12/25/11 1639 12/25/11 0953  NA 137 138 -- 139  K 3.7 4.3 -- --  CL 98 101 -- 106  CO2 30 27 -- 25  GLUCOSE 139* 155* -- 182*  BUN 27* 31* -- 26*  CREATININE 1.01 1.18* -- 1.03  CALCIUM 9.4 10.1 -- 9.7  MG -- -- 1.8 --  PHOS -- -- -- --     Lab 12/25/11 0953  INR 1.18    No results found for this basename: PHART:5,PCO2:5,PCO2ART:5,PO2:5,PO2ART:5,HCO3:5,TCO2:5,O2SAT:5 in the last 168 hours   Radiology: 5/10 CXR>>> Stable bilateral pleural effusions, right greater than left  Assessment and Plan:  Dyspnea / Hypoxemia Assessment: Multifactorial hypoxemia & dyspnea in the setting of Afib with RVR, CHF, likely  some degree of COPD given smoking Hx, and bilateral effusions.  Difficult to diurese in the setting of AS -recent admit with renal failure and dehydration.  Was discharged home off lasix.  Relative volume up and mild increase in renal failure.  Mild leukocytosis, tmax of 100.4 (likely related to UTI).     Plan: -on levaquni, low pct good sign, fu urine cx -continue stable diuresis, caution with moderate AS with volume dependence -, doubt need for therapeutic thoracentesis if effusions not responsive to diuresis -f/u cxr next  wk -diuresis as renal fxn and BP permit -no bronchospasm, no indication for steroids -decide on need for home O2 on dc PCCM to sign off  Irie Fiorello V.   12/28/2011, 2:35 PM

## 2011-12-28 NOTE — Progress Notes (Signed)
Patient ID: Emma Yu, female   DOB: Sep 02, 1926, 78 y.o.   MRN: [id number]    Subjective:  Still wants to go home.  Desat with ambulation yesterday with low grade fever and UTI  Objective:  Filed Vitals:   12/27/11 1359 12/27/11 1728 12/27/11 2033 12/28/11 0445  BP: 126/72 142/80 143/64 117/65  Pulse: 124 137 133 137  Temp: 98 F (36.7 C)  100.3 F (37.9 C) 98 F (36.7 C)  TempSrc: Oral  Oral Oral  Resp: 22  20 20   Height:      Weight:    63.05 kg (139 lb)  SpO2: 95%  96% 94%    Intake/Output from previous day:  Intake/Output Summary (Last 24 hours) at 12/28/11 0911 Last data filed at 12/28/11 0518  Gross per 24 hour  Intake    850 ml  Output    655 ml  Net    195 ml    Physical Exam: Affect appropriate Chronically ill elderly female HEENT: normal Neck supple with no adenopathy JVP normal bilateral  bruits no thyromegaly Lungs decrease BS bases  no wheezing and good diaphragmatic motion Heart:  S1/S2 AS murmur, no rub, gallop or click PMI normal Abdomen: benighn, BS positve, no tenderness, no AAA no bruit.  No HSM or HJR Distal pulses intact with no bruits No edema Neuro non-focal Marked UE tremors intention Skin warm and dry No muscular weakness   Lab Results: Basic Metabolic Panel:  Basename 12/28/11 0530 12/26/11 0554 12/25/11 1639  NA 137 138 --  K 3.7 4.3 --  CL 98 101 --  CO2 30 27 --  GLUCOSE 139* 155* --  BUN 27* 31* --  CREATININE 1.01 1.18* --  CALCIUM 9.4 10.1 --  MG -- -- 1.8  PHOS -- -- --   Liver Function Tests: No results found for this basename: AST:2,ALT:2,ALKPHOS:2,BILITOT:2,PROT:2,ALBUMIN:2 in the last 72 hours No results found for this basename: LIPASE:2,AMYLASE:2 in the last 72 hours CBC:  Basename 12/28/11 0530 12/26/11 0554 12/25/11 0953  WBC 7.8 11.1* --  NEUTROABS -- -- 8.0*  HGB 8.6* 9.0* --  HCT 27.9* 28.9* --  MCV 89.1 92.0 --  PLT 210 309 --   Anemia Panel:  Basename 12/25/11 1639  VITAMINB12 894    FOLATE >20.0  FERRITIN 45  TIBC 316  IRON 19*  RETICCTPCT 3.9*    Imaging: Dg Chest 1 View  12/28/2011  *RADIOLOGY REPORT*  Clinical Data: Pleural effusion  CHEST - 1 VIEW  Comparison: Yesterday  Findings: Cardiomegaly.  Pleural effusions stable.  Vascular congestion stable.   Horizontal linear opacities at the left base likely represents plate-like atelectasis.  No definite interstitial edema.  Mild bronchitic changes.  IMPRESSION: Stable pleural effusions, cardiomegaly, and vascular congestion.  Original Report Authenticated By: Donavan Burnet, M.D.   Dg Chest Port 1 View  12/27/2011  *RADIOLOGY REPORT*  Clinical Data: Follow up CHF and effusions.  PORTABLE CHEST - 1 VIEW 12/27/2011 1010 hours:  Comparison: Portable chest x-rays 12/25/2011, 12/12/2011 dating back to 12/10/2011, and two-view chest x-ray 12/09/2011.  Findings: Cardiac silhouette enlarged but stable.  Interval slight improvement in the interstitial pulmonary edema, though minimal interstitial edema persists.  Stable bilateral pleural effusions, left greater than right, and associated dense consolidation in the lower lobes.  Extensive mitral annular calcification again noted. No new pulmonary parenchymal abnormalities.  IMPRESSION: Interval slight improvement in CHF, though minimal interstitial edema persists.  Stable bilateral pleural effusions, right greater than left, with associated  dense passive atelectasis and/or pneumonia in the lower lobes.  No new abnormalities.  Original Report Authenticated By: Arnell Sieving, M.D.    Cardiac Studies:  ECG: Afib poor R wave progression  No acute ischemic changes   Telemetry: Afib rates better controlled    Medications:      . aspirin  162 mg Oral Daily  . B-complex with vitamin C  1 tablet Oral Daily  . cholecalciferol  2,000 Units Oral Daily  . cloNIDine  0.1 mg Oral BID  . diltiazem  30 mg Oral Q8H  . ferrous sulfate  325 mg Oral TID WC  . furosemide  20 mg Intravenous  BID  . gabapentin  600 mg Oral QHS  . insulin aspart  0-15 Units Subcutaneous TID WC  . insulin glargine  25 Units Subcutaneous QHS  . levofloxacin (LEVAQUIN) IV  250 mg Intravenous Q24H  . levofloxacin (LEVAQUIN) IV  500 mg Intravenous Q24H  . metoprolol tartrate  50 mg Oral QPC breakfast  . metoprolol tartrate  25 mg Oral QPC supper  . metoprolol tartrate  25 mg Oral Once  . omega-3 acid ethyl esters  1 g Oral Daily  . pantoprazole  40 mg Oral Q1200  . simvastatin  10 mg Oral q1800  . sodium chloride  3 mL Intravenous Q12H  . vitamin C  500 mg Oral Daily  . DISCONTD: metoprolol tartrate  25 mg Oral BID       Assessment/Plan:  CHF: minimal diuresis but CXR better I think poor oxgyenation is multifactorial Appreciate pulmonary help Afib:  Increase lopresser to 50 am and 25 pm  Cardizem added yesterday improved.  No coumadin due to age Frequent falls and anemia DM:  F/U Dr Everardo All  next week  Per hospitalist not going back on insulin pump Anemia:  guaic stools per primary MD UTI: started on levaquin yesterday  Placement:  Was at [facility] before admission but apparantly will need SNF Did discuss DNR status with son and patient on admission and she is a full code  D/C to home latter today if CXR looks ok  I did discuss DNR status on admission with patient and son and she wishes to be a full code   Charlton Haws 12/28/2011, 9:11 AM

## 2011-12-28 NOTE — Progress Notes (Signed)
Clinical Social Work Department CLINICAL SOCIAL WORK PLACEMENT NOTE 12/28/2011  Patient:  Emma Yu, Emma Yu  Account Number:  [id number] Admit date:  12/25/2011  Clinical Social Worker:  Bari Leib Lubertha Basque  Date/time:  12/28/2011 08:00 AM  Clinical Social Work is seeking post-discharge placement for this patient at the following level of care:   SKILLED NURSING   (*CSW will update this form in Epic as items are completed)   12/28/2011  Patient/family provided with [facility] Department of Clinical Social Work's list of facilities offering this level of care within the geographic area requested by the patient (or if unable, by the patient's family).  12/28/2011  Patient/family informed of their freedom to choose among providers that offer the needed level of care, that participate in Medicare, Medicaid or managed care program needed by the patient, have an available bed and are willing to accept the patient.  12/28/2011  Patient/family informed of [facility]' ownership interest in [facility], as well as of the fact that they are under no obligation to receive care at this facility.  PASARR submitted to EDS on  PASARR number received from EDS on   FL2 transmitted to all facilities in geographic area requested by pt/family on  12/28/2011 FL2 transmitted to all facilities within larger geographic area on   Patient informed that his/her managed care company has contracts with or will negotiate with  certain facilities, including the following:     Patient/family informed of bed offers received:  12/29/2011 Patient chooses bed at [facility], [place] Physician recommends and patient [facility] [place]  Patient to be transferred to  on   Patient to be transferred to facility by   The following physician request were entered in Epic:   Additional Comments:  Sabino Niemann, MSW, LCSWA [phone]

## 2011-12-29 DIAGNOSIS — I509 Heart failure, unspecified: Secondary | ICD-10-CM

## 2011-12-29 DIAGNOSIS — E782 Mixed hyperlipidemia: Secondary | ICD-10-CM

## 2011-12-29 DIAGNOSIS — I4891 Unspecified atrial fibrillation: Secondary | ICD-10-CM

## 2011-12-29 DIAGNOSIS — R112 Nausea with vomiting, unspecified: Secondary | ICD-10-CM

## 2011-12-29 LAB — CBC
Hemoglobin: 8.5 g/dL — ABNORMAL LOW (ref 12.0–15.0)
MCV: 87.4 fL (ref 78.0–100.0)
Platelets: 228 10*3/uL (ref 150–400)
RBC: 3.25 MIL/uL — ABNORMAL LOW (ref 3.87–5.11)
WBC: 5.5 10*3/uL (ref 4.0–10.5)

## 2011-12-29 LAB — GLUCOSE, CAPILLARY
Glucose-Capillary: 154 mg/dL — ABNORMAL HIGH (ref 70–99)
Glucose-Capillary: 150 mg/dL — ABNORMAL HIGH (ref 70–99)

## 2011-12-29 LAB — PROCALCITONIN: Procalcitonin: 0.2 ng/mL

## 2011-12-29 MED ORDER — FUROSEMIDE 20 MG PO TABS
20.0000 mg | ORAL_TABLET | Freq: Two times a day (BID) | ORAL | Status: DC
  Administered 2011-12-29 – 2011-12-31 (×4): 20 mg via ORAL
  Filled 2011-12-29 (×7): qty 1

## 2011-12-29 NOTE — Progress Notes (Signed)
Subjective: patient seen and examined today. Feels better overall. Remains afebrile and HR better controlled this am.  Objective:  Vital signs in last 24 hours:  Filed Vitals:   12/28/11 2314 12/29/11 0545 12/29/11 1003 12/29/11 1341  BP: 123/49 154/69 153/74 121/61  Pulse: 84 126 123 92  Temp: 98.3 F (36.8 C) 97.8 F (36.6 C)  98.1 F (36.7 C)  TempSrc: Oral Oral  Oral  Resp: 18 20  18   Height:      Weight:  62.642 kg (138 lb 1.6 oz)    SpO2: 98% 95%  95%    Intake/Output from previous day:   Intake/Output Summary (Last 24 hours) at 12/29/11 1344 Last data filed at 12/29/11 1300  Gross per 24 hour  Intake    734 ml  Output   1250 ml  Net   -516 ml    Physical Exam:  General: elderly female in no acute distress.  HEENT: no pallor, no icterus, moist oral mucosa, no JVD, no lymphadenopathy  Heart: s1&s2 irregularly irregularly, without murmurs, rubs, gallops.  Lungs: Clear to auscultation bilaterally.  Abdomen: Soft, nontender, nondistended, positive bowel sounds.  Extremities: No clubbing cyanosis or edema with positive pedal pulses.  Neuro: Alert, awake, oriented x3, nonfocal.   Lab Results:  Basic Metabolic Panel:    Component Value Date/Time   NA 137 12/28/2011 0530   K 3.7 12/28/2011 0530   CL 98 12/28/2011 0530   CO2 30 12/28/2011 0530   BUN 27* 12/28/2011 0530   CREATININE 1.01 12/28/2011 0530   GLUCOSE 139* 12/28/2011 0530   CALCIUM 9.4 12/28/2011 0530   CBC:    Component Value Date/Time   WBC 7.8 12/28/2011 0530   HGB 8.6* 12/28/2011 0530   HCT 27.9* 12/28/2011 0530   PLT 210 12/28/2011 0530   MCV 89.1 12/28/2011 0530   NEUTROABS 8.0* 12/25/2011 0953   LYMPHSABS 0.7 12/25/2011 0953   MONOABS 0.9 12/25/2011 0953   EOSABS 0.1 12/25/2011 0953   BASOSABS 0.0 12/25/2011 0953    Recent Results (from the past 240 hour(s))  URINE CULTURE     Status: Normal (Preliminary result)   Collection Time   12/27/11  2:43 PM      Component Value Range Status Comment   Specimen Description URINE, CLEAN CATCH   Final    Special Requests ADDED ON [id number] @1748    Final    Culture  Setup Time [id number]   Final    Colony Count >=100,000 COLONIES/ML   Final    Culture GRAM NEGATIVE RODS   Final    Report Status PENDING   Incomplete     Studies/Results: Dg Chest 1 View  12/28/2011  *RADIOLOGY REPORT*  Clinical Data: Pleural effusion  CHEST - 1 VIEW  Comparison: Yesterday  Findings: Cardiomegaly.  Pleural effusions stable.  Vascular congestion stable.   Horizontal linear opacities at the left base likely represents plate-like atelectasis.  No definite interstitial edema.  Mild bronchitic changes.  IMPRESSION: Stable pleural effusions, cardiomegaly, and vascular congestion.  Original Report Authenticated By: Donavan Burnet, M.D.    Medications: Scheduled Meds:   . aspirin  162 mg Oral Daily  . B-complex with vitamin C  1 tablet Oral Daily  . cholecalciferol  2,000 Units Oral Daily  . cloNIDine  0.1 mg Oral BID  . diltiazem  30 mg Oral Q8H  . ferrous sulfate  325 mg Oral TID WC  . furosemide  20 mg Oral BID  . gabapentin  600  mg Oral QHS  . insulin aspart  0-15 Units Subcutaneous TID WC  . insulin glargine  25 Units Subcutaneous QHS  . levofloxacin (LEVAQUIN) IV  250 mg Intravenous Q24H  . metoprolol tartrate  50 mg Oral QPC breakfast  . metoprolol tartrate  25 mg Oral QPC supper  . omega-3 acid ethyl esters  1 g Oral Daily  . pantoprazole  40 mg Oral Q1200  . simvastatin  10 mg Oral q1800  . sodium chloride  3 mL Intravenous Q12H  . vitamin C  500 mg Oral Daily  . DISCONTD: furosemide  20 mg Intravenous BID   Continuous Infusions:  PRN Meds:.sodium chloride, acetaminophen, diclofenac sodium, methocarbamol, ondansetron (ZOFRAN) IV, sodium chloride, traMADol, zolpidem   Assessment/  78 y/o female with hx of DM, peripheral neuropathy, paroxysmal afib, moderate AS, bilateral non critical ICA stenosis was admitted with SOB, fatigue and dehydration.  Medical consult following her regarding her DM and anemia.   Afib  Patient went in to RVR for 2 days. SOB improved. Also had a temp of 100.83F on 5/9 . CXR shows improvement in CHF, not very convincing for an infiltrate. UA Possible for UTI . Final cx pending. Recently had ecoli UTI  Not a coumadin candidate due to fall risk and anemia  Cont tele monitoring  Increased dose of metoprolol and added cardizem , hr BETTER NOW  Fever:  patient had temp of 100.4 on 5/9 and fib with RVR. CXR not convincing for infiltrate, UA positive for UTI. Urine cx pending. Recent ecoi UTI.  and started on levofloxacin   Anemia:  Stool for occult blood negative  She informs having colonoscopy about 7-8 years back.  Iron panel suggests iron deficiency anemia. Hb unchanged from recent labs  B12 , TSH wnl  started on iron sulfate   Diabetes mellitus type 2  Continue lantus at current dose and SSI  Wanted to go back on her insulin pump, but as per office notes from her PCP she would not benefit from being on a pump due to tendency to overcorrect. Last A1C of only 6   Chest pain  likely secondary to her AS. Not a surgical candidate  Currently on BB,started Cardizem yesterday for better rate control  High risk for decompensation   CHF  associated diastolic dysn with AS  Recent echo with good LV fn  Cont diuresis   severe PVD  Follows Dr Imogene Burn as outpt  Cont neurontin   HTN  stable  Cont metoprolol and lasix   Primary team discussed with patient and her son about goals of care. Wish to be full code   Dispo: to SNF per primary team      LOS: 4 days   Krisalyn Yankowski 12/29/2011, 1:44 PM

## 2011-12-29 NOTE — Progress Notes (Signed)
Subjective:  Denies chest pain or dyspnea.  Wearing nasal O2. Denies hematochezia.  Stools dark from iron.  Objective:  Vital Signs in the last 24 hours: Temp:  [97.5 F (36.4 C)-98.4 F (36.9 C)] 97.8 F (36.6 C) (05/11 0545) Pulse Rate:  [84-126] 123  (05/11 1003) Resp:  [18-20] 20  (05/11 0545) BP: (108-154)/(39-74) 153/74 mmHg (05/11 1003) SpO2:  [95 %-100 %] 95 % (05/11 0545) Weight:  [62.642 kg (138 lb 1.6 oz)] 62.642 kg (138 lb 1.6 oz) (05/11 0545)  Intake/Output from previous day: 05/10 0701 - 05/11 0700 In: 1014 [P.O.:960; IV Piggyback:54] Out: 1000 [Urine:1000] Intake/Output from this shift: Total I/O In: 220 [P.O.:220] Out: 500 [Urine:500]     . aspirin  162 mg Oral Daily  . B-complex with vitamin C  1 tablet Oral Daily  . cholecalciferol  2,000 Units Oral Daily  . cloNIDine  0.1 mg Oral BID  . diltiazem  30 mg Oral Q8H  . ferrous sulfate  325 mg Oral TID WC  . furosemide  20 mg Intravenous BID  . gabapentin  600 mg Oral QHS  . insulin aspart  0-15 Units Subcutaneous TID WC  . insulin glargine  25 Units Subcutaneous QHS  . levofloxacin (LEVAQUIN) IV  250 mg Intravenous Q24H  . metoprolol tartrate  50 mg Oral QPC breakfast  . metoprolol tartrate  25 mg Oral QPC supper  . omega-3 acid ethyl esters  1 g Oral Daily  . pantoprazole  40 mg Oral Q1200  . simvastatin  10 mg Oral q1800  . sodium chloride  3 mL Intravenous Q12H  . vitamin C  500 mg Oral Daily      Physical Exam: The patient appears to be in no distress.  Head and neck exam reveals that the pupils are equal and reactive.  The extraocular movements are full.  There is no scleral icterus.  Mouth and pharynx are benign.  No lymphadenopathy.  No carotid bruits.  The jugular venous pressure is normal.  Thyroid is not enlarged or tender.  Chest is clear to percussion and auscultation.  No rales or rhonchi.  Expansion of the chest is symmetrical.  Heart reveals no abnormal lift or heave.  First and  second heart sounds are normal.  There is no  gallop rub or click. Gr 2/6 AS murmur.  The abdomen is soft and nontender.  Bowel sounds are normoactive.  There is no hepatosplenomegaly or mass.  There are no abdominal bruits.  Extremities reveal no phlebitis or edema.  Pedal pulses are good.  There is no cyanosis or clubbing.  Neurologic exam is normal strength and no lateralizing weakness.  No sensory deficits.  Integument reveals no rash  Lab Results:  Basename 12/28/11 0530  WBC 7.8  HGB 8.6*  PLT 210    Basename 12/28/11 0530  NA 137  K 3.7  CL 98  CO2 30  GLUCOSE 139*  BUN 27*  CREATININE 1.01   No results found for this basename: TROPONINI:2,CK,MB:2 in the last 72 hours Hepatic Function Panel No results found for this basename: PROT,ALBUMIN,AST,ALT,ALKPHOS,BILITOT,BILIDIR,IBILI in the last 72 hours No results found for this basename: CHOL in the last 72 hours No results found for this basename: PROTIME in the last 72 hours  Imaging: Dg Chest 1 View  12/28/2011  *RADIOLOGY REPORT*  Clinical Data: Pleural effusion  CHEST - 1 VIEW  Comparison: Yesterday  Findings: Cardiomegaly.  Pleural effusions stable.  Vascular congestion stable.   Horizontal  linear opacities at the left base likely represents plate-like atelectasis.  No definite interstitial edema.  Mild bronchitic changes.  IMPRESSION: Stable pleural effusions, cardiomegaly, and vascular congestion.  Original Report Authenticated By: Donavan Burnet, M.D.    Cardiac Studies:  Assessment/Plan:  Patient Active Hospital Problem List: Pleural effusion (12/28/2011)   Assessment: Stable by chest xray yesterday.   Plan: Change lasix to po route Anemia iron deficient. Hgb lower yesterday.    Plan: Follow up CBC  Anticipate probable transfer to [facility] Monday if stable.      LOS: 4 days    Cassell Clement 12/29/2011, 12:19 PM

## 2011-12-30 DIAGNOSIS — J90 Pleural effusion, not elsewhere classified: Secondary | ICD-10-CM

## 2011-12-30 DIAGNOSIS — I4891 Unspecified atrial fibrillation: Secondary | ICD-10-CM

## 2011-12-30 DIAGNOSIS — I509 Heart failure, unspecified: Secondary | ICD-10-CM

## 2011-12-30 DIAGNOSIS — R112 Nausea with vomiting, unspecified: Secondary | ICD-10-CM

## 2011-12-30 DIAGNOSIS — E782 Mixed hyperlipidemia: Secondary | ICD-10-CM

## 2011-12-30 DIAGNOSIS — I359 Nonrheumatic aortic valve disorder, unspecified: Secondary | ICD-10-CM

## 2011-12-30 LAB — CBC
HCT: 26.6 % — ABNORMAL LOW (ref 36.0–46.0)
Hemoglobin: 8.2 g/dL — ABNORMAL LOW (ref 12.0–15.0)
MCHC: 30.8 g/dL (ref 30.0–36.0)
MCV: 87.5 fL (ref 78.0–100.0)
WBC: 4.9 10*3/uL (ref 4.0–10.5)

## 2011-12-30 LAB — BASIC METABOLIC PANEL
BUN: 33 mg/dL — ABNORMAL HIGH (ref 6–23)
Chloride: 99 mEq/L (ref 96–112)
Glucose, Bld: 175 mg/dL — ABNORMAL HIGH (ref 70–99)
Potassium: 3.7 mEq/L (ref 3.5–5.1)

## 2011-12-30 LAB — GLUCOSE, CAPILLARY
Glucose-Capillary: 159 mg/dL — ABNORMAL HIGH (ref 70–99)
Glucose-Capillary: 229 mg/dL — ABNORMAL HIGH (ref 70–99)
Glucose-Capillary: 325 mg/dL — ABNORMAL HIGH (ref 70–99)

## 2011-12-30 MED ORDER — LEVOFLOXACIN 250 MG PO TABS
250.0000 mg | ORAL_TABLET | ORAL | Status: DC
  Administered 2011-12-30: 250 mg via ORAL
  Filled 2011-12-30 (×2): qty 1

## 2011-12-30 MED ORDER — DILTIAZEM HCL ER COATED BEADS 240 MG PO CP24
240.0000 mg | ORAL_CAPSULE | Freq: Every day | ORAL | Status: DC
  Administered 2011-12-30 – 2011-12-31 (×2): 240 mg via ORAL
  Filled 2011-12-30 (×2): qty 1

## 2011-12-30 NOTE — Progress Notes (Signed)
   Subjective:   Feels good. Breathing better. Eager to go. AF rate up on tele this am with rates up to 140.   Objective:  Vital Signs in the last 24 hours: Temp:  [98.1 F (36.7 C)-98.6 F (37 C)] 98.6 F (37 C) (05/12 0434) Pulse Rate:  [91-107] 91  (05/12 0434) Resp:  [18-20] 20  (05/12 0434) BP: (121-148)/(61-74) 145/73 mmHg (05/12 0434) SpO2:  [94 %-95 %] 94 % (05/12 0434) Weight:  [62.8 kg (138 lb 7.2 oz)] 62.8 kg (138 lb 7.2 oz) (05/12 0434)  Intake/Output from previous day: 05/11 0701 - 05/12 0700 In: 890 [P.O.:840; IV Piggyback:50] Out: 1650 [Urine:1650] Intake/Output from this shift: Total I/O In: 240 [P.O.:240] Out: 350 [Urine:350]     . aspirin  162 mg Oral Daily  . B-complex with vitamin C  1 tablet Oral Daily  . cholecalciferol  2,000 Units Oral Daily  . cloNIDine  0.1 mg Oral BID  . diltiazem  30 mg Oral Q8H  . ferrous sulfate  325 mg Oral TID WC  . furosemide  20 mg Oral BID  . gabapentin  600 mg Oral QHS  . insulin aspart  0-15 Units Subcutaneous TID WC  . insulin glargine  25 Units Subcutaneous QHS  . levofloxacin  250 mg Oral Q24H  . metoprolol tartrate  50 mg Oral QPC breakfast  . metoprolol tartrate  25 mg Oral QPC supper  . omega-3 acid ethyl esters  1 g Oral Daily  . pantoprazole  40 mg Oral Q1200  . simvastatin  10 mg Oral q1800  . sodium chloride  3 mL Intravenous Q12H  . vitamin C  500 mg Oral Daily  . DISCONTD: furosemide  20 mg Intravenous BID  . DISCONTD: levofloxacin (LEVAQUIN) IV  250 mg Intravenous Q24H      Physical Exam: Elderly.  no distress. HEENT normal Neck no jvd. Carotids 2+ with bilat radiated bruits Lungs: CTA Heart: Tachy. IRR 2/6 AS murmur. Abdomen is soft and nontender.  Bowel sounds are normoactive.  There is no hepatosplenomegaly or mass.  There are no abdominal bruits. Extremities no C/C. Tr edema. + venous stasis dermatitis Neurologic exam is normal strength and no lateralizing weakness.  No sensory  deficits.  Lab Results:  Basename 12/30/11 0508 12/29/11 1352  WBC 4.9 5.5  HGB 8.2* 8.5*  PLT 238 228    Basename 12/30/11 0508 12/28/11 0530  NA 140 137  K 3.7 3.7  CL 99 98  CO2 31 30  GLUCOSE 175* 139*  BUN 33* 27*  CREATININE 0.84 1.01   No results found for this basename: TROPONINI:2,CK,MB:2 in the last 72 hours Hepatic Function Panel No results found for this basename: PROT,ALBUMIN,AST,ALT,ALKPHOS,BILITOT,BILIDIR,IBILI in the last 72 hours No results found for this basename: CHOL in the last 72 hours No results found for this basename: PROTIME in the last 72 hours  Imaging: No results found.  Cardiac Studies:  Assessment:  1. AF with RVR - not coumadin candidate due to falls. Cardizem added 5/11 2. Pleural effusion 3. Moderate AS 4. A/c diastolic HF 5. Acute respiratory failure - improved 6. UTI 7. CAD 8. Iron def anemia 9. DM2  Plan:  HR remains elevated will increase diltiazem. Otherwise looks good. Hopefully to [facility] tomorrow.    LOS: 5 days    Arvilla Meres 12/30/2011, 10:15 AM

## 2011-12-30 NOTE — Progress Notes (Signed)
Subjective: Patient seen and examined this morning. Feels good and cheerful wanting to be discharged. Occasionally tachy on monitor  Objective:  Vital signs in last 24 hours:  Filed Vitals:   12/29/11 1003 12/29/11 1341 12/29/11 2252 12/30/11 0434  BP: 153/74 121/61 148/74 145/73  Pulse: 123 92 107 91  Temp:  98.1 F (36.7 C) 98.6 F (37 C) 98.6 F (37 C)  TempSrc:  Oral Oral Oral  Resp:  18 18 20   Height:      Weight:    62.8 kg (138 lb 7.2 oz)  SpO2:  95% 95% 94%    Intake/Output from previous day:   Intake/Output Summary (Last 24 hours) at 12/30/11 1610 Last data filed at 12/30/11 0900  Gross per 24 hour  Intake   1130 ml  Output   2000 ml  Net   -870 ml    Physical Exam:  General: elderly female in no acute distress.  HEENT: no pallor, no icterus, moist oral mucosa, no JVD, no lymphadenopathy  Heart: s1&s2 irregularly irregularly, without murmurs, rubs, gallops.  Lungs: Clear to auscultation bilaterally.  Abdomen: Soft, nontender, nondistended, positive bowel sounds.  Extremities: No clubbing cyanosis or edema with positive pedal pulses.  Neuro: Alert, awake, oriented x3, nonfocal.    Lab Results:  Basic Metabolic Panel:    Component Value Date/Time   NA 140 12/30/2011 0508   K 3.7 12/30/2011 0508   CL 99 12/30/2011 0508   CO2 31 12/30/2011 0508   BUN 33* 12/30/2011 0508   CREATININE 0.84 12/30/2011 0508   GLUCOSE 175* 12/30/2011 0508   CALCIUM 10.1 12/30/2011 0508   CBC:    Component Value Date/Time   WBC 4.9 12/30/2011 0508   HGB 8.2* 12/30/2011 0508   HCT 26.6* 12/30/2011 0508   PLT 238 12/30/2011 0508   MCV 87.5 12/30/2011 0508   NEUTROABS 8.0* 12/25/2011 0953   LYMPHSABS 0.7 12/25/2011 0953   MONOABS 0.9 12/25/2011 0953   EOSABS 0.1 12/25/2011 0953   BASOSABS 0.0 12/25/2011 0953    Recent Results (from the past 240 hour(s))  URINE CULTURE     Status: Normal (Preliminary result)   Collection Time   12/27/11  2:43 PM      Component Value Range Status  Comment   Specimen Description URINE, CLEAN CATCH   Final    Special Requests ADDED ON [id number] @1748    Final    Culture  Setup Time [id number]   Final    Colony Count >=100,000 COLONIES/ML   Final    Culture GRAM NEGATIVE RODS   Final    Report Status PENDING   Incomplete     Studies/Results: No results found.  Medications: Scheduled Meds:   . aspirin  162 mg Oral Daily  . B-complex with vitamin C  1 tablet Oral Daily  . cholecalciferol  2,000 Units Oral Daily  . cloNIDine  0.1 mg Oral BID  . diltiazem  30 mg Oral Q8H  . ferrous sulfate  325 mg Oral TID WC  . furosemide  20 mg Oral BID  . gabapentin  600 mg Oral QHS  . insulin aspart  0-15 Units Subcutaneous TID WC  . insulin glargine  25 Units Subcutaneous QHS  . levofloxacin  250 mg Oral Q24H  . metoprolol tartrate  50 mg Oral QPC breakfast  . metoprolol tartrate  25 mg Oral QPC supper  . omega-3 acid ethyl esters  1 g Oral Daily  . pantoprazole  40 mg Oral Q1200  .  simvastatin  10 mg Oral q1800  . sodium chloride  3 mL Intravenous Q12H  . vitamin C  500 mg Oral Daily  . DISCONTD: furosemide  20 mg Intravenous BID  . DISCONTD: levofloxacin (LEVAQUIN) IV  250 mg Intravenous Q24H   Continuous Infusions:  PRN Meds:.sodium chloride, acetaminophen, diclofenac sodium, methocarbamol, ondansetron (ZOFRAN) IV, sodium chloride, traMADol, zolpidem  Assessment/Plan:  Assessment/  78 y/o female with hx of DM, peripheral neuropathy, paroxysmal afib, moderate AS, bilateral non critical ICA stenosis was admitted with SOB, fatigue and dehydration. Medical consult following her regarding her DM and anemia.   Afib  Patient went in to RVR for 2 days. SOB improved. Also had a temp of 100.53F on 5/9 . CXR shows improvement in CHF, not very convincing for an infiltrate. UA Possible for UTI . Final cx pending.  Recently had ecoli UTI  Not a coumadin candidate due to fall risk and anemia  Cont tele monitoring  Increased dose of metoprolol  and added cardizem ,HR improved  Fever:  patient had temp of 100.4 on 5/9 and fib with RVR. CXR not convincing for infiltrate, UA positive for UTI. Urine cx pending. Recent ecoi UTI.  and started on levofloxacin ( day 3 ). Final cx and sensitivity pending. Can d/c levoquin after dose today  Anemia:  Stool for occult blood negative  She informs having colonoscopy about 7-8 years back.  Iron panel suggests iron deficiency anemia. Hb unchanged from recent labs  B12 , TSH wnl  started on iron sulfate  Would benefit from GI eval as outpatient  Diabetes mellitus type 2  Continue lantus at current dose and SSI  Wanted to go back on her insulin pump, but as per office notes from her PCP she would not benefit from being on a pump due to tendency to overcorrect. Last A1C of only 6   Chest pain  likely secondary to her AS. Not a surgical candidate  Currently on BB,started Cardizem yesterday for better rate control  High risk for decompensation   CHF  associated diastolic dysn with AS  Recent echo with good LV fn  Cont diuresis   severe PVD  Follows Dr Imogene Burn as outpt  Cont neurontin   HTN  stable  Cont metoprolol and lasix   Primary team discussed with patient and her son about goals of care. Wish to be full code   Dispo: to SNF per primary team    hospitalist will sign off . Please call for any questions.  Thank you for allowing [place] to participate in patient's care    LOS: 5 days   Emma Yu 12/30/2011, 9:52 AM

## 2011-12-30 NOTE — Progress Notes (Signed)
PHARMACIST - PHYSICIAN COMMUNICATION CONCERNING: Antibiotic IV to Oral Route Change Policy  RECOMMENDATION: This patient is receiving Levaquin by the intravenous route.  Based on criteria approved by the Pharmacy and Therapeutics Committee, Levaquin has been converted to the oral tablet formulation.  DESCRIPTION: These criteria include:  Patient being treated for a respiratory tract infection, urinary tract infection, or cellulitis  The patient is not neutropenic and does not exhibit a GI malabsorption state  The patient is eating (either orally or via tube) and/or has been taking other orally administered medications for a least 24 hours  The patient is improving clinically and has a Tmax < 100.5  Benjaman Pott, PharmD     Pager [phone] 12/30/2011   9:41 AM

## 2011-12-31 ENCOUNTER — Encounter (HOSPITAL_COMMUNITY): Payer: Self-pay | Admitting: Physician Assistant

## 2011-12-31 LAB — BASIC METABOLIC PANEL
Chloride: 100 mEq/L (ref 96–112)
GFR calc Af Amer: 68 mL/min — ABNORMAL LOW (ref >90)
Potassium: 4 mEq/L (ref 3.5–5.1)
Sodium: 139 mEq/L (ref 135–145)

## 2011-12-31 LAB — CBC
Platelets: 265 10*3/uL (ref 150–400)
RDW: 16 % — ABNORMAL HIGH (ref 11.5–15.5)
WBC: 5.8 10*3/uL (ref 4.0–10.5)

## 2011-12-31 LAB — GLUCOSE, CAPILLARY

## 2011-12-31 MED ORDER — DILTIAZEM HCL ER COATED BEADS 240 MG PO CP24: 240.0000 mg | ORAL_CAPSULE | Freq: Every day | ORAL | Status: DC

## 2011-12-31 MED ORDER — LEVOFLOXACIN 250 MG PO TABS: 250.0000 mg | ORAL_TABLET | ORAL | Status: DC

## 2011-12-31 NOTE — Progress Notes (Signed)
   Subjective:   Denies chest pain or SOB  Objective:  Vital Signs in the last 24 hours: Temp:  [97.2 F (36.2 C)-98.4 F (36.9 C)] 97.2 F (36.2 C) (05/13 0526) Pulse Rate:  [81-105] 105  (05/13 0526) Resp:  [18] 18  (05/13 0526) BP: (106-151)/(42-74) 151/68 mmHg (05/13 0526) SpO2:  [97 %] 97 % (05/13 0526) Weight:  [62.5 kg (137 lb 12.6 oz)] 62.5 kg (137 lb 12.6 oz) (05/13 0526)  Intake/Output from previous day: 05/12 0701 - 05/13 0700 In: 586 [P.O.:580; I.V.:6] Out: 1901 [Urine:1900; Stool:1] Intake/Output from this shift:       . aspirin  162 mg Oral Daily  . B-complex with vitamin C  1 tablet Oral Daily  . cholecalciferol  2,000 Units Oral Daily  . cloNIDine  0.1 mg Oral BID  . diltiazem  240 mg Oral Daily  . ferrous sulfate  325 mg Oral TID WC  . furosemide  20 mg Oral BID  . gabapentin  600 mg Oral QHS  . insulin aspart  0-15 Units Subcutaneous TID WC  . insulin glargine  25 Units Subcutaneous QHS  . levofloxacin  250 mg Oral Q24H  . metoprolol tartrate  50 mg Oral QPC breakfast  . metoprolol tartrate  25 mg Oral QPC supper  . omega-3 acid ethyl esters  1 g Oral Daily  . pantoprazole  40 mg Oral Q1200  . simvastatin  10 mg Oral q1800  . sodium chloride  3 mL Intravenous Q12H  . vitamin C  500 mg Oral Daily  . DISCONTD: diltiazem  30 mg Oral Q8H  . DISCONTD: levofloxacin (LEVAQUIN) IV  250 mg Intravenous Q24H      Physical Exam: Elderly.  no distress. HEENT normal Neck supple Lungs: minimal basilar crackles Heart: Tachy. IRR 2/6 AS murmur. Abdomen is soft and nontender.   Venous stasis dermatitis Neurologic exam is normal strength and no localizingweakness.  No sensory deficits.  Lab Results:  Basename 12/31/11 0612 12/30/11 0508  WBC 5.8 4.9  HGB 8.5* 8.2*  PLT 265 238    Basename 12/31/11 0612 12/30/11 0508  NA 139 140  K 4.0 3.7  CL 100 99  CO2 30 31  GLUCOSE 151* 175*  BUN 28* 33*  CREATININE 0.89 0.84    Assessment:  1. AF with  RVR - not coumadin candidate due to falls.  2. Pleural effusion 3. Moderate AS 4. A/c diastolic HF 5. Acute respiratory failure - improved 6. UTI 7. CAD 8. Iron def anemia 9. DM2  Plan:  HR improved; euvolemic on exam. DC today on present meds. Complete 7 days of levaquin. FU on urine cx as outpatient. Check BMET 1 week. FU Dr Eden Emms 2-4 weeks >30 min PA and physician time D2  Olga Millers 12/31/2011, 7:45 AM

## 2011-12-31 NOTE — Progress Notes (Signed)
Clinical social worker assisted with patient discharge to skilled nursing facility [facility].  CSW addressed all family questions and concerns. CSW copied chart and added all important documents. Patient is being transported to the facility by patient's son, Alinda Money. Clinical Social Worker will sign off for now as social work intervention is no longer needed.  Sabino Niemann, MSW, [employer] [phone]

## 2011-12-31 NOTE — Progress Notes (Signed)
Inpatient Diabetes Program Recommendations  AACE/ADA: New Consensus Statement on Inpatient Glycemic Control (2009)  Target Ranges:  Prepandial:   less than 140 mg/dL      Peak postprandial:   less than 180 mg/dL (1-2 hours)      Critically ill patients:  140 - 180 mg/dL   Results for Emma Yu, Emma Yu (MRN [id number]) as of 12/31/2011 12:34  Ref. Range 12/30/2011 10:59 12/30/2011 16:56 12/30/2011 21:01 12/31/2011 06:23 12/31/2011 11:21  Glucose-Capillary Latest Range: 70-99 mg/dL 621 (H) 308 (H) 657 (H) 153 (H) 317 (H)    Inpatient Diabetes Program Recommendations Insulin - Meal Coverage: Start Novolog 10 units TID with meals   Note: Elevated post-prandials. Needs meal coverage Novolog.   Will continue to follow.  Thank you  Piedad Climes [facility] Inpatient Diabetes Coordinator [phone]

## 2012-01-01 NOTE — Telephone Encounter (Signed)
LM on pt's son's voicemail to inform him that Dr. Modesto Charon does not visit hospital patients and requested that he call the office to schedule a fu appt for pt.

## 2012-01-02 ENCOUNTER — Ambulatory Visit: Payer: Medicare Other | Admitting: Internal Medicine

## 2012-01-07 ENCOUNTER — Ambulatory Visit (INDEPENDENT_AMBULATORY_CARE_PROVIDER_SITE_OTHER): Payer: Medicare Other | Admitting: Internal Medicine

## 2012-01-07 ENCOUNTER — Other Ambulatory Visit (INDEPENDENT_AMBULATORY_CARE_PROVIDER_SITE_OTHER): Payer: Medicare Other

## 2012-01-07 ENCOUNTER — Encounter: Payer: Self-pay | Admitting: Internal Medicine

## 2012-01-07 VITALS — BP 136/62 | HR 66 | Temp 97.6°F | Resp 16 | Wt 144.2 lb

## 2012-01-07 DIAGNOSIS — I10 Essential (primary) hypertension: Secondary | ICD-10-CM

## 2012-01-07 DIAGNOSIS — D649 Anemia, unspecified: Secondary | ICD-10-CM | POA: Insufficient documentation

## 2012-01-07 DIAGNOSIS — I798 Other disorders of arteries, arterioles and capillaries in diseases classified elsewhere: Secondary | ICD-10-CM

## 2012-01-07 DIAGNOSIS — D510 Vitamin B12 deficiency anemia due to intrinsic factor deficiency: Secondary | ICD-10-CM

## 2012-01-07 DIAGNOSIS — E1051 Type 1 diabetes mellitus with diabetic peripheral angiopathy without gangrene: Secondary | ICD-10-CM

## 2012-01-07 DIAGNOSIS — I739 Peripheral vascular disease, unspecified: Secondary | ICD-10-CM

## 2012-01-07 DIAGNOSIS — E1059 Type 1 diabetes mellitus with other circulatory complications: Secondary | ICD-10-CM

## 2012-01-07 LAB — IBC PANEL
Iron: 46 ug/dL (ref 42–145)
Saturation Ratios: 12.4 % — ABNORMAL LOW (ref 20.0–50.0)
Transferrin: 264.3 mg/dL (ref 212.0–360.0)

## 2012-01-07 LAB — CBC WITH DIFFERENTIAL/PLATELET
Basophils Absolute: 0 K/uL (ref 0.0–0.1)
Basophils Relative: 0.5 % (ref 0.0–3.0)
Eosinophils Absolute: 0.2 K/uL (ref 0.0–0.7)
Eosinophils Relative: 1.8 % (ref 0.0–5.0)
HCT: 33.9 % — ABNORMAL LOW (ref 36.0–46.0)
Hemoglobin: 10.6 g/dL — ABNORMAL LOW (ref 12.0–15.0)
Lymphocytes Relative: 25.6 % (ref 12.0–46.0)
Lymphs Abs: 2.5 K/uL (ref 0.7–4.0)
MCHC: 31.3 g/dL (ref 30.0–36.0)
MCV: 88.2 fl (ref 78.0–100.0)
Monocytes Absolute: 0.8 K/uL (ref 0.1–1.0)
Monocytes Relative: 8.3 % (ref 3.0–12.0)
Neutro Abs: 6.2 K/uL (ref 1.4–7.7)
Neutrophils Relative %: 63.8 % (ref 43.0–77.0)
Platelets: 484 K/uL — ABNORMAL HIGH (ref 150.0–400.0)
RBC: 3.85 Mil/uL — ABNORMAL LOW (ref 3.87–5.11)
RDW: 19.8 % — ABNORMAL HIGH (ref 11.5–14.6)
WBC: 9.7 K/uL (ref 4.5–10.5)

## 2012-01-07 NOTE — Patient Instructions (Signed)

## 2012-01-07 NOTE — Assessment & Plan Note (Addendum)
Her BP is well controlled 

## 2012-01-07 NOTE — Progress Notes (Signed)
Subjective:    Patient ID: Emma Yu, female    DOB: 1927-04-26, 78 y.o.   MRN: [id number]  Anemia Presents for follow-up visit. Symptoms include light-headedness and malaise/fatigue. There has been no abdominal pain, anorexia, bruising/bleeding easily, confusion, fever, leg swelling, pallor, palpitations, paresthesias, pica or weight loss. Signs of blood loss that are not present include hematemesis, hematochezia, melena and vaginal bleeding. There are no compliance problems.  Side effects of medications include fatigue.  She has had a complicated course since I last saw her, she fell during the night and hit her heard and was recovering from that when she developed dehydration and was given more fluids and developed fluid overload and CHF. She has been admitted and readmitted several times over the last month. [name] currently in a nursing home and is recovering nicely with PT and nutrition support.    Review of Systems  Constitutional: Positive for malaise/fatigue. Negative for fever, chills, weight loss, diaphoresis, activity change, appetite change, fatigue and unexpected weight change.  HENT: Negative.   Eyes: Negative.   Respiratory: Negative for apnea, cough, chest tightness, shortness of breath, wheezing and stridor.   Cardiovascular: Negative for chest pain, palpitations and leg swelling.  Gastrointestinal: Negative for nausea, vomiting, abdominal pain, diarrhea, constipation, blood in stool, melena, hematochezia, abdominal distention, anorexia and hematemesis.  Genitourinary: Negative.  Negative for vaginal bleeding.  Musculoskeletal: Negative for myalgias, back pain, joint swelling, arthralgias and gait problem.  Skin: Negative for color change, pallor, rash and wound.  Neurological: Positive for light-headedness. Negative for dizziness, tremors, seizures, syncope, facial asymmetry, speech difficulty, weakness, numbness, headaches and paresthesias.  Hematological: Negative  for adenopathy. Does not bruise/bleed easily.  Psychiatric/Behavioral: Negative.  Negative for confusion.       Objective:   Physical Exam  Vitals reviewed. Constitutional: [name] oriented to person, place, and time. She appears well-developed and well-nourished. No distress.  HENT:  Head: Normocephalic and atraumatic. Not macrocephalic and not microcephalic. Head is without raccoon's eyes, without Battle's sign, without abrasion, without contusion, without right periorbital erythema and without left periorbital erythema.    Mouth/Throat: Oropharynx is clear and moist. No oropharyngeal exudate.  Eyes: Conjunctivae are normal. Right eye exhibits no discharge. Left eye exhibits no discharge. No scleral icterus.  Neck: Normal range of motion. Neck supple. No JVD present. No tracheal deviation present. No thyromegaly present.  Cardiovascular: Normal rate, regular rhythm and intact distal pulses.  Exam reveals no gallop and no friction rub.   Murmur heard. Pulmonary/Chest: Effort normal and breath sounds normal. No stridor. No respiratory distress. She has no wheezes. She has no rales. She exhibits no tenderness.  Abdominal: Soft. Bowel sounds are normal. She exhibits no distension and no mass. There is no tenderness. There is no rebound and no guarding.  Musculoskeletal: Normal range of motion. She exhibits no edema and no tenderness.  Lymphadenopathy:    She has no cervical adenopathy.  Neurological: [name] oriented to person, place, and time.  Skin: Skin is warm and dry. No rash noted. [name] not diaphoretic. No erythema. No pallor.  Psychiatric: She has a normal mood and affect. Her behavior is normal. Judgment and thought content normal.      Lab Results  Component Value Date   WBC 5.8 12/31/2011   HGB 8.5* 12/31/2011   HCT 28.0* 12/31/2011   PLT 265 12/31/2011   GLUCOSE 151* 12/31/2011   CHOL 109 09/10/2011   TRIG 121.0 09/10/2011   HDL 49.90 09/10/2011  LDLCALC 35 09/10/2011   ALT  9 12/10/2011   AST 14 12/10/2011   NA 139 12/31/2011   K 4.0 12/31/2011   CL 100 12/31/2011   CREATININE 0.89 12/31/2011   BUN 28* 12/31/2011   CO2 30 12/31/2011   TSH 0.511 12/10/2011   INR 1.18 12/25/2011   HGBA1C 6.0 09/10/2011      Assessment & Plan:

## 2012-01-07 NOTE — Assessment & Plan Note (Signed)
CBC/B12/folate levels today 

## 2012-01-07 NOTE — Assessment & Plan Note (Addendum)
Her last a1c was great

## 2012-01-07 NOTE — Assessment & Plan Note (Signed)
CBC and iron levels today 

## 2012-01-08 LAB — FOLATE: Folate: >24.8 ng/mL (ref >5.9)

## 2012-01-08 LAB — VITAMIN B12: Vitamin B-12: 802 pg/mL (ref 211–911)

## 2012-01-10 ENCOUNTER — Ambulatory Visit (INDEPENDENT_AMBULATORY_CARE_PROVIDER_SITE_OTHER)
Admission: RE | Admit: 2012-01-10 | Discharge: 2012-01-10 | Disposition: A | Discharge status: Home / Self Care | Payer: Medicare Other | Source: Ambulatory Visit | Attending: Physician Assistant | Admitting: Physician Assistant

## 2012-01-10 ENCOUNTER — Ambulatory Visit (INDEPENDENT_AMBULATORY_CARE_PROVIDER_SITE_OTHER): Payer: Medicare Other | Admitting: Physician Assistant

## 2012-01-10 ENCOUNTER — Encounter: Payer: Self-pay | Admitting: Physician Assistant

## 2012-01-10 VITALS — BP 116/58 | HR 77 | Ht 64.0 in | Wt 146.0 lb

## 2012-01-10 DIAGNOSIS — I4891 Unspecified atrial fibrillation: Secondary | ICD-10-CM

## 2012-01-10 DIAGNOSIS — I509 Heart failure, unspecified: Secondary | ICD-10-CM

## 2012-01-10 DIAGNOSIS — I359 Nonrheumatic aortic valve disorder, unspecified: Secondary | ICD-10-CM

## 2012-01-10 DIAGNOSIS — I10 Essential (primary) hypertension: Secondary | ICD-10-CM

## 2012-01-10 DIAGNOSIS — I5032 Chronic diastolic (congestive) heart failure: Secondary | ICD-10-CM

## 2012-01-10 DIAGNOSIS — I350 Nonrheumatic aortic (valve) stenosis: Secondary | ICD-10-CM

## 2012-01-10 DIAGNOSIS — I6529 Occlusion and stenosis of unspecified carotid artery: Secondary | ICD-10-CM

## 2012-01-10 MED ORDER — FUROSEMIDE 40 MG PO TABS: ORAL_TABLET | ORAL | Status: DC

## 2012-01-10 NOTE — Patient Instructions (Addendum)
Your physician recommends that you schedule a follow-up appointment in: 1 month with Tereso Newcomer, PA-C Your physician recommends that you schedule a follow-up appointment in: 3 months with Dr Eden Emms Your physician recommends that you return for lab work in: 1 week (BMP)  A chest x-ray takes a picture of the organs and structures inside the chest, including the heart, lungs, and blood vessels. This test can show several things, including, whether the heart is enlarges; whether fluid is building up in the lungs; and whether pacemaker / defibrillator leads are still in place.  Your physician has recommended you make the following change in your medication: INCREASE Furosemide to 40 mg in the AM and 20 mg in the PM

## 2012-01-10 NOTE — Progress Notes (Signed)
[address] [place], [place]  [address] Phone: [phone] Fax:  [phone]  Date:  01/10/2012   Name:  Emma Yu   DOB:  [birth year]   MRN:  [id number]  PCP:  Sanda Linger, MD, MD  Primary Cardiologist:  Dr. Charlton Haws  Primary Electrophysiologist:  None    History of Present Illness: Emma Yu is a 78 y.o. female who returns for post hospital follow up.  Previously followed by Dr. Lorine Bears and now sees Dr. Charlton Haws.  She has a history of mod aortic stenosis, Atrial fibrillation, diastolic heart failure, peripheral arterial disease, status post bilateral common iliac artery stenting in 2002 with known significant right SFA disease, diabetes, hypertension, hyperlipidemia.  LHC 01/10/11: Luminal irregularities.    She was admitted 4/20-4/30.  Prior to that, she had been seen at [facility] after suffering a fall resulting in laceration to her head.  When she presented to [facility] she was noted to be anemic and dehydrated and in acute renal failure in the setting of diarrhea.  She developed atrial fibrillation with rapid ventricular rate.  She was rate controlled.  She was not placed on anticoagulation secondary to significant fall risk.  She also developed volume overload due to rehydration.  This was treated with IV diuresis.    Carotid Dopplers 4/13: R ICA 60-79%; LICA 40-59% Echocardiogram 12/10/11: Moderate LVH, EF 65-70%, moderate aortic stenosis, mean gradient 20, mild MS.  She was re-admitted 5/7-5/9 with a/c diastolic CHF.  Chest x-ray confirmed CHF with associated bilateral effusions.  She was diuresed.  She was again felt to be a poor candidate for Coumadin secondary to fall risk.  She was seen by internal medicine due to reported history of dark stools and anemia.  She was Hemoccult negative.  She was iron deficient.  She has a history of right hemicolectomy in 2002.  She was placed on iron.  She was placed on Levaquin  for her urinary tract infection.  She was hypoxic and pulmonology evaluated her.  This was not felt to be related to pneumonia but rather CHF and effusions, plus/minus COPD.  Follow up chest x-ray was recommended.  She was discharged to [facility].    She states that she is being discharged to home this weekend.  She does not really note any significant change in her dyspnea.  She denies orthopnea or PND.  She does note lower extremity edema.  She feels that this is unchanged.  She denies chest discomfort.  She denies syncope.  She does get dizzy sometimes when she goes from lying to sitting in her bed in the early morning but this resolves quickly.  Wt Readings from Last 3 Encounters:  01/10/12 146 lb (66.225 kg)  01/07/12 144 lb 4 oz (65.431 kg)  12/31/11 137 lb 12.6 oz (62.5 kg)     Potassium  Date/Time Value Range Status  12/31/2011  6:12 AM 4.0  3.5-5.1 (mEq/L) Final     Creatinine, Ser  Date/Time Value Range Status  12/31/2011  6:12 AM 0.89  0.50-1.10 (mg/dL) Final     ALT  Date/Time Value Range Status  12/10/2011  4:05 AM 9  0-35 (U/L) Final     TSH  Date/Time Value Range Status  12/10/2011  4:05 AM 0.511  0.350-4.500 (uIU/mL) Final     Hemoglobin  Date/Time Value Range Status  01/07/2012  2:35 PM 10.6* 12.0-15.0 (g/dL) Final    Past Medical History  Diagnosis Date  .  PAD (peripheral artery disease)     s/p bilateral comon iliac artery stenting in 2002. Known significant  R SFA  disease. carotid dz noted 11/2011 followed by Dr. Imogene Burn  . Diabetes mellitus   . Hypertension   . Hyperlipidemia   . Venous insufficiency   . Chronic diastolic heart failure     Echocardiogram 12/10/11: Moderate LVH, EF 65-70%, moderate aortic stenosis, mean gradient 20, mild MS  . Atrial fibrillation     not a coumadin candidate secondary to fall risk  . DJD (degenerative joint disease) of hip     s/p R THR 10/2010  . Aortic stenosis     Moderate by echo 4/13 - mean 20 mmHg  . Mitral stenosis      Mild by echo 4/13  . Anemia     iron deficient  . Acute respiratory failure     12/2011 admission - decreased O2 sats on ambulation, improved by time of discharge  . CAD (coronary artery disease)     minimal plaque by cath 5/12    Current Outpatient Prescriptions  Medication Sig Dispense Refill  . acetaminophen (TYLENOL) 325 MG tablet Take 325 mg by mouth daily as needed. for pain.      . Ascorbic Acid (VITAMIN C) 500 MG tablet Take 500 mg by mouth daily.        Marland Kitchen aspirin 81 MG chewable tablet Chew 2 tablets (162 mg total) by mouth daily.  100 tablet  0  . b complex vitamins tablet Take 1 tablet by mouth daily.        . Cholecalciferol (VITAMIN D3) 2000 UNITS capsule Take 2,000 Units by mouth daily.        . cloNIDine (CATAPRES) 0.1 MG tablet Take 1 tablet (0.1 mg total) by mouth 2 (two) times daily.  60 tablet  0  . Coenzyme Q10 (CO Q 10) 100 MG CAPS Take 300 mg by mouth daily.      . diclofenac sodium (VOLTAREN) 1 % GEL Apply 1 application topically 4 (four) times daily as needed. For pain      . diltiazem (CARDIZEM CD) 240 MG 24 hr capsule Take 1 capsule (240 mg total) by mouth daily.  30 capsule  6  . ferrous sulfate 325 (65 FE) MG tablet Take 1 tablet (325 mg total) by mouth 3 (three) times daily with meals.  90 tablet  1  . fish oil-omega-3 fatty acids 1000 MG capsule Take 2 g by mouth daily.        . furosemide (LASIX) 20 MG tablet Take 1 tablet (20 mg total) by mouth 2 (two) times daily.  60 tablet  6  . gabapentin (NEURONTIN) 300 MG capsule Take 600 mg by mouth at bedtime.      . insulin aspart (NOVOLOG) 100 UNIT/ML injection Inject 7 Units into the skin 3 (three) times daily after meals.  1 vial  0  . insulin glargine (LANTUS) 100 UNIT/ML injection Inject 25 Units into the skin at bedtime.  10 mL  0  . methocarbamol (ROBAXIN) 500 MG tablet Take 500 mg by mouth 3 (three) times daily as needed. For muscle spasms       . metoprolol tartrate (LOPRESSOR) 25 MG tablet Take 2  tablets (50mg ) by mouth in the morning and 1 tablet (25mg ) in the evening.      Marland Kitchen omeprazole (PRILOSEC) 40 MG capsule Take 40 mg by mouth daily.       . pravastatin (PRAVACHOL) 20 MG  tablet Take 20 mg by mouth every evening.      . traMADol (ULTRAM) 50 MG tablet Take 1 tablet (50 mg total) by mouth every 6 (six) hours as needed. For pain  120 tablet  0    Allergies: Allergies  Allergen Reactions  . Carvedilol Other (See Comments)    Heart stops  . Diltiazem Hcl Other (See Comments)    Chest pain  . Fenofibrate Other (See Comments)    Unknown - [facility] - MAR  . Hctz (Hydrochlorothiazide) Other (See Comments)    Chest pain  . Nisoldipine Other (See Comments)    Chest pain  . Nitroglycerin Other (See Comments)    Unknown - NH MAR  . Propranolol Diarrhea    Chest pain  . Tekturna (Aliskiren Fumarate) Other (See Comments)    Unknown reaction  . Valturna (Aliskiren-Valsartan) Other (See Comments)    Unknown reaction    History  Substance Use Topics  . Smoking status: Former Smoker -- 2.0 packs/day for 25 years    Types: Cigarettes    Quit date: 08/20/1985  . Smokeless tobacco: Never Used   Comment: Retired-widowed 89, lives with son  . Alcohol Use: No     ROS:  Please see the history of present illness.   All other systems reviewed and negative.   PHYSICAL EXAM: VS:  BP 116/58  Pulse 77  Ht 5\' 4"  (1.626 m)  Wt 146 lb (66.225 kg)  BMI 25.06 kg/m2 Well nourished, well developed, in no acute distress HEENT: normal Neck: + JVD Cardiac:  normal S1, S2; Irregularly irregular rhythm; 2/6 systolic murmur RUSB Lungs:  Minimal crackles in the bases bilaterally, no wheezing, rhonchi  Abd: soft, nontender, no hepatomegaly Ext: tight 1+ bilateral LE edema Skin: warm and dry Neuro:  CNs 2-12 intact, no focal abnormalities noted  EKG:  Atrial fibrillation, heart rate 77, left axis deviation, nonspecific ST-T wave changes   ASSESSMENT AND PLAN:  1.  Chronic Diastolic Congestive  Heart Failure Her weight is up some over the last couple of weeks. She appears to have some extra volume. Breathing is stable. I think she would benefit from higher dose of Lasix.  Increase Lasix to 40 mg in the morning and 20 mg in the evening. Check a basic metabolic panel in one week. Follow up with me in 4 weeks and Dr. Eden Emms in 3 months.  2.  Atrial Fibrillation High risk for coumadin. Rate is controlled.  3.  Dyspnea and Hypoxia Recommendation in the hospital was to have a follow up CXR. I do not see any record of a repeat CXR in the system. Obtain followup chest x-ray  4.  Aortic Stenosis Moderate by recent echo.  5.  Hypertension Controlled.  Continue current therapy.   7.  Anemia Management per PCP. Recent Hgb improved  8.  Carotid Stenosis [name] due for follow up in 11/2012.   Luna Glasgow, PA-C  9:58 AM 01/10/2012

## 2012-01-11 ENCOUNTER — Telehealth: Payer: Self-pay | Admitting: *Deleted

## 2012-01-11 NOTE — Telephone Encounter (Signed)
Message copied by Tarri Fuller on Fri Jan 11, 2012 11:23 AM ------      Message from: [place], [place] T      Created: Thu Jan 10, 2012  4:53 PM       chest X-ray ok      Tereso Newcomer, [place]  4:53 PM 01/10/2012

## 2012-01-11 NOTE — Telephone Encounter (Signed)
lmom cxr ok

## 2012-01-17 ENCOUNTER — Other Ambulatory Visit (INDEPENDENT_AMBULATORY_CARE_PROVIDER_SITE_OTHER): Payer: Medicare Other

## 2012-01-17 ENCOUNTER — Encounter: Payer: Self-pay | Admitting: Endocrinology

## 2012-01-17 ENCOUNTER — Ambulatory Visit (INDEPENDENT_AMBULATORY_CARE_PROVIDER_SITE_OTHER): Payer: Medicare Other | Admitting: Endocrinology

## 2012-01-17 ENCOUNTER — Other Ambulatory Visit: Payer: Medicare Other

## 2012-01-17 ENCOUNTER — Telehealth: Payer: Self-pay

## 2012-01-17 VITALS — BP 136/62 | HR 69 | Temp 98.2°F | Ht 64.0 in | Wt 145.0 lb

## 2012-01-17 DIAGNOSIS — I739 Peripheral vascular disease, unspecified: Secondary | ICD-10-CM

## 2012-01-17 DIAGNOSIS — I4891 Unspecified atrial fibrillation: Secondary | ICD-10-CM

## 2012-01-17 DIAGNOSIS — I5032 Chronic diastolic (congestive) heart failure: Secondary | ICD-10-CM

## 2012-01-17 DIAGNOSIS — E1051 Type 1 diabetes mellitus with diabetic peripheral angiopathy without gangrene: Secondary | ICD-10-CM

## 2012-01-17 DIAGNOSIS — E1059 Type 1 diabetes mellitus with other circulatory complications: Secondary | ICD-10-CM

## 2012-01-17 DIAGNOSIS — I798 Other disorders of arteries, arterioles and capillaries in diseases classified elsewhere: Secondary | ICD-10-CM

## 2012-01-17 DIAGNOSIS — IMO0001 Reserved for inherently not codable concepts without codable children: Secondary | ICD-10-CM

## 2012-01-17 MED ORDER — POTASSIUM CHLORIDE CRYS ER 15 MEQ PO TBCR: 15.0000 meq | EXTENDED_RELEASE_TABLET | Freq: Every day | ORAL | Status: DC

## 2012-01-17 MED ORDER — TRAMADOL HCL 50 MG PO TABS: 50.0000 mg | ORAL_TABLET | Freq: Four times a day (QID) | ORAL | Status: DC | PRN

## 2012-01-17 NOTE — Telephone Encounter (Signed)
Nurse notified per MD 

## 2012-01-17 NOTE — Telephone Encounter (Signed)
1) yes, ok for this 2) start Klor - Rx was sent, recheck K+ level in one month

## 2012-01-17 NOTE — Patient Instructions (Addendum)
please resume your pump therapy: take a basal rate to 0.9 unit/hr, 24 hrs per day.   Take a bolus of 12 units with each meal.   continue correction bolus (which some people call "sensitivity," or "insulin sensitivity ratio," or just "isr") of 1 unit for each by 25 which your glucose exceeds 120. check your blood sugar 4 times a day--before the 3 meals, and at bedtime.  also check if you have symptoms of your blood sugar being too high or too low.  please keep a record of the readings and bring it to your next appointment here.  please call us sooner if you are having low blood sugar episodes. Please make a follow-up appointment in 3 months.

## 2012-01-17 NOTE — Telephone Encounter (Signed)
Chris from [facility] called stating that pt has been d/c for [facility] and is now home. 1) Pt has stage 2 pressure ulcer and she would like verbal orders to treat. 2) Patient is also taking 60mg  of lasix with no potassium regemen (epic shows potassium d/c 12/09/11). Please advise if pt should be taking a potassium and if so, please give instructions Thanks

## 2012-01-17 NOTE — Progress Notes (Signed)
Subjective:    Patient ID: Emma Yu, female    DOB: Dec 02, 1926, 78 y.o.   MRN: [id number]  HPI Pt was recently in the hospital, then nursing facility, for a fall, and resultant head injury.  She also had several other health probs while she was there.  They stopped her pump, and gave her lantus+novolog.  she brings a record of her cbg's which i have reviewed today.  It varies from 131-300.  There is no trend throughout the day. Past Medical History  Diagnosis Date  . PAD (peripheral artery disease)     s/p bilateral comon iliac artery stenting in 2002. Known significant  R SFA  disease. carotid dz noted 11/2011 followed by Dr. Imogene Burn  . Diabetes mellitus   . Hypertension   . Hyperlipidemia   . Venous insufficiency   . Chronic diastolic heart failure     Echocardiogram 12/10/11: Moderate LVH, EF 65-70%, moderate aortic stenosis, mean gradient 20, mild MS  . Atrial fibrillation     not a coumadin candidate secondary to fall risk  . DJD (degenerative joint disease) of hip     s/p R THR 10/2010  . Aortic stenosis     Moderate by echo 4/13 - mean 20 mmHg  . Mitral stenosis     Mild by echo 4/13  . Anemia     iron deficient  . Acute respiratory failure     12/2011 admission - decreased O2 sats on ambulation, improved by time of discharge  . CAD (coronary artery disease)     minimal plaque by cath 5/12    Past Surgical History  Procedure Date  . Total hip arthroplasty 1991, 1994    redo in 1994  . Lumbar disc surgery 1999  . Cardiac catheterization 01/10/2011    No significant CAD    History   Social History  . Marital Status: Widowed    Spouse Name: N/A    Number of Children: N/A  . Years of Education: N/A   Occupational History  .      retired   Social History Main Topics  . Smoking status: Former Smoker -- 2.0 packs/day for 25 years    Types: Cigarettes    Quit date: 08/20/1985  . Smokeless tobacco: Never Used   Comment: Retired-widowed 89, lives with son    . Alcohol Use: No  . Drug Use: No  . Sexually Active: Not Currently   Other Topics Concern  . Not on file   Social History Narrative   Lives with sonWidowed 1991Quit smoking in 1987    Current Outpatient Prescriptions on File Prior to Visit  Medication Sig Dispense Refill  . acetaminophen (TYLENOL) 325 MG tablet Take 325 mg by mouth daily as needed. for pain.      . Ascorbic Acid (VITAMIN C) 500 MG tablet Take 500 mg by mouth daily.        Marland Kitchen aspirin 81 MG chewable tablet Chew 2 tablets (162 mg total) by mouth daily.  100 tablet  0  . b complex vitamins tablet Take 1 tablet by mouth daily.        . Cholecalciferol (VITAMIN D3) 2000 UNITS capsule Take 2,000 Units by mouth daily.        . cloNIDine (CATAPRES) 0.1 MG tablet Take 1 tablet (0.1 mg total) by mouth 2 (two) times daily.  60 tablet  0  . Coenzyme Q10 (CO Q 10) 100 MG CAPS Take 300 mg by mouth daily.      Marland Kitchen  diclofenac sodium (VOLTAREN) 1 % GEL Apply 1 application topically 4 (four) times daily as needed. For pain      . diltiazem (CARDIZEM CD) 240 MG 24 hr capsule Take 1 capsule (240 mg total) by mouth daily.  30 capsule  6  . ferrous sulfate 325 (65 FE) MG tablet Take 1 tablet (325 mg total) by mouth 3 (three) times daily with meals.  90 tablet  1  . fish oil-omega-3 fatty acids 1000 MG capsule Take 2 g by mouth daily.        . furosemide (LASIX) 40 MG tablet Take 40 mg in the AM and 20 mg in the PM  45 tablet  6  . gabapentin (NEURONTIN) 300 MG capsule Take 600 mg by mouth at bedtime.      . methocarbamol (ROBAXIN) 500 MG tablet Take 500 mg by mouth 3 (three) times daily as needed. For muscle spasms       . metoprolol tartrate (LOPRESSOR) 25 MG tablet Take 2 tablets (50mg ) by mouth in the morning and 1 tablet (25mg ) in the evening.      Marland Kitchen omeprazole (PRILOSEC) 40 MG capsule Take 40 mg by mouth daily.       . potassium chloride SA (KLOR-CON M15) 15 MEQ tablet Take 1 tablet (15 mEq total) by mouth daily.  90 tablet  1  .  pravastatin (PRAVACHOL) 20 MG tablet Take 20 mg by mouth every evening.      . traMADol (ULTRAM) 50 MG tablet Take 1 tablet (50 mg total) by mouth every 6 (six) hours as needed. For pain  120 tablet  0    Allergies  Allergen Reactions  . Carvedilol Other (See Comments)    Heart stops  . Diltiazem Hcl Other (See Comments)    Chest pain  . Fenofibrate Other (See Comments)    Unknown - [facility] - MAR  . Hctz (Hydrochlorothiazide) Other (See Comments)    Chest pain  . Nisoldipine Other (See Comments)    Chest pain  . Nitroglycerin Other (See Comments)    Unknown - NH MAR  . Propranolol Diarrhea    Chest pain  . Tekturna (Aliskiren Fumarate) Other (See Comments)    Unknown reaction  . Valturna (Aliskiren-Valsartan) Other (See Comments)    Unknown reaction    Family History  Problem Relation Age of Onset  . Diabetes Father   . Diabetes Other     5/8 sibs    BP 136/62  Pulse 69  Temp(Src) 98.2 F (36.8 C) (Oral)  Ht 5\' 4"  (1.626 m)  Wt 145 lb (65.772 kg)  BMI 24.89 kg/m2  SpO2 95%    Review of Systems denies hypoglycemia    Objective:   Physical Exam VITAL SIGNS:  See vs page GENERAL: no distress SKIN:  Insulin injection sites at the anterior abdomen are normal, except for a few ecchymoses.  Lab Results  Component Value Date   HGBA1C 5.8 01/17/2012      Assessment & Plan:  DM.  overcontrolled

## 2012-01-18 ENCOUNTER — Telehealth: Payer: Self-pay | Admitting: *Deleted

## 2012-01-18 ENCOUNTER — Telehealth: Payer: Self-pay | Admitting: Cardiovascular Disease

## 2012-01-18 LAB — BASIC METABOLIC PANEL
Chloride: 97 mEq/L (ref 96–112)
GFR: 43.14 mL/min — ABNORMAL LOW (ref >60.00)
Potassium: 4.2 mEq/L (ref 3.5–5.1)
Sodium: 140 mEq/L (ref 135–145)

## 2012-01-18 LAB — HEMOGLOBIN A1C: Hgb A1c MFr Bld: 5.8 % (ref 4.6–6.5)

## 2012-01-18 NOTE — Telephone Encounter (Signed)
Message copied by Tarri Fuller on Fri Jan 18, 2012  4:38 PM ------      Message from: [place], [place] T      Created: Fri Jan 18, 2012  1:35 PM       Decrease Lasix to 40 mg QD      Repeat BMET in 10 days      Tereso Newcomer, PA-C  1:35 PM 01/18/2012

## 2012-01-18 NOTE — Telephone Encounter (Signed)
Can take 40 bid and check BMET in 2 weeks

## 2012-01-18 NOTE — Telephone Encounter (Signed)
I was speaking with the pt when she had told me that someone from our office had already called earlier. I looked and found phone note from Scherrie Bateman, LPN from Dr. Eden Emms as well. Please see instructions from Dr. Eden Emms. Pt is aware to take lasix 40 mg bid and will have repeat bmet in 2 weeks per Dr. Fabio Bering instructions. Danelle Earthly will get the repeat bmet in 2 weeks.   Scherrie Bateman, LPN 0/98/1191 4:78 PM Signed  CHRIS AT [facility] AWARE WILL CALL PT WITH NEW INSTRUCTIONS./CY Charlton Haws, MD 01/18/2012 4:03 PM Signed  Can take 40 bid and check BMET in 2 weeks Scherrie Bateman, LPN 2/95/6213 08:65 PM Signed  SPOKE WITH CHRIS AT [facility] PT HAS EDEMA LEGS ARE SHINY ONLY HAD TRACE THE OTHER DAY PT IS ELEVATING AS MUCH AS POSSIBLE CURRENTLY TAKING FUROSEMIDE 40 MG QAM AND 20 MG Q PM HAS GAINED 2 LBS IN 24 HOURS ./CY Lela Vivia [place] 01/18/2012 11:29 AM Signed  Pt has gained 2lbs in the last day has edema in LE and pt is not on potassium pt Pharm [facility] [phone]

## 2012-01-18 NOTE — Telephone Encounter (Signed)
CHRIS AT [facility] AWARE WILL CALL PT WITH  NEW INSTRUCTIONS./CY

## 2012-01-18 NOTE — Telephone Encounter (Signed)
Pt has gained 2lbs in the last day has edema in LE and pt is not on potassium pt Pharm [facility] [phone]

## 2012-01-18 NOTE — Telephone Encounter (Signed)
SPOKE WITH CHRIS AT [facility]  PT  HAS  EDEMA LEGS ARE SHINY ONLY HAD TRACE  THE OTHER DAY  PT IS ELEVATING AS MUCH AS POSSIBLE  CURRENTLY TAKING FUROSEMIDE 40 MG QAM AND 20 MG Q PM  HAS GAINED 2 LBS  IN 24 HOURS ./CY

## 2012-01-19 ENCOUNTER — Encounter: Payer: Self-pay | Admitting: Endocrinology

## 2012-01-21 ENCOUNTER — Telehealth: Payer: Self-pay | Admitting: *Deleted

## 2012-01-21 LAB — URINE CULTURE
Colony Count: >=100000
Culture  Setup Time: 201305091929

## 2012-01-21 NOTE — Telephone Encounter (Signed)
s/w Eber Jones @ [facility] in the [place] office [phone] to get a repeat BMET this week per Bing Neighbors. PAC instead of 2 weeks due to creatinine

## 2012-01-21 NOTE — Telephone Encounter (Signed)
s/w Carolyn @ [facility] in the [place] office [phone] to get a repeat BMET this week per Scott W. PAC instead of 2 weeks due to creatinine 

## 2012-01-21 NOTE — Telephone Encounter (Signed)
Ok With increased dose of lasix and increased creatinine Would recheck BMET one week after taking Lasix 40 bid Tereso Newcomer, [place]  9:31 AM 01/21/2012

## 2012-01-21 NOTE — Telephone Encounter (Signed)
Called pt to inform of lab results, pt informed (letter also mailed to pt). 

## 2012-01-22 ENCOUNTER — Telehealth: Payer: Self-pay

## 2012-01-22 NOTE — Telephone Encounter (Signed)
[facility] called again to inform MD that pt has refused to go to the ER or UC and she also has not increased her Lasix to 40 mg BID.

## 2012-01-22 NOTE — Telephone Encounter (Signed)
Nurse from [facility] called stating that she is out at patient's home. She reports increased leg swelling since last Thursday, redness from the knees down, and "bumpiness which appears to be cellulitis". Spoke with PCP who advise that pt should go to ER

## 2012-01-24 ENCOUNTER — Encounter (HOSPITAL_COMMUNITY): Payer: Self-pay | Admitting: *Deleted

## 2012-01-24 ENCOUNTER — Emergency Department (HOSPITAL_COMMUNITY)
Admission: EM | Admit: 2012-01-24 | Discharge: 2012-01-24 | Disposition: A | Discharge status: Home / Self Care | Payer: Medicare Other | Attending: Emergency Medicine | Admitting: Emergency Medicine

## 2012-01-24 DIAGNOSIS — M169 Osteoarthritis of hip, unspecified: Secondary | ICD-10-CM | POA: Insufficient documentation

## 2012-01-24 DIAGNOSIS — E119 Type 2 diabetes mellitus without complications: Secondary | ICD-10-CM | POA: Insufficient documentation

## 2012-01-24 DIAGNOSIS — D649 Anemia, unspecified: Secondary | ICD-10-CM | POA: Insufficient documentation

## 2012-01-24 DIAGNOSIS — E785 Hyperlipidemia, unspecified: Secondary | ICD-10-CM | POA: Insufficient documentation

## 2012-01-24 DIAGNOSIS — Z87891 Personal history of nicotine dependence: Secondary | ICD-10-CM | POA: Insufficient documentation

## 2012-01-24 DIAGNOSIS — L03119 Cellulitis of unspecified part of limb: Secondary | ICD-10-CM | POA: Insufficient documentation

## 2012-01-24 DIAGNOSIS — Z7982 Long term (current) use of aspirin: Secondary | ICD-10-CM | POA: Insufficient documentation

## 2012-01-24 DIAGNOSIS — Z79899 Other long term (current) drug therapy: Secondary | ICD-10-CM | POA: Insufficient documentation

## 2012-01-24 DIAGNOSIS — I10 Essential (primary) hypertension: Secondary | ICD-10-CM | POA: Insufficient documentation

## 2012-01-24 DIAGNOSIS — L02419 Cutaneous abscess of limb, unspecified: Secondary | ICD-10-CM | POA: Insufficient documentation

## 2012-01-24 DIAGNOSIS — M1610 Unilateral primary osteoarthritis, unspecified hip: Secondary | ICD-10-CM | POA: Insufficient documentation

## 2012-01-24 DIAGNOSIS — Z96649 Presence of unspecified artificial hip joint: Secondary | ICD-10-CM | POA: Insufficient documentation

## 2012-01-24 DIAGNOSIS — I2510 Atherosclerotic heart disease of native coronary artery without angina pectoris: Secondary | ICD-10-CM | POA: Insufficient documentation

## 2012-01-24 DIAGNOSIS — I4891 Unspecified atrial fibrillation: Secondary | ICD-10-CM | POA: Insufficient documentation

## 2012-01-24 DIAGNOSIS — L03116 Cellulitis of left lower limb: Secondary | ICD-10-CM

## 2012-01-24 DIAGNOSIS — L03115 Cellulitis of right lower limb: Secondary | ICD-10-CM

## 2012-01-24 LAB — BASIC METABOLIC PANEL
CO2: 31 mEq/L (ref 19–32)
Calcium: 10.5 mg/dL (ref 8.4–10.5)
Chloride: 97 mEq/L (ref 96–112)
Creatinine, Ser: 1 mg/dL (ref 0.50–1.10)
Glucose, Bld: 101 mg/dL — ABNORMAL HIGH (ref 70–99)

## 2012-01-24 LAB — CBC
HCT: 41 % (ref 36.0–46.0)
Hemoglobin: 12.7 g/dL (ref 12.0–15.0)
MCV: 94 fL (ref 78.0–100.0)
RBC: 4.36 MIL/uL (ref 3.87–5.11)
WBC: 6.5 10*3/uL (ref 4.0–10.5)

## 2012-01-24 LAB — DIFFERENTIAL
Eosinophils Relative: 3 % (ref 0–5)
Lymphocytes Relative: 23 % (ref 12–46)
Lymphs Abs: 1.5 10*3/uL (ref 0.7–4.0)
Monocytes Absolute: 0.9 10*3/uL (ref 0.1–1.0)
Neutro Abs: 3.9 10*3/uL (ref 1.7–7.7)

## 2012-01-24 MED ORDER — ENOXAPARIN SODIUM 80 MG/0.8ML ~~LOC~~ SOLN
65.0000 mg | Freq: Once | SUBCUTANEOUS | Status: AC
  Administered 2012-01-24: 65 mg via SUBCUTANEOUS
  Filled 2012-01-24: qty 0.8

## 2012-01-24 MED ORDER — CLINDAMYCIN PHOSPHATE 900 MG/50ML IV SOLN
900.0000 mg | Freq: Once | INTRAVENOUS | Status: AC
  Administered 2012-01-24: 900 mg via INTRAVENOUS
  Filled 2012-01-24: qty 50

## 2012-01-24 MED ORDER — CLINDAMYCIN HCL 150 MG PO CAPS: 300.0000 mg | ORAL_CAPSULE | Freq: Three times a day (TID) | ORAL | Status: DC

## 2012-01-24 NOTE — ED Notes (Signed)
Pharmacy notified re: cleocin

## 2012-01-24 NOTE — ED Provider Notes (Addendum)
History     CSN: [id number]  Arrival date & time 01/24/12  1750   First MD Initiated Contact with Patient 01/24/12 1848      Chief Complaint  Patient presents with  . cellulitis both lower legs     (Consider location/radiation/quality/duration/timing/severity/associated sxs/prior treatment) HPI Comments: Patient presents with bilateral lower extremities swelling and redness.  Patient notes that over the last few days her lower legs got more swollen and there's been some erythema.  No fevers, nausea, vomiting.  No chest pain or shortness of breath.  Patient does have a known history of vascular insufficiency, diabetes and CHF.  Patient was seen at an urgent care today and sent in here for further evaluation and possible need for a vascular ultrasound to rule out DVT since the patient does have worse swelling on the left leg compared with the right.  Patient also notes that she had a history of a DVT in the left lower extremity 2 years ago and is not currently on anticoagulation beyond aspirin.  The history is provided by the patient. No language interpreter was used.    Past Medical History  Diagnosis Date  . PAD (peripheral artery disease)     s/p bilateral comon iliac artery stenting in 2002. Known significant  R SFA  disease. carotid dz noted 11/2011 followed by Dr. Imogene Burn  . Diabetes mellitus   . Hypertension   . Hyperlipidemia   . Venous insufficiency   . Chronic diastolic heart failure     Echocardiogram 12/10/11: Moderate LVH, EF 65-70%, moderate aortic stenosis, mean gradient 20, mild MS  . Atrial fibrillation     not a coumadin candidate secondary to fall risk  . DJD (degenerative joint disease) of hip     s/p R THR 10/2010  . Aortic stenosis     Moderate by echo 4/13 - mean 20 mmHg  . Mitral stenosis     Mild by echo 4/13  . Anemia     iron deficient  . Acute respiratory failure     12/2011 admission - decreased O2 sats on ambulation, improved by time of discharge  . CAD  (coronary artery disease)     minimal plaque by cath 5/12    Past Surgical History  Procedure Date  . Total hip arthroplasty 1991, 1994    redo in 1994  . Lumbar disc surgery 1999  . Cardiac catheterization 01/10/2011    No significant CAD    Family History  Problem Relation Age of Onset  . Diabetes Father   . Diabetes Other     5/8 sibs    History  Substance Use Topics  . Smoking status: Former Smoker -- 2.0 packs/day for 25 years    Types: Cigarettes    Quit date: 08/20/1985  . Smokeless tobacco: Never Used   Comment: Retired-widowed 89, lives with son  . Alcohol Use: No    OB History    Grav Para Term Preterm Abortions TAB SAB Ect Mult Living                  Review of Systems  Constitutional: Negative.  Negative for fever and chills.  HENT: Negative.   Eyes: Negative.  Negative for discharge and redness.  Respiratory: Negative.  Negative for cough and shortness of breath.   Cardiovascular: Positive for leg swelling. Negative for chest pain.  Gastrointestinal: Negative.  Negative for nausea, vomiting, abdominal pain and diarrhea.  Genitourinary: Negative.   Musculoskeletal: Negative.  Negative for  back pain.  Skin: Positive for color change. Negative for rash.  Neurological: Negative.  Negative for syncope and headaches.  Hematological: Negative.  Negative for adenopathy.  Psychiatric/Behavioral: Negative.  Negative for confusion.  All other systems reviewed and are negative.    Allergies  Carvedilol; Diltiazem hcl; Fenofibrate; Hctz; Nisoldipine; Nitroglycerin; Propranolol; Derl Barrow  Home Medications   Current Outpatient Rx  Name Route Sig Dispense Refill  . VITAMIN C 500 MG PO TABS Oral Take 500 mg by mouth daily.      . ASPIRIN 81 MG PO CHEW Oral Chew 2 tablets (162 mg total) by mouth daily. 100 tablet 0  . B COMPLEX PO TABS Oral Take 1 tablet by mouth daily.      Marland Kitchen VITAMIN D3 2000 UNITS PO CAPS Oral Take 2,000 Units by mouth daily.       Marland Kitchen CLONIDINE HCL 0.1 MG PO TABS Oral Take 1 tablet (0.1 mg total) by mouth 2 (two) times daily. 60 tablet 0  . CO Q 10 100 MG PO CAPS Oral Take 300 mg by mouth daily.    Marland Kitchen DICLOFENAC SODIUM 1 % TD GEL Topical Apply 1 application topically 4 (four) times daily as needed. For pain      Before continuing, please talk to your primary car ...  . DILTIAZEM HCL ER COATED BEADS 240 MG PO CP24 Oral Take 1 capsule (240 mg total) by mouth daily. 30 capsule 6  . FERROUS SULFATE 325 (65 FE) MG PO TABS Oral Take 1 tablet (325 mg total) by mouth 3 (three) times daily with meals. 90 tablet 1    Follow up with your primary doctor for your anemia ...  . OMEGA-3 FATTY ACIDS 1000 MG PO CAPS Oral Take 2 g by mouth daily.      . FUROSEMIDE 40 MG PO TABS  Take 40 mg in the AM and 20 mg in the PM 45 tablet 6  . GABAPENTIN 300 MG PO CAPS Oral Take 600 mg by mouth at bedtime.    . INSET INFUSION SET 23" MISC Does not apply 1 Device by Does not apply route every 3 (three) days.    . INSULIN PUMP SYRINGE RESERVOIR MISC Does not apply 1 Device by Does not apply route every 3 (three) days.    . INSULIN LISPRO (HUMAN) 100 UNIT/ML [place] SOLN  For use in pump, total of 70 units/day    . METHOCARBAMOL 500 MG PO TABS Oral Take 500 mg by mouth 3 (three) times daily as needed. For muscle spasms     . METOPROLOL TARTRATE 25 MG PO TABS  Take 2 tablets (50mg ) by mouth in the morning and 1 tablet (25mg ) in the evening.    Marland Kitchen OMEPRAZOLE 40 MG PO CPDR Oral Take 40 mg by mouth daily.     Marland Kitchen PRAVASTATIN SODIUM 20 MG PO TABS Oral Take 20 mg by mouth every evening.    Marland Kitchen TRAMADOL HCL 50 MG PO TABS Oral Take 1 tablet (50 mg total) by mouth every 6 (six) hours as needed. For pain 120 tablet 0  . ACETAMINOPHEN 325 MG PO TABS Oral Take 325 mg by mouth daily as needed. for pain.      BP 154/63  Pulse 74  Temp(Src) 97.4 F (36.3 C) (Oral)  Resp 18  SpO2 95%  Physical Exam  Nursing note and vitals reviewed. Constitutional: [name]  oriented to person, place, and time. She appears well-developed and well-nourished.  Non-toxic appearance.  She does not have a sickly appearance.  HENT:  Head: Normocephalic and atraumatic.  Eyes: Conjunctivae, EOM and lids are normal. Pupils are equal, round, and reactive to light. No scleral icterus.  Neck: Trachea normal and normal range of motion. Neck supple.  Cardiovascular: Normal rate, regular rhythm and normal heart sounds.   Pulmonary/Chest: Effort normal and breath sounds normal. No respiratory distress. She has no wheezes. She has no rales.  Abdominal: Soft. Normal appearance. There is no CVA tenderness.  Musculoskeletal: Normal range of motion. She exhibits edema.       Bilateral DP pulses are palpable.  Capillary refill less than 2 seconds in her feet.  Patient does have mild to moderate lower extremity swelling that extends to her proximal lower leg.  The left lower extremity does have more swelling than the right.  There is some mild erythema and warmth in both legs.  There is noted thinning of the skin as well.  Neurological: [name] alert and oriented to person, place, and time. She has normal strength.  Skin: Skin is warm, dry and intact. No rash noted.  Psychiatric: She has a normal mood and affect. Her behavior is normal. Judgment and thought content normal.    ED Course  Procedures (including critical care time)  Results for orders placed during the hospital encounter of 01/24/12  CBC      Component Value Range   WBC 6.5  4.0 - 10.5 (K/uL)   RBC 4.36  3.87 - 5.11 (MIL/uL)   Hemoglobin 12.7  12.0 - 15.0 (g/dL)   HCT 96.0  45.4 - 09.8 (%)   MCV 94.0  78.0 - 100.0 (fL)   MCH 29.1  26.0 - 34.0 (pg)   MCHC 31.0  30.0 - 36.0 (g/dL)   RDW 11.9 (*) 14.7 - 15.5 (%)   Platelets 186  150 - 400 (K/uL)  DIFFERENTIAL      Component Value Range   Neutrophils Relative 60  43 - 77 (%)   Neutro Abs 3.9  1.7 - 7.7 (K/uL)   Lymphocytes Relative 23  12 - 46 (%)   Lymphs Abs 1.5   0.7 - 4.0 (K/uL)   Monocytes Relative 14 (*) 3 - 12 (%)   Monocytes Absolute 0.9  0.1 - 1.0 (K/uL)   Eosinophils Relative 3  0 - 5 (%)   Eosinophils Absolute 0.2  0.0 - 0.7 (K/uL)   Basophils Relative 1  0 - 1 (%)   Basophils Absolute 0.0  0.0 - 0.1 (K/uL)  BASIC METABOLIC PANEL      Component Value Range   Sodium 142  135 - 145 (mEq/L)   Potassium 3.4 (*) 3.5 - 5.1 (mEq/L)   Chloride 97  96 - 112 (mEq/L)   CO2 31  19 - 32 (mEq/L)   Glucose, Bld 101 (*) 70 - 99 (mg/dL)   BUN 21  6 - 23 (mg/dL)   Creatinine, Ser 8.29  0.50 - 1.10 (mg/dL)   Calcium 56.2  8.4 - 10.5 (mg/dL)   GFR calc non Af Amer 51 (*) >90 (mL/min)   GFR calc Af Amer 59 (*) >90 (mL/min)      MDM  Patient with) cellulitis in both lower legs.  She shows no significant signs of systemic illness at this time to necessitate admission.  I will check a CBC and a BMP given the patient's diabetes.  The left leg is slightly bigger than the right and she has had a DVT before so  there is some consideration for this.  Unfortunately due to the time of day I'm unable to obtain a vascular ultrasound currently.  After receiving the patient's renal function if it is safe to do so I will give the patient a dose of Lovenox and put in an order for the patient to have the ultrasound completed tomorrow.   Nat Christen, MD 01/24/12 1911  Patient with no significant leukocytosis on her CBC.  She has normal renal function at this time.  I will give the patient a dose of Lovenox here and arrange for her DVT study tomorrow.  She'll be placed on clindamycin for antibiotics as well.  Nat Christen, MD 01/26/12 [phone]

## 2012-01-24 NOTE — ED Notes (Signed)
The pt was sent here from the urgent care in [place].  She has redness and swelling in both lower extremities this is an ongoing problem

## 2012-01-24 NOTE — Discharge Instructions (Signed)
You have a leg ultrasound ordered to look for a blood clot in your legs.  They should call you tomorrow to arrange a time for this study.  Please return for further worsening of the swelling, redness, fevers, pain, shortness of breath or chest pain.   Cellulitis Cellulitis is an infection of the skin and the tissue beneath it. The area is typically red and tender. It is caused by germs (bacteria) (usually staph or strep) that enter the body through cuts or sores. Cellulitis most commonly occurs in the arms or lower legs.  HOME CARE INSTRUCTIONS   If you are given a prescription for medications which kill germs (antibiotics), take as directed until finished.   If the infection is on the arm or leg, keep the limb elevated as able.   Use a warm cloth several times per day to relieve pain and encourage healing.   See your caregiver for recheck of the infected site as directed if problems arise.   Only take over-the-counter or prescription medicines for pain, discomfort, or fever as directed by your caregiver.  SEEK MEDICAL CARE IF:   The area of redness (inflammation) is spreading, there are red streaks coming from the infected site, or if a part of the infection begins to turn dark in color.   The joint or bone underneath the infected skin becomes painful after the skin has healed.   The infection returns in the same or another area after it seems to have gone away.   A boil or bump swells up. This may be an abscess.   New, unexplained problems such as pain or fever develop.  SEEK IMMEDIATE MEDICAL CARE IF:   You have a fever.   You or your child feels drowsy or lethargic.   There is vomiting, diarrhea, or lasting discomfort or feeling ill (malaise) with muscle aches and pains.  MAKE SURE YOU:   Understand these instructions.   Will watch your condition.   Will get help right away if you are not doing well or get worse.  Document Released: 05/16/2005 Document Revised: 07/26/2011  Document Reviewed: 03/24/2008 [facility] Patient Information 2012 [place], [place].

## 2012-01-24 NOTE — ED Notes (Signed)
Assisted pt with changing undergarment.

## 2012-01-24 NOTE — ED Notes (Signed)
Cellulitis to bilateral LE. Redness marked.

## 2012-01-25 ENCOUNTER — Ambulatory Visit (HOSPITAL_COMMUNITY)
Admission: RE | Admit: 2012-01-25 | Discharge: 2012-01-25 | Disposition: A | Discharge status: Home / Self Care | Payer: Medicare Other | Source: Ambulatory Visit | Attending: Emergency Medicine | Admitting: Emergency Medicine

## 2012-01-25 ENCOUNTER — Telehealth: Payer: Self-pay | Admitting: Internal Medicine

## 2012-01-25 DIAGNOSIS — I872 Venous insufficiency (chronic) (peripheral): Secondary | ICD-10-CM

## 2012-01-25 DIAGNOSIS — Z86718 Personal history of other venous thrombosis and embolism: Secondary | ICD-10-CM | POA: Insufficient documentation

## 2012-01-25 NOTE — Telephone Encounter (Signed)
Caller: Ramiro Harvest, RN/Other; PCP: Sanda Linger; CB#: [phone]; ; ; Call regarding Thayer Ohm, RN With [facility] Called To Report Ms. Easler Was Admitted To [facility] for A. Possible Blood Clot in Her Leg, and Recently Her Cardiologist Increased Her Lasix To 40 Mg BID.  Info to office for provider review/callback. MAY REACH CHRIS RN AT [phone].

## 2012-01-25 NOTE — Telephone Encounter (Signed)
I am not sure what to make of this, does she have a blood clot?

## 2012-01-25 NOTE — Telephone Encounter (Signed)
Emma Yu - OT with [facility] also called requesting  MD authorization to HOLD services until pt is medically cleared. Please advise.   Don call back - [phone]

## 2012-01-25 NOTE — Progress Notes (Signed)
VASCULAR LAB PRELIMINARY  PRELIMINARY  PRELIMINARY  PRELIMINARY  Bil LEV duplex completed.    Preliminary report: Negative for deep and superficial vein thrombosis bilaterally  Vanna Scotland,   RVT  01/25/2012, 5:42 PM

## 2012-01-26 ENCOUNTER — Encounter (HOSPITAL_COMMUNITY): Payer: Self-pay | Admitting: Emergency Medicine

## 2012-01-26 ENCOUNTER — Emergency Department (HOSPITAL_COMMUNITY)
Admission: EM | Admit: 2012-01-26 | Discharge: 2012-01-26 | Disposition: A | Discharge status: Home / Self Care | Payer: Medicare Other | Attending: Emergency Medicine | Admitting: Emergency Medicine

## 2012-01-26 DIAGNOSIS — R251 Tremor, unspecified: Secondary | ICD-10-CM

## 2012-01-26 DIAGNOSIS — R110 Nausea: Secondary | ICD-10-CM | POA: Insufficient documentation

## 2012-01-26 DIAGNOSIS — I359 Nonrheumatic aortic valve disorder, unspecified: Secondary | ICD-10-CM | POA: Insufficient documentation

## 2012-01-26 DIAGNOSIS — L02419 Cutaneous abscess of limb, unspecified: Secondary | ICD-10-CM | POA: Insufficient documentation

## 2012-01-26 DIAGNOSIS — I10 Essential (primary) hypertension: Secondary | ICD-10-CM | POA: Insufficient documentation

## 2012-01-26 DIAGNOSIS — I509 Heart failure, unspecified: Secondary | ICD-10-CM | POA: Insufficient documentation

## 2012-01-26 DIAGNOSIS — I5032 Chronic diastolic (congestive) heart failure: Secondary | ICD-10-CM | POA: Insufficient documentation

## 2012-01-26 DIAGNOSIS — R259 Unspecified abnormal involuntary movements: Secondary | ICD-10-CM | POA: Insufficient documentation

## 2012-01-26 DIAGNOSIS — E119 Type 2 diabetes mellitus without complications: Secondary | ICD-10-CM | POA: Insufficient documentation

## 2012-01-26 DIAGNOSIS — L0390 Cellulitis, unspecified: Secondary | ICD-10-CM

## 2012-01-26 DIAGNOSIS — I059 Rheumatic mitral valve disease, unspecified: Secondary | ICD-10-CM | POA: Insufficient documentation

## 2012-01-26 MED ORDER — ONDANSETRON 8 MG PO TBDP: 8.0000 mg | ORAL_TABLET | Freq: Three times a day (TID) | ORAL | Status: AC | PRN

## 2012-01-26 MED ORDER — ONDANSETRON 8 MG PO TBDP: 8.0000 mg | ORAL_TABLET | Freq: Three times a day (TID) | ORAL | Status: DC | PRN

## 2012-01-26 NOTE — ED Notes (Signed)
Pt. Given Clindamycin for leg infection and my tremors are worse and I'm nausea.

## 2012-01-26 NOTE — Discharge Instructions (Signed)
Elevate your legs above your heart as much as possible. Use heat on the sore areas 3 times a day. Decrease the clindamycin to one pill 3 times a day (150 mg, 3 times a day). See your Dr. as needed for problems.    Cellulitis Cellulitis is an infection of the skin and the tissue beneath it. The area is typically red and tender. It is caused by germs (bacteria) (usually staph or strep) that enter the body through cuts or sores. Cellulitis most commonly occurs in the arms or lower legs.  HOME CARE INSTRUCTIONS   If you are given a prescription for medications which kill germs (antibiotics), take as directed until finished.   If the infection is on the arm or leg, keep the limb elevated as able.   Use a warm cloth several times per day to relieve pain and encourage healing.   See your caregiver for recheck of the infected site as directed if problems arise.   Only take over-the-counter or prescription medicines for pain, discomfort, or fever as directed by your caregiver.  SEEK MEDICAL CARE IF:   The area of redness (inflammation) is spreading, there are red streaks coming from the infected site, or if a part of the infection begins to turn dark in color.   The joint or bone underneath the infected skin becomes painful after the skin has healed.   The infection returns in the same or another area after it seems to have gone away.   A boil or bump swells up. This may be an abscess.   New, unexplained problems such as pain or fever develop.  SEEK IMMEDIATE MEDICAL CARE IF:   You have a fever.   You or your child feels drowsy or lethargic.   There is vomiting, diarrhea, or lasting discomfort or feeling ill (malaise) with muscle aches and pains.  MAKE SURE YOU:   Understand these instructions.   Will watch your condition.   Will get help right away if you are not doing well or get worse.  Document Released: 05/16/2005 Document Revised: 07/26/2011 Document Reviewed:  03/24/2008 [employer] Patient Information 2012 [employer], Marnee Guarneri, Adult Nausea is the feeling that you have an upset stomach or have to vomit. Nausea by itself is not likely a serious concern, but it may be an early sign of more serious medical problems. As nausea gets worse, it can lead to vomiting. If vomiting develops, there is the risk of dehydration.  CAUSES   Viral infections.   Food poisoning.   Medicines.   Pregnancy.   Motion sickness.   Migraine headaches.   Emotional distress.   Severe pain from any source.   Alcohol intoxication.  HOME CARE INSTRUCTIONS  Get plenty of rest.   Ask your caregiver about specific rehydration instructions.   Eat small amounts of food and sip liquids more often.   Take all medicines as told by your caregiver.  SEEK MEDICAL CARE IF:  You have not improved after 2 days, or you get worse.   You have a headache.  SEEK IMMEDIATE MEDICAL CARE IF:   You have a fever.   You faint.   You keep vomiting or have blood in your vomit.   You are extremely weak or dehydrated.   You have dark or bloody stools.   You have severe chest or abdominal pain.  MAKE SURE YOU:  Understand these instructions.   Will watch your condition.   Will get help right away if you are not  doing well or get worse.  Document Released: 09/13/2004 Document Revised: 07/26/2011 Document Reviewed: 04/18/2011 [facility] Patient Information 2012 [place], [place].Marland Kitchen

## 2012-01-26 NOTE — ED Provider Notes (Signed)
History   This chart was scribed for Flint Melter, MD by Shari Heritage. The patient was seen in room STRE4/STRE4. Patient's care was started at 1206.     CSN: [id number]  Arrival date & time 01/26/12  1206   First MD Initiated Contact with Patient 01/26/12 1317      Chief Complaint  Patient presents with  . Leg Problem    (Consider location/radiation/quality/duration/timing/severity/associated sxs/prior treatment) The history is provided by the patient and a relative. No language interpreter was used.   Emma Yu is a 78 y.o. female who presents to the Emergency Department complaining of nausea and tremors onset several days ago. Patient has been taking Clindamycin (300 mg 3x/day) for a leg infection. Patient is worried that Clindamycin could be causing increased tremors and nausea. Patient ate breakfast this morning. Pt with h/o of DVT, PAD, diabetes, HTN, hyperlipidemia, afib, DJD of hip, aortic stenosis, mitral stenosis, anemia, CAD and acute respiratory failure. Patient with surgical h/o of total hip arthroplasty, lumbar disc surgery and cardiac catheterization (12/31/2010). Patient is a former smoker (quit date - 08/20/1985).  Previous Chart Review: Patient was seen in the ED on 01/24/12 by Dr. Golda Acre. Patient presented with bilateral lower extremities swelling and redness. Patient was referred to the ED at urgent care for further evaluation. Dr. Diagnosed patient with cellulitis is lower extremities bilaterally and that left leg was slightly bigger than the right and patient's history with DVT was considered for diagnosis. Patient was given a dose of Lovenox in the ED and was prescribed Clindamycin. Patient had an ultrasound performed the following day.  PCP - Yetta Barre  Past Medical History  Diagnosis Date  . PAD (peripheral artery disease)     s/p bilateral comon iliac artery stenting in 2002. Known significant  R SFA  disease. carotid dz noted 11/2011 followed by Dr. Imogene Burn  .  Diabetes mellitus   . Hypertension   . Hyperlipidemia   . Venous insufficiency   . Chronic diastolic heart failure     Echocardiogram 12/10/11: Moderate LVH, EF 65-70%, moderate aortic stenosis, mean gradient 20, mild MS  . Atrial fibrillation     not a coumadin candidate secondary to fall risk  . DJD (degenerative joint disease) of hip     s/p R THR 10/2010  . Aortic stenosis     Moderate by echo 4/13 - mean 20 mmHg  . Mitral stenosis     Mild by echo 4/13  . Anemia     iron deficient  . Acute respiratory failure     12/2011 admission - decreased O2 sats on ambulation, improved by time of discharge  . CAD (coronary artery disease)     minimal plaque by cath 5/12    Past Surgical History  Procedure Date  . Total hip arthroplasty 1991, 1994    redo in 1994  . Lumbar disc surgery 1999  . Cardiac catheterization 01/10/2011    No significant CAD    Family History  Problem Relation Age of Onset  . Diabetes Father   . Diabetes Other     5/8 sibs    History  Substance Use Topics  . Smoking status: Former Smoker -- 2.0 packs/day for 25 years    Types: Cigarettes    Quit date: 08/20/1985  . Smokeless tobacco: Never Used   Comment: Retired-widowed 89, lives with son  . Alcohol Use: No    OB History    Grav Para Term Preterm Abortions TAB SAB Ect  Mult Living                  Review of Systems A complete 10 system review of systems was obtained and all systems are negative except as noted in the HPI and PMH.   Allergies  Carvedilol; Clindamycin/lincomycin; Diltiazem hcl; Fenofibrate; Hctz; Nisoldipine; Nitroglycerin; Propranolol; Derl Barrow  Home Medications   Current Outpatient Rx  Name Route Sig Dispense Refill  . ACETAMINOPHEN 325 MG PO TABS Oral Take 325 mg by mouth daily as needed. for pain.    Marland Kitchen VITAMIN C 500 MG PO TABS Oral Take 500 mg by mouth daily.      . ASPIRIN EC 81 MG PO TBEC Oral Take 81 mg by mouth 2 (two) times daily.    . B COMPLEX PO  TABS Oral Take 1 tablet by mouth daily.      Marland Kitchen VITAMIN D3 2000 UNITS PO CAPS Oral Take 2,000 Units by mouth daily.      Marland Kitchen CLONIDINE HCL 0.1 MG PO TABS Oral Take 1 tablet (0.1 mg total) by mouth 2 (two) times daily. 60 tablet 0  . CO Q 10 100 MG PO CAPS Oral Take 100 mg by mouth daily.     Marland Kitchen DILTIAZEM HCL ER COATED BEADS 240 MG PO CP24 Oral Take 1 capsule (240 mg total) by mouth daily. 30 capsule 6  . FERROUS SULFATE 325 (65 FE) MG PO TABS Oral Take 1 tablet (325 mg total) by mouth 3 (three) times daily with meals. 90 tablet 1    Follow up with your primary doctor for your anemia ...  . OMEGA-3 FATTY ACIDS 1000 MG PO CAPS Oral Take 2 g by mouth daily.      . FUROSEMIDE 40 MG PO TABS Oral Take 80 mg by mouth 2 (two) times daily.    Marland Kitchen GABAPENTIN 300 MG PO CAPS Oral Take 600 mg by mouth at bedtime.    . INSULIN PUMP Subcutaneous Inject into the skin continuous. humalog    . METOPROLOL TARTRATE 25 MG PO TABS Oral Take 25-50 mg by mouth 2 (two) times daily. 2 tablets (50 mg) every morning and 1 tablet (25 mg) at bedtime    . OMEPRAZOLE 40 MG PO CPDR Oral Take 40 mg by mouth daily before breakfast.     . PRAVASTATIN SODIUM 20 MG PO TABS Oral Take 20 mg by mouth every evening.    Marland Kitchen PROBIOTIC PO Oral Take 1 tablet by mouth daily at 10 pm.    . TRAMADOL HCL 50 MG PO TABS Oral Take 1 tablet (50 mg total) by mouth every 6 (six) hours as needed. For pain 120 tablet 0  . ONDANSETRON 8 MG PO TBDP Oral Take 1 tablet (8 mg total) by mouth every 8 (eight) hours as needed for nausea. 20 tablet 0    BP 118/60  Pulse 88  Temp(Src) 98.6 F (37 C) (Oral)  Resp 20  SpO2 95%  Physical Exam  Nursing note and vitals reviewed. Constitutional: [name] oriented to person, place, and time. She appears well-developed and well-nourished. No distress.  HENT:  Head: Normocephalic and atraumatic.  Eyes: Conjunctivae and EOM are normal.  Neck: Neck supple. No tracheal deviation present.  Cardiovascular: Normal rate.         Pulse is between 70-80 : Irregular.  Pulmonary/Chest: Effort normal. No respiratory distress.  Abdominal: She exhibits no distension.  Musculoskeletal: Normal range of motion.  Neurological: [name] alert and oriented  to person, place, and time. She displays tremor (Benign intention tremor). No sensory deficit.  Skin: Skin is dry. There is erythema (Within the lines).       Cellulitis is improving. Legs are still red and slightly swollen bilaterally. Left is greater than right. Erythema is within the lines.  Psychiatric: She has a normal mood and affect. Her behavior is normal.    ED Course  Procedures (including critical care time) DIAGNOSTIC STUDIES: Oxygen Saturation is 95% on room air, adequate by my interpretation.    COORDINATION OF CARE: 1:39PM - Patient informed of current plan for treatment and evaluation and agrees with plan at this time. Patient informed that after research, pharmacist and I could find no correlation between tremors and Clindamycin. Let patient know that Clindamycin often causes nausea and recommend that patient take 1-150 mg pill instead of 2. Also recommended that patient apply a heating pad or warm compress to legs. Will also prescribe Zofran for nausea.    Labs Reviewed - No data to display No results found.   1. Tremor   2. Cellulitis   3. Nausea       MDM  Resolving leg cellulitis with gastric distress due to 2 high dose clindamycin. The tremor that she is concerned about is unlikely related to a drug interaction or drug reaction. Doubt metabolic instability, serious bacterial infection or impending vascular collapse; the patient is stable for discharge.      I personally performed the services described in this documentation, which was scribed in my presence. The recorded information has been reviewed and considered.   Plan: Home Medications- decrease Clindamycin dose by 50%; Home Treatments- elevate feet; Recommended follow up- PCP in 1  week  Flint Melter, MD 01/26/12 2048

## 2012-01-28 ENCOUNTER — Telehealth: Payer: Self-pay

## 2012-01-28 DIAGNOSIS — L89109 Pressure ulcer of unspecified part of back, unspecified stage: Secondary | ICD-10-CM

## 2012-01-28 DIAGNOSIS — I509 Heart failure, unspecified: Secondary | ICD-10-CM

## 2012-01-28 DIAGNOSIS — L8992 Pressure ulcer of unspecified site, stage 2: Secondary | ICD-10-CM

## 2012-01-28 DIAGNOSIS — Z5189 Encounter for other specified aftercare: Secondary | ICD-10-CM

## 2012-01-28 NOTE — Telephone Encounter (Signed)
Dorinda Hill called requesting verbal authorization to see pt this week because she missed last week (Pt has doopler to r/o DVT and visit was cancelled).

## 2012-01-28 NOTE — Telephone Encounter (Signed)
See other phone note

## 2012-01-28 NOTE — Telephone Encounter (Signed)
ok 

## 2012-01-28 NOTE — Telephone Encounter (Signed)
OT informed via VM

## 2012-01-30 ENCOUNTER — Ambulatory Visit (INDEPENDENT_AMBULATORY_CARE_PROVIDER_SITE_OTHER): Payer: Medicare Other | Admitting: Internal Medicine

## 2012-01-30 ENCOUNTER — Telehealth: Payer: Self-pay | Admitting: Cardiovascular Disease

## 2012-01-30 ENCOUNTER — Ambulatory Visit: Payer: Medicare Other | Admitting: Internal Medicine

## 2012-01-30 ENCOUNTER — Encounter: Payer: Self-pay | Admitting: Internal Medicine

## 2012-01-30 VITALS — BP 122/66 | HR 79 | Temp 97.0°F | Resp 16 | Wt 145.0 lb

## 2012-01-30 DIAGNOSIS — I798 Other disorders of arteries, arterioles and capillaries in diseases classified elsewhere: Secondary | ICD-10-CM

## 2012-01-30 DIAGNOSIS — I10 Essential (primary) hypertension: Secondary | ICD-10-CM

## 2012-01-30 DIAGNOSIS — E1059 Type 1 diabetes mellitus with other circulatory complications: Secondary | ICD-10-CM

## 2012-01-30 DIAGNOSIS — E1051 Type 1 diabetes mellitus with diabetic peripheral angiopathy without gangrene: Secondary | ICD-10-CM

## 2012-01-30 DIAGNOSIS — I739 Peripheral vascular disease, unspecified: Secondary | ICD-10-CM

## 2012-01-30 MED ORDER — VALSARTAN 320 MG PO TABS: 320.0000 mg | ORAL_TABLET | Freq: Every day | ORAL | Status: DC

## 2012-01-30 NOTE — Assessment & Plan Note (Signed)
Her recent a1c looks great 

## 2012-01-30 NOTE — Progress Notes (Signed)
  Subjective:    Patient ID: Emma Yu, female    DOB: [birth year], 78 y.o.   MRN: [id number]  Hypertension This is a chronic problem. The current episode started more than 1 year ago. The problem is unchanged. The problem is controlled. Associated symptoms include peripheral edema. Pertinent negatives include no anxiety, blurred vision, chest pain, headaches, malaise/fatigue, neck pain, orthopnea, palpitations, PND, shortness of breath or sweats. Past treatments include diuretics, central alpha agonists, calcium channel blockers and beta blockers. The current treatment provides significant improvement. Compliance problems include medication side effects.       Review of Systems  Constitutional: Negative.  Negative for malaise/fatigue.  HENT: Negative.  Negative for neck pain.   Eyes: Negative.  Negative for blurred vision.  Respiratory: Negative.  Negative for shortness of breath.   Cardiovascular: Positive for leg swelling. Negative for chest pain, palpitations, orthopnea and PND.  Gastrointestinal: Negative.   Genitourinary: Negative.   Musculoskeletal: Negative.   Skin: Negative for color change, pallor, rash and wound.  Neurological: Negative.  Negative for headaches.  Hematological: Negative.   Psychiatric/Behavioral: Negative.        Objective:   Physical Exam  Vitals reviewed. Constitutional: [name] oriented to person, place, and time. She appears well-developed and well-nourished. No distress.  HENT:  Head: Normocephalic and atraumatic.  Mouth/Throat: Oropharynx is clear and moist. No oropharyngeal exudate.  Eyes: Conjunctivae are normal. Right eye exhibits no discharge. Left eye exhibits no discharge. No scleral icterus.  Neck: Normal range of motion. Neck supple. No JVD present. No tracheal deviation present. No thyromegaly present.  Cardiovascular: Normal rate, regular rhythm and intact distal pulses.  Exam reveals no gallop and no friction rub.   Murmur  heard. Pulmonary/Chest: Effort normal and breath sounds normal. No stridor. No respiratory distress. She has no wheezes. She has no rales. She exhibits no tenderness.  Abdominal: Soft. Bowel sounds are normal. She exhibits no distension. There is no tenderness. There is no rebound and no guarding.  Musculoskeletal: Normal range of motion. She exhibits edema (1+ edema and erythema ). She exhibits no tenderness.  Lymphadenopathy:    She has no cervical adenopathy.  Neurological: [name] oriented to person, place, and time.  Skin: Skin is warm and dry. No rash noted. [name] not diaphoretic. No erythema. No pallor.  Psychiatric: She has a normal mood and affect. Her behavior is normal. Judgment normal.      Lab Results  Component Value Date   WBC 6.5 01/24/2012   HGB 12.7 01/24/2012   HCT 41.0 01/24/2012   PLT 186 01/24/2012   GLUCOSE 101* 01/24/2012   CHOL 109 09/10/2011   TRIG 121.0 09/10/2011   HDL 49.90 09/10/2011   LDLCALC 35 09/10/2011   ALT 9 12/10/2011   AST 14 12/10/2011   NA 142 01/24/2012   K 3.4* 01/24/2012   CL 97 01/24/2012   CREATININE 1.00 01/24/2012   BUN 21 01/24/2012   CO2 31 01/24/2012   TSH 0.511 12/10/2011   INR 1.18 12/25/2011   HGBA1C 5.8 01/17/2012      Assessment & Plan:

## 2012-01-30 NOTE — Telephone Encounter (Signed)
New Problem:    Would like to request more visits to help with the patient's cellulitis.  Please call back.

## 2012-01-30 NOTE — Telephone Encounter (Signed)
[facility] calling to get order for pt to have extra visit for treatment of cellulitis--extra visits OK ed

## 2012-01-30 NOTE — Assessment & Plan Note (Signed)
Since I last saw her she has been placed on a CCB and subsequently developed painful redness and swelling in her LE's, she has been to the ER and was told that she had cellulitis but she is not getting any better with antibiotics, I think the edema is due to the CCB therapy and have therefore asked her to stop taking it, I have asked her to restart diovan

## 2012-01-30 NOTE — Patient Instructions (Signed)

## 2012-02-11 ENCOUNTER — Ambulatory Visit (INDEPENDENT_AMBULATORY_CARE_PROVIDER_SITE_OTHER): Payer: Medicare Other | Admitting: Physician Assistant

## 2012-02-11 ENCOUNTER — Encounter: Payer: Self-pay | Admitting: Physician Assistant

## 2012-02-11 VITALS — BP 108/56 | HR 71 | Ht 64.0 in | Wt 148.0 lb

## 2012-02-11 DIAGNOSIS — I350 Nonrheumatic aortic (valve) stenosis: Secondary | ICD-10-CM

## 2012-02-11 DIAGNOSIS — I5032 Chronic diastolic (congestive) heart failure: Secondary | ICD-10-CM

## 2012-02-11 DIAGNOSIS — I6529 Occlusion and stenosis of unspecified carotid artery: Secondary | ICD-10-CM

## 2012-02-11 DIAGNOSIS — I359 Nonrheumatic aortic valve disorder, unspecified: Secondary | ICD-10-CM

## 2012-02-11 DIAGNOSIS — I4891 Unspecified atrial fibrillation: Secondary | ICD-10-CM

## 2012-02-11 DIAGNOSIS — I10 Essential (primary) hypertension: Secondary | ICD-10-CM

## 2012-02-11 MED ORDER — METOPROLOL TARTRATE 25 MG PO TABS: 25.0000 mg | ORAL_TABLET | Freq: Two times a day (BID) | ORAL | Status: DC

## 2012-02-11 MED ORDER — FUROSEMIDE 40 MG PO TABS: ORAL_TABLET | ORAL | Status: DC

## 2012-02-11 NOTE — Patient Instructions (Addendum)
Your physician recommends that you schedule a follow-up appointment in: 04/17/12 WITH DR. Eden Emms  Your physician has requested that you have a carotid duplex DX CAD AND A-FIB THIS IS TO BE DONE SOMETIME April 2014. This test is an ultrasound of the carotid arteries in your neck. It looks at blood flow through these arteries that supply the brain with blood. Allow one hour for this exam. There are no restrictions or special instructions.   Your physician has recommended you make the following change in your medication: CHANGE METOPROLOL TO 25 MG 1 TABLET TWICE DAILY  TAKE LASIX 40 MG TABLET TAKE 2 TABS IN THE MORNING TO = 80 MG IN THE AM AND TAKE 1 TABLET IN AFTERNOON = 40 MG

## 2012-02-11 NOTE — Progress Notes (Signed)
[address] [place], [place]  [address] Phone: [phone] Fax:  [phone]  Date:  02/11/2012   Name:  Emma Yu   DOB:  1927/04/03   MRN:  [id number]  PCP:  Sanda Linger, MD  Primary Cardiologist:  Dr. Charlton Haws  Primary Electrophysiologist:  None    History of Present Illness: Emma Yu is a 78 y.o. female who returns for post hospital follow up.  Previously followed by Dr. Lorine Bears and now sees Dr. Charlton Haws.  She has a history of mod aortic stenosis, Atrial fibrillation, diastolic heart failure, peripheral arterial disease, status post bilateral common iliac artery stenting in 2002 with known significant right SFA disease, diabetes, hypertension, hyperlipidemia.  LHC 01/10/11: Luminal irregularities.    She was admitted 4/20-4/30.  Prior to that, she had been seen at [facility] after suffering a fall resulting in laceration to her head.  When she presented to [facility] she was noted to be anemic and dehydrated and in acute renal failure in the setting of diarrhea.  She developed atrial fibrillation with rapid ventricular rate.  She was rate controlled.  She was not placed on anticoagulation secondary to significant fall risk.  She also developed volume overload due to rehydration.  This was treated with IV diuresis.    Carotid Dopplers 4/13: R ICA 60-79%; LICA 40-59% Echocardiogram 12/10/11: Moderate LVH, EF 65-70%, moderate aortic stenosis, mean gradient 20, mild MS.  She was re-admitted 5/7-5/9 with a/c diastolic CHF.  Chest x-ray confirmed CHF with associated bilateral effusions.  She was diuresed.  She was again felt to be a poor candidate for Coumadin secondary to fall risk.  She was seen by internal medicine due to reported history of dark stools and anemia.  She was Hemoccult negative.  She was iron deficient.  She has a history of right hemicolectomy in 2002.  She was placed on iron.  She was placed on Levaquin for  her urinary tract infection.  She was hypoxic and pulmonology evaluated her.  This was not felt to be related to pneumonia but rather CHF and effusions, plus/minus COPD.  Follow up chest x-ray was recommended.  She was discharged to [facility].    I saw her 5/23 in followup.  I adjusted her Lasix.  A follow up chest x-ray was okay.  She has been to the emergency room with diagnosis of cellulitis since that time and prescribed antibiotics.  She denies significant changes in her shortness of breath.  She denies palpitations.  She denies chest pain.  She denies orthopnea, PND.  Her lower extremity edema is stable.  Her weight has increased 3-4 pounds over the last several days.  However, she has missed a couple of doses of Lasix.   Wt Readings from Last 3 Encounters:  02/11/12 148 lb (67.132 kg)  01/30/12 145 lb (65.772 kg)  01/24/12 145 lb (65.772 kg)     Potassium  Date/Time Value Range Status  01/24/2012  7:08 PM 3.4* 3.5 - 5.1 mEq/L Final     Creatinine, Ser  Date/Time Value Range Status  01/24/2012  7:08 PM 1.00  0.50 - 1.10 mg/dL Final     ALT  Date/Time Value Range Status  12/10/2011  4:05 AM 9  0 - 35 U/L Final     TSH  Date/Time Value Range Status  12/10/2011  4:05 AM 0.511  0.350 - 4.500 uIU/mL Final     Hemoglobin  Date/Time Value Range Status  01/24/2012  7:08 PM 12.7  12.0 - 15.0 g/dL Final    Past Medical History  Diagnosis Date  . PAD (peripheral artery disease)     s/p bilateral comon iliac artery stenting in 2002. Known significant  R SFA  disease. carotid dz noted 11/2011 followed by Dr. Imogene Burn  . Diabetes mellitus   . Hypertension   . Hyperlipidemia   . Venous insufficiency   . Chronic diastolic heart failure     Echocardiogram 12/10/11: Moderate LVH, EF 65-70%, moderate aortic stenosis, mean gradient 20, mild MS  . Atrial fibrillation     not a coumadin candidate secondary to fall risk  . DJD (degenerative joint disease) of hip     s/p R THR 10/2010  . Aortic  stenosis     Moderate by echo 4/13 - mean 20 mmHg  . Mitral stenosis     Mild by echo 4/13  . Anemia     iron deficient  . Acute respiratory failure     12/2011 admission - decreased O2 sats on ambulation, improved by time of discharge  . CAD (coronary artery disease)     minimal plaque by cath 5/12    Current Outpatient Prescriptions  Medication Sig Dispense Refill  . acetaminophen (TYLENOL) 325 MG tablet Take 325 mg by mouth daily as needed. for pain.      . Ascorbic Acid (VITAMIN C) 500 MG tablet Take 500 mg by mouth daily.        Marland Kitchen aspirin EC 81 MG tablet Take 81 mg by mouth 2 (two) times daily.      Marland Kitchen b complex vitamins tablet Take 1 tablet by mouth daily.        . Cholecalciferol (VITAMIN D3) 2000 UNITS capsule Take 2,000 Units by mouth daily.        . clindamycin (CLEOCIN) 150 MG capsule Take 150 mg by mouth 3 (three) times daily.      . cloNIDine (CATAPRES) 0.1 MG tablet Take 1 tablet (0.1 mg total) by mouth 2 (two) times daily.  60 tablet  0  . Coenzyme Q10 (CO Q 10) 100 MG CAPS Take 100 mg by mouth daily.       . ferrous sulfate 325 (65 FE) MG tablet Take 1 tablet (325 mg total) by mouth 3 (three) times daily with meals.  90 tablet  1  . fish oil-omega-3 fatty acids 1000 MG capsule Take 2 g by mouth daily.        . furosemide (LASIX) 40 MG tablet Take 80 mg by mouth 2 (two) times daily.      Marland Kitchen gabapentin (NEURONTIN) 300 MG capsule Take 600 mg by mouth at bedtime.      . Insulin Human (INSULIN PUMP) 100 unit/ml SOLN Inject into the skin continuous. humalog      . metoprolol tartrate (LOPRESSOR) 25 MG tablet Take 25-50 mg by mouth 2 (two) times daily. 2 tablets (50 mg) every morning and 1 tablet (25 mg) at bedtime      . omeprazole (PRILOSEC) 40 MG capsule Take 40 mg by mouth daily before breakfast.       . pravastatin (PRAVACHOL) 20 MG tablet Take 20 mg by mouth every evening.      . Probiotic Product (PROBIOTIC PO) Take 1 tablet by mouth daily at 10 pm.      . traMADol  (ULTRAM) 50 MG tablet Take 1 tablet (50 mg total) by mouth every 6 (six) hours as needed. For pain  120 tablet  0  . valsartan (DIOVAN) 320 MG tablet Take 1 tablet (320 mg total) by mouth daily.  90 tablet  3    Allergies: Allergies  Allergen Reactions  . Carvedilol Other (See Comments)    Heart stops  . Clindamycin/Lincomycin Other (See Comments)    tremors  . Diltiazem Hcl Other (See Comments)    Chest pain  . Fenofibrate Other (See Comments)    Unknown - [facility] - MAR  . Hctz (Hydrochlorothiazide) Other (See Comments)    Chest pain  . Nisoldipine Other (See Comments)    Chest pain  . Nitroglycerin Other (See Comments)    Unknown - [facility] MAR  . Propranolol Diarrhea    Chest pain  . Tekturna (Aliskiren Fumarate) Other (See Comments)    Unknown reaction  . Valturna (Aliskiren-Valsartan) Other (See Comments)    Unknown reaction    History  Substance Use Topics  . Smoking status: Former Smoker -- 2.0 packs/day for 25 years    Types: Cigarettes    Quit date: 08/20/1985  . Smokeless tobacco: Never Used   Comment: Retired-widowed 89, lives with son  . Alcohol Use: No     ROS:  Please see the history of present illness.   She has noted increasing tremors.  She follows with Dr. Modesto Charon.  All other systems reviewed and negative.   PHYSICAL EXAM: VS:  BP 108/56  Pulse 71  Ht 5\' 4"  (1.626 m)  Wt 148 lb (67.132 kg)  BMI 25.40 kg/m2 Well nourished, well developed, in no acute distress HEENT: normal Neck: no  JVD at 90 degrees Cardiac:  normal S1, S2; Irregularly irregular rhythm;  Lungs:  Clear to auscultation bilaterally, no wheezing, rhonchi, rales Abd: soft, nontender, no hepatomegaly Ext: tight 1+ bilateral LE edema Skin: warm and dry Neuro:  CNs 2-12 intact, no focal abnormalities noted  EKG:  Atrial fibrillation, heart rate 71, left axis deviation, nonspecific ST-T wave changes   ASSESSMENT AND PLAN:  1.  Chronic Diastolic Congestive Heart Failure Volume overall stable.   [name] actually taking a higher dose of Lasix than what we have listed in her chart.  We will correct her medication list.  I will check a basic metabolic panel.  Continue current Rx.  She missed a few doses of Lasix.  She will restart this.  2.  Atrial Fibrillation High risk for coumadin. Diltiazem was recently discontinued by her PCP. Rate is controlled. [name] taking Metoprolol 100 mg in AM and 25 mg in PM but has not had AM dose in several days. Since rate is controlled, change metoprolol to 25 mg bid.  3.  Aortic Stenosis Moderate by recent echo.  4.  Hypertension Controlled.  Continue current therapy.   5.  Carotid Stenosis [name] due for follow up in 11/2012. [name] on ASA.   SignedTereso Newcomer, PA-C  4:01 PM 02/11/2012

## 2012-02-12 ENCOUNTER — Telehealth: Payer: Self-pay | Admitting: *Deleted

## 2012-02-12 DIAGNOSIS — I5032 Chronic diastolic (congestive) heart failure: Secondary | ICD-10-CM

## 2012-02-12 LAB — BASIC METABOLIC PANEL
BUN: 51 mg/dL — ABNORMAL HIGH (ref 6–23)
CO2: 34 mEq/L — ABNORMAL HIGH (ref 19–32)
Calcium: 9.7 mg/dL (ref 8.4–10.5)
GFR: 39.84 mL/min — ABNORMAL LOW (ref >60.00)
Glucose, Bld: 70 mg/dL (ref 70–99)

## 2012-02-12 NOTE — Telephone Encounter (Signed)
LMTCB regarding lab results and medication change. Mylo Red RN

## 2012-02-12 NOTE — Telephone Encounter (Signed)
Son is aware of medication changes with lasix. Pt will return on 02/25/12 for repeat bmp. Mylo Red RN

## 2012-02-12 NOTE — Telephone Encounter (Signed)
Message copied by Tarri Fuller on Tue Feb 12, 2012  4:59 PM ------      Message from: [place], [place] T      Created: Tue Feb 12, 2012  4:23 PM       Decrease Lasix to 40 bid      Check bmet in 2 weeks      Tereso Newcomer, [place]  4:23 PM 02/12/2012

## 2012-02-25 ENCOUNTER — Other Ambulatory Visit (INDEPENDENT_AMBULATORY_CARE_PROVIDER_SITE_OTHER): Payer: Medicare Other

## 2012-02-25 DIAGNOSIS — I5032 Chronic diastolic (congestive) heart failure: Secondary | ICD-10-CM

## 2012-02-26 LAB — BASIC METABOLIC PANEL
BUN: 46 mg/dL — ABNORMAL HIGH (ref 6–23)
CO2: 29 mEq/L (ref 19–32)
Calcium: 10.2 mg/dL (ref 8.4–10.5)
Creatinine, Ser: 1.6 mg/dL — ABNORMAL HIGH (ref 0.4–1.2)

## 2012-02-27 ENCOUNTER — Telehealth: Payer: Self-pay | Admitting: *Deleted

## 2012-02-27 ENCOUNTER — Other Ambulatory Visit: Payer: Self-pay | Admitting: *Deleted

## 2012-02-27 DIAGNOSIS — I4891 Unspecified atrial fibrillation: Secondary | ICD-10-CM

## 2012-02-27 NOTE — Telephone Encounter (Signed)
Message copied by Tarri Fuller on Wed Feb 27, 2012 10:42 AM ------      Message from: [place], [place] T      Created: Tue Feb 26, 2012  4:48 PM       Ask how her weights and swelling is doing.      May be getting too dry.        -  Let me know if edema worse or weight going up.      Decrease Lasix to 40 mg QD for 2 days, then continue with Lasix 60 mg QD.      Repeat BMET in one week.      Weigh daily and call if:  Weight up 3 lbs in one day, increased swelling or increased dyspnea.       Tereso Newcomer, PA-C  4:48 PM 02/26/2012

## 2012-02-27 NOTE — Telephone Encounter (Signed)
s/w RN April from [place] [phone] states pt's bp @ 12 pm was 91/48 and HR was 82 @ 1:45 was 121/49. RN states pt did not take her clonidine @ lunch due to hypotension. RN wants to know if we need to change anything with meds, I will check w/PA

## 2012-02-27 NOTE — Telephone Encounter (Signed)
Pt notified of lab results and changes.

## 2012-02-27 NOTE — Telephone Encounter (Signed)
Message copied by Tarri Fuller on Wed Feb 27, 2012  2:15 PM ------      Message from: [place], [place] T      Created: Wed Feb 27, 2012  1:33 PM       Change in Lopressor ok      Did she say how her swelling is doing?      Tereso Newcomer, PA-C  1:33 PM 02/27/2012

## 2012-02-28 ENCOUNTER — Ambulatory Visit: Payer: Medicare Other | Admitting: Endocrinology

## 2012-02-28 ENCOUNTER — Ambulatory Visit: Payer: Medicare Other | Admitting: Internal Medicine

## 2012-02-28 ENCOUNTER — Telehealth: Payer: Self-pay | Admitting: *Deleted

## 2012-02-28 NOTE — Telephone Encounter (Signed)
d/w pt about clonidine dose, pt states she is was taking clonidine bid but decreased herself to QD now, I verified dose of clonidine w/pt and she states she is taking the 0.2 mg per bottle. I advised pt I need to d/w PA and w/cb

## 2012-02-28 NOTE — Telephone Encounter (Signed)
Message copied by Tarri Fuller on Thu Feb 28, 2012  2:51 PM ------      Message from: [place], [place] T      Created: Thu Feb 28, 2012  2:09 PM       Not sure why she is taking clonidine at lunch.  Should be taking bid.        If she is taking TID - change to twice daily.      If not symptomatic with low BP, continue to monitor for now and update [place] on BP at her next visit.      If she has been lightheaded, can adjust medications:        -  IF on clonidine tid, change to BID as above.        -  IF on clonidine BID, decrease Diovan to 160 mg QD and monitor BP      Tereso Newcomer, PA-C  2:08 PM 02/28/2012

## 2012-02-29 ENCOUNTER — Telehealth: Payer: Self-pay | Admitting: *Deleted

## 2012-02-29 NOTE — Telephone Encounter (Signed)
Message copied by Tarri Fuller on Fri Feb 29, 2012  8:27 AM ------      Message from: [place], [place] T      Created: Thu Feb 28, 2012  8:35 PM       Ok      Just take Clonidine 0.2 mg QAM for now.      Tereso Newcomer, PA-C  8:35 PM 02/28/2012

## 2012-02-29 NOTE — Telephone Encounter (Signed)
Message copied by Tarri Fuller on Fri Feb 29, 2012  8:44 AM ------      Message from: [place], [place] T      Created: Thu Feb 28, 2012  2:09 PM       Not sure why she is taking clonidine at lunch.  Should be taking bid.        If she is taking TID - change to twice daily.      If not symptomatic with low BP, continue to monitor for now and update [place] on BP at her next visit.      If she has been lightheaded, can adjust medications:        -  IF on clonidine tid, change to BID as above.        -  IF on clonidine BID, decrease Diovan to 160 mg QD and monitor BP      Tereso Newcomer, PA-C  2:08 PM 02/28/2012

## 2012-02-29 NOTE — Telephone Encounter (Signed)
lmom for RN April from [place] that we have decreased pt's clonidine to 0.2 mg every morning if any questions to call me [phone]

## 2012-02-29 NOTE — Telephone Encounter (Signed)
pt notified to take clonidine 0.2 mg QD.

## 2012-03-07 ENCOUNTER — Ambulatory Visit: Payer: Medicare Other | Admitting: *Deleted

## 2012-03-07 DIAGNOSIS — I10 Essential (primary) hypertension: Secondary | ICD-10-CM

## 2012-03-07 DIAGNOSIS — E7800 Pure hypercholesterolemia, unspecified: Secondary | ICD-10-CM

## 2012-03-07 DIAGNOSIS — I739 Peripheral vascular disease, unspecified: Secondary | ICD-10-CM

## 2012-03-07 LAB — HM MAMMOGRAPHY: HM Mammogram: NORMAL

## 2012-03-08 LAB — BASIC METABOLIC PANEL
BUN: 41 mg/dL — ABNORMAL HIGH (ref 6–23)
Calcium: 10.8 mg/dL — ABNORMAL HIGH (ref 8.4–10.5)
Potassium: 4.5 mEq/L (ref 3.5–5.3)
Sodium: 141 mEq/L (ref 135–145)

## 2012-03-14 ENCOUNTER — Encounter: Payer: Self-pay | Admitting: Neurology

## 2012-03-14 ENCOUNTER — Ambulatory Visit (INDEPENDENT_AMBULATORY_CARE_PROVIDER_SITE_OTHER): Payer: Medicare Other | Admitting: Neurology

## 2012-03-14 VITALS — BP 138/78 | HR 80 | Wt 143.0 lb

## 2012-03-14 DIAGNOSIS — R251 Tremor, unspecified: Secondary | ICD-10-CM

## 2012-03-14 DIAGNOSIS — R259 Unspecified abnormal involuntary movements: Secondary | ICD-10-CM

## 2012-03-14 MED ORDER — TRAMADOL HCL 50 MG PO TABS: 50.0000 mg | ORAL_TABLET | Freq: Four times a day (QID) | ORAL | Status: DC | PRN

## 2012-03-14 MED ORDER — CLONAZEPAM 0.5 MG PO TBDP: ORAL_TABLET | ORAL | Status: DC

## 2012-03-14 MED ORDER — CLONAZEPAM 0.25 MG PO TBDP: ORAL_TABLET | ORAL | Status: DC

## 2012-03-14 NOTE — Patient Instructions (Addendum)
clonazepam 0.25mg  tabs  take 1 at night for 5 days, then increase 1 twice a day for 5 days, then increase to 1 in the am, 2 at night for 5 days, then increase to 2 twice a day from then on.

## 2012-03-16 NOTE — Progress Notes (Signed)
Dear Dr. Yetta Barre,  I saw  Emma Yu back in [facility] for her problem with tremor and "jerking".  As you may recall, she is a 78 y.o. year old female with a history of what I felt to be essential tremor as well as diabetes, HTN, CAD, and PVD.  At her first clinic visit I had her switch from metoprolol to propranolol.  I did get an initial report that her tremor was better, but she reports today that the propranolol did not help her tremor and that it made her stomach upset.  She also says that she fell out of bed and was admitted to hospital because of her tremor.  She says that her "jerking" made her fall.  However, the jerks she describes seem to involve her whole body at times.  They can also involve an individual upper extremity, resulting in her throwing or dropping things.  They can occur anytime, not just nocturnally. She says these jerks started after her hip fracture and CABG in 2012.  [name] unsure if she started her gabapentin around that time.  Review of her renal function reveals that it has been elevated recently over the last several months.  This has been secondary to diuresis.    She also takes tramadol for back pain.    Medical history, social history, and family history were reviewed and have not changed since the last clinic visit.  Current Outpatient Prescriptions on File Prior to Visit  Medication Sig Dispense Refill  . acetaminophen (TYLENOL) 325 MG tablet Take 325 mg by mouth daily as needed. for pain.      . Ascorbic Acid (VITAMIN C) 500 MG tablet Take 500 mg by mouth daily.        Marland Kitchen aspirin EC 81 MG tablet Take 81 mg by mouth 2 (two) times daily.      Marland Kitchen b complex vitamins tablet Take 1 tablet by mouth daily.        . Cholecalciferol (VITAMIN D3) 2000 UNITS capsule Take 2,000 Units by mouth daily.        . cloNIDine (CATAPRES) 0.2 MG tablet Take 1 tablet (0.2 mg total) by mouth daily.      . Coenzyme Q10 (CO Q 10) 100 MG CAPS Take 100 mg by mouth  daily.       . ferrous sulfate 325 (65 FE) MG tablet Take 1 tablet (325 mg total) by mouth 3 (three) times daily with meals.  90 tablet  1  . fish oil-omega-3 fatty acids 1000 MG capsule Take 2 g by mouth daily.        . furosemide (LASIX) 40 MG tablet TAKE 2 TABS IN THE MORNING AND TAKE 1 TAB IN AFTERNOON  90 tablet  11  . gabapentin (NEURONTIN) 300 MG capsule Take 600 mg by mouth at bedtime.      . Insulin Human (INSULIN PUMP) 100 unit/ml SOLN Inject into the skin continuous. humalog      . metoprolol tartrate (LOPRESSOR) 25 MG tablet TAKE 1 TABLET IN THE AM AND TAKE 1 AND 1/2 TABLETS IN THE EVENING      . omeprazole (PRILOSEC) 40 MG capsule Take 40 mg by mouth daily before breakfast.       . pravastatin (PRAVACHOL) 20 MG tablet Take 20 mg by mouth every evening.      . Probiotic Product (PROBIOTIC PO) Take 1 tablet by mouth daily at 10 pm.      . traMADol Janean Sark)  50 MG tablet Take 1 tablet (50 mg total) by mouth every 6 (six) hours as needed. For pain  120 tablet  0  . valsartan (DIOVAN) 320 MG tablet Take 1 tablet (320 mg total) by mouth daily.  90 tablet  3  . clonazePAM (KLONOPIN) 0.5 MG disintegrating tablet increase to 2 tabs twice a day as directed.  60 tablet  0    Allergies  Allergen Reactions  . Carvedilol Other (See Comments)    Heart stops  . Clindamycin/Lincomycin Other (See Comments)    tremors  . Diltiazem Hcl Other (See Comments)    Chest pain  . Fenofibrate Other (See Comments)    Unknown - [facility] - MAR  . Hctz (Hydrochlorothiazide) Other (See Comments)    Chest pain  . Nisoldipine Other (See Comments)    Chest pain  . Nitroglycerin Other (See Comments)    Unknown - [facility] MAR  . Propranolol Diarrhea    Chest pain  . Septra (Sulfamethoxazole-Tmp Ds)     Rash, cyst  . Tekturna (Aliskiren Fumarate) Other (See Comments)    Unknown reaction  . Valturna (Aliskiren-Valsartan) Other (See Comments)    Unknown reaction    ROS:  13 systems were reviewed and are notable for  buring in her feet.  All other review of systems are unremarkable.  Exam: . Filed Vitals:   03/14/12 1447  BP: 138/78  Pulse: 80  Weight: 143 lb (64.864 kg)    In general, tired appearing women.    Cranial Nerves: Pupils are equally round and reactive to light. Visual fields full to confrontation. Extraocular movements are intact without nystagmus. Facial sensation and muscles of mastication are intact. Muscles of facial expression are symmetric. Hearing intact to bilateral finger rub. Tongue protrusion, uvula, palate midline.  Shoulder shrug intact.  She has a to and fro head tremor at times.  Motor:  Coarse postural tremor, no significant rest tremor.  Mild bradykinesia bilaterally.  No asterixis.  I saw a myoclonic jerk of the left arm.  Reflexes:  Absent throughout  Coordination:  terminal tremor  Gait:  Antalgic, slow,  uses walker, can stand with feet together.  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   I suspect this may be from a combination of the tramadol and gabapentin, likely exacerbated by her abnormal renal function(although this new so can't explain the whole picture).  I wanted there to try to stop the gabapentin and tramadol for at least 10 days, but she is very reluctant to do this.  I decided for now to try a very low dose of clonazepam 0.25-0.5 bid to treat the myoclonus and her essential tremor.  She will report how this works.  If it doesn't help her "jerk" then I will express her need to stop both tramadol and gabapentin for at least 10 days.  I may also get an EEG for her myoclonus if she reports worsening over the next couple of months, but frankly I do not think it is epileptic myoclonus. 2.  Essential tremor - we will see if the clonazepam helps with this as above.  The patient will call for a  consult with Dr. Arbutus Leas re her tremor and jerks.  They will report to me her progress in the meantime.  Lupita Raider Modesto Charon, MD [facility], [place]

## 2012-04-14 ENCOUNTER — Encounter: Payer: Self-pay | Admitting: Endocrinology

## 2012-04-14 ENCOUNTER — Ambulatory Visit (INDEPENDENT_AMBULATORY_CARE_PROVIDER_SITE_OTHER): Payer: Medicare Other | Admitting: Endocrinology

## 2012-04-14 ENCOUNTER — Other Ambulatory Visit (INDEPENDENT_AMBULATORY_CARE_PROVIDER_SITE_OTHER): Payer: Medicare Other

## 2012-04-14 VITALS — BP 110/62 | HR 79 | Temp 97.9°F | Ht 64.0 in | Wt 142.0 lb

## 2012-04-14 DIAGNOSIS — I798 Other disorders of arteries, arterioles and capillaries in diseases classified elsewhere: Secondary | ICD-10-CM

## 2012-04-14 DIAGNOSIS — E1059 Type 1 diabetes mellitus with other circulatory complications: Secondary | ICD-10-CM

## 2012-04-14 DIAGNOSIS — I739 Peripheral vascular disease, unspecified: Secondary | ICD-10-CM

## 2012-04-14 DIAGNOSIS — E1051 Type 1 diabetes mellitus with diabetic peripheral angiopathy without gangrene: Secondary | ICD-10-CM

## 2012-04-14 DIAGNOSIS — Z79899 Other long term (current) drug therapy: Secondary | ICD-10-CM

## 2012-04-14 LAB — HEMOGLOBIN A1C: Hgb A1c MFr Bld: 6.8 % — ABNORMAL HIGH (ref 4.6–6.5)

## 2012-04-14 NOTE — Patient Instructions (Addendum)
blood tests are being requested for you today.  You will receive a letter with results. take a basal rate to 0.9 unit/hr, 24 hrs per day.   Take a bolus of 12 units with each meal.   continue correction bolus (which some people call "sensitivity," or "insulin sensitivity ratio," or just "isr") of 1 unit for each by 25 which your glucose exceeds 120. check your blood sugar 4 times a day--before the 3 meals, and at bedtime.  also check if you have symptoms of your blood sugar being too high or too low.  please keep a record of the readings and bring it to your next appointment here.  please call us sooner if you are having low blood sugar episodes. Please make a follow-up appointment in 3 months.  Please bring the wrappers from your pump reservoir and infusion set, so we can load this information into our computer system.

## 2012-04-14 NOTE — Progress Notes (Signed)
Subjective:    Patient ID: Emma Yu, female    DOB: [birth year], 78 y.o.   MRN: [id number]  HPI Pt returns for f/u of type 1 dm (dx'ed 1999; complicated by CAD and PAD; she has a one-touch ping insulin pump).  She continues to exceed the prescribed insulin pump dosages.  she brings a record of her cbg's which i have reviewed today.  It varies from 88-240, but most are in the 100's.  It is in general higher as the day goes on.  Past Medical History  Diagnosis Date  . PAD (peripheral artery disease)     s/p bilateral comon iliac artery stenting in 2002. Known significant  R SFA  disease. carotid dz noted 11/2011 followed by Dr. Imogene Burn  . Diabetes mellitus   . Hypertension   . Hyperlipidemia   . Venous insufficiency   . Chronic diastolic heart failure     Echocardiogram 12/10/11: Moderate LVH, EF 65-70%, moderate aortic stenosis, mean gradient 20, mild MS  . Atrial fibrillation     not a coumadin candidate secondary to fall risk  . DJD (degenerative joint disease) of hip     s/p R THR 10/2010  . Aortic stenosis     Moderate by echo 4/13 - mean 20 mmHg  . Mitral stenosis     Mild by echo 4/13  . Anemia     iron deficient  . Acute respiratory failure     12/2011 admission - decreased O2 sats on ambulation, improved by time of discharge  . CAD (coronary artery disease)     minimal plaque by cath 5/12    Past Surgical History  Procedure Date  . Total hip arthroplasty 1991, 1994    redo in 1994  . Lumbar disc surgery 1999  . Cardiac catheterization 01/10/2011    No significant CAD    History   Social History  . Marital Status: Widowed    Spouse Name: N/A    Number of Children: N/A  . Years of Education: N/A   Occupational History  .      retired   Social History Main Topics  . Smoking status: Former Smoker -- 2.0 packs/day for 25 years    Types: Cigarettes    Quit date: 08/20/1985  . Smokeless tobacco: Never Used   Comment: Retired-widowed 89, lives with son  .  Alcohol Use: No  . Drug Use: No  . Sexually Active: Not Currently   Other Topics Concern  . Not on file   Social History Narrative   Lives with sonWidowed 1991Quit smoking in 1987    Current Outpatient Prescriptions on File Prior to Visit  Medication Sig Dispense Refill  . acetaminophen (TYLENOL) 325 MG tablet Take 325 mg by mouth daily as needed. for pain.      . Ascorbic Acid (VITAMIN C) 500 MG tablet Take 500 mg by mouth daily.        Marland Kitchen aspirin EC 81 MG tablet Take 81 mg by mouth 2 (two) times daily.      Marland Kitchen b complex vitamins tablet Take 1 tablet by mouth daily.        . Cholecalciferol (VITAMIN D3) 2000 UNITS capsule Take 2,000 Units by mouth daily.        . cloNIDine (CATAPRES) 0.2 MG tablet Take 1 tablet (0.2 mg total) by mouth daily.      . Coenzyme Q10 (CO Q 10) 100 MG CAPS Take 100 mg by mouth daily.       Marland Kitchen  ferrous sulfate 325 (65 FE) MG tablet Take 1 tablet (325 mg total) by mouth 3 (three) times daily with meals.  90 tablet  1  . fish oil-omega-3 fatty acids 1000 MG capsule Take 2 g by mouth daily.        . furosemide (LASIX) 40 MG tablet TAKE 2 TABS IN THE MORNING AND TAKE 1 TAB IN AFTERNOON  90 tablet  11  . gabapentin (NEURONTIN) 300 MG capsule Take 600 mg by mouth at bedtime.      . metoprolol tartrate (LOPRESSOR) 25 MG tablet TAKE 1 TABLET IN THE AM AND TAKE 1 AND 1/2 TABLETS IN THE EVENING      . omeprazole (PRILOSEC) 40 MG capsule Take 40 mg by mouth daily before breakfast.       . pravastatin (PRAVACHOL) 20 MG tablet Take 20 mg by mouth every evening.      . Probiotic Product (PROBIOTIC PO) Take 1 tablet by mouth daily at 10 pm.      . traMADol (ULTRAM) 50 MG tablet Take 1 tablet (50 mg total) by mouth every 6 (six) hours as needed. For pain  120 tablet  0  . valsartan (DIOVAN) 320 MG tablet Take 1 tablet (320 mg total) by mouth daily.  90 tablet  3    Allergies  Allergen Reactions  . Carvedilol Other (See Comments)    Heart stops  . Clindamycin/Lincomycin  Other (See Comments)    tremors  . Diltiazem Hcl Other (See Comments)    Chest pain  . Fenofibrate Other (See Comments)    Unknown - [facility] - MAR  . Hctz (Hydrochlorothiazide) Other (See Comments)    Chest pain  . Nisoldipine Other (See Comments)    Chest pain  . Nitroglycerin Other (See Comments)    Unknown - [facility] MAR  . Propranolol Diarrhea    Chest pain  . Septra (Sulfamethoxazole-Tmp Ds)     Rash, cyst  . Tekturna (Aliskiren Fumarate) Other (See Comments)    Unknown reaction  . Valturna (Aliskiren-Valsartan) Other (See Comments)    Unknown reaction    Family History  Problem Relation Age of Onset  . Diabetes Father   . Diabetes Other     5/8 sibs    BP 110/62  Pulse 79  Temp 97.9 F (36.6 C) (Oral)  Ht 5\' 4"  (1.626 m)  Wt 142 lb (64.411 kg)  BMI 24.37 kg/m2  SpO2 98%  Review of Systems denies hypoglycemia    Objective:   Physical Exam Pulses: dorsalis pedis are absent bilaterally.  Feet: no deformity. no ulcer on the feet. feet are of normal color and temp. no edema. There is mild erythema of the legs. There are bilat varicosities. There is bilateral onychomycosis.   Neuro: sensation is intact to touch on the feet.       Assessment & Plan:  DM; uncertain control

## 2012-04-15 ENCOUNTER — Other Ambulatory Visit: Payer: Self-pay

## 2012-04-15 MED ORDER — PRAVASTATIN SODIUM 20 MG PO TABS: 20.0000 mg | ORAL_TABLET | Freq: Every evening | ORAL | Status: DC

## 2012-04-15 MED ORDER — GABAPENTIN 300 MG PO CAPS: 600.0000 mg | ORAL_CAPSULE | Freq: Every day | ORAL | Status: DC

## 2012-04-17 ENCOUNTER — Ambulatory Visit: Payer: Medicare Other | Admitting: Cardiovascular Disease

## 2012-04-17 ENCOUNTER — Telehealth: Payer: Self-pay | Admitting: *Deleted

## 2012-04-17 NOTE — Telephone Encounter (Signed)
Called pt to inform of lab results, pt informed via VM and to callback office with any questions/concerns (letter also mailed to pt).  

## 2012-04-23 ENCOUNTER — Encounter: Payer: Self-pay | Admitting: Cardiovascular Disease

## 2012-04-23 ENCOUNTER — Ambulatory Visit (INDEPENDENT_AMBULATORY_CARE_PROVIDER_SITE_OTHER): Payer: Medicare Other | Admitting: Cardiovascular Disease

## 2012-04-23 VITALS — BP 118/60 | HR 72 | Ht 64.0 in | Wt 144.0 lb

## 2012-04-23 DIAGNOSIS — I4891 Unspecified atrial fibrillation: Secondary | ICD-10-CM

## 2012-04-23 DIAGNOSIS — I350 Nonrheumatic aortic (valve) stenosis: Secondary | ICD-10-CM

## 2012-04-23 DIAGNOSIS — I872 Venous insufficiency (chronic) (peripheral): Secondary | ICD-10-CM

## 2012-04-23 DIAGNOSIS — E7800 Pure hypercholesterolemia, unspecified: Secondary | ICD-10-CM

## 2012-04-23 DIAGNOSIS — I10 Essential (primary) hypertension: Secondary | ICD-10-CM

## 2012-04-23 DIAGNOSIS — I359 Nonrheumatic aortic valve disorder, unspecified: Secondary | ICD-10-CM

## 2012-04-23 NOTE — Assessment & Plan Note (Signed)
Good rate control and anticoagulation  

## 2012-04-23 NOTE — Assessment & Plan Note (Signed)
Cholesterol is at goal.  Continue current dose of statin and diet Rx.  No myalgias or side effects.  F/U  LFT's in 6 months. Lab Results  Component Value Date   LDLCALC 35 09/10/2011             

## 2012-04-23 NOTE — Assessment & Plan Note (Signed)
Well controlled.  Continue current medications and low sodium Dash type diet.   Clonidine decreased to daily

## 2012-04-23 NOTE — Progress Notes (Signed)
Patient ID: Emma Yu, female   DOB: 1926/12/25, 78 y.o.   MRN: [id number] Previously followed by Dr. Lorine Bears and now sees Dr. Charlton Haws. She has a history of mod aortic stenosis, Atrial fibrillation, diastolic heart failure, peripheral arterial disease, status post bilateral common iliac artery stenting in 2002 with known significant right SFA disease, diabetes, hypertension, hyperlipidemia. LHC 01/10/11: Luminal irregularities.  She was admitted 4/20-4/30. Prior to that, she had been seen at [facility] after suffering a fall resulting in laceration to her head. When she presented to [facility] she was noted to be anemic and dehydrated and in acute renal failure in the setting of diarrhea. She developed atrial fibrillation with rapid ventricular rate. She was rate controlled. She was not placed on anticoagulation secondary to significant fall risk. She also developed volume overload due to rehydration. This was treated with IV diuresis.  Carotid Dopplers 4/13: R ICA 60-79%; LICA 40-59%  Echocardiogram 12/10/11: Moderate LVH, EF 65-70%, moderate aortic stenosis, mean gradient 20, mild MS.  She was re-admitted 5/7-5/9 with a/c diastolic CHF. Chest x-ray confirmed CHF with associated bilateral effusions. She was diuresed. She was again felt to be a poor candidate for Coumadin secondary to fall risk. She was seen by internal medicine due to reported history of dark stools and anemia. She was Hemoccult negative. She was iron deficient. She has a history of right hemicolectomy in 2002. She was placed on iron. She was placed on Levaquin for her urinary tract infection. She was hypoxic and pulmonology evaluated her. This was not felt to be related to pneumonia but rather CHF and effusions, plus/minus COPD. Follow up chest x-ray was recommended. She was discharged to [facility].  I saw her 5/23 in followup. I adjusted her Lasix. A follow up chest x-ray was okay. She has been to the emergency  room with diagnosis of cellulitis since that time and prescribed antibiotics. She denies significant changes in her shortness of breath. She denies palpitations. She denies chest pain. She denies orthopnea, PND. Her lower extremity edema is stable. Her weight has increased 3-4 pounds over the last several days. However, she has missed a couple of doses of Lasix.  ROS: Denies fever, malais, weight loss, blurry vision, decreased visual acuity, cough, sputum, SOB, hemoptysis, pleuritic pain, palpitaitons, heartburn, abdominal pain, melena, lower extremity edema, claudication, or rash.  All other systems reviewed and negative  General: Affect appropriate Healthy:  appears stated age HEENT: normal Neck supple with no adenopathy JVP normal no bruits no thyromegaly Lungs clear with no wheezing and good diaphragmatic motion Heart:  S1/S2 AS murmur, no rub, gallop or click PMI normal Abdomen: benighn, BS positve, no tenderness, no AAA no bruit.  No HSM or HJR Distal pulses intact with no bruits Plus one bilateral edema  With chronic venous insuf changes Neuro non-focal Skin warm and dry No muscular weakness   Current Outpatient Prescriptions  Medication Sig Dispense Refill  . acetaminophen (TYLENOL) 325 MG tablet Take 325 mg by mouth daily as needed. for pain.      . Ascorbic Acid (VITAMIN C) 500 MG tablet Take 500 mg by mouth daily.        Marland Kitchen aspirin EC 81 MG tablet Take 81 mg by mouth 2 (two) times daily.      Marland Kitchen b complex vitamins tablet Take 1 tablet by mouth daily.        . Cholecalciferol (VITAMIN D3) 2000 UNITS capsule Take 2,000 Units by mouth daily.        Marland Kitchen  cloNIDine (CATAPRES) 0.2 MG tablet Take 1 tablet (0.2 mg total) by mouth daily.      Marland Kitchen co-enzyme Q-10 50 MG capsule Take 50 mg by mouth daily.      . ferrous sulfate 325 (65 FE) MG tablet Take 1 tablet (325 mg total) by mouth 3 (three) times daily with meals.  90 tablet  1  . fish oil-omega-3 fatty acids 1000 MG capsule Take 2 g by  mouth daily.        . furosemide (LASIX) 40 MG tablet TAKE 2 TABS IN THE MORNING AND TAKE 1 TAB IN AFTERNOON  90 tablet  11  . gabapentin (NEURONTIN) 300 MG capsule Take 2 capsules (600 mg total) by mouth at bedtime.  60 capsule  5  . insulin lispro (HUMALOG) 100 UNIT/ML injection For use in pump, total of 70 units per day      . metoprolol tartrate (LOPRESSOR) 25 MG tablet TAKE 1 TABLET IN THE AM AND TAKE 1 AND 1/2 TABLETS IN THE EVENING      . omeprazole (PRILOSEC) 40 MG capsule Take 40 mg by mouth daily before breakfast.       . pravastatin (PRAVACHOL) 20 MG tablet Take 1 tablet (20 mg total) by mouth every evening.  30 tablet  5  . Probiotic Product (PROBIOTIC PO) Take 1 tablet by mouth daily at 10 pm.      . traMADol (ULTRAM) 50 MG tablet Take 1 tablet (50 mg total) by mouth every 6 (six) hours as needed. For pain  120 tablet  0  . valsartan (DIOVAN) 320 MG tablet Take 1 tablet (320 mg total) by mouth daily.  90 tablet  3    Allergies  Carvedilol; Clindamycin/lincomycin; Diltiazem hcl; Fenofibrate; Hctz; Nisoldipine; Nitroglycerin; Propranolol; Septra; Derl Barrow  Electrocardiogram:  Assessment and Plan

## 2012-04-23 NOTE — Assessment & Plan Note (Signed)
Stable on current dose of diuretic.  Weights stable

## 2012-04-23 NOTE — Patient Instructions (Signed)
Your physician recommends that you schedule a follow-up appointment in:  3 MONTHS WITH  DR NISHAN  Your physician recommends that you continue on your current medications as directed. Please refer to the Current Medication list given to you today.  

## 2012-04-23 NOTE — Assessment & Plan Note (Signed)
No change in murmur.  Stable F/U echo in a year

## 2012-05-07 LAB — HM DIABETES EYE EXAM: HM Diabetic Eye Exam: NORMAL

## 2012-05-12 ENCOUNTER — Ambulatory Visit: Payer: Medicare Other | Admitting: Internal Medicine

## 2012-06-10 ENCOUNTER — Other Ambulatory Visit: Payer: Self-pay | Admitting: Cardiovascular Disease

## 2012-06-10 NOTE — Telephone Encounter (Signed)
New problem:  Need clarification on direction & dosage.

## 2012-06-11 MED ORDER — CLONIDINE HCL 0.2 MG PO TABS: 0.2000 mg | ORAL_TABLET | Freq: Every day | ORAL | Status: DC

## 2012-06-17 ENCOUNTER — Telehealth: Payer: Self-pay | Admitting: Internal Medicine

## 2012-06-17 DIAGNOSIS — I4891 Unspecified atrial fibrillation: Secondary | ICD-10-CM

## 2012-06-17 NOTE — Telephone Encounter (Signed)
The patient called and is hoping to get a refill of Metoprolol 25mg  , callback - [phone]

## 2012-06-18 MED ORDER — METOPROLOL TARTRATE 25 MG PO TABS: ORAL_TABLET | ORAL | Status: DC

## 2012-06-24 ENCOUNTER — Other Ambulatory Visit: Payer: Self-pay | Admitting: *Deleted

## 2012-06-24 DIAGNOSIS — I4891 Unspecified atrial fibrillation: Secondary | ICD-10-CM

## 2012-06-24 MED ORDER — METOPROLOL TARTRATE 25 MG PO TABS: ORAL_TABLET | ORAL | Status: DC

## 2012-06-24 NOTE — Telephone Encounter (Signed)
Left msg on vm requesting refill on metoprolol tartrate. Call back ref. # [id number]...lmb   Faxed script to [facility]...Raechel Chute

## 2012-07-11 ENCOUNTER — Ambulatory Visit (INDEPENDENT_AMBULATORY_CARE_PROVIDER_SITE_OTHER): Payer: Medicare Other | Admitting: Endocrinology

## 2012-07-11 ENCOUNTER — Encounter: Payer: Self-pay | Admitting: Endocrinology

## 2012-07-11 VITALS — BP 134/60 | HR 70 | Temp 98.1°F | Resp 16 | Wt 147.0 lb

## 2012-07-11 DIAGNOSIS — E1051 Type 1 diabetes mellitus with diabetic peripheral angiopathy without gangrene: Secondary | ICD-10-CM

## 2012-07-11 DIAGNOSIS — I739 Peripheral vascular disease, unspecified: Secondary | ICD-10-CM

## 2012-07-11 DIAGNOSIS — E1059 Type 1 diabetes mellitus with other circulatory complications: Secondary | ICD-10-CM

## 2012-07-11 MED ORDER — TRIAMCINOLONE ACETONIDE 0.1 % EX CREA: TOPICAL_CREAM | Freq: Three times a day (TID) | CUTANEOUS | Status: DC | PRN

## 2012-07-11 NOTE — Patient Instructions (Addendum)
blood tests are being requested for you today.  We'll contact you with results.  take a basal rate to 0.9 unit/hr, 24 hrs per day.   Take a bolus of 12 units with each meal.   continue correction bolus (which some people call "sensitivity," or "insulin sensitivity ratio," or just "isr") of 1 unit for each by 25 which your glucose exceeds 120. check your blood sugar 4 times a day--before the 3 meals, and at bedtime.  also check if you have symptoms of your blood sugar being too high or too low.  please keep a record of the readings and bring it to your next appointment here.  please call us sooner if you are having low blood sugar episodes. Please make a follow-up appointment in 3 months.   Please let me know if you want to consider a continuous glucose monitor.   i have sent a prescription to your pharmacy, for a skin cream.  This could be psoriasis.  Please ask dr Yetta Barre when you see him next.

## 2012-07-11 NOTE — Progress Notes (Signed)
Subjective:    Patient ID: Emma Yu, female    DOB: Nov 12, 1926, 78 y.o.   MRN: [id number]  HPI Pt returns for f/u of type 1 dm (dx'ed 1999; complicated by CAD and PAD; she has a one-touch ping insulin pump).  she brings a record of her cbg's which i have reviewed today.  It varies from 90-200, but most are in the 100's.  There is no trend throughout the day.  She averages a total of approx 60 units of humalog per day.  Pt states 8 months of a moderate rash at the scalp, and assoc itching. Past Medical History  Diagnosis Date  . PAD (peripheral artery disease)     s/p bilateral comon iliac artery stenting in 2002. Known significant  R SFA  disease. carotid dz noted 11/2011 followed by Dr. Imogene Burn  . Diabetes mellitus   . Hypertension   . Hyperlipidemia   . Venous insufficiency   . Chronic diastolic heart failure     Echocardiogram 12/10/11: Moderate LVH, EF 65-70%, moderate aortic stenosis, mean gradient 20, mild MS  . Atrial fibrillation     not a coumadin candidate secondary to fall risk  . DJD (degenerative joint disease) of hip     s/p R THR 10/2010  . Aortic stenosis     Moderate by echo 4/13 - mean 20 mmHg  . Mitral stenosis     Mild by echo 4/13  . Anemia     iron deficient  . Acute respiratory failure     12/2011 admission - decreased O2 sats on ambulation, improved by time of discharge  . CAD (coronary artery disease)     minimal plaque by cath 5/12    Past Surgical History  Procedure Date  . Total hip arthroplasty 1991, 1994    redo in 1994  . Lumbar disc surgery 1999  . Cardiac catheterization 01/10/2011    No significant CAD    History   Social History  . Marital Status: Widowed    Spouse Name: N/A    Number of Children: N/A  . Years of Education: N/A   Occupational History  .      retired   Social History Main Topics  . Smoking status: Former Smoker -- 2.0 packs/day for 25 years    Types: Cigarettes    Quit date: 08/20/1985  . Smokeless  tobacco: Never Used     Comment: Retired-widowed 89, lives with son  . Alcohol Use: No  . Drug Use: No  . Sexually Active: Not Currently   Other Topics Concern  . Not on file   Social History Narrative   Lives with sonWidowed 1991Quit smoking in 1987    Current Outpatient Prescriptions on File Prior to Visit  Medication Sig Dispense Refill  . acetaminophen (TYLENOL) 325 MG tablet Take 325 mg by mouth daily as needed. for pain.      . Ascorbic Acid (VITAMIN C) 500 MG tablet Take 500 mg by mouth daily.        Marland Kitchen aspirin EC 81 MG tablet Take 81 mg by mouth 2 (two) times daily.      Marland Kitchen b complex vitamins tablet Take 1 tablet by mouth daily.        . Cholecalciferol (VITAMIN D3) 2000 UNITS capsule Take 2,000 Units by mouth daily.        . cloNIDine (CATAPRES) 0.2 MG tablet Take 1 tablet (0.2 mg total) by mouth daily.  30 tablet  12  .  co-enzyme Q-10 50 MG capsule Take 50 mg by mouth daily.      . ferrous sulfate 325 (65 FE) MG tablet Take 1 tablet (325 mg total) by mouth 3 (three) times daily with meals.  90 tablet  1  . fish oil-omega-3 fatty acids 1000 MG capsule Take 2 g by mouth daily.        . furosemide (LASIX) 40 MG tablet TAKE 2 TABS IN THE MORNING AND TAKE 1 TAB IN AFTERNOON  90 tablet  11  . gabapentin (NEURONTIN) 300 MG capsule Take 2 capsules (600 mg total) by mouth at bedtime.  60 capsule  5  . insulin lispro (HUMALOG) 100 UNIT/ML injection For use in pump, total of 70 units per day      . metoprolol tartrate (LOPRESSOR) 25 MG tablet TAKE 1 TABLET IN THE AM AND TAKE 1 AND 1/2 TABLETS IN THE EVENING  225 tablet  1  . omeprazole (PRILOSEC) 40 MG capsule Take 40 mg by mouth daily before breakfast.       . pravastatin (PRAVACHOL) 20 MG tablet Take 1 tablet (20 mg total) by mouth every evening.  30 tablet  5  . Probiotic Product (PROBIOTIC PO) Take 1 tablet by mouth daily at 10 pm.      . traMADol (ULTRAM) 50 MG tablet Take 1 tablet (50 mg total) by mouth every 6 (six) hours as  needed. For pain  120 tablet  0  . valsartan (DIOVAN) 320 MG tablet Take 1 tablet (320 mg total) by mouth daily.  90 tablet  3    Allergies  Allergen Reactions  . Carvedilol Other (See Comments)    Heart stops  . Clindamycin/Lincomycin Other (See Comments)    tremors  . Diltiazem Hcl Other (See Comments)    Chest pain  . Fenofibrate Other (See Comments)    Unknown - [place] - MAR  . Hctz (Hydrochlorothiazide) Other (See Comments)    Chest pain  . Nisoldipine Other (See Comments)    Chest pain  . Nitroglycerin Other (See Comments)    Unknown - [place] MAR  . Propranolol Diarrhea    Chest pain  . Septra (Sulfamethoxazole-Tmp Ds)     Rash, cyst  . Tekturna (Aliskiren Fumarate) Other (See Comments)    Unknown reaction  . Valturna (Aliskiren-Valsartan) Other (See Comments)    Unknown reaction    Family History  Problem Relation Age of Onset  . Diabetes Father   . Diabetes Other     5/8 sibs   BP 134/60  Pulse 70  Temp 98.1 F (36.7 C) (Oral)  Resp 16  Wt 147 lb (66.679 kg)  SpO2 95%  Review of Systems denies hypoglycemia and fever    Objective:   Physical Exam VITAL SIGNS:  See vs page GENERAL: no distress Right temporal area: 4 cm flat, erythema, with a central scab.   Lab Results  Component Value Date   HGBA1C 6.3* 07/11/2012       Assessment & Plan:  DM is overcontrolled Scalp rash, possibly due to psoriasis

## 2012-07-12 LAB — MICROALBUMIN / CREATININE URINE RATIO: Microalb, Ur: 0.68 mg/dL (ref 0.00–1.89)

## 2012-07-14 ENCOUNTER — Telehealth: Payer: Self-pay | Admitting: *Deleted

## 2012-07-14 NOTE — Telephone Encounter (Signed)
PATIENT NOTIFIED OF LAB RESULTS AND ORDER OF REDUCING BASEL RATE TO 0.8 UNITS/HR. PATIENT STATES UNDERSTANDS INSTRUCTIONS.

## 2012-07-14 NOTE — Telephone Encounter (Signed)
Message copied by Elnora Morrison on Mon Jul 14, 2012  3:23 PM ------      Message from: Romero Belling      Created: Mon Jul 14, 2012  8:28 AM       please call patient:      Blood sugar is too low      Please reduce the basal rate to 0.8 units/hr.

## 2012-07-24 ENCOUNTER — Telehealth: Payer: Self-pay | Admitting: Cardiovascular Disease

## 2012-07-24 ENCOUNTER — Encounter: Payer: Self-pay | Admitting: Cardiovascular Disease

## 2012-07-24 ENCOUNTER — Ambulatory Visit (INDEPENDENT_AMBULATORY_CARE_PROVIDER_SITE_OTHER): Payer: Medicare Other | Admitting: Cardiovascular Disease

## 2012-07-24 VITALS — BP 122/68 | HR 70 | Wt 147.0 lb

## 2012-07-24 DIAGNOSIS — I739 Peripheral vascular disease, unspecified: Secondary | ICD-10-CM

## 2012-07-24 DIAGNOSIS — I2510 Atherosclerotic heart disease of native coronary artery without angina pectoris: Secondary | ICD-10-CM

## 2012-07-24 DIAGNOSIS — E1051 Type 1 diabetes mellitus with diabetic peripheral angiopathy without gangrene: Secondary | ICD-10-CM

## 2012-07-24 DIAGNOSIS — I872 Venous insufficiency (chronic) (peripheral): Secondary | ICD-10-CM

## 2012-07-24 DIAGNOSIS — E1059 Type 1 diabetes mellitus with other circulatory complications: Secondary | ICD-10-CM

## 2012-07-24 DIAGNOSIS — I359 Nonrheumatic aortic valve disorder, unspecified: Secondary | ICD-10-CM

## 2012-07-24 DIAGNOSIS — I350 Nonrheumatic aortic (valve) stenosis: Secondary | ICD-10-CM

## 2012-07-24 DIAGNOSIS — E7800 Pure hypercholesterolemia, unspecified: Secondary | ICD-10-CM

## 2012-07-24 NOTE — Assessment & Plan Note (Signed)
Cholesterol is at goal.  Continue current dose of statin and diet Rx.  No myalgias or side effects.  F/U  LFT's in 6 months. Lab Results  Component Value Date   LDLCALC 35 09/10/2011

## 2012-07-24 NOTE — Assessment & Plan Note (Signed)
STable with no ulcers continue current meds

## 2012-07-24 NOTE — Assessment & Plan Note (Signed)
She has been seen by Dr Kirke Corin before Iliac stents. STable claudication Will have her F/U with him for PV and carotid disease

## 2012-07-24 NOTE — Assessment & Plan Note (Signed)
F/U Dr Everardo All Insulin pump recently decreased dose A!C low for age

## 2012-07-24 NOTE — Telephone Encounter (Signed)
LEFT MESSAGE HAD  A 3:15 AND  4:00  PM OPENING./CY

## 2012-07-24 NOTE — Patient Instructions (Addendum)
Your physician recommends that you schedule a follow-up appointment in: SEE DR  ARIDA NEXT AVAILABLE  FOR PVD  Your physician wants you to follow-up in:   6 MONTHS WITH DR Haywood Filler will receive a reminder letter in the mail two months in advance. If you don't receive a letter, please call our office to schedule the follow-up appointment. Your physician recommends that you continue on your current medications as directed. Please refer to the Current Medication list given to you today.

## 2012-07-24 NOTE — Assessment & Plan Note (Signed)
NO change in murmur Not a surgical candidate Comorbidities and PVD would likely also preclude TAVR

## 2012-07-24 NOTE — Progress Notes (Signed)
Patient ID: Emma Yu, female   DOB: 1927-03-14, 78 y.o.   MRN: [id number] Previously followed by Dr. Lorine Bears  She has a history of mod aortic stenosis, Atrial fibrillation, diastolic heart failure, peripheral arterial disease, status post bilateral common iliac artery stenting in 2002 with known significant right SFA disease, diabetes, hypertension, hyperlipidemia. LHC 01/10/11: Luminal irregularities.  She was admitted 4/20-4/30. Prior to that, she had been seen at [facility] after suffering a fall resulting in laceration to her head. When she presented to [facility] she was noted to be anemic and dehydrated and in acute renal failure in the setting of diarrhea. She developed atrial fibrillation with rapid ventricular rate. She was rate controlled. She was not placed on anticoagulation secondary to significant fall risk. She also developed volume overload due to rehydration. This was treated with IV diuresis.   Carotid Dopplers 4/13: R ICA 60-79%; LICA 40-59%   Echocardiogram 12/10/11: Moderate LVH, EF 65-70%, moderate aortic stenosis, mean gradient 20, mild MS.   She was re-admitted 5/7-5/9 with a/c diastolic CHF. Chest x-ray confirmed CHF with associated bilateral effusions. She was diuresed. She was again felt to be a poor candidate for Coumadin secondary to fall risk. She was seen by internal medicine due to reported history of dark stools and anemia. She was Hemoccult negative. She was iron deficient. She has a history of right hemicolectomy in 2002. She was placed on iron. She was placed on Levaquin for her urinary tract infection. She was hypoxic and pulmonology evaluated her. This was not felt to be related to pneumonia but rather CHF and effusions, plus/minus COPD. Follow up chest x-ray was recommended. She was discharged to [facility].  I saw her 5/23 in followup. I adjusted her Lasix. A follow up chest x-ray was okay. She has been to the emergency room with diagnosis of  cellulitis since that time and prescribed antibiotics. She denies significant changes in her shortness of breath. She denies palpitations. She denies chest pain. She denies orthopnea, PND. Her lower extremity edema is stable. Her weight has increased 3-4 pounds over the last several days. However, she has missed a couple of doses of Lasix.  ROS: Denies fever, malais, weight loss, blurry vision, decreased visual acuity, cough, sputum, SOB, hemoptysis, pleuritic pain, palpitaitons, heartburn, abdominal pain, melena, lower extremity edema, claudication, or rash.  All other systems reviewed and negative  General: Affect appropriate Chronically ill female HEENT: normal Neck supple with no adenopathy JVP normal left bruits no thyromegaly Lungs clear with no wheezing and good diaphragmatic motion Heart:  S1/S2 AS murmur, no rub, gallop or click PMI normal Abdomen: benighn, BS positve, no tenderness, no AAA no bruit.  No HSM or HJR Distal pulses diminished with femoral bruits Plus one bilateral edema with brawny induration Neuro non-focal Skin warm and dry No muscular weakness   Current Outpatient Prescriptions  Medication Sig Dispense Refill  . acetaminophen (TYLENOL) 325 MG tablet Take 325 mg by mouth daily as needed. for pain.      . Ascorbic Acid (VITAMIN C) 500 MG tablet Take 500 mg by mouth daily.        Marland Kitchen aspirin EC 81 MG tablet Take 81 mg by mouth 2 (two) times daily.      Marland Kitchen b complex vitamins tablet Take 1 tablet by mouth daily.        . Cholecalciferol (VITAMIN D3) 2000 UNITS capsule Take 2,000 Units by mouth daily.        Marland Kitchen  cloNIDine (CATAPRES) 0.2 MG tablet Take 1 tablet (0.2 mg total) by mouth daily.  30 tablet  12  . co-enzyme Q-10 50 MG capsule Take 50 mg by mouth daily.      . ferrous sulfate 325 (65 FE) MG tablet Take 1 tablet (325 mg total) by mouth 3 (three) times daily with meals.  90 tablet  1  . fish oil-omega-3 fatty acids 1000 MG capsule Take 2 g by mouth daily.         . furosemide (LASIX) 40 MG tablet TAKE 2 TABS IN THE MORNING AND TAKE 1 TAB IN AFTERNOON  90 tablet  11  . gabapentin (NEURONTIN) 300 MG capsule Take 2 capsules (600 mg total) by mouth at bedtime.  60 capsule  5  . Insulin Infusion Pump Supplies (INSET INFUSION SET 23" ) MISC 1 Device by Does not apply route every 3 (three) days.      . Insulin Infusion Pump Supplies (INSULIN PUMP SYRINGE RESERVOIR) MISC 1 Device by Does not apply route every 3 (three) days. Animas 2 ml      . insulin lispro (HUMALOG) 100 UNIT/ML injection For use in pump, total of 70 units per day      . metoprolol tartrate (LOPRESSOR) 25 MG tablet TAKE 1 TABLET IN THE AM AND TAKE 1 AND 1/2 TABLETS IN THE EVENING  225 tablet  1  . omeprazole (PRILOSEC) 40 MG capsule Take 40 mg by mouth daily before breakfast.       . pravastatin (PRAVACHOL) 20 MG tablet Take 1 tablet (20 mg total) by mouth every evening.  30 tablet  5  . Probiotic Product (PROBIOTIC PO) Take 1 tablet by mouth daily at 10 pm.      . traMADol (ULTRAM) 50 MG tablet Take 1 tablet (50 mg total) by mouth every 6 (six) hours as needed. For pain  120 tablet  0  . triamcinolone cream (KENALOG) 0.1 % Apply topically 3 (three) times daily as needed. For rash  45 g  2  . valsartan (DIOVAN) 320 MG tablet Take 1 tablet (320 mg total) by mouth daily.  90 tablet  3    Allergies  Carvedilol; Clindamycin/lincomycin; Diltiazem hcl; Fenofibrate; Hctz; Nisoldipine; Nitroglycerin; Propranolol; Septra; Derl Barrow  Electrocardiogram:  Assessment and Plan

## 2012-07-24 NOTE — Telephone Encounter (Signed)
PT MAY COME IN EARLIER  TODAY FOR APPT .Emma Yu

## 2012-07-24 NOTE — Telephone Encounter (Signed)
New Problem:    Patient's son called in stating he was returning a call from you.  Patient stated that him and his mother would only be able to come in a few minutes earlier but not by much.  Please call back.

## 2012-07-24 NOTE — Assessment & Plan Note (Signed)
Stable with no angina and good activity level.  Continue medical Rx  

## 2012-08-06 ENCOUNTER — Encounter: Payer: Self-pay | Admitting: Internal Medicine

## 2012-08-06 ENCOUNTER — Other Ambulatory Visit (INDEPENDENT_AMBULATORY_CARE_PROVIDER_SITE_OTHER): Payer: Medicare Other

## 2012-08-06 ENCOUNTER — Ambulatory Visit (INDEPENDENT_AMBULATORY_CARE_PROVIDER_SITE_OTHER): Payer: Medicare Other | Admitting: Internal Medicine

## 2012-08-06 VITALS — BP 112/68 | HR 64 | Temp 97.3°F | Resp 16 | Wt 149.2 lb

## 2012-08-06 DIAGNOSIS — Z23 Encounter for immunization: Secondary | ICD-10-CM

## 2012-08-06 DIAGNOSIS — I10 Essential (primary) hypertension: Secondary | ICD-10-CM

## 2012-08-06 LAB — BASIC METABOLIC PANEL
BUN: 44 mg/dL — ABNORMAL HIGH (ref 6–23)
CO2: 32 mEq/L (ref 19–32)
GFR: 46.02 mL/min — ABNORMAL LOW (ref >60.00)
Glucose, Bld: 189 mg/dL — ABNORMAL HIGH (ref 70–99)
Potassium: 4.7 mEq/L (ref 3.5–5.1)
Sodium: 140 mEq/L (ref 135–145)

## 2012-08-06 NOTE — Progress Notes (Signed)
  Subjective:    Patient ID: Emma Yu, female    DOB: 04-20-1927, 78 y.o.   MRN: [id number]  Hypertension This is a chronic problem. The current episode started more than 1 year ago. The problem is unchanged. The problem is controlled. Pertinent negatives include no anxiety, blurred vision, chest pain, headaches, malaise/fatigue, neck pain, orthopnea, palpitations, peripheral edema, PND, shortness of breath or sweats. Past treatments include diuretics, angiotensin blockers, central alpha agonists and beta blockers. The current treatment provides moderate improvement. There are no compliance problems.  Hypertensive end-organ damage includes kidney disease and CAD/MI. Identifiable causes of hypertension include chronic renal disease.      Review of Systems  Constitutional: Negative for fever, chills, malaise/fatigue, diaphoresis, activity change, appetite change, fatigue and unexpected weight change.  HENT: Negative.  Negative for neck pain.   Eyes: Negative.  Negative for blurred vision.  Respiratory: Negative for choking, chest tightness, shortness of breath, wheezing and stridor.   Cardiovascular: Negative for chest pain, palpitations, orthopnea, leg swelling and PND.  Gastrointestinal: Negative.  Negative for nausea, vomiting, abdominal pain, diarrhea, constipation and blood in stool.  Genitourinary: Negative.   Musculoskeletal: Negative.   Skin: Negative for color change, pallor, rash and wound.  Neurological: Negative for dizziness, weakness and headaches.  Hematological: Negative for adenopathy. Does not bruise/bleed easily.  Psychiatric/Behavioral: Negative.        Objective:   Physical Exam  Vitals reviewed. Constitutional: [name] oriented to person, place, and time. She appears well-developed and well-nourished. No distress.  HENT:  Head: Normocephalic and atraumatic.  Mouth/Throat: Oropharynx is clear and moist. No oropharyngeal exudate.  Eyes: Conjunctivae normal  are normal. Right eye exhibits no discharge. Left eye exhibits no discharge. No scleral icterus.  Neck: Normal range of motion. Neck supple. No JVD present. No tracheal deviation present. No thyromegaly present.  Cardiovascular: Normal rate, regular rhythm, normal heart sounds and intact distal pulses.  Exam reveals no gallop and no friction rub.   No murmur heard. Pulmonary/Chest: Effort normal and breath sounds normal. No stridor. No respiratory distress. She has no wheezes. She has no rales. She exhibits no tenderness.  Abdominal: Soft. Bowel sounds are normal. She exhibits no distension and no mass. There is no tenderness. There is no rebound and no guarding.  Musculoskeletal: Normal range of motion. She exhibits no edema and no tenderness.  Lymphadenopathy:    She has no cervical adenopathy.  Neurological: [name] oriented to person, place, and time.  Skin: Skin is warm and dry. No rash noted. [name] not diaphoretic. No erythema. No pallor.  Psychiatric: She has a normal mood and affect. Her behavior is normal. Judgment and thought content normal.      Lab Results  Component Value Date   WBC 6.5 01/24/2012   HGB 12.7 01/24/2012   HCT 41.0 01/24/2012   PLT 186 01/24/2012   GLUCOSE 129* 03/07/2012   CHOL 109 09/10/2011   TRIG 121.0 09/10/2011   HDL 49.90 09/10/2011   LDLCALC 35 09/10/2011   ALT 9 12/10/2011   AST 14 12/10/2011   NA 141 03/07/2012   K 4.5 03/07/2012   CL 102 03/07/2012   CREATININE 1.32* 03/07/2012   BUN 41* 03/07/2012   CO2 34* 03/07/2012   TSH 0.511 12/10/2011   INR 1.18 12/25/2011   HGBA1C 6.3* 07/11/2012   MICROALBUR 0.68 07/11/2012      Assessment & Plan:

## 2012-08-06 NOTE — Patient Instructions (Signed)

## 2012-08-07 ENCOUNTER — Encounter: Payer: Self-pay | Admitting: Internal Medicine

## 2012-08-07 NOTE — Assessment & Plan Note (Signed)
Her BP is well controlled I will check her lytes and renal function 

## 2012-08-25 ENCOUNTER — Other Ambulatory Visit: Payer: Self-pay | Admitting: *Deleted

## 2012-08-25 MED ORDER — TRAMADOL HCL 50 MG PO TABS: 50.0000 mg | ORAL_TABLET | Freq: Four times a day (QID) | ORAL | Status: DC | PRN

## 2012-08-25 NOTE — Telephone Encounter (Signed)
Received fax pt wanting refill on her tramadol. Is this ok?Marland Kitchen..lmb

## 2012-09-03 ENCOUNTER — Encounter: Payer: Self-pay | Admitting: Cardiovascular Disease

## 2012-09-03 ENCOUNTER — Ambulatory Visit (INDEPENDENT_AMBULATORY_CARE_PROVIDER_SITE_OTHER): Payer: Medicare Other | Admitting: Cardiovascular Disease

## 2012-09-03 VITALS — BP 112/50 | HR 67 | Ht 64.0 in | Wt 135.0 lb

## 2012-09-03 DIAGNOSIS — I739 Peripheral vascular disease, unspecified: Secondary | ICD-10-CM

## 2012-09-03 DIAGNOSIS — I10 Essential (primary) hypertension: Secondary | ICD-10-CM

## 2012-09-03 MED ORDER — LOSARTAN POTASSIUM 100 MG PO TABS: 100.0000 mg | ORAL_TABLET | Freq: Every day | ORAL | Status: DC

## 2012-09-03 NOTE — Patient Instructions (Addendum)
Your physician has recommended you make the following change in your medication: STOP your Diovan, START Losartan 100 mg daily  Your physician has requested that you have a lower extremity arterial exercise duplex. During this test, exercise and ultrasound are used to evaluate arterial blood flow in the legs. Allow one hour for this exam. There are no restrictions or special instructions.  Your physician has requested that you have an aortailiac duplex. During this test, an ultrasound is used to evaluate the aorta. Allow 30 minutes for this exam. Do not eat after midnight the day before and avoid carbonated beverages

## 2012-09-03 NOTE — Assessment & Plan Note (Signed)
The patient informed me that she is taking both losartan and Diovan. Her insurance will not be covering Diovan anymore. I recommend stopping Diovan and continuing losartan 100 mg once daily.

## 2012-09-03 NOTE — Assessment & Plan Note (Signed)
Patient had previous bilateral iliac stenting done in 2002 with no recent noninvasive evaluation. [name] also known to have right SFA disease or occlusion. She has no palpable distal pulses with significant stasis dermatitis likely related to edema and venous insufficiency. I recommend aortoiliac duplex and lower extremity arterial Doppler. Given her multiple comorbidities and declining functional capacity, most likely I will treat her medically unless she has critical disease.

## 2012-09-03 NOTE — Progress Notes (Signed)
HPI  This is an 79 year old female who is here today for evaluation of peripheral arterial disease. She was referred by Dr. Eden Emms. She use to followup with me in [place] office. She has a history of mod aortic stenosis, Atrial fibrillation, diastolic heart failure, peripheral arterial disease, status post bilateral common iliac artery stenting in 2002 done by Dr. Karleen Hampshire with known significant right SFA disease, diabetes, hypertension, hyperlipidemia. LHC 01/10/11: Luminal irregularities.  She had multiple hospitalizations over the last year related to dehydration, anemia, atrial fibrillation and diastolic heart failure. She also had a revision of right hip surgery with very difficult healing. She walks very slowly with a walker. She denies significant claudication but really her mobility is very limited.  Allergies  Allergen Reactions  . Carvedilol Other (See Comments)    Heart stops  . Clindamycin/Lincomycin Other (See Comments)    tremors  . Diltiazem Hcl Other (See Comments)    Chest pain  . Fenofibrate Other (See Comments)    Unknown - [facility] - MAR  . Hctz (Hydrochlorothiazide) Other (See Comments)    Chest pain  . Nisoldipine Other (See Comments)    Chest pain  . Nitroglycerin Other (See Comments)    Unknown - [facility]  . Propranolol Diarrhea    Chest pain  . Septra (Sulfamethoxazole-Tmp Ds)     Rash, cyst  . Tekturna (Aliskiren Fumarate) Other (See Comments)    Unknown reaction  . Valturna (Aliskiren-Valsartan) Other (See Comments)    Unknown reaction     Current Outpatient Prescriptions on File Prior to Visit  Medication Sig Dispense Refill  . Ascorbic Acid (VITAMIN C) 500 MG tablet Take 500 mg by mouth daily.        Marland Kitchen aspirin EC 81 MG tablet Take 81 mg by mouth 2 (two) times daily.      Marland Kitchen b complex vitamins tablet Take 1 tablet by mouth daily.        . Cholecalciferol (VITAMIN D3) 2000 UNITS capsule Take 2,000 Units by mouth daily.        . cloNIDine (CATAPRES) 0.2  MG tablet Take 1 tablet (0.2 mg total) by mouth daily.  30 tablet  12  . co-enzyme Q-10 50 MG capsule Take 50 mg by mouth daily.      . ferrous sulfate 325 (65 FE) MG tablet Take 1 tablet (325 mg total) by mouth 3 (three) times daily with meals.  90 tablet  1  . fish oil-omega-3 fatty acids 1000 MG capsule Take 2 g by mouth daily.        . furosemide (LASIX) 40 MG tablet TAKE 2 TABS IN THE MORNING AND TAKE 1 TAB IN AFTERNOON  90 tablet  11  . gabapentin (NEURONTIN) 300 MG capsule Take 2 capsules (600 mg total) by mouth at bedtime.  60 capsule  5  . Insulin Infusion Pump Supplies (INSET INFUSION SET 23" ) MISC 1 Device by Does not apply route every 3 (three) days.      . Insulin Infusion Pump Supplies (INSULIN PUMP SYRINGE RESERVOIR) MISC 1 Device by Does not apply route every 3 (three) days. Animas 2 ml      . insulin lispro (HUMALOG) 100 UNIT/ML injection Inject 55.2 Units into the skin. For use in pump, total of 70 units per day      . metoprolol tartrate (LOPRESSOR) 25 MG tablet TAKE 1 TABLET IN THE AM AND TAKE 1 AND 1/2 TABLETS IN THE EVENING  225 tablet  1  . omeprazole (PRILOSEC) 40 MG capsule Take 40 mg by mouth daily before breakfast.       . pravastatin (PRAVACHOL) 20 MG tablet Take 1 tablet (20 mg total) by mouth every evening.  30 tablet  5  . Probiotic Product (PROBIOTIC PO) Take 1 tablet by mouth daily at 10 pm.      . traMADol (ULTRAM) 50 MG tablet Take 1 tablet (50 mg total) by mouth every 6 (six) hours as needed. For pain  120 tablet  0  . triamcinolone cream (KENALOG) 0.1 % Apply topically 3 (three) times daily as needed. For rash  45 g  2     Past Medical History  Diagnosis Date  . PAD (peripheral artery disease)     s/p bilateral comon iliac artery stenting in 2002. Known significant  R SFA  disease. carotid dz noted 11/2011 followed by Dr. Imogene Burn  . Diabetes mellitus   . Hypertension   . Hyperlipidemia   . Venous insufficiency   . Chronic diastolic heart failure      Echocardiogram 12/10/11: Moderate LVH, EF 65-70%, moderate aortic stenosis, mean gradient 20, mild MS  . Atrial fibrillation     not a coumadin candidate secondary to fall risk  . DJD (degenerative joint disease) of hip     s/p R THR 10/2010  . Aortic stenosis     Moderate by echo 4/13 - mean 20 mmHg  . Mitral stenosis     Mild by echo 4/13  . Anemia     iron deficient  . Acute respiratory failure     12/2011 admission - decreased O2 sats on ambulation, improved by time of discharge  . CAD (coronary artery disease)     minimal plaque by cath 5/12     Past Surgical History  Procedure Date  . Total hip arthroplasty 1991, 1994    redo in 1994  . Lumbar disc surgery 1999  . Cardiac catheterization 01/10/2011    No significant CAD     Family History  Problem Relation Age of Onset  . Diabetes Father   . Diabetes Other     5/8 sibs     History   Social History  . Marital Status: Widowed    Spouse Name: N/A    Number of Children: N/A  . Years of Education: N/A   Occupational History  .      retired   Social History Main Topics  . Smoking status: Former Smoker -- 2.0 packs/day for 25 years    Types: Cigarettes    Quit date: 08/20/1985  . Smokeless tobacco: Never Used     Comment: Retired-widowed 199, lives with son  . Alcohol Use: No  . Drug Use: No  . Sexually Active: Not Currently   Other Topics Concern  . Not on file   Social History Narrative   Lives with sonWidowed 1991Quit smoking in 1987     PHYSICAL EXAM   BP 112/50  Pulse 67  Ht 5\' 4"  (1.626 m)  Wt 135 lb (61.236 kg)  BMI 23.17 kg/m2 Constitutional: [name] oriented to person, place, and time. She appears well-developed and well-nourished. No distress.  HENT: No nasal discharge.  Head: Normocephalic and atraumatic.  Eyes: Pupils are equal and round. Right eye exhibits no discharge. Left eye exhibits no discharge.  Neck: Normal range of motion. Neck supple. No JVD present. No thyromegaly  present.  Cardiovascular: Normal rate, regular rhythm, normal heart sounds. Exam reveals no  gallop and no friction rub. 2/6 systolic ejection murmur at the aortic area.  Pulmonary/Chest: Effort normal and breath sounds normal. No stridor. No respiratory distress. She has no wheezes. She has no rales. She exhibits no tenderness.  Abdominal: Soft. Bowel sounds are normal. She exhibits no distension. There is no tenderness. There is no rebound and no guarding.  Musculoskeletal: Normal range of motion. She exhibits trace edema and no tenderness.  Neurological: [name] alert and oriented to person, place, and time. Coordination normal.  Skin: Skin is warm and dry. Chronic stasis dermatitis with hyperpigmentation. [name] not diaphoretic.  Psychiatric: She has a normal mood and affect. Her behavior is normal. Judgment and thought content normal.     ASSESSMENT AND PLAN

## 2012-09-09 ENCOUNTER — Encounter (INDEPENDENT_AMBULATORY_CARE_PROVIDER_SITE_OTHER): Payer: Medicare Other

## 2012-09-09 DIAGNOSIS — I739 Peripheral vascular disease, unspecified: Secondary | ICD-10-CM

## 2012-09-18 ENCOUNTER — Encounter (INDEPENDENT_AMBULATORY_CARE_PROVIDER_SITE_OTHER): Payer: Medicare Other

## 2012-09-18 DIAGNOSIS — I739 Peripheral vascular disease, unspecified: Secondary | ICD-10-CM

## 2012-09-18 DIAGNOSIS — I700 Atherosclerosis of aorta: Secondary | ICD-10-CM

## 2012-10-13 ENCOUNTER — Ambulatory Visit (INDEPENDENT_AMBULATORY_CARE_PROVIDER_SITE_OTHER): Payer: Medicare Other | Admitting: Endocrinology

## 2012-10-13 ENCOUNTER — Encounter: Payer: Self-pay | Admitting: Endocrinology

## 2012-10-13 VITALS — BP 124/76 | HR 76 | Wt 151.0 lb

## 2012-10-13 DIAGNOSIS — I739 Peripheral vascular disease, unspecified: Secondary | ICD-10-CM

## 2012-10-13 DIAGNOSIS — E1051 Type 1 diabetes mellitus with diabetic peripheral angiopathy without gangrene: Secondary | ICD-10-CM

## 2012-10-13 DIAGNOSIS — E1059 Type 1 diabetes mellitus with other circulatory complications: Secondary | ICD-10-CM

## 2012-10-13 NOTE — Progress Notes (Signed)
Subjective:    Patient ID: Emma Yu, female    DOB: Apr 09, 1927, 79 y.o.   MRN: [id number]  HPI Pt returns for f/u of type 1 dm (dx'ed 1999; complicated by CAD and PAD; she has a one-touch ping insulin pump; she has declined continuous glucose monitor; she has never had severe hypoglycemia).  she brings a record of her cbg's which i have reviewed today.  It varies from 93-200, but most are in the 100's.  There is no trend throughout the day.  She averages a total of approx 60 units of humalog per day, via her pump.   Past Medical History  Diagnosis Date  . PAD (peripheral artery disease)     s/p bilateral comon iliac artery stenting in 2002. Known significant  R SFA  disease. carotid dz noted 11/2011 followed by Dr. Imogene Burn  . Diabetes mellitus   . Hypertension   . Hyperlipidemia   . Venous insufficiency   . Chronic diastolic heart failure     Echocardiogram 12/10/11: Moderate LVH, EF 65-70%, moderate aortic stenosis, mean gradient 20, mild MS  . Atrial fibrillation     not a coumadin candidate secondary to fall risk  . DJD (degenerative joint disease) of hip     s/p R THR 10/2010  . Aortic stenosis     Moderate by echo 4/13 - mean 20 mmHg  . Mitral stenosis     Mild by echo 4/13  . Anemia     iron deficient  . Acute respiratory failure     12/2011 admission - decreased O2 sats on ambulation, improved by time of discharge  . CAD (coronary artery disease)     minimal plaque by cath 5/12    Past Surgical History  Procedure Laterality Date  . Total hip arthroplasty  1991, 1994    redo in 1994  . Lumbar disc surgery  1999  . Cardiac catheterization  01/10/2011    No significant CAD    History   Social History  . Marital Status: Widowed    Spouse Name: N/A    Number of Children: N/A  . Years of Education: N/A   Occupational History  .      retired   Social History Main Topics  . Smoking status: Former Smoker -- 2.00 packs/day for 25 years    Types: Cigarettes   Quit date: 08/20/1985  . Smokeless tobacco: Never Used     Comment: Retired-widowed 89, lives with son  . Alcohol Use: No  . Drug Use: No  . Sexually Active: Not Currently   Other Topics Concern  . Not on file   Social History Narrative   Lives with son   Widowed 89   Quit smoking in 1987    Current Outpatient Prescriptions on File Prior to Visit  Medication Sig Dispense Refill  . Ascorbic Acid (VITAMIN C) 500 MG tablet Take 500 mg by mouth daily.        Marland Kitchen aspirin EC 81 MG tablet Take 81 mg by mouth 2 (two) times daily.      Marland Kitchen b complex vitamins tablet Take 1 tablet by mouth daily.        . Cholecalciferol (VITAMIN D3) 2000 UNITS capsule Take 2,000 Units by mouth daily.        . cloNIDine (CATAPRES) 0.2 MG tablet Take 1 tablet (0.2 mg total) by mouth daily.  30 tablet  12  . co-enzyme Q-10 50 MG capsule Take 50 mg by mouth daily.      Marland Kitchen  ferrous sulfate 325 (65 FE) MG tablet Take 1 tablet (325 mg total) by mouth 3 (three) times daily with meals.  90 tablet  1  . fish oil-omega-3 fatty acids 1000 MG capsule Take 2 g by mouth daily.        . furosemide (LASIX) 40 MG tablet TAKE 2 TABS IN THE MORNING AND TAKE 1 TAB IN AFTERNOON  90 tablet  11  . gabapentin (NEURONTIN) 300 MG capsule Take 2 capsules (600 mg total) by mouth at bedtime.  60 capsule  5  . Insulin Infusion Pump Supplies (INSET INFUSION SET 23" ) MISC 1 Device by Does not apply route every 3 (three) days.      . Insulin Infusion Pump Supplies (INSULIN PUMP SYRINGE RESERVOIR) MISC 1 Device by Does not apply route every 3 (three) days. Animas 2 ml      . insulin lispro (HUMALOG) 100 UNIT/ML injection Inject 55.2 Units into the skin. For use in pump, total of 70 units per day      . losartan (COZAAR) 100 MG tablet Take 1 tablet (100 mg total) by mouth daily.  30 tablet  6  . metoprolol tartrate (LOPRESSOR) 25 MG tablet TAKE 1 TABLET IN THE AM AND TAKE 1 AND 1/2 TABLETS IN THE EVENING  225 tablet  1  . omeprazole (PRILOSEC)  40 MG capsule Take 40 mg by mouth daily before breakfast.       . pravastatin (PRAVACHOL) 20 MG tablet Take 1 tablet (20 mg total) by mouth every evening.  30 tablet  5  . Probiotic Product (PROBIOTIC PO) Take 1 tablet by mouth daily at 10 pm.      . traMADol (ULTRAM) 50 MG tablet Take 1 tablet (50 mg total) by mouth every 6 (six) hours as needed. For pain  120 tablet  0  . triamcinolone cream (KENALOG) 0.1 % Apply topically 3 (three) times daily as needed. For rash  45 g  2   No current facility-administered medications on file prior to visit.    Allergies  Allergen Reactions  . Carvedilol Other (See Comments)    Heart stops  . Clindamycin/Lincomycin Other (See Comments)    tremors  . Diltiazem Hcl Other (See Comments)    Chest pain  . Fenofibrate Other (See Comments)    Unknown - [facility] - MAR  . Hctz (Hydrochlorothiazide) Other (See Comments)    Chest pain  . Nisoldipine Other (See Comments)    Chest pain  . Nitroglycerin Other (See Comments)    Unknown - [facility] MAR  . Propranolol Diarrhea    Chest pain  . Septra (Sulfamethoxazole-Tmp Ds)     Rash, cyst  . Tekturna (Aliskiren Fumarate) Other (See Comments)    Unknown reaction  . Valturna (Aliskiren-Valsartan) Other (See Comments)    Unknown reaction    Family History  Problem Relation Age of Onset  . Diabetes Father   . Diabetes Other     5/8 sibs    BP 124/76  Pulse 76  Wt 151 lb (68.493 kg)  BMI 25.91 kg/m2  SpO2 94%  Review of Systems denies hypoglycemia    Objective:   Physical Exam Pulses: dorsalis pedis are absent bilaterally.  Feet: no deformity. no ulcer on the feet. feet are of normal color and temp. no edema. There is mild erythema of the legs. There are bilat varicosities. There is bilateral onychomycosis.   Neuro: sensation is intact to touch on the feet.     Assessment &  Plan:  Type 1 DM, apparently well-controlled

## 2012-10-13 NOTE — Patient Instructions (Addendum)
blood tests are being requested for you today.  We'll contact you with results.  take a basal rate to 0.8 unit/hr, 24 hrs per day.   Take a bolus of 12 units with each meal.   continue correction bolus (which some people call "sensitivity," or "insulin sensitivity ratio," or just "isr") of 1 unit for each by 25 which your glucose exceeds 120. check your blood sugar 4 times a day--before the 3 meals, and at bedtime.  also check if you have symptoms of your blood sugar being too high or too low.  please keep a record of the readings and bring it to your next appointment here.  please call us sooner if you are having low blood sugar episodes. Please make a follow-up appointment in 3 months.   Please let me know if you want to consider a continuous glucose monitor.

## 2012-10-16 ENCOUNTER — Telehealth: Payer: Self-pay

## 2012-10-16 DIAGNOSIS — M1990 Unspecified osteoarthritis, unspecified site: Secondary | ICD-10-CM

## 2012-10-16 MED ORDER — TRAMADOL HCL 50 MG PO TABS: 50.0000 mg | ORAL_TABLET | Freq: Four times a day (QID) | ORAL | Status: DC | PRN

## 2012-10-16 NOTE — Telephone Encounter (Signed)
done

## 2012-10-16 NOTE — Telephone Encounter (Signed)
Please advise if ok to refill tramadol 50 mg #120 for this pt. Medication was last filled 08/25/12 #120

## 2012-11-04 ENCOUNTER — Other Ambulatory Visit: Payer: Self-pay

## 2012-11-04 MED ORDER — GABAPENTIN 300 MG PO CAPS: 600.0000 mg | ORAL_CAPSULE | Freq: Every day | ORAL | Status: DC

## 2012-11-25 ENCOUNTER — Encounter: Payer: Self-pay | Admitting: Cardiovascular Disease

## 2012-11-25 ENCOUNTER — Ambulatory Visit (INDEPENDENT_AMBULATORY_CARE_PROVIDER_SITE_OTHER): Payer: Medicare Other | Admitting: Cardiovascular Disease

## 2012-11-25 VITALS — BP 124/78 | HR 76 | Ht 65.0 in | Wt 149.8 lb

## 2012-11-25 DIAGNOSIS — L0291 Cutaneous abscess, unspecified: Secondary | ICD-10-CM

## 2012-11-25 DIAGNOSIS — I359 Nonrheumatic aortic valve disorder, unspecified: Secondary | ICD-10-CM

## 2012-11-25 DIAGNOSIS — I350 Nonrheumatic aortic (valve) stenosis: Secondary | ICD-10-CM

## 2012-11-25 DIAGNOSIS — L0390 Cellulitis, unspecified: Secondary | ICD-10-CM

## 2012-11-25 DIAGNOSIS — I739 Peripheral vascular disease, unspecified: Secondary | ICD-10-CM

## 2012-11-25 MED ORDER — AMOXICILLIN-POT CLAVULANATE 875-125 MG PO TABS: 1.0000 | ORAL_TABLET | Freq: Two times a day (BID) | ORAL | Status: DC

## 2012-11-25 NOTE — Assessment & Plan Note (Signed)
Encouraged her to take her lasix as ordered  Elevate at end of day

## 2012-11-25 NOTE — Progress Notes (Signed)
Patient ID: Emma Yu, female   DOB: 14-Feb-1927, 79 y.o.   MRN: [id number] Previously followed by Dr. Lorine Bears She has a history of mod aortic stenosis, Atrial fibrillation, diastolic heart failure, peripheral arterial disease, status post bilateral common iliac artery stenting in 2002 with known significant right SFA disease, diabetes, hypertension, hyperlipidemia. LHC 01/10/11: Luminal irregularities.  She was admitted 4/20-4/30. Prior to that, she had been seen at [facility] after suffering a fall resulting in laceration to her head. When she presented to [facility] she was noted to be anemic and dehydrated and in acute renal failure in the setting of diarrhea. She developed atrial fibrillation with rapid ventricular rate. She was rate controlled. She was not placed on anticoagulation secondary to significant fall risk. She also developed volume overload due to rehydration. This was treated with IV diuresis.  Carotid Dopplers 4/13: R ICA 60-79%; LICA 40-59%   Echocardiogram 12/10/11: Moderate LVH, EF 65-70%, moderate aortic stenosis, mean gradient 20, mild MS.   She was re-admitted 5/7-5/9 with a/c diastolic CHF. Chest x-ray confirmed CHF with associated bilateral effusions. She was diuresed. She was again felt to be a poor candidate for Coumadin secondary to fall risk. She was seen by internal medicine due to reported history of dark stools and anemia. She was Hemoccult negative. She was iron deficient. She has a history of right hemicolectomy in 2002. She was placed on iron. She was placed on Levaquin for her urinary tract infection. She was hypoxic and pulmonology evaluated her. This was not felt to be related to pneumonia but rather CHF and effusions, plus/minus COPD. Follow up chest x-ray was recommended. She was discharged to [facility].  I saw her 5/23 in followup. I adjusted her Lasix. A follow up chest x-ray was okay. She has been to the emergency room with diagnosis of  cellulitis since that time and prescribed antibiotics. She denies significant changes in her shortness of breath. She denies palpitations. She denies chest pain. She denies orthopnea, PND. Her lower extremity edema is stable. Her weight has increased 3-4 pounds over the last several days. However, she has missed a couple of doses of Lasix.  Seen by Dr Kirke Corin in January .  Stents patent by duplex.  Last two days has had erythema and warmth in RLE.  No fevers or chills  ROS: Denies fever, malais, weight loss, blurry vision, decreased visual acuity, cough, sputum, SOB, hemoptysis, pleuritic pain, palpitaitons, heartburn, abdominal pain, melena, lower extremity edema, claudication, or rash.  All other systems reviewed and negative  General: Affect appropriate [place] elderly female HEENT: normal Neck supple with no adenopathy JVP normal bilateral  bruits no thyromegaly Lungs clear with no wheezing and good diaphragmatic motion Heart:  S1/S2 AS  murmur, no rub, gallop or click PMI normal Abdomen: benighn, BS positve, no tenderness, no AAA no bruit.  No HSM or HJR No pulses in feet Plus one bilaterlal  Edema with brawny chronic changes Neuro non-focal  Essential tremor LUE Skin ? Erysipelas with sharply demarcated erythema RLE below knee No muscular weakness   Current Outpatient Prescriptions  Medication Sig Dispense Refill  . Ascorbic Acid (VITAMIN C) 500 MG tablet Take 500 mg by mouth daily.        Marland Kitchen aspirin EC 81 MG tablet Take 81 mg by mouth 2 (two) times daily.      Marland Kitchen b complex vitamins tablet Take 1 tablet by mouth daily.        . Cholecalciferol (VITAMIN  D3) 2000 UNITS capsule Take 2,000 Units by mouth daily.        . cloNIDine (CATAPRES) 0.2 MG tablet Take 1 tablet (0.2 mg total) by mouth daily.  30 tablet  12  . co-enzyme Q-10 50 MG capsule Take 50 mg by mouth daily.      . ferrous sulfate 325 (65 FE) MG tablet Take 1 tablet (325 mg total) by mouth 3 (three) times daily with meals.   90 tablet  1  . fish oil-omega-3 fatty acids 1000 MG capsule Take 2 g by mouth daily.        . furosemide (LASIX) 40 MG tablet Take 40 mg by mouth daily.      Marland Kitchen gabapentin (NEURONTIN) 300 MG capsule Take 2 capsules (600 mg total) by mouth at bedtime.  60 capsule  5  . Insulin Infusion Pump Supplies (INSET INFUSION SET 23" ) MISC 1 Device by Does not apply route every 3 (three) days.      . Insulin Infusion Pump Supplies (INSULIN PUMP SYRINGE RESERVOIR) MISC 1 Device by Does not apply route every 3 (three) days. Animas 2 ml      . insulin lispro (HUMALOG) 100 UNIT/ML injection Inject 55.2 Units into the skin. For use in pump, total of 70 units per day      . losartan (COZAAR) 100 MG tablet Take 1 tablet (100 mg total) by mouth daily.  30 tablet  6  . metoprolol tartrate (LOPRESSOR) 25 MG tablet Take 25 mg by mouth daily.      Marland Kitchen omeprazole (PRILOSEC) 40 MG capsule Take 40 mg by mouth daily before breakfast.       . pravastatin (PRAVACHOL) 20 MG tablet Take 1 tablet (20 mg total) by mouth every evening.  30 tablet  5  . Probiotic Product (PROBIOTIC PO) Take 1 tablet by mouth daily at 10 pm.      . traMADol (ULTRAM) 50 MG tablet Take 1 tablet (50 mg total) by mouth every 6 (six) hours as needed for pain. For pain  120 tablet  11  . triamcinolone cream (KENALOG) 0.1 % Apply topically 3 (three) times daily as needed. For rash  45 g  2   No current facility-administered medications for this visit.    Allergies  Carvedilol; Clindamycin/lincomycin; Diltiazem hcl; Fenofibrate; Hctz; Nisoldipine; Nitroglycerin; Propranolol; Septra; Derl Barrow  Electrocardiogram:  Assessment and Plan

## 2012-11-25 NOTE — Assessment & Plan Note (Signed)
Cholesterol is at goal.  Continue current dose of statin and diet Rx.  No myalgias or side effects.  F/U  LFT's in 6 months. Lab Results  Component Value Date   LDLCALC 35 09/10/2011

## 2012-11-25 NOTE — Assessment & Plan Note (Signed)
F/U DR Kirke Corin  Iliac stents patent but has distal disease and SFA disease.  Concern for cellulitis with poor healing

## 2012-11-25 NOTE — Assessment & Plan Note (Signed)
Moderate by history and not a candidate for surgery and probably not for TAVR either especially with PVD

## 2012-11-25 NOTE — Patient Instructions (Addendum)
Your physician recommends that you schedule a follow-up appointment in: WITH DR ARIDA ON 12-09-12 PER DR NISHAN    6 MONTHS WITH DR [facility]  Your physician has recommended you make the following change in your medication: START  AUGMENTIN  875/125  1 TAB DAILY FOR 10 DAYS   Your physician has requested that you have a carotid duplex. This test is an ultrasound of the carotid arteries in your neck. It looks at blood flow through these arteries that supply the brain with blood. Allow one hour for this exam. There are no restrictions or special instructions.  CHANGE APPT TO  THE SAME DAY AS 12-09-12 APPT

## 2012-11-25 NOTE — Assessment & Plan Note (Signed)
Stable with no angina and good activity level.  Continue medical Rx  

## 2012-11-25 NOTE — Assessment & Plan Note (Signed)
Start Augmentin bid for 10 days given possibility of strep and erysipelas Son known to take her to ER for fever rigors or chills.  F/U with Dr Yetta Barre and Kirke Corin

## 2012-11-27 ENCOUNTER — Ambulatory Visit (INDEPENDENT_AMBULATORY_CARE_PROVIDER_SITE_OTHER): Payer: Medicare Other | Admitting: Internal Medicine

## 2012-11-27 ENCOUNTER — Encounter: Payer: Self-pay | Admitting: Internal Medicine

## 2012-11-27 VITALS — BP 120/64 | HR 59 | Temp 97.7°F | Resp 16 | Wt 147.0 lb

## 2012-11-27 DIAGNOSIS — I872 Venous insufficiency (chronic) (peripheral): Secondary | ICD-10-CM

## 2012-11-27 DIAGNOSIS — I10 Essential (primary) hypertension: Secondary | ICD-10-CM

## 2012-11-27 MED ORDER — CLONIDINE HCL 0.2 MG PO TABS: 0.2000 mg | ORAL_TABLET | Freq: Two times a day (BID) | ORAL | Status: DC

## 2012-11-27 MED ORDER — VASCULERA PO TABS: 1.0000 | ORAL_TABLET | Freq: Every day | ORAL | Status: DC

## 2012-11-27 NOTE — Patient Instructions (Signed)
Venous Stasis and Chronic Venous Insufficiency As people age, the veins located in their legs may weaken and stretch. When veins weaken and lose the ability to pump blood effectively, the condition is called chronic venous insufficiency (CVI) or venous stasis. Almost all veins return blood back to the heart. This happens by:  The force of the heart pumping fresh blood pushes blood back to the heart.  Blood flowing to the heart from the force of gravity. In the deep veins of the legs, blood has to fight gravity and flow upstream back to the heart. Here, the leg muscles contract to pump blood back toward the heart. Vein walls are elastic, and many veins have small valves that only allow blood to flow in one direction. When leg muscles contract, they push inward against the elastic vein walls. This squeezes blood upward, opens the valves, and moves blood toward the heart. When leg muscles relax, the vein wall also relaxes and the valves inside the vein close to prevent blood from flowing backward. This method of pumping blood out of the legs is called the venous pump. CAUSES  The venous pump works best while walking and leg muscles are contracting. But when a person sits or stands, blood pressure in leg veins can build. Deep veins are usually able to withstand short periods of inactivity, but long periods of inactivity (and increased pressure) can stretch, weaken, and damage vein walls. High blood pressure can also stretch and damage vein walls. The veins may no longer be able to pump blood back to the heart. Venous hypertension (high blood pressure inside veins) that lasts over time is a primary cause of CVI. CVI can also be caused by:   Deep vein thrombosis, a condition where a thrombus (blood clot) blocks blood flow in a vein.  Phlebitis, an inflammation of a superficial vein that causes a blood clot to form. Other risk factors for CVI may include:   Heredity.  Obesity.  Pregnancy.  Sedentary  lifestyle.  Smoking.  Jobs requiring long periods of standing or sitting in one place.  Age and gender:  Women in their 40's and 50's and men in their 70's are more prone to developing CVI. SYMPTOMS  Symptoms of CVI may include:   Varicose veins.  Ulceration or skin breakdown.  Lipodermatosclerosis, a condition that affects the skin just above the ankle, usually on the inside surface. Over time the skin becomes brown, smooth, tight and often painful. Those with this condition have a high risk of developing skin ulcers.  Reddened or discolored skin on the leg.  Swelling. DIAGNOSIS  Your caregiver can diagnose CVI after performing a careful medical history and physical examination. To confirm the diagnosis, the following tests may also be ordered:   Duplex ultrasound.  Plethysmography (tests blood flow).  Venograms (x-ray using a special dye). TREATMENT The goals of treatment for CVI are to restore a person to an active life and to minimize pain or disability. Typically, CVI does not pose a serious threat to life or limb, and with proper treatment most people with this condition can continue to lead active lives. In most cases, mild CVI can be treated on an outpatient basis with simple procedures. Treatment methods include:   Elastic compression socks.  Sclerotherapy, a procedure involving an injection of a material that "dissolves" the damaged veins. Other veins in the network of blood vessels take over the function of the damaged veins.  Vein stripping (an older procedure less commonly used).    Laser Ablation surgery.  Valve repair. HOME CARE INSTRUCTIONS   Elastic compression socks must be worn every day. They can help with symptoms and lower the chances of the problem getting worse, but they do not cure the problem.  Only take over-the-counter or prescription medicines for pain, discomfort, or fever as directed by your caregiver.  Your caregiver will review your other  medications with you. SEEK MEDICAL CARE IF:   You are confused about how to take your medications.  There is redness, swelling, or increasing pain in the affected area.  There is a red streak or line that extends up or down from the affected area.  There is a breakdown or loss of skin in the affected area, even if the breakdown is small.  You develop an unexplained oral temperature above 102 F (38.9 C).  There is an injury to the affected area. SEEK IMMEDIATE MEDICAL CARE IF:   There is an injury and open wound to the affected area.  Pain is not adequately relieved with pain medication prescribed or becomes severe.  An oral temperature above 102 F (38.9 C) develops.  The foot/ankle below the affected area becomes suddenly numb or the area feels weak and hard to move. MAKE SURE YOU:   Understand these instructions.  Will watch your condition.  Will get help right away if you are not doing well or get worse. Document Released: 12/10/2006 Document Revised: 10/29/2011 Document Reviewed: 02/17/2007 [employer] Patient Information 2013 [employer].  

## 2012-11-27 NOTE — Progress Notes (Signed)
Subjective:    Patient ID: Emma Yu, female    DOB: 1927-01-04, 79 y.o.   MRN: [id number]  Hypertension This is a chronic problem. The current episode started more than 1 year ago. The problem is unchanged. The problem is controlled. Associated symptoms include peripheral edema. Pertinent negatives include no anxiety, blurred vision, chest pain, headaches, malaise/fatigue, neck pain, orthopnea, palpitations, PND, shortness of breath or sweats. Past treatments include angiotensin blockers, central alpha agonists and diuretics. The current treatment provides significant improvement. There are no compliance problems.  Hypertensive end-organ damage includes CAD/MI and PVD.      Review of Systems  Constitutional: Negative.  Negative for fever, chills, malaise/fatigue, diaphoresis, activity change, appetite change, fatigue and unexpected weight change.  HENT: Negative.  Negative for neck pain.   Eyes: Negative.  Negative for blurred vision.  Respiratory: Negative.  Negative for cough, chest tightness, shortness of breath, wheezing and stridor.   Cardiovascular: Positive for leg swelling. Negative for chest pain, palpitations, orthopnea and PND.       She has had swelling and itching in her RLE for several weeks and was started on amoxil earlier this week for cellulitis but is not any better.  Gastrointestinal: Negative.  Negative for nausea, vomiting, abdominal pain, diarrhea, constipation and blood in stool.  Endocrine: Negative.   Genitourinary: Negative.   Musculoskeletal: Negative.  Negative for myalgias, back pain, joint swelling, arthralgias and gait problem.  Skin: Negative.  Negative for color change, pallor and wound.  Allergic/Immunologic: Negative.   Neurological: Negative.  Negative for dizziness and headaches.  Hematological: Negative.  Negative for adenopathy. Does not bruise/bleed easily.  Psychiatric/Behavioral: Negative.        Objective:   Physical Exam    Constitutional: [name] oriented to person, place, and time. She appears well-developed and well-nourished. No distress.  HENT:  Head: Normocephalic and atraumatic.  Mouth/Throat: Oropharynx is clear and moist. No oropharyngeal exudate.  Eyes: Conjunctivae are normal. Right eye exhibits no discharge. Left eye exhibits no discharge. No scleral icterus.  Neck: Normal range of motion. Neck supple. No JVD present. No tracheal deviation present. No thyromegaly present.  Cardiovascular: Normal rate and regular rhythm.  Exam reveals no gallop, no S3 and no S4.   Murmur heard.  Decrescendo systolic murmur is present with a grade of 1/6   No diastolic murmur is present  Pulses:      Carotid pulses are 1+ on the right side, and 1+ on the left side.      Radial pulses are 1+ on the right side, and 1+ on the left side.       Femoral pulses are 1+ on the right side, and 1+ on the left side.      Popliteal pulses are 0 on the right side, and 0 on the left side.       Dorsalis pedis pulses are 0 on the right side, and 0 on the left side.       Posterior tibial pulses are 0 on the right side, and 0 on the left side.  Pulmonary/Chest: Effort normal and breath sounds normal. No stridor. No respiratory distress. She has no wheezes. She has no rales. She exhibits no tenderness.  Abdominal: Soft. Bowel sounds are normal. She exhibits no distension and no mass. There is no tenderness. There is no rebound and no guarding.  Musculoskeletal: Normal range of motion. She exhibits edema (trace pitting edema in BLE). She exhibits no tenderness.  Lymphadenopathy:  She has no cervical adenopathy.  Neurological: [name] oriented to person, place, and time.  Skin: Skin is warm, dry and intact. No abrasion, no bruising, no burn, no ecchymosis, no laceration, no lesion, no petechiae and no rash noted. [name] not diaphoretic. There is erythema. No pallor.     Psychiatric: She has a normal mood and affect. Her behavior is  normal. Judgment and thought content normal.     Lab Results  Component Value Date   WBC 6.5 01/24/2012   HGB 12.7 01/24/2012   HCT 41.0 01/24/2012   PLT 186 01/24/2012   GLUCOSE 189* 08/06/2012   CHOL 109 09/10/2011   TRIG 121.0 09/10/2011   HDL 49.90 09/10/2011   LDLCALC 35 09/10/2011   ALT 9 12/10/2011   AST 14 12/10/2011   NA 140 08/06/2012   K 4.7 08/06/2012   CL 104 08/06/2012   CREATININE 1.2 08/06/2012   BUN 44* 08/06/2012   CO2 32 08/06/2012   TSH 0.511 12/10/2011   INR 1.18 12/25/2011   HGBA1C 6.1 10/13/2012   MICROALBUR 0.68 07/11/2012       Assessment & Plan:

## 2012-11-27 NOTE — Assessment & Plan Note (Signed)
I do not think that this is cellulitis so I have advised her to stop the augmentin This appears to be an acute flare of venous insufficiency so I have asked her to start vasculera

## 2012-11-27 NOTE — Assessment & Plan Note (Signed)
She tells me that she saw Dr. Eden Emms earlier this week and she reported BP spikes and was told to increase the clonidine to BID, which she has done for the last 3 days and BP has improved, she asks that I update the Rx for this at her pharmacy.

## 2012-12-04 ENCOUNTER — Telehealth: Payer: Self-pay

## 2012-12-04 NOTE — Telephone Encounter (Signed)
I think she should stay on vasculera I am not concerned about a heart rate of 59 and I do not think vasculera is causing that

## 2012-12-04 NOTE — Telephone Encounter (Signed)
Pt called stating that Emma Yu is causing her heart rate to drop. Pt states that she started medication yesterday and her pulse was at 59 (same as last OV). Pt says that [facility] advised her that her pulse should range between 60-100. Pt is requesting MD advisement on medication, she has not taken it yet today, pulse currently 76.

## 2012-12-04 NOTE — Telephone Encounter (Signed)
Pt advised and agrees to continue medication as prescribed

## 2012-12-08 ENCOUNTER — Telehealth: Payer: Self-pay | Admitting: Cardiovascular Disease

## 2012-12-08 NOTE — Telephone Encounter (Deleted)
error 

## 2012-12-09 ENCOUNTER — Ambulatory Visit (INDEPENDENT_AMBULATORY_CARE_PROVIDER_SITE_OTHER): Payer: Medicare Other | Admitting: Cardiovascular Disease

## 2012-12-09 ENCOUNTER — Encounter: Payer: Self-pay | Admitting: Cardiovascular Disease

## 2012-12-09 ENCOUNTER — Encounter (INDEPENDENT_AMBULATORY_CARE_PROVIDER_SITE_OTHER): Payer: Medicare Other

## 2012-12-09 VITALS — BP 138/62 | HR 62 | Ht 64.0 in | Wt 145.0 lb

## 2012-12-09 DIAGNOSIS — I739 Peripheral vascular disease, unspecified: Secondary | ICD-10-CM

## 2012-12-09 DIAGNOSIS — I2510 Atherosclerotic heart disease of native coronary artery without angina pectoris: Secondary | ICD-10-CM

## 2012-12-09 DIAGNOSIS — I6529 Occlusion and stenosis of unspecified carotid artery: Secondary | ICD-10-CM

## 2012-12-09 DIAGNOSIS — I25810 Atherosclerosis of coronary artery bypass graft(s) without angina pectoris: Secondary | ICD-10-CM

## 2012-12-09 DIAGNOSIS — I872 Venous insufficiency (chronic) (peripheral): Secondary | ICD-10-CM

## 2012-12-09 DIAGNOSIS — I4891 Unspecified atrial fibrillation: Secondary | ICD-10-CM

## 2012-12-09 NOTE — Assessment & Plan Note (Signed)
No evidence of cellulitis. This is a chronic problem which worsened recently with inflammatory response. I advised leg elevation during the day and support stocking when there is edema. I agree with Sadie Haber which seems to be helping.  I don't think this is related to her PAD which has been stable.

## 2012-12-09 NOTE — Assessment & Plan Note (Signed)
This seems to be stable.  Recent noninvasive evaluation showed patent iliac stents and improved ABI from before. Continue medical therapy.  Follow up in 1 year.

## 2012-12-09 NOTE — Progress Notes (Signed)
HPI  This is an 79 year old female who is here today for a follow up visit regarding peripheral arterial disease.  She has a history of mod aortic stenosis, Atrial fibrillation, diastolic heart failure, peripheral arterial disease, status post bilateral common iliac artery stenting in 2002 done by Dr. Karleen Hampshire with known significant right SFA disease, diabetes, hypertension, hyperlipidemia. LHC 01/10/11: Luminal irregularities.  She had multiple hospitalizations over the last year related to dehydration, anemia, atrial fibrillation and diastolic heart failure. She also had a revision of right hip surgery with very difficult healing. She walks very slowly with a walker. [name] deemed to be not an anticoagulation candidate.  She had worsening stasis dermatitis recently. [name] known to have chronic venous insufficiency. She was given Augmentin by Dr. Eden Emms. She was seen be PCP (Dr. Yetta Barre) who add Vasculara. She reports improvement since then.   Allergies  Allergen Reactions  . Carvedilol Other (See Comments)    Heart stops  . Clindamycin/Lincomycin Other (See Comments)    tremors  . Diltiazem Hcl Other (See Comments)    Chest pain  . Fenofibrate Other (See Comments)    Unknown - [place] - MAR  . Hctz (Hydrochlorothiazide) Other (See Comments)    Chest pain  . Nisoldipine Other (See Comments)    Chest pain  . Nitroglycerin Other (See Comments)    Unknown - [place] MAR  . Propranolol Diarrhea    Chest pain  . Septra (Sulfamethoxazole-Tmp Ds)     Rash, cyst  . Tekturna (Aliskiren Fumarate) Other (See Comments)    Unknown reaction  . Valturna (Aliskiren-Valsartan) Other (See Comments)    Unknown reaction     Current Outpatient Prescriptions on File Prior to Visit  Medication Sig Dispense Refill  . Ascorbic Acid (VITAMIN C) 500 MG tablet Take 500 mg by mouth daily.        Marland Kitchen aspirin EC 81 MG tablet Take 81 mg by mouth 2 (two) times daily.      Marland Kitchen b complex vitamins tablet Take 1 tablet by  mouth daily.        . Cholecalciferol (VITAMIN D3) 2000 UNITS capsule Take 2,000 Units by mouth daily.        . cloNIDine (CATAPRES) 0.2 MG tablet Take 1 tablet (0.2 mg total) by mouth 2 (two) times daily.  60 tablet  5  . co-enzyme Q-10 50 MG capsule Take 50 mg by mouth daily.      . Dietary Management Product (VASCULERA) TABS Take 1 capsule by mouth daily.  30 tablet  11  . DIOVAN 320 MG tablet Take o.ne tab at bedtime      . ferrous sulfate 325 (65 FE) MG tablet Take 1 tablet (325 mg total) by mouth 3 (three) times daily with meals.  90 tablet  1  . fish oil-omega-3 fatty acids 1000 MG capsule Take 2 g by mouth daily.        . furosemide (LASIX) 40 MG tablet Take 40 mg by mouth daily.      Marland Kitchen gabapentin (NEURONTIN) 300 MG capsule Take 2 capsules (600 mg total) by mouth at bedtime.  60 capsule  5  . Insulin Infusion Pump Supplies (INSET INFUSION SET 23" ) MISC 1 Device by Does not apply route every 3 (three) days.      . Insulin Infusion Pump Supplies (INSULIN PUMP SYRINGE RESERVOIR) MISC 1 Device by Does not apply route every 3 (three) days. Animas 2 ml      .  insulin lispro (HUMALOG) 100 UNIT/ML injection Inject 55.2 Units into the skin. For use in pump, total of 70 units per day      . losartan (COZAAR) 100 MG tablet Take 1 tablet (100 mg total) by mouth daily.  30 tablet  6  . metoprolol tartrate (LOPRESSOR) 25 MG tablet Take 25 mg by mouth 2 (two) times daily.       . Probiotic Product (PROBIOTIC PO) Take 1 tablet by mouth daily at 10 pm.      . traMADol (ULTRAM) 50 MG tablet Take 1 tablet (50 mg total) by mouth every 6 (six) hours as needed for pain. For pain  120 tablet  11  . omeprazole (PRILOSEC) 40 MG capsule Take 40 mg by mouth daily before breakfast.       . pravastatin (PRAVACHOL) 20 MG tablet Take 1 tablet (20 mg total) by mouth every evening.  30 tablet  5   No current facility-administered medications on file prior to visit.     Past Medical History  Diagnosis Date  .  PAD (peripheral artery disease)     s/p bilateral comon iliac artery stenting in 2002. Known significant  R SFA  disease. carotid dz noted 11/2011 followed by Dr. Imogene Burn  . Diabetes mellitus   . Hypertension   . Hyperlipidemia   . Venous insufficiency   . Chronic diastolic heart failure     Echocardiogram 12/10/11: Moderate LVH, EF 65-70%, moderate aortic stenosis, mean gradient 20, mild MS  . Atrial fibrillation     not a coumadin candidate secondary to fall risk  . DJD (degenerative joint disease) of hip     s/p R THR 10/2010  . Aortic stenosis     Moderate by echo 4/13 - mean 20 mmHg  . Mitral stenosis     Mild by echo 4/13  . Anemia     iron deficient  . Acute respiratory failure     12/2011 admission - decreased O2 sats on ambulation, improved by time of discharge  . CAD (coronary artery disease)     minimal plaque by cath 5/12     Past Surgical History  Procedure Laterality Date  . Total hip arthroplasty  1991, 1994    redo in 1994  . Lumbar disc surgery  1999  . Cardiac catheterization  01/10/2011    No significant CAD     Family History  Problem Relation Age of Onset  . Diabetes Father   . Diabetes Other     5/8 sibs     History   Social History  . Marital Status: Widowed    Spouse Name: N/A    Number of Children: N/A  . Years of Education: N/A   Occupational History  .      retired   Social History Main Topics  . Smoking status: Former Smoker -- 2.00 packs/day for 25 years    Types: Cigarettes    Quit date: 08/20/1985  . Smokeless tobacco: Never Used     Comment: Retired-widowed 89, lives with son  . Alcohol Use: No  . Drug Use: No  . Sexually Active: Not Currently   Other Topics Concern  . Not on file   Social History Narrative   Lives with son   Widowed 37   Quit smoking in 47     PHYSICAL EXAM   BP 138/62  Pulse 62  Ht 5\' 4"  (1.626 m)  Wt 145 lb (65.772 kg)  BMI 24.88 kg/m2 Constitutional:  [name] oriented to person, place,  and time. She appears well-developed and well-nourished. No distress.  HENT: No nasal discharge.  Head: Normocephalic and atraumatic.  Eyes: Pupils are equal and round. Right eye exhibits no discharge. Left eye exhibits no discharge.  Neck: Normal range of motion. Neck supple. No JVD present. No thyromegaly present.  Cardiovascular: Normal rate, irregular rhythm, normal heart sounds. Exam reveals no gallop and no friction rub. 2/6 systolic ejection murmur at the aortic area.  Pulmonary/Chest: Effort normal and breath sounds normal. No stridor. No respiratory distress. She has no wheezes. She has no rales. She exhibits no tenderness.  Abdominal: Soft. Bowel sounds are normal. She exhibits no distension. There is no tenderness. There is no rebound and no guarding.  Musculoskeletal: Normal range of motion. She exhibits trace edema and no tenderness.  Neurological: [name] alert and oriented to person, place, and time. Coordination normal.  Skin: Skin is warm and dry. Chronic stasis dermatitis with hyperpigmentation. [name] not diaphoretic.  Psychiatric: She has a normal mood and affect. Her behavior is normal. Judgment and thought content normal.   EKG: A-fib, LAD, septal infarct  ASSESSMENT AND PLAN

## 2012-12-09 NOTE — Patient Instructions (Addendum)
Your physician wants you to follow-up in: 1 year with Dr. Arida. You will receive a reminder letter in the mail two months in advance. If you don't receive a letter, please call our office to schedule the follow-up appointment.  Your physician recommends that you continue on your current medications as directed. Please refer to the Current Medication list given to you today.  

## 2013-01-09 ENCOUNTER — Ambulatory Visit (INDEPENDENT_AMBULATORY_CARE_PROVIDER_SITE_OTHER): Payer: Medicare Other | Admitting: Endocrinology

## 2013-01-09 ENCOUNTER — Encounter: Payer: Self-pay | Admitting: Endocrinology

## 2013-01-09 VITALS — BP 122/74 | HR 78 | Ht 64.0 in | Wt 145.0 lb

## 2013-01-09 DIAGNOSIS — E1051 Type 1 diabetes mellitus with diabetic peripheral angiopathy without gangrene: Secondary | ICD-10-CM

## 2013-01-09 DIAGNOSIS — I739 Peripheral vascular disease, unspecified: Secondary | ICD-10-CM

## 2013-01-09 DIAGNOSIS — E1059 Type 1 diabetes mellitus with other circulatory complications: Secondary | ICD-10-CM

## 2013-01-09 NOTE — Patient Instructions (Addendum)
A diabetes blood test is requested for you today.  We'll contact you with results.  take a basal rate to 0.8 unit/hr, 24 hrs per day.   Take a bolus of 11 units with each meal.   continue correction bolus (which some people call "sensitivity," or "insulin sensitivity ratio," or just "isr") of 1 unit for each by 25 which your glucose exceeds 120. check your blood sugar 4 times a day--before the 3 meals, and at bedtime.  also check if you have symptoms of your blood sugar being too high or too low.  please keep a record of the readings and bring it to your next appointment here.  please call us sooner if you are having low blood sugar episodes. Please make a follow-up appointment in 3 months.

## 2013-01-09 NOTE — Progress Notes (Signed)
Subjective:    Patient ID: Emma Yu, female    DOB: [birth year], 79 y.o.   MRN: [id number]  HPI Pt returns for f/u of type 1 dm (dx'ed 1999; she has mild if any neuropathy of the lower extremities; she has associated by CAD and PAD; she has a one-touch ping insulin pump; she has declined continuous glucose monitor; she has never had severe hypoglycemia or DKA). she brings a record of her cbg's which i have reviewed today.  It varies from 81-200, but most are in the 100's.  There is no trend throughout the day.  She averages a total of approx 60 units of humalog per day, via her pump.   Past Medical History  Diagnosis Date  . PAD (peripheral artery disease)     s/p bilateral comon iliac artery stenting in 2002. Known significant  R SFA  disease. carotid dz noted 11/2011 followed by Dr. Imogene Burn  . Diabetes mellitus   . Hypertension   . Hyperlipidemia   . Venous insufficiency   . Chronic diastolic heart failure     Echocardiogram 12/10/11: Moderate LVH, EF 65-70%, moderate aortic stenosis, mean gradient 20, mild MS  . Atrial fibrillation     not a coumadin candidate secondary to fall risk  . DJD (degenerative joint disease) of hip     s/p R THR 10/2010  . Aortic stenosis     Moderate by echo 4/13 - mean 20 mmHg  . Mitral stenosis     Mild by echo 4/13  . Anemia     iron deficient  . Acute respiratory failure     12/2011 admission - decreased O2 sats on ambulation, improved by time of discharge  . CAD (coronary artery disease)     minimal plaque by cath 5/12    Past Surgical History  Procedure Laterality Date  . Total hip arthroplasty  1991, 1994    redo in 1994  . Lumbar disc surgery  1999  . Cardiac catheterization  01/10/2011    No significant CAD    History   Social History  . Marital Status: Widowed    Spouse Name: N/A    Number of Children: N/A  . Years of Education: N/A   Occupational History  .      retired   Social History Main Topics  . Smoking status:  Former Smoker -- 2.00 packs/day for 25 years    Types: Cigarettes    Quit date: 08/20/1985  . Smokeless tobacco: Never Used     Comment: Retired-widowed 89, lives with son  . Alcohol Use: No  . Drug Use: No  . Sexually Active: Not Currently   Other Topics Concern  . Not on file   Social History Narrative   Lives with son   Widowed 39   Quit smoking in 1987    Current Outpatient Prescriptions on File Prior to Visit  Medication Sig Dispense Refill  . Ascorbic Acid (VITAMIN C) 500 MG tablet Take 500 mg by mouth daily.        Marland Kitchen aspirin EC 81 MG tablet Take 81 mg by mouth 2 (two) times daily.      Marland Kitchen b complex vitamins tablet Take 1 tablet by mouth daily.        . Cholecalciferol (VITAMIN D3) 2000 UNITS capsule Take 2,000 Units by mouth daily.        . cloNIDine (CATAPRES) 0.2 MG tablet Take 1 tablet (0.2 mg total) by mouth 2 (two) times daily.  60 tablet  5  . co-enzyme Q-10 50 MG capsule Take 50 mg by mouth daily.      . Dietary Management Product (VASCULERA) TABS Take 1 capsule by mouth daily.  30 tablet  11  . DIOVAN 320 MG tablet Take o.ne tab at bedtime      . fish oil-omega-3 fatty acids 1000 MG capsule Take 2 g by mouth daily.        . furosemide (LASIX) 40 MG tablet Take 40 mg by mouth daily.      Marland Kitchen gabapentin (NEURONTIN) 300 MG capsule Take 2 capsules (600 mg total) by mouth at bedtime.  60 capsule  5  . Insulin Infusion Pump Supplies (INSET INFUSION SET 23" ) MISC 1 Device by Does not apply route every 3 (three) days.      . Insulin Infusion Pump Supplies (INSULIN PUMP SYRINGE RESERVOIR) MISC 1 Device by Does not apply route every 3 (three) days. Animas 2 ml      . insulin lispro (HUMALOG) 100 UNIT/ML injection Inject 55.2 Units into the skin. For use in pump, total of 70 units per day      . losartan (COZAAR) 100 MG tablet Take 1 tablet (100 mg total) by mouth daily.  30 tablet  6  . metoprolol tartrate (LOPRESSOR) 25 MG tablet Take 25 mg by mouth 2 (two) times daily.        Marland Kitchen omeprazole (PRILOSEC) 40 MG capsule Take 40 mg by mouth daily before breakfast.       . pravastatin (PRAVACHOL) 20 MG tablet Take 1 tablet (20 mg total) by mouth every evening.  30 tablet  5  . Probiotic Product (PROBIOTIC PO) Take 1 tablet by mouth daily at 10 pm.      . traMADol (ULTRAM) 50 MG tablet Take 1 tablet (50 mg total) by mouth every 6 (six) hours as needed for pain. For pain  120 tablet  11  . ferrous sulfate 325 (65 FE) MG tablet Take 1 tablet (325 mg total) by mouth 3 (three) times daily with meals.  90 tablet  1   No current facility-administered medications on file prior to visit.    Allergies  Allergen Reactions  . Carvedilol Other (See Comments)    Heart stops  . Clindamycin/Lincomycin Other (See Comments)    tremors  . Diltiazem Hcl Other (See Comments)    Chest pain  . Fenofibrate Other (See Comments)    Unknown - [facility] - MAR  . Hctz (Hydrochlorothiazide) Other (See Comments)    Chest pain  . Nisoldipine Other (See Comments)    Chest pain  . Nitroglycerin Other (See Comments)    Unknown - [facility]  . Propranolol Diarrhea    Chest pain  . Septra (Sulfamethoxazole-Tmp Ds)     Rash, cyst  . Tekturna (Aliskiren Fumarate) Other (See Comments)    Unknown reaction  . Valturna (Aliskiren-Valsartan) Other (See Comments)    Unknown reaction    Family History  Problem Relation Age of Onset  . Diabetes Father   . Diabetes Other     5/8 sibs    BP 122/74  Pulse 78  Ht 5\' 4"  (1.626 m)  Wt 145 lb (65.772 kg)  BMI 24.88 kg/m2  SpO2 98%   Review of Systems denies hypoglycemia and weight change.      Objective:   Physical Exam VITAL SIGNS:  See vs page GENERAL: no distress     Assessment & Plan:  DM: apparently well-controlled.  This insulin pump regimen was chosen from multiple options, as it best matches her insulin to her changing requirements throughout the day.

## 2013-01-13 ENCOUNTER — Other Ambulatory Visit: Payer: Self-pay | Admitting: Internal Medicine

## 2013-01-29 ENCOUNTER — Other Ambulatory Visit: Payer: Medicare Other

## 2013-01-29 ENCOUNTER — Encounter: Payer: Self-pay | Admitting: Internal Medicine

## 2013-01-29 ENCOUNTER — Ambulatory Visit (INDEPENDENT_AMBULATORY_CARE_PROVIDER_SITE_OTHER): Payer: Medicare Other | Admitting: Internal Medicine

## 2013-01-29 VITALS — BP 120/70 | HR 72 | Temp 97.5°F | Resp 16 | Ht 64.0 in | Wt 145.8 lb

## 2013-01-29 DIAGNOSIS — B354 Tinea corporis: Secondary | ICD-10-CM

## 2013-01-29 DIAGNOSIS — L03319 Cellulitis of trunk, unspecified: Secondary | ICD-10-CM

## 2013-01-29 DIAGNOSIS — L02219 Cutaneous abscess of trunk, unspecified: Secondary | ICD-10-CM

## 2013-01-29 MED ORDER — KETOCONAZOLE 2 % EX CREA: TOPICAL_CREAM | Freq: Two times a day (BID) | CUTANEOUS | Status: DC

## 2013-01-29 NOTE — Assessment & Plan Note (Signed)
I and D was done Culture was sent [name] on doxy

## 2013-01-29 NOTE — Patient Instructions (Signed)

## 2013-01-29 NOTE — Assessment & Plan Note (Signed)
I have asked her to treat this with ketoconazole cream

## 2013-01-29 NOTE — Progress Notes (Signed)
Subjective:    Patient ID: Emma Yu, female    DOB: [birth year], 79 y.o.   MRN: [id number]  HPI Comments: She has had a rash and a painful nodule under her left breast for 2 weeks. She was seen at an [facility] and was treated but and I and D was not done.  Wound Check She was originally treated more than 14 days ago. Previous treatment included oral antibiotics (Doxycycline and nystatin powder). Her temperature was unmeasured prior to arrival. The temperature was taken using an axillary reading. There has been no drainage from the wound. The redness has worsened. The swelling has worsened. The pain has worsened. She has no difficulty moving the affected extremity or digit.      Review of Systems  Constitutional: Negative.  Negative for fever, chills, diaphoresis and fatigue.  HENT: Negative.   Eyes: Negative.   Respiratory: Negative.  Negative for cough, chest tightness, shortness of breath, wheezing and stridor.   Cardiovascular: Negative.  Negative for chest pain, palpitations and leg swelling.  Gastrointestinal: Negative.  Negative for nausea, vomiting, abdominal pain, diarrhea and constipation.  Endocrine: Negative.   Genitourinary: Negative.   Musculoskeletal: Negative.   Skin: Positive for rash and wound. Negative for color change and pallor.  Allergic/Immunologic: Negative.   Neurological: Negative.  Negative for dizziness and weakness.  Hematological: Negative.  Negative for adenopathy. Does not bruise/bleed easily.  Psychiatric/Behavioral: Negative.        Objective:   Physical Exam  Vitals reviewed. Constitutional: [name] oriented to person, place, and time. She appears well-developed and well-nourished.  Non-toxic appearance. She does not have a sickly appearance. She does not appear ill. No distress.  HENT:  Head: Normocephalic and atraumatic.  Mouth/Throat: Oropharynx is clear and moist. No oropharyngeal exudate.  Eyes: Conjunctivae are normal. Right eye exhibits  no discharge. Left eye exhibits no discharge. No scleral icterus.  Neck: Normal range of motion. Neck supple. No JVD present. No tracheal deviation present. No thyromegaly present.  Cardiovascular: Normal rate, regular rhythm, normal heart sounds and intact distal pulses.   Pulmonary/Chest: Effort normal and breath sounds normal. No stridor. No respiratory distress. She has no wheezes. She has no rales. She exhibits no tenderness.  Abdominal: Soft. Bowel sounds are normal. She exhibits no distension and no mass. There is no tenderness. There is no rebound and no guarding.  Musculoskeletal: Normal range of motion. She exhibits no edema.  Lymphadenopathy:    She has no cervical adenopathy.  Neurological: [name] oriented to person, place, and time.  Skin: Skin is warm and dry. Rash noted. No purpura noted. Rash is papular. Rash is not macular, not maculopapular, not nodular, not pustular, not vesicular and not urticarial. [name] not diaphoretic. No erythema. No pallor.     The area was prepped and draped in sterile fashion and local anesthesia was obtained with 2% lido with epi, 2 cc were used. A 3 mm punch incision was made and the contents of a sebaceous cyst was removed, there was no exudate. The cavity was irrigated with H202 and was packed with iodoform.  A dressing was applied. She tolerated this well.  Psychiatric: She has a normal mood and affect. Her behavior is normal. Judgment and thought content normal.     Lab Results  Component Value Date   WBC 6.5 01/24/2012   HGB 12.7 01/24/2012   HCT 41.0 01/24/2012   PLT 186 01/24/2012   GLUCOSE 189* 08/06/2012   CHOL 109 09/10/2011  TRIG 121.0 09/10/2011   HDL 49.90 09/10/2011   LDLCALC 35 09/10/2011   ALT 9 12/10/2011   AST 14 12/10/2011   NA 140 08/06/2012   K 4.7 08/06/2012   CL 104 08/06/2012   CREATININE 1.2 08/06/2012   BUN 44* 08/06/2012   CO2 32 08/06/2012   TSH 0.511 12/10/2011   INR 1.18 12/25/2011   HGBA1C 6.9* 01/09/2013   MICROALBUR  0.68 07/11/2012       Assessment & Plan:

## 2013-01-30 ENCOUNTER — Ambulatory Visit (INDEPENDENT_AMBULATORY_CARE_PROVIDER_SITE_OTHER): Payer: Medicare Other | Admitting: Internal Medicine

## 2013-01-30 ENCOUNTER — Encounter: Payer: Self-pay | Admitting: Internal Medicine

## 2013-01-30 VITALS — BP 102/60 | HR 61 | Temp 97.6°F | Resp 16 | Ht 64.0 in | Wt 145.0 lb

## 2013-01-30 DIAGNOSIS — L02219 Cutaneous abscess of trunk, unspecified: Secondary | ICD-10-CM

## 2013-01-30 DIAGNOSIS — L03319 Cellulitis of trunk, unspecified: Secondary | ICD-10-CM

## 2013-01-30 MED ORDER — ZOSTER VACCINE LIVE 19400 UNT/0.65ML ~~LOC~~ SOLR: 0.6500 mL | Freq: Once | SUBCUTANEOUS | Status: DC

## 2013-01-30 NOTE — Patient Instructions (Signed)
Wound Care Wound care helps prevent pain and infection.  You may need a tetanus shot if:  You cannot remember when you had your last tetanus shot.  You have never had a tetanus shot.  The injury broke your skin. If you need a tetanus shot and you choose not to have one, you may get tetanus. Sickness from tetanus can be serious. HOME CARE   Only take medicine as told by your doctor.  Clean the wound daily with mild soap and water.  Change any bandages (dressings) as told by your doctor.  Put medicated cream and a bandage on the wound as told by your doctor.  Change the bandage if it gets wet, dirty, or starts to smell.  Take showers. Do not take baths, swim, or do anything that puts your wound under water.  Rest and raise (elevate) the wound until the pain and puffiness (swelling) are better.  Keep all doctor visits as told. GET HELP RIGHT AWAY IF:   Yellowish-white fluid (pus) comes from the wound.  Medicine does not lessen your pain.  There is a red streak going away from the wound.  You have a fever. MAKE SURE YOU:   Understand these instructions.  Will watch your condition.  Will get help right away if you are not doing well or get worse. Document Released: 05/15/2008 Document Revised: 10/29/2011 Document Reviewed: 12/10/2010 [employer] Patient Information 2014 [employer].  

## 2013-01-30 NOTE — Assessment & Plan Note (Signed)
Improvement noted Clx is not back yet but at this point I am not concerned about infection Wound care was discussed and she was given pt ed material

## 2013-01-30 NOTE — Progress Notes (Signed)
  Subjective:    Patient ID: Emma Yu, female    DOB: 1927-08-17, 79 y.o.   MRN: [id number]  Wound Check She was originally treated yesterday. Previous treatment included I&D of abscess and oral antibiotics. Her temperature was unmeasured prior to arrival. There has been no drainage from the wound. There is no redness present. There is no swelling present. The pain has no pain. She has no difficulty moving the affected extremity or digit.      Review of Systems  All other systems reviewed and are negative.       Objective:   Physical Exam  Skin:             Assessment & Plan:

## 2013-02-01 LAB — WOUND CULTURE
Gram Stain: NONE SEEN
Gram Stain: NONE SEEN

## 2013-02-05 ENCOUNTER — Ambulatory Visit: Payer: Medicare Other | Admitting: Internal Medicine

## 2013-02-10 IMAGING — CR DG CHEST 1V PORT
1 series · 1 of 1 positions shown · non-contrast
Comparison: Portable exam 1599 hours compared to 12/12/2011

CLINICAL DATA: Shortness of breath, fever, heart attack in 7597,
hypertension, diabetes, history CHF

PORTABLE CHEST - 1 VIEW

[view not recorded]
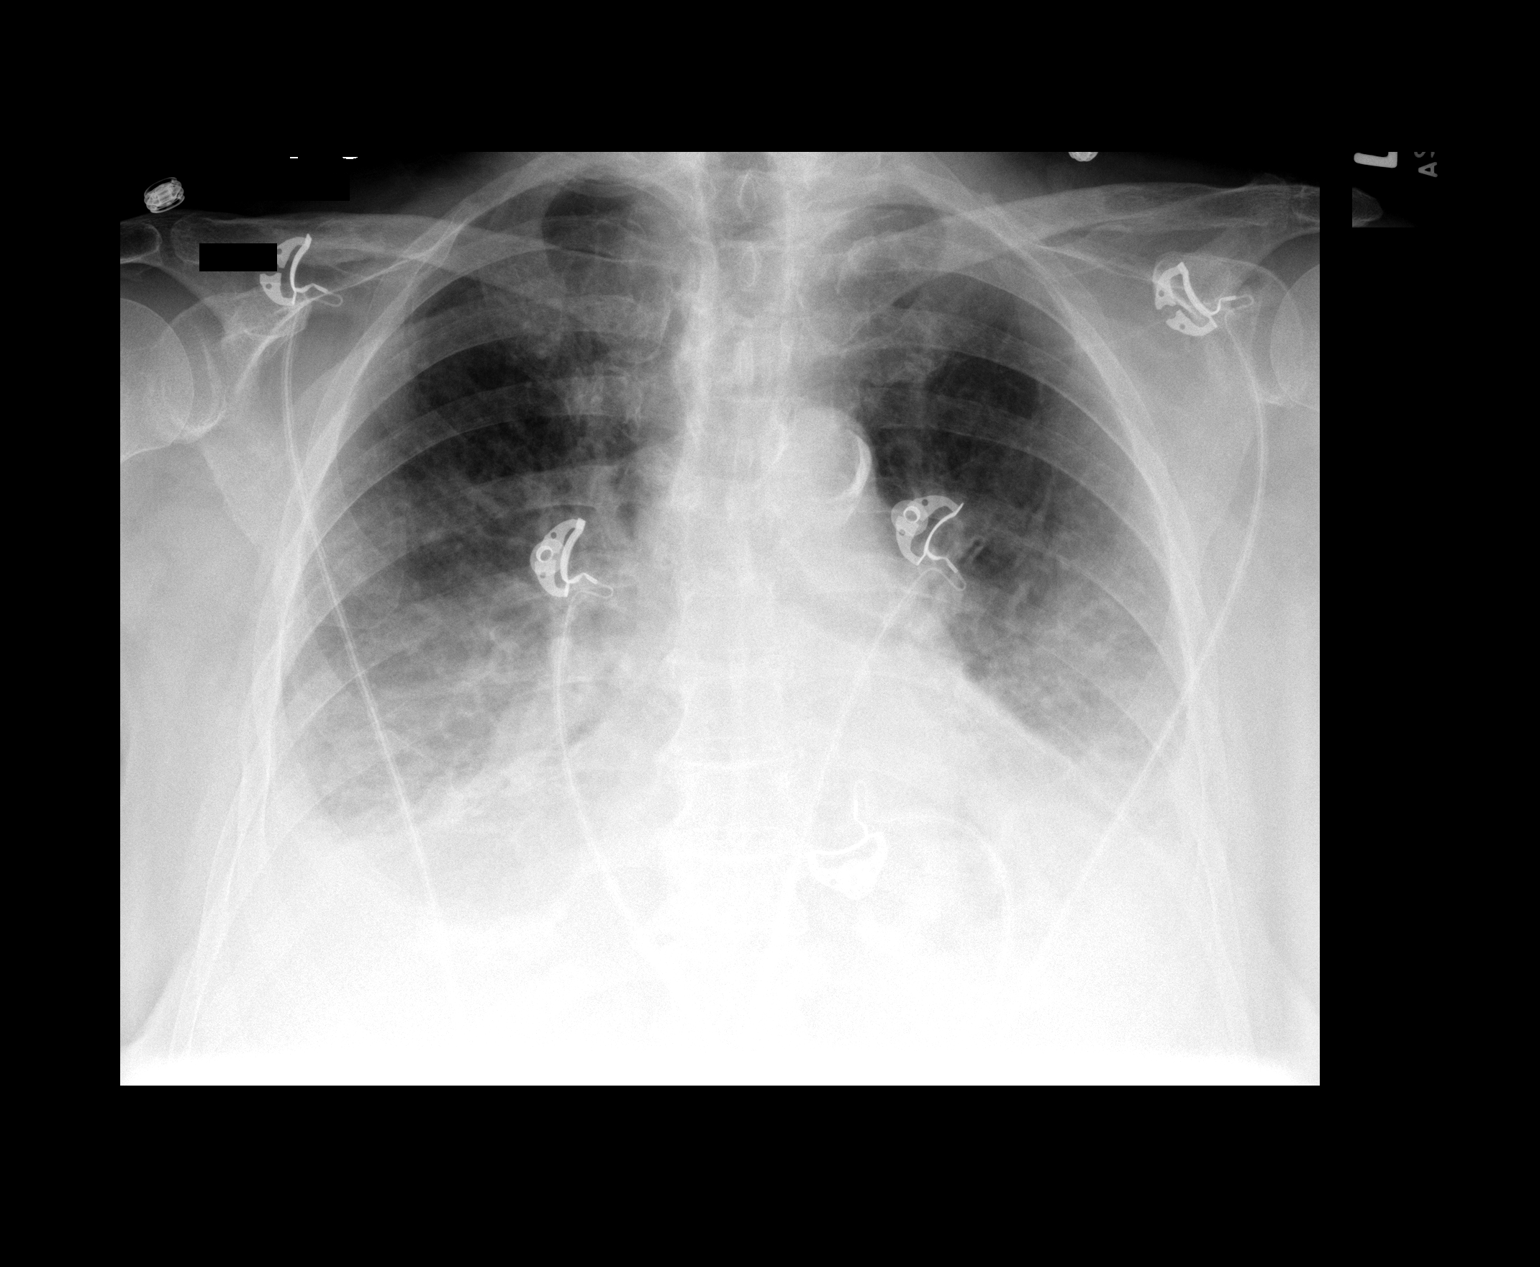

[1 of 1 positions shown; findings below may reference images not displayed]

FINDINGS: Enlargement of cardiac silhouette with pulmonary vascular
congestion.
Perihilar to basilar interstitial infiltrates likely pulmonary
edema and CHF increased since previous exam.
Increased bibasilar effusions and minimal atelectasis.
Dense atherosclerotic calcification aortic arch.
No pneumothorax.
Bones demineralized.
IMPRESSION: Increased CHF with associated bibasilar effusions and atelectasis.

## 2013-02-16 ENCOUNTER — Ambulatory Visit (INDEPENDENT_AMBULATORY_CARE_PROVIDER_SITE_OTHER): Payer: Medicare Other | Admitting: Internal Medicine

## 2013-02-16 ENCOUNTER — Encounter: Payer: Self-pay | Admitting: Internal Medicine

## 2013-02-16 ENCOUNTER — Other Ambulatory Visit (INDEPENDENT_AMBULATORY_CARE_PROVIDER_SITE_OTHER): Payer: Medicare Other

## 2013-02-16 VITALS — BP 116/64 | HR 74 | Temp 97.6°F | Resp 16 | Wt 147.0 lb

## 2013-02-16 DIAGNOSIS — E785 Hyperlipidemia, unspecified: Secondary | ICD-10-CM

## 2013-02-16 DIAGNOSIS — I10 Essential (primary) hypertension: Secondary | ICD-10-CM

## 2013-02-16 DIAGNOSIS — E1051 Type 1 diabetes mellitus with diabetic peripheral angiopathy without gangrene: Secondary | ICD-10-CM

## 2013-02-16 DIAGNOSIS — E1059 Type 1 diabetes mellitus with other circulatory complications: Secondary | ICD-10-CM

## 2013-02-16 DIAGNOSIS — I739 Peripheral vascular disease, unspecified: Secondary | ICD-10-CM

## 2013-02-16 LAB — BASIC METABOLIC PANEL
BUN: 42 mg/dL — ABNORMAL HIGH (ref 6–23)
CO2: 27 mEq/L (ref 19–32)
Calcium: 10.4 mg/dL (ref 8.4–10.5)
Creatinine, Ser: 1.7 mg/dL — ABNORMAL HIGH (ref 0.4–1.2)
GFR: 30.87 mL/min — ABNORMAL LOW (ref >60.00)
Glucose, Bld: 139 mg/dL — ABNORMAL HIGH (ref 70–99)
Sodium: 141 mEq/L (ref 135–145)

## 2013-02-16 LAB — LIPID PANEL: Total CHOL/HDL Ratio: 2

## 2013-02-16 MED ORDER — VALSARTAN 320 MG PO TABS: 320.0000 mg | ORAL_TABLET | Freq: Every day | ORAL | Status: DC

## 2013-02-16 NOTE — Assessment & Plan Note (Signed)
She has achieved her goal and is doing well on pravachol

## 2013-02-16 NOTE — Progress Notes (Signed)
  Subjective:    Patient ID: Emma Yu, female    DOB: 1927-02-22, 79 y.o.   MRN: [id number]  Hyperlipidemia This is a chronic problem. The current episode started more than 1 year ago. The problem is controlled. Recent lipid tests were reviewed and are variable. Exacerbating diseases include diabetes. She has no history of chronic renal disease, hypothyroidism, liver disease, obesity or nephrotic syndrome. Pertinent negatives include no chest pain, focal sensory loss, focal weakness, leg pain, myalgias or shortness of breath. Current antihyperlipidemic treatment includes statins and herbal therapy. The current treatment provides moderate improvement of lipids. There are no compliance problems.       Review of Systems  Constitutional: Negative.  Negative for fever, chills, diaphoresis, activity change, appetite change, fatigue and unexpected weight change.  HENT: Negative.   Eyes: Negative.   Respiratory: Negative.  Negative for cough, chest tightness, shortness of breath, wheezing and stridor.   Cardiovascular: Negative.  Negative for chest pain, palpitations and leg swelling.  Gastrointestinal: Negative.  Negative for nausea, vomiting, abdominal pain, diarrhea and constipation.  Endocrine: Negative.  Negative for polydipsia, polyphagia and polyuria.  Genitourinary: Negative.   Musculoskeletal: Negative.  Negative for myalgias.  Skin: Negative.   Allergic/Immunologic: Negative.   Neurological: Negative for dizziness, tremors, focal weakness, speech difficulty, weakness, light-headedness and numbness.  Hematological: Negative.  Negative for adenopathy. Does not bruise/bleed easily.  Psychiatric/Behavioral: Negative.        Objective:   Physical Exam  Vitals reviewed. Constitutional: [name] oriented to person, place, and time. She appears well-developed and well-nourished. No distress.  HENT:  Head: Normocephalic and atraumatic.  Mouth/Throat: Oropharynx is clear and moist.  No oropharyngeal exudate.  Eyes: Conjunctivae are normal. Right eye exhibits no discharge. Left eye exhibits no discharge. No scleral icterus.  Neck: Normal range of motion. Neck supple. No JVD present. No tracheal deviation present. No thyromegaly present.  Cardiovascular: Normal rate, regular rhythm and intact distal pulses.  Exam reveals no gallop and no friction rub.   Murmur heard. Pulmonary/Chest: Effort normal and breath sounds normal. No stridor. No respiratory distress. She has no wheezes. She has no rales. She exhibits no tenderness.  Abdominal: Soft. Bowel sounds are normal. She exhibits no distension and no mass. There is no tenderness. There is no rebound and no guarding.  Musculoskeletal: Normal range of motion. She exhibits no edema and no tenderness.  Lymphadenopathy:    She has no cervical adenopathy.  Neurological: [name] oriented to person, place, and time.  Skin: Skin is warm and dry. No rash noted. [name] not diaphoretic. No erythema. No pallor.  Psychiatric: She has a normal mood and affect. Her behavior is normal. Judgment and thought content normal.      Lab Results  Component Value Date   WBC 6.5 01/24/2012   HGB 12.7 01/24/2012   HCT 41.0 01/24/2012   PLT 186 01/24/2012   GLUCOSE 189* 08/06/2012   CHOL 109 09/10/2011   TRIG 121.0 09/10/2011   HDL 49.90 09/10/2011   LDLCALC 35 09/10/2011   ALT 9 12/10/2011   AST 14 12/10/2011   NA 140 08/06/2012   K 4.7 08/06/2012   CL 104 08/06/2012   CREATININE 1.2 08/06/2012   BUN 44* 08/06/2012   CO2 32 08/06/2012   TSH 0.511 12/10/2011   INR 1.18 12/25/2011   HGBA1C 6.9* 01/09/2013   MICROALBUR 0.68 07/11/2012      Assessment & Plan:

## 2013-02-16 NOTE — Assessment & Plan Note (Signed)
Her BP is well controlled 

## 2013-02-16 NOTE — Assessment & Plan Note (Signed)
Her renal function has declined some She remains slightly dehydrated I have asked her to decrease her lasix by one half the dose She will avoid all nephrotoxic agents like nsaids

## 2013-02-16 NOTE — Patient Instructions (Signed)

## 2013-02-26 IMAGING — CR DG CHEST 2V
2 series · 2 of 2 positions shown · non-contrast
Comparison: 12/28/2011

CLINICAL DATA: Atrial fibrillation, heart failure

CHEST - 2 VIEW

[view not recorded (1 of 2)]
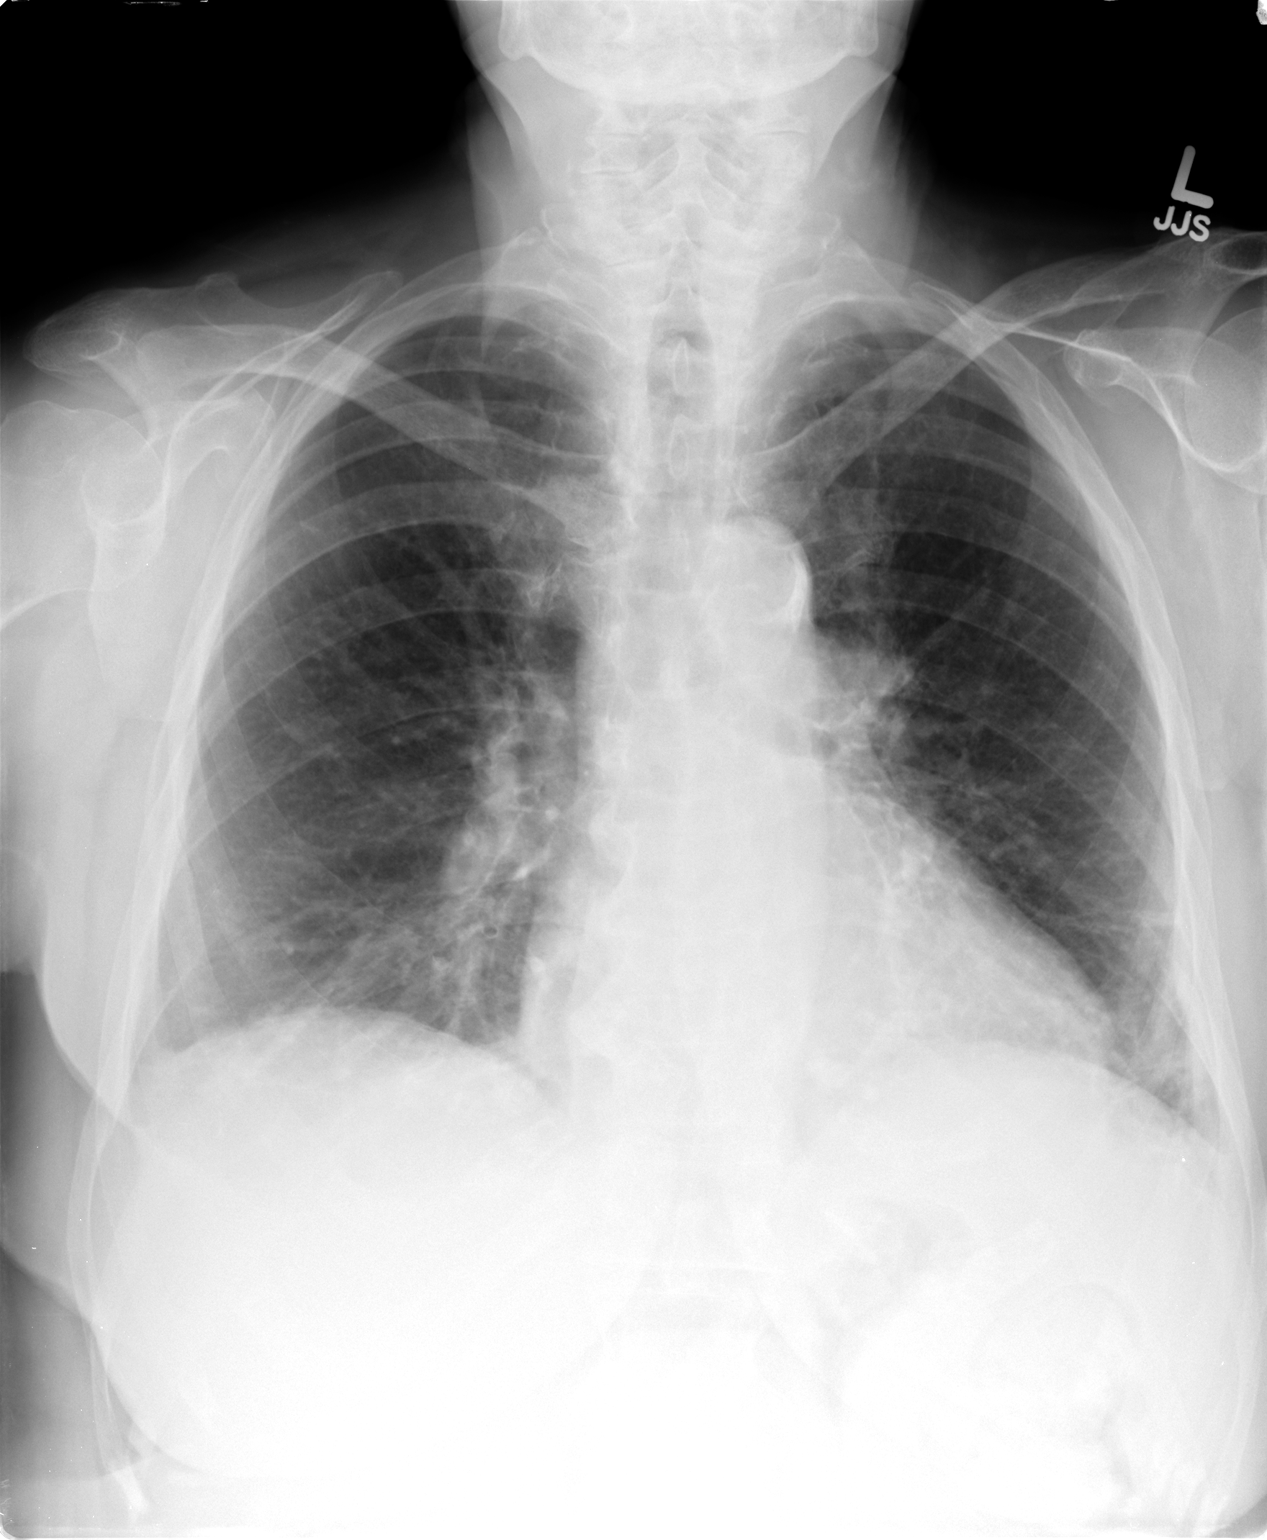

[view not recorded (2 of 2)]
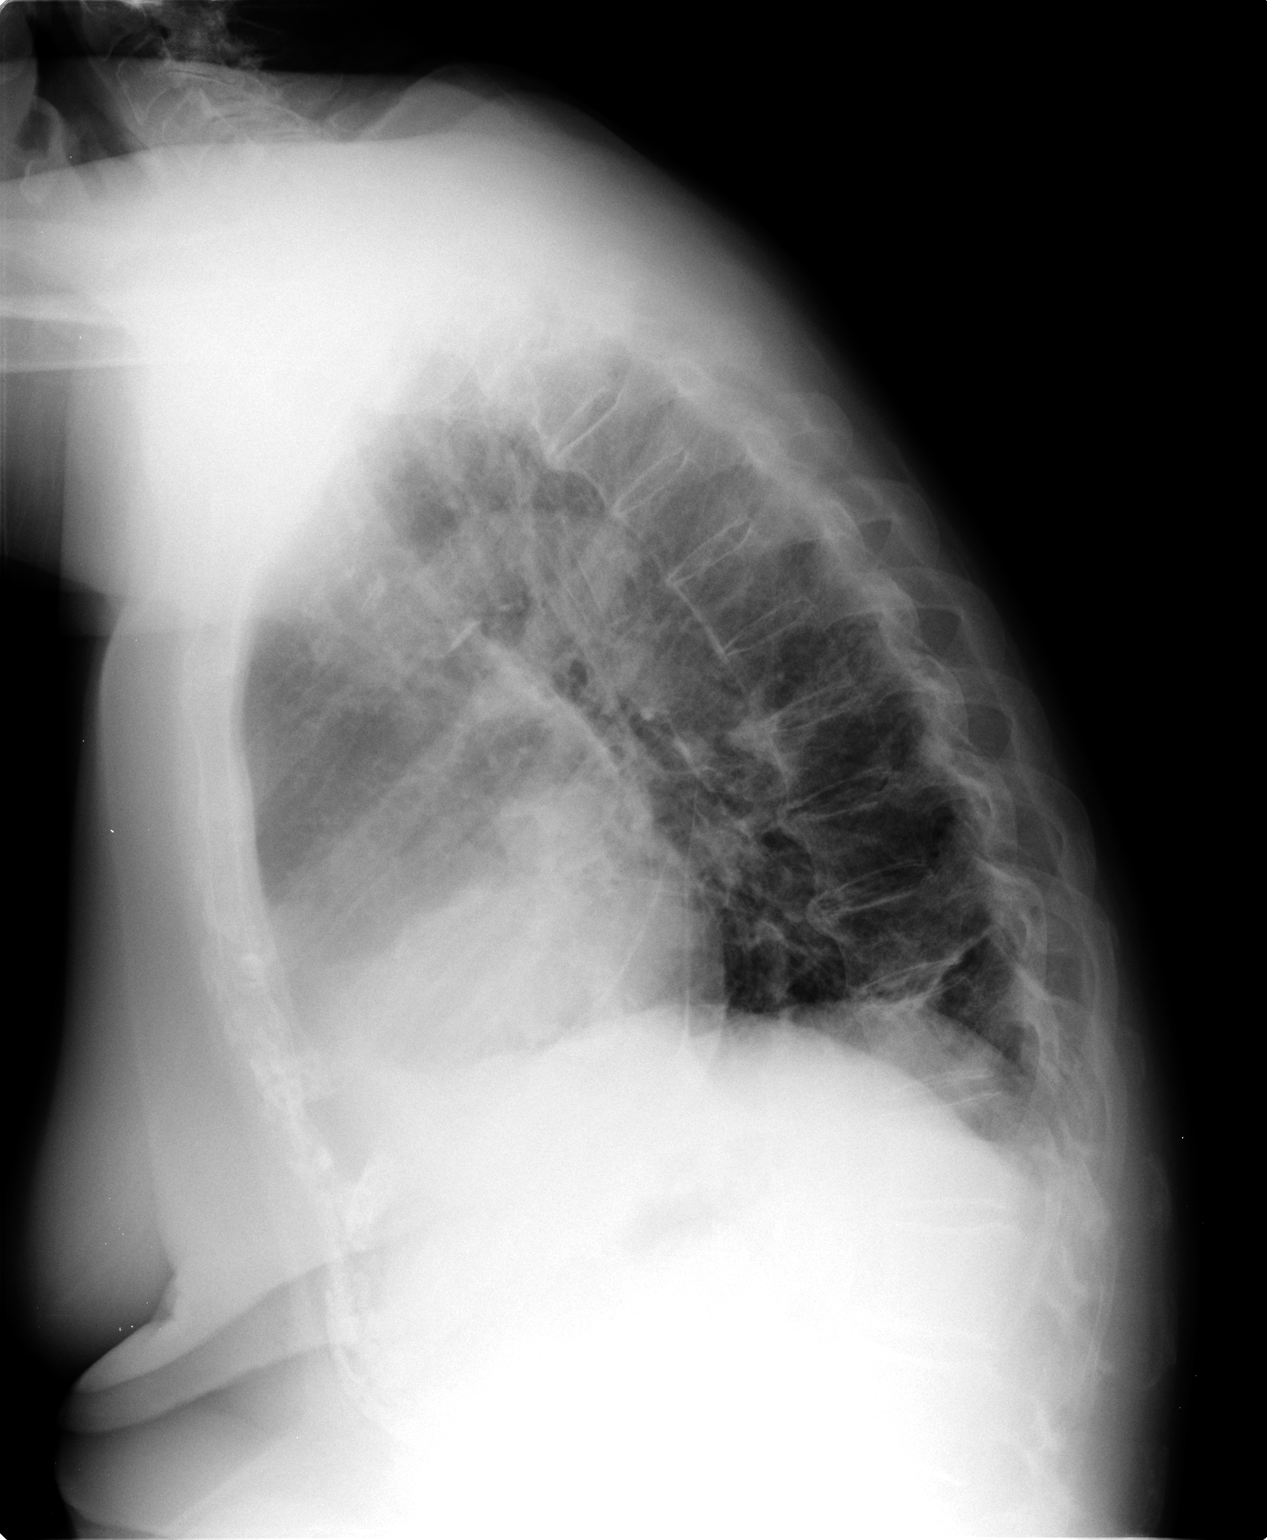

[2 of 2 positions shown; findings below may reference images not displayed]

FINDINGS: Cardiomediastinal silhouette is stable.  No acute
infiltrate or pulmonary edema.  Atherosclerotic calcifications of
thoracic aorta again noted.  Stable osteopenia and mild
degenerative changes thoracic spine.  Streaky bilateral basilar
atelectasis or scarring.
IMPRESSION: No acute infiltrate or pulmonary edema.  Cardiomegaly again noted.
Streaky bilateral basilar atelectasis or scarring.

## 2013-03-01 ENCOUNTER — Other Ambulatory Visit: Payer: Self-pay | Admitting: Physician Assistant

## 2013-03-19 ENCOUNTER — Ambulatory Visit (INDEPENDENT_AMBULATORY_CARE_PROVIDER_SITE_OTHER): Payer: Medicare Other | Admitting: Cardiovascular Disease

## 2013-03-19 ENCOUNTER — Encounter: Payer: Self-pay | Admitting: Cardiovascular Disease

## 2013-03-19 VITALS — BP 150/80 | HR 64 | Wt 146.0 lb

## 2013-03-19 DIAGNOSIS — I4891 Unspecified atrial fibrillation: Secondary | ICD-10-CM

## 2013-03-19 DIAGNOSIS — R0989 Other specified symptoms and signs involving the circulatory and respiratory systems: Secondary | ICD-10-CM

## 2013-03-19 DIAGNOSIS — I359 Nonrheumatic aortic valve disorder, unspecified: Secondary | ICD-10-CM

## 2013-03-19 DIAGNOSIS — I872 Venous insufficiency (chronic) (peripheral): Secondary | ICD-10-CM

## 2013-03-19 DIAGNOSIS — I739 Peripheral vascular disease, unspecified: Secondary | ICD-10-CM

## 2013-03-19 DIAGNOSIS — I350 Nonrheumatic aortic (valve) stenosis: Secondary | ICD-10-CM

## 2013-03-19 LAB — HM MAMMOGRAPHY: HM Mammogram: NORMAL

## 2013-03-19 NOTE — Assessment & Plan Note (Signed)
Moderate bilateral carotid disease F/u duplex 4/15

## 2013-03-19 NOTE — Assessment & Plan Note (Signed)
Improved with vasculera Cellulitis resolved continue current diuretic dose F/U Dr Kirke Corin

## 2013-03-19 NOTE — Assessment & Plan Note (Signed)
Mild Not an operative candidate Consider f/u echo in a year

## 2013-03-19 NOTE — Assessment & Plan Note (Signed)
F/U Dr Kirke Corin ABIs' stable with limited claudication

## 2013-03-19 NOTE — Assessment & Plan Note (Signed)
Good rate control No anticoagulaiton due to age and frailty

## 2013-03-19 NOTE — Patient Instructions (Signed)
Your physician wants you to follow-up in:   MARCH OR April  WITH DR Eden Emms   January WITH DR Katheren Puller will receive a reminder letter in the mail two months in advance. If you don't receive a letter, please call our office to schedule the follow-up appointment. Your physician recommends that you continue on your current medications as directed. Please refer to the Current Medication list given to you today. Your physician has requested that you have a carotid duplex. This test is an ultrasound of the carotid arteries in your neck. It looks at blood flow through these arteries that supply the brain with blood. Allow one hour for this exam. There are no restrictions or special instructions. DUE TO HAVE DONE IN APRIL

## 2013-03-19 NOTE — Progress Notes (Signed)
Patient ID: Emma Yu, female   DOB: [birth year], 79 y.o.   MRN: [id number] This is an 79 year old female who is here today for a follow up  She has a history of mod aortic stenosis, Atrial fibrillation, diastolic heart failure, peripheral arterial disease, status post bilateral common iliac artery stenting in 2002 done by Dr. Karleen Hampshire with known significant right SFA disease, diabetes, hypertension, hyperlipidemia. LHC 01/10/11: Luminal irregularities.   She had multiple hospitalizations over the last year related to dehydration, anemia, atrial fibrillation and diastolic heart failure. She also had a revision of right hip surgery with very difficult healing. She walks very slowly with a walker. [name] deemed not to be  an anticoagulation candidate.   She has venous reflux disease and is on vasculera and has had Rx for cellulitis last year   09/17/12 ABI right .79 and on left .89   Duplex 4/14 40-59% bilateral ICA   ROS: Denies fever, malais, weight loss, blurry vision, decreased visual acuity, cough, sputum, SOB, hemoptysis, pleuritic pain, palpitaitons, heartburn, abdominal pain, melena, lower extremity edema, claudication, or rash.  All other systems reviewed and negative  General: Affect appropriate Chronically ill kyphotic female HEENT: normal Neck supple with no adenopathy JVP normal left bruits no thyromegaly Lungs clear with no wheezing and good diaphragmatic motion Heart:  S1/S2 Midl AS  murmur, no rub, gallop or click PMI normal Abdomen: benighn, BS positve, no tenderness, no AAA no bruit.  No HSM or HJR Distal pulses intact with no bruits No edema Neuro non-focal Chronic venous insuf left greater than right  No muscular weakness   Current Outpatient Prescriptions  Medication Sig Dispense Refill  . Ascorbic Acid (VITAMIN C) 500 MG tablet Take 500 mg by mouth daily.        Marland Kitchen aspirin EC 81 MG tablet Take 81 mg by mouth 2 (two) times daily.      Marland Kitchen b complex vitamins  tablet Take 1 tablet by mouth daily.        . Cholecalciferol (VITAMIN D3) 2000 UNITS capsule Take 2,000 Units by mouth daily.        . cloNIDine (CATAPRES) 0.2 MG tablet Take 1 tablet (0.2 mg total) by mouth 2 (two) times daily.  60 tablet  5  . co-enzyme Q-10 50 MG capsule Take 50 mg by mouth daily.      . Dietary Management Product (VASCULERA) TABS Take 1 capsule by mouth daily.  30 tablet  11  . fish oil-omega-3 fatty acids 1000 MG capsule Take 2 g by mouth daily.        . furosemide (LASIX) 40 MG tablet Take 40 mg by mouth daily.      Marland Kitchen gabapentin (NEURONTIN) 300 MG capsule Take 2 capsules (600 mg total) by mouth at bedtime.  60 capsule  5  . Insulin Infusion Pump Supplies (INSET INFUSION SET 23" ) MISC 1 Device by Does not apply route every 3 (three) days.      . Insulin Infusion Pump Supplies (INSULIN PUMP SYRINGE RESERVOIR) MISC 1 Device by Does not apply route every 3 (three) days. Animas 2 ml      . insulin lispro (HUMALOG) 100 UNIT/ML injection Inject 55.2 Units into the skin. For use in pump, total of 70 units per day      . ketoconazole (NIZORAL) 2 % cream Apply topically 2 (two) times daily.  60 g  1  . metoprolol tartrate (LOPRESSOR) 25 MG tablet TAKE 1 TABLET BY MOUTH  TWICE A DAY  60 tablet  9  . nystatin (MYCOSTATIN/NYSTOP) 100000 UNIT/GM POWD       . omeprazole (PRILOSEC) 40 MG capsule Take 40 mg by mouth daily before breakfast.       . pravastatin (PRAVACHOL) 20 MG tablet TAKE 1 TABLET BY MOUTH IN THE EVENING  30 tablet  5  . Probiotic Product (PROBIOTIC PO) Take 1 tablet by mouth daily at 10 pm.      . traMADol (ULTRAM) 50 MG tablet Take 1 tablet (50 mg total) by mouth every 6 (six) hours as needed for pain. For pain  120 tablet  11  . valsartan (DIOVAN) 320 MG tablet Take 1 tablet (320 mg total) by mouth daily. Take o.ne tab at bedtime  90 tablet  3  . ferrous sulfate 325 (65 FE) MG tablet Take 1 tablet (325 mg total) by mouth 3 (three) times daily with meals.  90 tablet  1    No current facility-administered medications for this visit.    Allergies  Carvedilol; Clindamycin/lincomycin; Diltiazem hcl; Fenofibrate; Hctz; Nisoldipine; Nitroglycerin; Propranolol; Septra; Derl Barrow  Electrocardiogram:  4/22 Afib rate 62  LAD   Assessment and Plan

## 2013-04-01 ENCOUNTER — Other Ambulatory Visit: Payer: Self-pay | Admitting: Internal Medicine

## 2013-04-17 ENCOUNTER — Ambulatory Visit (INDEPENDENT_AMBULATORY_CARE_PROVIDER_SITE_OTHER): Payer: Medicare Other | Admitting: Endocrinology

## 2013-04-17 ENCOUNTER — Encounter: Payer: Self-pay | Admitting: Endocrinology

## 2013-04-17 VITALS — BP 134/74 | HR 76 | Ht 59.0 in | Wt 149.0 lb

## 2013-04-17 DIAGNOSIS — E1051 Type 1 diabetes mellitus with diabetic peripheral angiopathy without gangrene: Secondary | ICD-10-CM

## 2013-04-17 DIAGNOSIS — I739 Peripheral vascular disease, unspecified: Secondary | ICD-10-CM

## 2013-04-17 DIAGNOSIS — E1059 Type 1 diabetes mellitus with other circulatory complications: Secondary | ICD-10-CM

## 2013-04-17 NOTE — Patient Instructions (Addendum)
A diabetes blood test is requested for you today.  We'll contact you with results.  take a basal rate to 0.8 unit/hr, 24 hrs per day.   Take a bolus of 11 units with each meal.   continue correction bolus (which some people call "sensitivity," or "insulin sensitivity ratio," or just "isr") of 1 unit for each by 25 which your glucose exceeds 120. check your blood sugar 4 times a day--before the 3 meals, and at bedtime.  also check if you have symptoms of your blood sugar being too high or too low.  please keep a record of the readings and bring it to your next appointment here.  please call us sooner if you are having low blood sugar episodes. Please make a follow-up appointment in 3 months.

## 2013-04-17 NOTE — Progress Notes (Signed)
Subjective:    Patient ID: Emma Yu, female    DOB: [birth year], 79 y.o.   MRN: [id number]  HPI Pt returns for f/u of type 1 dm (dx'ed 1999; she has mild if any neuropathy of the lower extremities; she has associated by CAD and PAD; she has a one-touch ping insulin pump; she has declined continuous glucose monitor; she has never had severe hypoglycemia or DKA). she brings a record of her cbg's which i have reviewed today.  It varies from 91-200's, but most are in the 100's.  There is no trend throughout the day.  She averages a total of approx 60 units of humalog per day, via her pump.  She has mild hypoglycemia approx once a month.   Past Medical History  Diagnosis Date  . PAD (peripheral artery disease)     s/p bilateral comon iliac artery stenting in 2002. Known significant  R SFA  disease. carotid dz noted 11/2011 followed by Dr. Imogene Burn  . Diabetes mellitus   . Hypertension   . Hyperlipidemia   . Venous insufficiency   . Chronic diastolic heart failure     Echocardiogram 12/10/11: Moderate LVH, EF 65-70%, moderate aortic stenosis, mean gradient 20, mild MS  . Atrial fibrillation     not a coumadin candidate secondary to fall risk  . DJD (degenerative joint disease) of hip     s/p R THR 10/2010  . Aortic stenosis     Moderate by echo 4/13 - mean 20 mmHg  . Mitral stenosis     Mild by echo 4/13  . Anemia     iron deficient  . Acute respiratory failure     12/2011 admission - decreased O2 sats on ambulation, improved by time of discharge  . CAD (coronary artery disease)     minimal plaque by cath 5/12    Past Surgical History  Procedure Laterality Date  . Total hip arthroplasty  1991, 1994    redo in 1994  . Lumbar disc surgery  1999  . Cardiac catheterization  01/10/2011    No significant CAD    History   Social History  . Marital Status: Widowed    Spouse Name: N/A    Number of Children: N/A  . Years of Education: N/A   Occupational History  .      retired    Social History Main Topics  . Smoking status: Former Smoker -- 2.00 packs/day for 25 years    Types: Cigarettes    Quit date: 08/20/1985  . Smokeless tobacco: Never Used     Comment: Retired-widowed 89, lives with son  . Alcohol Use: No  . Drug Use: No  . Sexual Activity: Not Currently   Other Topics Concern  . Not on file   Social History Narrative   Lives with son   Widowed 89   Quit smoking in 1987    Current Outpatient Prescriptions on File Prior to Visit  Medication Sig Dispense Refill  . Ascorbic Acid (VITAMIN C) 500 MG tablet Take 500 mg by mouth daily.        Marland Kitchen aspirin EC 81 MG tablet Take 81 mg by mouth 2 (two) times daily.      Marland Kitchen b complex vitamins tablet Take 1 tablet by mouth daily.        . Cholecalciferol (VITAMIN D3) 2000 UNITS capsule Take 2,000 Units by mouth daily.        . cloNIDine (CATAPRES) 0.2 MG tablet Take 1 tablet (  0.2 mg total) by mouth 2 (two) times daily.  60 tablet  5  . co-enzyme Q-10 50 MG capsule Take 50 mg by mouth daily.      . Dietary Management Product (VASCULERA) TABS Take 1 capsule by mouth daily.  30 tablet  11  . DIOVAN 320 MG tablet TAKE 1 TABLET EVERY DAY  90 tablet  3  . fish oil-omega-3 fatty acids 1000 MG capsule Take 2 g by mouth daily.        . furosemide (LASIX) 40 MG tablet Take 40 mg by mouth daily.      Marland Kitchen gabapentin (NEURONTIN) 300 MG capsule Take 2 capsules (600 mg total) by mouth at bedtime.  60 capsule  5  . Insulin Infusion Pump Supplies (INSET INFUSION SET 23" ) MISC 1 Device by Does not apply route every 3 (three) days.      . Insulin Infusion Pump Supplies (INSULIN PUMP SYRINGE RESERVOIR) MISC 1 Device by Does not apply route every 3 (three) days. Animas 2 ml      . insulin lispro (HUMALOG) 100 UNIT/ML injection Inject 55.2 Units into the skin. For use in pump, total of 70 units per day      . ketoconazole (NIZORAL) 2 % cream Apply topically 2 (two) times daily.  60 g  1  . metoprolol tartrate (LOPRESSOR) 25 MG  tablet TAKE 1 TABLET BY MOUTH TWICE A DAY  60 tablet  9  . nystatin (MYCOSTATIN/NYSTOP) 100000 UNIT/GM POWD       . omeprazole (PRILOSEC) 40 MG capsule Take 40 mg by mouth daily before breakfast.       . pravastatin (PRAVACHOL) 20 MG tablet TAKE 1 TABLET BY MOUTH IN THE EVENING  30 tablet  5  . Probiotic Product (PROBIOTIC PO) Take 1 tablet by mouth daily at 10 pm.      . traMADol (ULTRAM) 50 MG tablet Take 1 tablet (50 mg total) by mouth every 6 (six) hours as needed for pain. For pain  120 tablet  11  . ferrous sulfate 325 (65 FE) MG tablet Take 1 tablet (325 mg total) by mouth 3 (three) times daily with meals.  90 tablet  1   No current facility-administered medications on file prior to visit.    Allergies  Allergen Reactions  . Carvedilol Other (See Comments)    Heart stops  . Clindamycin/Lincomycin Other (See Comments)    tremors  . Diltiazem Hcl Other (See Comments)    Chest pain  . Fenofibrate Other (See Comments)    Unknown - [facility] - MAR  . Hctz [Hydrochlorothiazide] Other (See Comments)    Chest pain  . Nisoldipine Other (See Comments)    Chest pain  . Nitroglycerin Other (See Comments)    Unknown - [facility]  . Propranolol Diarrhea    Chest pain  . Septra [Sulfamethoxazole-Tmp Ds]     Rash, cyst  . Donata Duff Fumarate] Other (See Comments)    Unknown reaction  . Valturna [Aliskiren-Valsartan] Other (See Comments)    Unknown reaction    Family History  Problem Relation Age of Onset  . Diabetes Father   . Diabetes Other     5/8 sibs    BP 134/74  Pulse 76  Ht 4\' 11"  (1.499 m)  Wt 149 lb (67.586 kg)  BMI 30.08 kg/m2  SpO2 95%  Review of Systems Denies LOC and weight change.     Objective:   Physical Exam VITAL SIGNS:  See vs  page GENERAL: no distress  Lab Results  Component Value Date   HGBA1C 6.9* 04/17/2013      Assessment & Plan:  DM: well-controlled.  This insulin pump regimen was chosen from multiple options, as it best matches her  insulin to her changing requirements throughout the day.   CAD: in this setting, the benefits of glycemic control must be weighed against the risks of hypoglycemia.   PAD: this limits exercise rx of DM.

## 2013-04-20 ENCOUNTER — Other Ambulatory Visit: Payer: Self-pay | Admitting: Internal Medicine

## 2013-04-24 ENCOUNTER — Other Ambulatory Visit: Payer: Self-pay | Admitting: Internal Medicine

## 2013-05-05 ENCOUNTER — Encounter: Payer: Self-pay | Admitting: Internal Medicine

## 2013-05-05 ENCOUNTER — Ambulatory Visit (INDEPENDENT_AMBULATORY_CARE_PROVIDER_SITE_OTHER): Payer: Medicare Other | Admitting: Internal Medicine

## 2013-05-05 VITALS — BP 142/70 | HR 74 | Temp 97.5°F | Ht 64.0 in | Wt 146.8 lb

## 2013-05-05 DIAGNOSIS — Z23 Encounter for immunization: Secondary | ICD-10-CM

## 2013-05-05 DIAGNOSIS — R350 Frequency of micturition: Secondary | ICD-10-CM

## 2013-05-05 LAB — POCT URINALYSIS DIPSTICK
Spec Grav, UA: 1.01
Urobilinogen, UA: NEGATIVE

## 2013-05-05 MED ORDER — CEPHALEXIN 500 MG PO CAPS: 500.0000 mg | ORAL_CAPSULE | Freq: Four times a day (QID) | ORAL | Status: DC

## 2013-05-05 NOTE — Patient Instructions (Signed)
Please take all new medication as prescribed - the antibiotic Please continue all other medications as before, and refills have been done if requested. Please have the pharmacy call with any other refills you may need.  Please keep your appointments with your specialists as you have planned  Your specimen will be sent for culture, to try to make sure the antibiotic given today should work

## 2013-05-05 NOTE — Progress Notes (Signed)
Subjective:    Patient ID: Emma Yu, female    DOB: 21-Jun-1927, 79 y.o.   MRN: [id number]  HPI  Here with friend for assist, mentions chronic left leg pain, walks with cane, hard to step up on exam table.  However, here with 2 days onset urinary freq and small leakages/incontinence.  Denies urinary symptoms such as dysuria, urgency, flank pain, hematuria or n/v, fever, chills. Past Medical History  Diagnosis Date  . PAD (peripheral artery disease)     s/p bilateral comon iliac artery stenting in 2002. Known significant  R SFA  disease. carotid dz noted 11/2011 followed by Dr. Imogene Burn  . Diabetes mellitus   . Hypertension   . Hyperlipidemia   . Venous insufficiency   . Chronic diastolic heart failure     Echocardiogram 12/10/11: Moderate LVH, EF 65-70%, moderate aortic stenosis, mean gradient 20, mild MS  . Atrial fibrillation     not a coumadin candidate secondary to fall risk  . DJD (degenerative joint disease) of hip     s/p R THR 10/2010  . Aortic stenosis     Moderate by echo 4/13 - mean 20 mmHg  . Mitral stenosis     Mild by echo 4/13  . Anemia     iron deficient  . Acute respiratory failure     12/2011 admission - decreased O2 sats on ambulation, improved by time of discharge  . CAD (coronary artery disease)     minimal plaque by cath 5/12   Past Surgical History  Procedure Laterality Date  . Total hip arthroplasty  1991, 1994    redo in 1994  . Lumbar disc surgery  1999  . Cardiac catheterization  01/10/2011    No significant CAD    reports that she quit smoking about 27 years ago. Her smoking use included Cigarettes. She has a 50 pack-year smoking history. She has never used smokeless tobacco. She reports that she does not drink alcohol or use illicit drugs. family history includes Diabetes in her father and other. Allergies  Allergen Reactions  . Carvedilol Other (See Comments)    Heart stops  . Clindamycin/Lincomycin Other (See Comments)    tremors  .  Diltiazem Hcl Other (See Comments)    Chest pain  . Fenofibrate Other (See Comments)    Unknown - [facility] - MAR  . Hctz [Hydrochlorothiazide] Other (See Comments)    Chest pain  . Nisoldipine Other (See Comments)    Chest pain  . Nitroglycerin Other (See Comments)    Unknown - [facility]  . Propranolol Diarrhea    Chest pain  . Septra [Sulfamethoxazole-Tmp Ds]     Rash, cyst  . Donata Duff Fumarate] Other (See Comments)    Unknown reaction  . Valturna [Aliskiren-Valsartan] Other (See Comments)    Unknown reaction   Current Outpatient Prescriptions on File Prior to Visit  Medication Sig Dispense Refill  . Ascorbic Acid (VITAMIN C) 500 MG tablet Take 500 mg by mouth daily.        Marland Kitchen aspirin EC 81 MG tablet Take 81 mg by mouth 2 (two) times daily.      Marland Kitchen b complex vitamins tablet Take 1 tablet by mouth daily.        . Cholecalciferol (VITAMIN D3) 2000 UNITS capsule Take 2,000 Units by mouth daily.        . cloNIDine (CATAPRES) 0.2 MG tablet Take 1 tablet (0.2 mg total) by mouth 2 (two) times daily.  60 tablet  5  . co-enzyme Q-10 50 MG capsule Take 50 mg by mouth daily.      . Dietary Management Product (VASCULERA) TABS Take 1 capsule by mouth daily.  30 tablet  11  . DIOVAN 320 MG tablet TAKE 1 TABLET EVERY DAY  90 tablet  3  . fish oil-omega-3 fatty acids 1000 MG capsule Take 2 g by mouth daily.        . furosemide (LASIX) 40 MG tablet Take 40 mg by mouth daily.      Marland Kitchen gabapentin (NEURONTIN) 300 MG capsule TAKE 2 CAPSULES BY MOUTH AT BEDTIME  180 capsule  3  . Insulin Infusion Pump Supplies (INSET INFUSION SET 23" ) MISC 1 Device by Does not apply route every 3 (three) days.      . Insulin Infusion Pump Supplies (INSULIN PUMP SYRINGE RESERVOIR) MISC 1 Device by Does not apply route every 3 (three) days. Animas 2 ml      . insulin lispro (HUMALOG) 100 UNIT/ML injection Inject 55.2 Units into the skin. For use in pump, total of 70 units per day      . ketoconazole (NIZORAL) 2 % cream  Apply topically 2 (two) times daily.  60 g  1  . metoprolol tartrate (LOPRESSOR) 25 MG tablet TAKE 1 TABLET BY MOUTH TWICE A DAY  60 tablet  9  . nystatin (MYCOSTATIN/NYSTOP) 100000 UNIT/GM POWD       . omeprazole (PRILOSEC) 40 MG capsule Take 40 mg by mouth daily before breakfast.       . pravastatin (PRAVACHOL) 20 MG tablet TAKE 1 TABLET BY MOUTH IN THE EVENING  30 tablet  5  . Probiotic Product (PROBIOTIC PO) Take 1 tablet by mouth daily at 10 pm.      . traMADol (ULTRAM) 50 MG tablet TAKE 1 TABLET BY MOUTH EVERY 6 HOURS AS NEEDED FOR PAIN  120 tablet  5  . ferrous sulfate 325 (65 FE) MG tablet Take 1 tablet (325 mg total) by mouth 3 (three) times daily with meals.  90 tablet  1   No current facility-administered medications on file prior to visit.    Review of Systems All otherwise neg per pt     Objective:   Physical Exam BP 142/70  Pulse 74  Temp(Src) 97.5 F (36.4 C) (Oral)  Ht 5\' 4"  (1.626 m)  Wt 146 lb 12 oz (66.565 kg)  BMI 25.18 kg/m2  SpO2 92% VS noted, fatigued, but non toxic Constitutional: Pt appears well-developed and well-nourished.  HENT: Head: NCAT.  Right Ear: External ear normal.  Left Ear: External ear normal.  Eyes: Conjunctivae and EOM are normal. Pupils are equal, round, and reactive to light.  Neck: Normal range of motion. Neck supple.  Cardiovascular: Normal rate and regular rhythm.   Pulmonary/Chest: Effort normal and breath sounds normal.  Abd:  Soft, NT, non-distended, + BS, no flank tender, benign exam Neurological: Pt is alert. Not confused  Skin: Skin is warm. No erythema.  Psychiatric: Pt behavior is normal. Thought content normal.     Assessment & Plan:

## 2013-05-05 NOTE — Assessment & Plan Note (Signed)
UA dip c/w UTi, Mild to mod, for antibx course,  to f/u any worsening symptoms or concerns, also for urine cx

## 2013-05-06 ENCOUNTER — Other Ambulatory Visit: Payer: Medicare Other

## 2013-05-06 DIAGNOSIS — R350 Frequency of micturition: Secondary | ICD-10-CM

## 2013-05-08 ENCOUNTER — Encounter: Payer: Self-pay | Admitting: Internal Medicine

## 2013-05-08 LAB — URINE CULTURE

## 2013-06-01 ENCOUNTER — Other Ambulatory Visit: Payer: Self-pay | Admitting: Internal Medicine

## 2013-06-03 ENCOUNTER — Other Ambulatory Visit: Payer: Self-pay | Admitting: Cardiovascular Disease

## 2013-06-18 ENCOUNTER — Ambulatory Visit (INDEPENDENT_AMBULATORY_CARE_PROVIDER_SITE_OTHER): Payer: Medicare Other | Admitting: Internal Medicine

## 2013-06-18 ENCOUNTER — Encounter: Payer: Self-pay | Admitting: Internal Medicine

## 2013-06-18 VITALS — BP 138/60 | HR 70 | Temp 97.8°F | Resp 16 | Ht 64.0 in | Wt 151.5 lb

## 2013-06-18 DIAGNOSIS — G8929 Other chronic pain: Secondary | ICD-10-CM

## 2013-06-18 DIAGNOSIS — I10 Essential (primary) hypertension: Secondary | ICD-10-CM

## 2013-06-18 DIAGNOSIS — M1990 Unspecified osteoarthritis, unspecified site: Secondary | ICD-10-CM

## 2013-06-18 DIAGNOSIS — M549 Dorsalgia, unspecified: Secondary | ICD-10-CM

## 2013-06-18 DIAGNOSIS — N183 Chronic kidney disease, stage 3 unspecified: Secondary | ICD-10-CM

## 2013-06-18 MED ORDER — TRAMADOL HCL 50 MG PO TABS: 100.0000 mg | ORAL_TABLET | Freq: Four times a day (QID) | ORAL | Status: DC | PRN

## 2013-06-18 NOTE — Patient Instructions (Signed)
Back Pain, Adult  Low back pain is very common. About 1 in 5 people have back pain. The cause of low back pain is rarely dangerous. The pain often gets better over time. About half of people with a sudden onset of back pain feel better in just 2 weeks. About 8 in 10 people feel better by 6 weeks.   CAUSES  Some common causes of back pain include:  · Strain of the muscles or ligaments supporting the spine.  · Wear and tear (degeneration) of the spinal discs.  · Arthritis.  · Direct injury to the back.  DIAGNOSIS  Most of the time, the direct cause of low back pain is not known. However, back pain can be treated effectively even when the exact cause of the pain is unknown. Answering your caregiver's questions about your overall health and symptoms is one of the most accurate ways to make sure the cause of your pain is not dangerous. If your caregiver needs more information, he or she may order lab work or imaging tests (X-rays or MRIs). However, even if imaging tests show changes in your back, this usually does not require surgery.  HOME CARE INSTRUCTIONS  For many people, back pain returns. Since low back pain is rarely dangerous, it is often a condition that people can learn to manage on their own.   · Remain active. It is stressful on the back to sit or stand in one place. Do not sit, drive, or stand in one place for more than 30 minutes at a time. Take short walks on level surfaces as soon as pain allows. Try to increase the length of time you walk each day.  · Do not stay in bed. Resting more than 1 or 2 days can delay your recovery.  · Do not avoid exercise or work. Your body is made to move. It is not dangerous to be active, even though your back may hurt. Your back will likely heal faster if you return to being active before your pain is gone.  · Pay attention to your body when you  bend and lift. Many people have less discomfort when lifting if they bend their knees, keep the load close to their bodies, and  avoid twisting. Often, the most comfortable positions are those that put less stress on your recovering back.  · Find a comfortable position to sleep. Use a firm mattress and lie on your side with your knees slightly bent. If you lie on your back, put a pillow under your knees.  · Only take over-the-counter or prescription medicines as directed by your caregiver. Over-the-counter medicines to reduce pain and inflammation are often the most helpful. Your caregiver may prescribe muscle relaxant drugs. These medicines help dull your pain so you can more quickly return to your normal activities and healthy exercise.  · Put ice on the injured area.  · Put ice in a plastic bag.  · Place a towel between your skin and the bag.  · Leave the ice on for 15-20 minutes, 3-4 times a day for the first 2 to 3 days. After that, ice and heat may be alternated to reduce pain and spasms.  · Ask your caregiver about trying back exercises and gentle massage. This may be of some benefit.  · Avoid feeling anxious or stressed. Stress increases muscle tension and can worsen back pain. It is important to recognize when you are anxious or stressed and learn ways to manage it. Exercise is a great option.  SEEK MEDICAL CARE IF:  · You have pain that is not relieved with rest or   medicine.  · You have pain that does not improve in 1 week.  · You have new symptoms.  · You are generally not feeling well.  SEEK IMMEDIATE MEDICAL CARE IF:   · You have pain that radiates from your back into your legs.  · You develop new bowel or bladder control problems.  · You have unusual weakness or numbness in your arms or legs.  · You develop nausea or vomiting.  · You develop abdominal pain.  · You feel faint.  Document Released: 08/06/2005 Document Revised: 02/05/2012 Document Reviewed: 12/25/2010  [employer]® Patient Information ©2014 [employer].

## 2013-06-18 NOTE — Progress Notes (Signed)
  Subjective:    Patient ID: Emma Yu, female    DOB: March 27, 1927, 79 y.o.   MRN: [id number]  Hypertension This is a chronic problem. The current episode started more than 1 year ago. The problem is unchanged. The problem is controlled. Pertinent negatives include no anxiety, blurred vision, chest pain, headaches, malaise/fatigue, neck pain, orthopnea, palpitations, peripheral edema, PND, shortness of breath or sweats. Past treatments include central alpha agonists, diuretics and angiotensin blockers. The current treatment provides moderate improvement. There are no compliance problems.  Hypertensive end-organ damage includes kidney disease. Identifiable causes of hypertension include chronic renal disease.      Review of Systems  Constitutional: Negative.  Negative for fever, chills, malaise/fatigue, diaphoresis, appetite change and fatigue.  HENT: Negative.   Eyes: Negative.  Negative for blurred vision.  Respiratory: Negative.  Negative for cough, chest tightness, shortness of breath and wheezing.   Cardiovascular: Negative.  Negative for chest pain, palpitations, orthopnea, leg swelling and PND.  Gastrointestinal: Negative.  Negative for nausea, vomiting, abdominal pain, diarrhea, constipation and blood in stool.  Endocrine: Negative.   Genitourinary: Negative.   Musculoskeletal: Positive for arthralgias and back pain. Negative for gait problem, joint swelling, myalgias, neck pain and neck stiffness.  Skin: Negative.   Allergic/Immunologic: Negative.   Neurological: Negative.  Negative for tremors, weakness and headaches.  Hematological: Negative.  Negative for adenopathy. Does not bruise/bleed easily.  Psychiatric/Behavioral: Negative.        Objective:   Physical Exam  Vitals reviewed. Constitutional: [name] oriented to person, place, and time. She appears well-developed and well-nourished. No distress.  HENT:  Head: Normocephalic and atraumatic.  Mouth/Throat:  Oropharynx is clear and moist. No oropharyngeal exudate.  Eyes: Conjunctivae are normal. Right eye exhibits no discharge. Left eye exhibits no discharge. No scleral icterus.  Neck: Normal range of motion. Neck supple. No JVD present. No tracheal deviation present. No thyromegaly present.  Cardiovascular: Normal rate, regular rhythm, normal heart sounds and intact distal pulses.  Exam reveals no gallop and no friction rub.   No murmur heard. Pulmonary/Chest: Effort normal and breath sounds normal. No stridor. No respiratory distress. She has no wheezes. She has no rales. She exhibits no tenderness.  Abdominal: Soft. Bowel sounds are normal. She exhibits no distension and no mass. There is no tenderness. There is no rebound and no guarding.  Musculoskeletal: Normal range of motion. She exhibits no edema and no tenderness.  Lymphadenopathy:    She has no cervical adenopathy.  Neurological: [name] oriented to person, place, and time.  Skin: Skin is warm and dry. No rash noted. [name] not diaphoretic. No erythema. No pallor.     Lab Results  Component Value Date   WBC 6.5 01/24/2012   HGB 12.7 01/24/2012   HCT 41.0 01/24/2012   PLT 186 01/24/2012   GLUCOSE 139* 02/16/2013   CHOL 113 02/16/2013   TRIG 142.0 02/16/2013   HDL 47.40 02/16/2013   LDLCALC 37 02/16/2013   ALT 9 12/10/2011   AST 14 12/10/2011   NA 141 02/16/2013   K 5.0 02/16/2013   CL 102 02/16/2013   CREATININE 1.7* 02/16/2013   BUN 42* 02/16/2013   CO2 27 02/16/2013   TSH 2.69 02/16/2013   INR 1.18 12/25/2011   HGBA1C 6.9* 04/17/2013   MICROALBUR 0.68 07/11/2012       Assessment & Plan:

## 2013-06-19 NOTE — Assessment & Plan Note (Signed)
She will continue tramadol as needed for pain 

## 2013-06-19 NOTE — Assessment & Plan Note (Signed)
Her BP is well controlled 

## 2013-06-19 NOTE — Assessment & Plan Note (Signed)
Her renal function is stable 

## 2013-06-28 ENCOUNTER — Other Ambulatory Visit: Payer: Self-pay | Admitting: Internal Medicine

## 2013-06-29 ENCOUNTER — Other Ambulatory Visit: Payer: Self-pay | Admitting: Cardiovascular Disease

## 2013-07-20 ENCOUNTER — Ambulatory Visit (INDEPENDENT_AMBULATORY_CARE_PROVIDER_SITE_OTHER): Payer: Medicare Other | Admitting: Endocrinology

## 2013-07-20 ENCOUNTER — Encounter: Payer: Self-pay | Admitting: Endocrinology

## 2013-07-20 VITALS — BP 128/60 | HR 75 | Temp 97.3°F | Ht 64.0 in | Wt 150.2 lb

## 2013-07-20 DIAGNOSIS — I739 Peripheral vascular disease, unspecified: Secondary | ICD-10-CM

## 2013-07-20 DIAGNOSIS — E1051 Type 1 diabetes mellitus with diabetic peripheral angiopathy without gangrene: Secondary | ICD-10-CM

## 2013-07-20 DIAGNOSIS — E1059 Type 1 diabetes mellitus with other circulatory complications: Secondary | ICD-10-CM

## 2013-07-20 LAB — MICROALBUMIN / CREATININE URINE RATIO: Creatinine,U: 50.4 mg/dL

## 2013-07-20 NOTE — Progress Notes (Signed)
Subjective:    Patient ID: Emma Yu, female    DOB: [birth year], 79 y.o.   MRN: [id number]  HPI Pt returns for f/u of type 1 dm (dx'ed 1999, on a routine blood test; she has been on insulin since 2001; she has mild if any neuropathy of the lower extremities; she has associated by CAD and PAD; she has a one-touch ping insulin pump; she has declined continuous glucose monitor; she has never had severe hypoglycemia or DKA). She averages a total of approx 60 units of humalog per day, via her pump.  she brings a record of her cbg's which i have reviewed today.  Almost all are in the low-100's.  There is no trend throughout the day.  It varies from 86-200.  pt states she feels well in general.  Past Medical History  Diagnosis Date  . PAD (peripheral artery disease)     s/p bilateral comon iliac artery stenting in 2002. Known significant  R SFA  disease. carotid dz noted 11/2011 followed by Dr. Imogene Burn  . Diabetes mellitus   . Hypertension   . Hyperlipidemia   . Venous insufficiency   . Chronic diastolic heart failure     Echocardiogram 12/10/11: Moderate LVH, EF 65-70%, moderate aortic stenosis, mean gradient 20, mild MS  . Atrial fibrillation     not a coumadin candidate secondary to fall risk  . DJD (degenerative joint disease) of hip     s/p R THR 10/2010  . Aortic stenosis     Moderate by echo 4/13 - mean 20 mmHg  . Mitral stenosis     Mild by echo 4/13  . Anemia     iron deficient  . Acute respiratory failure     12/2011 admission - decreased O2 sats on ambulation, improved by time of discharge  . CAD (coronary artery disease)     minimal plaque by cath 5/12    Past Surgical History  Procedure Laterality Date  . Total hip arthroplasty  1991, 1994    redo in 1994  . Lumbar disc surgery  1999  . Cardiac catheterization  01/10/2011    No significant CAD    History   Social History  . Marital Status: Widowed    Spouse Name: N/A    Number of Children: N/A  . Years of  Education: N/A   Occupational History  .      retired   Social History Main Topics  . Smoking status: Former Smoker -- 2.00 packs/day for 25 years    Types: Cigarettes    Quit date: 08/20/1985  . Smokeless tobacco: Never Used     Comment: Retired-widowed 89, lives with son  . Alcohol Use: No  . Drug Use: No  . Sexual Activity: Not Currently   Other Topics Concern  . Not on file   Social History Narrative   Lives with son   Widowed 24   Quit smoking in 1987    Current Outpatient Prescriptions on File Prior to Visit  Medication Sig Dispense Refill  . Ascorbic Acid (VITAMIN C) 500 MG tablet Take 500 mg by mouth daily.        Marland Kitchen aspirin EC 81 MG tablet Take 81 mg by mouth 2 (two) times daily.      Marland Kitchen b complex vitamins tablet Take 1 tablet by mouth daily.        . Cholecalciferol (VITAMIN D3) 2000 UNITS capsule Take 2,000 Units by mouth daily.        Marland Kitchen  cloNIDine (CATAPRES) 0.2 MG tablet TAKE 1 TABLET BY MOUTH TWICE A DAY  60 tablet  4  . co-enzyme Q-10 50 MG capsule Take 50 mg by mouth daily.      . Dietary Management Product (VASCULERA) TABS Take 1 capsule by mouth daily.  30 tablet  11  . DIOVAN 320 MG tablet TAKE 1 TABLET EVERY DAY  90 tablet  3  . fish oil-omega-3 fatty acids 1000 MG capsule Take 2 g by mouth daily.        . furosemide (LASIX) 40 MG tablet TAKE 1 TABLET BY MOUTH EVERY DAY  30 tablet  3  . gabapentin (NEURONTIN) 300 MG capsule TAKE 2 CAPSULES BY MOUTH AT BEDTIME  180 capsule  3  . Insulin Infusion Pump Supplies (INSET INFUSION SET 23" ) MISC 1 Device by Does not apply route every 3 (three) days.      . Insulin Infusion Pump Supplies (INSULIN PUMP SYRINGE RESERVOIR) MISC 1 Device by Does not apply route every 3 (three) days. Animas 2 ml      . insulin lispro (HUMALOG) 100 UNIT/ML injection Inject 55.2 Units into the skin. For use in pump, total of 70 units per day      . ketoconazole (NIZORAL) 2 % cream Apply topically 2 (two) times daily.  60 g  1  .  metoprolol tartrate (LOPRESSOR) 25 MG tablet TAKE 1 TABLET BY MOUTH TWICE A DAY  60 tablet  9  . omeprazole (PRILOSEC) 40 MG capsule Take 40 mg by mouth daily before breakfast.       . pravastatin (PRAVACHOL) 20 MG tablet TAKE 1 TABLET BY MOUTH IN THE EVENING  90 tablet  3  . Probiotic Product (PROBIOTIC PO) Take 1 tablet by mouth daily at 10 pm.      . traMADol (ULTRAM) 50 MG tablet Take 2 tablets (100 mg total) by mouth every 6 (six) hours as needed for pain.  120 tablet  5   No current facility-administered medications on file prior to visit.    Allergies  Allergen Reactions  . Carvedilol Other (See Comments)    Heart stops  . Clindamycin/Lincomycin Other (See Comments)    tremors  . Diltiazem Hcl Other (See Comments)    Chest pain  . Fenofibrate Other (See Comments)    Unknown - [facility] - MAR  . Hctz [Hydrochlorothiazide] Other (See Comments)    Chest pain  . Nisoldipine Other (See Comments)    Chest pain  . Nitroglycerin Other (See Comments)    Unknown - [facility] MAR  . Propranolol Diarrhea    Chest pain  . Septra [Sulfamethoxazole-Tmp Ds]     Rash, cyst  . Donata Duff Fumarate] Other (See Comments)    Unknown reaction  . Valturna [Aliskiren-Valsartan] Other (See Comments)    Unknown reaction    Family History  Problem Relation Age of Onset  . Diabetes Father   . Diabetes Other     5/8 sibs    BP 128/60  Pulse 75  Temp(Src) 97.3 F (36.3 C) (Oral)  Ht 5\' 4"  (1.626 m)  Wt 150 lb 4 oz (68.153 kg)  BMI 25.78 kg/m2  SpO2 94%  Review of Systems denies hypoglycemia and weight change.      Objective:   Physical Exam VITAL SIGNS:  See vs page GENERAL: no distress  Lab Results  Component Value Date   HGBA1C 6.8* 07/20/2013      Assessment & Plan:  DM: well-controlled.  This  insulin pump regimen was chosen from multiple options, as it best matches her insulin to her changing requirements throughout the day.   CAD: in this setting, the benefits of glycemic  control must be weighed against the risks of hypoglycemia.   PAD: this limits exercise rx of DM.

## 2013-07-20 NOTE — Patient Instructions (Addendum)
A diabetes blood and urine tests requested for you today.  We'll contact you with results.  take a basal rate to 0.8 unit/hr, 24 hrs per day.   Take a bolus of 11 units with each meal.   continue correction bolus (which some people call "sensitivity," or "insulin sensitivity ratio," or just "isr") of 1 unit for each by 25 which your glucose exceeds 120. check your blood sugar 4 times a day--before the 3 meals, and at bedtime.  also check if you have symptoms of your blood sugar being too high or too low.  please keep a record of the readings and bring it to your next appointment here.  please call us sooner if you are having low blood sugar episodes. Please make a follow-up appointment in 3 months.

## 2013-08-10 ENCOUNTER — Telehealth: Payer: Self-pay | Admitting: Internal Medicine

## 2013-08-10 MED ORDER — CIPROFLOXACIN HCL 250 MG PO TABS: 250.0000 mg | ORAL_TABLET | Freq: Two times a day (BID) | ORAL | Status: DC

## 2013-08-10 NOTE — Telephone Encounter (Signed)
Try cipro 

## 2013-08-10 NOTE — Telephone Encounter (Signed)
Called patient to informed of prev note

## 2013-08-10 NOTE — Telephone Encounter (Signed)
Patient called stating she went to urgent care and was told she has a kidney infection patient stated the medicine they gave her is  Not working, patient wanted to know if she could get a prescription for something other than Nitrofurantoin Please advise what can be done

## 2013-08-20 HISTORY — PX: CATARACT EXTRACTION, BILATERAL: SHX1313

## 2013-08-21 ENCOUNTER — Ambulatory Visit: Payer: Medicare Other | Admitting: Internal Medicine

## 2013-09-15 ENCOUNTER — Ambulatory Visit (INDEPENDENT_AMBULATORY_CARE_PROVIDER_SITE_OTHER): Payer: Medicare Other | Admitting: Cardiovascular Disease

## 2013-09-15 ENCOUNTER — Encounter: Payer: Self-pay | Admitting: Cardiovascular Disease

## 2013-09-15 VITALS — BP 96/51 | HR 60 | Ht 62.0 in | Wt 144.0 lb

## 2013-09-15 DIAGNOSIS — I739 Peripheral vascular disease, unspecified: Secondary | ICD-10-CM

## 2013-09-15 NOTE — Progress Notes (Signed)
HPI  This is an 80 year old female who is here today for a follow up visit regarding peripheral arterial disease.  She has a history of mod aortic stenosis, Atrial fibrillation, diastolic heart failure, peripheral arterial disease, status post bilateral common iliac artery stenting in 2002 done by Dr. Maple Hudson with known significant right SFA disease, diabetes, hypertension, hyperlipidemia. LHC 01/10/11: Luminal irregularities.   She also had a revision of right hip surgery . She walks very slowly with a walker. [name] deemed to be not an anticoagulation candidate.  She has known history of chronic venous insufficiency with significant stasis dermatitis. Overall, she has been stable and reports no worsening claudication.    Allergies  Allergen Reactions  . Carvedilol Other (See Comments)    Heart stops  . Clindamycin/Lincomycin Other (See Comments)    tremors  . Diltiazem Hcl Other (See Comments)    Chest pain  . Fenofibrate Other (See Comments)    Unknown - [facility] - MAR  . Hctz [Hydrochlorothiazide] Other (See Comments)    Chest pain  . Nisoldipine Other (See Comments)    Chest pain  . Nitroglycerin Other (See Comments)    Unknown - [facility] MAR  . Propranolol Diarrhea    Chest pain  . Septra [Sulfamethoxazole-Tmp Ds]     Rash, cyst  . Patrici Ranks Fumarate] Other (See Comments)    Unknown reaction  . Valturna [Aliskiren-Valsartan] Other (See Comments)    Unknown reaction     Current Outpatient Prescriptions on File Prior to Visit  Medication Sig Dispense Refill  . Ascorbic Acid (VITAMIN C) 500 MG tablet Take 500 mg by mouth daily.        Marland Kitchen aspirin EC 81 MG tablet Take 81 mg by mouth 2 (two) times daily.      Marland Kitchen b complex vitamins tablet Take 1 tablet by mouth daily.        . Cholecalciferol (VITAMIN D3) 2000 UNITS capsule Take 2,000 Units by mouth daily.        . ciprofloxacin (CIPRO) 250 MG tablet Take 1 tablet (250 mg total) by mouth 2 (two) times daily.  10 tablet  1   . cloNIDine (CATAPRES) 0.2 MG tablet TAKE 1 TABLET BY MOUTH TWICE A DAY  60 tablet  4  . co-enzyme Q-10 50 MG capsule Take 50 mg by mouth daily.      . Dietary Management Product (VASCULERA) TABS Take 1 capsule by mouth daily.  30 tablet  11  . fish oil-omega-3 fatty acids 1000 MG capsule Take 2 g by mouth daily.        . furosemide (LASIX) 40 MG tablet TAKE 1 TABLET BY MOUTH EVERY DAY  30 tablet  3  . gabapentin (NEURONTIN) 300 MG capsule TAKE 2 CAPSULES BY MOUTH AT BEDTIME  180 capsule  3  . Insulin Infusion Pump Supplies (INSET INFUSION SET 23" 6MM) MISC 1 Device by Does not apply route every 3 (three) days.      . Insulin Infusion Pump Supplies (INSULIN PUMP SYRINGE RESERVOIR) MISC 1 Device by Does not apply route every 3 (three) days. Animas 2 ml      . insulin lispro (HUMALOG) 100 UNIT/ML injection Inject 55.2 Units into the skin. For use in pump, total of 70 units per day      . ketoconazole (NIZORAL) 2 % cream Apply topically 2 (two) times daily.  60 g  1  . metoprolol tartrate (LOPRESSOR) 25 MG tablet TAKE 1 TABLET  BY MOUTH TWICE A DAY  60 tablet  9  . omeprazole (PRILOSEC) 40 MG capsule Take 40 mg by mouth daily before breakfast.       . pravastatin (PRAVACHOL) 20 MG tablet TAKE 1 TABLET BY MOUTH IN THE EVENING  90 tablet  3  . Probiotic Product (PROBIOTIC PO) Take 1 tablet by mouth daily at 10 pm.      . traMADol (ULTRAM) 50 MG tablet Take 2 tablets (100 mg total) by mouth every 6 (six) hours as needed for pain.  120 tablet  5   No current facility-administered medications on file prior to visit.     Past Medical History  Diagnosis Date  . PAD (peripheral artery disease)     s/p bilateral comon iliac artery stenting in 2002. Known significant  R SFA  disease. carotid dz noted 11/2011 followed by Dr. Bridgett Larsson  . Diabetes mellitus   . Hypertension   . Hyperlipidemia   . Venous insufficiency   . Chronic diastolic heart failure     Echocardiogram 12/10/11: Moderate LVH, EF 65-70%,  moderate aortic stenosis, mean gradient 20, mild MS  . Atrial fibrillation     not a coumadin candidate secondary to fall risk  . DJD (degenerative joint disease) of hip     s/p R THR 10/2010  . Aortic stenosis     Moderate by echo 4/13 - mean 20 mmHg  . Mitral stenosis     Mild by echo 4/13  . Anemia     iron deficient  . Acute respiratory failure     12/2011 admission - decreased O2 sats on ambulation, improved by time of discharge  . CAD (coronary artery disease)     minimal plaque by cath 5/12     Past Surgical History  Procedure Laterality Date  . Total hip arthroplasty  1991, 1994    redo in 1994  . Lumbar disc surgery  1999  . Cardiac catheterization  01/10/2011    No significant CAD     Family History  Problem Relation Age of Onset  . Diabetes Father   . Diabetes Other     5/8 sibs     History   Social History  . Marital Status: Widowed    Spouse Name: N/A    Number of Children: N/A  . Years of Education: N/A   Occupational History  .      retired   Social History Main Topics  . Smoking status: Former Smoker -- 2.00 packs/day for 25 years    Types: Cigarettes    Quit date: 08/20/1985  . Smokeless tobacco: Never Used     Comment: Retired-widowed 77, lives with son  . Alcohol Use: No  . Drug Use: No  . Sexual Activity: Not Currently   Other Topics Concern  . Not on file   Social History Narrative   Lives with son   Widowed 30   Quit smoking in [place]   BP 96/51  Pulse 60  Ht 5\' 2"  (1.575 m)  Wt 144 lb (65.318 kg)  BMI 26.33 kg/m2 Constitutional: [name] oriented to person, place, and time. She appears well-developed and well-nourished. No distress.  HENT: No nasal discharge.  Head: Normocephalic and atraumatic.  Eyes: Pupils are equal and round. Right eye exhibits no discharge. Left eye exhibits no discharge.  Neck: Normal range of motion. Neck supple. No JVD present. No thyromegaly present.  Cardiovascular: Normal  rate, irregular rhythm,  normal heart sounds. Exam reveals no gallop and no friction rub. 2/6 systolic ejection murmur at the aortic area.  Pulmonary/Chest: Effort normal and breath sounds normal. No stridor. No respiratory distress. She has no wheezes. She has no rales. She exhibits no tenderness.  Abdominal: Soft. Bowel sounds are normal. She exhibits no distension. There is no tenderness. There is no rebound and no guarding.  Musculoskeletal: Normal range of motion. She exhibits trace edema and no tenderness.  Neurological: [name] alert and oriented to person, place, and time. Coordination normal.  Skin: Skin is warm and dry. Chronic stasis dermatitis with hyperpigmentation. [name] not diaphoretic.  Psychiatric: She has a normal mood and affect. Her behavior is normal. Judgment and thought content normal.   EKG: A-fib, LAD, septal infarct  ASSESSMENT AND PLAN

## 2013-09-15 NOTE — Patient Instructions (Addendum)
Your physician recommends that you schedule a follow-up appointment  As needed with Dr. Fletcher [name]  Your physician has requested that you have an aorto-iliac duplex. During this test, an ultrasound is used to evaluate the aorta. Allow 30 minutes for this exam. Do not eat after midnight the day before and avoid carbonated beverages   Your physician has requested that you have a lower   extremity arterial duplex. This test is an ultrasound of the arteries in the legs or arms. It looks at arterial blood flow in the legs and arms. Allow one hour for Lower and Upper Arterial scans. There are no restrictions or special instructions

## 2013-09-16 ENCOUNTER — Encounter: Payer: Self-pay | Admitting: Cardiovascular Disease

## 2013-09-16 NOTE — Assessment & Plan Note (Signed)
She has known history of previous iliac artery stenting with right SFA disease. It is difficult to exactly know how symptomatic she is given her overall poor overall functional capacity. Definitely there is no signs of critical limb ischemia. I will request a followup aortoiliac duplex lower extremity Doppler. She can likely followup with me as needed based on her symptoms.

## 2013-09-22 ENCOUNTER — Telehealth: Payer: Self-pay | Admitting: Cardiovascular Disease

## 2013-09-22 NOTE — Telephone Encounter (Signed)
New Problem:  Pt is wanting to know if she can take her BP meds and her heart meds before her Aorta test tomorrow. Pt would like a call back from the nurse.

## 2013-09-22 NOTE — Telephone Encounter (Signed)
Told pt she can take all of her medications like normal tomorrow am with a small amt of water.  And reinforced nothing to eat/drink after midnight tonight.   Pt verbalizes understanding.

## 2013-09-23 ENCOUNTER — Ambulatory Visit (HOSPITAL_BASED_OUTPATIENT_CLINIC_OR_DEPARTMENT_OTHER): Payer: Medicare Other

## 2013-09-23 ENCOUNTER — Ambulatory Visit (HOSPITAL_COMMUNITY): Payer: Medicare Other | Attending: Cardiology

## 2013-09-23 DIAGNOSIS — I700 Atherosclerosis of aorta: Secondary | ICD-10-CM

## 2013-09-23 DIAGNOSIS — E119 Type 2 diabetes mellitus without complications: Secondary | ICD-10-CM | POA: Insufficient documentation

## 2013-09-23 DIAGNOSIS — I739 Peripheral vascular disease, unspecified: Secondary | ICD-10-CM

## 2013-09-23 DIAGNOSIS — Z87891 Personal history of nicotine dependence: Secondary | ICD-10-CM | POA: Insufficient documentation

## 2013-09-23 DIAGNOSIS — I2510 Atherosclerotic heart disease of native coronary artery without angina pectoris: Secondary | ICD-10-CM | POA: Insufficient documentation

## 2013-09-23 DIAGNOSIS — I708 Atherosclerosis of other arteries: Secondary | ICD-10-CM | POA: Insufficient documentation

## 2013-09-23 DIAGNOSIS — I10 Essential (primary) hypertension: Secondary | ICD-10-CM | POA: Insufficient documentation

## 2013-09-23 DIAGNOSIS — E785 Hyperlipidemia, unspecified: Secondary | ICD-10-CM | POA: Insufficient documentation

## 2013-09-29 ENCOUNTER — Emergency Department (HOSPITAL_COMMUNITY)
Admission: EM | Admit: 2013-09-29 | Discharge: 2013-09-29 | Disposition: A | Discharge status: Home / Self Care | Payer: Medicare Other | Source: Home / Self Care | Attending: Family Medicine | Admitting: Family Medicine

## 2013-09-29 ENCOUNTER — Encounter (HOSPITAL_COMMUNITY): Payer: Self-pay | Admitting: Emergency Medicine

## 2013-09-29 ENCOUNTER — Telehealth: Payer: Self-pay | Admitting: Internal Medicine

## 2013-09-29 DIAGNOSIS — I498 Other specified cardiac arrhythmias: Secondary | ICD-10-CM

## 2013-09-29 DIAGNOSIS — R001 Bradycardia, unspecified: Secondary | ICD-10-CM

## 2013-09-29 NOTE — ED Provider Notes (Signed)
Emma Yu is a 80 y.o. female who presents to Urgent Care today for bradycardia. Patient is completely asymptomatic. She denies any weakness or numbness or dizziness fatigue or syncope. She tested her heart rate this morning and found it to be in the mid 50s. She took her p.m. dose of metoprolol earlier as she thought maybe that would increase her heart rate. She notes that her heart rate has been slower since then. She called her primary care provider who recommended she presented to [facility] urgent care for evaluation and management. She denies any chest pain palpitations or dizziness.   Past Medical History  Diagnosis Date  . PAD (peripheral artery disease)     s/p bilateral comon iliac artery stenting in 2002. Known significant  R SFA  disease. carotid dz noted 11/2011 followed by Dr. Bridgett Larsson  . Diabetes mellitus   . Hypertension   . Hyperlipidemia   . Venous insufficiency   . Chronic diastolic heart failure     Echocardiogram 12/10/11: Moderate LVH, EF 65-70%, moderate aortic stenosis, mean gradient 20, mild MS  . Atrial fibrillation     not a coumadin candidate secondary to fall risk  . DJD (degenerative joint disease) of hip     s/p R THR 10/2010  . Aortic stenosis     Moderate by echo 4/13 - mean 20 mmHg  . Mitral stenosis     Mild by echo 4/13  . Anemia     iron deficient  . Acute respiratory failure     12/2011 admission - decreased O2 sats on ambulation, improved by time of discharge  . CAD (coronary artery disease)     minimal plaque by cath 5/12   History  Substance Use Topics  . Smoking status: Former Smoker -- 2.00 packs/day for 25 years    Types: Cigarettes    Quit date: 08/20/1985  . Smokeless tobacco: Never Used     Comment: Retired-widowed 37, lives with son  . Alcohol Use: No   ROS as above Medications: No current facility-administered medications for this encounter.   Current Outpatient Prescriptions  Medication Sig Dispense Refill  . Ascorbic Acid  (VITAMIN C) 500 MG tablet Take 500 mg by mouth daily.        Marland Kitchen aspirin EC 81 MG tablet Take 81 mg by mouth 2 (two) times daily.      Marland Kitchen b complex vitamins tablet Take 1 tablet by mouth daily.        . Cholecalciferol (VITAMIN D3) 2000 UNITS capsule Take 2,000 Units by mouth daily.        . cloNIDine (CATAPRES) 0.2 MG tablet TAKE 1 TABLET BY MOUTH TWICE A DAY  60 tablet  4  . Dietary Management Product (VASCULERA) TABS Take 1 capsule by mouth daily.  30 tablet  11  . fish oil-omega-3 fatty acids 1000 MG capsule Take 2 g by mouth daily.        . furosemide (LASIX) 40 MG tablet TAKE 1 TABLET BY MOUTH EVERY DAY  30 tablet  3  . gabapentin (NEURONTIN) 300 MG capsule TAKE 2 CAPSULES BY MOUTH AT BEDTIME  180 capsule  3  . Insulin Infusion Pump Supplies (INSET INFUSION SET 23" 6MM) MISC 1 Device by Does not apply route every 3 (three) days.      . Insulin Infusion Pump Supplies (INSULIN PUMP SYRINGE RESERVOIR) MISC 1 Device by Does not apply route every 3 (three) days. Animas 2 ml      .  insulin lispro (HUMALOG) 100 UNIT/ML injection Inject 55.2 Units into the skin. For use in pump, total of 70 units per day      . ketoconazole (NIZORAL) 2 % cream Apply topically 2 (two) times daily.  60 g  1  . metoprolol tartrate (LOPRESSOR) 25 MG tablet TAKE 1 TABLET BY MOUTH TWICE A DAY  60 tablet  9  . omeprazole (PRILOSEC) 40 MG capsule Take 40 mg by mouth daily before breakfast.       . pravastatin (PRAVACHOL) 20 MG tablet TAKE 1 TABLET BY MOUTH IN THE EVENING  90 tablet  3  . Probiotic Product (PROBIOTIC PO) Take 1 tablet by mouth daily at 10 pm.      . traMADol (ULTRAM) 50 MG tablet Take 2 tablets (100 mg total) by mouth every 6 (six) hours as needed for pain.  120 tablet  5  . valsartan (DIOVAN) 320 MG tablet Take 320 mg by mouth daily.      . ciprofloxacin (CIPRO) 250 MG tablet Take 1 tablet (250 mg total) by mouth 2 (two) times daily.  10 tablet  1  . co-enzyme Q-10 50 MG capsule Take 50 mg by mouth daily.         Exam:  BP 155/49  Pulse 68  Temp(Src) 97.5 F (36.4 C) (Oral)  Resp 16  SpO2 99% Gen: Well NAD HEENT: EOMI,  MMM Lungs: Normal work of breathing. CTABL Heart: Bradycardia with a rate in the 50s  no MRG Abd: NABS, Soft. NT, ND Exts: Brisk capillary refill, warm and well perfused.   Twelve-lead EKG shows atrial fibrillation with a ventricular rate in the 50s. No new changes from prior several EKGs with the exception of the rate.  No results found for this or any previous visit (from the past 24 hour(s)). No results found.  Assessment and Plan: 80 y.o. female with atrial fibrillation with decreased ventricular rate. This is secondary to medication effect. Patient is currently asymptomatic therefore feel that she is safe for discharge home. Recommended she not take a dose of metoprolol this evening and she already took it. She should call her cardiologist in the morning.   she lives with her son who can take care of her tonight.   Discussed warning signs or symptoms. Please see discharge instructions. Patient  and son expresses understanding.    Gregor Hams, MD 09/29/13 2006

## 2013-09-29 NOTE — Telephone Encounter (Signed)
Most likely related to the medication, and if not feeling any different, there is no need for office visit, as this means the medication is working   Would need OV for weakness, dizzy, sob, CP, falls or other unusual symptoms  Please call Dr Ronnald Ramp with HR and BP in a 2-3 days if possible for update

## 2013-09-29 NOTE — Discharge Instructions (Signed)
Thank you for coming in today. Do not take the metoporal this evening.  Call you cardiologist office tomorrow morning.  Go to the ER if you feel like you will faint.  Call or go to the emergency room if you get worse, have trouble breathing, have chest pains, or palpitations.   Bradycardia Bradycardia is a term for a heart rate (pulse) that, in adults, is slower than 60 beats per minute. A normal rate is 60 to 100 beats per minute. A heart rate below 60 beats per minute may be normal for some adults with healthy hearts. If the rate is too slow, the heart may have trouble pumping the volume of blood the body needs. If the heart rate gets too low, blood flow to the brain may be decreased and may make you feel lightheaded, dizzy, or faint. The heart has a natural pacemaker in the top of the heart called the SA node (sinoatrial or sinus node). This pacemaker sends out regular electrical signals to the muscle of the heart, telling the heart muscle when to beat (contract). The electrical signal travels from the upper parts of the heart (atria) through the AV node (atrioventricular node), to the lower chambers of the heart (ventricles). The ventricles squeeze, pumping the blood from your heart to your lungs and to the rest of your body. CAUSES   Problem with the heart's electrical system.  Problem with the heart's natural pacemaker.  Heart disease, damage, or infection.  Medications.  Problems with minerals and salts (electrolytes). SYMPTOMS   Fainting (syncope).  Fatigue and weakness.  Shortness of breath (dyspnea).  Chest pain (angina).  Drowsiness.  Confusion. DIAGNOSIS   An electrocardiogram (ECG) can help your caregiver determine the type of slow heart rate you have.  If the cause is not seen on an ECG, you may need to wear a heart monitor that records your heart rhythm for several hours or days.  Blood tests. TREATMENT   Electrolyte supplements.  Medications.  Withholding  medication which is causing a slow heart rate.  Pacemaker placement. SEEK IMMEDIATE MEDICAL CARE IF:   You feel lightheaded or faint.  You develop an irregular heart rate.  You feel chest pain or have trouble breathing. MAKE SURE YOU:   Understand these instructions.  Will watch your condition.  Will get help right away if you are not doing well or get worse. Document Released: 04/28/2002 Document Revised: 10/29/2011 Document Reviewed: 03/24/2008 [facility] Patient Information 2014 [place].

## 2013-09-29 NOTE — ED Notes (Addendum)
C/o low heart rate at home of 45.  Pt.'s heart rate here is 50.  She took her Metoprolol @  Breakfast and 12 N. Pt. instructed to take it 2nd dose in the evening with dinner.

## 2013-09-29 NOTE — Telephone Encounter (Signed)
Patient Information:  Caller Name: Ladelle  Phone: [phone]  Patient: Emma Yu, Emma Yu  Gender: Female  DOB: 1926/09/09  Age: 80 Years  PCP: [name] (Adults only)  Office Follow Up:  Does the office need to follow up with this patient?: Yes  Instructions For The Office: Please review, contact patient about office Appt or alternate care. Son gets home at 4:30 pm and would be able to bring her. Can reach patient at  [phone].   Symptoms  Reason For Call & Symptoms: Patient spoke on phone for awhile - noted she sometimes did not think of what to say and sentence was finished by friend -  then came into kitchen to have lunch.  Took BP and pulse, found heart rate at 52 at 1:20 pm  so took her evening Metoprolol.  Does not know how to take a pulse so getting it from BP machine.  Heart rate up to 59 by 2 pm and now down to 50 again.  Denies chest pain or difficulty breathing, feels as usual, moving about on walker.  Thinks heartrate usually 68-76.  FBS today 148, glucose at lunch 201.  Never had slow heart rate before.  Reviewed Health History In EMR: Yes  Reviewed Medications In EMR: Yes  Reviewed Allergies In EMR: Yes  Reviewed Surgeries / Procedures: Yes  Date of Onset of Symptoms: 09/29/2013  Guideline(s) Used:  Heart Rate and Heartbeat Questions  Disposition Per Guideline:   See Today in Office  Reason For Disposition Reached:   Age > 60 years  Advice Given:  Avoid Caffeine  Avoid caffeine-containing beverages (Reason: caffeine is a stimulant and can aggravate palpitations).  Call Back If:  Chest pain, lightheadedness, or difficulty breathing occurs  You become worse.  Patient Will Follow Care Advice:  YES

## 2013-09-29 NOTE — Telephone Encounter (Signed)
Please advise in Dr Ronnald Ramp absence

## 2013-09-30 NOTE — Telephone Encounter (Signed)
New message     Seen in [facility] on last night  Heart rate below 60 - dx bradycardia . They advise patient not to speak with her cardiogist this am .  Metoprolol trartrate  25 mg.  Please call cell phone first.

## 2013-09-30 NOTE — Telephone Encounter (Signed)
Pt called because she said yesterday evening her HR was bellow 60 beats/minute no symptoms so she went to [facility] there she was told not to take the Metoprolol until she talk with her cardiologist. Pt's BP now is 162/87 heart rate is 85 beats/minute. Pt takes metoprolol tartrate 25 mg twice a day. Pt is aware to take the metoprolol now and to start the scheduled dose as usual in the AM. Pt is aware also if her heart rate is 58 to 60 beats/ minute and has no symptoms she can take the medication, and if she has doubts to call the office if needed. Pt verbalized understanding.

## 2013-09-30 NOTE — Telephone Encounter (Signed)
Called the patient informed of MD instructions. 

## 2013-10-09 ENCOUNTER — Encounter: Payer: Self-pay | Admitting: Cardiovascular Disease

## 2013-10-19 ENCOUNTER — Other Ambulatory Visit: Payer: Self-pay | Admitting: Internal Medicine

## 2013-10-20 ENCOUNTER — Other Ambulatory Visit: Payer: Self-pay | Admitting: Internal Medicine

## 2013-10-28 ENCOUNTER — Other Ambulatory Visit: Payer: Self-pay | Admitting: Internal Medicine

## 2013-11-16 ENCOUNTER — Other Ambulatory Visit: Payer: Self-pay | Admitting: Internal Medicine

## 2013-12-08 ENCOUNTER — Other Ambulatory Visit: Payer: Self-pay | Admitting: Internal Medicine

## 2013-12-17 ENCOUNTER — Ambulatory Visit: Payer: Medicare Other | Admitting: Internal Medicine

## 2013-12-17 ENCOUNTER — Encounter: Payer: Self-pay | Admitting: Endocrinology

## 2013-12-17 ENCOUNTER — Ambulatory Visit (INDEPENDENT_AMBULATORY_CARE_PROVIDER_SITE_OTHER): Payer: Medicare Other | Admitting: Endocrinology

## 2013-12-17 VITALS — BP 126/82 | HR 77 | Temp 97.7°F | Ht 62.0 in | Wt 152.0 lb

## 2013-12-17 DIAGNOSIS — E1051 Type 1 diabetes mellitus with diabetic peripheral angiopathy without gangrene: Secondary | ICD-10-CM

## 2013-12-17 DIAGNOSIS — E1059 Type 1 diabetes mellitus with other circulatory complications: Secondary | ICD-10-CM

## 2013-12-17 DIAGNOSIS — I798 Other disorders of arteries, arterioles and capillaries in diseases classified elsewhere: Secondary | ICD-10-CM

## 2013-12-17 LAB — HEMOGLOBIN A1C: HEMOGLOBIN A1C: 7 % — AB (ref 4.6–6.5)

## 2013-12-17 NOTE — Progress Notes (Signed)
Subjective:    Patient ID: Emma Yu, female    DOB: April 01, 1927, 80 y.o.   MRN: [id number]  HPI Pt returns for f/u of type 1 dm (dx'ed 1999, on a routine blood test; she has been on insulin since 2001; she has mild if any neuropathy of the lower extremities; she has associated by CAD, nephropathy, and PAD; she has a one-touch ping insulin pump; she has declined continuous glucose monitor; she has never had severe hypoglycemia or DKA). She averages a total of approx 60 units of humalog per day, via her pump.  she brings a record of her cbg's which i have reviewed today.  It varies from 100-300, but most are in the 100's.  It is in general higher as the day goes on.  Pt says cbg's are seldom low, and these episodes are mild.   Past Medical History  Diagnosis Date  . PAD (peripheral artery disease)     s/p bilateral comon iliac artery stenting in 2002. Known significant  R SFA  disease. carotid dz noted 11/2011 followed by Dr. Bridgett Larsson  . Diabetes mellitus   . Hypertension   . Hyperlipidemia   . Venous insufficiency   . Chronic diastolic heart failure     Echocardiogram 12/10/11: Moderate LVH, EF 65-70%, moderate aortic stenosis, mean gradient 20, mild MS  . Atrial fibrillation     not a coumadin candidate secondary to fall risk  . DJD (degenerative joint disease) of hip     s/p R THR 10/2010  . Aortic stenosis     Moderate by echo 4/13 - mean 20 mmHg  . Mitral stenosis     Mild by echo 4/13  . Anemia     iron deficient  . Acute respiratory failure     12/2011 admission - decreased O2 sats on ambulation, improved by time of discharge  . CAD (coronary artery disease)     minimal plaque by cath 5/12    Past Surgical History  Procedure Laterality Date  . Total hip arthroplasty  1991, 1994    redo in 1994  . Lumbar disc surgery  1999  . Cardiac catheterization  01/10/2011    No significant CAD  . Cholecystectomy    . Abdominal hysterectomy      History   Social History  .  Marital Status: Widowed    Spouse Name: N/A    Number of Children: N/A  . Years of Education: N/A   Occupational History  .      retired   Social History Main Topics  . Smoking status: Former Smoker -- 2.00 packs/day for 25 years    Types: Cigarettes    Quit date: 08/20/1985  . Smokeless tobacco: Never Used     Comment: Retired-widowed 41, lives with son  . Alcohol Use: No  . Drug Use: No  . Sexual Activity: Not Currently    Birth Control/ Protection: Surgical   Other Topics Concern  . Not on file   Social History Narrative   Lives with son   Widowed 16   Quit smoking in 1987    Current Outpatient Prescriptions on File Prior to Visit  Medication Sig Dispense Refill  . Ascorbic Acid (VITAMIN C) 500 MG tablet Take 500 mg by mouth daily.        Marland Kitchen aspirin EC 81 MG tablet Take 81 mg by mouth 2 (two) times daily.      Marland Kitchen b complex vitamins tablet Take 1 tablet by  mouth daily.        . Cholecalciferol (VITAMIN D3) 2000 UNITS capsule Take 2,000 Units by mouth daily.        . ciprofloxacin (CIPRO) 250 MG tablet Take 1 tablet (250 mg total) by mouth 2 (two) times daily.  10 tablet  1  . cloNIDine (CATAPRES) 0.2 MG tablet TAKE 1 TABLET BY MOUTH TWICE A DAY  60 tablet  11  . co-enzyme Q-10 50 MG capsule Take 50 mg by mouth daily.      . Dietary Management Product (VASCULERA) TABS TAKE 1 TABLET DAILY  90 tablet  3  . fish oil-omega-3 fatty acids 1000 MG capsule Take 2 g by mouth daily.        . furosemide (LASIX) 40 MG tablet TAKE 1 TABLET BY MOUTH EVERY DAY  30 tablet  0  . furosemide (LASIX) 40 MG tablet TAKE 1 TABLET BY MOUTH EVERY DAY  30 tablet  0  . gabapentin (NEURONTIN) 300 MG capsule TAKE 2 CAPSULES BY MOUTH AT BEDTIME  180 capsule  3  . Insulin Infusion Pump Supplies (INSET INFUSION SET 23" 6MM) MISC 1 Device by Does not apply route every 3 (three) days.      . Insulin Infusion Pump Supplies (INSULIN PUMP SYRINGE RESERVOIR) MISC 1 Device by Does not apply route every 3  (three) days. Animas 2 ml      . insulin lispro (HUMALOG) 100 UNIT/ML injection Inject 55.2 Units into the skin. For use in pump, total of 70 units per day      . ketoconazole (NIZORAL) 2 % cream Apply topically 2 (two) times daily.  60 g  1  . metoprolol tartrate (LOPRESSOR) 25 MG tablet TAKE 1 TABLET BY MOUTH TWICE A DAY  60 tablet  9  . omeprazole (PRILOSEC) 40 MG capsule Take 40 mg by mouth daily before breakfast.       . pravastatin (PRAVACHOL) 20 MG tablet TAKE 1 TABLET BY MOUTH IN THE EVENING  90 tablet  3  . pravastatin (PRAVACHOL) 20 MG tablet TAKE 1 TABLET BY MOUTH IN THE EVENING  30 tablet  11  . Probiotic Product (PROBIOTIC PO) Take 1 tablet by mouth daily at 10 pm.      . traMADol (ULTRAM) 50 MG tablet Take 2 tablets (100 mg total) by mouth every 6 (six) hours as needed for pain.  120 tablet  5  . valsartan (DIOVAN) 320 MG tablet Take 320 mg by mouth daily.      . valsartan (DIOVAN) 320 MG tablet TAKE 1 TABLET BY MOUTH AT BEDTIME  90 tablet  2   No current facility-administered medications on file prior to visit.    Allergies  Allergen Reactions  . Carvedilol Other (See Comments)    Heart stops  . Clindamycin/Lincomycin Other (See Comments)    tremors  . Diltiazem Hcl Other (See Comments)    Chest pain  . Fenofibrate Other (See Comments)    Unknown - [place] - MAR  . Hctz [Hydrochlorothiazide] Other (See Comments)    Chest pain  . Nisoldipine Other (See Comments)    Chest pain  . Nitroglycerin Other (See Comments)    Unknown - [place] MAR  . Propranolol Diarrhea    Chest pain  . Septra [Sulfamethoxazole-Tmp Ds]     Rash, cyst  . Patrici Ranks Fumarate] Other (See Comments)    Unknown reaction  . Valturna [Aliskiren-Valsartan] Other (See Comments)    Unknown reaction    Family  History  Problem Relation Age of Onset  . Diabetes Father   . Diabetes Other     5/8 sibs    BP 126/82  Pulse 77  Temp(Src) 97.7 F (36.5 C) (Oral)  Ht 5\' 2"  (1.575 m)  Wt 152 lb  (68.947 kg)  BMI 27.79 kg/m2  SpO2 92%  Review of Systems She has bilat pain at the plantar aspect of the heels.  Denies LOC.      Objective:   Physical Exam VITAL SIGNS:  See vs page GENERAL: no distress       Assessment & Plan:  Type 1 DM: well-controlled Heel pain: I offered x-rays, advice on heel pads, or ref to podiatry.

## 2013-12-17 NOTE — Patient Instructions (Addendum)
A diabetes blood test is requested for you today.  We'll contact you with results.  take a basal rate of 0.8 unit/hr, 24 hrs per day.   Take a bolus of 11 units with each meal.   continue correction bolus (which some people call "sensitivity," or "insulin sensitivity ratio," or just "isr") of 1 unit for each by 25 which your glucose exceeds 120. check your blood sugar 4 times a day--before the 3 meals, and at bedtime.  also check if you have symptoms of your blood sugar being too high or too low.  please keep a record of the readings and bring it to your next appointment here.  please call us sooner if you are having low blood sugar episodes. Please make a follow-up appointment in 3 months.   

## 2013-12-18 ENCOUNTER — Telehealth: Payer: Self-pay | Admitting: Endocrinology

## 2013-12-18 MED ORDER — INSULIN LISPRO 100 UNIT/ML ~~LOC~~ SOLN: SUBCUTANEOUS | Status: DC

## 2013-12-18 NOTE — Telephone Encounter (Signed)
Pt wanted Novolog samples and I stated we do not carry samples  She would like to know what the mail order is to receive her rx cheaper  Please advise patient    Thank You :)

## 2013-12-18 NOTE — Telephone Encounter (Signed)
Rx sent, patient informed.

## 2013-12-21 ENCOUNTER — Ambulatory Visit (INDEPENDENT_AMBULATORY_CARE_PROVIDER_SITE_OTHER): Payer: Medicare Other | Admitting: Internal Medicine

## 2013-12-21 ENCOUNTER — Encounter: Payer: Self-pay | Admitting: Internal Medicine

## 2013-12-21 ENCOUNTER — Other Ambulatory Visit (INDEPENDENT_AMBULATORY_CARE_PROVIDER_SITE_OTHER): Payer: Medicare Other

## 2013-12-21 VITALS — BP 136/82 | HR 90 | Temp 97.8°F | Resp 18 | Wt 152.0 lb

## 2013-12-21 DIAGNOSIS — N183 Chronic kidney disease, stage 3 unspecified: Secondary | ICD-10-CM

## 2013-12-21 DIAGNOSIS — N390 Urinary tract infection, site not specified: Secondary | ICD-10-CM

## 2013-12-21 DIAGNOSIS — E1142 Type 2 diabetes mellitus with diabetic polyneuropathy: Secondary | ICD-10-CM

## 2013-12-21 DIAGNOSIS — E785 Hyperlipidemia, unspecified: Secondary | ICD-10-CM

## 2013-12-21 DIAGNOSIS — E1059 Type 1 diabetes mellitus with other circulatory complications: Secondary | ICD-10-CM

## 2013-12-21 DIAGNOSIS — E1051 Type 1 diabetes mellitus with diabetic peripheral angiopathy without gangrene: Secondary | ICD-10-CM

## 2013-12-21 DIAGNOSIS — I10 Essential (primary) hypertension: Secondary | ICD-10-CM

## 2013-12-21 DIAGNOSIS — R350 Frequency of micturition: Secondary | ICD-10-CM

## 2013-12-21 DIAGNOSIS — I798 Other disorders of arteries, arterioles and capillaries in diseases classified elsewhere: Secondary | ICD-10-CM

## 2013-12-21 DIAGNOSIS — E1149 Type 2 diabetes mellitus with other diabetic neurological complication: Secondary | ICD-10-CM

## 2013-12-21 LAB — URINALYSIS, ROUTINE W REFLEX MICROSCOPIC
BILIRUBIN URINE: NEGATIVE
Ketones, ur: NEGATIVE
Nitrite: POSITIVE — AB
Specific Gravity, Urine: 1.01 (ref 1.000–1.030)
Total Protein, Urine: NEGATIVE
URINE GLUCOSE: NEGATIVE
Urobilinogen, UA: 0.2 (ref 0.0–1.0)
pH: 6 (ref 5.0–8.0)

## 2013-12-21 LAB — BASIC METABOLIC PANEL
BUN: 29 mg/dL — ABNORMAL HIGH (ref 6–23)
CO2: 33 meq/L — AB (ref 19–32)
CREATININE: 1.3 mg/dL — AB (ref 0.4–1.2)
Calcium: 9.8 mg/dL (ref 8.4–10.5)
Chloride: 103 mEq/L (ref 96–112)
GFR: 42.94 mL/min — AB (ref >60.00)
GLUCOSE: 74 mg/dL (ref 70–99)
Potassium: 4.4 mEq/L (ref 3.5–5.1)
Sodium: 142 mEq/L (ref 135–145)

## 2013-12-21 LAB — LIPID PANEL
CHOL/HDL RATIO: 2
Cholesterol: 109 mg/dL (ref 0–200)
HDL: 50.4 mg/dL (ref >39.00)
LDL Cholesterol: 29 mg/dL (ref 0–99)
TRIGLYCERIDES: 146 mg/dL (ref 0.0–149.0)
VLDL: 29.2 mg/dL (ref 0.0–40.0)

## 2013-12-21 MED ORDER — PREGABALIN 50 MG PO CAPS: 50.0000 mg | ORAL_CAPSULE | Freq: Three times a day (TID) | ORAL | Status: DC

## 2013-12-21 NOTE — Patient Instructions (Signed)

## 2013-12-21 NOTE — Progress Notes (Signed)
Subjective:    Patient ID: Emma Yu, female    DOB: 01-Jan-1927, 80 y.o.   MRN: [id number]  Hypertension This is a chronic problem. The current episode started more than 1 year ago. The problem has been gradually improving since onset. The problem is controlled. Pertinent negatives include no anxiety, blurred vision, chest pain, headaches, malaise/fatigue, neck pain, orthopnea, palpitations, peripheral edema, PND, shortness of breath or sweats. Past treatments include angiotensin blockers, diuretics, central alpha agonists and beta blockers. The current treatment provides moderate improvement. There are no compliance problems.  Hypertensive end-organ damage includes kidney disease and CAD/MI. Identifiable causes of hypertension include chronic renal disease.      Review of Systems  Constitutional: Negative.  Negative for fever, chills, malaise/fatigue, diaphoresis, appetite change and fatigue.  HENT: Negative.   Eyes: Negative.  Negative for blurred vision.  Respiratory: Negative.  Negative for apnea, cough, choking, chest tightness, shortness of breath, wheezing and stridor.   Cardiovascular: Negative.  Negative for chest pain, palpitations, orthopnea, leg swelling and PND.  Gastrointestinal: Negative.  Negative for abdominal pain.  Endocrine: Negative.   Genitourinary: Positive for frequency. Negative for dysuria, urgency, hematuria, flank pain, decreased urine volume, enuresis, difficulty urinating, vaginal pain and dyspareunia.  Musculoskeletal: Negative.  Negative for arthralgias, back pain, joint swelling, myalgias, neck pain and neck stiffness.       She has bilateral foot pain at night while she is trying to sleep, that pain is burning and stinging, neurontin has helped some but she still has pain.  Skin: Negative.   Allergic/Immunologic: Negative.   Neurological: Negative.  Negative for dizziness and headaches.  Hematological: Negative.  Negative for adenopathy. Does not  bruise/bleed easily.  Psychiatric/Behavioral: Negative.        Objective:   Physical Exam  Vitals reviewed. Constitutional: [name] oriented to person, place, and time. She appears well-developed and well-nourished. No distress.  HENT:  Head: Normocephalic and atraumatic.  Mouth/Throat: Oropharynx is clear and moist. No oropharyngeal exudate.  Eyes: Conjunctivae are normal. Right eye exhibits no discharge. Left eye exhibits no discharge. No scleral icterus.  Neck: Normal range of motion. Neck supple. No JVD present. No tracheal deviation present. No thyromegaly present.  Cardiovascular: Normal rate, regular rhythm and intact distal pulses.  Exam reveals no gallop and no friction rub.   Murmur heard.  Decrescendo systolic murmur is present with a grade of 1/6   No diastolic murmur is present  Pulses:      Carotid pulses are 1+ on the right side, and 1+ on the left side.      Radial pulses are 1+ on the right side, and 1+ on the left side.       Femoral pulses are 1+ on the right side, and 1+ on the left side.      Popliteal pulses are 1+ on the right side, and 1+ on the left side.       Dorsalis pedis pulses are 1+ on the right side, and 1+ on the left side.       Posterior tibial pulses are 1+ on the right side, and 1+ on the left side.  Pulmonary/Chest: Effort normal and breath sounds normal. No stridor. No respiratory distress. She has no wheezes. She has no rales. She exhibits no tenderness.  Abdominal: Soft. Bowel sounds are normal. She exhibits no distension and no mass. There is no tenderness. There is no rebound and no guarding.  Musculoskeletal: Normal range of motion. She exhibits  edema (1+ pitting edema in BLE). She exhibits no tenderness.  Lymphadenopathy:    She has no cervical adenopathy.  Neurological: [name] oriented to person, place, and time.  Skin: Skin is warm and dry. No rash noted. [name] not diaphoretic. No erythema. No pallor.  Psychiatric: She has a normal mood  and affect. Her behavior is normal. Judgment and thought content normal.     Lab Results  Component Value Date   WBC 6.5 01/24/2012   HGB 12.7 01/24/2012   HCT 41.0 01/24/2012   PLT 186 01/24/2012   GLUCOSE 139* 02/16/2013   CHOL 113 02/16/2013   TRIG 142.0 02/16/2013   HDL 47.40 02/16/2013   LDLCALC 37 02/16/2013   ALT 9 12/10/2011   AST 14 12/10/2011   NA 141 02/16/2013   K 5.0 02/16/2013   CL 102 02/16/2013   CREATININE 1.7* 02/16/2013   BUN 42* 02/16/2013   CO2 27 02/16/2013   TSH 2.69 02/16/2013   INR 1.18 12/25/2011   HGBA1C 7.0* 12/17/2013   MICROALBUR 2.2* 07/20/2013       Assessment & Plan:

## 2013-12-21 NOTE — Progress Notes (Signed)
Pre-visit discussion using our clinic review tool, as applicable. No additional management support is needed unless otherwise documented below in the visit note.  

## 2013-12-22 ENCOUNTER — Other Ambulatory Visit: Payer: Self-pay | Admitting: Internal Medicine

## 2013-12-22 NOTE — Assessment & Plan Note (Signed)
She has a paucity of symptoms so I did not prescribe antibiotics today I will recheck her UA and urine culture

## 2013-12-22 NOTE — Assessment & Plan Note (Signed)
I think lyrica will control her pain better than neurontin

## 2013-12-22 NOTE — Assessment & Plan Note (Signed)
Her renal function has improved some

## 2013-12-22 NOTE — Assessment & Plan Note (Signed)
The last A1C shows that her blood sugar is well controlled

## 2013-12-22 NOTE — Assessment & Plan Note (Signed)
I will recheck her UA today 

## 2013-12-22 NOTE — Assessment & Plan Note (Signed)
Her BP is well controlled 

## 2013-12-23 ENCOUNTER — Encounter: Payer: Self-pay | Admitting: Internal Medicine

## 2013-12-23 ENCOUNTER — Telehealth: Payer: Self-pay | Admitting: Internal Medicine

## 2013-12-23 MED ORDER — CIPROFLOXACIN HCL 250 MG PO TABS: 250.0000 mg | ORAL_TABLET | Freq: Two times a day (BID) | ORAL | Status: DC

## 2013-12-23 NOTE — Addendum Note (Signed)
Addended by: Janith Lima on: 12/23/2013 08:38 AM   Modules accepted: Orders

## 2013-12-23 NOTE — Telephone Encounter (Signed)
Faxed Tramadol refill

## 2013-12-24 ENCOUNTER — Other Ambulatory Visit: Payer: Self-pay | Admitting: Internal Medicine

## 2013-12-24 ENCOUNTER — Encounter: Payer: Self-pay | Admitting: Internal Medicine

## 2013-12-24 DIAGNOSIS — N390 Urinary tract infection, site not specified: Secondary | ICD-10-CM

## 2013-12-24 LAB — CULTURE, URINE COMPREHENSIVE

## 2013-12-24 MED ORDER — NITROFURANTOIN MONOHYD MACRO 100 MG PO CAPS: 100.0000 mg | ORAL_CAPSULE | Freq: Two times a day (BID) | ORAL | Status: AC

## 2013-12-31 ENCOUNTER — Telehealth: Payer: Self-pay | Admitting: Cardiovascular Disease

## 2013-12-31 NOTE — Telephone Encounter (Signed)
New message      Having cataract surgery Monday-----need clearance for Dr Dominga Ferry eye

## 2013-12-31 NOTE — Telephone Encounter (Signed)
Patient states that [facility] called her today and told her they needed clearance from Dr. Johnsie Cancel for her cataract surgery on Monday. [name] unaware if this is a new request or if they had previously contacted our office.   Triage nurse contacted [facility] for more information. Triage nurse spoke to [place] at [facility] [place] and she stated that she faxed the Surgical Clearance form this morning. Triage nurse advised her that Dr. Johnsie Cancel is unavailable until Wednesday. Sharyn Lull stated that perhaps a PA could complete the form. Triage nurse advised that the patient's cardiologist is the one that would need to complete surgical clearances.  Sharyn Lull stated that it was "not really a clearance just more like a FYI so you can tell us if there is anything we need to know". Triage nurse indicated that only a physician can provide clearance and a nurse can not do so, therefore there is no information we can provide her except to forward the request to Dr. Johnsie Cancel for him to see next week. Sharyn Lull then asked if information could be provided, such as the last time that the patient was seen by the Cardiologist and she could use that information. Triage nurse indicated that the requested form would be forwarded to Dr. Johnsie Cancel upon his return next week and in the meanwhile, there is no information available regarding patient's status for surgical clearance. Sharyn Lull stated that she will let Dr. Talbert Forest know that she informed the office of the surgery scheduled for Monday. Triage nurse emphasized that Dr. Johnsie Cancel would not see the request until mid-next week and that Dr. Talbert Forest needs to understand that is the status of the request.  There were three separate faxed request forms for Surgical Clearance faxed to this office today (12/31/13) from [place]:  11:16 am, 11:22 am and 11:25 am.  There is a circle around the question: "Is the patient medically stable to proceed with eye procedure?" There is  also an added comment on the cover page that states: "Surgery scheduled for Monday. Need form back by Friday. Last min addition. Plz call if not cleared." and " Plz note new fax# for Medical Clearances only. [phone]."  Triage nurse called patient back and informed her of the above information. Advised her that clearance could not be given at this time since the form had only been faxed today and that Dr. Johnsie Cancel is not in clinic the remainder of this week and won't return until early/mid next week. Patient verbalized understanding and said she had already spoken to [facility] a few minutes ago and that they were going to go ahead and proceed with the procedure without the clearance from Dr. Johnsie Cancel.   Triage nurse faxed [facility] and responded to the form that Dr. Johnsie Cancel is not available until mid-next week, as per phone conversation with Sharyn Lull.

## 2013-12-31 NOTE — Telephone Encounter (Signed)
Line busy 5/14 @10 :25 am

## 2014-01-01 ENCOUNTER — Other Ambulatory Visit: Payer: Self-pay | Admitting: Cardiovascular Disease

## 2014-01-12 ENCOUNTER — Other Ambulatory Visit (HOSPITAL_COMMUNITY): Payer: Self-pay | Admitting: Cardiology

## 2014-01-12 DIAGNOSIS — I6529 Occlusion and stenosis of unspecified carotid artery: Secondary | ICD-10-CM

## 2014-01-14 ENCOUNTER — Ambulatory Visit (HOSPITAL_COMMUNITY): Payer: Medicare Other | Attending: Cardiovascular Disease | Admitting: Cardiology

## 2014-01-14 DIAGNOSIS — I6529 Occlusion and stenosis of unspecified carotid artery: Secondary | ICD-10-CM | POA: Insufficient documentation

## 2014-01-14 NOTE — Progress Notes (Signed)
Carotid duplex complete 

## 2014-01-30 ENCOUNTER — Other Ambulatory Visit: Payer: Self-pay | Admitting: Internal Medicine

## 2014-03-12 ENCOUNTER — Ambulatory Visit (INDEPENDENT_AMBULATORY_CARE_PROVIDER_SITE_OTHER): Payer: Medicare Other | Admitting: Endocrinology

## 2014-03-12 ENCOUNTER — Encounter: Payer: Self-pay | Admitting: Endocrinology

## 2014-03-12 VITALS — BP 130/70 | HR 81 | Temp 98.6°F | Wt 148.0 lb

## 2014-03-12 DIAGNOSIS — I798 Other disorders of arteries, arterioles and capillaries in diseases classified elsewhere: Secondary | ICD-10-CM

## 2014-03-12 DIAGNOSIS — Z23 Encounter for immunization: Secondary | ICD-10-CM

## 2014-03-12 DIAGNOSIS — E1051 Type 1 diabetes mellitus with diabetic peripheral angiopathy without gangrene: Secondary | ICD-10-CM

## 2014-03-12 DIAGNOSIS — E1059 Type 1 diabetes mellitus with other circulatory complications: Secondary | ICD-10-CM

## 2014-03-12 LAB — HEMOGLOBIN A1C: Hgb A1c MFr Bld: 7.2 % — ABNORMAL HIGH (ref 4.6–6.5)

## 2014-03-12 NOTE — Progress Notes (Signed)
Subjective:    Patient ID: Emma Yu, female    DOB: 06-17-1927, 80 y.o.   MRN: [id number]  HPI Pt returns for f/u of type 1 dm (dx'ed 1999, on a routine blood test; she has been on insulin since 2001; she has mild if any neuropathy of the lower extremities; she has associated by CAD, nephropathy, and PAD; she has a one-touch ping insulin pump; she has declined continuous glucose monitor; she has never had pancreatitis, severe hypoglycemia, or DKA).  She averages a total of approx 60 units of humalog per day, via her pump.  She uses these settings: basal rate of 0.8 unit/hr, 24 hrs per day.   bolus of 11 units with each meal.   correction bolus (which some people call "sensitivity," or "insulin sensitivity ratio," or just "isr") of 1 unit for each by 25 which your glucose exceeds 120.  she brings a record of her cbg's which i have reviewed today.  i have scanned it into epic.   Past Medical History  Diagnosis Date  . PAD (peripheral artery disease)     s/p bilateral comon iliac artery stenting in 2002. Known significant  R SFA  disease. carotid dz noted 11/2011 followed by Dr. Bridgett Larsson  . Diabetes mellitus   . Hypertension   . Hyperlipidemia   . Venous insufficiency   . Chronic diastolic heart failure     Echocardiogram 12/10/11: Moderate LVH, EF 65-70%, moderate aortic stenosis, mean gradient 20, mild MS  . Atrial fibrillation     not a coumadin candidate secondary to fall risk  . DJD (degenerative joint disease) of hip     s/p R THR 10/2010  . Aortic stenosis     Moderate by echo 4/13 - mean 20 mmHg  . Mitral stenosis     Mild by echo 4/13  . Anemia     iron deficient  . Acute respiratory failure     12/2011 admission - decreased O2 sats on ambulation, improved by time of discharge  . CAD (coronary artery disease)     minimal plaque by cath 5/12    Past Surgical History  Procedure Laterality Date  . Total hip arthroplasty  1991, 1994    redo in 1994  . Lumbar disc  surgery  1999  . Cardiac catheterization  01/10/2011    No significant CAD  . Cholecystectomy    . Abdominal hysterectomy      History   Social History  . Marital Status: Widowed    Spouse Name: N/A    Number of Children: N/A  . Years of Education: N/A   Occupational History  .      retired   Social History Main Topics  . Smoking status: Former Smoker -- 2.00 packs/day for 25 years    Types: Cigarettes    Quit date: 08/20/1985  . Smokeless tobacco: Never Used     Comment: Retired-widowed 24, lives with son  . Alcohol Use: No  . Drug Use: No  . Sexual Activity: Not Currently    Birth Control/ Protection: Surgical   Other Topics Concern  . Not on file   Social History Narrative   Lives with son   Widowed 34   Quit smoking in 1987    Current Outpatient Prescriptions on File Prior to Visit  Medication Sig Dispense Refill  . Ascorbic Acid (VITAMIN C) 500 MG tablet Take 500 mg by mouth daily.        Marland Kitchen aspirin EC  81 MG tablet Take 81 mg by mouth 2 (two) times daily.      Marland Kitchen b complex vitamins tablet Take 1 tablet by mouth daily.        . Cholecalciferol (VITAMIN D3) 2000 UNITS capsule Take 2,000 Units by mouth daily.        . cloNIDine (CATAPRES) 0.2 MG tablet TAKE 1 TABLET BY MOUTH TWICE A DAY  60 tablet  11  . fish oil-omega-3 fatty acids 1000 MG capsule Take 2 g by mouth daily.        . furosemide (LASIX) 40 MG tablet TAKE 1 TABLET BY MOUTH EVERY DAY  30 tablet  5  . Insulin Infusion Pump Supplies (INSET INFUSION SET 23" 6MM) MISC 1 Device by Does not apply route every 3 (three) days.      . Insulin Infusion Pump Supplies (INSULIN PUMP SYRINGE RESERVOIR) MISC 1 Device by Does not apply route every 3 (three) days. Animas 2 ml      . insulin lispro (HUMALOG) 100 UNIT/ML injection For use in pump, total of 70 units per day  7 vial  0  . metoprolol tartrate (LOPRESSOR) 25 MG tablet TAKE 1 TABLET BY MOUTH TWICE A DAY  60 tablet  3  . omeprazole (PRILOSEC) 40 MG capsule  Take 40 mg by mouth daily before breakfast.       . pravastatin (PRAVACHOL) 20 MG tablet TAKE 1 TABLET BY MOUTH IN THE EVENING  30 tablet  11  . pregabalin (LYRICA) 50 MG capsule Take 1 capsule (50 mg total) by mouth 3 (three) times daily.  84 capsule  0  . Probiotic Product (PROBIOTIC PO) Take 1 tablet by mouth daily at 10 pm.      . traMADol (ULTRAM) 50 MG tablet TAKE 2 TABLETS BY MOUTH EVERY 6 HOURS AS NEEDED FOR PAIN  120 tablet  3  . valsartan (DIOVAN) 320 MG tablet Take 320 mg by mouth daily.      . valsartan (DIOVAN) 320 MG tablet TAKE 1 TABLET BY MOUTH AT BEDTIME  90 tablet  2  . co-enzyme Q-10 50 MG capsule Take 50 mg by mouth daily.      . Dietary Management Product (VASCULERA) TABS TAKE 1 TABLET DAILY  90 tablet  3  . ketoconazole (NIZORAL) 2 % cream Apply topically 2 (two) times daily.  60 g  1   No current facility-administered medications on file prior to visit.    Allergies  Allergen Reactions  . Carvedilol Other (See Comments)    Heart stops  . Clindamycin/Lincomycin Other (See Comments)    tremors  . Diltiazem Hcl Other (See Comments)    Chest pain  . Fenofibrate Other (See Comments)    Unknown - [place] - MAR  . Hctz [Hydrochlorothiazide] Other (See Comments)    Chest pain  . Nisoldipine Other (See Comments)    Chest pain  . Nitroglycerin Other (See Comments)    Unknown - [place] MAR  . Propranolol Diarrhea    Chest pain  . Septra [Sulfamethoxazole-Tmp Ds]     Rash, cyst  . Patrici Ranks Fumarate] Other (See Comments)    Unknown reaction  . Valturna [Aliskiren-Valsartan] Other (See Comments)    Unknown reaction    Family History  Problem Relation Age of Onset  . Diabetes Father   . Diabetes Other     5/8 sibs    BP 130/70  Pulse 81  Temp(Src) 98.6 F (37 C) (Oral)  Wt 148 lb (  67.132 kg)  SpO2 93%   Review of Systems She denies hypoglycemia and weight change.      Objective:   Physical Exam Pulses: dorsalis pedis are absent bilaterally.  Feet:  no deformity. no ulcer on the feet. feet are of normal temp. no edema. There is mild erythema of the legs. There are bilat varicosities, and rusty discoloration on the legs and feet. There is severe bilateral onychomycosis.  Neuro: sensation is intact to touch on the feet.     Lab Results  Component Value Date   HGBA1C 7.2* 03/12/2014       Assessment & Plan:  DM: well-controlled Frail elderly state: in this setting, she is not a candidate for aggressive glycemic control.     Patient is advised the following: Patient Instructions  A diabetes blood test is requested for you today.  We'll contact you with results.  take a basal rate of 0.8 unit/hr, 24 hrs per day.   Take a bolus of 11 units with each meal.   continue correction bolus (which some people call "sensitivity," or "insulin sensitivity ratio," or just "isr") of 1 unit for each by 25 which your glucose exceeds 120. check your blood sugar 4 times a day--before the 3 meals, and at bedtime.  also check if you have symptoms of your blood sugar being too high or too low.  please keep a record of the readings and bring it to your next appointment here.  please call us sooner if you are having low blood sugar episodes. Please make a follow-up appointment in 3 months.

## 2014-03-12 NOTE — Patient Instructions (Signed)
A diabetes blood test is requested for you today.  We'll contact you with results.  take a basal rate of 0.8 unit/hr, 24 hrs per day.   Take a bolus of 11 units with each meal.   continue correction bolus (which some people call "sensitivity," or "insulin sensitivity ratio," or just "isr") of 1 unit for each by 25 which your glucose exceeds 120. check your blood sugar 4 times a day--before the 3 meals, and at bedtime.  also check if you have symptoms of your blood sugar being too high or too low.  please keep a record of the readings and bring it to your next appointment here.  please call us sooner if you are having low blood sugar episodes. Please make a follow-up appointment in 3 months.

## 2014-03-15 NOTE — Addendum Note (Signed)
Addended by: Moody Bruins E on: 03/15/2014 10:23 AM   Modules accepted: Orders

## 2014-03-23 ENCOUNTER — Ambulatory Visit (INDEPENDENT_AMBULATORY_CARE_PROVIDER_SITE_OTHER): Payer: Medicare Other | Admitting: Cardiovascular Disease

## 2014-03-23 ENCOUNTER — Encounter: Payer: Self-pay | Admitting: Cardiovascular Disease

## 2014-03-23 VITALS — BP 140/50 | HR 60 | Ht 65.0 in | Wt 150.4 lb

## 2014-03-23 DIAGNOSIS — I359 Nonrheumatic aortic valve disorder, unspecified: Secondary | ICD-10-CM

## 2014-03-23 DIAGNOSIS — I6529 Occlusion and stenosis of unspecified carotid artery: Secondary | ICD-10-CM

## 2014-03-23 DIAGNOSIS — I739 Peripheral vascular disease, unspecified: Secondary | ICD-10-CM

## 2014-03-23 DIAGNOSIS — I658 Occlusion and stenosis of other precerebral arteries: Secondary | ICD-10-CM

## 2014-03-23 DIAGNOSIS — I872 Venous insufficiency (chronic) (peripheral): Secondary | ICD-10-CM

## 2014-03-23 DIAGNOSIS — I2584 Coronary atherosclerosis due to calcified coronary lesion: Secondary | ICD-10-CM

## 2014-03-23 DIAGNOSIS — I10 Essential (primary) hypertension: Secondary | ICD-10-CM

## 2014-03-23 DIAGNOSIS — I2510 Atherosclerotic heart disease of native coronary artery without angina pectoris: Secondary | ICD-10-CM

## 2014-03-23 DIAGNOSIS — I350 Nonrheumatic aortic (valve) stenosis: Secondary | ICD-10-CM

## 2014-03-23 DIAGNOSIS — I6523 Occlusion and stenosis of bilateral carotid arteries: Secondary | ICD-10-CM

## 2014-03-23 NOTE — Assessment & Plan Note (Signed)
Moderate by echo 2 years ago F/U echo Frail even for TAVR

## 2014-03-23 NOTE — Progress Notes (Signed)
Patient ID: Emma Yu, female   DOB: 1927-02-07, 80 y.o.   MRN: [id number] This is an 80 year old female who is here today for a follow up She has a history of mod aortic stenosis, Atrial fibrillation, diastolic heart failure, peripheral arterial disease, status post bilateral common iliac artery stenting in 2002 done by Dr. Maple Hudson with known significant right SFA disease, diabetes, hypertension, hyperlipidemia. LHC 01/10/11: Luminal irregularities.  She had multiple hospitalizations over the last year related to dehydration, anemia, atrial fibrillation and diastolic heart failure. She also had a revision of right hip surgery with very difficult healing. She walks very slowly with a walker. [name] deemed not to be an anticoagulation candidate.  She has venous reflux disease and is on vasculera and has had Rx for cellulitis last year   Sees [facility] for [facility]  09/17/12 ABI right .79 and on left .89  Duplex 4/14 40-59% bilateral ICA    Still with right hip pain limits ambulation    ROS: Denies fever, malais, weight loss, blurry vision, decreased visual acuity, cough, sputum, SOB, hemoptysis, pleuritic pain, palpitaitons, heartburn, abdominal pain, melena, lower extremity edema, claudication, or rash.  All other systems reviewed and negative  General: Affect appropriate Frail elderly female  HEENT: normal Neck supple with no adenopathy JVP normal bilateral  bruits no thyromegaly Lungs clear with no wheezing and good diaphragmatic motion Heart:  S1/S2 AS  murmur, no rub, gallop or click PMI normal Abdomen: benighn, BS positve, no tenderness, no AAA no bruit.  No HSM or HJR Distal pulses ihard to palpate below knees  Plus 2 bilateral edema with stasis  Neuro non-focal Skin warm and dry No muscular weakness   Current Outpatient Prescriptions  Medication Sig Dispense Refill  . Ascorbic Acid (VITAMIN C) 500 MG tablet Take 500 mg by mouth daily.        Marland Kitchen aspirin EC 81 MG tablet Take  81 mg by mouth 2 (two) times daily.      Marland Kitchen b complex vitamins tablet Take 1 tablet by mouth daily.        . Cholecalciferol (VITAMIN D3) 2000 UNITS capsule Take 2,000 Units by mouth daily.        . cloNIDine (CATAPRES) 0.2 MG tablet TAKE 1 TABLET BY MOUTH QD      . fish oil-omega-3 fatty acids 1000 MG capsule Take 2 g by mouth daily.        . furosemide (LASIX) 40 MG tablet TAKING 1/2 TAB QD      . Insulin Infusion Pump Supplies (INSET INFUSION SET 23" 6MM) MISC 1 Device by Does not apply route every 3 (three) days.      . Insulin Infusion Pump Supplies (INSULIN PUMP SYRINGE RESERVOIR) MISC 1 Device by Does not apply route every 3 (three) days. Animas 2 ml      . insulin lispro (HUMALOG) 100 UNIT/ML injection For use in pump, total of 70 units per day  7 vial  0  . metoprolol tartrate (LOPRESSOR) 25 MG tablet TAKE 1 TABLET BY MOUTH TWICE A DAY  60 tablet  3  . omeprazole (PRILOSEC) 40 MG capsule Take 40 mg by mouth daily before breakfast.       . pravastatin (PRAVACHOL) 20 MG tablet TAKE 1 TABLET BY MOUTH IN THE EVENING  30 tablet  11  . Probiotic Product (PROBIOTIC PO) Take 1 tablet by mouth daily at 10 pm.      . traMADol (ULTRAM) 50 MG tablet TAKE  2 TABLETS BY MOUTH EVERY 6 HOURS AS NEEDED FOR PAIN  120 tablet  3  . valsartan (DIOVAN) 320 MG tablet Take 320 mg by mouth daily.       No current facility-administered medications for this visit.    Allergies  Carvedilol; Clindamycin/lincomycin; Diltiazem hcl; Fenofibrate; Hctz; Nisoldipine; Nitroglycerin; Propranolol; Septra; Satira Sark  Electrocardiogram:  2/10  SR LAD LVH   Assessment and Plan

## 2014-03-23 NOTE — Assessment & Plan Note (Signed)
No resting claudication or skin break down  Sees salon for foot care/nails regularly  F/U [facility] 6 months

## 2014-03-23 NOTE — Assessment & Plan Note (Signed)
Stable with no angina and good activity level.  Continue medical Rx  

## 2014-03-23 NOTE — Assessment & Plan Note (Signed)
Continue current dose of lasix

## 2014-03-23 NOTE — Patient Instructions (Addendum)
Your physician wants you to follow-up in:  [place] will receive a reminder letter in the mail two months in advance. If you don't receive a letter, please call our office to schedule the follow-up appointment. Your physician recommends that you continue on your current medications as directed. Please refer to the Current Medication list given to you today. Your physician has requested that you have an echocardiogram. Echocardiography is a painless test that uses sound waves to create images of your heart. It provides your doctor with information about the size and shape of your heart and how well your heart's chambers and valves are working. This procedure takes approximately one hour. There are no restrictions for this procedure. NOW  Your physician has requested that you have a carotid duplex. This test is an ultrasound of the carotid arteries in your neck. It looks at blood flow through these arteries that supply the brain with blood. Allow one hour for this exam. There are no restrictions or special instructions. DUE IN  MAY

## 2014-03-23 NOTE — Assessment & Plan Note (Signed)
Well controlled.  Continue current medications and low sodium Dash type diet.    

## 2014-03-30 LAB — HM DIABETES EYE EXAM

## 2014-03-31 ENCOUNTER — Ambulatory Visit (HOSPITAL_COMMUNITY): Payer: Medicare Other | Attending: Cardiovascular Disease | Admitting: Radiology

## 2014-03-31 DIAGNOSIS — I739 Peripheral vascular disease, unspecified: Secondary | ICD-10-CM | POA: Insufficient documentation

## 2014-03-31 DIAGNOSIS — I379 Nonrheumatic pulmonary valve disorder, unspecified: Secondary | ICD-10-CM | POA: Insufficient documentation

## 2014-03-31 DIAGNOSIS — I10 Essential (primary) hypertension: Secondary | ICD-10-CM | POA: Diagnosis not present

## 2014-03-31 DIAGNOSIS — I359 Nonrheumatic aortic valve disorder, unspecified: Secondary | ICD-10-CM

## 2014-03-31 DIAGNOSIS — E119 Type 2 diabetes mellitus without complications: Secondary | ICD-10-CM | POA: Insufficient documentation

## 2014-03-31 DIAGNOSIS — I059 Rheumatic mitral valve disease, unspecified: Secondary | ICD-10-CM | POA: Diagnosis not present

## 2014-03-31 DIAGNOSIS — I872 Venous insufficiency (chronic) (peripheral): Secondary | ICD-10-CM | POA: Diagnosis not present

## 2014-03-31 DIAGNOSIS — I4891 Unspecified atrial fibrillation: Secondary | ICD-10-CM

## 2014-03-31 DIAGNOSIS — I079 Rheumatic tricuspid valve disease, unspecified: Secondary | ICD-10-CM | POA: Insufficient documentation

## 2014-03-31 DIAGNOSIS — Z87891 Personal history of nicotine dependence: Secondary | ICD-10-CM | POA: Insufficient documentation

## 2014-03-31 DIAGNOSIS — I517 Cardiomegaly: Secondary | ICD-10-CM | POA: Insufficient documentation

## 2014-03-31 DIAGNOSIS — I350 Nonrheumatic aortic (valve) stenosis: Secondary | ICD-10-CM

## 2014-03-31 NOTE — Progress Notes (Signed)
Echocardiogram performed.  

## 2014-04-05 ENCOUNTER — Other Ambulatory Visit: Payer: Self-pay | Admitting: Internal Medicine

## 2014-04-19 ENCOUNTER — Ambulatory Visit (INDEPENDENT_AMBULATORY_CARE_PROVIDER_SITE_OTHER): Payer: Medicare Other | Admitting: Internal Medicine

## 2014-04-19 ENCOUNTER — Ambulatory Visit (INDEPENDENT_AMBULATORY_CARE_PROVIDER_SITE_OTHER)
Admission: RE | Admit: 2014-04-19 | Discharge: 2014-04-19 | Disposition: A | Discharge status: Home / Self Care | Payer: Medicare Other | Source: Ambulatory Visit | Attending: Internal Medicine | Admitting: Internal Medicine

## 2014-04-19 ENCOUNTER — Encounter: Payer: Self-pay | Admitting: Internal Medicine

## 2014-04-19 VITALS — BP 138/72 | HR 88 | Temp 98.2°F | Resp 16 | Ht 65.0 in | Wt 146.5 lb

## 2014-04-19 DIAGNOSIS — Z23 Encounter for immunization: Secondary | ICD-10-CM

## 2014-04-19 DIAGNOSIS — R059 Cough, unspecified: Secondary | ICD-10-CM

## 2014-04-19 DIAGNOSIS — I4819 Other persistent atrial fibrillation: Secondary | ICD-10-CM

## 2014-04-19 DIAGNOSIS — I10 Essential (primary) hypertension: Secondary | ICD-10-CM

## 2014-04-19 DIAGNOSIS — I798 Other disorders of arteries, arterioles and capillaries in diseases classified elsewhere: Secondary | ICD-10-CM

## 2014-04-19 DIAGNOSIS — E1051 Type 1 diabetes mellitus with diabetic peripheral angiopathy without gangrene: Secondary | ICD-10-CM

## 2014-04-19 DIAGNOSIS — R05 Cough: Secondary | ICD-10-CM

## 2014-04-19 DIAGNOSIS — I4891 Unspecified atrial fibrillation: Secondary | ICD-10-CM

## 2014-04-19 DIAGNOSIS — E1059 Type 1 diabetes mellitus with other circulatory complications: Secondary | ICD-10-CM

## 2014-04-19 MED ORDER — METOPROLOL TARTRATE 50 MG PO TABS: 50.0000 mg | ORAL_TABLET | Freq: Two times a day (BID) | ORAL | Status: DC

## 2014-04-19 NOTE — Patient Instructions (Signed)

## 2014-04-19 NOTE — Progress Notes (Signed)
Subjective:    Patient ID: Emma Yu, female    DOB: 1926-11-24, 80 y.o.   MRN: [id number]  Hypertension This is a chronic problem. The current episode started more than 1 year ago. The problem is unchanged. The problem is controlled. Associated symptoms include anxiety and palpitations (she complains that her heart rate has been up to 120-130 recently). Pertinent negatives include no blurred vision, chest pain, headaches, malaise/fatigue, neck pain, orthopnea, peripheral edema, PND, shortness of breath or sweats. There are no associated agents to hypertension. Past treatments include angiotensin blockers, beta blockers and diuretics. The current treatment provides moderate improvement. Compliance problems include diet and exercise.  Hypertensive end-organ damage includes CAD/MI and PVD.      Review of Systems  Constitutional: Negative.  Negative for chills, malaise/fatigue, diaphoresis, appetite change and fatigue.  Eyes: Negative.  Negative for blurred vision.  Respiratory: Negative.  Negative for cough, choking, chest tightness, shortness of breath, wheezing and stridor.   Cardiovascular: Positive for palpitations (she complains that her heart rate has been up to 120-130 recently). Negative for chest pain, orthopnea, leg swelling and PND.  Gastrointestinal: Negative.  Negative for nausea, vomiting, abdominal pain, diarrhea, constipation and blood in stool.  Endocrine: Negative.  Negative for polydipsia, polyphagia and polyuria.  Genitourinary: Negative.   Musculoskeletal: Negative.  Negative for back pain, myalgias and neck pain.  Skin: Negative.   Allergic/Immunologic: Negative.   Neurological: Positive for tremors. Negative for dizziness, syncope, weakness, light-headedness and headaches.  Hematological: Negative.  Negative for adenopathy. Does not bruise/bleed easily.  Psychiatric/Behavioral: Negative.        Objective:   Physical Exam  Vitals reviewed. Constitutional:  [name] oriented to person, place, and time. She appears well-developed and well-nourished. No distress.  HENT:  Head: Normocephalic and atraumatic.  Mouth/Throat: Oropharynx is clear and moist. No oropharyngeal exudate.  Eyes: Conjunctivae are normal. Right eye exhibits no discharge. Left eye exhibits no discharge. No scleral icterus.  Neck: Normal range of motion. Neck supple. No JVD present. No tracheal deviation present. No thyromegaly present.  Cardiovascular: S1 normal, S2 normal, normal heart sounds and intact distal pulses.  An irregularly irregular rhythm present. Tachycardia present.  Exam reveals no gallop.   Pulmonary/Chest: Effort normal and breath sounds normal. No stridor. No respiratory distress. She has no wheezes. She has no rales. She exhibits no tenderness.  Abdominal: Soft. Bowel sounds are normal. She exhibits no distension and no mass. There is no tenderness. There is no rebound and no guarding.  Musculoskeletal: Normal range of motion. She exhibits no edema and no tenderness.  Lymphadenopathy:    She has no cervical adenopathy.  Neurological: [name] oriented to person, place, and time.  Skin: Skin is warm and dry. No rash noted. [name] not diaphoretic. No erythema. No pallor.     Lab Results  Component Value Date   WBC 6.5 01/24/2012   HGB 12.7 01/24/2012   HCT 41.0 01/24/2012   PLT 186 01/24/2012   GLUCOSE 74 12/21/2013   CHOL 109 12/21/2013   TRIG 146.0 12/21/2013   HDL 50.40 12/21/2013   LDLCALC 29 12/21/2013   ALT 9 12/10/2011   AST 14 12/10/2011   NA 142 12/21/2013   K 4.4 12/21/2013   CL 103 12/21/2013   CREATININE 1.3* 12/21/2013   BUN 29* 12/21/2013   CO2 33* 12/21/2013   TSH 2.69 02/16/2013   INR 1.18 12/25/2011   HGBA1C 7.2* 03/12/2014   MICROALBUR 2.2* 07/20/2013  Assessment & Plan:

## 2014-04-21 ENCOUNTER — Encounter: Payer: Self-pay | Admitting: Internal Medicine

## 2014-04-21 NOTE — Assessment & Plan Note (Signed)
Her heart rate is elevated but she has no s/s related to this I will increase her dose of metoprolol

## 2014-04-21 NOTE — Assessment & Plan Note (Signed)
Her BP is well controlled 

## 2014-04-21 NOTE — Assessment & Plan Note (Signed)
Her blood sugars are well controlled 

## 2014-05-05 ENCOUNTER — Other Ambulatory Visit: Payer: Self-pay

## 2014-05-05 MED ORDER — CLONIDINE HCL 0.2 MG PO TABS: ORAL_TABLET | ORAL | Status: DC

## 2014-05-05 MED ORDER — FUROSEMIDE 40 MG PO TABS: ORAL_TABLET | ORAL | Status: DC

## 2014-05-20 ENCOUNTER — Ambulatory Visit (INDEPENDENT_AMBULATORY_CARE_PROVIDER_SITE_OTHER): Payer: Medicare Other | Admitting: Internal Medicine

## 2014-05-20 ENCOUNTER — Encounter: Payer: Self-pay | Admitting: Internal Medicine

## 2014-05-20 VITALS — BP 170/82 | HR 84 | Temp 98.4°F | Resp 12 | Ht 60.0 in | Wt 148.4 lb

## 2014-05-20 DIAGNOSIS — L98499 Non-pressure chronic ulcer of skin of other sites with unspecified severity: Secondary | ICD-10-CM

## 2014-05-20 DIAGNOSIS — I739 Peripheral vascular disease, unspecified: Secondary | ICD-10-CM

## 2014-05-20 DIAGNOSIS — I872 Venous insufficiency (chronic) (peripheral): Secondary | ICD-10-CM

## 2014-05-20 DIAGNOSIS — I70209 Unspecified atherosclerosis of native arteries of extremities, unspecified extremity: Secondary | ICD-10-CM

## 2014-05-20 NOTE — Patient Instructions (Signed)
You can use vaseline or petroleum jelly on the wound on your leg. Do not rub it hard when washing. Just gentle cleansing with soapy water is fine. If the area starts to put out pus or has a red rash around it call us back.  Keep up the good work with your wounds and they seem to be healing okay. Do not use the anti-fungal cream on them.

## 2014-05-20 NOTE — Progress Notes (Signed)
Pre visit review using our clinic review tool, if applicable. No additional management support is needed unless otherwise documented below in the visit note. 

## 2014-05-21 NOTE — Assessment & Plan Note (Signed)
Overall not much swelling in the legs although slight bit more swelling on the left than right 1+ at best.

## 2014-05-21 NOTE — Assessment & Plan Note (Signed)
She has stents in her legs and wound on the left leg about 85mm diameter with good scab coverage and new skin covering about a 40mm ring around the scab. Appears to be healing, advised coverage with petroleum jelly for non-stick dressing. Mild cleansing with warm soapy water but not scrubbing.

## 2014-05-21 NOTE — Progress Notes (Signed)
   Subjective:    Patient ID: Emma Yu, female    DOB: Dec 28, 1926, 80 y.o.   MRN: [id number]  HPI The patient is an 80 YO female who is coming in today for a wound check on her arm and leg. She has had the wound on her leg for about 6 months and it is doing okay. She has chronic swelling in her legs off and on. No color change or odor or discharge from that wound. No fevers. She hit her arm on something about 1 week ago and wants to have that one looked at.   Review of Systems  Constitutional: Negative for fever and chills.  Respiratory: Negative for chest tightness.   Cardiovascular: Positive for leg swelling. Negative for chest pain and palpitations.  Skin: Positive for color change and wound. Negative for rash.      Objective:   Physical Exam  Constitutional: She appears well-developed and well-nourished.  HENT:  Head: Normocephalic and atraumatic.  Eyes: EOM are normal.  Neck: Normal range of motion.  Cardiovascular: Normal rate and regular rhythm.   Pulmonary/Chest: Effort normal and breath sounds normal.  Abdominal: Soft.  Skin: Skin is warm and dry.  Chronic venous stasis color change on bilateral shins. Leg shin with a 62mm diameter wound with good scab and appears to be healing with 2 mm ring around of new pink tissue. Additional wound on left arm about 1 mm diameter appears to be healing.      Assessment & Plan:

## 2014-06-08 ENCOUNTER — Other Ambulatory Visit: Payer: Self-pay

## 2014-06-08 MED ORDER — PRAVASTATIN SODIUM 20 MG PO TABS: ORAL_TABLET | ORAL | Status: DC

## 2014-06-14 ENCOUNTER — Ambulatory Visit (INDEPENDENT_AMBULATORY_CARE_PROVIDER_SITE_OTHER): Payer: Medicare Other | Admitting: Endocrinology

## 2014-06-14 ENCOUNTER — Encounter: Payer: Self-pay | Admitting: Endocrinology

## 2014-06-14 VITALS — BP 120/82 | HR 89 | Temp 97.5°F | Ht 65.0 in | Wt 146.0 lb

## 2014-06-14 DIAGNOSIS — E1051 Type 1 diabetes mellitus with diabetic peripheral angiopathy without gangrene: Secondary | ICD-10-CM

## 2014-06-14 DIAGNOSIS — E1059 Type 1 diabetes mellitus with other circulatory complications: Secondary | ICD-10-CM

## 2014-06-14 DIAGNOSIS — I739 Peripheral vascular disease, unspecified: Secondary | ICD-10-CM

## 2014-06-14 LAB — HEMOGLOBIN A1C: Hgb A1c MFr Bld: 6.4 % (ref 4.6–6.5)

## 2014-06-14 NOTE — Progress Notes (Signed)
Subjective:    Patient ID: Emma Yu, female    DOB: 1927-06-17, 80 y.o.   MRN: [id number]  HPI Pt returns for f/u of diabetes mellitus: DM type: 1 Dx'ed: 5427 Complications: CAD, nephropathy, and PAD Therapy: insulin since 2001 GDM: never DKA: never Severe hypoglycemia: never Pancreatitis: never Other: she has a one-touch ping insulin pump; she has declined continuous glucose monitor Interval history: She averages a total of approx 60 units of humalog per day, via her pump.  She uses these settings: basal rate of 0.8 unit/hr, 24 hrs per day.   bolus of 11 units with each meal.   correction bolus (which some people call "sensitivity," or "insulin sensitivity ratio," or just "isr") of 1 unit for each by 25 which your glucose exceeds 120.  she brings a record of her cbg's which i have reviewed today.  i have scanned it into epic. Pt states 3 weeks of moderate swelling at the left leg, and assoc pain.  Similar sxs have been checked several times in the past, with venous dopplers.  Past Medical History  Diagnosis Date  . PAD (peripheral artery disease)     s/p bilateral comon iliac artery stenting in 2002. Known significant  R SFA  disease. carotid dz noted 11/2011 followed by Dr. Bridgett Larsson  . Diabetes mellitus   . Hypertension   . Hyperlipidemia   . Venous insufficiency   . Chronic diastolic heart failure     Echocardiogram 12/10/11: Moderate LVH, EF 65-70%, moderate aortic stenosis, mean gradient 20, mild MS  . Atrial fibrillation     not a coumadin candidate secondary to fall risk  . DJD (degenerative joint disease) of hip     s/p R THR 10/2010  . Aortic stenosis     Moderate by echo 4/13 - mean 20 mmHg  . Mitral stenosis     Mild by echo 4/13  . Anemia     iron deficient  . Acute respiratory failure     12/2011 admission - decreased O2 sats on ambulation, improved by time of discharge  . CAD (coronary artery disease)     minimal plaque by cath 5/12    Past Surgical  History  Procedure Laterality Date  . Total hip arthroplasty  1991, 1994    redo in 1994  . Lumbar disc surgery  1999  . Cardiac catheterization  01/10/2011    No significant CAD  . Cholecystectomy    . Abdominal hysterectomy      History   Social History  . Marital Status: Widowed    Spouse Name: N/A    Number of Children: N/A  . Years of Education: N/A   Occupational History  .      retired   Social History Main Topics  . Smoking status: Former Smoker -- 2.00 packs/day for 25 years    Types: Cigarettes    Quit date: 08/20/1985  . Smokeless tobacco: Never Used     Comment: Retired-widowed 66, lives with son  . Alcohol Use: No  . Drug Use: No  . Sexual Activity: Not Currently    Birth Control/ Protection: Surgical   Other Topics Concern  . Not on file   Social History Narrative   Lives with son   Widowed 40   Quit smoking in 1987    Current Outpatient Prescriptions on File Prior to Visit  Medication Sig Dispense Refill  . Ascorbic Acid (VITAMIN C) 500 MG tablet Take 500 mg by mouth daily.        Marland Kitchen  aspirin EC 81 MG tablet Take 81 mg by mouth 2 (two) times daily.      Marland Kitchen b complex vitamins tablet Take 1 tablet by mouth daily.        . Cholecalciferol (VITAMIN D3) 2000 UNITS capsule Take 2,000 Units by mouth daily.        . cloNIDine (CATAPRES) 0.2 MG tablet TAKE 1 TABLET BY MOUTH QD  90 tablet  3  . fish oil-omega-3 fatty acids 1000 MG capsule Take 2 g by mouth daily.        . furosemide (LASIX) 40 MG tablet TAKING 1/2 TAB QD  90 tablet  3  . gabapentin (NEURONTIN) 300 MG capsule TAKE 2 CAPSULES BY MOUTH AT BEDTIME  180 capsule  3  . Insulin Infusion Pump Supplies (INSET INFUSION SET 23" 6MM) MISC 1 Device by Does not apply route every 3 (three) days.      . Insulin Infusion Pump Supplies (INSULIN PUMP SYRINGE RESERVOIR) MISC 1 Device by Does not apply route every 3 (three) days. Animas 2 ml      . insulin lispro (HUMALOG) 100 UNIT/ML injection For use in pump,  total of 70 units per day  7 vial  0  . metoprolol (LOPRESSOR) 50 MG tablet Take 1 tablet (50 mg total) by mouth 2 (two) times daily.  180 tablet  3  . omeprazole (PRILOSEC) 40 MG capsule Take 40 mg by mouth daily before breakfast.       . pravastatin (PRAVACHOL) 20 MG tablet TAKE 1 TABLET BY MOUTH IN THE EVENING  30 tablet  11  . Probiotic Product (PROBIOTIC PO) Take 1 tablet by mouth daily at 10 pm.      . traMADol (ULTRAM) 50 MG tablet TAKE 2 TABLETS BY MOUTH EVERY 6 HOURS AS NEEDED FOR PAIN  120 tablet  3  . valsartan (DIOVAN) 320 MG tablet Take 320 mg by mouth daily.       No current facility-administered medications on file prior to visit.    Allergies  Allergen Reactions  . Carvedilol Other (See Comments)    Heart stops  . Clindamycin/Lincomycin Other (See Comments)    tremors  . Diltiazem Hcl Other (See Comments)    Chest pain  . Fenofibrate Other (See Comments)    Unknown - [facility] - MAR  . Hctz [Hydrochlorothiazide] Other (See Comments)    Chest pain  . Nisoldipine Other (See Comments)    Chest pain  . Nitroglycerin Other (See Comments)    Unknown - [facility]  . Propranolol Diarrhea    Chest pain  . Septra [Sulfamethoxazole-Tmp Ds]     Rash, cyst  . Patrici Ranks Fumarate] Other (See Comments)    Unknown reaction  . Valturna [Aliskiren-Valsartan] Other (See Comments)    Unknown reaction    Family History  Problem Relation Age of Onset  . Diabetes Father   . Diabetes Other     5/8 sibs    BP 120/82  Pulse 89  Temp(Src) 97.5 F (36.4 C) (Oral)  Ht 5\' 5"  (1.651 m)  Wt 146 lb (66.225 kg)  BMI 24.30 kg/m2  SpO2 90%   Review of Systems Denies chest and sob.      Objective:   Physical Exam VITAL SIGNS:  See vs page GENERAL: no distress Pulses: dorsalis pedis are absent bilaterally.  Feet: no deformity. no ulcer on the feet. feet are of normal temp. 2+ bilat leg edema. The left calf is slightly tender.  There  is mild erythema of the legs. There are bilat  varicosities, and rusty discoloration on the legs and feet. There is severe bilateral onychomycosis.  Neuro: sensation is intact to touch on the feet.    Lab Results  Component Value Date   HGBA1C 6.4 06/14/2014   (i reviewed LE venous dopplers from 2013)    Assessment & Plan:  DM: overcontrolled, given her advanced age. Leg swelling, new to me, uncertain etiology.  Patient is advised the following: Patient Instructions  A diabetes blood test is requested for you today.  We'll contact you with results.  take a basal rate of 0.8 unit/hr, 24 hrs per day.   Take a bolus of 11 units with each meal.  (if today's blood test is high, we can increase this) Take a correction bolus (which some people call "sensitivity," or "insulin sensitivity ratio," or just "isr") of 1 unit for each by 25 which your glucose exceeds 120. Please see Dr Ronnald Ramp for your leg swelling.   check your blood sugar 4 times a day--before the 3 meals, and at bedtime.  also check if you have symptoms of your blood sugar being too high or too low.  please keep a record of the readings and bring it to your next appointment here.  please call us sooner if you are having low blood sugar episodes. Please make a follow-up appointment in 3 months.

## 2014-06-14 NOTE — Patient Instructions (Addendum)
A diabetes blood test is requested for you today.  We'll contact you with results.  take a basal rate of 0.8 unit/hr, 24 hrs per day.   Take a bolus of 11 units with each meal.  (if today's blood test is high, we can increase this) Take a correction bolus (which some people call "sensitivity," or "insulin sensitivity ratio," or just "isr") of 1 unit for each by 25 which your glucose exceeds 120. Please see Dr Ronnald Ramp for your leg swelling.   check your blood sugar 4 times a day--before the 3 meals, and at bedtime.  also check if you have symptoms of your blood sugar being too high or too low.  please keep a record of the readings and bring it to your next appointment here.  please call us sooner if you are having low blood sugar episodes. Please make a follow-up appointment in 3 months.

## 2014-06-16 ENCOUNTER — Encounter: Payer: Self-pay | Admitting: Internal Medicine

## 2014-06-16 ENCOUNTER — Telehealth: Payer: Self-pay | Admitting: Endocrinology

## 2014-06-16 ENCOUNTER — Ambulatory Visit (INDEPENDENT_AMBULATORY_CARE_PROVIDER_SITE_OTHER): Payer: Medicare Other | Admitting: Internal Medicine

## 2014-06-16 ENCOUNTER — Other Ambulatory Visit (INDEPENDENT_AMBULATORY_CARE_PROVIDER_SITE_OTHER): Payer: Medicare Other

## 2014-06-16 ENCOUNTER — Telehealth: Payer: Self-pay | Admitting: *Deleted

## 2014-06-16 VITALS — BP 108/58 | HR 62 | Temp 97.6°F | Resp 16 | Ht 65.0 in | Wt 143.5 lb

## 2014-06-16 DIAGNOSIS — I10 Essential (primary) hypertension: Secondary | ICD-10-CM

## 2014-06-16 DIAGNOSIS — E1051 Type 1 diabetes mellitus with diabetic peripheral angiopathy without gangrene: Secondary | ICD-10-CM

## 2014-06-16 DIAGNOSIS — M7989 Other specified soft tissue disorders: Secondary | ICD-10-CM

## 2014-06-16 DIAGNOSIS — L98499 Non-pressure chronic ulcer of skin of other sites with unspecified severity: Secondary | ICD-10-CM

## 2014-06-16 DIAGNOSIS — N183 Chronic kidney disease, stage 3 unspecified: Secondary | ICD-10-CM | POA: Insufficient documentation

## 2014-06-16 DIAGNOSIS — I739 Peripheral vascular disease, unspecified: Secondary | ICD-10-CM

## 2014-06-16 DIAGNOSIS — E1059 Type 1 diabetes mellitus with other circulatory complications: Secondary | ICD-10-CM

## 2014-06-16 DIAGNOSIS — N390 Urinary tract infection, site not specified: Secondary | ICD-10-CM

## 2014-06-16 DIAGNOSIS — N189 Chronic kidney disease, unspecified: Secondary | ICD-10-CM

## 2014-06-16 DIAGNOSIS — I70209 Unspecified atherosclerosis of native arteries of extremities, unspecified extremity: Secondary | ICD-10-CM

## 2014-06-16 LAB — CBC WITH DIFFERENTIAL/PLATELET
BASOS PCT: 0.2 % (ref 0.0–3.0)
Basophils Absolute: 0 10*3/uL (ref 0.0–0.1)
Eosinophils Absolute: 0.1 10*3/uL (ref 0.0–0.7)
Eosinophils Relative: 1.9 % (ref 0.0–5.0)
HEMATOCRIT: 35.6 % — AB (ref 36.0–46.0)
HEMOGLOBIN: 11.3 g/dL — AB (ref 12.0–15.0)
LYMPHS ABS: 1.6 10*3/uL (ref 0.7–4.0)
Lymphocytes Relative: 23.9 % (ref 12.0–46.0)
MCHC: 31.7 g/dL (ref 30.0–36.0)
MCV: 87 fl (ref 78.0–100.0)
MONOS PCT: 11 % (ref 3.0–12.0)
Monocytes Absolute: 0.7 10*3/uL (ref 0.1–1.0)
NEUTROS ABS: 4.1 10*3/uL (ref 1.4–7.7)
Neutrophils Relative %: 63 % (ref 43.0–77.0)
Platelets: 184 10*3/uL (ref 150.0–400.0)
RBC: 4.09 Mil/uL (ref 3.87–5.11)
RDW: 20.8 % — ABNORMAL HIGH (ref 11.5–15.5)
WBC: 6.6 10*3/uL (ref 4.0–10.5)

## 2014-06-16 LAB — URINALYSIS, ROUTINE W REFLEX MICROSCOPIC
Nitrite: POSITIVE — AB
Specific Gravity, Urine: 1.01 (ref 1.000–1.030)
TOTAL PROTEIN, URINE-UPE24: 100 — AB
Urine Glucose: 100 — AB
Urobilinogen, UA: 4 — AB (ref 0.0–1.0)
pH: 8 (ref 5.0–8.0)

## 2014-06-16 LAB — BASIC METABOLIC PANEL
BUN: 39 mg/dL — AB (ref 6–23)
CO2: 21 mEq/L (ref 19–32)
CREATININE: 1.4 mg/dL — AB (ref 0.4–1.2)
Calcium: 10.1 mg/dL (ref 8.4–10.5)
Chloride: 104 mEq/L (ref 96–112)
GFR: 38.3 mL/min — ABNORMAL LOW (ref >60.00)
Glucose, Bld: 134 mg/dL — ABNORMAL HIGH (ref 70–99)
Potassium: 4.7 mEq/L (ref 3.5–5.1)
Sodium: 141 mEq/L (ref 135–145)

## 2014-06-16 LAB — TSH: TSH: 2.42 u[IU]/mL (ref 0.35–4.50)

## 2014-06-16 LAB — D-DIMER, QUANTITATIVE: D-Dimer, Quant: 1.24 ug/mL-FEU — ABNORMAL HIGH (ref 0.00–0.48)

## 2014-06-16 MED ORDER — CIPROFLOXACIN HCL 250 MG PO TABS: 250.0000 mg | ORAL_TABLET | Freq: Two times a day (BID) | ORAL | Status: DC

## 2014-06-16 MED ORDER — INSULIN LISPRO 100 UNIT/ML ~~LOC~~ SOLN: SUBCUTANEOUS | Status: DC

## 2014-06-16 NOTE — Telephone Encounter (Signed)
Critical lab on D-dimer- High @ 1.24...Emma Yu

## 2014-06-16 NOTE — Assessment & Plan Note (Signed)
I don't see any concerns for infection I am concerned about DVT and arterial insufficiency - will check a stat d-dimer today and will get a venous U/S and arterial flow study done Will cont to treat the venous stasis with vasculera

## 2014-06-16 NOTE — Telephone Encounter (Signed)
Rx sent to pharmacy   

## 2014-06-16 NOTE — Progress Notes (Signed)
Pre visit review using our clinic review tool, if applicable. No additional management support is needed unless otherwise documented below in the visit note. 

## 2014-06-16 NOTE — Assessment & Plan Note (Signed)
Will check her UA and urine clx Will empirically treat with cipro

## 2014-06-16 NOTE — Patient Instructions (Signed)

## 2014-06-16 NOTE — Progress Notes (Signed)
Subjective:    Patient ID: Emma Yu, female    DOB: [birth year], 80 y.o.   MRN: [id number]  Dysuria  This is a new problem. The current episode started in the past 7 days. The problem occurs intermittently. The problem has been unchanged. The quality of the pain is described as burning. The pain is at a severity of 1/10. The patient is experiencing no pain. There has been no fever. The fever has been present for less than 1 day. [name] not sexually active. There is no history of pyelonephritis. Associated symptoms include frequency and urgency. Pertinent negatives include no chills, discharge, flank pain, hematuria, hesitancy, nausea, sweats or vomiting. She has tried home medications for the symptoms. The treatment provided mild relief. Her past medical history is significant for recurrent UTIs. There is no history of catheterization, kidney stones, a single kidney, urinary stasis or a urological procedure.      Review of Systems  Constitutional: Negative.  Negative for fever, chills, diaphoresis, appetite change and fatigue.  HENT: Negative.   Eyes: Negative.   Respiratory: Negative.  Negative for cough, choking, chest tightness, shortness of breath and stridor.   Cardiovascular: Negative.  Negative for chest pain, palpitations and leg swelling.  Gastrointestinal: Negative.  Negative for nausea and vomiting.  Endocrine: Negative.   Genitourinary: Positive for dysuria, urgency and frequency. Negative for hesitancy, hematuria, flank pain, difficulty urinating and dyspareunia.  Musculoskeletal: Positive for arthralgias. Negative for back pain, gait problem, joint swelling, myalgias, neck pain and neck stiffness.       She complains of pain and swelling in her left calf area  Skin: Negative.  Negative for rash.  Allergic/Immunologic: Negative.   Neurological: Negative.   Hematological: Negative.  Negative for adenopathy. Does not bruise/bleed easily.  Psychiatric/Behavioral:  Negative.        Objective:   Physical Exam  Vitals reviewed. Constitutional: [name] oriented to person, place, and time. She appears well-developed and well-nourished.  Non-toxic appearance. She does not have a sickly appearance. She does not appear ill. No distress.  HENT:  Head: Normocephalic and atraumatic.  Mouth/Throat: Oropharynx is clear and moist. No oropharyngeal exudate.  Eyes: Conjunctivae are normal. Right eye exhibits no discharge. Left eye exhibits no discharge. No scleral icterus.  Neck: Normal range of motion. Neck supple. No JVD present. No tracheal deviation present. No thyromegaly present.  Cardiovascular: Normal rate, regular rhythm and intact distal pulses.  Exam reveals no gallop and no friction rub.   Murmur heard. Pulmonary/Chest: Effort normal and breath sounds normal. No stridor. No respiratory distress. She has no wheezes. She has no rales. She exhibits no tenderness.  Abdominal: Soft. Bowel sounds are normal. She exhibits no distension and no mass. There is no tenderness. There is no rebound and no guarding.  Musculoskeletal: Normal range of motion. She exhibits no edema and no tenderness.       Left lower leg: She exhibits swelling and deformity. She exhibits no tenderness, no bony tenderness and no laceration. Left lower leg edema: there is only trace edema.       Left foot: Normal. She exhibits normal range of motion, no tenderness, no bony tenderness, no swelling, normal capillary refill, no crepitus, no deformity and no laceration.  Left foot has good capillary refill The left calf is mildly and diffusely swollen and tender Over BLE anteriorly there is her baseline level of venous stasis with brawny induration and scale but there is no erythema or warmth. There  are no wounds and there is no weeping or exudate.  Lymphadenopathy:    She has no cervical adenopathy.  Neurological: [name] oriented to person, place, and time.  Skin: Skin is warm and dry. No rash  noted. [name] not diaphoretic. No erythema. No pallor.    Lab Results  Component Value Date   WBC 6.5 01/24/2012   HGB 12.7 01/24/2012   HCT 41.0 01/24/2012   PLT 186 01/24/2012   GLUCOSE 74 12/21/2013   CHOL 109 12/21/2013   TRIG 146.0 12/21/2013   HDL 50.40 12/21/2013   LDLCALC 29 12/21/2013   ALT 9 12/10/2011   AST 14 12/10/2011   NA 142 12/21/2013   K 4.4 12/21/2013   CL 103 12/21/2013   CREATININE 1.3* 12/21/2013   BUN 29* 12/21/2013   CO2 33* 12/21/2013   TSH 2.69 02/16/2013   INR 1.18 12/25/2011   HGBA1C 6.4 06/14/2014   MICROALBUR 2.2* 07/20/2013        Assessment & Plan:

## 2014-06-16 NOTE — Assessment & Plan Note (Signed)
I can't feel her pulses today but there is good capillary refill Will recheck an arterial flow study to see if there is ischemia

## 2014-06-16 NOTE — Telephone Encounter (Signed)
Patient would like her Novolog sent to [facility]   Thank you

## 2014-06-17 ENCOUNTER — Ambulatory Visit (HOSPITAL_COMMUNITY): Payer: Medicare Other | Attending: Cardiology | Admitting: Radiology

## 2014-06-17 ENCOUNTER — Telehealth: Payer: Self-pay | Admitting: Internal Medicine

## 2014-06-17 DIAGNOSIS — M79605 Pain in left leg: Secondary | ICD-10-CM | POA: Diagnosis not present

## 2014-06-17 DIAGNOSIS — E119 Type 2 diabetes mellitus without complications: Secondary | ICD-10-CM | POA: Diagnosis not present

## 2014-06-17 DIAGNOSIS — I739 Peripheral vascular disease, unspecified: Secondary | ICD-10-CM | POA: Insufficient documentation

## 2014-06-17 DIAGNOSIS — E785 Hyperlipidemia, unspecified: Secondary | ICD-10-CM | POA: Diagnosis not present

## 2014-06-17 DIAGNOSIS — I2510 Atherosclerotic heart disease of native coronary artery without angina pectoris: Secondary | ICD-10-CM | POA: Insufficient documentation

## 2014-06-17 DIAGNOSIS — M7989 Other specified soft tissue disorders: Secondary | ICD-10-CM

## 2014-06-17 DIAGNOSIS — Z87891 Personal history of nicotine dependence: Secondary | ICD-10-CM | POA: Diagnosis not present

## 2014-06-17 DIAGNOSIS — I10 Essential (primary) hypertension: Secondary | ICD-10-CM | POA: Insufficient documentation

## 2014-06-17 DIAGNOSIS — I70209 Unspecified atherosclerosis of native arteries of extremities, unspecified extremity: Secondary | ICD-10-CM

## 2014-06-17 DIAGNOSIS — L98499 Non-pressure chronic ulcer of skin of other sites with unspecified severity: Secondary | ICD-10-CM

## 2014-06-17 NOTE — Progress Notes (Signed)
Left lower extremity venous Duplex performed.

## 2014-06-17 NOTE — Telephone Encounter (Signed)
emmi emailed °

## 2014-06-18 ENCOUNTER — Ambulatory Visit (HOSPITAL_COMMUNITY): Payer: Medicare Other | Attending: Cardiology | Admitting: *Deleted

## 2014-06-18 ENCOUNTER — Encounter: Payer: Self-pay | Admitting: Internal Medicine

## 2014-06-18 DIAGNOSIS — E119 Type 2 diabetes mellitus without complications: Secondary | ICD-10-CM | POA: Diagnosis not present

## 2014-06-18 DIAGNOSIS — L98499 Non-pressure chronic ulcer of skin of other sites with unspecified severity: Secondary | ICD-10-CM

## 2014-06-18 DIAGNOSIS — E785 Hyperlipidemia, unspecified: Secondary | ICD-10-CM | POA: Insufficient documentation

## 2014-06-18 DIAGNOSIS — I10 Essential (primary) hypertension: Secondary | ICD-10-CM | POA: Insufficient documentation

## 2014-06-18 DIAGNOSIS — Z87891 Personal history of nicotine dependence: Secondary | ICD-10-CM | POA: Diagnosis not present

## 2014-06-18 DIAGNOSIS — I70209 Unspecified atherosclerosis of native arteries of extremities, unspecified extremity: Secondary | ICD-10-CM

## 2014-06-18 DIAGNOSIS — I2510 Atherosclerotic heart disease of native coronary artery without angina pectoris: Secondary | ICD-10-CM | POA: Diagnosis not present

## 2014-06-18 DIAGNOSIS — I739 Peripheral vascular disease, unspecified: Secondary | ICD-10-CM | POA: Insufficient documentation

## 2014-06-18 DIAGNOSIS — R0989 Other specified symptoms and signs involving the circulatory and respiratory systems: Secondary | ICD-10-CM | POA: Insufficient documentation

## 2014-06-18 NOTE — Progress Notes (Signed)
Arterial Doppler Lower Performed

## 2014-06-19 LAB — CULTURE, URINE COMPREHENSIVE

## 2014-06-27 ENCOUNTER — Inpatient Hospital Stay (HOSPITAL_COMMUNITY)
Admission: EM | Admit: 2014-06-27 | Discharge: 2014-07-04 | DRG: 292 | Disposition: A | Discharge status: Home Health | Payer: Medicare Other | Attending: Internal Medicine | Admitting: Internal Medicine

## 2014-06-27 ENCOUNTER — Emergency Department (HOSPITAL_COMMUNITY): Payer: Medicare Other

## 2014-06-27 ENCOUNTER — Encounter (HOSPITAL_COMMUNITY): Payer: Self-pay | Admitting: Emergency Medicine

## 2014-06-27 DIAGNOSIS — I252 Old myocardial infarction: Secondary | ICD-10-CM | POA: Diagnosis not present

## 2014-06-27 DIAGNOSIS — D649 Anemia, unspecified: Secondary | ICD-10-CM | POA: Diagnosis present

## 2014-06-27 DIAGNOSIS — I482 Chronic atrial fibrillation, unspecified: Secondary | ICD-10-CM | POA: Diagnosis present

## 2014-06-27 DIAGNOSIS — I080 Rheumatic disorders of both mitral and aortic valves: Secondary | ICD-10-CM | POA: Diagnosis present

## 2014-06-27 DIAGNOSIS — I2510 Atherosclerotic heart disease of native coronary artery without angina pectoris: Secondary | ICD-10-CM | POA: Diagnosis present

## 2014-06-27 DIAGNOSIS — I129 Hypertensive chronic kidney disease with stage 1 through stage 4 chronic kidney disease, or unspecified chronic kidney disease: Secondary | ICD-10-CM | POA: Diagnosis present

## 2014-06-27 DIAGNOSIS — Z833 Family history of diabetes mellitus: Secondary | ICD-10-CM | POA: Diagnosis not present

## 2014-06-27 DIAGNOSIS — N189 Chronic kidney disease, unspecified: Secondary | ICD-10-CM | POA: Diagnosis present

## 2014-06-27 DIAGNOSIS — I350 Nonrheumatic aortic (valve) stenosis: Secondary | ICD-10-CM

## 2014-06-27 DIAGNOSIS — R778 Other specified abnormalities of plasma proteins: Secondary | ICD-10-CM | POA: Diagnosis present

## 2014-06-27 DIAGNOSIS — E785 Hyperlipidemia, unspecified: Secondary | ICD-10-CM | POA: Diagnosis present

## 2014-06-27 DIAGNOSIS — E1051 Type 1 diabetes mellitus with diabetic peripheral angiopathy without gangrene: Secondary | ICD-10-CM | POA: Diagnosis present

## 2014-06-27 DIAGNOSIS — I872 Venous insufficiency (chronic) (peripheral): Secondary | ICD-10-CM | POA: Diagnosis present

## 2014-06-27 DIAGNOSIS — Z888 Allergy status to other drugs, medicaments and biological substances status: Secondary | ICD-10-CM

## 2014-06-27 DIAGNOSIS — N390 Urinary tract infection, site not specified: Secondary | ICD-10-CM | POA: Diagnosis present

## 2014-06-27 DIAGNOSIS — I4891 Unspecified atrial fibrillation: Secondary | ICD-10-CM | POA: Diagnosis present

## 2014-06-27 DIAGNOSIS — I5033 Acute on chronic diastolic (congestive) heart failure: Principal | ICD-10-CM | POA: Diagnosis present

## 2014-06-27 DIAGNOSIS — Z7982 Long term (current) use of aspirin: Secondary | ICD-10-CM

## 2014-06-27 DIAGNOSIS — Z794 Long term (current) use of insulin: Secondary | ICD-10-CM | POA: Diagnosis not present

## 2014-06-27 DIAGNOSIS — I509 Heart failure, unspecified: Secondary | ICD-10-CM

## 2014-06-27 DIAGNOSIS — R0602 Shortness of breath: Secondary | ICD-10-CM | POA: Diagnosis present

## 2014-06-27 DIAGNOSIS — R7989 Other specified abnormal findings of blood chemistry: Secondary | ICD-10-CM | POA: Diagnosis present

## 2014-06-27 DIAGNOSIS — Z9119 Patient's noncompliance with other medical treatment and regimen: Secondary | ICD-10-CM | POA: Diagnosis present

## 2014-06-27 DIAGNOSIS — Z87891 Personal history of nicotine dependence: Secondary | ICD-10-CM | POA: Diagnosis not present

## 2014-06-27 DIAGNOSIS — Z96641 Presence of right artificial hip joint: Secondary | ICD-10-CM | POA: Diagnosis present

## 2014-06-27 DIAGNOSIS — I10 Essential (primary) hypertension: Secondary | ICD-10-CM | POA: Diagnosis present

## 2014-06-27 DIAGNOSIS — E109 Type 1 diabetes mellitus without complications: Secondary | ICD-10-CM | POA: Diagnosis present

## 2014-06-27 DIAGNOSIS — N179 Acute kidney failure, unspecified: Secondary | ICD-10-CM | POA: Diagnosis present

## 2014-06-27 DIAGNOSIS — I739 Peripheral vascular disease, unspecified: Secondary | ICD-10-CM | POA: Diagnosis present

## 2014-06-27 LAB — CBC WITH DIFFERENTIAL/PLATELET
BASOS PCT: 0 % (ref 0–1)
Basophils Absolute: 0 10*3/uL (ref 0.0–0.1)
EOS ABS: 0.1 10*3/uL (ref 0.0–0.7)
EOS PCT: 1 % (ref 0–5)
HCT: 35.7 % — ABNORMAL LOW (ref 36.0–46.0)
HEMOGLOBIN: 10.7 g/dL — AB (ref 12.0–15.0)
LYMPHS ABS: 0.8 10*3/uL (ref 0.7–4.0)
Lymphocytes Relative: 14 % (ref 12–46)
MCH: 26.7 pg (ref 26.0–34.0)
MCHC: 30 g/dL (ref 30.0–36.0)
MCV: 89 fL (ref 78.0–100.0)
MONO ABS: 0.6 10*3/uL (ref 0.1–1.0)
MONOS PCT: 11 % (ref 3–12)
NEUTROS PCT: 74 % (ref 43–77)
Neutro Abs: 4.1 10*3/uL (ref 1.7–7.7)
Platelets: 144 10*3/uL — ABNORMAL LOW (ref 150–400)
RBC: 4.01 MIL/uL (ref 3.87–5.11)
RDW: 19.8 % — ABNORMAL HIGH (ref 11.5–15.5)
WBC: 5.6 10*3/uL (ref 4.0–10.5)

## 2014-06-27 LAB — COMPREHENSIVE METABOLIC PANEL
ALBUMIN: 3.6 g/dL (ref 3.5–5.2)
ALT: 23 U/L (ref 0–35)
AST: 28 U/L (ref 0–37)
Alkaline Phosphatase: 109 U/L (ref 39–117)
Anion gap: 14 (ref 5–15)
BUN: 21 mg/dL (ref 6–23)
CALCIUM: 10.1 mg/dL (ref 8.4–10.5)
CO2: 23 mEq/L (ref 19–32)
CREATININE: 1.03 mg/dL (ref 0.50–1.10)
Chloride: 104 mEq/L (ref 96–112)
GFR calc Af Amer: 56 mL/min — ABNORMAL LOW (ref >90)
GFR calc non Af Amer: 48 mL/min — ABNORMAL LOW (ref >90)
GLUCOSE: 196 mg/dL — AB (ref 70–99)
Potassium: 4.3 mEq/L (ref 3.7–5.3)
Sodium: 141 mEq/L (ref 137–147)
TOTAL PROTEIN: 7.9 g/dL (ref 6.0–8.3)
Total Bilirubin: 1.4 mg/dL — ABNORMAL HIGH (ref 0.3–1.2)

## 2014-06-27 LAB — PRO B NATRIURETIC PEPTIDE: Pro B Natriuretic peptide (BNP): 1644 pg/mL — ABNORMAL HIGH (ref 0–450)

## 2014-06-27 LAB — I-STAT TROPONIN, ED: TROPONIN I, POC: 0.03 ng/mL (ref 0.00–0.08)

## 2014-06-27 MED ORDER — FUROSEMIDE 10 MG/ML IJ SOLN
60.0000 mg | Freq: Once | INTRAMUSCULAR | Status: AC
Start: 2014-06-27 — End: 2014-06-27
  Administered 2014-06-27: 60 mg via INTRAVENOUS
  Filled 2014-06-27: qty 6

## 2014-06-27 MED ORDER — NITROGLYCERIN 2 % TD OINT
1.0000 [in_us] | TOPICAL_OINTMENT | Freq: Once | TRANSDERMAL | Status: AC
  Administered 2014-06-27: 1 [in_us] via TOPICAL
  Filled 2014-06-27: qty 1

## 2014-06-27 NOTE — ED Notes (Signed)
MD Audie Pinto at bedside and requested patient be placed on Bi-pap. RT called to start.

## 2014-06-27 NOTE — H&P (Addendum)
Emma Yu is an 80 y.o. female.    Pcp:  [name]  Cardiologist:  Jenkins Rouge  Chief Complaint: sob HPI: 80 yo female with hx of CAD , CHF (EF 55-60%), AS, MS, Dm2 c/o sob for a couple of weeks.  Pt notes that lasix stopped about 2 weeks ago.  + lower ext edema. Wt has been stable.  Pt denies orthopnea, pnd. Pt states that bp slightly high at home.  Slight chest tightness, right and left sided.  Denies fever, chills, cough, palp, n/v, diarrhea, brbpr, black stool. Pt presented to ED due to sob, and CXR showed mild CHF.  Bnp elevated.  Trop negative.  Pt will be admitted for CHF.    Past Medical History  Diagnosis Date  . PAD (peripheral artery disease)     s/p bilateral comon iliac artery stenting in 2002. Known significant  R SFA  disease. carotid dz noted 11/2011 followed by Dr. Bridgett Larsson  . Diabetes mellitus   . Hypertension   . Hyperlipidemia   . Venous insufficiency   . Chronic diastolic heart failure     Echocardiogram 12/10/11: Moderate LVH, EF 65-70%, moderate aortic stenosis, mean gradient 20, mild MS  . Atrial fibrillation     not a coumadin candidate secondary to fall risk  . DJD (degenerative joint disease) of hip     s/p R THR 10/2010  . Aortic stenosis     Moderate by echo 4/13 - mean 20 mmHg  . Mitral stenosis     Mild by echo 4/13  . Anemia     iron deficient  . Acute respiratory failure     12/2011 admission - decreased O2 sats on ambulation, improved by time of discharge  . CAD (coronary artery disease)     minimal plaque by cath 5/12  . CHF (congestive heart failure)     Past Surgical History  Procedure Laterality Date  . Total hip arthroplasty  1991, 1994    redo in 1994  . Lumbar disc surgery  1999  . Cardiac catheterization  01/10/2011    No significant CAD  . Cholecystectomy    . Abdominal hysterectomy    . Cataract extraction  2015    Family History  Problem Relation Age of Onset  . Diabetes Father   . Diabetes Other     5/8 sibs    Social History:  reports that she quit smoking about 28 years ago. Her smoking use included Cigarettes. She has a 50 pack-year smoking history. She has never used smokeless tobacco. She reports that she does not drink alcohol or use illicit drugs.  Allergies:  Allergies  Allergen Reactions  . Carvedilol Other (See Comments)    Heart stops  . Clindamycin/Lincomycin Other (See Comments)    tremors  . Diltiazem Hcl Other (See Comments)    Chest pain  . Fenofibrate Other (See Comments)    Unknown - [facility] - MAR  . Hctz [Hydrochlorothiazide] Other (See Comments)    Chest pain  . Nisoldipine Other (See Comments)    Chest pain  . Nitroglycerin Other (See Comments)    Unknown - [facility] MAR  . Propranolol Diarrhea    Chest pain  . Septra [Sulfamethoxazole-Trimethoprim]     Rash, cyst  . Patrici Ranks Fumarate] Other (See Comments)    Unknown reaction  . Valturna [Aliskiren-Valsartan] Other (See Comments)    Unknown reaction     (Not in a hospital admission)  Results for orders placed or performed  during the hospital encounter of 06/27/14 (from the past 48 hour(s))  CBC with Differential     Status: Abnormal   Collection Time: 06/27/14  8:05 PM  Result Value Ref Range   WBC 5.6 4.0 - 10.5 K/uL   RBC 4.01 3.87 - 5.11 MIL/uL   Hemoglobin 10.7 (L) 12.0 - 15.0 g/dL   HCT 35.7 (L) 36.0 - 46.0 %   MCV 89.0 78.0 - 100.0 fL   MCH 26.7 26.0 - 34.0 pg   MCHC 30.0 30.0 - 36.0 g/dL   RDW 19.8 (H) 11.5 - 15.5 %   Platelets 144 (L) 150 - 400 K/uL   Neutrophils Relative % 74 43 - 77 %   Neutro Abs 4.1 1.7 - 7.7 K/uL   Lymphocytes Relative 14 12 - 46 %   Lymphs Abs 0.8 0.7 - 4.0 K/uL   Monocytes Relative 11 3 - 12 %   Monocytes Absolute 0.6 0.1 - 1.0 K/uL   Eosinophils Relative 1 0 - 5 %   Eosinophils Absolute 0.1 0.0 - 0.7 K/uL   Basophils Relative 0 0 - 1 %   Basophils Absolute 0.0 0.0 - 0.1 K/uL  Comprehensive metabolic panel     Status: Abnormal   Collection Time: 06/27/14  8:05 PM   Result Value Ref Range   Sodium 141 137 - 147 mEq/L   Potassium 4.3 3.7 - 5.3 mEq/L   Chloride 104 96 - 112 mEq/L   CO2 23 19 - 32 mEq/L   Glucose, Bld 196 (H) 70 - 99 mg/dL   BUN 21 6 - 23 mg/dL   Creatinine, Ser 1.03 0.50 - 1.10 mg/dL   Calcium 10.1 8.4 - 10.5 mg/dL   Total Protein 7.9 6.0 - 8.3 g/dL   Albumin 3.6 3.5 - 5.2 g/dL   AST 28 0 - 37 U/L   ALT 23 0 - 35 U/L   Alkaline Phosphatase 109 39 - 117 U/L   Total Bilirubin 1.4 (H) 0.3 - 1.2 mg/dL   GFR calc non Af Amer 48 (L) >90 mL/min   GFR calc Af Amer 56 (L) >90 mL/min    Comment: (NOTE) The eGFR has been calculated using the CKD EPI equation. This calculation has not been validated in all clinical situations. eGFR's persistently <90 mL/min signify possible Chronic Kidney Disease.    Anion gap 14 5 - 15  Pro b natriuretic peptide     Status: Abnormal   Collection Time: 06/27/14  8:30 PM  Result Value Ref Range   Pro B Natriuretic peptide (BNP) 1644.0 (H) 0 - 450 pg/mL  I-Stat Troponin, ED (not at [facility])     Status: None   Collection Time: 06/27/14  8:41 PM  Result Value Ref Range   Troponin i, poc 0.03 0.00 - 0.08 ng/mL   Comment 3            Comment: Due to the release kinetics of cTnI, a negative result within the first hours of the onset of symptoms does not rule out myocardial infarction with certainty. If myocardial infarction is still suspected, repeat the test at appropriate intervals.    Dg Chest 1 View  06/27/2014   CLINICAL DATA:  SOB, Hx of HTN, diabetes. Went to urgent care in [place] for a large blister on left lower leg, doctor told her if she got SOB to go to ER  EXAM: CHEST - 1 VIEW  COMPARISON:  04/19/2014  FINDINGS: Cardiac silhouette is mildly enlarged. No mediastinal or  hilar masses. There is vascular congestion and mild interstitial thickening most evident in the lung bases. Mild hazy opacity is also noted in both lung bases, likely atelectasis. No evidence of pneumonia.  Bony thorax is  demineralized but grossly intact.  IMPRESSION: 1. Mild congestive heart failure.   Electronically Signed   By: Lajean Manes M.D.   On: 06/27/2014 21:02    Review of Systems  Constitutional: Negative for fever, chills, weight loss, malaise/fatigue and diaphoresis.  HENT: Negative for congestion, ear discharge, ear pain, hearing loss, nosebleeds, sore throat and tinnitus.   Eyes: Negative for blurred vision, double vision, photophobia, pain, discharge and redness.  Respiratory: Positive for shortness of breath. Negative for cough, hemoptysis, sputum production, wheezing and stridor.   Cardiovascular: Positive for chest pain and leg swelling. Negative for palpitations, orthopnea, claudication and PND.  Gastrointestinal: Negative for heartburn, nausea, vomiting, abdominal pain, diarrhea, constipation, blood in stool and melena.  Genitourinary: Negative for dysuria, urgency, frequency, hematuria and flank pain.  Musculoskeletal: Negative for myalgias, back pain, joint pain, falls and neck pain.  Skin: Negative for itching and rash.  Neurological: Negative for dizziness, tingling, tremors, sensory change, speech change, focal weakness, seizures, loss of consciousness, weakness and headaches.  Endo/Heme/Allergies: Negative for environmental allergies and polydipsia. Does not bruise/bleed easily.  Psychiatric/Behavioral: Negative for depression, suicidal ideas, hallucinations, memory loss and substance abuse. The patient is not nervous/anxious and does not have insomnia.     Blood pressure 186/64, pulse 88, temperature 98.6 F (37 C), resp. rate 35, SpO2 89 %. Physical Exam  Constitutional: [name] oriented to person, place, and time. She appears well-developed and well-nourished.  HENT:  Head: Normocephalic and atraumatic.  Eyes: Conjunctivae and EOM are normal. Pupils are equal, round, and reactive to light.  Neck: Normal range of motion. Neck supple. JVD present. No tracheal deviation present. No  thyromegaly present.  Cardiovascular: Normal rate and regular rhythm.  Exam reveals no gallop and no friction rub.   Murmur heard. Respiratory: Effort normal. No stridor. No respiratory distress. She has no wheezes. She has rales.  GI: Soft. Bowel sounds are normal. She exhibits no distension. There is no tenderness. There is no rebound and no guarding.  Musculoskeletal: Normal range of motion. She exhibits edema. She exhibits no tenderness.  Lymphadenopathy:    She has no cervical adenopathy.  Neurological: [name] alert and oriented to person, place, and time. She has normal reflexes. She displays normal reflexes. No cranial nerve deficit. She exhibits normal muscle tone. Coordination normal.  Skin: Skin is warm and dry. No rash noted. No erythema. No pallor.  Psychiatric: She has a normal mood and affect. Her behavior is normal. Judgment and thought content normal.     Assessment/Plan Dyspnea secondary to CHF (EF 55-60%) We will diurese the patient with iv lasix.  Initially the ED used bipap, but the patient doesn't seem to be in respiratory distress at the present time We will cont with ARB for after load reduction, and give one time dose of hydralazine to improve bp since having hypertensive urgency. We will add nitropaste as well .  Continue with metoprolol.   Cycle cardiac markers, check tsh, check cardiac 2D echo Consider cardiolgy consulation in am  Hypertension: Add nitropaste, and continue with metoprolol, diovan or substitute  Dm2:  D/c insulin pump for now, and use sliding scale insulin,   CAD:  Continue with aspirin, metoprolol, statin  Anemia: check cbc in am  Jani Gravel 06/27/2014, 10:37 PM

## 2014-06-27 NOTE — ED Notes (Signed)
Patient transported to X-ray 

## 2014-06-27 NOTE — Progress Notes (Signed)
Patient was given Lasix, lung sounds improved bilateraly. Spo2 at 96%.  Patient states that she is feeling better, and requested no BiPAP at this time.  RT will continue to monitor.

## 2014-06-27 NOTE — ED Notes (Signed)
MD at bedside. 

## 2014-06-27 NOTE — ED Notes (Signed)
Pt. reports progressing  exertional dyspnea onset this evening , denies cough or congestion , no fever or chills , also presents with skin blister at left anterior shin for several days - denies burn or injury.

## 2014-06-27 NOTE — ED Provider Notes (Signed)
CSN: [id number]     Arrival date & time 06/27/14  1953 History   First MD Initiated Contact with Patient 06/27/14 2001     Chief Complaint  Patient presents with  . Shortness of Breath   Emma Yu is a 80yo female with significant PAD, CAD, chronic diastolic CHF and multiple valve cardiac disease brought in by her son for worsening shortness of breath. She describes worsening dyspnea gradually for the past several days causing her to need to sleep in a chair, worse on exertion and associated with chest tightness on exertion. Resting alleviates the symptoms. She was walking to her house tonight and felt she needed to come in due to the worsened dyspnea.   She has been admitted for heart failure exacerbation several years ago and reports a history of a heart attack. Takes no inhaled medications.   Extensive smoking history. No recent illness with the exception of UTI being treated currently with cipro (2 doses remaining).  (Consider location/radiation/quality/duration/timing/severity/associated sxs/prior Treatment) HPI  Past Medical History  Diagnosis Date  . PAD (peripheral artery disease)     s/p bilateral comon iliac artery stenting in 2002. Known significant  R SFA  disease. carotid dz noted 11/2011 followed by Dr. Bridgett Larsson  . Diabetes mellitus   . Hypertension   . Hyperlipidemia   . Venous insufficiency   . Chronic diastolic heart failure     Echocardiogram 12/10/11: Moderate LVH, EF 65-70%, moderate aortic stenosis, mean gradient 20, mild MS  . Atrial fibrillation     not a coumadin candidate secondary to fall risk  . DJD (degenerative joint disease) of hip     s/p R THR 10/2010  . Aortic stenosis     Moderate by echo 4/13 - mean 20 mmHg  . Mitral stenosis     Mild by echo 4/13  . Anemia     iron deficient  . Acute respiratory failure     12/2011 admission - decreased O2 sats on ambulation, improved by time of discharge  . CAD (coronary artery disease)     minimal plaque  by cath 5/12   Past Surgical History  Procedure Laterality Date  . Total hip arthroplasty  1991, 1994    redo in 1994  . Lumbar disc surgery  1999  . Cardiac catheterization  01/10/2011    No significant CAD  . Cholecystectomy    . Abdominal hysterectomy     Family History  Problem Relation Age of Onset  . Diabetes Father   . Diabetes Other     5/8 sibs   History  Substance Use Topics  . Smoking status: Former Smoker -- 2.00 packs/day for 25 years    Types: Cigarettes    Quit date: 08/20/1985  . Smokeless tobacco: Never Used     Comment: Retired-widowed 71, lives with son  . Alcohol Use: No   OB History    No data available     Review of Systems  Constitutional: Negative for fever, chills and diaphoresis.  HENT: Negative for congestion, rhinorrhea and sore throat.   Respiratory: Positive for chest tightness and shortness of breath. Negative for cough and wheezing.   Cardiovascular: Positive for leg swelling. Negative for chest pain and palpitations.  Gastrointestinal: Negative for nausea, vomiting and diarrhea.  Genitourinary: Positive for frequency. Negative for dysuria and urgency.  Hematological: Does not bruise/bleed easily.      Allergies  Carvedilol; Clindamycin/lincomycin; Diltiazem hcl; Fenofibrate; Hctz; Nisoldipine; Nitroglycerin; Propranolol; Septra; Marisa Severin; and Melynda Keller  Home Medications   Prior to Admission medications   Medication Sig Start Date End Date Taking? Authorizing Provider  Ascorbic Acid (VITAMIN C) 500 MG tablet Take 500 mg by mouth daily.      Historical Provider, MD  aspirin EC 81 MG tablet Take 81 mg by mouth 2 (two) times daily.    Historical Provider, MD  b complex vitamins tablet Take 1 tablet by mouth daily.      Historical Provider, MD  Cholecalciferol (VITAMIN D3) 2000 UNITS capsule Take 2,000 Units by mouth daily.      Historical Provider, MD  ciprofloxacin (CIPRO) 250 MG tablet Take 1 tablet (250 mg total) by mouth 2 (two)  times daily. 06/16/14   Janith Lima, MD  cloNIDine (CATAPRES) 0.2 MG tablet TAKE 1 TABLET BY MOUTH QD 05/05/14   Janith Lima, MD  fish oil-omega-3 fatty acids 1000 MG capsule Take 2 g by mouth daily.      Historical Provider, MD  furosemide (LASIX) 40 MG tablet TAKING 1/2 TAB QD 05/05/14   Janith Lima, MD  gabapentin (NEURONTIN) 300 MG capsule TAKE 2 CAPSULES BY MOUTH AT BEDTIME 04/05/14   Janith Lima, MD  Insulin Infusion Pump Supplies (INSET INFUSION SET 23" 6MM) MISC 1 Device by Does not apply route every 3 (three) days.    Historical Provider, MD  Insulin Infusion Pump Supplies (INSULIN PUMP SYRINGE RESERVOIR) MISC 1 Device by Does not apply route every 3 (three) days. [place] 2 ml    Historical Provider, MD  insulin lispro (HUMALOG) 100 UNIT/ML injection For use in pump, total of 70 units per day 06/16/14   Renato Shin, MD  metoprolol (LOPRESSOR) 50 MG tablet Take 1 tablet (50 mg total) by mouth 2 (two) times daily. 04/19/14   Janith Lima, MD  omeprazole (PRILOSEC) 40 MG capsule Take 40 mg by mouth daily before breakfast.  10/31/11   Janith Lima, MD  pravastatin (PRAVACHOL) 20 MG tablet TAKE 1 TABLET BY MOUTH IN THE EVENING 06/08/14   Janith Lima, MD  Probiotic Product (PROBIOTIC PO) Take 1 tablet by mouth daily at 10 pm.    Historical Provider, MD  traMADol (ULTRAM) 50 MG tablet TAKE 2 TABLETS BY MOUTH EVERY 6 HOURS AS NEEDED FOR PAIN    Janith Lima, MD  valsartan (DIOVAN) 320 MG tablet Take 320 mg by mouth daily.    Historical Provider, MD   BP 194/81 mmHg  Pulse 72  Temp(Src) 98.6 F (37 C)  Resp 18  SpO2 95% Physical Exam  Constitutional: [name] oriented to person, place, and time. No distress.  Cardiovascular: Exam reveals no gallop.   Murmur (II/VI systolic decrescendo murmur at right sternal border) heard. Irregularly irregular S1 S2 with rate around 70-80  Pulmonary/Chest: Effort normal. No respiratory distress. She has no wheezes.  Distant breath sounds  without focal consolidation. Fine rales at both bases.  Abdominal: Soft. Bowel sounds are normal. She exhibits no distension. There is no tenderness.  Musculoskeletal: Normal range of motion. She exhibits edema (LE).  Neurological: [name] alert and oriented to person, place, and time. She exhibits normal muscle tone.  Skin: Skin is warm and dry. Rash (Prominent stasis dermatitis bilateral LE's with bulla on left anterior shin draining serous fluid without erythema, purulence) noted. [name] not diaphoretic.  Nursing note and vitals reviewed.   ED Course  Procedures (including critical care time) Labs Review Labs Reviewed  PRO B NATRIURETIC PEPTIDE  CBC WITH DIFFERENTIAL  COMPREHENSIVE METABOLIC PANEL  I-STAT TROPOININ, ED    Imaging Review No results found.   EKG Interpretation   Date/Time:  Sunday June 27 2014 20:02:16 EST Ventricular Rate:  77 PR Interval:  59 QRS Duration: 108 QT Interval:  401 QTC Calculation: 106 R Axis:   -71 Text Interpretation:  Atrial fibrillation Baseline wander Abnormal ekg  Confirmed by BEATON  MD, ROBERT ([id number]) on 06/27/2014 8:14:16 PM      MDM   Final diagnoses:  CHF (congestive heart failure)   Elderly female with extensive cardiac history with signs and symptoms of CHF exacerbation. Requiring 3L oxygen on arrival to maintain saturations >90%. Chronic leg wound without signs of infection. UTI being treated with cipro, though culture from 10/28 grew E. coli resistant to quinolones.   - CXR - BNP - troponin - Consider switch to augmentin (sensitive on urine culture)     Dot Lanes, MD 07/19/14 1900

## 2014-06-27 NOTE — ED Notes (Signed)
Patient returned from X-ray 

## 2014-06-28 ENCOUNTER — Other Ambulatory Visit: Payer: Self-pay | Admitting: Internal Medicine

## 2014-06-28 DIAGNOSIS — I359 Nonrheumatic aortic valve disorder, unspecified: Secondary | ICD-10-CM

## 2014-06-28 DIAGNOSIS — E1059 Type 1 diabetes mellitus with other circulatory complications: Secondary | ICD-10-CM

## 2014-06-28 DIAGNOSIS — I10 Essential (primary) hypertension: Secondary | ICD-10-CM

## 2014-06-28 DIAGNOSIS — I5031 Acute diastolic (congestive) heart failure: Secondary | ICD-10-CM

## 2014-06-28 DIAGNOSIS — I739 Peripheral vascular disease, unspecified: Secondary | ICD-10-CM

## 2014-06-28 LAB — HEMOGLOBIN A1C
Hgb A1c MFr Bld: 6.7 % — ABNORMAL HIGH (ref <5.7)
Mean Plasma Glucose: 146 mg/dL — ABNORMAL HIGH (ref <117)

## 2014-06-28 LAB — URINALYSIS, ROUTINE W REFLEX MICROSCOPIC
BILIRUBIN URINE: NEGATIVE
Glucose, UA: NEGATIVE mg/dL
Ketones, ur: NEGATIVE mg/dL
Leukocytes, UA: NEGATIVE
Nitrite: NEGATIVE
PH: 7 (ref 5.0–8.0)
PROTEIN: NEGATIVE mg/dL
Specific Gravity, Urine: 1.007 (ref 1.005–1.030)
Urobilinogen, UA: 0.2 mg/dL (ref 0.0–1.0)

## 2014-06-28 LAB — TROPONIN I
TROPONIN I: 0.66 ng/mL — AB (ref <0.30)
TROPONIN I: 0.52 ng/mL — AB (ref <0.30)
Troponin I: <0.3 ng/mL (ref <0.30)
Troponin I: 0.33 ng/mL (ref <0.30)

## 2014-06-28 LAB — GLUCOSE, CAPILLARY
GLUCOSE-CAPILLARY: 221 mg/dL — AB (ref 70–99)
GLUCOSE-CAPILLARY: 217 mg/dL — AB (ref 70–99)
Glucose-Capillary: 167 mg/dL — ABNORMAL HIGH (ref 70–99)
Glucose-Capillary: 167 mg/dL — ABNORMAL HIGH (ref 70–99)
Glucose-Capillary: 191 mg/dL — ABNORMAL HIGH (ref 70–99)

## 2014-06-28 LAB — URINE MICROSCOPIC-ADD ON

## 2014-06-28 LAB — TSH: TSH: 0.805 u[IU]/mL (ref 0.350–4.500)

## 2014-06-28 MED ORDER — INSULIN ASPART 100 UNIT/ML ~~LOC~~ SOLN
6.0000 [IU] | Freq: Three times a day (TID) | SUBCUTANEOUS | Status: DC
  Administered 2014-06-28: 6 [IU] via SUBCUTANEOUS

## 2014-06-28 MED ORDER — INSULIN ASPART 100 UNIT/ML ~~LOC~~ SOLN
0.0000 [IU] | Freq: Three times a day (TID) | SUBCUTANEOUS | Status: DC
  Administered 2014-06-28: 2 [IU] via SUBCUTANEOUS
  Administered 2014-06-28 (×2): 3 [IU] via SUBCUTANEOUS

## 2014-06-28 MED ORDER — ACETAMINOPHEN 650 MG RE SUPP: 650.0000 mg | Freq: Four times a day (QID) | RECTAL | Status: DC | PRN

## 2014-06-28 MED ORDER — GABAPENTIN 300 MG PO CAPS
300.0000 mg | ORAL_CAPSULE | Freq: Every day | ORAL | Status: DC
  Administered 2014-06-28: 300 mg via ORAL
  Filled 2014-06-28 (×2): qty 1

## 2014-06-28 MED ORDER — VITAMIN D 1000 UNITS PO TABS
2000.0000 [IU] | ORAL_TABLET | Freq: Every day | ORAL | Status: DC
  Administered 2014-06-28 – 2014-07-04 (×7): 2000 [IU] via ORAL
  Filled 2014-06-28 (×7): qty 2

## 2014-06-28 MED ORDER — ENOXAPARIN SODIUM 30 MG/0.3ML ~~LOC~~ SOLN
30.0000 mg | Freq: Once | SUBCUTANEOUS | Status: AC
  Administered 2014-06-28: 30 mg via SUBCUTANEOUS
  Filled 2014-06-28: qty 0.3

## 2014-06-28 MED ORDER — ACETAMINOPHEN 325 MG PO TABS
650.0000 mg | ORAL_TABLET | Freq: Four times a day (QID) | ORAL | Status: DC | PRN
  Administered 2014-06-28 (×2): 650 mg via ORAL
  Filled 2014-06-28 (×2): qty 2

## 2014-06-28 MED ORDER — SODIUM CHLORIDE 0.9 % IJ SOLN
3.0000 mL | Freq: Two times a day (BID) | INTRAMUSCULAR | Status: DC
  Administered 2014-06-30 – 2014-07-03 (×4): 3 mL via INTRAVENOUS

## 2014-06-28 MED ORDER — SODIUM CHLORIDE 0.9 % IV SOLN: 250.0000 mL | INTRAVENOUS | Status: DC | PRN

## 2014-06-28 MED ORDER — GABAPENTIN 300 MG PO CAPS
600.0000 mg | ORAL_CAPSULE | Freq: Every day | ORAL | Status: DC
  Administered 2014-06-28 – 2014-07-03 (×6): 600 mg via ORAL
  Filled 2014-06-28 (×7): qty 2

## 2014-06-28 MED ORDER — INSULIN ASPART 100 UNIT/ML ~~LOC~~ SOLN: 0.0000 [IU] | Freq: Every day | SUBCUTANEOUS | Status: DC

## 2014-06-28 MED ORDER — ENOXAPARIN SODIUM 80 MG/0.8ML ~~LOC~~ SOLN
65.0000 mg | SUBCUTANEOUS | Status: DC
  Administered 2014-06-29 – 2014-06-30 (×2): 65 mg via SUBCUTANEOUS
  Filled 2014-06-28 (×3): qty 0.8

## 2014-06-28 MED ORDER — PANTOPRAZOLE SODIUM 40 MG PO TBEC
40.0000 mg | DELAYED_RELEASE_TABLET | Freq: Every day | ORAL | Status: DC
  Administered 2014-06-28 – 2014-07-03 (×6): 40 mg via ORAL
  Filled 2014-06-28 (×6): qty 1

## 2014-06-28 MED ORDER — SODIUM CHLORIDE 0.9 % IJ SOLN
3.0000 mL | Freq: Two times a day (BID) | INTRAMUSCULAR | Status: DC
  Administered 2014-06-28 – 2014-07-04 (×11): 3 mL via INTRAVENOUS

## 2014-06-28 MED ORDER — METOPROLOL TARTRATE 50 MG PO TABS
50.0000 mg | ORAL_TABLET | Freq: Two times a day (BID) | ORAL | Status: DC
  Administered 2014-06-28 (×3): 50 mg via ORAL
  Filled 2014-06-28 (×5): qty 1

## 2014-06-28 MED ORDER — PRAVASTATIN SODIUM 20 MG PO TABS
20.0000 mg | ORAL_TABLET | Freq: Every day | ORAL | Status: DC
  Administered 2014-06-28 – 2014-07-04 (×7): 20 mg via ORAL
  Filled 2014-06-28 (×7): qty 1

## 2014-06-28 MED ORDER — INSULIN ASPART 100 UNIT/ML ~~LOC~~ SOLN
4.0000 [IU] | Freq: Three times a day (TID) | SUBCUTANEOUS | Status: DC
  Administered 2014-06-28 (×2): 4 [IU] via SUBCUTANEOUS

## 2014-06-28 MED ORDER — NITROGLYCERIN 2 % TD OINT
1.0000 [in_us] | TOPICAL_OINTMENT | Freq: Three times a day (TID) | TRANSDERMAL | Status: DC
  Administered 2014-06-28 (×2): 1 [in_us] via TOPICAL
  Filled 2014-06-28: qty 30

## 2014-06-28 MED ORDER — HYDRALAZINE HCL 20 MG/ML IJ SOLN
10.0000 mg | Freq: Four times a day (QID) | INTRAMUSCULAR | Status: DC | PRN
  Administered 2014-06-28 – 2014-06-29 (×2): 10 mg via INTRAVENOUS
  Filled 2014-06-28 (×2): qty 1

## 2014-06-28 MED ORDER — ENOXAPARIN SODIUM 30 MG/0.3ML ~~LOC~~ SOLN
30.0000 mg | SUBCUTANEOUS | Status: DC
  Administered 2014-06-28: 30 mg via SUBCUTANEOUS
  Filled 2014-06-28: qty 0.3

## 2014-06-28 MED ORDER — OMEGA-3-ACID ETHYL ESTERS 1 G PO CAPS
1.0000 g | ORAL_CAPSULE | Freq: Two times a day (BID) | ORAL | Status: DC
  Administered 2014-06-28 – 2014-07-04 (×13): 1 g via ORAL
  Filled 2014-06-28 (×15): qty 1

## 2014-06-28 MED ORDER — IRBESARTAN 300 MG PO TABS
300.0000 mg | ORAL_TABLET | Freq: Every day | ORAL | Status: DC
  Administered 2014-06-28 – 2014-06-29 (×2): 300 mg via ORAL
  Filled 2014-06-28 (×3): qty 1

## 2014-06-28 MED ORDER — TRAMADOL HCL 50 MG PO TABS
100.0000 mg | ORAL_TABLET | Freq: Four times a day (QID) | ORAL | Status: DC | PRN
  Administered 2014-07-03: 100 mg via ORAL
  Filled 2014-06-28: qty 2

## 2014-06-28 MED ORDER — OMEGA-3 FATTY ACIDS 1000 MG PO CAPS: 2.0000 g | ORAL_CAPSULE | Freq: Two times a day (BID) | ORAL | Status: DC

## 2014-06-28 MED ORDER — INSULIN ASPART 100 UNIT/ML ~~LOC~~ SOLN: 4.0000 [IU] | Freq: Three times a day (TID) | SUBCUTANEOUS | Status: DC

## 2014-06-28 MED ORDER — RISAQUAD PO CAPS
ORAL_CAPSULE | Freq: Every day | ORAL | Status: DC
  Administered 2014-06-28 – 2014-07-03 (×7): 1 via ORAL
  Filled 2014-06-28 (×8): qty 1

## 2014-06-28 MED ORDER — ASPIRIN EC 81 MG PO TBEC
81.0000 mg | DELAYED_RELEASE_TABLET | Freq: Two times a day (BID) | ORAL | Status: DC
  Administered 2014-06-28 – 2014-07-04 (×14): 81 mg via ORAL
  Filled 2014-06-28 (×15): qty 1

## 2014-06-28 MED ORDER — FUROSEMIDE 10 MG/ML IJ SOLN
40.0000 mg | Freq: Two times a day (BID) | INTRAMUSCULAR | Status: DC
  Administered 2014-06-28 – 2014-06-29 (×4): 40 mg via INTRAVENOUS
  Filled 2014-06-28 (×7): qty 4

## 2014-06-28 MED ORDER — SODIUM CHLORIDE 0.9 % IJ SOLN: 3.0000 mL | INTRAMUSCULAR | Status: DC | PRN

## 2014-06-28 NOTE — Progress Notes (Signed)
Tried calling patient's son, Nicole Kindred to ask to bring patient's insulin pump in. No answer. Spoke with patient and asked her if she spoke with son before I was able to get a hold of him to ask him if he could bring in patients pump from home. Patient states she will let him know.

## 2014-06-28 NOTE — Progress Notes (Addendum)
Inpatient Diabetes Program Recommendations  AACE/ADA: New Consensus Statement on Inpatient Glycemic Control (2013)  Target Ranges:  Prepandial:   less than 140 mg/dL      Peak postprandial:   less than 180 mg/dL (1-2 hours)      Critically ill patients:  140 - 180 mg/dL   Reason for Assessment:  Results for CHANTEL, TETI (MRN [id number]) as of 06/28/2014 16:28  Ref. Range 06/28/2014 00:58 06/28/2014 06:53 06/28/2014 11:21  Glucose-Capillary Latest Range: 70-99 mg/dL 167 (H) 221 (H) 217 (H)   Diabetes history:   Type 1 diabetes.  Note that patient uses insulin pump.   Outpatient Diabetes medications: According to notes from Dr. Loanne Drilling, patient's basal rates are 0.8 units/hr (total basal=19.2 units/24 hours).  She takes 11 units tid with meals and her insulin sensitivity is one unit for every 25 mg/dL greater than 120 mg/dL.  Current orders for Inpatient glycemic control: Novolog 6 units tid with meals, Novolog sensitive tid with meals.  Spoke to [employer].  She states that patient's son is supposed to bring in insulin pump this evening.  If insulin pump is not resumed this evening, consider the addition of basal insulin such as Lantus 10 units daily?  Told RN to please call MD when insulin pump arrives regarding further orders.  Thanks, Adah Perl, RN, BC-ADM Inpatient Diabetes Coordinator Pager [phone]

## 2014-06-28 NOTE — Progress Notes (Addendum)
Patient states son is suppose to bring insulin pump tonight. Dr. Wynelle Cleveland made aware that insulin pump to be here tonight. Dr. Wynelle Cleveland states she will put in order to use pump once pump gets here. Will continue to monitor and will look for pumps arrival.   Son has hooked up the insulin pump.  Patient is going to proceed with checking her sugars and dosing as appropriate.

## 2014-06-28 NOTE — Progress Notes (Signed)
  Echocardiogram 2D Echocardiogram has been performed.  Emma Yu FRANCES 06/28/2014, 10:27 AM

## 2014-06-28 NOTE — Progress Notes (Signed)
CRITICAL VALUE ALERT  Critical value received: troponin 0.66  Date of notification:  06/28/2014  Time of notification:  8377  Critical value read back: yes  Nurse who received alert:  Irfat, RN  MD notified (1st page):  Dr. Wynelle Cleveland  Time of first page:  1304  MD notified (2nd page):  Time of second page:  Responding MD:  Dr. Wynelle Cleveland  Time MD responded:  1306  Dr. Wynelle Cleveland aware of critical troponin result. No new order

## 2014-06-28 NOTE — Progress Notes (Signed)
[facility] Progress Note   Emma Yu WUJ:[id number] DOB: November 10, 1926 DOA: 06/27/2014 PCP: [name], MD  Brief narrative: Emma Yu is a 80 y.o. female with CHF (unspecified), DM2, CAD who asked her PCP about stopping her Lasix 2 wks ago and sub with dyspnea and pedal edema.    Subjective: Worried about why she has pedal edema and dyspnea. Explained the correlation of Lasix and fluid balance. States she did not like taking the Lasix but will resume it if she has to.   Assessment/Plan: Principal Problem:   CHF- unspecified - ECHO shows severe LVH, EF preserved, mild AI and MR and moderately dilated LA- no mention of diastolic dysfunction but suspect she has diastolic heart failure IVC dilated and consistent with elevated central venous pressures - cont IV Lasix  Active Problems: Elevated Troponin - mild elevation may be related to acute heart failure-  A-fib - patient not aware of having this diagnosis - rate is controlled - will discuss long term anticoagulation with her - increase Lovenox from prophylaxis dose to treatment dose    Essential hypertension, benign - improving with diuresis - cont Lopressor, Avapro- d/c Nitro paste.     Diabetes mellitus  - Insulin pump taken home by family - sugars uncontrolled - would like to resume her pump- have advised that it be brought back    Code Status: Full code Family Communication:  Disposition Plan: home DVT prophylaxis:   Consultants: none  Procedures: ECHO  Antibiotics: Anti-infectives    None         Objective: Filed Weights   06/28/14 0012  Weight: 64.4 kg (141 lb 15.6 oz)    Intake/Output Summary (Last 24 hours) at 06/28/14 1239 Last data filed at 06/28/14 0950  Gross per 24 hour  Intake    460 ml  Output   4340 ml  Net  -3880 ml     Vitals Filed Vitals:   06/28/14 0224 06/28/14 0309 06/28/14 0703 06/28/14 0940  BP: 184/55 142/51 118/46 125/60  Pulse: 78  80 96   Temp: 98.1 F (36.7 C)  97.8 F (36.6 C) 97.7 F (36.5 C)  TempSrc: Oral  Oral Oral  Resp: 18  17 17   Height:      Weight:      SpO2: 98%  99% 98%    Exam: General: AAO x 3, No acute respiratory distress Lungs: mild crackles in bases Cardiovascular: IIRR without murmur gallop or rub normal S1 and S2 Abdomen: Nontender, nondistended, soft, bowel sounds positive, no rebound, no ascites, no appreciable mass Extremities: No significant cyanosis, clubbing- +  edema bilateral lower extremities left >right   Data Reviewed: Basic Metabolic Panel:  Recent Labs Lab 06/27/14 2005  NA 141  K 4.3  CL 104  CO2 23  GLUCOSE 196*  BUN 21  CREATININE 1.03  CALCIUM 10.1   Liver Function Tests:  Recent Labs Lab 06/27/14 2005  AST 28  ALT 23  ALKPHOS 109  BILITOT 1.4*  PROT 7.9  ALBUMIN 3.6   No results for input(s): LIPASE, AMYLASE in the last 168 hours. No results for input(s): AMMONIA in the last 168 hours. CBC:  Recent Labs Lab 06/27/14 2005  WBC 5.6  NEUTROABS 4.1  HGB 10.7*  HCT 35.7*  MCV 89.0  PLT 144*   Cardiac Enzymes:  Recent Labs Lab 06/28/14 0135 06/28/14 0617  TROPONINI <0.30 <0.30   BNP (last 3 results)  Recent Labs  06/27/14 2030  PROBNP 1644.0*  CBG:  Recent Labs Lab 06/28/14 0058 06/28/14 0653 06/28/14 1121  GLUCAP 167* 221* 217*    No results found for this or any previous visit (from the past 240 hour(s)).   Studies:  Recent x-ray studies have been reviewed in detail by the Attending Physician  Scheduled Meds:  Scheduled Meds: . acidophilus   Oral Q2200  . aspirin EC  81 mg Oral BID  . cholecalciferol  2,000 Units Oral Daily  . enoxaparin (LOVENOX) injection  30 mg Subcutaneous Q24H  . furosemide  40 mg Intravenous BID  . gabapentin  300 mg Oral QHS  . insulin aspart  0-5 Units Subcutaneous QHS  . insulin aspart  0-9 Units Subcutaneous TID WC  . insulin aspart  4 Units Subcutaneous TID WC  . irbesartan  300 mg  Oral Daily  . metoprolol  50 mg Oral BID  . nitroGLYCERIN  1 inch Topical 3 times per day  . omega-3 acid ethyl esters  1 g Oral BID  . pantoprazole  40 mg Oral Daily  . pravastatin  20 mg Oral Daily  . sodium chloride  3 mL Intravenous Q12H  . sodium chloride  3 mL Intravenous Q12H   Continuous Infusions:   Time spent on care of this patient: 35 min   [place], MD 06/28/2014, 12:39 PM  LOS: 1 day   [facility] Office  [phone] Pager - Text Page per [email or web address]  If 7PM-7AM, please contact night-coverage [email or web address]

## 2014-06-28 NOTE — Evaluation (Signed)
Physical Therapy Evaluation Patient Details Name: Emma Yu MRN: [id number] DOB: [birth year] Today's Date: 06/28/2014   History of Present Illness  Patient is a 80 y/o female with PMH of CAD , CHF (EF 55-60%), mitral stenosis, Dm2, PAD, A-fib and HTN who complains of SOB for a couple of weeks. HEN:IDPO congestive heart failure. BNP elevated. Troponin downward trending.    Clinical Impression  Patient presents with functional limitations due to deficits listed in PT problem list (see below). Pt with dyspnea on exertion with abnormal elevation of HR during gait training and drop in Sa02 on RA. Pt mildly unsteady. Education provided on energy conservation techniques at home especially when dyspneic. Pt would benefit from acute PT to improve cardiovascular endurance, safe mobility and balance so pt can maximize independence and return to PLOF.     Follow Up Recommendations Home health PT;Supervision/Assistance - 24 hour   Equipment Recommendations  None recommended by PT    Recommendations for Other Services OT consult     Precautions / Restrictions Precautions Precautions: Fall Precaution Comments: Monitor 02 and HR Restrictions Weight Bearing Restrictions: No      Mobility  Bed Mobility               General bed mobility comments: Received sitting in chair upon PT arrival.   Transfers Overall transfer level: Needs assistance Equipment used: Rolling walker (2 wheeled) Transfers: Sit to/from Stand Sit to Stand: Min guard         General transfer comment: Min guard for safety.  Ambulation/Gait Ambulation/Gait assistance: Min guard Ambulation Distance (Feet): 75 Feet Assistive device: Rolling walker (2 wheeled) Gait Pattern/deviations: Step-through pattern;Decreased stride length;Trunk flexed   Gait velocity interpretation: Below normal speed for age/gender General Gait Details: Pt with mildly unsteady gait. VC's for upright posture as pt with tendency to  have downward gaze due to kyphotic posturing. ELevated HR to ~145s bpm. Standing rest break. Sa02 dropped to 88% on RA. Dyspnea present.  Stairs            Wheelchair Mobility    Modified Rankin (Stroke Patients Only)       Balance Overall balance assessment: Needs assistance Sitting-balance support: Feet supported;No upper extremity supported Sitting balance-Leahy Scale: Fair     Standing balance support: During functional activity Standing balance-Leahy Scale: Poor Standing balance comment: Requires BUE support on RW for safety/balance.                             Pertinent Vitals/Pain Pain Assessment: No/denies pain    Home Living Family/patient expects to be discharged to:: Private residence Living Arrangements: Children Available Help at Discharge: Family;Available PRN/intermittently (Son works 7:00 am-1:30 pm) Type of Home: House Home Access: Stairs to enter Entrance Stairs-Rails: None Technical brewer of Steps: 1+1 Home Layout: Laundry or work area in basement (1 flight to get to basement where pt does laundry.,) Home Equipment: [place] - 4 wheels;Shower seat;Cane - single point      Prior Function Level of Independence: Needs assistance   Gait / Transfers Assistance Needed: USes rollator for ambulation.  ADL's / Homemaking Assistance Needed: (I) for ADLS.   Comments: Pt reports son does IADLS - cooking/cleaning.      Hand Dominance   Dominant Hand: Right    Extremity/Trunk Assessment   Upper Extremity Assessment: Overall WFL for tasks assessed           Lower Extremity Assessment: Generalized weakness  Communication   Communication: HOH  Cognition Arousal/Alertness: Awake/alert Behavior During Therapy: WFL for tasks assessed/performed Overall Cognitive Status: Within Functional Limits for tasks assessed                      General Comments General comments (skin integrity, edema, etc.): Education on  importance of elevating BLEs to assist with swelling management. Edema and tightness LLE distal to knee in calf area. Bandage on L shin due to blister per pt report.    Exercises        Assessment/Plan    PT Assessment Patient needs continued PT services  PT Diagnosis Generalized weakness;Difficulty walking   PT Problem List Decreased strength;Cardiopulmonary status limiting activity;Decreased activity tolerance;Decreased balance;Decreased safety awareness;Decreased mobility  PT Treatment Interventions Balance training;Gait training;Stair training;Patient/family education;Functional mobility training;Therapeutic activities;Therapeutic exercise   PT Goals (Current goals can be found in the Care Plan section) Acute Rehab PT Goals Patient Stated Goal: to get home as soon as possible and get this swelling better PT Goal Formulation: With patient Time For Goal Achievement: 07/12/14 Potential to Achieve Goals: Good    Frequency Min 3X/week   Barriers to discharge Decreased caregiver support Pt home alone during the early part of the day.    Co-evaluation               End of Session Equipment Utilized During Treatment: Gait belt Activity Tolerance: Other (comment) (ELevated HR and drop in Sa02 on RA.) Patient left: in chair;with call bell/phone within reach Nurse Communication: Mobility status         Time: 9326-7124 PT Time Calculation (min): 16 min   Charges:   PT Evaluation $Initial PT Evaluation Tier I: 1 Procedure PT Treatments $Gait Training: 8-22 mins   PT G CodesCandy Sledge A 06/28/2014, 5:05 PM  Candy Sledge, [place], DPT [phone]

## 2014-06-28 NOTE — Progress Notes (Addendum)
Patient arrived to [place] at [facility].  Patient had received Lasix and nitro paste in the ED.  Patient was on 4LNC and sats were WNL.  Patient had foley catheter in place for diuresis.  Patient had no complaints besides her legs cramping.  Administered snack and schedule medications.  Patient had blood pressure 200 systolic.  Gave metopolol and nitro paste which was scheduled.  In one hour I will reassess blood pressure. Will continue to monitor.

## 2014-06-28 NOTE — Care Management Note (Addendum)
  Page 2 of 2   07/01/2014     3:49:00 PM CARE MANAGEMENT NOTE 07/01/2014  Patient:  Emma Yu, Emma Yu   Account Number:  [id number]  Date Initiated:  06/28/2014  Documentation initiated by:  Mckinnley Smithey  Subjective/Objective Assessment:   CHF, SOB     Action/Plan:   CM to follow for disposition needs   Anticipated DC Date:  06/30/2014   Anticipated DC Plan:  [place]  CM consult      [facility] Choice  HOME HEALTH   Choice offered to / List presented to:  C-1 Patient        [place] arranged  HH-1 RN  [place].   Status of service:  Completed, signed off Medicare Important Message given?  YES (If response is "NO", the following Medicare IM given date fields will be blank) Date Medicare IM given:  06/30/2014 Medicare IM given by:  Marthe Dant Date Additional Medicare IM given:   Additional Medicare IM given by:    Discharge Disposition:  [place]  Per UR Regulation:  Reviewed for med. necessity/level of care/duration of stay  If discussed at Long Length of Stay Meetings, dates discussed:    Comments:  Kerri-Anne Haeberle RN, BSN, MSHL, CCM  Nurse - Case Manager,  ([facility] [place])  [phone]  07/01/2014 PT RECS:  HH PT Past services with [facility] Dispo Plan:  Home with HHS: RN/CHF/med mgmt; PT  ([facility] notified) CM will continue ot monitor for d/c needs as indicated.

## 2014-06-28 NOTE — Progress Notes (Signed)
UR completed Tachina Spoonemore K. Dahir Ayer, RN, BSN, [place], CCM  06/28/2014 2:08 PM

## 2014-06-29 DIAGNOSIS — I4891 Unspecified atrial fibrillation: Secondary | ICD-10-CM

## 2014-06-29 DIAGNOSIS — I482 Chronic atrial fibrillation, unspecified: Secondary | ICD-10-CM | POA: Diagnosis present

## 2014-06-29 DIAGNOSIS — R7989 Other specified abnormal findings of blood chemistry: Secondary | ICD-10-CM | POA: Diagnosis present

## 2014-06-29 DIAGNOSIS — I5033 Acute on chronic diastolic (congestive) heart failure: Secondary | ICD-10-CM | POA: Diagnosis present

## 2014-06-29 DIAGNOSIS — R778 Other specified abnormalities of plasma proteins: Secondary | ICD-10-CM | POA: Diagnosis present

## 2014-06-29 LAB — BASIC METABOLIC PANEL
Anion gap: 16 — ABNORMAL HIGH (ref 5–15)
BUN: 31 mg/dL — AB (ref 6–23)
CALCIUM: 10.4 mg/dL (ref 8.4–10.5)
CHLORIDE: 99 meq/L (ref 96–112)
CO2: 27 meq/L (ref 19–32)
Creatinine, Ser: 1.13 mg/dL — ABNORMAL HIGH (ref 0.50–1.10)
GFR calc Af Amer: 50 mL/min — ABNORMAL LOW (ref >90)
GFR calc non Af Amer: 43 mL/min — ABNORMAL LOW (ref >90)
GLUCOSE: 146 mg/dL — AB (ref 70–99)
Potassium: 3.8 mEq/L (ref 3.7–5.3)
Sodium: 142 mEq/L (ref 137–147)

## 2014-06-29 LAB — TROPONIN I: Troponin I: 0.42 ng/mL (ref <0.30)

## 2014-06-29 LAB — GLUCOSE, CAPILLARY
GLUCOSE-CAPILLARY: 268 mg/dL — AB (ref 70–99)
Glucose-Capillary: 209 mg/dL — ABNORMAL HIGH (ref 70–99)
Glucose-Capillary: 293 mg/dL — ABNORMAL HIGH (ref 70–99)
Glucose-Capillary: 304 mg/dL — ABNORMAL HIGH (ref 70–99)

## 2014-06-29 MED ORDER — DILTIAZEM HCL 60 MG PO TABS
60.0000 mg | ORAL_TABLET | Freq: Four times a day (QID) | ORAL | Status: DC
  Administered 2014-06-29 – 2014-07-02 (×12): 60 mg via ORAL
  Filled 2014-06-29 (×16): qty 1

## 2014-06-29 MED ORDER — METOPROLOL TARTRATE 1 MG/ML IV SOLN: 5.0000 mg | INTRAVENOUS | Status: DC | PRN

## 2014-06-29 MED ORDER — INSULIN PUMP
Freq: Three times a day (TID) | SUBCUTANEOUS | Status: DC
  Administered 2014-06-29 (×3): via SUBCUTANEOUS
  Administered 2014-06-30: 13 via SUBCUTANEOUS
  Administered 2014-06-30: 31 via SUBCUTANEOUS
  Administered 2014-07-01 (×3): 1 via SUBCUTANEOUS
  Administered 2014-07-01 – 2014-07-02 (×2): via SUBCUTANEOUS
  Administered 2014-07-02: 10 via SUBCUTANEOUS
  Administered 2014-07-02 (×2): 1 via SUBCUTANEOUS
  Administered 2014-07-02: 06:00:00 via SUBCUTANEOUS
  Administered 2014-07-03: 11 via SUBCUTANEOUS
  Administered 2014-07-03: 02:00:00 via SUBCUTANEOUS
  Administered 2014-07-03 (×2): 11 via SUBCUTANEOUS
  Administered 2014-07-03: 23:00:00 via SUBCUTANEOUS
  Administered 2014-07-04: 10 via SUBCUTANEOUS
  Filled 2014-06-29: qty 1

## 2014-06-29 MED ORDER — INSULIN ASPART 100 UNIT/ML ~~LOC~~ SOLN: 0.0000 [IU] | Freq: Three times a day (TID) | SUBCUTANEOUS | Status: DC

## 2014-06-29 MED ORDER — INSULIN ASPART 100 UNIT/ML ~~LOC~~ SOLN
0.0000 [IU] | Freq: Every day | SUBCUTANEOUS | Status: DC
  Administered 2014-06-29: 4 [IU] via SUBCUTANEOUS
  Administered 2014-06-30 – 2014-07-01 (×2): 2 [IU] via SUBCUTANEOUS

## 2014-06-29 MED ORDER — ONDANSETRON HCL 4 MG/2ML IJ SOLN
4.0000 mg | Freq: Four times a day (QID) | INTRAMUSCULAR | Status: DC
  Administered 2014-06-29 – 2014-07-04 (×14): 4 mg via INTRAVENOUS
  Filled 2014-06-29 (×14): qty 2

## 2014-06-29 MED ORDER — ONDANSETRON HCL 4 MG/2ML IJ SOLN
4.0000 mg | Freq: Four times a day (QID) | INTRAMUSCULAR | Status: DC | PRN
Start: 2014-06-29 — End: 2014-06-29
  Administered 2014-06-29: 4 mg via INTRAVENOUS
  Filled 2014-06-29: qty 2

## 2014-06-29 MED ORDER — INSULIN ASPART 100 UNIT/ML ~~LOC~~ SOLN: 0.0000 [IU] | Freq: Every day | SUBCUTANEOUS | Status: DC

## 2014-06-29 MED ORDER — METOPROLOL TARTRATE 50 MG PO TABS
75.0000 mg | ORAL_TABLET | Freq: Two times a day (BID) | ORAL | Status: DC
  Administered 2014-06-29 – 2014-06-30 (×3): 75 mg via ORAL
  Filled 2014-06-29 (×4): qty 1

## 2014-06-29 NOTE — Consult Note (Addendum)
CARDIOLOGY CONSULT NOTE   Patient ID: Emma Yu MRN: [id number] DOB/AGE: 18-Nov-1926 80 y.o.  Admit Date: 06/27/2014  Primary Physician: [name], MD  Primary Cardiologist    Nishan   Clinical Summary Ms. Vanderploeg is a 80 y.o.female.  By history she has chronic diastolic CHF and atrial fibrillation. [name] not anticoagulated due to significant fall risk. She has been unhappy having to go to the bathroom after using her diuretics. So she decided to stop her diuretics. She developed congestive heart failure and was admitted for treatment. [name] diuresing. Her atrial fib rate continues to be high especially with movement.  The patient is followed by Dr. Johnsie Cancel for her cardiology problems. She was stable when she saw him last in the office August, 2015. There is a history of very mild coronary artery disease. She has diabetes. She has significant left ventricular hypertrophy. There is also moderate aortic stenosis. She has peripheral arterial disease that has been treated and is followed. There is also venous insufficiency. Echocardiogram this admission shows left ventricular hypertrophy. Ejection fraction is 65%. There is felt to be mild to moderate aortic stenosis. Also there is significant mitral annular calcification with mild functional mitral stenosis.   Allergies  Allergen Reactions  . Carvedilol Other (See Comments)    Heart stops  . Clindamycin/Lincomycin Other (See Comments)    tremors  . Diltiazem Hcl Other (See Comments)    Chest pain  . Fenofibrate Other (See Comments)    Unknown - [facility] - MAR  . Hctz [Hydrochlorothiazide] Other (See Comments)    Chest pain  . Nisoldipine Other (See Comments)    Chest pain  . Nitroglycerin Other (See Comments)    Unknown - [facility] MAR  . Propranolol Diarrhea    Chest pain  . Septra [Sulfamethoxazole-Trimethoprim]     Rash, cyst  . Patrici Ranks Fumarate] Other (See Comments)    Unknown reaction  . Valturna  [Aliskiren-Valsartan] Other (See Comments)    Unknown reaction    Medications Scheduled Medications: . acidophilus   Oral Q2200  . aspirin EC  81 mg Oral BID  . cholecalciferol  2,000 Units Oral Daily  . enoxaparin (LOVENOX) injection  65 mg Subcutaneous Q24H  . furosemide  40 mg Intravenous BID  . gabapentin  600 mg Oral QHS  . insulin aspart  4 Units Subcutaneous TID WC  . insulin pump   Subcutaneous TID AC, HS, 0200  . irbesartan  300 mg Oral Daily  . metoprolol  75 mg Oral BID  . omega-3 acid ethyl esters  1 g Oral BID  . pantoprazole  40 mg Oral Daily  . pravastatin  20 mg Oral Daily  . sodium chloride  3 mL Intravenous Q12H  . sodium chloride  3 mL Intravenous Q12H     Infusions:     PRN Medications:  sodium chloride, acetaminophen **OR** acetaminophen, metoprolol, sodium chloride, traMADol   Past Medical History  Diagnosis Date  . PAD (peripheral artery disease)     s/p bilateral comon iliac artery stenting in 2002. Known significant  R SFA  disease. carotid dz noted 11/2011 followed by Dr. Bridgett Larsson  . Diabetes mellitus   . Hypertension   . Hyperlipidemia   . Venous insufficiency   . Chronic diastolic heart failure     Echocardiogram 12/10/11: Moderate LVH, EF 65-70%, moderate aortic stenosis, mean gradient 20, mild MS  . Atrial fibrillation     not a coumadin candidate secondary to  fall risk  . DJD (degenerative joint disease) of hip     s/p R THR 10/2010  . Aortic stenosis     Moderate by echo 4/13 - mean 20 mmHg  . Mitral stenosis     Mild by echo 4/13  . Anemia     iron deficient  . Acute respiratory failure     12/2011 admission - decreased O2 sats on ambulation, improved by time of discharge  . CAD (coronary artery disease)     minimal plaque by cath 5/12  . CHF (congestive heart failure)     EF 55-60%    Past Surgical History  Procedure Laterality Date  . Total hip arthroplasty  1991, 1994    redo in 1994  . Lumbar disc surgery  1999  . Cardiac  catheterization  01/10/2011    No significant CAD  . Cholecystectomy    . Abdominal hysterectomy    . Cataract extraction  2015    Family History  Problem Relation Age of Onset  . Diabetes Father   . Diabetes Other     5/8 sibs    Social History Ms. Borromeo reports that she quit smoking about 28 years ago. Her smoking use included Cigarettes. She has a 50 pack-year smoking history. She has never used smokeless tobacco. Ms. Darius reports that she does not drink alcohol.  Review of Systems  The patient denies fever, chills, headache, sweats, rash, change in vision, nausea vomiting, urinary symptoms. All other systems are reviewed and are negative.  Physical Examination Blood pressure 143/76, pulse 127, temperature 98 F (36.7 C), temperature source Oral, resp. rate 18, height 5\' 4"  (1.626 m), weight 136 lb 14.5 oz (62.1 kg), SpO2 93 %.  Intake/Output Summary (Last 24 hours) at 06/29/14 1028 Last data filed at 06/29/14 1005  Gross per 24 hour  Intake    560 ml  Output   2050 ml  Net  -1490 ml   The patient is comfortable in bed. She says that she's feeling better. [name] oriented to person time and place. Affect is normal. Head is atraumatic. Sclera and conjunctiva are normal. There is mild jugular venous distention. Lungs reveal diffuse rales. There is no respiratory distress. Cardiac exam reveals an S1 with an S2. There is a systolic murmur. Abdomen is soft. She has some ecchymoses on her lower extremities. There is a bandage on the left lower extremity. She has kyphosis of the spine. Neurologically she is grossly intact.  Prior Cardiac Testing/Procedures  Lab Results  Basic Metabolic Panel:  Recent Labs Lab 06/27/14 2005 06/29/14 0110  NA 141 142  K 4.3 3.8  CL 104 99  CO2 23 27  GLUCOSE 196* 146*  BUN 21 31*  CREATININE 1.03 1.13*  CALCIUM 10.1 10.4    Liver Function Tests:  Recent Labs Lab 06/27/14 2005  AST 28  ALT 23  ALKPHOS 109  BILITOT 1.4*    PROT 7.9  ALBUMIN 3.6    CBC:  Recent Labs Lab 06/27/14 2005  WBC 5.6  NEUTROABS 4.1  HGB 10.7*  HCT 35.7*  MCV 89.0  PLT 144*    Cardiac Enzymes:  Recent Labs Lab 06/28/14 0617 06/28/14 1155 06/28/14 1317 06/28/14 1940 06/29/14 0110  TROPONINI <0.30 0.66* 0.52* 0.33* 0.42*    BNP: Invalid input(s): POCBNP   Radiology: Dg Chest 1 View  06/27/2014   CLINICAL DATA:  SOB, Hx of HTN, diabetes. Went to urgent care in [place] for a large blister on  left lower leg, doctor told her if she got SOB to go to ER  EXAM: CHEST - 1 VIEW  COMPARISON:  04/19/2014  FINDINGS: Cardiac silhouette is mildly enlarged. No mediastinal or hilar masses. There is vascular congestion and mild interstitial thickening most evident in the lung bases. Mild hazy opacity is also noted in both lung bases, likely atelectasis. No evidence of pneumonia.  Bony thorax is demineralized but grossly intact.  IMPRESSION: 1. Mild congestive heart failure.   Electronically Signed   By: Lajean Manes M.D.   On: 06/27/2014 21:02   EKG reveals coarse atrial fibrillation. There is interventricular conduction delay. There is decrease anterior R-wave progression. There is no change when compared to the prior tracings.  Telemetry:   I have reviewed telemetry today June 29, 2014. There is atrial fibrillation. The rate is increased. There is further increase in her heart rate when she moves.   Impression and Recommendations     Essential hypertension, benign   Diabetes mellitus type 1 with peripheral artery disease    Mitral stenosis     The patient has mild functional mitral stenosis. It is very important to control her heart rate in this setting. Her mitral stenosis will lead to increased left atrial filling pressures in particular when her heart rate is fast.    Aortic stenosis     She has moderate aortic stenosis. This plays a role with her retaining fluid. There is no indication at this time for a  procedure to treat her aortic valve.    CAD (coronary artery disease)     Historically coronary disease is mild. Her troponin is mildly elevated this admission. This is secondary to CHF.    Elevated troponin     There is mild troponin elevation this admission. This is secondary to CHF. No further workup was needed.    Chronic atrial fibrillation     There is a history of chronic atrial fibrillation. [name] not anticoagulated because of the increased fall risk. There will be no plan to start her on an anticoagulant at this time for outpatient therapy.    Acute on chronic diastolic CHF (congestive heart failure)     It is very clear that the patient needs diuretics at home. She had been on a diuretic but decided she wanted to stop it because she did not like going to the bathroom. I had a long discussion with her about this. She does not watch her salt intake. She does not seem to drink a lot of extra fluid. I encouraged her to be careful with both her salt and fluid intake. I made it clear that a diuretic will be the only way to keep her out of the hospital. In the discussion she tried to negotiate with me to be sure that she only had to take "1 diuretic pill."  I suspect we can get away with this if we use a large enough single dose in the morning. [name] still wet. I would continue IV diuresis watching her renal function. Also watching her potassium. She has normal left jugular systolic function. Controlling her heart rate will be important also in the treatment of her CHF.    Atrial fibrillation with rapid ventricular response    The patient has chronic atrial fibrillation. In the hospital now with CHF she has a significant increase in her heart rate. Heart rate issues are both a primary and secondary problem in this situation. [name] a good candidate for  treatment with diltiazem. I'm starting at 60 mg 4 times daily. If she tolerates this and can be converted to a once a day long-acting  diltiazem.    Hypertension     Controlling her blood pressure also is very important in this setting. Diltiazem is to be started. We can then follow her pressure and see what other adjustments need to be made. There is question of a history of an allergy to diltiazem. There is question that this was related to chest pain. I feel that this drug is indicated and that it is safe to try it.   Daryel November, MD 06/29/2014, 10:28 AM

## 2014-06-29 NOTE — Progress Notes (Signed)
Patient heart rate accellerates while moving/walking to 130s-150s.    Gave PRN hydralazine this AM for high blood pressure over 099 systolic.  Brought it down to 833A systolic. Will continue to monitor.

## 2014-06-29 NOTE — Progress Notes (Addendum)
[facility] Progress Note   Emma Yu TMA:[id number] DOB: 1927-04-01 DOA: 06/27/2014 PCP: Emma Calico, MD  Brief narrative: Emma Yu is a 80 y.o. female with CHF (unspecified), DM2, CAD who asked her PCP about stopping her Lasix 2 wks ago and subsequently presented to the ER with dyspnea and pedal edema. EKG in the ER shows atrial fibrillation. Patient is unsure if she has a history of this in the past. [name] not on anticoagulation for it.   Subjective: had some mild nausea today-  per RN she vomited a very small amount but afterwards was able to drink and keep fluids down.  Assessment/Plan: Principal Problem:   CHF- unspecified - ECHO shows severe LVH, EF preserved, mild AI and MR and moderately dilated LA- no mention of diastolic dysfunction but suspect she has diastolic heart failure-  IVC dilated and consistent with elevated central venous pressures - cont IV Lasix  Active Problems: Elevated Troponin - mild elevation may be related to acute heart failure   A-fib - patient not aware of having this diagnosis - rate was controlled yesterday but becoming more rapid today-I have increased Lopressor from 50 to 75 twice a day and have added IV Lopressor PRN - I have discussed long term anticoagulation with her - increased Lovenox from prophylaxis dose to treatment dose - consulted cardiology for further management    Essential hypertension, benign - improving with diuresis - cont Lopressor, Avapro- d/c'd Nitro paste.     Diabetes mellitus  - Insulin pump taken home by family- have asked him to bring it back and patient is now managing it - following sugars closely and if patient is not managing it appropriately, it will be addressed prior to d/c-   Code Status: Full code Family Communication: no family at bedside Disposition Plan: home DVT prophylaxis:   Consultants: none  Procedures: ECHO  Antibiotics: Anti-infectives    None       Objective: Filed Weights   06/28/14 0012 06/29/14 0517  Weight: 64.4 kg (141 lb 15.6 oz) 62.1 kg (136 lb 14.5 oz)    Intake/Output Summary (Last 24 hours) at 06/29/14 1054 Last data filed at 06/29/14 1005  Gross per 24 hour  Intake    560 ml  Output   2050 ml  Net  -1490 ml     Vitals Filed Vitals:   06/29/14 0843 06/29/14 0846 06/29/14 1003 06/29/14 1016  BP: 143/76     Pulse: 127  100 85  Temp:      TempSrc:      Resp: 18     Height:      Weight:      SpO2: 88% 93%      Exam: General: AAO x 3, No acute respiratory distress Lungs: mild crackles in bases Cardiovascular: IIRR without murmur gallop or rub normal S1 and S2 Abdomen: Nontender, nondistended, soft, bowel sounds positive, no rebound, no ascites, no appreciable mass Extremities: No significant cyanosis, clubbing-  Mild edema bilateral lower extremities - significantly improved from yesterday  Data Reviewed: Basic Metabolic Panel:  Recent Labs Lab 06/27/14 2005 06/29/14 0110  NA 141 142  K 4.3 3.8  CL 104 99  CO2 23 27  GLUCOSE 196* 146*  BUN 21 31*  CREATININE 1.03 1.13*  CALCIUM 10.1 10.4   Liver Function Tests:  Recent Labs Lab 06/27/14 2005  AST 28  ALT 23  ALKPHOS 109  BILITOT 1.4*  PROT 7.9  ALBUMIN 3.6   No results  for input(s): LIPASE, AMYLASE in the last 168 hours. No results for input(s): AMMONIA in the last 168 hours. CBC:  Recent Labs Lab 06/27/14 2005  WBC 5.6  NEUTROABS 4.1  HGB 10.7*  HCT 35.7*  MCV 89.0  PLT 144*   Cardiac Enzymes:  Recent Labs Lab 06/28/14 0617 06/28/14 1155 06/28/14 1317 06/28/14 1940 06/29/14 0110  TROPONINI <0.30 0.66* 0.52* 0.33* 0.42*   BNP (last 3 results)  Recent Labs  06/27/14 2030  PROBNP 1644.0*   CBG:  Recent Labs Lab 06/28/14 0653 06/28/14 1121 06/28/14 1634 06/28/14 2124 06/29/14 0557  GLUCAP 221* 217* 167* 191* 209*    No results found for this or any previous visit (from the past 240 hour(s)).    Studies:  Recent x-ray studies have been reviewed in detail by the Attending Physician  Scheduled Meds:  Scheduled Meds: . acidophilus   Oral Q2200  . aspirin EC  81 mg Oral BID  . cholecalciferol  2,000 Units Oral Daily  . enoxaparin (LOVENOX) injection  65 mg Subcutaneous Q24H  . furosemide  40 mg Intravenous BID  . gabapentin  600 mg Oral QHS  . insulin pump   Subcutaneous TID AC, HS, 0200  . irbesartan  300 mg Oral Daily  . metoprolol  75 mg Oral BID  . omega-3 acid ethyl esters  1 g Oral BID  . pantoprazole  40 mg Oral Daily  . pravastatin  20 mg Oral Daily  . sodium chloride  3 mL Intravenous Q12H  . sodium chloride  3 mL Intravenous Q12H   Continuous Infusions:   Time spent on care of this patient: 35 min   [place], MD 06/29/2014, 10:54 AM  LOS: 2 days   [facility] Office  [phone] Pager - Text Page per [email or web address]  If 7PM-7AM, please contact night-coverage [email or web address]

## 2014-06-29 NOTE — Progress Notes (Signed)
Physical Therapy Treatment Patient Details Name: JAIAH WEIGEL MRN: [id number] DOB: June 21, 1927 Today's Date: 06/29/2014    History of Present Illness Patient is a 80 y/o female with PMH of CAD , CHF (EF 55-60%), mitral stenosis, Dm2, PAD, A-fib and HTN who complains of SOB for a couple of weeks. ATF:TDDU congestive heart failure. BNP elevated. Troponin downward trending.    PT Comments    Pt fatigues quickly with mobility. Pt also currently requiring O2. Pt wants to return home but son is at work during the day. Says a neighbor checks on her but I am concerned that if her endurance doesn't improve before dc that she will struggle at home. If no improvement in next 24 hours may need ST-SNF.  Follow Up Recommendations  Home health PT;Supervision/Assistance - 24 hour     Equipment Recommendations  None recommended by PT    Recommendations for Other Services       Precautions / Restrictions Precautions Precautions: Fall Restrictions Weight Bearing Restrictions: No    Mobility  Bed Mobility Overal bed mobility: Needs Assistance Bed Mobility: Supine to Sit     Supine to sit: Min assist     General bed mobility comments: Assist to bring trunk up  Transfers Overall transfer level: Needs assistance Equipment used: 4-wheeled walker Transfers: Sit to/from Stand Sit to Stand: Min guard         General transfer comment: Min guard for balance and safety.  Ambulation/Gait Ambulation/Gait assistance: Min guard Ambulation Distance (Feet): 50 Feet (x 2) Assistive device: 4-wheeled walker Gait Pattern/deviations: Step-through pattern;Decreased step length - right;Decreased step length - left;Trunk flexed   Gait velocity interpretation: Below normal speed for age/gender General Gait Details: Verbal cues to stand more erect. Pt fatigues very quickly and required seated rest break. Unsteady gait.   Stairs            Wheelchair Mobility    Modified Rankin (Stroke  Patients Only)       Balance Overall balance assessment: Needs assistance Sitting-balance support: No upper extremity supported;Feet supported Sitting balance-Leahy Scale: Good     Standing balance support: Bilateral upper extremity supported Standing balance-Leahy Scale: Poor Standing balance comment: support of rollator                    Cognition Arousal/Alertness: Awake/alert Behavior During Therapy: WFL for tasks assessed/performed Overall Cognitive Status: Within Functional Limits for tasks assessed                      Exercises      General Comments        Pertinent Vitals/Pain Pain Assessment: No/denies pain    Home Living                      Prior Function            PT Goals (current goals can now be found in the care plan section) Progress towards PT goals: Not progressing toward goals - comment (fatigue)    Frequency  Min 3X/week    PT Plan Current plan remains appropriate    Co-evaluation             End of Session Equipment Utilized During Treatment: Gait belt;Oxygen Activity Tolerance: Patient limited by fatigue Patient left: in chair;with call bell/phone within reach     Time: 0947-1012 PT Time Calculation (min) (ACUTE ONLY): 25 min  Charges:  $Gait Training: 23-37 mins  G Codes:      Akira Adelsberger 06/29/2014, 10:35 AM  [facility] PT [phone]

## 2014-06-29 NOTE — Progress Notes (Signed)
Patient heart rate dropped to 33 via cardiac monitor at 1251. Patient sitting in chair with minimal emesis in basin. Patient alert and oriented, however states she is unable to keep food down. No other complaints at this time. Patient HR at this time = 107. BP slightly high 182/64. Dr. Wynelle Cleveland made aware of patient's HR dropping to 33. MD to place new orders regarding patient condition. Will continue to monitor.

## 2014-06-29 NOTE — Progress Notes (Signed)
Inpatient Diabetes Program Recommendations  AACE/ADA: New Consensus Statement on Inpatient Glycemic Control (2013)  Target Ranges:  Prepandial:   less than 140 mg/dL      Peak postprandial:   less than 180 mg/dL (1-2 hours)      Critically ill patients:  140 - 180 mg/dL     Talked with patient regarding her bolus doses per her insulin pump for bolus doses for correction and po intake. Pump setup is programmed for basal and bolus, however pt states she gives 11 units bolus before each meal regardless of glucose and po intake. CBG's have risen into upper 200's. [name] extremely sick to her stomach and when I talk to her, she drifts off to sleep and just shakes her head. Called Dr Wynelle Cleveland with concern for patient ability at this time to enter her glucose and use the pump setup for bolus as she is not doing at this time. Requested a change from the pump to basal and bolus. MD prefers to leave pt on her pump at this time. If glucose continues to rise, may want to consider keeping the patient on her insulin pump for basal needs only and use a sensitive correction scale before meals along with the 6 units meal coverage as was previously ordered. If pump needs to be discontinued, her total basal per her pump is 19.2 units per 24 hrs-could use the lantus at 15 units daily to start.  Thank you, Rosita Kea, RN, CNS, Diabetes Coordinator [phone])

## 2014-06-29 NOTE — Progress Notes (Signed)
Inpatient Diabetes Program Recommendations  AACE/ADA: New Consensus Statement on Inpatient Glycemic Control (2013)  Target Ranges:  Prepandial:   less than 140 mg/dL      Peak postprandial:   less than 180 mg/dL (1-2 hours)      Critically ill patients:  140 - 180 mg/dL   Reason for assessment: insulin pump restart from home  Diabetes history: Type 2 Outpatient Diabetes medications: Animas insulin pump- total daily dose- 70 units Current orders for Inpatient glycemic control: Using insulin pump brought in from home  Reviewed notes.  Patient has Animas insulin pump on. Contract signed.  Bolusing as needed based on CBG.   Gentry Fitz, RN, BA, MHA, CDE Diabetes Coordinator Inpatient Diabetes Program  [phone] (Team Pager) [phone] [facility]) 06/29/2014 10:32 AM

## 2014-06-29 NOTE — Progress Notes (Signed)
Spoke with Dr. Wynelle Cleveland regarding patients nausea and HR dropping when patient has small amount of emesis. Patient does currently does not have any complaints at this time. Dr. Wynelle Cleveland also aware that patient;s CBG increasing. MD to make adjustments with medications. Will continue to monitor patient.

## 2014-06-30 DIAGNOSIS — I350 Nonrheumatic aortic (valve) stenosis: Secondary | ICD-10-CM

## 2014-06-30 DIAGNOSIS — I482 Chronic atrial fibrillation: Secondary | ICD-10-CM

## 2014-06-30 LAB — BASIC METABOLIC PANEL
ANION GAP: 17 — AB (ref 5–15)
BUN: 40 mg/dL — ABNORMAL HIGH (ref 6–23)
CO2: 27 mEq/L (ref 19–32)
Calcium: 10.5 mg/dL (ref 8.4–10.5)
Chloride: 98 mEq/L (ref 96–112)
Creatinine, Ser: 1.23 mg/dL — ABNORMAL HIGH (ref 0.50–1.10)
GFR, EST AFRICAN AMERICAN: 45 mL/min — AB (ref >90)
GFR, EST NON AFRICAN AMERICAN: 39 mL/min — AB (ref >90)
GLUCOSE: 186 mg/dL — AB (ref 70–99)
Potassium: 4.1 mEq/L (ref 3.7–5.3)
SODIUM: 142 meq/L (ref 137–147)

## 2014-06-30 LAB — GLUCOSE, CAPILLARY
GLUCOSE-CAPILLARY: 248 mg/dL — AB (ref 70–99)
Glucose-Capillary: 222 mg/dL — ABNORMAL HIGH (ref 70–99)
Glucose-Capillary: 193 mg/dL — ABNORMAL HIGH (ref 70–99)
Glucose-Capillary: 228 mg/dL — ABNORMAL HIGH (ref 70–99)
Glucose-Capillary: 238 mg/dL — ABNORMAL HIGH (ref 70–99)

## 2014-06-30 MED ORDER — FUROSEMIDE 10 MG/ML IJ SOLN
20.0000 mg | Freq: Two times a day (BID) | INTRAMUSCULAR | Status: DC
  Administered 2014-06-30 – 2014-07-01 (×3): 20 mg via INTRAVENOUS
  Filled 2014-06-30 (×2): qty 2

## 2014-06-30 MED ORDER — METOPROLOL TARTRATE 100 MG PO TABS
100.0000 mg | ORAL_TABLET | Freq: Two times a day (BID) | ORAL | Status: DC
  Administered 2014-06-30 – 2014-07-04 (×7): 100 mg via ORAL
  Filled 2014-06-30 (×9): qty 1

## 2014-06-30 MED ORDER — IRBESARTAN 300 MG PO TABS
300.0000 mg | ORAL_TABLET | Freq: Every day | ORAL | Status: DC
  Administered 2014-06-30 – 2014-07-04 (×5): 300 mg via ORAL
  Filled 2014-06-30 (×5): qty 1

## 2014-06-30 NOTE — Progress Notes (Signed)
Inpatient Diabetes Program Recommendations  AACE/ADA: New Consensus Statement on Inpatient Glycemic Control (2013)  Target Ranges:  Prepandial:   less than 140 mg/dL      Peak postprandial:   less than 180 mg/dL (1-2 hours)      Critically ill patients:  140 - 180 mg/dL   Reason for assessment: insulin pump use as an inpatient  Diabetes history: Type 2 Outpatient Diabetes medications: Animas insulin pump- total daily dose- 70 units Current orders for Inpatient glycemic control: Using insulin pump brought in from home  Reviewed notes. Patient has Animas insulin pump on. Contract signed. Spoke with RN this am who states the patient is alert and oriented although she does state she has concerns with patient managing the pump.  Note patien'ts A1C is 6.7% on 06/28/14.  CBG checked this am and patient gave herself 13 units of bolus insulin.  If MD decides to d/c insulin pump use while patient is in the hospital, please see notes from Diabetes coordinator, Rosita Kea dated 06/29/14 for recommendations.   Gentry Fitz, RN, BA, MHA, CDE Diabetes Coordinator Inpatient Diabetes Program  [phone] (Team Pager) [phone] [facility]) 06/30/2014 8:28 AM

## 2014-06-30 NOTE — Progress Notes (Signed)
[facility] PROGRESS NOTE  Emma Yu RSW:[id number] DOB: Sep 10, 1926 DOA: 06/27/2014 PCP: [name], MD    Assessment/Plan: 1. Likely acutely decompensated diastolic CHF 1. Cardiology following 2. 64kg on admit, currently 61kg 3. Net neg 1.5L 4. On BID IV lasix 2. Afib 1. Rate controlled 2. On cardizem and beta blocker 3. HTN 1. BP stable, albeit suboptimal 2. Will increase metoprolol to 100mg  bid 3. Cont cardizem 4. DM 1. Pt is on insulin gtt 2. Glucose has remained relatively stable 3. Cont for now 5. DVT prophylaixs 1. SubQ lovenox  Code Status: Full Family Communication: Pt in room (indicate person spoken with, relationship, and if by phone, the number) Disposition Plan: Pending  I/O last 3 completed shifts: In: 360 [P.O.:360] Out: 2675 [Urine:2675] Total I/O In: 240 [P.O.:240] Out: 275 [Urine:275]  Weight change: -0.7 kg (-1 lb 8.7 oz)  Consultants:  Cardiology  Procedures:    Antibiotics:   (indicate start date, and stop date if known)  HPI/Subjective: No complaints. Eager to go home  Objective: Filed Vitals:   06/29/14 1919 06/29/14 1952 06/29/14 2046 06/30/14 0613  BP: 188/62 163/80  158/42  Pulse: 134 123 92 99  Temp:  98.2 F (36.8 C)  99 F (37.2 C)  TempSrc:    Oral  Resp:    18  Height:      Weight:    61.4 kg (135 lb 5.8 oz)  SpO2: 92% 97%      Intake/Output Summary (Last 24 hours) at 06/30/14 1449 Last data filed at 06/30/14 1410  Gross per 24 hour  Intake    480 ml  Output    925 ml  Net   -445 ml   Filed Weights   06/28/14 0012 06/29/14 0517 06/30/14 0613  Weight: 64.4 kg (141 lb 15.6 oz) 62.1 kg (136 lb 14.5 oz) 61.4 kg (135 lb 5.8 oz)    Exam:   General:  Awake, in nad  Cardiovascular: regular, s1, s2  Respiratory: normal resp effort, no wheezing  Abdomen: soft,nondistended  Musculoskeletal: perfused, no clubbing   Data Reviewed: Basic Metabolic Panel:  Recent Labs Lab 06/27/14 2005  06/29/14 0110 06/30/14 0357  NA 141 142 142  K 4.3 3.8 4.1  CL 104 99 98  CO2 23 27 27   GLUCOSE 196* 146* 186*  BUN 21 31* 40*  CREATININE 1.03 1.13* 1.23*  CALCIUM 10.1 10.4 10.5   Liver Function Tests:  Recent Labs Lab 06/27/14 2005  AST 28  ALT 23  ALKPHOS 109  BILITOT 1.4*  PROT 7.9  ALBUMIN 3.6   No results for input(s): LIPASE, AMYLASE in the last 168 hours. No results for input(s): AMMONIA in the last 168 hours. CBC:  Recent Labs Lab 06/27/14 2005  WBC 5.6  NEUTROABS 4.1  HGB 10.7*  HCT 35.7*  MCV 89.0  PLT 144*   Cardiac Enzymes:  Recent Labs Lab 06/28/14 0617 06/28/14 1155 06/28/14 1317 06/28/14 1940 06/29/14 0110  TROPONINI <0.30 0.66* 0.52* 0.33* 0.42*   BNP (last 3 results)  Recent Labs  06/27/14 2030  PROBNP 1644.0*   CBG:  Recent Labs Lab 06/29/14 1540 06/29/14 2107 06/30/14 0256 06/30/14 0617 06/30/14 1127  GLUCAP 293* 304* 222* 193* 228*    No results found for this or any previous visit (from the past 240 hour(s)).   Studies: No results found.  Scheduled Meds: . acidophilus   Oral Q2200  . aspirin EC  81 mg Oral BID  . cholecalciferol  2,000  Units Oral Daily  . diltiazem  60 mg Oral 4 times per day  . enoxaparin (LOVENOX) injection  65 mg Subcutaneous Q24H  . furosemide  20 mg Intravenous BID  . gabapentin  600 mg Oral QHS  . insulin aspart  0-5 Units Subcutaneous QHS  . insulin pump   Subcutaneous TID AC, HS, 0200  . irbesartan  300 mg Oral Daily  . metoprolol  75 mg Oral BID  . omega-3 acid ethyl esters  1 g Oral BID  . ondansetron (ZOFRAN) IV  4 mg Intravenous 4 times per day  . pantoprazole  40 mg Oral Daily  . pravastatin  20 mg Oral Daily  . sodium chloride  3 mL Intravenous Q12H  . sodium chloride  3 mL Intravenous Q12H   Continuous Infusions:   Principal Problem:   Acute on chronic diastolic CHF (congestive heart failure) Active Problems:   Essential hypertension, benign   Chronic venous  insufficiency   Diabetes mellitus type 1 with peripheral artery disease   Aortic stenosis-moderate   CAD-minimal 2012   Chronic atrial fibrillation   Atrial fibrillation with rapid ventricular response   Elevated troponin  Time spent: 4min  Sharonna Vinje, [place] [facility] Pager [phone]. If 7PM-7AM, please contact night-coverage at [email or web address], password [facility] 06/30/2014, 2:49 PM  LOS: 3 days

## 2014-06-30 NOTE — Progress Notes (Addendum)
    Subjective:  SOB improving  Objective:  Vital Signs in the last 24 hours: Temp:  [98.2 F (36.8 C)-99 F (37.2 C)] 99 F (37.2 C) (11/11 0613) Pulse Rate:  [85-134] 99 (11/11 0613) Resp:  [18] 18 (11/11 0613) BP: (149-188)/(42-80) 158/42 mmHg (11/11 0613) SpO2:  [92 %-97 %] 97 % (11/10 1952) Weight:  [135 lb 5.8 oz (61.4 kg)] 135 lb 5.8 oz (61.4 kg) (11/11 6789)  Intake/Output from previous day:  Intake/Output Summary (Last 24 hours) at 06/30/14 0954 Last data filed at 06/30/14 0835  Gross per 24 hour  Intake    480 ml  Output   1150 ml  Net   -670 ml    Physical Exam: General appearance: alert, cooperative, no distress and frail Lungs: clear to auscultation bilaterally Heart: irregularly irregular rhythm and 2/6 systolic murmur Extremities: no edema   Rate: 110  Rhythm: atrial fibrillation  Lab Results:  Recent Labs  06/27/14 2005  WBC 5.6  HGB 10.7*  PLT 144*    Recent Labs  06/29/14 0110 06/30/14 0357  NA 142 142  K 3.8 4.1  CL 99 98  CO2 27 27  GLUCOSE 146* 186*  BUN 31* 40*  CREATININE 1.13* 1.23*    Recent Labs  06/28/14 1940 06/29/14 0110  TROPONINI 0.33* 0.42*   No results for input(s): INR in the last 72 hours.  Imaging: Imaging results have been reviewed  Cardiac Studies:  Assessment/Plan:   Principal Problem:   Acute on chronic diastolic CHF (congestive heart failure) Active Problems:   Atrial fibrillation with rapid ventricular response   Diabetes mellitus type 1 with peripheral artery disease   Aortic stenosis-moderate   Chronic atrial fibrillation   Essential hypertension, benign   Chronic venous insufficiency   CAD-minimal 2012   Elevated troponin    PLAN: Gentle diuresis, she is not a Coumadin candidate secondary to falls. Decrease Lasix-  Kerin Ransom PA-C Beeper [phone] 06/30/2014, 9:54 AM   Patient examined chart reviewed.  [name] frail, thin, elderly and falls a lot  No anticoagulation ( discussed  low dose eliquis with PA but decided this not appropriate either)  Breathing better Lasix dose adjusted.  Dressing on LE  Lungs clear anteriorly and moderate AS murmur  Jenkins Rouge

## 2014-07-01 LAB — BASIC METABOLIC PANEL
ANION GAP: 18 — AB (ref 5–15)
BUN: 60 mg/dL — ABNORMAL HIGH (ref 6–23)
CHLORIDE: 95 meq/L — AB (ref 96–112)
CO2: 25 meq/L (ref 19–32)
Calcium: 10.3 mg/dL (ref 8.4–10.5)
Creatinine, Ser: 1.77 mg/dL — ABNORMAL HIGH (ref 0.50–1.10)
GFR calc Af Amer: 29 mL/min — ABNORMAL LOW (ref >90)
GFR calc non Af Amer: 25 mL/min — ABNORMAL LOW (ref >90)
Glucose, Bld: 140 mg/dL — ABNORMAL HIGH (ref 70–99)
Potassium: 4.4 mEq/L (ref 3.7–5.3)
SODIUM: 138 meq/L (ref 137–147)

## 2014-07-01 LAB — CBC
HEMATOCRIT: 42.2 % (ref 36.0–46.0)
HEMOGLOBIN: 13.3 g/dL (ref 12.0–15.0)
MCH: 27.7 pg (ref 26.0–34.0)
MCHC: 31.5 g/dL (ref 30.0–36.0)
MCV: 87.9 fL (ref 78.0–100.0)
Platelets: 242 10*3/uL (ref 150–400)
RBC: 4.8 MIL/uL (ref 3.87–5.11)
RDW: 19.6 % — ABNORMAL HIGH (ref 11.5–15.5)
WBC: 13.2 10*3/uL — AB (ref 4.0–10.5)

## 2014-07-01 LAB — GLUCOSE, CAPILLARY
GLUCOSE-CAPILLARY: 163 mg/dL — AB (ref 70–99)
GLUCOSE-CAPILLARY: 236 mg/dL — AB (ref 70–99)
GLUCOSE-CAPILLARY: 169 mg/dL — AB (ref 70–99)
Glucose-Capillary: 242 mg/dL — ABNORMAL HIGH (ref 70–99)

## 2014-07-01 MED ORDER — TAMSULOSIN HCL 0.4 MG PO CAPS
0.4000 mg | ORAL_CAPSULE | Freq: Every day | ORAL | Status: DC
  Filled 2014-07-01: qty 1

## 2014-07-01 MED ORDER — FUROSEMIDE 10 MG/ML IJ SOLN
INTRAMUSCULAR | Status: AC
  Administered 2014-07-01: 20 mg via INTRAVENOUS
  Filled 2014-07-01: qty 4

## 2014-07-01 MED ORDER — ENOXAPARIN SODIUM 60 MG/0.6ML ~~LOC~~ SOLN
60.0000 mg | SUBCUTANEOUS | Status: DC
  Administered 2014-07-01 – 2014-07-03 (×3): 60 mg via SUBCUTANEOUS
  Filled 2014-07-01 (×4): qty 0.6

## 2014-07-01 NOTE — Progress Notes (Signed)
Inpatient Diabetes Program Recommendations  AACE/ADA: New Consensus Statement on Inpatient Glycemic Control (2013)  Target Ranges:  Prepandial:   less than 140 mg/dL      Peak postprandial:   less than 180 mg/dL (1-2 hours)      Critically ill patients:  140 - 180 mg/dL   Results for Emma Yu, Emma Yu (MRN [id number]) as of 07/01/2014 08:55  Ref. Range 06/29/2014 15:40 06/29/2014 21:07 06/30/2014 02:56 06/30/2014 06:17 06/30/2014 11:27 06/30/2014 16:39 06/30/2014 21:26 07/01/2014 05:46  Glucose-Capillary Latest Range: 70-99 mg/dL 293 (H) 304 (H) 222 (H) 193 (H) 228 (H) 248 (H) 238 (H) 163 (H)   Reason for assessment: insulin pump use as an inpatient  Diabetes history: Type 2 Outpatient Diabetes medications: Animas insulin pump- total daily dose- 70 units Current orders for Inpatient glycemic control: Using insulin pump brought in from home  Reviewed notes. Patient has Animas insulin pump on. Contract signed. Spoke with RN this am who states the patient is alert and oriented. Note patien'ts A1C is 6.7% on 06/28/14. CBG checked this am and patient gave herself 23 units of bolus insulin based on CBG and what she ate (almost 100%). Asked RN to be alert for low blood sugars and to keep juice at patient bedside/ monitor frequently.    Gentry Fitz, RN, BA, MHA, CDE Diabetes Coordinator Inpatient Diabetes Program  [phone] (Team Pager) [phone] [facility]) 07/01/2014 9:00 AM

## 2014-07-01 NOTE — Progress Notes (Signed)
UR completed Deolinda Frid K. Greydon Betke, RN, BSN, [place], CCM  07/01/2014 3:41 PM

## 2014-07-01 NOTE — Progress Notes (Signed)
Note from Dr. Loanne Drilling, office visit on 06/14/14;  "  she has a one-touch ping insulin pump; she has declined continuous glucose monitor Interval history: She averages a total of approx 60 units of humalog per day, via her pump. She uses these settings: basal rate of 0.8 unit/hr, 24 hrs per day.  bolus of 11 units with each meal.  correction bolus (which some people call "sensitivity," or "insulin sensitivity ratio," or just "isr") of 1 unit for each by 25 which your glucose exceeds 120."  Gentry Fitz, RN, [place], [place], CDE Diabetes Coordinator Inpatient Diabetes Program  [phone] (Team Pager) [phone] [facility]) 07/01/2014 1:09 PM

## 2014-07-01 NOTE — Plan of Care (Signed)
Problem: Phase I Progression Outcomes Goal: Dyspnea controlled at rest (HF) Outcome: Completed/Met Date Met:  07/01/14 Goal: Pain controlled with appropriate interventions Outcome: Completed/Met Date Met:  07/01/14 Goal: Up in chair, BRP Outcome: Completed/Met Date Met:  07/01/14 Goal: Initial discharge plan identified Outcome: Completed/Met Date Met:  07/01/14 Goal: Hemodynamically stable Outcome: Completed/Met Date Met:  07/01/14 Goal: Other Phase I Outcomes/Goals Outcome: Not Applicable Date Met:  54/09/81  Problem: Phase II Progression Outcomes Goal: Pain controlled Outcome: Completed/Met Date Met:  07/01/14 Goal: Dyspnea controlled with activity Outcome: Completed/Met Date Met:  07/01/14 Goal: Walk in hall or up in chair TID Outcome: Completed/Met Date Met:  07/01/14 Goal: Discharge plan established Outcome: Completed/Met Date Met:  07/01/14 Goal: Tolerating diet Outcome: Completed/Met Date Met:  07/01/14 Goal: Fluid volume status improved Outcome: Completed/Met Date Met:  07/01/14 Goal: Case manager referral Outcome: Completed/Met Date Met:  07/01/14 Goal: Begin discharge teaching Outcome: Completed/Met Date Met:  07/01/14 Goal: Other Phase II Outcomes/Goals Outcome: Not Applicable Date Met:  19/14/78

## 2014-07-01 NOTE — Clinical Documentation Improvement (Signed)
Possible Clinical Conditions?  Acute Renal Failure/Acute Kidney Injury Acute Tubular Necrosis Acute on Chronic Renal Failure Chronic Renal Failure Other Condition Cannot Clinically Determine   Supporting Information: Signs and Symptoms: Diagnostics: Component     Latest Ref Rng 06/27/2014 06/29/2014 06/30/2014 07/01/2014  BUN     6 - 23 mg/dL 21 31 (H) 40 (H) 60 (H)  Creatinine     0.50 - 1.10 mg/dL 1.03 1.13 (H) 1.23 (H) 1.77 (H)  Calcium     8.4 - 10.5 mg/dL 10.1 10.4 10.5 10.3  GFR calc non Af Amer     >90 mL/min 48 (L)  43 (L) 39 (L) 25 (L)  GFR calc Af Amer     >90 mL/min 56 (L) 50 (L) 45 (L) 29 (L)  Anion gap    5 - 15 14 16  (H) 17 (H) 18 (H)   Thank You, Alessandra Grout, RN, BSN, CCDS,Clinical Documentation Specialist:  [phone]  [phone]=[facility] [place]- Health Information Management

## 2014-07-01 NOTE — Progress Notes (Signed)
Physical Therapy Treatment Patient Details Name: Emma Yu MRN: [id number] DOB: [birth year] Today's Date: 14-Jul-2014    History of Present Illness Patient is a 80 y/o female with PMH of CAD , CHF (EF 55-60%), mitral stenosis, Dm2, PAD, A-fib and HTN who complains of SOB for a couple of weeks. Pt with acute on chronic diastolic heart failure.    PT Comments    Pt with much improved mobility and endurance.  Follow Up Recommendations  Home health PT;Supervision - Intermittent     Equipment Recommendations  None recommended by PT    Recommendations for Other Services       Precautions / Restrictions Precautions Precautions: Fall Precaution Comments: Monitor 02 and HR    Mobility  Bed Mobility                  Transfers Overall transfer level: Needs assistance Equipment used: 4-wheeled walker Transfers: Sit to/from Stand Sit to Stand: Supervision            Ambulation/Gait Ambulation/Gait assistance: Supervision Ambulation Distance (Feet): 90 Feet (90' x 1, 50' x 1) Assistive device: 4-wheeled walker Gait Pattern/deviations: Step-through pattern;Decreased step length - right;Decreased step length - left;Trunk flexed   Gait velocity interpretation: Below normal speed for age/gender General Gait Details: Verbal cues to stand more erect. 1 sitting rest breaks. SaO2 94% after amb on RA.   Stairs            Wheelchair Mobility    Modified Rankin (Stroke Patients Only)       Balance Overall balance assessment: Needs assistance Sitting-balance support: No upper extremity supported;Feet supported Sitting balance-Leahy Scale: Good     Standing balance support: No upper extremity supported Standing balance-Leahy Scale: Fair                      Cognition Arousal/Alertness: Awake/alert Behavior During Therapy: WFL for tasks assessed/performed Overall Cognitive Status: Within Functional Limits for tasks assessed                       Exercises      General Comments        Pertinent Vitals/Pain Pain Assessment: No/denies pain    Home Living                      Prior Function            PT Goals (current goals can now be found in the care plan section) Progress towards PT goals: Progressing toward goals    Frequency  Min 3X/week    PT Plan Current plan remains appropriate    Co-evaluation             End of Session   Activity Tolerance: Patient tolerated treatment well Patient left: in chair;with call bell/phone within reach     Time: 0855-0922 PT Time Calculation (min) (ACUTE ONLY): 27 min  Charges:  $Gait Training: 23-37 mins                    G Codes:      Emma Yu 07-14-14, 9:34 AM  [facility] PT [phone]

## 2014-07-01 NOTE — Progress Notes (Signed)
[facility] PROGRESS NOTE  Emma Yu IRC:[id number] DOB: 03-Jul-1927 DOA: 06/27/2014 PCP: [name], MD    Assessment/Plan: 1. Likely acutely decompensated diastolic CHF 1. Cardiology following 2. Wt remains at 61kg ->this is her dry weight 3. Net neg 6.3L since admit 4. On BID IV lasix initially, since changed to po dosing 2. Acute renal failure 1. Likely secondary to above diuresis 2. Decreased diuresis per above 3. Monitor renal function 3. Afib 1. Rate controlled 2. On cardizem and beta blocker 4. HTN 1. BP stable, albeit suboptimal 2. Increased metoprolol to 100mg  bid 3. Cont cardizem 5. DM 1. Pt is on insulin gtt 2. Glucose has remained relatively stable 3. Cont for now 6. DVT prophylaixs 1. SubQ lovenox  Code Status: Full Family Communication: Pt in room  Disposition Plan: Pending  I/O last 3 completed shifts: In: 61 [P.O.:660] Out: 1250 [Urine:1250] Total I/O In: 3 [I.V.:3] Out: 100 [Urine:100]  Weight change: -0.4 kg (-14.1 oz)  Consultants:  Cardiology  Procedures:    Antibiotics:   (indicate start date, and stop date if known)  HPI/Subjective: No complaints. Eager to go home  Objective: Filed Vitals:   07/01/14 0901 07/01/14 0903 07/01/14 1003 07/01/14 1028  BP:   114/49   Pulse: 86 108 97 104  Temp:   98.4 F (36.9 C)   TempSrc:   Oral   Resp:      Height:      Weight:      SpO2: 97% 94% 90% 95%    Intake/Output Summary (Last 24 hours) at 07/01/14 1354 Last data filed at 07/01/14 1048  Gross per 24 hour  Intake    543 ml  Output    425 ml  Net    118 ml   Filed Weights   06/29/14 0517 06/30/14 0613 07/01/14 0540  Weight: 62.1 kg (136 lb 14.5 oz) 61.4 kg (135 lb 5.8 oz) 61 kg (134 lb 7.7 oz)    Exam:   General:  Awake, in nad  Cardiovascular: regular, s1, s2  Respiratory: normal resp effort, no wheezing  Abdomen: soft,nondistended  Musculoskeletal: perfused, no clubbing   Data  Reviewed: Basic Metabolic Panel:  Recent Labs Lab 06/27/14 2005 06/29/14 0110 06/30/14 0357 07/01/14 0410  NA 141 142 142 138  K 4.3 3.8 4.1 4.4  CL 104 99 98 95*  CO2 23 27 27 25   GLUCOSE 196* 146* 186* 140*  BUN 21 31* 40* 60*  CREATININE 1.03 1.13* 1.23* 1.77*  CALCIUM 10.1 10.4 10.5 10.3   Liver Function Tests:  Recent Labs Lab 06/27/14 2005  AST 28  ALT 23  ALKPHOS 109  BILITOT 1.4*  PROT 7.9  ALBUMIN 3.6   No results for input(s): LIPASE, AMYLASE in the last 168 hours. No results for input(s): AMMONIA in the last 168 hours. CBC:  Recent Labs Lab 06/27/14 2005 07/01/14 0410  WBC 5.6 13.2*  NEUTROABS 4.1  --   HGB 10.7* 13.3  HCT 35.7* 42.2  MCV 89.0 87.9  PLT 144* 242   Cardiac Enzymes:  Recent Labs Lab 06/28/14 0617 06/28/14 1155 06/28/14 1317 06/28/14 1940 06/29/14 0110  TROPONINI <0.30 0.66* 0.52* 0.33* 0.42*   BNP (last 3 results)  Recent Labs  06/27/14 2030  PROBNP 1644.0*   CBG:  Recent Labs Lab 06/30/14 1127 06/30/14 1639 06/30/14 2126 07/01/14 0546 07/01/14 1059  GLUCAP 228* 248* 238* 163* 236*    No results found for this or any previous visit (from the past  240 hour(s)).   Studies: No results found.  Scheduled Meds: . acidophilus   Oral Q2200  . aspirin EC  81 mg Oral BID  . cholecalciferol  2,000 Units Oral Daily  . diltiazem  60 mg Oral 4 times per day  . enoxaparin (LOVENOX) injection  60 mg Subcutaneous Q24H  . gabapentin  600 mg Oral QHS  . insulin aspart  0-5 Units Subcutaneous QHS  . insulin pump   Subcutaneous TID AC, HS, 0200  . irbesartan  300 mg Oral Daily  . metoprolol  100 mg Oral BID  . omega-3 acid ethyl esters  1 g Oral BID  . ondansetron (ZOFRAN) IV  4 mg Intravenous 4 times per day  . pantoprazole  40 mg Oral Daily  . pravastatin  20 mg Oral Daily  . sodium chloride  3 mL Intravenous Q12H  . sodium chloride  3 mL Intravenous Q12H   Continuous Infusions:   Principal Problem:   Acute on  chronic diastolic CHF (congestive heart failure) Active Problems:   Essential hypertension, benign   Chronic venous insufficiency   Diabetes mellitus type 1 with peripheral artery disease   Aortic stenosis-moderate   CAD-minimal 2012   Chronic atrial fibrillation   Atrial fibrillation with rapid ventricular response   Elevated troponin  Time spent: 51min  Travious Vanover, [place] [facility] Pager [phone]. If 7PM-7AM, please contact night-coverage at [email or web address], password [facility] 07/01/2014, 1:54 PM  LOS: 4 days

## 2014-07-01 NOTE — Progress Notes (Signed)
Patient Name: Emma Yu Date of Encounter: 07/01/2014     Principal Problem:   Acute on chronic diastolic CHF (congestive heart failure) Active Problems:   Essential hypertension, benign   Chronic venous insufficiency   Diabetes mellitus type 1 with peripheral artery disease   Aortic stenosis-moderate   CAD-minimal 2012   Chronic atrial fibrillation   Atrial fibrillation with rapid ventricular response   Elevated troponin    SUBJECTIVE  Denies any SOB or CP.   CURRENT MEDS . acidophilus   Oral Q2200  . aspirin EC  81 mg Oral BID  . cholecalciferol  2,000 Units Oral Daily  . diltiazem  60 mg Oral 4 times per day  . enoxaparin (LOVENOX) injection  65 mg Subcutaneous Q24H  . furosemide  20 mg Intravenous BID  . gabapentin  600 mg Oral QHS  . insulin aspart  0-5 Units Subcutaneous QHS  . insulin pump   Subcutaneous TID AC, HS, 0200  . irbesartan  300 mg Oral Daily  . metoprolol  100 mg Oral BID  . omega-3 acid ethyl esters  1 g Oral BID  . ondansetron (ZOFRAN) IV  4 mg Intravenous 4 times per day  . pantoprazole  40 mg Oral Daily  . pravastatin  20 mg Oral Daily  . sodium chloride  3 mL Intravenous Q12H  . sodium chloride  3 mL Intravenous Q12H  . tamsulosin  0.4 mg Oral Daily    OBJECTIVE  Filed Vitals:   07/01/14 0015 07/01/14 0540 07/01/14 0901 07/01/14 0903  BP: 155/52 158/40    Pulse: 82 89 86 108  Temp:  98.1 F (36.7 C)    TempSrc:  Oral    Resp:      Height:      Weight:  134 lb 7.7 oz (61 kg)    SpO2:  96% 97% 94%    Intake/Output Summary (Last 24 hours) at 07/01/14 0938 Last data filed at 07/01/14 0500  Gross per 24 hour  Intake    540 ml  Output    600 ml  Net    -60 ml   Filed Weights   06/29/14 0517 06/30/14 0613 07/01/14 0540  Weight: 136 lb 14.5 oz (62.1 kg) 135 lb 5.8 oz (61.4 kg) 134 lb 7.7 oz (61 kg)    PHYSICAL EXAM  General: Pleasant, NAD. Neuro: Alert and oriented X 3. Moves all extremities spontaneously. Psych:  Normal affect. HEENT:  Normal  Neck: Supple without bruits or JVD. Lungs:  Resp regular and unlabored, CTA. Heart: irregular no s3, s4. 1/6 systolic murmur Abdomen: Soft, non-tender, non-distended, BS + x 4.  Extremities: No clubbing, cyanosis or edema. DP/PT/Radials 2+ and equal bilaterally.  Accessory Clinical Findings  CBC  Recent Labs  07/01/14 0410  WBC 13.2*  HGB 13.3  HCT 42.2  MCV 87.9  PLT 428   Basic Metabolic Panel  Recent Labs  06/30/14 0357 07/01/14 0410  NA 142 138  K 4.1 4.4  CL 98 95*  CO2 27 25  GLUCOSE 186* 140*  BUN 40* 60*  CREATININE 1.23* 1.77*  CALCIUM 10.5 10.3   Liver Function Tests No results for input(s): AST, ALT, ALKPHOS, BILITOT, PROT, ALBUMIN in the last 72 hours. No results for input(s): LIPASE, AMYLASE in the last 72 hours. Cardiac Enzymes  Recent Labs  06/28/14 1317 06/28/14 1940 06/29/14 0110  TROPONINI 0.52* 0.33* 0.42*    TELE A-fib with HR 80s-90s, occasional HR increase to >100  ECG  No new EKG  Echocardiogram 06/28/2014  LV EF: 65% -  70%  ------------------------------------------------------------------- Indications:   CHF - 428.0.  ------------------------------------------------------------------- History:  PMH:  Coronary artery disease. Risk factors: Former tobacco use. Hypertension. Diabetes mellitus.  ------------------------------------------------------------------- Study Conclusions  - Left ventricle: The cavity size was normal. There was severe concentric hypertrophy. Systolic function was vigorous. The estimated ejection fraction was in the range of 65% to 70%. Wall motion was normal; there were no regional wall motion abnormalities. - Aortic valve: Cusp separation was reduced. There was mild stenosis. There was mild regurgitation. Peak velocity (S): 226 cm/s. Mean gradient (S): 11 mm Hg. Valve area (VTI): 1.36 cm^2. Valve area (Vmax): 1.37 cm^2. Valve area  (Vmean): 1.4 cm^2. - Mitral valve: Severely calcified annulus. Mobility of the posterior leaflet was restricted. The findings are consistent with mild stenosis. There was mild regurgitation. Valve area by continuity equation (using LVOT flow): 1.09 cm^2. - Left atrium: The atrium was moderately dilated. - Pulmonary arteries: Systolic pressure was mildly increased. PA peak pressure: 43 mm Hg (S).    Radiology/Studies  Dg Chest 1 View  06/27/2014   CLINICAL DATA:  SOB, Hx of HTN, diabetes. Went to urgent care in [place] for a large blister on left lower leg, doctor told her if she got SOB to go to ER  EXAM: CHEST - 1 VIEW  COMPARISON:  04/19/2014  FINDINGS: Cardiac silhouette is mildly enlarged. No mediastinal or hilar masses. There is vascular congestion and mild interstitial thickening most evident in the lung bases. Mild hazy opacity is also noted in both lung bases, likely atelectasis. No evidence of pneumonia.  Bony thorax is demineralized but grossly intact.  IMPRESSION: 1. Mild congestive heart failure.   Electronically Signed   By: Lajean Manes M.D.   On: 06/27/2014 21:02    ASSESSMENT AND PLAN  1. Acute on chronic diastolic CHF (congestive heart failure)  - 2/2 noncompliance as pt stopped lasix because she did not want to going to bathroom  - also noncompliant with Na and fluid intake  - Cr increased to 1.7 after aggressive diuresis, D/C lasix, allow kidney to rest today, start 20mg  PO lasix tomorrow before discharge  2. Chronic atrial fibrillation   - not a candidate for systemic anticoagulation given falls and frailty  - started on 60mg  Q6hr diltiazem, good rate control. Recommend convert to 24 mg long acting diltiazem. ?why patient has an allergy toward diltiazem, she has not complained of any significant CP since arrival.  3. Diabetes mellitus type 1 with peripheral artery disease 4.Aortic stenosis-moderate 5. Essential hypertension 6. Chronic venous  insufficiency 7. CAD-minimal 2012 8. Elevated troponin  Signed, Woodward Ku Pager: [phone]  Patient examined chart reviewed should be ready for d/c in am  Home dose PO lasix in am Lungs clear and no edema in legs

## 2014-07-02 DIAGNOSIS — I5020 Unspecified systolic (congestive) heart failure: Secondary | ICD-10-CM

## 2014-07-02 DIAGNOSIS — R0602 Shortness of breath: Secondary | ICD-10-CM

## 2014-07-02 LAB — BASIC METABOLIC PANEL
Anion gap: 12 (ref 5–15)
BUN: 73 mg/dL — AB (ref 6–23)
CALCIUM: 10.1 mg/dL (ref 8.4–10.5)
CO2: 31 mEq/L (ref 19–32)
CREATININE: 1.87 mg/dL — AB (ref 0.50–1.10)
Chloride: 97 mEq/L (ref 96–112)
GFR, EST AFRICAN AMERICAN: 27 mL/min — AB (ref >90)
GFR, EST NON AFRICAN AMERICAN: 23 mL/min — AB (ref >90)
Glucose, Bld: 151 mg/dL — ABNORMAL HIGH (ref 70–99)
Potassium: 4.8 mEq/L (ref 3.7–5.3)
Sodium: 140 mEq/L (ref 137–147)

## 2014-07-02 LAB — GLUCOSE, CAPILLARY
GLUCOSE-CAPILLARY: 147 mg/dL — AB (ref 70–99)
GLUCOSE-CAPILLARY: 292 mg/dL — AB (ref 70–99)
GLUCOSE-CAPILLARY: 171 mg/dL — AB (ref 70–99)
Glucose-Capillary: 289 mg/dL — ABNORMAL HIGH (ref 70–99)
Glucose-Capillary: 202 mg/dL — ABNORMAL HIGH (ref 70–99)

## 2014-07-02 MED ORDER — DILTIAZEM HCL ER COATED BEADS 240 MG PO CP24
240.0000 mg | ORAL_CAPSULE | Freq: Every day | ORAL | Status: DC
  Administered 2014-07-02 – 2014-07-03 (×2): 240 mg via ORAL
  Filled 2014-07-02 (×2): qty 1

## 2014-07-02 MED ORDER — FUROSEMIDE 20 MG PO TABS
20.0000 mg | ORAL_TABLET | Freq: Every day | ORAL | Status: DC
  Administered 2014-07-03: 20 mg via ORAL
  Filled 2014-07-02: qty 1

## 2014-07-02 NOTE — Progress Notes (Signed)
Pt.A/Ox3 and is ambulatory with 1 person assist and walker. Pt.had no c/o pain. Diabetes education coordinator Suanne Marker was called and notified about patient's insulin pump and came to the nursing unit assist. Later during the shift patient appeared sleepy but was arousable to voice. CBG was taken and was 202. VS taken. DBP was low and attending MD was called and notified. Was told to hold 2200 dose of metoprolol 100 mg po only for tonight and restart again in the am.

## 2014-07-02 NOTE — Progress Notes (Signed)
Physical Therapy Treatment Patient Details Name: SHATASHA LAMBING MRN: [id number] DOB: Feb 03, 1927 Today's Date: 2014-07-11    History of Present Illness Patient is a 80 y/o female with PMH of CAD , CHF (EF 55-60%), mitral stenosis, Dm2, PAD, A-fib and HTN who complains of SOB for a couple of weeks. Pt with acute on chronic diastolic heart failure.    PT Comments    Pt making good progress.  Follow Up Recommendations  Home health PT;Supervision - Intermittent     Equipment Recommendations  None recommended by PT    Recommendations for Other Services       Precautions / Restrictions Precautions Precautions: Fall    Mobility  Bed Mobility                  Transfers Overall transfer level: Needs assistance Equipment used: 4-wheeled walker Transfers: Sit to/from Stand Sit to Stand: Supervision            Ambulation/Gait Ambulation/Gait assistance: Supervision Ambulation Distance (Feet): 90 Feet (90' x 1, 50' x 2) Assistive device: 4-wheeled walker     Gait velocity interpretation: Below normal speed for age/gender General Gait Details: Pt took 2 sitting rest breaks. Verbal cues to stand more erect. Pt leaning forward on rollator.   Stairs            Wheelchair Mobility    Modified Rankin (Stroke Patients Only)       Balance   Sitting-balance support: No upper extremity supported Sitting balance-Leahy Scale: Good     Standing balance support: No upper extremity supported;During functional activity Standing balance-Leahy Scale: Fair                      Cognition Arousal/Alertness: Awake/alert Behavior During Therapy: WFL for tasks assessed/performed Overall Cognitive Status: Within Functional Limits for tasks assessed                      Exercises      General Comments        Pertinent Vitals/Pain Pain Assessment: No/denies pain    Home Living                      Prior Function             PT Goals (current goals can now be found in the care plan section) Progress towards PT goals: Progressing toward goals    Frequency  Min 3X/week    PT Plan Current plan remains appropriate    Co-evaluation             End of Session   Activity Tolerance: Patient tolerated treatment well Patient left: in chair;with call bell/phone within reach     Time: 1339-1403 PT Time Calculation (min) (ACUTE ONLY): 24 min  Charges:  $Gait Training: 23-37 mins                    G Codes:      Alissia Lory 2014-07-11, 2:10 PM  [employer] PT [phone]

## 2014-07-02 NOTE — Progress Notes (Signed)
[facility] PROGRESS NOTE  Emma Yu LZJ:[id number] DOB: 1927-08-16 DOA: 06/27/2014 PCP: Emma Calico, MD    Assessment/Plan: 1. Likely acutely decompensated diastolic CHF 1. Cardiology following 2. Wt remains at 61kg ->this is her dry weight 3. Net neg 6.3L since admit 4. Was on BID IV lasix initially, lasix on hold per below 2. Acute renal failure 1. Likely secondary to above diuresis 2. Cr trending up 3. Holding lasix 4. Monitor renal function 3. Afib 1. Rate controlled 2. On cardizem and beta blocker 4. HTN 1. BP stable, albeit suboptimal 2. Increased metoprolol to 100mg  bid 3. Cont cardizem 5. DM 1. Pt is on insulin gtt 2. Glucose has remained relatively stable 3. Cont for now 6. DVT prophylaixs 1. SubQ lovenox  Code Status: Full Family Communication: Pt in room  Disposition Plan: Pending  I/O last 3 completed shifts: In: 0240 [P.O.:1020; I.V.:3] Out: 300 [Urine:300] Total I/O In: 290 [P.O.:290] Out: 400 [Urine:400]  Weight change: 0.508 kg (1 lb 1.9 oz)  Consultants:  Cardiology  Procedures:    Antibiotics:    HPI/Subjective: Pt is very eager to go home. No acute events noted.   Objective: Filed Vitals:   07/01/14 2052 07/02/14 0600 07/02/14 1101 07/02/14 1300  BP: 128/52 149/47 127/40 125/55  Pulse: 76 77 89 82  Temp: 99.3 F (37.4 C) 98.1 F (36.7 C)  98 F (36.7 C)  TempSrc: Oral Oral  Oral  Resp: 17 18  18   Height:      Weight:  61.508 kg (135 lb 9.6 oz)    SpO2: 93% 94% 96% 96%    Intake/Output Summary (Last 24 hours) at 07/02/14 1630 Last data filed at 07/02/14 1300  Gross per 24 hour  Intake    530 ml  Output    400 ml  Net    130 ml   Filed Weights   06/30/14 0613 07/01/14 0540 07/02/14 0600  Weight: 61.4 kg (135 lb 5.8 oz) 61 kg (134 lb 7.7 oz) 61.508 kg (135 lb 9.6 oz)    Exam:   General:  Awake, in nad  Cardiovascular: regular, s1, s2  Respiratory: normal resp effort, no wheezing  Abdomen:  soft,nondistended  Musculoskeletal: perfused, no clubbing   Data Reviewed: Basic Metabolic Panel:  Recent Labs Lab 06/27/14 2005 06/29/14 0110 06/30/14 0357 07/01/14 0410 07/02/14 0400  NA 141 142 142 138 140  K 4.3 3.8 4.1 4.4 4.8  CL 104 99 98 95* 97  CO2 23 27 27 25 31   GLUCOSE 196* 146* 186* 140* 151*  BUN 21 31* 40* 60* 73*  CREATININE 1.03 1.13* 1.23* 1.77* 1.87*  CALCIUM 10.1 10.4 10.5 10.3 10.1   Liver Function Tests:  Recent Labs Lab 06/27/14 2005  AST 28  ALT 23  ALKPHOS 109  BILITOT 1.4*  PROT 7.9  ALBUMIN 3.6   No results for input(s): LIPASE, AMYLASE in the last 168 hours. No results for input(s): AMMONIA in the last 168 hours. CBC:  Recent Labs Lab 06/27/14 2005 07/01/14 0410  WBC 5.6 13.2*  NEUTROABS 4.1  --   HGB 10.7* 13.3  HCT 35.7* 42.2  MCV 89.0 87.9  PLT 144* 242   Cardiac Enzymes:  Recent Labs Lab 06/28/14 0617 06/28/14 1155 06/28/14 1317 06/28/14 1940 06/29/14 0110  TROPONINI <0.30 0.66* 0.52* 0.33* 0.42*   BNP (last 3 results)  Recent Labs  06/27/14 2030  PROBNP 1644.0*   CBG:  Recent Labs Lab 07/01/14 1624 07/01/14 2132 07/02/14 0138 07/02/14  6269 07/02/14 1127  GLUCAP 169* 242* 289* 147* 292*    No results found for this or any previous visit (from the past 240 hour(s)).   Studies: No results found.  Scheduled Meds: . acidophilus   Oral Q2200  . aspirin EC  81 mg Oral BID  . cholecalciferol  2,000 Units Oral Daily  . diltiazem  240 mg Oral Daily  . enoxaparin (LOVENOX) injection  60 mg Subcutaneous Q24H  . [START ON 07/03/2014] furosemide  20 mg Oral Daily  . gabapentin  600 mg Oral QHS  . insulin aspart  0-5 Units Subcutaneous QHS  . insulin pump   Subcutaneous TID AC, HS, 0200  . irbesartan  300 mg Oral Daily  . metoprolol  100 mg Oral BID  . omega-3 acid ethyl esters  1 g Oral BID  . ondansetron (ZOFRAN) IV  4 mg Intravenous 4 times per day  . pantoprazole  40 mg Oral Daily  . pravastatin   20 mg Oral Daily  . sodium chloride  3 mL Intravenous Q12H  . sodium chloride  3 mL Intravenous Q12H   Continuous Infusions:   Principal Problem:   Acute on chronic diastolic CHF (congestive heart failure) Active Problems:   Essential hypertension, benign   Chronic venous insufficiency   Diabetes mellitus type 1 with peripheral artery disease   Aortic stenosis-moderate   CAD-minimal 2012   Chronic atrial fibrillation   Atrial fibrillation with rapid ventricular response   Elevated troponin  Time spent: 68min  Emma Yu, [facility] Pager [phone]. If 7PM-7AM, please contact night-coverage at [email or web address], password [facility] 07/02/2014, 4:30 PM  LOS: 5 days

## 2014-07-02 NOTE — Progress Notes (Signed)
Patient Name: Emma Yu Date of Encounter: 07/02/2014     Principal Problem:   Acute on chronic diastolic CHF (congestive heart failure) Active Problems:   Essential hypertension, benign   Chronic venous insufficiency   Diabetes mellitus type 1 with peripheral artery disease   Aortic stenosis-moderate   CAD-minimal 2012   Chronic atrial fibrillation   Atrial fibrillation with rapid ventricular response   Elevated troponin    SUBJECTIVE  Denies any CP or SOB  CURRENT MEDS . acidophilus   Oral Q2200  . aspirin EC  81 mg Oral BID  . cholecalciferol  2,000 Units Oral Daily  . diltiazem  60 mg Oral 4 times per day  . enoxaparin (LOVENOX) injection  60 mg Subcutaneous Q24H  . gabapentin  600 mg Oral QHS  . insulin aspart  0-5 Units Subcutaneous QHS  . insulin pump   Subcutaneous TID AC, HS, 0200  . irbesartan  300 mg Oral Daily  . metoprolol  100 mg Oral BID  . omega-3 acid ethyl esters  1 g Oral BID  . ondansetron (ZOFRAN) IV  4 mg Intravenous 4 times per day  . pantoprazole  40 mg Oral Daily  . pravastatin  20 mg Oral Daily  . sodium chloride  3 mL Intravenous Q12H  . sodium chloride  3 mL Intravenous Q12H    OBJECTIVE  Filed Vitals:   07/01/14 1028 07/01/14 1359 07/01/14 2052 07/02/14 0600  BP:  93/46 128/52 149/47  Pulse: 104 76 76 77  Temp:  97.5 F (36.4 C) 99.3 F (37.4 C) 98.1 F (36.7 C)  TempSrc:  Axillary Oral Oral  Resp:  16 17 18   Height:      Weight:    135 lb 9.6 oz (61.508 kg)  SpO2: 95% 98% 93% 94%    Intake/Output Summary (Last 24 hours) at 07/02/14 0943 Last data filed at 07/01/14 1742  Gross per 24 hour  Intake    843 ml  Output    100 ml  Net    743 ml   Filed Weights   06/30/14 0613 07/01/14 0540 07/02/14 0600  Weight: 135 lb 5.8 oz (61.4 kg) 134 lb 7.7 oz (61 kg) 135 lb 9.6 oz (61.508 kg)    PHYSICAL EXAM  General: Pleasant, NAD. Neuro: Alert and oriented X 3. Moves all extremities spontaneously. Psych: Normal  affect. HEENT:  Normal  Neck: Supple without bruits or JVD. Lungs:  Resp regular and unlabored, CTA. Heart: regularly irregular. no s3, s4, or murmurs. Abdomen: Soft, non-tender, non-distended, BS + x 4.  Extremities: No clubbing, cyanosis or edema. DP/PT/Radials 2+ and equal bilaterally.  Accessory Clinical Findings  CBC  Recent Labs  07/01/14 0410  WBC 13.2*  HGB 13.3  HCT 42.2  MCV 87.9  PLT 412   Basic Metabolic Panel  Recent Labs  07/01/14 0410 07/02/14 0400  NA 138 140  K 4.4 4.8  CL 95* 97  CO2 25 31  GLUCOSE 140* 151*  BUN 60* 73*  CREATININE 1.77* 1.87*  CALCIUM 10.3 10.1    TELE A-fib with HR 60-70s, occasional bradycardia down to 40s    ECG  No new ekg  Echocardiogram 06/28/2014  LV EF: 65% -  70%  ------------------------------------------------------------------- Indications:   CHF - 428.0.  ------------------------------------------------------------------- History:  PMH:  Coronary artery disease. Risk factors: Former tobacco use. Hypertension. Diabetes mellitus.  ------------------------------------------------------------------- Study Conclusions  - Left ventricle: The cavity size was normal. There was severe concentric hypertrophy.  Systolic function was vigorous. The estimated ejection fraction was in the range of 65% to 70%. Wall motion was normal; there were no regional wall motion abnormalities. - Aortic valve: Cusp separation was reduced. There was mild stenosis. There was mild regurgitation. Peak velocity (S): 226 cm/s. Mean gradient (S): 11 mm Hg. Valve area (VTI): 1.36 cm^2. Valve area (Vmax): 1.37 cm^2. Valve area (Vmean): 1.4 cm^2. - Mitral valve: Severely calcified annulus. Mobility of the posterior leaflet was restricted. The findings are consistent with mild stenosis. There was mild regurgitation. Valve area by continuity equation (using LVOT flow): 1.09 cm^2. - Left atrium: The atrium was  moderately dilated. - Pulmonary arteries: Systolic pressure was mildly increased. PA peak pressure: 43 mm Hg (S).    Radiology/Studies  Dg Chest 1 View  06/27/2014   CLINICAL DATA:  SOB, Hx of HTN, diabetes. Went to urgent care in [place] for a large blister on left lower leg, doctor told her if she got SOB to go to ER  EXAM: CHEST - 1 VIEW  COMPARISON:  04/19/2014  FINDINGS: Cardiac silhouette is mildly enlarged. No mediastinal or hilar masses. There is vascular congestion and mild interstitial thickening most evident in the lung bases. Mild hazy opacity is also noted in both lung bases, likely atelectasis. No evidence of pneumonia.  Bony thorax is demineralized but grossly intact.  IMPRESSION: 1. Mild congestive heart failure.   Electronically Signed   By: Lajean Manes M.D.   On: 06/27/2014 21:02    ASSESSMENT AND PLAN  1. Acute on chronic diastolic CHF (congestive heart failure) - 2/2 noncompliance as pt stopped lasix because she did not want to going to bathroom - also noncompliant with Na and fluid intake - Cr continue to go up, likely related to aggressive diuresis, expect it will eventually plateau before return to baseline. Hold lasix for 1 more day, restart tomorrow. Potential discharge either today or tomorrow. Dry weight 61 kg or 135 lbs.   2. Chronic atrial fibrillation  - not a candidate for systemic anticoagulation given falls and frailty - discussed with pharmacist regarding diltiazem and question whether it was true allergy as pt has been tolerating diltiazem since admission without significant CP, pt denies having allergic reaction to it, will convert diltiazem to long acting 240 mg daily today  3. Diabetes mellitus type 1 with peripheral artery disease 4.Aortic stenosis-moderate 5. Essential hypertension 6. Chronic venous insufficiency 7. CAD-minimal 2012 8. Elevated troponin  Signed, Almyra Deforest  PA-C Pager: [phone]  Patient examined chart reviewed Agree with above.  Start LA cardizem CD 240 mg  No anticoagulation due to fall risk And age.  Appears dry holding lasix follow BMET.  Exam with dressng on RLE AS murmur and atelectasis at lung bases  Jenkins Rouge

## 2014-07-03 DIAGNOSIS — I2510 Atherosclerotic heart disease of native coronary artery without angina pectoris: Secondary | ICD-10-CM

## 2014-07-03 DIAGNOSIS — I2584 Coronary atherosclerosis due to calcified coronary lesion: Secondary | ICD-10-CM

## 2014-07-03 LAB — GLUCOSE, CAPILLARY
GLUCOSE-CAPILLARY: 191 mg/dL — AB (ref 70–99)
GLUCOSE-CAPILLARY: 199 mg/dL — AB (ref 70–99)
GLUCOSE-CAPILLARY: 266 mg/dL — AB (ref 70–99)
Glucose-Capillary: 242 mg/dL — ABNORMAL HIGH (ref 70–99)
Glucose-Capillary: 181 mg/dL — ABNORMAL HIGH (ref 70–99)
Glucose-Capillary: 164 mg/dL — ABNORMAL HIGH (ref 70–99)
Glucose-Capillary: 230 mg/dL — ABNORMAL HIGH (ref 70–99)

## 2014-07-03 LAB — BASIC METABOLIC PANEL
Anion gap: 14 (ref 5–15)
BUN: 80 mg/dL — AB (ref 6–23)
CO2: 27 mEq/L (ref 19–32)
CREATININE: 1.91 mg/dL — AB (ref 0.50–1.10)
Calcium: 10 mg/dL (ref 8.4–10.5)
Chloride: 97 mEq/L (ref 96–112)
GFR calc Af Amer: 26 mL/min — ABNORMAL LOW (ref >90)
GFR, EST NON AFRICAN AMERICAN: 23 mL/min — AB (ref >90)
Glucose, Bld: 182 mg/dL — ABNORMAL HIGH (ref 70–99)
Potassium: 4.7 mEq/L (ref 3.7–5.3)
Sodium: 138 mEq/L (ref 137–147)

## 2014-07-03 MED ORDER — DILTIAZEM HCL ER COATED BEADS 120 MG PO CP24
120.0000 mg | ORAL_CAPSULE | Freq: Every day | ORAL | Status: DC
  Administered 2014-07-04: 120 mg via ORAL
  Filled 2014-07-03: qty 1

## 2014-07-03 MED ORDER — SODIUM CHLORIDE 0.9 % IV SOLN
INTRAVENOUS | Status: DC
  Administered 2014-07-03: 10:00:00 via INTRAVENOUS

## 2014-07-03 NOTE — Progress Notes (Signed)
Pts insulin pump is empty so it was removed. She was given 11 units novolog as per her sliding scale

## 2014-07-03 NOTE — Progress Notes (Signed)
Patient Name: ANYELY CUNNING Date of Encounter: 07/03/2014     Principal Problem:   Acute on chronic diastolic CHF (congestive heart failure) Active Problems:   Essential hypertension, benign   Chronic venous insufficiency   Diabetes mellitus type 1 with peripheral artery disease   Aortic stenosis-moderate   CAD-minimal 2012   Chronic atrial fibrillation   Atrial fibrillation with rapid ventricular response   Elevated troponin   SOB (shortness of breath)    SUBJECTIVE  Denies any CP.  SOB continues to improve.  CURRENT MEDS . acidophilus   Oral Q2200  . aspirin EC  81 mg Oral BID  . cholecalciferol  2,000 Units Oral Daily  . diltiazem  240 mg Oral Daily  . enoxaparin (LOVENOX) injection  60 mg Subcutaneous Q24H  . gabapentin  600 mg Oral QHS  . insulin aspart  0-5 Units Subcutaneous QHS  . insulin pump   Subcutaneous TID AC, HS, 0200  . irbesartan  300 mg Oral Daily  . metoprolol  100 mg Oral BID  . omega-3 acid ethyl esters  1 g Oral BID  . ondansetron (ZOFRAN) IV  4 mg Intravenous 4 times per day  . pantoprazole  40 mg Oral Daily  . pravastatin  20 mg Oral Daily  . sodium chloride  3 mL Intravenous Q12H  . sodium chloride  3 mL Intravenous Q12H    OBJECTIVE  Filed Vitals:   07/02/14 1834 07/02/14 1836 07/02/14 2057 07/03/14 0500  BP: 104/30 108/22 126/58 143/38  Pulse:  64 74 77  Temp:  97.7 F (36.5 C) 97.4 F (36.3 C) 97.5 F (36.4 C)  TempSrc:  Oral Oral Oral  Resp:  18 18 20   Height:      Weight:    134 lb 7.7 oz (61 kg)  SpO2:  92% 95% 97%    Intake/Output Summary (Last 24 hours) at 07/03/14 1130 Last data filed at 07/03/14 0945  Gross per 24 hour  Intake    650 ml  Output    525 ml  Net    125 ml   Filed Weights   07/01/14 0540 07/02/14 0600 07/03/14 0500  Weight: 134 lb 7.7 oz (61 kg) 135 lb 9.6 oz (61.508 kg) 134 lb 7.7 oz (61 kg)    PHYSICAL EXAM  General: Pleasant, NAD. Neuro: Alert and oriented X 3. Moves all extremities  spontaneously. Psych: Full affect. HEENT:  OP clear, MMM  Neck: Supple without bruits or JVD. Lungs:  Resp regular and unlabored, CTA. Heart: irr,  2/6 SEM LUSB (mid peaking) Abdomen: Soft, non-tender, non-distended, BS + x 4.  Extremities: No clubbing, cyanosis or edema. DP/PT/Radials 2+ and equal bilaterally.  Accessory Clinical Findings  CBC  Recent Labs  07/01/14 0410  WBC 13.2*  HGB 13.3  HCT 42.2  MCV 87.9  PLT 161   Basic Metabolic Panel  Recent Labs  07/02/14 0400 07/03/14 0458  NA 140 138  K 4.8 4.7  CL 97 97  CO2 31 27  GLUCOSE 151* 182*  BUN 73* 80*  CREATININE 1.87* 1.91*  CALCIUM 10.1 10.0    TELE A-fib with HR 60-70s, occasional bradycardia down to 40s    ECG  No new ekg  Echocardiogram 06/28/2014  LV EF: 65% -  70%  ------------------------------------------------------------------- Indications:   CHF - 428.0.  ------------------------------------------------------------------- History:  PMH:  Coronary artery disease. Risk factors: Former tobacco use. Hypertension. Diabetes mellitus.  ------------------------------------------------------------------- Study Conclusions  - Left ventricle: The cavity  size was normal. There was severe concentric hypertrophy. Systolic function was vigorous. The estimated ejection fraction was in the range of 65% to 70%. Wall motion was normal; there were no regional wall motion abnormalities. - Aortic valve: Cusp separation was reduced. There was mild stenosis. There was mild regurgitation. Peak velocity (S): 226 cm/s. Mean gradient (S): 11 mm Hg. Valve area (VTI): 1.36 cm^2. Valve area (Vmax): 1.37 cm^2. Valve area (Vmean): 1.4 cm^2. - Mitral valve: Severely calcified annulus. Mobility of the posterior leaflet was restricted. The findings are consistent with mild stenosis. There was mild regurgitation. Valve area by continuity equation (using LVOT flow): 1.09 cm^2. - Left  atrium: The atrium was moderately dilated. - Pulmonary arteries: Systolic pressure was mildly increased. PA peak pressure: 43 mm Hg (S).    Radiology/Studies  Dg Chest 1 View  06/27/2014   CLINICAL DATA:  SOB, Hx of HTN, diabetes. Went to urgent care in [place] for a large blister on left lower leg, doctor told her if she got SOB to go to ER  EXAM: CHEST - 1 VIEW  COMPARISON:  04/19/2014  FINDINGS: Cardiac silhouette is mildly enlarged. No mediastinal or hilar masses. There is vascular congestion and mild interstitial thickening most evident in the lung bases. Mild hazy opacity is also noted in both lung bases, likely atelectasis. No evidence of pneumonia.  Bony thorax is demineralized but grossly intact.  IMPRESSION: 1. Mild congestive heart failure.   Electronically Signed   By: Lajean Manes M.D.   On: 06/27/2014 21:02    ASSESSMENT AND PLAN  1. Acute on chronic diastolic CHF (congestive heart failure) Appears slightly dry today She received lasix 20mg  po this am. Will hold lasix until her creatinine starts to improve and then resume 20mg  daily Will try to wean off of O2 sodum restriction  2. Acute on chronic renal insufficiency Pre-renal azotemia Holding lasix as above Hopefully creatinine should start to improve soon  3. afib Rate controlled Not felt to be a candidate for anticoagulation per Dr Johnsie Cancel  4. Diabetes mellitus type 1 with peripheral artery disease  5. Essential hypertension  6. CAD-minimal 2012  Up with assistance Hopefully home tomorrow if we are able to wean off O2 and she is functional  Jalisa Sacco, Jeneen Rinks

## 2014-07-03 NOTE — Progress Notes (Signed)
[facility] PROGRESS NOTE  Emma Yu ALP:[id number] DOB: [birth year] DOA: 06/27/2014 PCP: [name], MD   Assessment/Plan: 1. Likely acutely decompensated diastolic CHF 1. Cardiology following 2. Wt remains at 61kg ->this is her dry weight 3. Net neg 5.5L since admit 4. Was on BID IV lasix initially, lasix remains on hold per below 2. Acute renal failure 1. Likely secondary to above diuresis 2. Cr cont to trend up - up to 1.9 today 3. Holding lasix 4. Given gentle fluids at 50cc for total of 250cc x 1 5. Monitor renal function 3. Afib 1. Rate controlled 2. On cardizem and beta blocker 4. HTN 1. BP stable, albeit suboptimal 2. Increased metoprolol to 100mg  bid 3. Cont cardizem 5. DM 1. Pt is on insulin gtt 2. Glucose has remained relatively stable 3. Cont for now 6. DVT prophylaixs 1. SubQ lovenox  Code Status: Full Family Communication: Pt in room  Disposition Plan: Pending  I/O last 3 completed shifts: In: 9 [P.O.:770] Out: 925 [Urine:925] Total I/O In: 120 [P.O.:120] Out: -   Weight change: -0.508 kg (-1 lb 1.9 oz)  Consultants:  Cardiology  Procedures:    Antibiotics:    HPI/Subjective: Wants to go home.  Objective: Filed Vitals:   07/02/14 1834 07/02/14 1836 07/02/14 2057 07/03/14 0500  BP: 104/30 108/22 126/58 143/38  Pulse:  64 74 77  Temp:  97.7 F (36.5 C) 97.4 F (36.3 C) 97.5 F (36.4 C)  TempSrc:  Oral Oral Oral  Resp:  18 18 20   Height:      Weight:    61 kg (134 lb 7.7 oz)  SpO2:  92% 95% 97%    Intake/Output Summary (Last 24 hours) at 07/03/14 1213 Last data filed at 07/03/14 0945  Gross per 24 hour  Intake    410 ml  Output    525 ml  Net   -115 ml   Filed Weights   07/01/14 0540 07/02/14 0600 07/03/14 0500  Weight: 61 kg (134 lb 7.7 oz) 61.508 kg (135 lb 9.6 oz) 61 kg (134 lb 7.7 oz)    Exam:   General:  Awake, in nad  Cardiovascular: regular, s1, s2  Respiratory: normal resp effort, no  wheezing  Abdomen: soft,nondistended  Musculoskeletal: perfused, no clubbing   Data Reviewed: Basic Metabolic Panel:  Recent Labs Lab 06/29/14 0110 06/30/14 0357 07/01/14 0410 07/02/14 0400 07/03/14 0458  NA 142 142 138 140 138  K 3.8 4.1 4.4 4.8 4.7  CL 99 98 95* 97 97  CO2 27 27 25 31 27   GLUCOSE 146* 186* 140* 151* 182*  BUN 31* 40* 60* 73* 80*  CREATININE 1.13* 1.23* 1.77* 1.87* 1.91*  CALCIUM 10.4 10.5 10.3 10.1 10.0   Liver Function Tests:  Recent Labs Lab 06/27/14 2005  AST 28  ALT 23  ALKPHOS 109  BILITOT 1.4*  PROT 7.9  ALBUMIN 3.6   No results for input(s): LIPASE, AMYLASE in the last 168 hours. No results for input(s): AMMONIA in the last 168 hours. CBC:  Recent Labs Lab 06/27/14 2005 07/01/14 0410  WBC 5.6 13.2*  NEUTROABS 4.1  --   HGB 10.7* 13.3  HCT 35.7* 42.2  MCV 89.0 87.9  PLT 144* 242   Cardiac Enzymes:  Recent Labs Lab 06/28/14 0617 06/28/14 1155 06/28/14 1317 06/28/14 1940 06/29/14 0110  TROPONINI <0.30 0.66* 0.52* 0.33* 0.42*   BNP (last 3 results)  Recent Labs  06/27/14 2030  PROBNP 1644.0*   CBG:  Recent  Labs Lab 07/03/14 0144 07/03/14 0334 07/03/14 0651 07/03/14 0837 07/03/14 1103  GLUCAP 242* 191* 181* 164* 199*    No results found for this or any previous visit (from the past 240 hour(s)).   Studies: No results found.  Scheduled Meds: . acidophilus   Oral Q2200  . aspirin EC  81 mg Oral BID  . cholecalciferol  2,000 Units Oral Daily  . diltiazem  240 mg Oral Daily  . enoxaparin (LOVENOX) injection  60 mg Subcutaneous Q24H  . gabapentin  600 mg Oral QHS  . insulin aspart  0-5 Units Subcutaneous QHS  . insulin pump   Subcutaneous TID AC, HS, 0200  . irbesartan  300 mg Oral Daily  . metoprolol  100 mg Oral BID  . omega-3 acid ethyl esters  1 g Oral BID  . ondansetron (ZOFRAN) IV  4 mg Intravenous 4 times per day  . pantoprazole  40 mg Oral Daily  . pravastatin  20 mg Oral Daily  . sodium  chloride  3 mL Intravenous Q12H  . sodium chloride  3 mL Intravenous Q12H   Continuous Infusions: . sodium chloride 50 mL/hr at 07/03/14 1014    Principal Problem:   Acute on chronic diastolic CHF (congestive heart failure) Active Problems:   Essential hypertension, benign   Chronic venous insufficiency   Diabetes mellitus type 1 with peripheral artery disease   Aortic stenosis-moderate   CAD-minimal 2012   Chronic atrial fibrillation   Atrial fibrillation with rapid ventricular response   Elevated troponin   SOB (shortness of breath)  Time spent: 51min  Anastacio Bua, [facility] Pager [phone]. If 7PM-7AM, please contact night-coverage at [email or web address], password [email or web address] 07/03/2014, 12:13 PM  LOS: 6 days

## 2014-07-04 DIAGNOSIS — I872 Venous insufficiency (chronic) (peripheral): Secondary | ICD-10-CM

## 2014-07-04 LAB — CBC
HCT: 39.2 % (ref 36.0–46.0)
Hemoglobin: 11.7 g/dL — ABNORMAL LOW (ref 12.0–15.0)
MCH: 27 pg (ref 26.0–34.0)
MCHC: 29.8 g/dL — ABNORMAL LOW (ref 30.0–36.0)
MCV: 90.3 fL (ref 78.0–100.0)
Platelets: 224 10*3/uL (ref 150–400)
RBC: 4.34 MIL/uL (ref 3.87–5.11)
RDW: 18.8 % — AB (ref 11.5–15.5)
WBC: 8.1 10*3/uL (ref 4.0–10.5)

## 2014-07-04 LAB — BASIC METABOLIC PANEL
ANION GAP: 10 (ref 5–15)
BUN: 72 mg/dL — ABNORMAL HIGH (ref 6–23)
CO2: 27 meq/L (ref 19–32)
Calcium: 9.9 mg/dL (ref 8.4–10.5)
Chloride: 98 mEq/L (ref 96–112)
Creatinine, Ser: 1.83 mg/dL — ABNORMAL HIGH (ref 0.50–1.10)
GFR calc non Af Amer: 24 mL/min — ABNORMAL LOW (ref >90)
GFR, EST AFRICAN AMERICAN: 28 mL/min — AB (ref >90)
Glucose, Bld: 150 mg/dL — ABNORMAL HIGH (ref 70–99)
POTASSIUM: 5.1 meq/L (ref 3.7–5.3)
Sodium: 135 mEq/L — ABNORMAL LOW (ref 137–147)

## 2014-07-04 LAB — GLUCOSE, CAPILLARY
GLUCOSE-CAPILLARY: 227 mg/dL — AB (ref 70–99)
Glucose-Capillary: 132 mg/dL — ABNORMAL HIGH (ref 70–99)
Glucose-Capillary: 146 mg/dL — ABNORMAL HIGH (ref 70–99)

## 2014-07-04 MED ORDER — FUROSEMIDE 20 MG PO TABS: 20.0000 mg | ORAL_TABLET | Freq: Every day | ORAL | Status: DC

## 2014-07-04 MED ORDER — METOPROLOL TARTRATE 100 MG PO TABS: 100.0000 mg | ORAL_TABLET | Freq: Two times a day (BID) | ORAL | Status: DC

## 2014-07-04 NOTE — Discharge Summary (Signed)
Physician Discharge Summary  Emma Yu IDP:[id number] DOB: 1926-11-25 DOA: 06/27/2014  PCP: Emma Calico, MD  Admit date: 06/27/2014 Discharge date: 07/04/2014  Time spent: 35 minutes  Recommendations for Outpatient Follow-up:  1. Follow up with PCP in 1-2 weeks 2. Follow up with Cardiology as scheduled 3. Weight on discharge 61kg  Discharge Diagnoses:  Principal Problem:   Acute on chronic diastolic CHF (congestive heart failure) Active Problems:   Essential hypertension, benign   Chronic venous insufficiency   Diabetes mellitus type 1 with peripheral artery disease   Aortic stenosis-moderate   CAD-minimal 2012   Chronic atrial fibrillation   Atrial fibrillation with rapid ventricular response   Elevated troponin   SOB (shortness of breath)   Discharge Condition: Improved  Diet recommendation: Heart healthy/diabetic  Filed Weights   07/02/14 0600 07/03/14 0500 07/04/14 0540  Weight: 61.508 kg (135 lb 9.6 oz) 61 kg (134 lb 7.7 oz) 61.3 kg (135 lb 2.3 oz)    History of present illness:  Please see admit h and p from 11/8 for details. Briefly, pt presents with sob and found to be in acute heart failure. The patient was admitted for further work up.  Hospital Course:  1. Likely acutely decompensated diastolic CHF 1. Cardiology was consulted and had been following 2. Wt was brought down to and remained at 61kg ->this is her dry weight 3. Net neg 4.9L since admit 4. Was on BID IV lasix initially, lasix was placed on hold per below 5. As renal function started to improve, Cardiology recs to resume 20mg  po lasix two days after discharge (resume 11/17) 2. Acute renal failure 1. Likely secondary to above diuresis 2. Cr cont to trend up - up to peak Cr of 1.9, trending down by day of discharge 3. Holding lasix with cardiology recs to resume 20mg  daily on 11/17 4. Pt was given gentle fluids at 50cc for total of 250cc x 1 on 11/14 5. Monitor renal function  closely 3. Afib 1. Rate controlled 2. On cardizem and beta blocker 4. HTN 1. BP stable, albeit suboptimal 2. Increased metoprolol to 100mg  bid 3. Pt noted to be intermittently bradycardic 4. Cardizem was d/c'd 5. DM 1. Pt is on insulin pump 6. DVT prophylaixs 1. SubQ lovenox  Consultations:  Cardiology  Discharge Exam: Filed Vitals:   07/03/14 1901 07/03/14 2026 07/03/14 2345 07/04/14 0540  BP: 119/42 149/37 156/45 151/41  Pulse:  80 67 64  Temp:  98.6 F (37 C)  98 F (36.7 C)  TempSrc:  Oral  Oral  Resp:  19  18  Height:      Weight:    61.3 kg (135 lb 2.3 oz)  SpO2:  98%  95%    General: Awake, in nad Cardiovascular: regular, s1, s2 Respiratory: normal resp effort, no wheezing  Discharge Instructions     Medication List    STOP taking these medications        ciprofloxacin 250 MG tablet  Commonly known as:  CIPRO     cloNIDine 0.2 MG tablet  Commonly known as:  CATAPRES      TAKE these medications        aspirin EC 81 MG tablet  Take 81 mg by mouth 2 (two) times daily.     b complex vitamins tablet  Take 1 tablet by mouth daily.     fish oil-omega-3 fatty acids 1000 MG capsule  Take 2 g by mouth 2 (two) times daily.  furosemide 20 MG tablet  Commonly known as:  LASIX  Take 1 tablet (20 mg total) by mouth daily.  Start taking on:  07/06/2014     gabapentin 300 MG capsule  Commonly known as:  NEURONTIN  Take 600 mg by mouth at bedtime.     insulin lispro 100 UNIT/ML injection  Commonly known as:  HUMALOG  For use in pump, total of 70 units per day     Insulin Pump Syringe Reservoir Misc  1 Device by Does not apply route every 3 (three) days. Animas 2 ml     INSET INFUSION SET 23" 6MM Misc  1 Device by Does not apply route every 3 (three) days.     metoprolol 100 MG tablet  Commonly known as:  LOPRESSOR  Take 1 tablet (100 mg total) by mouth 2 (two) times daily.     omeprazole 40 MG capsule  Commonly known as:  PRILOSEC  Take  40 mg by mouth daily before breakfast.     pravastatin 20 MG tablet  Commonly known as:  PRAVACHOL  TAKE 1 TABLET BY MOUTH IN THE EVENING     PROBIOTIC PO  Take 1 tablet by mouth daily at 10 pm.     traMADol 50 MG tablet  Commonly known as:  ULTRAM  TAKE 2 TABLETS BY MOUTH EVERY 6 HOURS AS NEEDED FOR PAIN     valsartan 320 MG tablet  Commonly known as:  DIOVAN  Take 320 mg by mouth daily.     vitamin C 500 MG tablet  Commonly known as:  ASCORBIC ACID  Take 500 mg by mouth daily.     Vitamin D3 2000 UNITS capsule  Take 2,000 Units by mouth daily.       Allergies  Allergen Reactions  . Carvedilol Other (See Comments)    Heart stops  . Clindamycin/Lincomycin Other (See Comments)    tremors  . Diltiazem Hcl Other (See Comments)    Chest pain;   07/02/14-  Patient has been able to tolerate diltiazem PO as inpatient since 06/29/14 without any adverse reaction.  . Fenofibrate Other (See Comments)    Unknown - [facility] - MAR  . Hctz [Hydrochlorothiazide] Other (See Comments)    Chest pain  . Nisoldipine Other (See Comments)    Chest pain  . Nitroglycerin Other (See Comments)    Unknown - [facility] MAR  . Propranolol Diarrhea    Chest pain  . Septra [Sulfamethoxazole-Trimethoprim]     Rash, cyst  . Patrici Ranks Fumarate] Other (See Comments)    Unknown reaction  . Valturna [Aliskiren-Valsartan] Other (See Comments)    Unknown reaction   Follow-up Information    Follow up with [facility].   Why:  Registered Nurse, Physical Therapy Services to start within 24-48 hours of discharge   Contact information:   [address] [place] [address] [phone]        The results of significant diagnostics from this hospitalization (including imaging, microbiology, ancillary and laboratory) are listed below for reference.    Significant Diagnostic Studies: Dg Chest 1 View  06/27/2014   CLINICAL DATA:  SOB, Hx of HTN, diabetes. Went to urgent care in  [place] for a large blister on left lower leg, doctor told her if she got SOB to go to ER  EXAM: CHEST - 1 VIEW  COMPARISON:  04/19/2014  FINDINGS: Cardiac silhouette is mildly enlarged. No mediastinal or hilar masses. There is vascular congestion and mild  interstitial thickening most evident in the lung bases. Mild hazy opacity is also noted in both lung bases, likely atelectasis. No evidence of pneumonia.  Bony thorax is demineralized but grossly intact.  IMPRESSION: 1. Mild congestive heart failure.   Electronically Signed   By: Lajean Manes M.D.   On: 06/27/2014 21:02    Microbiology: No results found for this or any previous visit (from the past 240 hour(s)).   Labs: Basic Metabolic Panel:  Recent Labs Lab 06/30/14 0357 07/01/14 0410 07/02/14 0400 07/03/14 0458 07/04/14 0458  NA 142 138 140 138 135*  K 4.1 4.4 4.8 4.7 5.1  CL 98 95* 97 97 98  CO2 27 25 31 27 27   GLUCOSE 186* 140* 151* 182* 150*  BUN 40* 60* 73* 80* 72*  CREATININE 1.23* 1.77* 1.87* 1.91* 1.83*  CALCIUM 10.5 10.3 10.1 10.0 9.9   Liver Function Tests:  Recent Labs Lab 06/27/14 2005  AST 28  ALT 23  ALKPHOS 109  BILITOT 1.4*  PROT 7.9  ALBUMIN 3.6   No results for input(s): LIPASE, AMYLASE in the last 168 hours. No results for input(s): AMMONIA in the last 168 hours. CBC:  Recent Labs Lab 06/27/14 2005 07/01/14 0410 07/04/14 0458  WBC 5.6 13.2* 8.1  NEUTROABS 4.1  --   --   HGB 10.7* 13.3 11.7*  HCT 35.7* 42.2 39.2  MCV 89.0 87.9 90.3  PLT 144* 242 224   Cardiac Enzymes:  Recent Labs Lab 06/28/14 0617 06/28/14 1155 06/28/14 1317 06/28/14 1940 06/29/14 0110  TROPONINI <0.30 0.66* 0.52* 0.33* 0.42*   BNP: BNP (last 3 results)  Recent Labs  06/27/14 2030  PROBNP 1644.0*   CBG:  Recent Labs Lab 07/03/14 1709 07/03/14 2059 07/04/14 0224 07/04/14 0608 07/04/14 0718  GLUCAP 230* 266* 227* 132* 146*   Signed:  Yarenis Cerino K  [facility] 07/04/2014, 11:03  AM

## 2014-07-04 NOTE — Progress Notes (Signed)
Pt d/c'd home with son. D/c instructions given to and d/w pt and son

## 2014-07-04 NOTE — Progress Notes (Signed)
Patient Name: Emma Yu Date of Encounter: 07/04/2014     Principal Problem:   Acute on chronic diastolic CHF (congestive heart failure) Active Problems:   Essential hypertension, benign   Chronic venous insufficiency   Diabetes mellitus type 1 with peripheral artery disease   Aortic stenosis-moderate   CAD-minimal 2012   Chronic atrial fibrillation   Atrial fibrillation with rapid ventricular response   Elevated troponin   SOB (shortness of breath)    SUBJECTIVE  Denies any CP.  SOB is much improved.  [name] off of O2 now.  Ambulating slowly  CURRENT MEDS . acidophilus   Oral Q2200  . aspirin EC  81 mg Oral BID  . cholecalciferol  2,000 Units Oral Daily  . enoxaparin (LOVENOX) injection  60 mg Subcutaneous Q24H  . gabapentin  600 mg Oral QHS  . insulin aspart  0-5 Units Subcutaneous QHS  . insulin pump   Subcutaneous TID AC, HS, 0200  . irbesartan  300 mg Oral Daily  . metoprolol  100 mg Oral BID  . omega-3 acid ethyl esters  1 g Oral BID  . ondansetron (ZOFRAN) IV  4 mg Intravenous 4 times per day  . pantoprazole  40 mg Oral Daily  . pravastatin  20 mg Oral Daily  . sodium chloride  3 mL Intravenous Q12H  . sodium chloride  3 mL Intravenous Q12H    OBJECTIVE  Filed Vitals:   07/03/14 1901 07/03/14 2026 07/03/14 2345 07/04/14 0540  BP: 119/42 149/37 156/45 151/41  Pulse:  80 67 64  Temp:  98.6 F (37 C)  98 F (36.7 C)  TempSrc:  Oral  Oral  Resp:  19  18  Height:      Weight:    135 lb 2.3 oz (61.3 kg)  SpO2:  98%  95%    Intake/Output Summary (Last 24 hours) at 07/04/14 0956 Last data filed at 07/04/14 0539  Gross per 24 hour  Intake    980 ml  Output    400 ml  Net    580 ml   Filed Weights   07/02/14 0600 07/03/14 0500 07/04/14 0540  Weight: 135 lb 9.6 oz (61.508 kg) 134 lb 7.7 oz (61 kg) 135 lb 2.3 oz (61.3 kg)    PHYSICAL EXAM  General: Pleasant, NAD. Neuro: Alert and oriented X 3. Moves all extremities spontaneously. Psych:  Full affect. HEENT:  OP clear, MMM  Neck: Supple without bruits or JVD. Lungs:  Resp regular and unlabored, CTA. Heart: irr,  2/6 SEM LUSB (mid peaking) Abdomen: Soft, non-tender, non-distended, BS + x 4.  Extremities: No clubbing, cyanosis or edema. DP/PT/Radials 2+ and equal bilaterally.  Accessory Clinical Findings  CBC  Recent Labs  07/04/14 0458  WBC 8.1  HGB 11.7*  HCT 39.2  MCV 90.3  PLT 778   Basic Metabolic Panel  Recent Labs  07/03/14 0458 07/04/14 0458  NA 138 135*  K 4.7 5.1  CL 97 98  CO2 27 27  GLUCOSE 182* 150*  BUN 80* 72*  CREATININE 1.91* 1.83*  CALCIUM 10.0 9.9    TELE A-fib with HR 60s, occasional bradycardia down to 40s     Radiology/Studies  Dg Chest 1 View  06/27/2014   CLINICAL DATA:  SOB, Hx of HTN, diabetes. Went to urgent care in [place] for a large blister on left lower leg, doctor told her if she got SOB to go to ER  EXAM: CHEST - 1 VIEW  COMPARISON:  04/19/2014  FINDINGS: Cardiac silhouette is mildly enlarged. No mediastinal or hilar masses. There is vascular congestion and mild interstitial thickening most evident in the lung bases. Mild hazy opacity is also noted in both lung bases, likely atelectasis. No evidence of pneumonia.  Bony thorax is demineralized but grossly intact.  IMPRESSION: 1. Mild congestive heart failure.   Electronically Signed   By: Lajean Manes M.D.   On: 06/27/2014 21:02    ASSESSMENT AND PLAN  1. Acute on chronic diastolic CHF (congestive heart failure) Appears about euvolemic sodum restriction Restart lasix 20mg  daily on Tuesday.  2. Acute on chronic renal insufficiency Pre-renal azotemia Slowly improving Lasix on hold as above  3. afib Rate controlled Not felt to be a candidate for anticoagulation per Dr Johnsie Cancel Given bradycardia, I will stop diltiazem  4. Diabetes mellitus type 1 with peripheral artery disease  5. Essential hypertension Stable No change required today  6. CAD-minimal  2012  Up with assistance OK to discharge from a CV standpoint  Will need transition of care appointment with APP or primary cardilogist.  My office will arrange. Call with questions.  Thompson Grayer

## 2014-07-04 NOTE — Discharge Instructions (Signed)
°  Please resume Lasix at 20mg  daily starting on Tuesday (07/06/14)

## 2014-07-05 ENCOUNTER — Telehealth: Payer: Self-pay | Admitting: *Deleted

## 2014-07-05 LAB — GLUCOSE, CAPILLARY: Glucose-Capillary: 218 mg/dL — ABNORMAL HIGH (ref 70–99)

## 2014-07-05 NOTE — Telephone Encounter (Signed)
Transition Care Management Follow-up Telephone Call D/C 07/04/14  How have you been since you were released from the hospital? Pt states that she is still shaky other than that she feel ok   Do you understand why you were in the hospital? YES, she stated she understood why she was admitted   Do you understand the discharge instructions? YES, she understood d/c summary  Items Reviewed:  Medications reviewed: YES, reviewed  Allergies reviewed: YES, reviewed  Dietary changes reviewed: YES, heart healthy  Referrals reviewed: advance has not contact pt inform her per d/c they will be contacting her regarding PT   Functional Questionnaire:   Activities of Daily Living (ADLs):   She states she are independent in the following: ambulation, bathing and hygiene, feeding and dressing She states she doesn't require assistance   Any transportation issues/concerns?:  NO   Any patient concerns? YES, pt stated she is concern about her (L) leg, has some swelling & also she is have some memory issues.    Confirmed importance and date/time of follow-up visits scheduled: YES, pt had already has appt on 07/07/14 with Dr. Ronnald Ramp, advise pt to keep appt   Confirmed with patient if condition begins to worsen call PCP or go to the ER.  Patient was given the Call-a-Nurse line [phone]: YES

## 2014-07-05 NOTE — Telephone Encounter (Signed)
Received phone call from [place] with [facility] requesting orders for pt who was discharged from [place] orders for Occupational Therapy and [place] to help with daily care. Discussed with Dr. Ronnald Ramp. Verbal order given for Occupational Therapy and Home Health Aide okay per  Dr. Ronnald Ramp. BJ verbalized understanding.

## 2014-07-07 ENCOUNTER — Ambulatory Visit (INDEPENDENT_AMBULATORY_CARE_PROVIDER_SITE_OTHER): Payer: Medicare Other | Admitting: Internal Medicine

## 2014-07-07 ENCOUNTER — Other Ambulatory Visit (INDEPENDENT_AMBULATORY_CARE_PROVIDER_SITE_OTHER): Payer: Medicare Other

## 2014-07-07 ENCOUNTER — Encounter: Payer: Self-pay | Admitting: Internal Medicine

## 2014-07-07 VITALS — BP 120/80 | HR 70 | Temp 98.3°F | Ht 65.0 in | Wt 143.0 lb

## 2014-07-07 DIAGNOSIS — N183 Chronic kidney disease, stage 3 unspecified: Secondary | ICD-10-CM

## 2014-07-07 DIAGNOSIS — N189 Chronic kidney disease, unspecified: Secondary | ICD-10-CM

## 2014-07-07 DIAGNOSIS — L97921 Non-pressure chronic ulcer of unspecified part of left lower leg limited to breakdown of skin: Secondary | ICD-10-CM

## 2014-07-07 DIAGNOSIS — I050 Rheumatic mitral stenosis: Secondary | ICD-10-CM

## 2014-07-07 DIAGNOSIS — I4891 Unspecified atrial fibrillation: Secondary | ICD-10-CM

## 2014-07-07 DIAGNOSIS — L97929 Non-pressure chronic ulcer of unspecified part of left lower leg with unspecified severity: Secondary | ICD-10-CM | POA: Insufficient documentation

## 2014-07-07 DIAGNOSIS — I5033 Acute on chronic diastolic (congestive) heart failure: Secondary | ICD-10-CM

## 2014-07-07 LAB — BASIC METABOLIC PANEL
BUN: 62 mg/dL — AB (ref 6–23)
CO2: 29 meq/L (ref 19–32)
Calcium: 10.6 mg/dL — ABNORMAL HIGH (ref 8.4–10.5)
Chloride: 104 mEq/L (ref 96–112)
Creatinine, Ser: 1.5 mg/dL — ABNORMAL HIGH (ref 0.4–1.2)
GFR: 34.02 mL/min — ABNORMAL LOW (ref >60.00)
GLUCOSE: 240 mg/dL — AB (ref 70–99)
POTASSIUM: 5.8 meq/L — AB (ref 3.5–5.1)
SODIUM: 142 meq/L (ref 135–145)

## 2014-07-07 NOTE — Progress Notes (Signed)
Subjective:    Patient ID: Emma Yu, female    DOB: 07-Nov-1926, 80 y.o.   MRN: [id number]  Congestive Heart Failure Presents for follow-up visit. Associated symptoms include fatigue, muscle weakness and shortness of breath. Pertinent negatives include no abdominal pain, chest pain, chest pressure, claudication, edema, near-syncope, nocturia, orthopnea, palpitations, paroxysmal nocturnal dyspnea or unexpected weight change. The symptoms have been improving. Past treatments include salt and fluid restriction. The treatment provided significant relief.      Review of Systems  Constitutional: Positive for fatigue. Negative for chills, diaphoresis, appetite change and unexpected weight change.  HENT: Negative.   Eyes: Negative.   Respiratory: Positive for shortness of breath. Negative for apnea, cough, choking, chest tightness, wheezing and stridor.   Cardiovascular: Negative.  Negative for chest pain, palpitations, claudication, leg swelling and near-syncope.  Gastrointestinal: Negative.  Negative for nausea, vomiting, abdominal pain, diarrhea, constipation and blood in stool.  Endocrine: Negative.   Genitourinary: Negative.  Negative for frequency, hematuria, flank pain, difficulty urinating and nocturia.  Musculoskeletal: Positive for muscle weakness.  Skin: Positive for wound. Negative for color change, pallor and rash.       She has had an ulcer on her LLE that is slowly healing, she wants a new dressing today  Allergic/Immunologic: Negative.   Neurological: Negative.   Hematological: Negative.  Negative for adenopathy. Does not bruise/bleed easily.  Psychiatric/Behavioral: Negative.        Objective:   Physical Exam  Constitutional: [name] oriented to person, place, and time. She appears well-developed and well-nourished. No distress.  HENT:  Head: Normocephalic and atraumatic.  Mouth/Throat: Oropharynx is clear and moist. No oropharyngeal exudate.  Eyes: Conjunctivae  are normal. Right eye exhibits no discharge. Left eye exhibits no discharge. No scleral icterus.  Neck: Normal range of motion. Neck supple. No JVD present. No tracheal deviation present. No thyromegaly present.  Cardiovascular: Normal rate.  An irregularly irregular rhythm present. Exam reveals no gallop, no distant heart sounds and no friction rub.   Murmur heard.  Decrescendo systolic murmur is present with a grade of 1/6   No diastolic murmur is present  Pulmonary/Chest: Effort normal and breath sounds normal. No stridor. No respiratory distress. She has no wheezes. She has no rales. She exhibits no tenderness.  Abdominal: Soft. Bowel sounds are normal. She exhibits no distension and no mass. There is no tenderness. There is no rebound and no guarding.  Musculoskeletal: Normal range of motion. She exhibits no edema or tenderness.       Legs: Lymphadenopathy:    She has no cervical adenopathy.  Neurological: [name] oriented to person, place, and time.  Skin: Skin is warm and dry. [name] not diaphoretic.  Psychiatric: She has a normal mood and affect. Her behavior is normal. Judgment and thought content normal.      Lab Results  Component Value Date   WBC 8.1 07/04/2014   HGB 11.7* 07/04/2014   HCT 39.2 07/04/2014   PLT 224 07/04/2014   GLUCOSE 150* 07/04/2014   CHOL 109 12/21/2013   TRIG 146.0 12/21/2013   HDL 50.40 12/21/2013   LDLCALC 29 12/21/2013   ALT 23 06/27/2014   AST 28 06/27/2014   NA 135* 07/04/2014   K 5.1 07/04/2014   CL 98 07/04/2014   CREATININE 1.83* 07/04/2014   BUN 72* 07/04/2014   CO2 27 07/04/2014   TSH 0.805 06/28/2014   INR 1.18 12/25/2011   HGBA1C 6.7* 06/28/2014   MICROALBUR 2.2*  07/20/2013      Assessment & Plan:

## 2014-07-07 NOTE — Patient Instructions (Signed)

## 2014-07-07 NOTE — Progress Notes (Signed)
Pre visit review using our clinic review tool, if applicable. No additional management support is needed unless otherwise documented below in the visit note. 

## 2014-07-09 ENCOUNTER — Ambulatory Visit (HOSPITAL_COMMUNITY)
Admit: 2014-07-09 | Discharge: 2014-07-09 | Disposition: A | Discharge status: Home / Self Care | Payer: Medicare Other | Source: Ambulatory Visit | Attending: Cardiology | Admitting: Cardiology

## 2014-07-09 VITALS — BP 142/68 | HR 87 | Wt 123.8 lb

## 2014-07-09 DIAGNOSIS — N189 Chronic kidney disease, unspecified: Secondary | ICD-10-CM | POA: Insufficient documentation

## 2014-07-09 DIAGNOSIS — I350 Nonrheumatic aortic (valve) stenosis: Secondary | ICD-10-CM | POA: Diagnosis not present

## 2014-07-09 DIAGNOSIS — I10 Essential (primary) hypertension: Secondary | ICD-10-CM | POA: Diagnosis not present

## 2014-07-09 DIAGNOSIS — N183 Chronic kidney disease, stage 3 unspecified: Secondary | ICD-10-CM

## 2014-07-09 DIAGNOSIS — I481 Persistent atrial fibrillation: Secondary | ICD-10-CM

## 2014-07-09 DIAGNOSIS — I5032 Chronic diastolic (congestive) heart failure: Secondary | ICD-10-CM | POA: Insufficient documentation

## 2014-07-09 DIAGNOSIS — I4819 Other persistent atrial fibrillation: Secondary | ICD-10-CM

## 2014-07-09 DIAGNOSIS — I482 Chronic atrial fibrillation, unspecified: Secondary | ICD-10-CM

## 2014-07-09 DIAGNOSIS — I5033 Acute on chronic diastolic (congestive) heart failure: Secondary | ICD-10-CM

## 2014-07-09 LAB — BASIC METABOLIC PANEL
Anion gap: 12 (ref 5–15)
BUN: 50 mg/dL — AB (ref 6–23)
CALCIUM: 10.8 mg/dL — AB (ref 8.4–10.5)
CO2: 28 meq/L (ref 19–32)
CREATININE: 1.2 mg/dL — AB (ref 0.50–1.10)
Chloride: 101 mEq/L (ref 96–112)
GFR calc Af Amer: 46 mL/min — ABNORMAL LOW (ref >90)
GFR calc non Af Amer: 40 mL/min — ABNORMAL LOW (ref >90)
Glucose, Bld: 150 mg/dL — ABNORMAL HIGH (ref 70–99)
Potassium: 5.3 mEq/L (ref 3.7–5.3)
Sodium: 141 mEq/L (ref 137–147)

## 2014-07-09 LAB — PRO B NATRIURETIC PEPTIDE: Pro B Natriuretic peptide (BNP): 1510 pg/mL — ABNORMAL HIGH (ref 0–450)

## 2014-07-09 MED ORDER — FUROSEMIDE 20 MG PO TABS: 40.0000 mg | ORAL_TABLET | Freq: Every day | ORAL | Status: DC

## 2014-07-09 NOTE — Assessment & Plan Note (Signed)
She has a normal volume status today Will cont the same regimen for now

## 2014-07-09 NOTE — Patient Instructions (Signed)
Increase Furosemide to 40 mg (2 tabs) daily  Labs today and again in 10 days  Your physician recommends that you schedule a follow-up appointment in: 1 month

## 2014-07-09 NOTE — Assessment & Plan Note (Signed)
She has good rate control today

## 2014-07-09 NOTE — Assessment & Plan Note (Signed)
Improvement noted An UNNA boot was placed  She was asked to RTC in 5-7 days to remove the [facility] boot and to recheck the area

## 2014-07-10 NOTE — Progress Notes (Signed)
Patient ID: Emma Yu, female   DOB: 10/03/1926, 80 y.o.   MRN: [id number] PCP: Dr. Ronnald Ramp  80 yo with history of diastolic CHF, PAD, CKD, mild aortic stenosis, and persistent atrial fibrillation presents for CHF clinic evaluation.  [name] seen here for the first time today and I have reviewed her old records.  Patient was recently admitted to [facility] with acute on chronic diastolic CHF.  She had quit taking Lasix for about 2 wks prior to admission because she did not like having to go to the bathroom a lot.  In the hospital, she developed AKI. [name] now home with her son.  [name] taking Lasix 20 mg daily.  In the past, she took Lasix 40 mg daily.    She uses a walker to get around.  [name] not anticoagulated despite atrial fibrillation due to fall risk.  [name] now able to walk around the house without significant dyspnea.  She sleeps in a recliner but has been doing this a long time.  No PND.  No chest pain.  No claudication. Her left leg is swollen more than her right leg, but lower extremity US in 10/15 showed no DVT. Last echo in 11/15 showed mild AS, mild AI, mild MS, and EF 60-65%.    ECG: Atrial fibrillation  Labs (11/15): K 5.8, creatinine 1.5  PMH: 1. Diastolic CHF 2. PAD: Bilateral CIA stents in 2002.  She has known significant right SFA disease.  Followed by Dr. Fletcher [name].   3. CKD 4. HTN 5. Hyperlipidemia 6. Type II diabetes 7. LHC in 5/12 showed luminal irregularities.  8. Right THR.  9. Atrial fibrillation: Persistent.  Not an anticoagulation candidate due to fall risk.  10. Carotid stenosis 11. Aortic stenosis: Echo (11/15) with EF 65-70%, severe LVH, mild AS with mean gradient 11 mmHg, mild AI, mild MS, PA systolic pressure 43 mmHg.   SH: Nonsmoker.  Lives with son in [place].  FH: CAD  ROS: All systems reviewed and negative except as per HPI.   Current Outpatient Prescriptions  Medication Sig Dispense Refill  . Ascorbic Acid (VITAMIN C) 500 MG tablet Take  500 mg by mouth daily.      Marland Kitchen aspirin EC 81 MG tablet Take 81 mg by mouth 2 (two) times daily.    Marland Kitchen b complex vitamins tablet Take 1 tablet by mouth daily.      . Cholecalciferol (VITAMIN D3) 2000 UNITS capsule Take 2,000 Units by mouth daily.      . fish oil-omega-3 fatty acids 1000 MG capsule Take 2 g by mouth 2 (two) times daily.     . furosemide (LASIX) 20 MG tablet Take 2 tablets (40 mg total) by mouth daily. 60 tablet 3  . gabapentin (NEURONTIN) 300 MG capsule Take 600 mg by mouth at bedtime.    . Insulin Infusion Pump Supplies (INSET INFUSION SET 23" 6MM) MISC 1 Device by Does not apply route every 3 (three) days.    . Insulin Infusion Pump Supplies (INSULIN PUMP SYRINGE RESERVOIR) MISC 1 Device by Does not apply route every 3 (three) days. Animas 2 ml    . insulin lispro (HUMALOG) 100 UNIT/ML injection For use in pump, total of 70 units per day 7 vial 0  . metoprolol (LOPRESSOR) 100 MG tablet Take 1 tablet (100 mg total) by mouth 2 (two) times daily. 30 tablet 0  . omeprazole (PRILOSEC) 40 MG capsule Take 40 mg by mouth daily before breakfast.     .  pravastatin (PRAVACHOL) 20 MG tablet TAKE 1 TABLET BY MOUTH IN THE EVENING (Patient taking differently: Take 20 mg by mouth daily. TAKE 1 TABLET BY MOUTH IN THE EVENING) 30 tablet 11  . Probiotic Product (PROBIOTIC PO) Take 1 tablet by mouth daily at 10 pm.    . traMADol (ULTRAM) 50 MG tablet TAKE 2 TABLETS BY MOUTH EVERY 6 HOURS AS NEEDED FOR PAIN 120 tablet 3  . valsartan (DIOVAN) 320 MG tablet Take 320 mg by mouth daily.     No current facility-administered medications for this encounter.    BP 142/68 mmHg  Pulse 87  Wt 123 lb 12.8 oz (56.155 kg)  SpO2 93% General: NAD Neck: JVP 8-9 cm, no thyromegaly or thyroid nodule.  Lungs: Clear to auscultation bilaterally with normal respiratory effort. CV: Nondisplaced PMI.  Heart regular S1/S2, no S3/S4, 2/6 SEM RUSB with clear S2.  1+ edema L>R.  No carotid bruit.  Normal pedal pulses.   Abdomen: Soft, nontender, no hepatosplenomegaly, no distention.  Skin: Intact without lesions or rashes.  Neurologic: Alert and oriented x 3.  Psych: Normal affect. Extremities: No clubbing or cyanosis.  HEENT: Normal.   Assessment/Plan:  1. Chronic diastolic CHF: Probably NYHA class III symptoms.  Patient still appears volume overloaded but says that her breathing has improved.  Diuresis will be limited by CKD.   - I will have her increase Lasix to 40 mg daily (she had been stable on this dose in the past).  She needs to take her Lasix every day.   - BMET/BNP today.  - Follow low sodium diet (has not been watching in the past).   2. CKD: Creatinine 1.5 at discharge earlier this month.  Will check BMET today. If creatinine rising, will need to consider cutting back on valsartan and using a different agent for BP control.  3. Atrial fibrilation: Chronic, reasonable rate control.  [name] not on anticoagulation due to fall risk.  4. HTN: Mildly elevated BP.  Will continue current meds.  5. Aortic stenosis: Mild on last echo.   [name] 07/10/2014

## 2014-08-02 DIAGNOSIS — L97921 Non-pressure chronic ulcer of unspecified part of left lower leg limited to breakdown of skin: Secondary | ICD-10-CM | POA: Diagnosis not present

## 2014-08-02 DIAGNOSIS — N189 Chronic kidney disease, unspecified: Secondary | ICD-10-CM

## 2014-08-02 DIAGNOSIS — I4891 Unspecified atrial fibrillation: Secondary | ICD-10-CM | POA: Diagnosis not present

## 2014-08-02 DIAGNOSIS — I050 Rheumatic mitral stenosis: Secondary | ICD-10-CM

## 2014-08-02 DIAGNOSIS — I5033 Acute on chronic diastolic (congestive) heart failure: Secondary | ICD-10-CM | POA: Diagnosis not present

## 2014-08-02 DIAGNOSIS — I87312 Chronic venous hypertension (idiopathic) with ulcer of left lower extremity: Secondary | ICD-10-CM | POA: Diagnosis not present

## 2014-08-02 DIAGNOSIS — L97829 Non-pressure chronic ulcer of other part of left lower leg with unspecified severity: Secondary | ICD-10-CM | POA: Diagnosis not present

## 2014-08-03 ENCOUNTER — Ambulatory Visit (HOSPITAL_COMMUNITY)
Admission: RE | Admit: 2014-08-03 | Discharge: 2014-08-03 | Disposition: A | Discharge status: Home / Self Care | Payer: Medicare Other | Source: Ambulatory Visit | Attending: Internal Medicine | Admitting: Internal Medicine

## 2014-08-03 VITALS — BP 120/54 | HR 81 | Wt 147.0 lb

## 2014-08-03 DIAGNOSIS — E119 Type 2 diabetes mellitus without complications: Secondary | ICD-10-CM | POA: Diagnosis not present

## 2014-08-03 DIAGNOSIS — I5032 Chronic diastolic (congestive) heart failure: Secondary | ICD-10-CM | POA: Insufficient documentation

## 2014-08-03 DIAGNOSIS — I482 Chronic atrial fibrillation, unspecified: Secondary | ICD-10-CM

## 2014-08-03 DIAGNOSIS — Z794 Long term (current) use of insulin: Secondary | ICD-10-CM | POA: Diagnosis not present

## 2014-08-03 DIAGNOSIS — Z7982 Long term (current) use of aspirin: Secondary | ICD-10-CM | POA: Diagnosis not present

## 2014-08-03 DIAGNOSIS — I5033 Acute on chronic diastolic (congestive) heart failure: Secondary | ICD-10-CM

## 2014-08-03 DIAGNOSIS — N189 Chronic kidney disease, unspecified: Secondary | ICD-10-CM | POA: Insufficient documentation

## 2014-08-03 DIAGNOSIS — I129 Hypertensive chronic kidney disease with stage 1 through stage 4 chronic kidney disease, or unspecified chronic kidney disease: Secondary | ICD-10-CM | POA: Diagnosis not present

## 2014-08-03 DIAGNOSIS — N183 Chronic kidney disease, stage 3 unspecified: Secondary | ICD-10-CM

## 2014-08-03 DIAGNOSIS — I350 Nonrheumatic aortic (valve) stenosis: Secondary | ICD-10-CM | POA: Diagnosis not present

## 2014-08-03 DIAGNOSIS — I10 Essential (primary) hypertension: Secondary | ICD-10-CM

## 2014-08-03 DIAGNOSIS — E785 Hyperlipidemia, unspecified: Secondary | ICD-10-CM | POA: Insufficient documentation

## 2014-08-03 MED ORDER — PRAVASTATIN SODIUM 20 MG PO TABS: 20.0000 mg | ORAL_TABLET | Freq: Every day | ORAL | Status: DC

## 2014-08-03 MED ORDER — METOPROLOL TARTRATE 100 MG PO TABS: 100.0000 mg | ORAL_TABLET | Freq: Two times a day (BID) | ORAL | Status: DC

## 2014-08-03 NOTE — Patient Instructions (Signed)
Take Furosemide (Lasix) 60 mg (1 & 1/2 tab) for 2 days then back to 40 mg daily  You have been referred to [facility], they will contact you before coming out  You have been referred to [facility], they will contact you before coming out  Labs in 1 week, we will have [place] draw it for [place]  Your physician recommends that you schedule a follow-up appointment in: 6 weeks

## 2014-08-03 NOTE — Progress Notes (Signed)
Patient ID: BLEN RANSOME, female   DOB: Aug 15, 1927, 80 y.o.   MRN: [id number] PCP: Dr. Ronnald Ramp  80 yo with history of diastolic CHF, PAD, CKD, mild aortic stenosis, and persistent atrial fibrillation presents for CHF clinic evaluation.  [name] seen here for the first time today and I have reviewed her old records.  Patient was recently admitted to [facility] with acute on chronic diastolic CHF.  She had quit taking Lasix for about 2 wks prior to admission because she did not like having to go to the bathroom a lot.  In the hospital, she developed AKI. [name] now home with her son.  [name] taking Lasix 20 mg daily.  In the past, she took Lasix 40 mg daily.    She returns for follow up. Last visit lasix was increased to 40 mg daily. Weight at home 139-145 pounds. Denies SOB/PND/Orthopnea.Sleeps in a recliner for comfort.  Limiting fluid intake to < 2 liters per day. Eating some take out foods. Ambulates with a walker. Chronic lower extremity edema. Taking medications.   Lower extremity US in 10/15 showed no DVT.  Last echo in 11/15 showed mild AS, mild AI, mild MS, and EF 60-65%.    Labs (11/15): K 5.8, creatinine 1.5 Labs 07/09/14: K 5.3 Creatinine 1.20 Pro BNP 1510   PMH: 1. Diastolic CHF 2. PAD: Bilateral [employer] stents in 2002.  She has known significant right SFA disease.  Followed by Dr. Fletcher [name].   3. CKD 4. HTN 5. Hyperlipidemia 6. Type II diabetes 7. LHC in 5/12 showed luminal irregularities.  8. Right THR.  9. Atrial fibrillation: Persistent.  Not an anticoagulation candidate due to fall risk.  10. Carotid stenosis 11. Aortic stenosis: Echo (11/15) with EF 65-70%, severe LVH, mild AS with mean gradient 11 mmHg, mild AI, mild MS, PA systolic pressure 43 mmHg.   SH: Nonsmoker.  Lives with son in [place].  FH: CAD  ROS: All systems reviewed and negative except as per HPI.   Current Outpatient Prescriptions  Medication Sig Dispense Refill  . Ascorbic Acid (VITAMIN C) 500 MG  tablet Take 500 mg by mouth daily.      Marland Kitchen aspirin EC 81 MG tablet Take 81 mg by mouth 2 (two) times daily.    Marland Kitchen b complex vitamins tablet Take 1 tablet by mouth daily.      . Cholecalciferol (VITAMIN D3) 2000 UNITS capsule Take 2,000 Units by mouth daily.      . fish oil-omega-3 fatty acids 1000 MG capsule Take 2 g by mouth 2 (two) times daily.     . furosemide (LASIX) 20 MG tablet Take 2 tablets (40 mg total) by mouth daily. 60 tablet 3  . gabapentin (NEURONTIN) 300 MG capsule Take 600 mg by mouth at bedtime.    . Insulin Infusion Pump Supplies (INSET INFUSION SET 23" 6MM) MISC 1 Device by Does not apply route every 3 (three) days.    . Insulin Infusion Pump Supplies (INSULIN PUMP SYRINGE RESERVOIR) MISC 1 Device by Does not apply route every 3 (three) days. Animas 2 ml    . insulin lispro (HUMALOG) 100 UNIT/ML injection For use in pump, total of 70 units per day 7 vial 0  . metoprolol (LOPRESSOR) 100 MG tablet Take 1 tablet (100 mg total) by mouth 2 (two) times daily. 30 tablet 0  . omeprazole (PRILOSEC) 40 MG capsule Take 40 mg by mouth daily before breakfast.     . pravastatin (PRAVACHOL) 20  MG tablet TAKE 1 TABLET BY MOUTH IN THE EVENING (Patient taking differently: Take 20 mg by mouth daily. TAKE 1 TABLET BY MOUTH IN THE EVENING) 30 tablet 11  . Probiotic Product (PROBIOTIC PO) Take 1 tablet by mouth daily at 10 pm.    . traMADol (ULTRAM) 50 MG tablet TAKE 2 TABLETS BY MOUTH EVERY 6 HOURS AS NEEDED FOR PAIN 120 tablet 3  . valsartan (DIOVAN) 320 MG tablet Take 320 mg by mouth daily.     No current facility-administered medications for this encounter.    BP 120/54 mmHg  Pulse 81  Wt 147 lb (66.679 kg)  SpO2 92% General: NAD Neck: JVP to jaw, no thyromegaly or thyroid nodule.  Lungs: Clear to auscultation bilaterally with normal respiratory effort. CV: Nondisplaced PMI.  Heart regular S1/S2, no S3/S4, 2/6 SEM RUSB with clear S2.    No carotid bruit.  Normal pedal pulses.  Abdomen:  Soft, nontender, no hepatosplenomegaly, no distention.  Skin: Intact without lesions or rashes.  Neurologic: Alert and oriented x 3.  Psych: Normal affect. Extremities: No clubbing or cyanosis. RLE and LLE 2 -3+ edema.   HEENT: Normal.   Assessment/Plan:  1. Chronic diastolic CHF: Probably NYHA class III symptoms.    Volume status elevated. Increase lasix to 60 mg daily for 2 days. I would like to increase furher but declines. Weight at home trending up about 6 pounds.  Will refer back to [facility]. Refer to [facility]  2. CKD: Most recent Creatinine 1.2.   3. Atrial fibrilation: Chronic, reasonable rate control.  [name] not on anticoagulation due to fall risk.  4. HTN: Mildly elevated BP.  Will continue current meds.  5. Aortic stenosis: Mild on last echo.    Follow up in 1 month  CLEGG,AMY NP-C  08/03/2014

## 2014-08-11 LAB — HM MAMMOGRAPHY
HM Mammogram: NEGATIVE
HM Mammogram: NORMAL

## 2014-08-16 ENCOUNTER — Encounter: Payer: Self-pay | Admitting: Internal Medicine

## 2014-08-17 ENCOUNTER — Other Ambulatory Visit: Payer: Self-pay | Admitting: Internal Medicine

## 2014-08-17 ENCOUNTER — Telehealth (HOSPITAL_COMMUNITY): Payer: Self-pay | Admitting: Vascular Surgery

## 2014-08-17 DIAGNOSIS — I5022 Chronic systolic (congestive) heart failure: Secondary | ICD-10-CM

## 2014-08-17 NOTE — Telephone Encounter (Signed)
Refill Valsatan sent to [facility] in [place]

## 2014-08-18 MED ORDER — VALSARTAN 320 MG PO TABS: 320.0000 mg | ORAL_TABLET | Freq: Every day | ORAL | Status: DC

## 2014-08-20 HISTORY — PX: SKIN CANCER EXCISION: SHX779

## 2014-08-27 ENCOUNTER — Ambulatory Visit (INDEPENDENT_AMBULATORY_CARE_PROVIDER_SITE_OTHER): Payer: Medicare Other

## 2014-08-27 VITALS — BP 116/45 | HR 76 | Resp 18

## 2014-08-27 DIAGNOSIS — B351 Tinea unguium: Secondary | ICD-10-CM

## 2014-08-27 DIAGNOSIS — M79676 Pain in unspecified toe(s): Secondary | ICD-10-CM | POA: Diagnosis not present

## 2014-08-27 DIAGNOSIS — E1351 Other specified diabetes mellitus with diabetic peripheral angiopathy without gangrene: Secondary | ICD-10-CM | POA: Diagnosis not present

## 2014-08-27 DIAGNOSIS — E1140 Type 2 diabetes mellitus with diabetic neuropathy, unspecified: Secondary | ICD-10-CM

## 2014-08-27 NOTE — Progress Notes (Signed)
   Subjective:    Patient ID: Emma Yu, female    DOB: Nov 01, 1926, 81 y.o.   MRN: [id number]  HPI I AM HERE TO GET MY TOENAILS TRIMMED UP AND THEY ARE THICK AND DISCOLORED AND THERE IS SOME PAIN AND I HAD SOME FLUID BUILD UP ON MY LEFT LEG AND THAT WAS FOUR WEEKS AGO    Review of Systems  Cardiovascular:       HEART ATTACK FOUR YEARS AGO  Musculoskeletal: Positive for back pain.  Hematological: Bruises/bleeds easily.       SLOW TO HEAL  All other systems reviewed and are negative.      Objective:   Physical Exam 81 year old white female well-developed well-nourished oriented 3 presents at this time for diabetic foot and nail care is been 5 years or more since she was last seen here. Been doing self-care or seen at a nail salon recent past. Patient has thick brittle crumbly dystrophic painful nails 1 through 5 bilateral. Patient does have a history of diabetes on insulin at this time using an insulin pump adult onset type 2 diabetes which appears to be well-managed in stable. Patient does have a history of neuropathy with absent sensation on Semmes Weinstein to the forefoot digits and arch is also angiopathy or vascular compromise with atrophy of the skin and foot legs and muscle structures of the feet bilateral. Has limited motion of the ankle subtalar mid tarsus joint with rigid digital contractures noted as well. Thick skin with varicosities and almost appears to be a stasis-type atrophy of the skin of lower legs bilateral. Patient does have some paresthesias burning stinging and abnormal sensation has some claudication can only walk a short distance without having to take a break. Orthopedic exam otherwise unremarkable except for rigidity and atrophy of muscle structures.      Assessment & Plan:  Assessment this time is onychomycosis of nails painful brittle crumbly dystrophic friable nails 1 through 5 bilateral debrided and the presence of pain symptomology as well as diabetes  and complications. Lumicain and Neosporin applied to the first left following debridement maintain appropriate accommodative shoes follow-up in 2-3 months for continued palliative care in the future as needed follow-up in 3 months  Harriet Masson DPM

## 2014-08-27 NOTE — Patient Instructions (Signed)
Diabetes and Foot Care Diabetes may cause you to have problems because of poor blood supply (circulation) to your feet and legs. This may cause the skin on your feet to become thinner, break easier, and heal more slowly. Your skin may become dry, and the skin may peel and crack. You may also have nerve damage in your legs and feet causing decreased feeling in them. You may not notice minor injuries to your feet that could lead to infections or more serious problems. Taking care of your feet is one of the most important things you can do for yourself.  HOME CARE INSTRUCTIONS  Wear shoes at all times, even in the house. Do not go barefoot. Bare feet are easily injured.  Check your feet daily for blisters, cuts, and redness. If you cannot see the bottom of your feet, use a mirror or ask someone for help.  Wash your feet with warm water (do not use hot water) and mild soap. Then pat your feet and the areas between your toes until they are completely dry. Do not soak your feet as this can dry your skin.  Apply a moisturizing lotion or petroleum jelly (that does not contain alcohol and is unscented) to the skin on your feet and to dry, brittle toenails. Do not apply lotion between your toes.  Trim your toenails straight across. Do not dig under them or around the cuticle. File the edges of your nails with an emery board or nail file.  Do not cut corns or calluses or try to remove them with medicine.  Wear clean socks or stockings every day. Make sure they are not too tight. Do not wear knee-high stockings since they may decrease blood flow to your legs.  Wear shoes that fit properly and have enough cushioning. To break in new shoes, wear them for just a few hours a day. This prevents you from injuring your feet. Always look in your shoes before you put them on to be sure there are no objects inside.  Do not cross your legs. This may decrease the blood flow to your feet.  If you find a minor scrape,  cut, or break in the skin on your feet, keep it and the skin around it clean and dry. These areas may be cleansed with mild soap and water. Do not cleanse the area with peroxide, alcohol, or iodine.  When you remove an adhesive bandage, be sure not to damage the skin around it.  If you have a wound, look at it several times a day to make sure it is healing.  Do not use heating pads or hot water bottles. They may burn your skin. If you have lost feeling in your feet or legs, you may not know it is happening until it is too late.  Make sure your health care provider performs a complete foot exam at least annually or more often if you have foot problems. Report any cuts, sores, or bruises to your health care provider immediately. SEEK MEDICAL CARE IF:   You have an injury that is not healing.  You have cuts or breaks in the skin.  You have an ingrown nail.  You notice redness on your legs or feet.  You feel burning or tingling in your legs or feet.  You have pain or cramps in your legs and feet.  Your legs or feet are numb.  Your feet always feel cold. SEEK IMMEDIATE MEDICAL CARE IF:   There is increasing redness,   swelling, or pain in or around a wound.  There is a red line that goes up your leg.  Pus is coming from a wound.  You develop a fever or as directed by your health care provider.  You notice a bad smell coming from an ulcer or wound. Document Released: 08/03/2000 Document Revised: 04/08/2013 Document Reviewed: 01/13/2013 ExitCare Patient Information 2015 [employer]. This information is not intended to replace advice given to you by your health care provider. Make sure you discuss any questions you have with your health care provider.  

## 2014-09-13 ENCOUNTER — Ambulatory Visit (HOSPITAL_COMMUNITY)
Admission: RE | Admit: 2014-09-13 | Discharge: 2014-09-13 | Disposition: A | Discharge status: Home / Self Care | Payer: Medicare Other | Source: Ambulatory Visit | Attending: Cardiology | Admitting: Cardiology

## 2014-09-13 VITALS — BP 140/68 | HR 105 | Wt 144.4 lb

## 2014-09-13 DIAGNOSIS — I4819 Other persistent atrial fibrillation: Secondary | ICD-10-CM

## 2014-09-13 DIAGNOSIS — I739 Peripheral vascular disease, unspecified: Secondary | ICD-10-CM | POA: Insufficient documentation

## 2014-09-13 DIAGNOSIS — R0602 Shortness of breath: Secondary | ICD-10-CM

## 2014-09-13 DIAGNOSIS — E785 Hyperlipidemia, unspecified: Secondary | ICD-10-CM | POA: Diagnosis not present

## 2014-09-13 DIAGNOSIS — E119 Type 2 diabetes mellitus without complications: Secondary | ICD-10-CM | POA: Insufficient documentation

## 2014-09-13 DIAGNOSIS — I350 Nonrheumatic aortic (valve) stenosis: Secondary | ICD-10-CM | POA: Insufficient documentation

## 2014-09-13 DIAGNOSIS — Z79899 Other long term (current) drug therapy: Secondary | ICD-10-CM | POA: Insufficient documentation

## 2014-09-13 DIAGNOSIS — I5032 Chronic diastolic (congestive) heart failure: Secondary | ICD-10-CM | POA: Diagnosis not present

## 2014-09-13 DIAGNOSIS — I5033 Acute on chronic diastolic (congestive) heart failure: Secondary | ICD-10-CM

## 2014-09-13 DIAGNOSIS — I481 Persistent atrial fibrillation: Secondary | ICD-10-CM | POA: Insufficient documentation

## 2014-09-13 DIAGNOSIS — I6529 Occlusion and stenosis of unspecified carotid artery: Secondary | ICD-10-CM | POA: Diagnosis not present

## 2014-09-13 DIAGNOSIS — I129 Hypertensive chronic kidney disease with stage 1 through stage 4 chronic kidney disease, or unspecified chronic kidney disease: Secondary | ICD-10-CM | POA: Diagnosis not present

## 2014-09-13 DIAGNOSIS — Z7982 Long term (current) use of aspirin: Secondary | ICD-10-CM | POA: Insufficient documentation

## 2014-09-13 DIAGNOSIS — N189 Chronic kidney disease, unspecified: Secondary | ICD-10-CM | POA: Diagnosis not present

## 2014-09-13 DIAGNOSIS — Z794 Long term (current) use of insulin: Secondary | ICD-10-CM | POA: Diagnosis not present

## 2014-09-13 LAB — BASIC METABOLIC PANEL
Anion gap: 7 (ref 5–15)
BUN: 46 mg/dL — AB (ref 6–23)
CALCIUM: 10.5 mg/dL (ref 8.4–10.5)
CO2: 27 mmol/L (ref 19–32)
CREATININE: 1.12 mg/dL — AB (ref 0.50–1.10)
Chloride: 105 mmol/L (ref 96–112)
GFR calc non Af Amer: 44 mL/min — ABNORMAL LOW (ref >90)
GFR, EST AFRICAN AMERICAN: 50 mL/min — AB (ref >90)
GLUCOSE: 130 mg/dL — AB (ref 70–99)
Potassium: 4.5 mmol/L (ref 3.5–5.1)
Sodium: 139 mmol/L (ref 135–145)

## 2014-09-13 LAB — BRAIN NATRIURETIC PEPTIDE: B Natriuretic Peptide: 269.2 pg/mL — ABNORMAL HIGH (ref 0.0–100.0)

## 2014-09-13 MED ORDER — FUROSEMIDE 20 MG PO TABS: 60.0000 mg | ORAL_TABLET | Freq: Every day | ORAL | Status: DC

## 2014-09-13 NOTE — Progress Notes (Signed)
Patient ID: KHAMILA BASSINGER, female   DOB: September 27, 1926, 81 y.o.   MRN: [id number] PCP: Dr. Ronnald Ramp  81 yo with history of diastolic CHF, PAD, CKD, mild aortic stenosis, and persistent atrial fibrillation presents for CHF clinic evaluation. Admitted November 2015 to [facility] with acute on chronic diastolic CHF.  She had quit taking Lasix for about 2 wks prior to admission because she did not like having to go to the bathroom a lot.  In the hospital, she developed AKI. [name] now home with her son.  [name] taking Lasix 20 mg daily.  In the past, she took Lasix 40 mg daily.    She returns for follow up. Last visit lasix was increased to 60 mg daily for 2 days. Weight at home 140-141 pounds. Denies SOB/PND/Orthopnea. Appetite ok. Sleeps in a recliner for comfort.  Limiting fluid intake to < 2 liters per day. Eating some take out foods. Ambulates with a walker. Chronic lower extremity edema. Taking medications.   Lower extremity US in 10/15 showed no DVT.  Last echo in 11/15 showed mild AS, mild AI, mild MS, and EF 60-65%.    Labs (11/15): K 5.8, creatinine 1.5 Labs 07/09/14: K 5.3 Creatinine 1.20 Pro BNP 1510   PMH: 1. Diastolic CHF 2. PAD: Bilateral [employer] stents in 2002.  She has known significant right SFA disease.  Followed by Dr. Fletcher [name].   3. CKD 4. HTN 5. Hyperlipidemia 6. Type II diabetes 7. LHC in 5/12 showed luminal irregularities.  8. Right THR.  9. Atrial fibrillation: Persistent.  Not an anticoagulation candidate due to fall risk.  10. Carotid stenosis 11. Aortic stenosis: Echo (11/15) with EF 65-70%, severe LVH, mild AS with mean gradient 11 mmHg, mild AI, mild MS, PA systolic pressure 43 mmHg.   SH: Nonsmoker.  Lives with son in [place].  FH: CAD  ROS: All systems reviewed and negative except as per HPI.   Current Outpatient Prescriptions  Medication Sig Dispense Refill  . Ascorbic Acid (VITAMIN C) 500 MG tablet Take 500 mg by mouth daily.      Marland Kitchen aspirin EC 81 MG tablet  Take 81 mg by mouth 2 (two) times daily.    Marland Kitchen b complex vitamins tablet Take 1 tablet by mouth daily.      . Cholecalciferol (VITAMIN D3) 2000 UNITS capsule Take 2,000 Units by mouth daily.      . ciprofloxacin (CIPRO) 250 MG tablet   1  . cloNIDine (CATAPRES) 0.2 MG tablet Take 0.2 mg by mouth 2 (two) times daily.  11  . fish oil-omega-3 fatty acids 1000 MG capsule Take 2 g by mouth 2 (two) times daily.     . furosemide (LASIX) 20 MG tablet Take 2 tablets (40 mg total) by mouth daily. 60 tablet 3  . furosemide (LASIX) 40 MG tablet Take 40 mg by mouth daily.  5  . gabapentin (NEURONTIN) 300 MG capsule Take 600 mg by mouth at bedtime.    . Insulin Infusion Pump Supplies (INSET INFUSION SET 23" 6MM) MISC 1 Device by Does not apply route every 3 (three) days.    . Insulin Infusion Pump Supplies (INSULIN PUMP SYRINGE RESERVOIR) MISC 1 Device by Does not apply route every 3 (three) days. Animas 2 ml    . insulin lispro (HUMALOG) 100 UNIT/ML injection For use in pump, total of 70 units per day 7 vial 0  . metoprolol (LOPRESSOR) 100 MG tablet Take 1 tablet (100 mg total) by mouth  2 (two) times daily. 60 tablet 3  . omeprazole (PRILOSEC) 40 MG capsule Take 40 mg by mouth daily before breakfast.     . pravastatin (PRAVACHOL) 20 MG tablet Take 1 tablet (20 mg total) by mouth daily. TAKE 1 TABLET BY MOUTH IN THE EVENING 30 tablet 11  . Probiotic Product (PROBIOTIC PO) Take 1 tablet by mouth daily at 10 pm.    . traMADol (ULTRAM) 50 MG tablet TAKE 2 TABLETS BY MOUTH EVERY 6 HOURS AS NEEDED FOR PAIN 120 tablet 3  . valsartan (DIOVAN) 320 MG tablet Take 320 mg by mouth daily.     No current facility-administered medications for this encounter.    BP 140/68 mmHg  Pulse 105  Wt 144 lb 6.4 oz (65.499 kg)  SpO2 92% General: NAD Neck: JVP to jaw, no thyromegaly or thyroid nodule.  Lungs: Clear to auscultation bilaterally with normal respiratory effort. CV: Nondisplaced PMI.  Heart irregular S1/S2, no  S3/S4, 2/6 SEM RUSB with clear S2.    Bilateral radiated carotid bruit.  Normal pedal pulses.  Abdomen: Soft, nontender, no hepatosplenomegaly, + distention.  Skin: Intact without lesions or rashes.  Neurologic: Alert and oriented x 3.  Psych: Normal affect. Extremities: No clubbing or cyanosis. RLE and LLE 2 + edema.   HEENT: Normal.   Assessment/Plan:  1. Chronic diastolic CHF: Probably NYHA class III symptoms.   Volume status elevated despite weight loss.  Increase lasix to 60 mg daily.  Will refer back to [facility]. Refer to [facility]  2. CKD: Most recent Creatinine 1.2.   3. Atrial fibrilation: Chronic reasonable rate control.  [name] not on anticoagulation due to fall risk.  4. HTN: Mildly elevated BP.  Will continue current meds.  5. Aortic stenosis: Mild on last echo.   Check BMET and BNP   Follow up in 1 month  CLEGG,AMY NP-C  09/13/2014  Patient seen and examined with Darrick Grinder, NP. We discussed all aspects of the encounter. I agree with the assessment and plan as stated above.   Remains volume overloaded. Very resistant to increasing diuretic as she feels she has to go to bathroom too much. She has agreed to increase lasix to 60 daily.  Check labs today and in 1 week.   Dyanna Seiter,MD 4:26 PM

## 2014-09-13 NOTE — Patient Instructions (Signed)
INCREASE Lasix to 60mg  (3 tablets) once daily.  Follow up 4 weeks.  Do the following things EVERYDAY: 1) Weigh yourself in the morning before breakfast. Write it down and keep it in a log. 2) Take your medicines as prescribed 3) Eat low salt foods-Limit salt (sodium) to 2000 mg per day.  4) Stay as active as you can everyday 5) Limit all fluids for the day to less than 2 liters

## 2014-09-14 ENCOUNTER — Ambulatory Visit: Payer: Medicare Other | Admitting: Endocrinology

## 2014-09-16 DIAGNOSIS — E109 Type 1 diabetes mellitus without complications: Secondary | ICD-10-CM | POA: Diagnosis not present

## 2014-09-27 ENCOUNTER — Encounter: Payer: Self-pay | Admitting: Internal Medicine

## 2014-10-14 ENCOUNTER — Ambulatory Visit (HOSPITAL_COMMUNITY)
Admission: RE | Admit: 2014-10-14 | Discharge: 2014-10-14 | Disposition: A | Discharge status: Home / Self Care | Payer: Medicare Other | Source: Ambulatory Visit | Attending: Cardiology | Admitting: Cardiology

## 2014-10-14 VITALS — BP 156/64 | HR 65 | Wt 135.5 lb

## 2014-10-14 DIAGNOSIS — E785 Hyperlipidemia, unspecified: Secondary | ICD-10-CM | POA: Diagnosis not present

## 2014-10-14 DIAGNOSIS — I350 Nonrheumatic aortic (valve) stenosis: Secondary | ICD-10-CM | POA: Insufficient documentation

## 2014-10-14 DIAGNOSIS — Z8249 Family history of ischemic heart disease and other diseases of the circulatory system: Secondary | ICD-10-CM | POA: Diagnosis not present

## 2014-10-14 DIAGNOSIS — N189 Chronic kidney disease, unspecified: Secondary | ICD-10-CM | POA: Insufficient documentation

## 2014-10-14 DIAGNOSIS — Z794 Long term (current) use of insulin: Secondary | ICD-10-CM | POA: Diagnosis not present

## 2014-10-14 DIAGNOSIS — Z9641 Presence of insulin pump (external) (internal): Secondary | ICD-10-CM | POA: Diagnosis not present

## 2014-10-14 DIAGNOSIS — I6529 Occlusion and stenosis of unspecified carotid artery: Secondary | ICD-10-CM | POA: Insufficient documentation

## 2014-10-14 DIAGNOSIS — Z7982 Long term (current) use of aspirin: Secondary | ICD-10-CM | POA: Diagnosis not present

## 2014-10-14 DIAGNOSIS — Z79899 Other long term (current) drug therapy: Secondary | ICD-10-CM | POA: Diagnosis not present

## 2014-10-14 DIAGNOSIS — E119 Type 2 diabetes mellitus without complications: Secondary | ICD-10-CM | POA: Insufficient documentation

## 2014-10-14 DIAGNOSIS — I482 Chronic atrial fibrillation, unspecified: Secondary | ICD-10-CM

## 2014-10-14 DIAGNOSIS — I5032 Chronic diastolic (congestive) heart failure: Secondary | ICD-10-CM | POA: Diagnosis not present

## 2014-10-14 DIAGNOSIS — I129 Hypertensive chronic kidney disease with stage 1 through stage 4 chronic kidney disease, or unspecified chronic kidney disease: Secondary | ICD-10-CM | POA: Diagnosis not present

## 2014-10-14 DIAGNOSIS — I050 Rheumatic mitral stenosis: Secondary | ICD-10-CM | POA: Diagnosis not present

## 2014-10-14 DIAGNOSIS — I4891 Unspecified atrial fibrillation: Secondary | ICD-10-CM | POA: Insufficient documentation

## 2014-10-14 LAB — BASIC METABOLIC PANEL
Anion gap: 12 (ref 5–15)
BUN: 55 mg/dL — ABNORMAL HIGH (ref 6–23)
CO2: 29 mmol/L (ref 19–32)
Calcium: 10.7 mg/dL — ABNORMAL HIGH (ref 8.4–10.5)
Chloride: 102 mmol/L (ref 96–112)
Creatinine, Ser: 1.49 mg/dL — ABNORMAL HIGH (ref 0.50–1.10)
GFR calc non Af Amer: 31 mL/min — ABNORMAL LOW (ref >90)
GFR, EST AFRICAN AMERICAN: 36 mL/min — AB (ref >90)
Glucose, Bld: 163 mg/dL — ABNORMAL HIGH (ref 70–99)
POTASSIUM: 4.8 mmol/L (ref 3.5–5.1)
Sodium: 143 mmol/L (ref 135–145)

## 2014-10-14 NOTE — Progress Notes (Signed)
Patient ID: Emma Yu, female   DOB: 11-19-1926, 81 y.o.   MRN: [id number] PCP: Dr. Ronnald Ramp  81 yo with history of diastolic CHF, PAD, CKD, mild aortic stenosis, and persistent atrial fibrillation presents for CHF clinic evaluation. Admitted November 2015 to [facility] with acute on chronic diastolic CHF.  She had quit taking Lasix for about 2 wks prior to admission because she did not like having to go to the bathroom a lot.  In the hospital, she developed AKI. [name] now home with her son.  [name] taking Lasix 20 mg daily.  In the past, she took Lasix 40 mg daily.    She returns for follow up. Last visit lasix was increased to 60 mg daily. Weight at home 140-141 pounds. Denies SOB/PND/Orthopnea. Appetite ok. Sleeps in a recliner for comfort.  Limiting fluid intake to < 2 liters per day. Eating some take out foods. Ambulates with a walker. Chronic lower extremity edema. Taking medications.   Lower extremity US in 10/15 showed no DVT.  Last echo in 11/15 showed mild AS, mild AI, mild MS, and EF 60-65%.    Labs (11/15): K 5.8, creatinine 1.5 Labs 07/09/14: K 5.3 Creatinine 1.20 Pro BNP 1510  Labs 09/13/2014: K 4.5 Creatinine 1.12 BNP 160  PMH: 1. Diastolic CHF 2. PAD: Bilateral CIA stents in 2002.  She has known significant right SFA disease.  Followed by Dr. Fletcher [name].   3. CKD 4. HTN 5. Hyperlipidemia 6. Type II diabetes 7. LHC in 5/12 showed luminal irregularities.  8. Right THR.  9. Atrial fibrillation: Persistent.  Not an anticoagulation candidate due to fall risk.  10. Carotid stenosis 11. Aortic stenosis: Echo (11/15) with EF 65-70%, severe LVH, mild AS with mean gradient 11 mmHg, mild AI, mild MS, PA systolic pressure 43 mmHg.   SH: Nonsmoker.  Lives with son in [place].  FH: CAD  ROS: All systems reviewed and negative except as per HPI.   Current Outpatient Prescriptions  Medication Sig Dispense Refill  . Ascorbic Acid (VITAMIN C) 500 MG tablet Take 500 mg by mouth  daily.      Marland Kitchen aspirin EC 81 MG tablet Take 81 mg by mouth 2 (two) times daily.    Marland Kitchen b complex vitamins tablet Take 1 tablet by mouth daily.      . Cholecalciferol (VITAMIN D3) 2000 UNITS capsule Take 2,000 Units by mouth daily.      . ciprofloxacin (CIPRO) 250 MG tablet   1  . cloNIDine (CATAPRES) 0.2 MG tablet Take 0.2 mg by mouth 2 (two) times daily.  11  . fish oil-omega-3 fatty acids 1000 MG capsule Take 2 g by mouth 2 (two) times daily.     . furosemide (LASIX) 20 MG tablet Take 3 tablets (60 mg total) by mouth daily. 90 tablet 3  . gabapentin (NEURONTIN) 300 MG capsule Take 600 mg by mouth at bedtime.    . Insulin Infusion Pump Supplies (INSET INFUSION SET 23" 6MM) MISC 1 Device by Does not apply route every 3 (three) days.    . Insulin Infusion Pump Supplies (INSULIN PUMP SYRINGE RESERVOIR) MISC 1 Device by Does not apply route every 3 (three) days. Animas 2 ml    . insulin lispro (HUMALOG) 100 UNIT/ML injection For use in pump, total of 70 units per day 7 vial 0  . metoprolol (LOPRESSOR) 100 MG tablet Take 1 tablet (100 mg total) by mouth 2 (two) times daily. 60 tablet 3  . omeprazole (  PRILOSEC) 40 MG capsule Take 40 mg by mouth daily before breakfast.     . pravastatin (PRAVACHOL) 20 MG tablet Take 1 tablet (20 mg total) by mouth daily. TAKE 1 TABLET BY MOUTH IN THE EVENING 30 tablet 11  . Probiotic Product (PROBIOTIC PO) Take 1 tablet by mouth daily at 10 pm.    . traMADol (ULTRAM) 50 MG tablet TAKE 2 TABLETS BY MOUTH EVERY 6 HOURS AS NEEDED FOR PAIN 120 tablet 3  . valsartan (DIOVAN) 320 MG tablet Take 320 mg by mouth daily.     No current facility-administered medications for this encounter.    BP 156/64 mmHg  Pulse 65  Wt 135 lb 8 oz (61.462 kg)  SpO2 96% General: NAD Neck: JVP 5-6, no thyromegaly or thyroid nodule.  Lungs: Clear to auscultation bilaterally with normal respiratory effort. CV: Nondisplaced PMI.  Heart irregular S1/S2, no S3/S4, 2/6 SEM RUSB with clear S2.     Bilateral radiated carotid bruit.  Normal pedal pulses.  Abdomen: Soft, nontender, no hepatosplenomegaly, + distention.  Skin: Intact without lesions or rashes.  Neurologic: Alert and oriented x 3.  Psych: Normal affect. Extremities: No clubbing or cyanosis. RLE and LLE no  edema.   HEENT: Normal.   Assessment/Plan:  1. Chronic diastolic CHF: Probably NYHA class II-III symptoms.   Volume status much improved.   Continue lasix to 60 mg daily.  Reinforced daily weights, low salt food choices, and limiting fluid intake to< 2 liters per day.  2. CKD: Most recent Creatinine 1.2.  Check BMET today.  3. Atrial fibrilation: Chronic reasonable rate control.  [name] not on anticoagulation due to fall risk.    Follow up in 6 months with Dr Johnsie Cancel. Return to HF clinic as needed.   Jerri Glauser NP-C  10/14/2014

## 2014-10-14 NOTE — Patient Instructions (Signed)
Lab today  Your physician recommends that you schedule a follow-up with Dr Johnsie Cancel in 6 months

## 2014-10-15 ENCOUNTER — Ambulatory Visit (INDEPENDENT_AMBULATORY_CARE_PROVIDER_SITE_OTHER): Payer: Self-pay | Admitting: Endocrinology

## 2014-10-15 ENCOUNTER — Encounter: Payer: Self-pay | Admitting: Endocrinology

## 2014-10-15 VITALS — BP 114/56 | HR 88 | Temp 98.2°F | Ht 65.0 in | Wt 145.0 lb

## 2014-10-15 DIAGNOSIS — L98499 Non-pressure chronic ulcer of skin of other sites with unspecified severity: Secondary | ICD-10-CM | POA: Diagnosis not present

## 2014-10-15 DIAGNOSIS — E119 Type 2 diabetes mellitus without complications: Secondary | ICD-10-CM | POA: Diagnosis not present

## 2014-10-15 LAB — MICROALBUMIN / CREATININE URINE RATIO
Creatinine,U: 54.2 mg/dL
MICROALB UR: 1.2 mg/dL (ref 0.0–1.9)
MICROALB/CREAT RATIO: 2.2 mg/g (ref 0.0–30.0)

## 2014-10-15 LAB — HEMOGLOBIN A1C: HEMOGLOBIN A1C: 8.1 % — AB (ref 4.6–6.5)

## 2014-10-15 NOTE — Patient Instructions (Addendum)
diabetes blood and urine tests are requested for you today.  We'll contact you with results.  take a basal rate of 0.8 unit/hr, 24 hrs per day.   Take a bolus of 11 units with each meal.  (if today's blood test is high, we can increase this) Take a correction bolus (which some people call "sensitivity," or "insulin sensitivity ratio," or just "isr") of 1 unit for each by 25 which your glucose exceeds 120. check your blood sugar 4 times a day--before the 3 meals, and at bedtime.  also check if you have symptoms of your blood sugar being too high or too low.  please keep a record of the readings and bring it to your next appointment here.  please call us sooner if you are having low blood sugar episodes. Please make a follow-up appointment in 3 months.   Please see a wound specialist.  you will receive a phone call, about a day and time for an appointment.

## 2014-10-15 NOTE — Progress Notes (Signed)
Subjective:    Patient ID: Emma Yu, female    DOB: April 07, 1927, 81 y.o.   MRN: [id number]  HPI Pt returns for f/u of diabetes mellitus: DM type: 1 Dx'ed: 9201 Complications: CAD, nephropathy, and PAD Therapy: insulin since 2001 GDM: never DKA: never Severe hypoglycemia: never Pancreatitis: never Other: she has a one-touch ping insulin pump; she has declined continuous glucose monitor Interval history: She averages a total of approx 60 units of humalog per day, via her pump.  She uses these settings: basal rate of 0.8 unit/hr, 24 hrs per day.   bolus of 11 units with each meal.   correction bolus (which some people call "sensitivity," or "insulin sensitivity ratio," or just "isr") of 1 unit for each by 25 which your glucose exceeds 120.  she brings a record of her cbg's which i have reviewed today.  i have scanned it into epic.  It varies from 98-300, but most are in the low-100's Pt states 2 years of an ulcer at the right temporal area. No assoc itching.   Past Medical History  Diagnosis Date  . PAD (peripheral artery disease)     s/p bilateral comon iliac artery stenting in 2002. Known significant  R SFA  disease. carotid dz noted 11/2011 followed by Dr. Bridgett Larsson  . Diabetes mellitus   . Hypertension   . Hyperlipidemia   . Venous insufficiency   . Chronic diastolic heart failure     Echocardiogram 12/10/11: Moderate LVH, EF 65-70%, moderate aortic stenosis, mean gradient 20, mild MS  . Atrial fibrillation     not a coumadin candidate secondary to fall risk  . DJD (degenerative joint disease) of hip     s/p R THR 10/2010  . Aortic stenosis     Moderate by echo 4/13 - mean 20 mmHg  . Mitral stenosis     Mild by echo 4/13  . Anemia     iron deficient  . Acute respiratory failure     12/2011 admission - decreased O2 sats on ambulation, improved by time of discharge  . CAD (coronary artery disease)     minimal plaque by cath 5/12  . CHF (congestive heart failure)     EF  55-60%    Past Surgical History  Procedure Laterality Date  . Total hip arthroplasty  1991, 1994    redo in 1994  . Lumbar disc surgery  1999  . Cardiac catheterization  01/10/2011    No significant CAD  . Cholecystectomy    . Abdominal hysterectomy    . Cataract extraction  2015    History   Social History  . Marital Status: Widowed    Spouse Name: N/A  . Number of Children: N/A  . Years of Education: N/A   Occupational History  .      retired   Social History Main Topics  . Smoking status: Former Smoker -- 2.00 packs/day for 25 years    Types: Cigarettes    Quit date: 08/20/1985  . Smokeless tobacco: Never Used     Comment: Retired-widowed 89, lives with son  . Alcohol Use: No  . Drug Use: No  . Sexual Activity: Not Currently    Birth Control/ Protection: Surgical   Other Topics Concern  . Not on file   Social History Narrative   Lives with son   Widowed 54   Quit smoking in 1987    Current Outpatient Prescriptions on File Prior to Visit  Medication Sig Dispense  Refill  . Ascorbic Acid (VITAMIN C) 500 MG tablet Take 500 mg by mouth daily.      Marland Kitchen aspirin EC 81 MG tablet Take 81 mg by mouth 2 (two) times daily.    Marland Kitchen b complex vitamins tablet Take 1 tablet by mouth daily.      . Cholecalciferol (VITAMIN D3) 2000 UNITS capsule Take 2,000 Units by mouth daily.      . fish oil-omega-3 fatty acids 1000 MG capsule Take 2 g by mouth 2 (two) times daily.     . furosemide (LASIX) 20 MG tablet Take 3 tablets (60 mg total) by mouth daily. 90 tablet 3  . gabapentin (NEURONTIN) 300 MG capsule Take 600 mg by mouth at bedtime.    . Insulin Infusion Pump Supplies (INSET INFUSION SET 23" 6MM) MISC 1 Device by Does not apply route every 3 (three) days.    . Insulin Infusion Pump Supplies (INSULIN PUMP SYRINGE RESERVOIR) MISC 1 Device by Does not apply route every 3 (three) days. Animas 2 ml    . insulin lispro (HUMALOG) 100 UNIT/ML injection For use in pump, total of 70  units per day 7 vial 0  . metoprolol (LOPRESSOR) 100 MG tablet Take 1 tablet (100 mg total) by mouth 2 (two) times daily. 60 tablet 3  . omeprazole (PRILOSEC) 40 MG capsule Take 40 mg by mouth daily before breakfast.     . pravastatin (PRAVACHOL) 20 MG tablet Take 1 tablet (20 mg total) by mouth daily. TAKE 1 TABLET BY MOUTH IN THE EVENING 30 tablet 11  . Probiotic Product (PROBIOTIC PO) Take 1 tablet by mouth daily at 10 pm.    . traMADol (ULTRAM) 50 MG tablet TAKE 2 TABLETS BY MOUTH EVERY 6 HOURS AS NEEDED FOR PAIN 120 tablet 3  . valsartan (DIOVAN) 320 MG tablet Take 320 mg by mouth daily.     No current facility-administered medications on file prior to visit.    Allergies  Allergen Reactions  . Carvedilol Other (See Comments)    Heart stops  . Clindamycin/Lincomycin Other (See Comments)    tremors  . Diltiazem Hcl Other (See Comments)    Chest pain;   07/02/14-  Patient has been able to tolerate diltiazem PO as inpatient since 06/29/14 without any adverse reaction.  . Fenofibrate Other (See Comments)    Unknown - [facility] - MAR  . Hctz [Hydrochlorothiazide] Other (See Comments)    Chest pain  . Nisoldipine Other (See Comments)    Chest pain  . Nitroglycerin Other (See Comments)    Unknown - [facility]  . Propranolol Diarrhea    Chest pain  . Septra [Sulfamethoxazole-Trimethoprim]     Rash, cyst  . Patrici Ranks Fumarate] Other (See Comments)    Unknown reaction  . Valturna [Aliskiren-Valsartan] Other (See Comments)    Unknown reaction    Family History  Problem Relation Age of Onset  . Diabetes Father   . Diabetes Other     5/8 sibs    BP 114/56 mmHg  Pulse 88  Temp(Src) 98.2 F (36.8 C) (Oral)  Ht 5\' 5"  (1.651 m)  Wt 145 lb (65.772 kg)  BMI 24.13 kg/m2  SpO2 98%    Review of Systems Denies LOC, hypoglycemia, and weight change.      Objective:   Physical Exam VITAL SIGNS:  See vs page.  GENERAL: no distress. Head: 4x1 cm ulcer at the right temporal area.   Pulses: dorsalis pedis are absent bilaterally.  Feet:  no deformity. no ulcer on the feet. feet are of normal temp. 1+ bilat leg edema. The left calf is slightly tender. There is mild erythema of the legs. There are bilat varicosities, and rusty discoloration on the legs and feet. There is severe bilateral onychomycosis.  Neuro: sensation is intact to touch on the feet.    Lab Results  Component Value Date   HGBA1C 8.1* 10/15/2014      Assessment & Plan:  DM: worse glycemic control Posttraumatic ulcer of the scalp, worse.   Patient is advised the following: Patient Instructions  diabetes blood and urine tests are requested for you today.  We'll contact you with results.  take a basal rate of 0.8 unit/hr, 24 hrs per day.   Take a bolus of 11 units with each meal.  (if today's blood test is high, we can increase this) Take a correction bolus (which some people call "sensitivity," or "insulin sensitivity ratio," or just "isr") of 1 unit for each by 25 which your glucose exceeds 120. check your blood sugar 4 times a day--before the 3 meals, and at bedtime.  also check if you have symptoms of your blood sugar being too high or too low.  please keep a record of the readings and bring it to your next appointment here.  please call us sooner if you are having low blood sugar episodes. Please make a follow-up appointment in 3 months.   Please see a wound specialist.  you will receive a phone call, about a day and time for an appointment.     Addendum: increase supper bolus to 12 units

## 2014-11-02 DIAGNOSIS — I252 Old myocardial infarction: Secondary | ICD-10-CM | POA: Diagnosis not present

## 2014-11-02 DIAGNOSIS — I509 Heart failure, unspecified: Secondary | ICD-10-CM | POA: Diagnosis not present

## 2014-11-02 DIAGNOSIS — M1990 Unspecified osteoarthritis, unspecified site: Secondary | ICD-10-CM | POA: Diagnosis not present

## 2014-11-02 DIAGNOSIS — I82409 Acute embolism and thrombosis of unspecified deep veins of unspecified lower extremity: Secondary | ICD-10-CM | POA: Diagnosis not present

## 2014-11-02 DIAGNOSIS — I10 Essential (primary) hypertension: Secondary | ICD-10-CM | POA: Diagnosis not present

## 2014-11-02 DIAGNOSIS — I2510 Atherosclerotic heart disease of native coronary artery without angina pectoris: Secondary | ICD-10-CM | POA: Diagnosis not present

## 2014-11-02 DIAGNOSIS — R350 Frequency of micturition: Secondary | ICD-10-CM | POA: Diagnosis not present

## 2014-11-02 DIAGNOSIS — S0100XA Unspecified open wound of scalp, initial encounter: Secondary | ICD-10-CM | POA: Diagnosis not present

## 2014-11-02 DIAGNOSIS — I498 Other specified cardiac arrhythmias: Secondary | ICD-10-CM | POA: Diagnosis not present

## 2014-11-02 DIAGNOSIS — E1059 Type 1 diabetes mellitus with other circulatory complications: Secondary | ICD-10-CM | POA: Diagnosis not present

## 2014-11-04 ENCOUNTER — Encounter: Payer: Self-pay | Admitting: Internal Medicine

## 2014-11-04 ENCOUNTER — Ambulatory Visit (INDEPENDENT_AMBULATORY_CARE_PROVIDER_SITE_OTHER): Payer: Medicare Other | Admitting: Internal Medicine

## 2014-11-04 VITALS — BP 120/64 | HR 56 | Temp 98.3°F | Resp 16 | Ht 65.0 in | Wt 145.0 lb

## 2014-11-04 DIAGNOSIS — I10 Essential (primary) hypertension: Secondary | ICD-10-CM | POA: Diagnosis not present

## 2014-11-04 DIAGNOSIS — E1059 Type 1 diabetes mellitus with other circulatory complications: Secondary | ICD-10-CM

## 2014-11-04 DIAGNOSIS — I739 Peripheral vascular disease, unspecified: Secondary | ICD-10-CM

## 2014-11-04 DIAGNOSIS — H6121 Impacted cerumen, right ear: Secondary | ICD-10-CM

## 2014-11-04 DIAGNOSIS — E1051 Type 1 diabetes mellitus with diabetic peripheral angiopathy without gangrene: Secondary | ICD-10-CM

## 2014-11-04 DIAGNOSIS — Z0000 Encounter for general adult medical examination without abnormal findings: Secondary | ICD-10-CM | POA: Diagnosis not present

## 2014-11-04 MED ORDER — OMEPRAZOLE 40 MG PO CPDR: 40.0000 mg | DELAYED_RELEASE_CAPSULE | Freq: Every day | ORAL | Status: DC

## 2014-11-04 NOTE — Progress Notes (Signed)
Subjective:    Patient ID: Emma Yu, female    DOB: 11-13-1926, 81 y.o.   MRN: [id number]  HPI Comments: She requests a complete physical. She also complains of LOH on the right.  Diabetes She presents for her follow-up diabetic visit. She has type 2 diabetes mellitus. Her disease course has been stable. There are no hypoglycemic associated symptoms. Pertinent negatives for hypoglycemia include no pallor. Associated symptoms include foot paresthesias. Pertinent negatives for diabetes include no blurred vision, no chest pain, no fatigue, no foot ulcerations, no polydipsia, no polyphagia, no polyuria, no visual change, no weakness and no weight loss. There are no hypoglycemic complications. Diabetic complications include heart disease. Current diabetic treatment includes insulin injections and intensive insulin program. [name] compliant with treatment most of the time. She never participates in exercise. There is no change in her home blood glucose trend. An ACE inhibitor/angiotensin II receptor blocker is being taken. Eye exam is current.      Review of Systems  Constitutional: Negative.  Negative for chills, weight loss, diaphoresis, appetite change and fatigue.  HENT: Positive for hearing loss. Negative for congestion, ear pain, facial swelling, nosebleeds, postnasal drip, rhinorrhea, sinus pressure, sneezing, sore throat, tinnitus, trouble swallowing and voice change.   Eyes: Negative.  Negative for blurred vision.  Respiratory: Negative.  Negative for cough, choking, chest tightness, shortness of breath and stridor.   Cardiovascular: Negative.  Negative for chest pain, palpitations and leg swelling.  Gastrointestinal: Negative.  Negative for vomiting, abdominal pain, diarrhea and constipation.  Endocrine: Negative.  Negative for polydipsia, polyphagia and polyuria.  Genitourinary: Negative.   Musculoskeletal: Positive for back pain and arthralgias. Negative for myalgias, joint  swelling, neck pain and neck stiffness.  Skin: Negative.  Negative for pallor and rash.  Allergic/Immunologic: Negative.   Neurological: Negative.  Negative for weakness.  Hematological: Negative.  Negative for adenopathy. Does not bruise/bleed easily.  Psychiatric/Behavioral: Negative.        Objective:   Physical Exam  Constitutional: [name] oriented to person, place, and time. She appears well-developed and well-nourished. No distress.  HENT:  Head: Normocephalic and atraumatic.  Right Ear: Hearing, tympanic membrane and external ear normal. A foreign body is present.  Left Ear: Hearing, tympanic membrane, external ear and ear canal normal.  Mouth/Throat: Oropharynx is clear and moist. No oropharyngeal exudate.  Rt EAC is filled with cerumen, I put colace in the EAC and then irrigated it with water and used an ear pick to remove the wax, this was successful, afterwards the EAC appears normal with no injury, bleeding, swelling. She tolerated this well.  Eyes: Conjunctivae are normal. Right eye exhibits no discharge. Left eye exhibits no discharge. No scleral icterus.  Neck: Normal range of motion. Neck supple. No JVD present. No tracheal deviation present. No thyromegaly present.  Cardiovascular: Normal rate, regular rhythm, S1 normal, S2 normal and intact distal pulses.  Exam reveals no gallop and no friction rub.   Murmur heard.  Decrescendo systolic murmur is present with a grade of 1/6   No diastolic murmur is present  Pulmonary/Chest: Effort normal and breath sounds normal. No stridor. No respiratory distress. She has no decreased breath sounds. She has no wheezes. She has no rales. She exhibits no tenderness.  Breast exam was refused by her  Abdominal: Soft. Bowel sounds are normal. She exhibits no distension and no mass. There is no tenderness. There is no rebound and no guarding.  Genitourinary:  GU/rectal exams were refused  by her  Musculoskeletal: Normal range of motion. She  exhibits no edema or tenderness.  Lymphadenopathy:    She has no cervical adenopathy.  Neurological: [name] oriented to person, place, and time.  Skin: Skin is warm and dry. No rash noted. [name] not diaphoretic. No erythema. No pallor.  Psychiatric: She has a normal mood and affect. Her behavior is normal. Judgment and thought content normal.  Vitals reviewed.    Lab Results  Component Value Date   WBC 8.1 07/04/2014   HGB 11.7* 07/04/2014   HCT 39.2 07/04/2014   PLT 224 07/04/2014   GLUCOSE 163* 10/14/2014   CHOL 109 12/21/2013   TRIG 146.0 12/21/2013   HDL 50.40 12/21/2013   LDLCALC 29 12/21/2013   ALT 23 06/27/2014   AST 28 06/27/2014   NA 143 10/14/2014   K 4.8 10/14/2014   CL 102 10/14/2014   CREATININE 1.49* 10/14/2014   BUN 55* 10/14/2014   CO2 29 10/14/2014   TSH 0.805 06/28/2014   INR 1.18 12/25/2011   HGBA1C 8.1* 10/15/2014   MICROALBUR 1.2 10/15/2014       Assessment & Plan:

## 2014-11-04 NOTE — Patient Instructions (Signed)

## 2014-11-08 ENCOUNTER — Encounter (HOSPITAL_BASED_OUTPATIENT_CLINIC_OR_DEPARTMENT_OTHER): Payer: Medicare Other

## 2014-11-08 DIAGNOSIS — H6120 Impacted cerumen, unspecified ear: Secondary | ICD-10-CM | POA: Insufficient documentation

## 2014-11-08 DIAGNOSIS — Z0000 Encounter for general adult medical examination without abnormal findings: Secondary | ICD-10-CM | POA: Insufficient documentation

## 2014-11-08 DIAGNOSIS — E109 Type 1 diabetes mellitus without complications: Secondary | ICD-10-CM | POA: Diagnosis not present

## 2014-11-08 NOTE — Assessment & Plan Note (Signed)
Recent A1C shows good control of the blood sugars

## 2014-11-08 NOTE — Assessment & Plan Note (Signed)
Was was successfully removed

## 2014-11-08 NOTE — Assessment & Plan Note (Signed)
Her BP is well controlled Recent lytes and renal function were normal 

## 2014-11-08 NOTE — Assessment & Plan Note (Signed)

## 2014-11-09 DIAGNOSIS — X35XXXA Volcanic eruption, initial encounter: Secondary | ICD-10-CM | POA: Diagnosis not present

## 2014-11-09 DIAGNOSIS — S0100XD Unspecified open wound of scalp, subsequent encounter: Secondary | ICD-10-CM | POA: Diagnosis not present

## 2014-11-09 DIAGNOSIS — E1059 Type 1 diabetes mellitus with other circulatory complications: Secondary | ICD-10-CM | POA: Diagnosis not present

## 2014-11-16 DIAGNOSIS — S0100XA Unspecified open wound of scalp, initial encounter: Secondary | ICD-10-CM | POA: Diagnosis not present

## 2014-11-16 DIAGNOSIS — S0100XD Unspecified open wound of scalp, subsequent encounter: Secondary | ICD-10-CM | POA: Diagnosis not present

## 2014-11-16 DIAGNOSIS — E1059 Type 1 diabetes mellitus with other circulatory complications: Secondary | ICD-10-CM | POA: Diagnosis not present

## 2014-11-17 DIAGNOSIS — S0100XA Unspecified open wound of scalp, initial encounter: Secondary | ICD-10-CM | POA: Diagnosis not present

## 2014-11-18 ENCOUNTER — Other Ambulatory Visit: Payer: Self-pay | Admitting: Endocrinology

## 2014-11-19 ENCOUNTER — Other Ambulatory Visit: Payer: Self-pay

## 2014-11-23 DIAGNOSIS — E1059 Type 1 diabetes mellitus with other circulatory complications: Secondary | ICD-10-CM | POA: Diagnosis not present

## 2014-11-23 DIAGNOSIS — S0100XA Unspecified open wound of scalp, initial encounter: Secondary | ICD-10-CM | POA: Diagnosis not present

## 2014-11-23 DIAGNOSIS — S0100XD Unspecified open wound of scalp, subsequent encounter: Secondary | ICD-10-CM | POA: Diagnosis not present

## 2014-11-25 ENCOUNTER — Other Ambulatory Visit (HOSPITAL_COMMUNITY): Payer: Self-pay | Admitting: Adult Health

## 2014-11-26 ENCOUNTER — Ambulatory Visit: Payer: Medicare Other

## 2014-11-30 DIAGNOSIS — S0100XA Unspecified open wound of scalp, initial encounter: Secondary | ICD-10-CM | POA: Diagnosis not present

## 2014-11-30 DIAGNOSIS — E1059 Type 1 diabetes mellitus with other circulatory complications: Secondary | ICD-10-CM | POA: Diagnosis not present

## 2014-11-30 DIAGNOSIS — S0100XD Unspecified open wound of scalp, subsequent encounter: Secondary | ICD-10-CM | POA: Diagnosis not present

## 2014-12-04 ENCOUNTER — Other Ambulatory Visit (HOSPITAL_COMMUNITY): Payer: Self-pay | Admitting: Cardiology

## 2014-12-07 DIAGNOSIS — Z794 Long term (current) use of insulin: Secondary | ICD-10-CM | POA: Diagnosis not present

## 2014-12-07 DIAGNOSIS — I499 Cardiac arrhythmia, unspecified: Secondary | ICD-10-CM | POA: Diagnosis not present

## 2014-12-07 DIAGNOSIS — M1990 Unspecified osteoarthritis, unspecified site: Secondary | ICD-10-CM | POA: Diagnosis not present

## 2014-12-07 DIAGNOSIS — I82409 Acute embolism and thrombosis of unspecified deep veins of unspecified lower extremity: Secondary | ICD-10-CM | POA: Diagnosis not present

## 2014-12-07 DIAGNOSIS — I509 Heart failure, unspecified: Secondary | ICD-10-CM | POA: Diagnosis not present

## 2014-12-07 DIAGNOSIS — S0100XA Unspecified open wound of scalp, initial encounter: Secondary | ICD-10-CM | POA: Diagnosis not present

## 2014-12-07 DIAGNOSIS — S0100XD Unspecified open wound of scalp, subsequent encounter: Secondary | ICD-10-CM | POA: Diagnosis not present

## 2014-12-07 DIAGNOSIS — Z87891 Personal history of nicotine dependence: Secondary | ICD-10-CM | POA: Diagnosis not present

## 2014-12-07 DIAGNOSIS — E1059 Type 1 diabetes mellitus with other circulatory complications: Secondary | ICD-10-CM | POA: Diagnosis not present

## 2014-12-07 DIAGNOSIS — I2510 Atherosclerotic heart disease of native coronary artery without angina pectoris: Secondary | ICD-10-CM | POA: Diagnosis not present

## 2014-12-07 DIAGNOSIS — I252 Old myocardial infarction: Secondary | ICD-10-CM | POA: Diagnosis not present

## 2014-12-09 ENCOUNTER — Telehealth: Payer: Self-pay | Admitting: Cardiovascular Disease

## 2014-12-09 NOTE — Telephone Encounter (Signed)
Attempted to contact the pt x 2 and phone line is busy.  Triage will continue to follow-up.

## 2014-12-09 NOTE — Telephone Encounter (Signed)
Pt c/o swelling: STAT is pt has developed SOB within 24 hours  1. How long have you been experiencing swelling? No swelling  2. Where is the swelling located? No swelling   3.  Are you currently taking a "fluid pill"? Lasix   4.  Are you currently SOB? No Symptoms. The only thing she is up 5 lbs within 24 Hours   5.  Have you traveled recently? No  Stephanie the Nurse with [employer]/ Case manager will send the pt's wt readings for the past two weeks through fax.

## 2014-12-10 NOTE — Telephone Encounter (Signed)
Colletta Maryland w/[employer] CHF program calling stating Ms. Burrows wt is up 5 lbs over past 24 hr. She also had Ms. Boule on the phone.  States no c/o of SOB, edema.  Only symptom is tightness in abdomen. BP is 135/64 HR 60. Is taking Lasix 60 mg daily.  Pt states she feels fine. Discussed diet restrictions with sodium.  States she is very aware to reduce sodium intake. Reviewed w/Rhonda Barrett,PA/flex who suggests that she take a one time extra dose of Lasix 60 mg today.  Advised pt to take the extra dose early afternoon.  If tightness in abdomen does not resolve or if she becomes SOB or wt does not decrease to call MD on call over the weekend. Otherwise advised to call on Monday if tightness in abd not resolved or has other symptoms.  She verbalizes understanding and is agreeable with plan.

## 2014-12-13 DIAGNOSIS — E109 Type 1 diabetes mellitus without complications: Secondary | ICD-10-CM | POA: Diagnosis not present

## 2014-12-14 ENCOUNTER — Telehealth (HOSPITAL_COMMUNITY): Payer: Self-pay | Admitting: *Deleted

## 2014-12-14 DIAGNOSIS — S0100XD Unspecified open wound of scalp, subsequent encounter: Secondary | ICD-10-CM | POA: Diagnosis not present

## 2014-12-14 DIAGNOSIS — E1059 Type 1 diabetes mellitus with other circulatory complications: Secondary | ICD-10-CM | POA: Diagnosis not present

## 2014-12-14 DIAGNOSIS — S0100XA Unspecified open wound of scalp, initial encounter: Secondary | ICD-10-CM | POA: Diagnosis not present

## 2014-12-14 NOTE — Telephone Encounter (Signed)
Pt and her neighbor called concerned about a $10 bill from [facility] on 07/20/14. Pt said she never had blood work done it looks like she had a bmet drawn and it was sent [facility].  I told her we don't do any billing in office here pts neighbor thinks [facility] might have drawn the blood.  I advised her to contact [facility] or [facility].

## 2014-12-21 DIAGNOSIS — S0100XA Unspecified open wound of scalp, initial encounter: Secondary | ICD-10-CM | POA: Diagnosis not present

## 2014-12-21 DIAGNOSIS — S0100XD Unspecified open wound of scalp, subsequent encounter: Secondary | ICD-10-CM | POA: Diagnosis not present

## 2014-12-21 DIAGNOSIS — E1059 Type 1 diabetes mellitus with other circulatory complications: Secondary | ICD-10-CM | POA: Diagnosis not present

## 2014-12-28 DIAGNOSIS — S0100XA Unspecified open wound of scalp, initial encounter: Secondary | ICD-10-CM | POA: Diagnosis not present

## 2014-12-28 DIAGNOSIS — S0100XD Unspecified open wound of scalp, subsequent encounter: Secondary | ICD-10-CM | POA: Diagnosis not present

## 2014-12-28 DIAGNOSIS — E1059 Type 1 diabetes mellitus with other circulatory complications: Secondary | ICD-10-CM | POA: Diagnosis not present

## 2014-12-31 ENCOUNTER — Telehealth: Payer: Self-pay | Admitting: Cardiovascular Disease

## 2014-12-31 DIAGNOSIS — I6523 Occlusion and stenosis of bilateral carotid arteries: Secondary | ICD-10-CM

## 2014-12-31 NOTE — Telephone Encounter (Signed)
Does not need abdominal or AAA scan

## 2014-12-31 NOTE — Telephone Encounter (Signed)
PT  DUE FOR  CAROTID  IN MAY  NOT  SURE  IF  AAA DUPLEX NEEDED  WILL FORWARD TO DR Johnsie Cancel FOR  REVIEW .Adonis Housekeeper

## 2014-12-31 NOTE — Telephone Encounter (Signed)
LMTCB ./CY 

## 2014-12-31 NOTE — Telephone Encounter (Signed)
New Message  Pt son called to clarify two letters that pt received: 1) a recall for AAA Duplex and 2) recall carotid ultrasound. There is only a recall in the system for the AAA Duplex. Pt son said that they were both sent the same day but arrived separately. Pt son wanted to clarify which tests are necessary and i f both, would like on same day.  Please call back and discuss.

## 2015-01-04 DIAGNOSIS — S0100XA Unspecified open wound of scalp, initial encounter: Secondary | ICD-10-CM | POA: Diagnosis not present

## 2015-01-04 DIAGNOSIS — E1059 Type 1 diabetes mellitus with other circulatory complications: Secondary | ICD-10-CM | POA: Diagnosis not present

## 2015-01-04 DIAGNOSIS — S0100XD Unspecified open wound of scalp, subsequent encounter: Secondary | ICD-10-CM | POA: Diagnosis not present

## 2015-01-05 ENCOUNTER — Other Ambulatory Visit: Payer: Self-pay | Admitting: Cardiovascular Disease

## 2015-01-05 DIAGNOSIS — I714 Abdominal aortic aneurysm, without rupture, unspecified: Secondary | ICD-10-CM

## 2015-01-05 NOTE — Telephone Encounter (Signed)
LM TO CALL BACK ./CY 

## 2015-01-06 NOTE — Telephone Encounter (Signed)
LM TO CALL BACK ./CY 

## 2015-01-06 NOTE — Telephone Encounter (Signed)
PT' SON AWARE .Adonis Housekeeper

## 2015-01-07 ENCOUNTER — Telehealth: Payer: Self-pay

## 2015-01-07 NOTE — Telephone Encounter (Signed)
Received refill request from [facility] requesting refills for tramadol 50 mg . Rx last written 06/28/14 and pt last seen 11/04/14. Please advise Thanks

## 2015-01-11 DIAGNOSIS — S0100XD Unspecified open wound of scalp, subsequent encounter: Secondary | ICD-10-CM | POA: Diagnosis not present

## 2015-01-11 DIAGNOSIS — S0100XA Unspecified open wound of scalp, initial encounter: Secondary | ICD-10-CM | POA: Diagnosis not present

## 2015-01-11 DIAGNOSIS — E1059 Type 1 diabetes mellitus with other circulatory complications: Secondary | ICD-10-CM | POA: Diagnosis not present

## 2015-01-12 ENCOUNTER — Ambulatory Visit (HOSPITAL_COMMUNITY): Payer: Medicare Other | Attending: Cardiology

## 2015-01-12 ENCOUNTER — Other Ambulatory Visit (HOSPITAL_COMMUNITY): Payer: Medicare Other

## 2015-01-12 DIAGNOSIS — I6523 Occlusion and stenosis of bilateral carotid arteries: Secondary | ICD-10-CM | POA: Diagnosis not present

## 2015-01-13 ENCOUNTER — Ambulatory Visit: Payer: Medicare Other | Admitting: Endocrinology

## 2015-01-13 ENCOUNTER — Ambulatory Visit: Payer: Medicare Other

## 2015-01-18 ENCOUNTER — Other Ambulatory Visit: Payer: Self-pay

## 2015-01-18 DIAGNOSIS — E109 Type 1 diabetes mellitus without complications: Secondary | ICD-10-CM | POA: Diagnosis not present

## 2015-01-18 MED ORDER — TRAMADOL HCL 50 MG PO TABS: 100.0000 mg | ORAL_TABLET | Freq: Four times a day (QID) | ORAL | Status: DC | PRN

## 2015-01-18 NOTE — Telephone Encounter (Signed)
done

## 2015-01-18 NOTE — Addendum Note (Signed)
Addended by: Janith Lima on: 01/18/2015 05:00 PM   Modules accepted: Orders

## 2015-01-18 NOTE — Telephone Encounter (Signed)
Pt called in about this refill?  [name] request refill to be sent to [facility] on [address]

## 2015-01-19 ENCOUNTER — Telehealth: Payer: Self-pay | Admitting: *Deleted

## 2015-01-19 NOTE — Telephone Encounter (Signed)
[place] Night - Client TELEPHONE [facility] Patient Name: KAYAL MULA Gender: Unknown DOB: 01-21-1927 Age: 81 Y 35 M 9 D Return Phone Number: [phone] (Primary) Address: City/State/Zip: [place] Client [facility] Night - Client Client Site [facility] - Night Physician Jones, Tobias Type Call Call Type Triage / Clinical Relationship To Patient Self Return Phone Number [phone] (Primary) Chief Complaint Hip Injury Initial Comment Caller says her pharmacy has had a call to her doctor for [facility] for over a week for her pain on her hip an back but [facility] not [facility]. [name] totally out andis in pain. Had a heart failure in 2013. Nurse Assessment Nurse: Margaretmary Dys, RN, Hettie Date/Time (Eastern Time): 01/15/2015 4:07:59 PM Confirm and document reason for call. If symptomatic, describe symptoms. ---Caller says her pharmacy has had a call to her doctor for Tramadol for over a week for her pain on her hip an back but [facility] not [facility]. [name] totally out and is in pain. Had a heart failure in 2013. Advised pt unable to call in controlled substances. Pt verbalized understanding. Has the patient traveled out of the country within the last 30 days? ---Not Applicable Does the patient require triage? ---Declined Triage Guidelines Guideline Title Affirmed Question Affirmed Notes Nurse Date/Time ([place] Time) Disp. Time Eilene Ghazi Time) Disposition Final User 01/15/2015 4:09:52 PM Clinical Call Yes Margaretmary Dys, RN, Hettie After Care Instructions Given Call Event Type User Date / Time Description

## 2015-01-20 DIAGNOSIS — C4441 Basal cell carcinoma of skin of scalp and neck: Secondary | ICD-10-CM | POA: Diagnosis not present

## 2015-01-20 DIAGNOSIS — L578 Other skin changes due to chronic exposure to nonionizing radiation: Secondary | ICD-10-CM | POA: Diagnosis not present

## 2015-01-26 DIAGNOSIS — C4441 Basal cell carcinoma of skin of scalp and neck: Secondary | ICD-10-CM | POA: Diagnosis not present

## 2015-02-11 DIAGNOSIS — E109 Type 1 diabetes mellitus without complications: Secondary | ICD-10-CM | POA: Diagnosis not present

## 2015-03-08 ENCOUNTER — Encounter: Payer: Self-pay | Admitting: Endocrinology

## 2015-03-08 ENCOUNTER — Ambulatory Visit (INDEPENDENT_AMBULATORY_CARE_PROVIDER_SITE_OTHER): Payer: Medicare Other | Admitting: Endocrinology

## 2015-03-08 VITALS — BP 128/80 | HR 64 | Temp 98.7°F | Resp 16 | Ht 64.0 in | Wt 142.0 lb

## 2015-03-08 DIAGNOSIS — E1059 Type 1 diabetes mellitus with other circulatory complications: Secondary | ICD-10-CM

## 2015-03-08 DIAGNOSIS — E1051 Type 1 diabetes mellitus with diabetic peripheral angiopathy without gangrene: Secondary | ICD-10-CM

## 2015-03-08 DIAGNOSIS — I739 Peripheral vascular disease, unspecified: Secondary | ICD-10-CM | POA: Diagnosis not present

## 2015-03-08 LAB — POCT GLYCOSYLATED HEMOGLOBIN (HGB A1C): Hemoglobin A1C: 7.3

## 2015-03-08 NOTE — Progress Notes (Signed)
Subjective:    Patient ID: Emma Yu, female    DOB: [birth year], 81 y.o.   MRN: [id number]  HPI Pt returns for f/u of diabetes mellitus: DM type: 1 Dx'ed: 8101 Complications: CAD, nephropathy, and PAD Therapy: insulin since 2001 GDM: never DKA: never Severe hypoglycemia: never Pancreatitis: never Other: she has a one-touch ping insulin pump; she has declined continuous glucose monitor Interval history: She averages a total of approx 60 units of humalog per day, via her pump.  She uses these settings: basal rate of 0.8 unit/hr, 24 hrs per day.   bolus of 10 units with each meal.   correction bolus (which some people call "sensitivity," or "insulin sensitivity ratio," or just "isr") of 1 unit for each by 25 which your glucose exceeds 120.  she brings a record of her cbg's which i have reviewed today.  It varies from 84-250, but most are in the low to mid-100's.  husband says pt feels well in general. Past Medical History  Diagnosis Date  . PAD (peripheral artery disease)     s/p bilateral comon iliac artery stenting in 2002. Known significant  R SFA  disease. carotid dz noted 11/2011 followed by Dr. Bridgett Larsson  . Diabetes mellitus   . Hypertension   . Hyperlipidemia   . Venous insufficiency   . Chronic diastolic heart failure     Echocardiogram 12/10/11: Moderate LVH, EF 65-70%, moderate aortic stenosis, mean gradient 20, mild MS  . Atrial fibrillation     not a coumadin candidate secondary to fall risk  . DJD (degenerative joint disease) of hip     s/p R THR 10/2010  . Aortic stenosis     Moderate by echo 4/13 - mean 20 mmHg  . Mitral stenosis     Mild by echo 4/13  . Anemia     iron deficient  . Acute respiratory failure     12/2011 admission - decreased O2 sats on ambulation, improved by time of discharge  . CAD (coronary artery disease)     minimal plaque by cath 5/12  . CHF (congestive heart failure)     EF 55-60%    Past Surgical History  Procedure Laterality  Date  . Total hip arthroplasty  1991, 1994    redo in 1994  . Lumbar disc surgery  1999  . Cardiac catheterization  01/10/2011    No significant CAD  . Cholecystectomy    . Abdominal hysterectomy    . Cataract extraction  2015    History   Social History  . Marital Status: Widowed    Spouse Name: N/A  . Number of Children: N/A  . Years of Education: N/A   Occupational History  .      retired   Social History Main Topics  . Smoking status: Former Smoker -- 2.00 packs/day for 25 years    Types: Cigarettes    Quit date: 08/20/1985  . Smokeless tobacco: Never Used     Comment: Retired-widowed 1, lives with son  . Alcohol Use: No  . Drug Use: No  . Sexual Activity: Not Currently    Birth Control/ Protection: Surgical   Other Topics Concern  . Not on file   Social History Narrative   Lives with son   Widowed 66   Quit smoking in 1987    Current Outpatient Prescriptions on File Prior to Visit  Medication Sig Dispense Refill  . Ascorbic Acid (VITAMIN C) 500 MG tablet Take 500 mg by  mouth daily.      Marland Kitchen aspirin EC 81 MG tablet Take 81 mg by mouth 2 (two) times daily.    Marland Kitchen b complex vitamins tablet Take 1 tablet by mouth daily.      . Cholecalciferol (VITAMIN D3) 2000 UNITS capsule Take 2,000 Units by mouth daily.      . fish oil-omega-3 fatty acids 1000 MG capsule Take 2 g by mouth 2 (two) times daily.     . furosemide (LASIX) 20 MG tablet Take 3 tablets (60 mg total) by mouth daily. 90 tablet 3  . furosemide (LASIX) 20 MG tablet Take 3 tablets (60 mg total) by mouth daily. 90 tablet 3  . gabapentin (NEURONTIN) 300 MG capsule Take 600 mg by mouth at bedtime.    Marland Kitchen HUMALOG 100 UNIT/ML injection Inject via insulin pump for a total of 70 units per day 70 mL 1  . Insulin Infusion Pump Supplies (INSET INFUSION SET 23" 6MM) MISC 1 Device by Does not apply route every 3 (three) days.    . Insulin Infusion Pump Supplies (INSULIN PUMP SYRINGE RESERVOIR) MISC 1 Device by Does  not apply route every 3 (three) days. Animas 2 ml    . metoprolol (LOPRESSOR) 100 MG tablet TAKE 1 TABLET (100 MG TOTAL) BY MOUTH 2 (TWO) TIMES DAILY. 60 tablet 3  . omeprazole (PRILOSEC) 40 MG capsule Take 1 capsule (40 mg total) by mouth daily before breakfast. 90 capsule 3  . pravastatin (PRAVACHOL) 20 MG tablet Take 1 tablet (20 mg total) by mouth daily. TAKE 1 TABLET BY MOUTH IN THE EVENING 30 tablet 11  . Probiotic Product (PROBIOTIC PO) Take 1 tablet by mouth daily at 10 pm.    . traMADol (ULTRAM) 50 MG tablet Take 2 tablets (100 mg total) by mouth every 6 (six) hours as needed. for pain 120 tablet 3  . valsartan (DIOVAN) 320 MG tablet Take 320 mg by mouth daily.     No current facility-administered medications on file prior to visit.    Allergies  Allergen Reactions  . Carvedilol Other (See Comments)    Heart stops  . Clindamycin/Lincomycin Other (See Comments)    tremors  . Diltiazem Hcl Other (See Comments)    Chest pain;   07/02/14-  Patient has been able to tolerate diltiazem PO as inpatient since 06/29/14 without any adverse reaction.  . Fenofibrate Other (See Comments)    Unknown - [facility] - MAR  . Hctz [Hydrochlorothiazide] Other (See Comments)    Chest pain  . Nisoldipine Other (See Comments)    Chest pain  . Nitroglycerin Other (See Comments)    Unknown - [facility]  . Propranolol Diarrhea    Chest pain  . Septra [Sulfamethoxazole-Trimethoprim]     Rash, cyst  . Patrici Ranks Fumarate] Other (See Comments)    Unknown reaction  . Valturna [Aliskiren-Valsartan] Other (See Comments)    Unknown reaction    Family History  Problem Relation Age of Onset  . Diabetes Father   . Diabetes Other     5/8 sibs    BP 128/80 mmHg  Pulse 64  Temp(Src) 98.7 F (37.1 C) (Oral)  Resp 16  Ht 5\' 4"  (1.626 m)  Wt 142 lb (64.411 kg)  BMI 24.36 kg/m2  SpO2 94%  Review of Systems She denies hypoglycemia    Objective:   Physical Exam VITAL SIGNS:  See vs page GENERAL:  no distress Pulses: dorsalis pedis are absent bilaterally.  Feet: no deformity. no  ulcer on the feet. feet are of normal temp. 1+ bilat leg edema. There is mild (chronic) erythema of the legs. There are bilat varicosities, and rusty discoloration on the legs and feet. There is severe bilateral onychomycosis.  Neuro: sensation is intact to touch on the feet.   A1c=7.3%    Assessment & Plan:  DM: this is the best control this pt should aim for, given her variable cbg's.  Risks of further lowering of a1c exceeds benefit.  Pt and husband say they agree with this plan.  Patient is advised the following: Patient Instructions  Please continue: basal rate of 0.8 unit/hr, 24 hrs per day.   bolus of 10 units with each meal.  (if today's blood test is high, we can increase this) correction bolus (which some people call "sensitivity," or "insulin sensitivity ratio," or just "isr") of 1 unit for each by 25 which your glucose exceeds 120. check your blood sugar 4 times a day--before the 3 meals, and at bedtime.  also check if you have symptoms of your blood sugar being too high or too low.  please keep a record of the readings and bring it to your next appointment here.  please call us sooner if you are having low blood sugar episodes. Please make a follow-up appointment in 3 months.

## 2015-03-08 NOTE — Patient Instructions (Addendum)
Please continue: basal rate of 0.8 unit/hr, 24 hrs per day.   bolus of 10 units with each meal.  (if today's blood test is high, we can increase this) correction bolus (which some people call "sensitivity," or "insulin sensitivity ratio," or just "isr") of 1 unit for each by 25 which your glucose exceeds 120. check your blood sugar 4 times a day--before the 3 meals, and at bedtime.  also check if you have symptoms of your blood sugar being too high or too low.  please keep a record of the readings and bring it to your next appointment here.  please call us sooner if you are having low blood sugar episodes. Please make a follow-up appointment in 3 months.

## 2015-03-10 ENCOUNTER — Other Ambulatory Visit (INDEPENDENT_AMBULATORY_CARE_PROVIDER_SITE_OTHER): Payer: Medicare Other

## 2015-03-10 ENCOUNTER — Ambulatory Visit (INDEPENDENT_AMBULATORY_CARE_PROVIDER_SITE_OTHER): Payer: Medicare Other | Admitting: Internal Medicine

## 2015-03-10 ENCOUNTER — Encounter: Payer: Self-pay | Admitting: Internal Medicine

## 2015-03-10 VITALS — BP 128/70 | HR 63 | Temp 98.3°F | Resp 16 | Ht 64.0 in | Wt 140.0 lb

## 2015-03-10 DIAGNOSIS — E1059 Type 1 diabetes mellitus with other circulatory complications: Secondary | ICD-10-CM | POA: Diagnosis not present

## 2015-03-10 DIAGNOSIS — I739 Peripheral vascular disease, unspecified: Secondary | ICD-10-CM | POA: Diagnosis not present

## 2015-03-10 DIAGNOSIS — E1051 Type 1 diabetes mellitus with diabetic peripheral angiopathy without gangrene: Secondary | ICD-10-CM

## 2015-03-10 DIAGNOSIS — D510 Vitamin B12 deficiency anemia due to intrinsic factor deficiency: Secondary | ICD-10-CM

## 2015-03-10 DIAGNOSIS — I10 Essential (primary) hypertension: Secondary | ICD-10-CM

## 2015-03-10 DIAGNOSIS — E785 Hyperlipidemia, unspecified: Secondary | ICD-10-CM | POA: Diagnosis not present

## 2015-03-10 LAB — CBC WITH DIFFERENTIAL/PLATELET
Basophils Absolute: 0 10*3/uL (ref 0.0–0.1)
Basophils Relative: 0.2 % (ref 0.0–3.0)
Eosinophils Absolute: 0.1 10*3/uL (ref 0.0–0.7)
Eosinophils Relative: 1.9 % (ref 0.0–5.0)
HCT: 43.4 % (ref 36.0–46.0)
Hemoglobin: 14.4 g/dL (ref 12.0–15.0)
LYMPHS PCT: 28.2 % (ref 12.0–46.0)
Lymphs Abs: 1.9 10*3/uL (ref 0.7–4.0)
MCHC: 33.2 g/dL (ref 30.0–36.0)
MCV: 93.8 fl (ref 78.0–100.0)
Monocytes Absolute: 0.7 10*3/uL (ref 0.1–1.0)
Monocytes Relative: 10.6 % (ref 3.0–12.0)
NEUTROS PCT: 59.1 % (ref 43.0–77.0)
Neutro Abs: 4 10*3/uL (ref 1.4–7.7)
PLATELETS: 171 10*3/uL (ref 150.0–400.0)
RBC: 4.63 Mil/uL (ref 3.87–5.11)
RDW: 18.1 % — ABNORMAL HIGH (ref 11.5–15.5)
WBC: 6.8 10*3/uL (ref 4.0–10.5)

## 2015-03-10 LAB — LIPID PANEL
CHOLESTEROL: 133 mg/dL (ref 0–200)
HDL: 54.2 mg/dL (ref >39.00)
LDL Cholesterol: 42 mg/dL (ref 0–99)
NonHDL: 78.8
Total CHOL/HDL Ratio: 2
Triglycerides: 186 mg/dL — ABNORMAL HIGH (ref 0.0–149.0)
VLDL: 37.2 mg/dL (ref 0.0–40.0)

## 2015-03-10 LAB — BASIC METABOLIC PANEL
BUN: 54 mg/dL — AB (ref 6–23)
CALCIUM: 11 mg/dL — AB (ref 8.4–10.5)
CO2: 36 mEq/L — ABNORMAL HIGH (ref 19–32)
CREATININE: 1.44 mg/dL — AB (ref 0.40–1.20)
Chloride: 100 mEq/L (ref 96–112)
GFR: 36.7 mL/min — AB (ref >60.00)
Glucose, Bld: 95 mg/dL (ref 70–99)
Potassium: 5.2 mEq/L — ABNORMAL HIGH (ref 3.5–5.1)
Sodium: 140 mEq/L (ref 135–145)

## 2015-03-10 LAB — HEMOGLOBIN A1C: Hgb A1c MFr Bld: 7.5 % — ABNORMAL HIGH (ref 4.6–6.5)

## 2015-03-10 MED ORDER — OMEPRAZOLE 40 MG PO CPDR: 40.0000 mg | DELAYED_RELEASE_CAPSULE | Freq: Every day | ORAL | Status: DC

## 2015-03-10 NOTE — Progress Notes (Signed)
Subjective:  Patient ID: Emma Yu, female    DOB: 08-27-1926  Age: 81 y.o. MRN: [id number]  CC: Hypertension; Hyperlipidemia; and Diabetes   HPI Emma Yu presents for follow-up on hypertension, hypercholesterolemia and diabetes. She offers no new complaints today. Since I last saw her she has had a basal cell carcinoma removed from her right upper scalp. The wound is healing slowly but she is pleased with the progress at this time.  Outpatient Prescriptions Prior to Visit  Medication Sig Dispense Refill  . Ascorbic Acid (VITAMIN C) 500 MG tablet Take 500 mg by mouth daily.      Marland Kitchen aspirin EC 81 MG tablet Take 81 mg by mouth 2 (two) times daily.    Marland Kitchen b complex vitamins tablet Take 1 tablet by mouth daily.      . Cholecalciferol (VITAMIN D3) 2000 UNITS capsule Take 2,000 Units by mouth daily.      . fish oil-omega-3 fatty acids 1000 MG capsule Take 2 g by mouth 2 (two) times daily.     . furosemide (LASIX) 20 MG tablet Take 3 tablets (60 mg total) by mouth daily. 90 tablet 3  . gabapentin (NEURONTIN) 300 MG capsule Take 600 mg by mouth at bedtime.    Marland Kitchen HUMALOG 100 UNIT/ML injection Inject via insulin pump for a total of 70 units per day 70 mL 1  . Insulin Infusion Pump Supplies (INSET INFUSION SET 23" 6MM) MISC 1 Device by Does not apply route every 3 (three) days.    . Insulin Infusion Pump Supplies (INSULIN PUMP SYRINGE RESERVOIR) MISC 1 Device by Does not apply route every 3 (three) days. Animas 2 ml    . metoprolol (LOPRESSOR) 100 MG tablet TAKE 1 TABLET (100 MG TOTAL) BY MOUTH 2 (TWO) TIMES DAILY. 60 tablet 3  . pravastatin (PRAVACHOL) 20 MG tablet Take 1 tablet (20 mg total) by mouth daily. TAKE 1 TABLET BY MOUTH IN THE EVENING 30 tablet 11  . Probiotic Product (PROBIOTIC PO) Take 1 tablet by mouth daily at 10 pm.    . traMADol (ULTRAM) 50 MG tablet Take 2 tablets (100 mg total) by mouth every 6 (six) hours as needed. for pain 120 tablet 3  . valsartan (DIOVAN) 320  MG tablet Take 320 mg by mouth daily.    . furosemide (LASIX) 20 MG tablet Take 3 tablets (60 mg total) by mouth daily. (Patient not taking: Reported on 03/10/2015) 90 tablet 3  . omeprazole (PRILOSEC) 40 MG capsule Take 1 capsule (40 mg total) by mouth daily before breakfast. 90 capsule 3   No facility-administered medications prior to visit.    ROS Review of Systems  Constitutional: Negative.  Negative for fever, chills, diaphoresis, appetite change and fatigue.  HENT: Negative.   Eyes: Negative.   Respiratory: Negative.  Negative for cough, choking, chest tightness, shortness of breath and stridor.   Cardiovascular: Negative.  Negative for chest pain, palpitations and leg swelling.  Gastrointestinal: Negative.  Negative for nausea, vomiting, abdominal pain, diarrhea, constipation and blood in stool.  Endocrine: Negative.  Negative for polydipsia, polyphagia and polyuria.  Genitourinary: Negative.   Musculoskeletal: Positive for arthralgias.  Skin: Negative.  Negative for rash.  Allergic/Immunologic: Negative.   Neurological: Negative.  Negative for dizziness, syncope, speech difficulty, weakness and light-headedness.  Hematological: Negative.   Psychiatric/Behavioral: Negative.     Objective:  BP 128/70 mmHg  Pulse 63  Temp(Src) 98.3 F (36.8 C) (Oral)  Resp 16  Ht 5\' 4"  (  1.626 m)  Wt 140 lb (63.504 kg)  BMI 24.02 kg/m2  SpO2 97%  BP Readings from Last 3 Encounters:  03/10/15 128/70  03/08/15 128/80  11/04/14 120/64    Wt Readings from Last 3 Encounters:  03/10/15 140 lb (63.504 kg)  03/08/15 142 lb (64.411 kg)  11/04/14 145 lb (65.772 kg)    Physical Exam  Constitutional: [name] oriented to person, place, and time. No distress.  HENT:  Mouth/Throat: Oropharynx is clear and moist. No oropharyngeal exudate.  Eyes: Conjunctivae are normal. Right eye exhibits no discharge. Left eye exhibits no discharge. No scleral icterus.  Neck: Normal range of motion. Neck  supple. No JVD present. No tracheal deviation present. No thyromegaly present.  Cardiovascular: Normal rate, regular rhythm and intact distal pulses.  Exam reveals no gallop and no friction rub.   Murmur heard. Pulmonary/Chest: Effort normal and breath sounds normal. No stridor. No respiratory distress. She has no wheezes. She has no rales. She exhibits no tenderness.  Abdominal: Soft. Bowel sounds are normal. She exhibits no distension and no mass. There is no tenderness. There is no rebound and no guarding.  Musculoskeletal: Normal range of motion. She exhibits no edema or tenderness.  Lymphadenopathy:    She has no cervical adenopathy.  Neurological: [name] oriented to person, place, and time.  Skin: Skin is warm and dry. No rash noted. [name] not diaphoretic. No erythema. No pallor.  Psychiatric: She has a normal mood and affect. Her behavior is normal. Judgment and thought content normal.  Vitals reviewed.   Lab Results  Component Value Date   WBC 6.8 03/10/2015   HGB 14.4 03/10/2015   HCT 43.4 03/10/2015   PLT 171.0 03/10/2015   GLUCOSE 95 03/10/2015   CHOL 133 03/10/2015   TRIG 186.0* 03/10/2015   HDL 54.20 03/10/2015   LDLCALC 42 03/10/2015   ALT 23 06/27/2014   AST 28 06/27/2014   NA 140 03/10/2015   K 5.2* 03/10/2015   CL 100 03/10/2015   CREATININE 1.44* 03/10/2015   BUN 54* 03/10/2015   CO2 36* 03/10/2015   TSH 0.805 06/28/2014   INR 1.18 12/25/2011   HGBA1C 7.5* 03/10/2015   MICROALBUR 1.2 10/15/2014    No results found.  Assessment & Plan:   Analyn was seen today for hypertension, hyperlipidemia and diabetes.  Diagnoses and all orders for this visit:  Diabetes mellitus type 1 with peripheral artery disease- her blood sugars are well controlled, she will continue her current insulin regimen. Orders: -     Basic metabolic panel; Future -     Hemoglobin A1c; Future  Hyperlipidemia with target LDL less than 70- her LDL is below goal and she is not  willing to take a statin. Orders: -     Lipid panel; Future  Essential hypertension, benign- her blood pressures well controlled, lites and renal function are stable. Orders: -     Basic metabolic panel; Future  Pernicious anemia- this is resolved, her hemoglobin and hematocrit are normal today. Orders: -     CBC with Differential/Platelet; Future  Other orders -     omeprazole (PRILOSEC) 40 MG capsule; Take 1 capsule (40 mg total) by mouth daily before breakfast.   I am having Ms. Benton maintain her vitamin C, Vitamin D3, b complex vitamins, fish oil-omega-3 fatty acids, aspirin EC, Probiotic Product (PROBIOTIC PO), Insulin Pump Syringe Reservoir, INSET INFUSION SET 23" 6MM, valsartan, gabapentin, pravastatin, HUMALOG, metoprolol, furosemide, traMADol, and omeprazole.  Meds ordered  this encounter  Medications  . omeprazole (PRILOSEC) 40 MG capsule    Sig: Take 1 capsule (40 mg total) by mouth daily before breakfast.    Dispense:  90 capsule    Refill:  3     Follow-up: Return in about 6 months (around 09/10/2015).  [name], MD

## 2015-03-10 NOTE — Patient Instructions (Signed)

## 2015-03-10 NOTE — Progress Notes (Signed)
Pre visit review using our clinic review tool, if applicable. No additional management support is needed unless otherwise documented below in the visit note. 

## 2015-03-11 DIAGNOSIS — E109 Type 1 diabetes mellitus without complications: Secondary | ICD-10-CM | POA: Diagnosis not present

## 2015-03-16 ENCOUNTER — Other Ambulatory Visit (HOSPITAL_COMMUNITY): Payer: Self-pay | Admitting: Internal Medicine

## 2015-03-16 DIAGNOSIS — I5022 Chronic systolic (congestive) heart failure: Secondary | ICD-10-CM

## 2015-03-23 ENCOUNTER — Other Ambulatory Visit: Payer: Self-pay

## 2015-03-23 LAB — HM DIABETES EYE EXAM

## 2015-03-23 MED ORDER — GABAPENTIN 300 MG PO CAPS: 600.0000 mg | ORAL_CAPSULE | Freq: Every day | ORAL | Status: DC

## 2015-05-03 ENCOUNTER — Other Ambulatory Visit (HOSPITAL_COMMUNITY): Payer: Self-pay | Admitting: Internal Medicine

## 2015-05-11 ENCOUNTER — Other Ambulatory Visit: Payer: Self-pay | Admitting: Endocrinology

## 2015-05-12 ENCOUNTER — Other Ambulatory Visit: Payer: Self-pay

## 2015-05-12 MED ORDER — PRAVASTATIN SODIUM 20 MG PO TABS: 20.0000 mg | ORAL_TABLET | Freq: Every day | ORAL | Status: DC

## 2015-05-20 DIAGNOSIS — E109 Type 1 diabetes mellitus without complications: Secondary | ICD-10-CM | POA: Diagnosis not present

## 2015-06-14 ENCOUNTER — Ambulatory Visit (INDEPENDENT_AMBULATORY_CARE_PROVIDER_SITE_OTHER): Payer: Medicare Other

## 2015-06-14 ENCOUNTER — Ambulatory Visit: Payer: Medicare Other | Admitting: Cardiovascular Disease

## 2015-06-14 DIAGNOSIS — Z23 Encounter for immunization: Secondary | ICD-10-CM

## 2015-07-01 ENCOUNTER — Encounter: Payer: Self-pay | Admitting: *Deleted

## 2015-07-05 ENCOUNTER — Encounter: Payer: Self-pay | Admitting: *Deleted

## 2015-07-05 ENCOUNTER — Encounter: Payer: Self-pay | Admitting: Cardiovascular Disease

## 2015-07-05 ENCOUNTER — Ambulatory Visit (INDEPENDENT_AMBULATORY_CARE_PROVIDER_SITE_OTHER): Payer: Medicare Other | Admitting: Cardiovascular Disease

## 2015-07-05 VITALS — BP 148/68 | HR 64 | Ht 64.0 in | Wt 146.1 lb

## 2015-07-05 DIAGNOSIS — I482 Chronic atrial fibrillation, unspecified: Secondary | ICD-10-CM

## 2015-07-05 DIAGNOSIS — I739 Peripheral vascular disease, unspecified: Secondary | ICD-10-CM | POA: Diagnosis not present

## 2015-07-05 DIAGNOSIS — I872 Venous insufficiency (chronic) (peripheral): Secondary | ICD-10-CM

## 2015-07-05 NOTE — Assessment & Plan Note (Signed)
Ventricular rate is controlled on metoprolol. [name] deemed to be not a good candidate for anticoagulation due to frequent falls.

## 2015-07-05 NOTE — Progress Notes (Signed)
HPI  This is an 81 year old female who is here today for a follow up visit regarding peripheral arterial disease.  She has a history of aortic stenosis, Atrial fibrillation, diastolic heart failure, peripheral arterial disease, status post bilateral common iliac artery stenting in 2002 done by Dr. Maple Hudson with known significant right SFA disease, diabetes, hypertension, hyperlipidemia. LHC 01/10/11: Luminal irregularities.   She also had a revision of right hip surgery . She walks very slowly with a walker. [name] deemed to be not an anticoagulation candidate.  She has known history of chronic venous insufficiency with significant stasis dermatitis. She reports worsening discoloration in both legs with some mild discomfort in the shin area. Her mobility continues to be limited. She has no foot ulceration.    Allergies  Allergen Reactions  . Carvedilol Other (See Comments)    Heart stops  . Clindamycin/Lincomycin Other (See Comments)    tremors  . Diltiazem Hcl Other (See Comments)    Chest pain;   07/02/14-  Patient has been able to tolerate diltiazem PO as inpatient since 06/29/14 without any adverse reaction.  . Fenofibrate Other (See Comments)    Unknown - [facility] - [facility]  . Hctz [Hydrochlorothiazide] Other (See Comments)    Chest pain  . Nisoldipine Other (See Comments)    Chest pain  . Nitroglycerin Other (See Comments)    Unknown - [facility] MAR  . Propranolol Diarrhea    Chest pain  . Septra [Sulfamethoxazole-Trimethoprim]     Rash, cyst  . Patrici Ranks Fumarate] Other (See Comments)    Unknown reaction  . Valturna [Aliskiren-Valsartan] Other (See Comments)    Unknown reaction     Current Outpatient Prescriptions on File Prior to Visit  Medication Sig Dispense Refill  . Ascorbic Acid (VITAMIN C) 500 MG tablet Take 500 mg by mouth daily.      Marland Kitchen aspirin EC 81 MG tablet Take 81 mg by mouth 2 (two) times  daily.    Marland Kitchen b complex vitamins tablet Take 1 tablet by mouth daily.      . Cholecalciferol (VITAMIN D3) 2000 UNITS capsule Take 2,000 Units by mouth daily.      . fish oil-omega-3 fatty acids 1000 MG capsule Take 2 g by mouth 2 (two) times daily.     . furosemide (LASIX) 20 MG tablet TAKE 3 TABLETS (60 MG TOTAL) BY MOUTH DAILY. 90 tablet 3  . gabapentin (NEURONTIN) 300 MG capsule Take 2 capsules (600 mg total) by mouth at bedtime. 60 capsule 11  . HUMALOG 100 UNIT/ML injection Inject via insulin pump for a total of 70 units per day 70 mL 1  . Insulin Infusion Pump Supplies (INSET INFUSION SET 23" 6MM) MISC 1 Device by Does not apply route every 3 (three) days.    . Insulin Infusion Pump Supplies (INSULIN PUMP SYRINGE RESERVOIR) MISC 1 Device by Does not apply route every 3 (three) days. Animas 2 ml    . metoprolol (LOPRESSOR) 100 MG tablet TAKE 1 TABLET (100 MG TOTAL) BY MOUTH 2 (TWO) TIMES DAILY. 60 tablet 11  . omeprazole (PRILOSEC) 40 MG capsule Take 1 capsule (40 mg total) by mouth daily before breakfast. 90 capsule 3  . pravastatin (PRAVACHOL) 20 MG tablet Take 20 mg by mouth daily.    . Probiotic Product (PROBIOTIC PO) Take 1 tablet by mouth daily at 10 pm.    . traMADol (ULTRAM) 50 MG tablet Take 2 tablets (100 mg total) by mouth every 6 (  six) hours as needed. for pain 120 tablet 3  . valsartan (DIOVAN) 320 MG tablet Take 320 mg by mouth daily.     No current facility-administered medications on file prior to visit.     Past Medical History  Diagnosis Date  . PAD (peripheral artery disease) (HCC)     s/p bilateral comon iliac artery stenting in 2002. Known significant  R SFA  disease. carotid dz noted 11/2011 followed by Dr. Bridgett Larsson  . Diabetes mellitus   . Hypertension   . Hyperlipidemia   . Venous insufficiency   . Chronic diastolic heart failure (HCC)     Echocardiogram 12/10/11: Moderate LVH, EF 65-70%, moderate aortic stenosis, mean gradient 20, mild MS  . Atrial fibrillation  ([place])     not a coumadin candidate secondary to fall risk  . DJD (degenerative joint disease) of hip     s/p R THR 10/2010  . Aortic stenosis     Moderate by echo 4/13 - mean 20 mmHg  . Mitral stenosis     Mild by echo 4/13  . Anemia     iron deficient  . Acute respiratory failure ([place])     12/2011 admission - decreased O2 sats on ambulation, improved by time of discharge  . CAD (coronary artery disease)     minimal plaque by cath 5/12  . CHF (congestive heart failure) (HCC)     EF 55-60%     Past Surgical History  Procedure Laterality Date  . Total hip arthroplasty  1991, 1994    redo in 1994  . Lumbar disc surgery  1999  . Cardiac catheterization  01/10/2011    No significant CAD  . Cholecystectomy    . Abdominal hysterectomy    . Cataract extraction  2015     Family History  Problem Relation Age of Onset  . Diabetes Father   . Diabetes Other     5/8 sibs     Social History   Social History  . Marital Status: Widowed    Spouse Name: N/A  . Number of Children: N/A  . Years of Education: N/A   Occupational History  .      retired   Social History Main Topics  . Smoking status: Former Smoker -- 2.00 packs/day for 25 years    Types: Cigarettes    Quit date: 08/20/1985  . Smokeless tobacco: Never Used     Comment: Retired-widowed 77, lives with son  . Alcohol Use: No  . Drug Use: No  . Sexual Activity: Not Currently    Birth Control/ Protection: Surgical   Other Topics Concern  . Not on file   Social History Narrative   Lives with son   Widowed 65   Quit smoking in [place]   BP 148/68 mmHg  Pulse 64  Ht 5\' 4"  (1.626 m)  Wt 146 lb 1.9 oz (66.28 kg)  BMI 25.07 kg/m2 Constitutional: [name] oriented to person, place, and time. She appears well-developed and well-nourished. No distress.  HENT: No nasal discharge.  Head: Normocephalic and atraumatic.  Eyes: Pupils are equal and round. Right eye exhibits no discharge. Left eye  exhibits no discharge.  Neck: Normal range of motion. Neck supple. No JVD present. No thyromegaly present.  Cardiovascular: Normal rate, irregular rhythm, normal heart sounds. Exam reveals no gallop and no friction rub. 2/6 systolic ejection murmur at the aortic area.  Pulmonary/Chest: Effort normal and breath sounds normal. No stridor.  No respiratory distress. She has no wheezes. She has no rales. She exhibits no tenderness.  Abdominal: Soft. Bowel sounds are normal. She exhibits no distension. There is no tenderness. There is no rebound and no guarding.  Musculoskeletal: Normal range of motion. She exhibits trace edema and no tenderness.  Neurological: [name] alert and oriented to person, place, and time. Coordination normal.  Skin: Skin is warm and dry. Chronic stasis dermatitis with hyperpigmentation. [name] not diaphoretic.  Psychiatric: She has a normal mood and affect. Her behavior is normal. Judgment and thought content normal.   EKG: A-fib, LAD, septal infarct  ASSESSMENT AND PLAN

## 2015-07-05 NOTE — Assessment & Plan Note (Signed)
I suspect that the increased discoloration is due to chronic venous insufficiency with stasis dermatitis. Her leg edema seems to be controlled at the present time.

## 2015-07-05 NOTE — Assessment & Plan Note (Signed)
She reports some increased discomfort in the shin area bilaterally which I think is due to chronic venous insufficiency. She has known history of peripheral arterial disease and I requested lower extremity arterial Doppler to ensure no progression. Given her limited mobility, it's difficult to know if she has claudication.

## 2015-07-05 NOTE — Patient Instructions (Signed)
Medication Instructions:  No changes today  Labwork: None today  Testing/Procedures: Your physician has requested that you have a lower  extremity arterial duplex. This test is an ultrasound of the arteries in the legs. It looks at arterial blood flow in the legs . Allow one hour for Lower Arterial scans. There are no restrictions or special instructions  Your physician has requested that you have an aorta/illiac duplex. Allow 30 minutes for this exam. Do not eat after midnight the day before and avoid carbonated beverages    Follow-Up: Your physician wants you to follow-up in: 6 months with Dr Fletcher [name]. (May 2017). You will receive a reminder letter in the mail two months in advance. If you don't receive a letter, please call our office to schedule the follow-up appointment.       If you need a refill on your cardiac medications before your next appointment, please call your pharmacy.

## 2015-07-11 ENCOUNTER — Encounter: Payer: Self-pay | Admitting: Endocrinology

## 2015-07-11 ENCOUNTER — Ambulatory Visit (INDEPENDENT_AMBULATORY_CARE_PROVIDER_SITE_OTHER): Payer: Medicare Other | Admitting: Endocrinology

## 2015-07-11 VITALS — BP 132/64 | HR 63 | Temp 97.7°F | Ht 64.0 in | Wt 148.0 lb

## 2015-07-11 DIAGNOSIS — E1051 Type 1 diabetes mellitus with diabetic peripheral angiopathy without gangrene: Secondary | ICD-10-CM

## 2015-07-11 LAB — POCT GLYCOSYLATED HEMOGLOBIN (HGB A1C): Hemoglobin A1C: 7.3

## 2015-07-11 NOTE — Progress Notes (Signed)
Subjective:    Patient ID: Emma Yu, female    DOB: 1926-08-29, 81 y.o.   MRN: [id number]  HPI Pt returns for f/u of diabetes mellitus: DM type: 1 Dx'ed: [id number] Complications: CAD, nephropathy, and PAD Therapy: insulin since 2001 GDM: never DKA: never Severe hypoglycemia: never Pancreatitis: never Other: she has a one-touch ping insulin pump; she has declined continuous glucose monitor Interval history: She averages a total of approx 60 units of humalog per day, via her pump.  She uses these settings: basal rate of 0.8 unit/hr, 24 hrs per day.   bolus of 10 units with each meal.   correction bolus (which some people call "sensitivity," or "insulin sensitivity ratio," or just "isr") of 1 unit for each by 25 which your glucose exceeds 120.  she brings a record of her cbg's which i have reviewed today.  It varies from 138-309, but most are in the 100's.  It is in general higher as the day goes on, but not necessarily so.  pt states she feels well in general.   Past Medical History  Diagnosis Date  . PAD (peripheral artery disease) (HCC)     s/p bilateral comon iliac artery stenting in 2002. Known significant  R SFA  disease. carotid dz noted 11/2011 followed by Dr. Bridgett Larsson  . Diabetes mellitus   . Hypertension   . Hyperlipidemia   . Venous insufficiency   . Chronic diastolic heart failure (HCC)     Echocardiogram 12/10/11: Moderate LVH, EF 65-70%, moderate aortic stenosis, mean gradient 20, mild MS  . Atrial fibrillation ([place])     not a coumadin candidate secondary to fall risk  . DJD (degenerative joint disease) of hip     s/p R THR 10/2010  . Aortic stenosis     Moderate by echo 4/13 - mean 20 mmHg  . Mitral stenosis     Mild by echo 4/13  . Anemia     iron deficient  . Acute respiratory failure ([place])     12/2011 admission - decreased O2 sats on ambulation, improved by time of discharge  . CAD (coronary artery disease)     minimal plaque by cath 5/12  . CHF (congestive  heart failure) (HCC)     EF 55-60%    Past Surgical History  Procedure Laterality Date  . Total hip arthroplasty  1991, 1994    redo in 1994  . Lumbar disc surgery  1999  . Cardiac catheterization  01/10/2011    No significant CAD  . Cholecystectomy    . Abdominal hysterectomy    . Cataract extraction  2015    Social History   Social History  . Marital Status: Widowed    Spouse Name: N/A  . Number of Children: N/A  . Years of Education: N/A   Occupational History  .      retired   Social History Main Topics  . Smoking status: Former Smoker -- 2.00 packs/day for 25 years    Types: Cigarettes    Quit date: 08/20/1985  . Smokeless tobacco: Never Used     Comment: Retired-widowed 74, lives with son  . Alcohol Use: No  . Drug Use: No  . Sexual Activity: Not Currently    Birth Control/ Protection: Surgical   Other Topics Concern  . Not on file   Social History Narrative   Lives with son   Widowed 70   Quit smoking in 1987    Current Outpatient Prescriptions on File  Prior to Visit  Medication Sig Dispense Refill  . Ascorbic Acid (VITAMIN C) 500 MG tablet Take 500 mg by mouth daily.      Marland Kitchen aspirin EC 81 MG tablet Take 81 mg by mouth 2 (two) times daily.    Marland Kitchen b complex vitamins tablet Take 1 tablet by mouth daily.      . Cholecalciferol (VITAMIN D3) 2000 UNITS capsule Take 2,000 Units by mouth daily.      . fish oil-omega-3 fatty acids 1000 MG capsule Take 2 g by mouth 2 (two) times daily.     . furosemide (LASIX) 20 MG tablet TAKE 3 TABLETS (60 MG TOTAL) BY MOUTH DAILY. 90 tablet 3  . gabapentin (NEURONTIN) 300 MG capsule Take 2 capsules (600 mg total) by mouth at bedtime. 60 capsule 11  . HUMALOG 100 UNIT/ML injection Inject via insulin pump for a total of 70 units per day 70 mL 1  . Insulin Infusion Pump Supplies (INSET INFUSION SET 23" 6MM) MISC 1 Device by Does not apply route every 3 (three) days.    . Insulin Infusion Pump Supplies (INSULIN PUMP SYRINGE  RESERVOIR) MISC 1 Device by Does not apply route every 3 (three) days. Animas 2 ml    . metoprolol (LOPRESSOR) 100 MG tablet TAKE 1 TABLET (100 MG TOTAL) BY MOUTH 2 (TWO) TIMES DAILY. 60 tablet 11  . omeprazole (PRILOSEC) 40 MG capsule Take 1 capsule (40 mg total) by mouth daily before breakfast. 90 capsule 3  . pravastatin (PRAVACHOL) 20 MG tablet Take 20 mg by mouth daily.    . Probiotic Product (PROBIOTIC PO) Take 1 tablet by mouth daily at 10 pm.    . traMADol (ULTRAM) 50 MG tablet Take 2 tablets (100 mg total) by mouth every 6 (six) hours as needed. for pain 120 tablet 3  . valsartan (DIOVAN) 320 MG tablet Take 320 mg by mouth daily.     No current facility-administered medications on file prior to visit.    Allergies  Allergen Reactions  . Carvedilol Other (See Comments)    Heart stops  . Clindamycin/Lincomycin Other (See Comments)    tremors  . Diltiazem Hcl Other (See Comments)    Chest pain;   07/02/14-  Patient has been able to tolerate diltiazem PO as inpatient since 06/29/14 without any adverse reaction.  . Fenofibrate Other (See Comments)    Unknown - [facility] - MAR  . Hctz [Hydrochlorothiazide] Other (See Comments)    Chest pain  . Nisoldipine Other (See Comments)    Chest pain  . Nitroglycerin Other (See Comments)    Unknown - [facility] MAR  . Propranolol Diarrhea    Chest pain  . Septra [Sulfamethoxazole-Trimethoprim]     Rash, cyst  . Patrici Ranks Fumarate] Other (See Comments)    Unknown reaction  . Valturna [Aliskiren-Valsartan] Other (See Comments)    Unknown reaction    Family History  Problem Relation Age of Onset  . Diabetes Father   . Diabetes Other     5/8 sibs    BP 132/64 mmHg  Pulse 63  Temp(Src) 97.7 F (36.5 C) (Oral)  Ht 5\' 4"  (1.626 m)  Wt 148 lb (67.132 kg)  BMI 25.39 kg/m2  SpO2 92%   Review of Systems She denies hypoglycemia.      Objective:   Physical Exam VITAL SIGNS:  See vs page.   GENERAL: no distress. Pulses: dorsalis  pedis are absent bilaterally.  Feet: no deformity. no ulcer on the feet.  feet are of normal temp. trace bilat leg edema. There is mild (chronic) erythema of the legs. There are bilat varicosities, and rusty discoloration on the legs and feet. There is severe bilateral onychomycosis.  Neuro: sensation is intact to touch on the feet.    A1c=7.3%    Assessment & Plan:  this is the best control this pt should aim for, given variable cbg's, and advanced age.     Patient is advised the following: Patient Instructions  Please continue:  basal rate of 0.8 unit/hr, 24 hrs per day.   bolus of 10 units with each meal.  correction bolus (which some people call "sensitivity," or "insulin sensitivity ratio," or just "isr") of 1 unit for each by 25 which your glucose exceeds 120. check your blood sugar 4 times a day--before the 3 meals, and at bedtime.  also check if you have symptoms of your blood sugar being too high or too low.  please keep a record of the readings and bring it to your next appointment here.  please call us sooner if you are having low blood sugar episodes. Please make a follow-up appointment in 3 months.

## 2015-07-11 NOTE — Patient Instructions (Addendum)
Please continue:  basal rate of 0.8 unit/hr, 24 hrs per day.   bolus of 10 units with each meal.  correction bolus (which some people call "sensitivity," or "insulin sensitivity ratio," or just "isr") of 1 unit for each by 25 which your glucose exceeds 120. check your blood sugar 4 times a day--before the 3 meals, and at bedtime.  also check if you have symptoms of your blood sugar being too high or too low.  please keep a record of the readings and bring it to your next appointment here.  please call us sooner if you are having low blood sugar episodes. Please make a follow-up appointment in 3 months.

## 2015-07-19 ENCOUNTER — Other Ambulatory Visit: Payer: Self-pay | Admitting: Cardiovascular Disease

## 2015-07-19 ENCOUNTER — Ambulatory Visit (HOSPITAL_COMMUNITY)
Admission: RE | Admit: 2015-07-19 | Discharge: 2015-07-19 | Disposition: A | Discharge status: Home / Self Care | Payer: Medicare Other | Source: Ambulatory Visit | Attending: Cardiovascular Disease | Admitting: Cardiovascular Disease

## 2015-07-19 DIAGNOSIS — I708 Atherosclerosis of other arteries: Secondary | ICD-10-CM | POA: Diagnosis not present

## 2015-07-19 DIAGNOSIS — E785 Hyperlipidemia, unspecified: Secondary | ICD-10-CM | POA: Diagnosis not present

## 2015-07-19 DIAGNOSIS — I10 Essential (primary) hypertension: Secondary | ICD-10-CM | POA: Diagnosis not present

## 2015-07-19 DIAGNOSIS — I700 Atherosclerosis of aorta: Secondary | ICD-10-CM | POA: Diagnosis not present

## 2015-07-19 DIAGNOSIS — I739 Peripheral vascular disease, unspecified: Secondary | ICD-10-CM

## 2015-07-19 DIAGNOSIS — E119 Type 2 diabetes mellitus without complications: Secondary | ICD-10-CM | POA: Insufficient documentation

## 2015-07-21 DIAGNOSIS — L821 Other seborrheic keratosis: Secondary | ICD-10-CM | POA: Diagnosis not present

## 2015-07-21 DIAGNOSIS — C4441 Basal cell carcinoma of skin of scalp and neck: Secondary | ICD-10-CM | POA: Diagnosis not present

## 2015-08-11 DIAGNOSIS — E109 Type 1 diabetes mellitus without complications: Secondary | ICD-10-CM | POA: Diagnosis not present

## 2015-08-17 DIAGNOSIS — Z803 Family history of malignant neoplasm of breast: Secondary | ICD-10-CM | POA: Diagnosis not present

## 2015-08-17 DIAGNOSIS — Z1231 Encounter for screening mammogram for malignant neoplasm of breast: Secondary | ICD-10-CM | POA: Diagnosis not present

## 2015-08-17 LAB — HM MAMMOGRAPHY

## 2015-08-18 ENCOUNTER — Encounter: Payer: Self-pay | Admitting: Internal Medicine

## 2015-08-24 ENCOUNTER — Observation Stay (HOSPITAL_COMMUNITY)
Admission: EM | Admit: 2015-08-24 | Discharge: 2015-08-25 | Disposition: A | Discharge status: Home / Self Care | Payer: Medicare Other | Attending: Internal Medicine | Admitting: Internal Medicine

## 2015-08-24 ENCOUNTER — Encounter (HOSPITAL_COMMUNITY): Payer: Self-pay | Admitting: *Deleted

## 2015-08-24 ENCOUNTER — Emergency Department (HOSPITAL_COMMUNITY): Payer: Medicare Other

## 2015-08-24 DIAGNOSIS — M6281 Muscle weakness (generalized): Secondary | ICD-10-CM | POA: Diagnosis not present

## 2015-08-24 DIAGNOSIS — Z87891 Personal history of nicotine dependence: Secondary | ICD-10-CM | POA: Insufficient documentation

## 2015-08-24 DIAGNOSIS — G43909 Migraine, unspecified, not intractable, without status migrainosus: Secondary | ICD-10-CM | POA: Insufficient documentation

## 2015-08-24 DIAGNOSIS — I5032 Chronic diastolic (congestive) heart failure: Secondary | ICD-10-CM | POA: Diagnosis not present

## 2015-08-24 DIAGNOSIS — Z794 Long term (current) use of insulin: Secondary | ICD-10-CM | POA: Diagnosis not present

## 2015-08-24 DIAGNOSIS — E1165 Type 2 diabetes mellitus with hyperglycemia: Secondary | ICD-10-CM | POA: Insufficient documentation

## 2015-08-24 DIAGNOSIS — N200 Calculus of kidney: Secondary | ICD-10-CM | POA: Insufficient documentation

## 2015-08-24 DIAGNOSIS — D509 Iron deficiency anemia, unspecified: Secondary | ICD-10-CM | POA: Insufficient documentation

## 2015-08-24 DIAGNOSIS — N183 Chronic kidney disease, stage 3 unspecified: Secondary | ICD-10-CM | POA: Diagnosis present

## 2015-08-24 DIAGNOSIS — R0789 Other chest pain: Secondary | ICD-10-CM | POA: Diagnosis not present

## 2015-08-24 DIAGNOSIS — Z79899 Other long term (current) drug therapy: Secondary | ICD-10-CM | POA: Insufficient documentation

## 2015-08-24 DIAGNOSIS — E785 Hyperlipidemia, unspecified: Secondary | ICD-10-CM | POA: Insufficient documentation

## 2015-08-24 DIAGNOSIS — R739 Hyperglycemia, unspecified: Secondary | ICD-10-CM

## 2015-08-24 DIAGNOSIS — R079 Chest pain, unspecified: Secondary | ICD-10-CM | POA: Diagnosis not present

## 2015-08-24 DIAGNOSIS — I4891 Unspecified atrial fibrillation: Secondary | ICD-10-CM | POA: Insufficient documentation

## 2015-08-24 DIAGNOSIS — M1990 Unspecified osteoarthritis, unspecified site: Secondary | ICD-10-CM | POA: Insufficient documentation

## 2015-08-24 DIAGNOSIS — I2510 Atherosclerotic heart disease of native coronary artery without angina pectoris: Secondary | ICD-10-CM | POA: Diagnosis present

## 2015-08-24 DIAGNOSIS — R0602 Shortness of breath: Secondary | ICD-10-CM | POA: Diagnosis not present

## 2015-08-24 DIAGNOSIS — Z85828 Personal history of other malignant neoplasm of skin: Secondary | ICD-10-CM | POA: Diagnosis not present

## 2015-08-24 DIAGNOSIS — G8929 Other chronic pain: Secondary | ICD-10-CM | POA: Insufficient documentation

## 2015-08-24 DIAGNOSIS — I10 Essential (primary) hypertension: Secondary | ICD-10-CM | POA: Insufficient documentation

## 2015-08-24 DIAGNOSIS — I350 Nonrheumatic aortic (valve) stenosis: Secondary | ICD-10-CM

## 2015-08-24 DIAGNOSIS — R6889 Other general symptoms and signs: Secondary | ICD-10-CM

## 2015-08-24 DIAGNOSIS — J069 Acute upper respiratory infection, unspecified: Secondary | ICD-10-CM | POA: Diagnosis not present

## 2015-08-24 DIAGNOSIS — I872 Venous insufficiency (chronic) (peripheral): Secondary | ICD-10-CM | POA: Insufficient documentation

## 2015-08-24 DIAGNOSIS — E1051 Type 1 diabetes mellitus with diabetic peripheral angiopathy without gangrene: Secondary | ICD-10-CM | POA: Diagnosis not present

## 2015-08-24 DIAGNOSIS — R531 Weakness: Secondary | ICD-10-CM | POA: Diagnosis not present

## 2015-08-24 DIAGNOSIS — H6691 Otitis media, unspecified, right ear: Secondary | ICD-10-CM | POA: Diagnosis not present

## 2015-08-24 DIAGNOSIS — Z86718 Personal history of other venous thrombosis and embolism: Secondary | ICD-10-CM | POA: Diagnosis not present

## 2015-08-24 HISTORY — DX: Dorsalgia, unspecified: M54.9

## 2015-08-24 HISTORY — DX: Type 2 diabetes mellitus without complications: E11.9

## 2015-08-24 HISTORY — DX: Migraine, unspecified, not intractable, without status migrainosus: G43.909

## 2015-08-24 HISTORY — DX: Calculus of kidney: N20.0

## 2015-08-24 HISTORY — DX: Acute embolism and thrombosis of unspecified deep veins of unspecified lower extremity: I82.409

## 2015-08-24 HISTORY — DX: Presence of insulin pump (external) (internal): Z96.41

## 2015-08-24 HISTORY — DX: Chronic atrial fibrillation, unspecified: I48.20

## 2015-08-24 HISTORY — DX: Acute myocardial infarction, unspecified: I21.9

## 2015-08-24 HISTORY — DX: Iron deficiency anemia, unspecified: D50.9

## 2015-08-24 HISTORY — DX: Other chronic pain: G89.29

## 2015-08-24 HISTORY — DX: Pain in thoracic spine: M54.6

## 2015-08-24 HISTORY — DX: Unspecified malignant neoplasm of skin, unspecified: C44.90

## 2015-08-24 HISTORY — DX: Unspecified osteoarthritis, unspecified site: M19.90

## 2015-08-24 HISTORY — DX: Personal history of other medical treatment: Z92.89

## 2015-08-24 LAB — URINALYSIS, ROUTINE W REFLEX MICROSCOPIC
BILIRUBIN URINE: NEGATIVE
Glucose, UA: NEGATIVE mg/dL
Ketones, ur: NEGATIVE mg/dL
NITRITE: NEGATIVE
Protein, ur: NEGATIVE mg/dL
SPECIFIC GRAVITY, URINE: 1.013 (ref 1.005–1.030)
pH: 5 (ref 5.0–8.0)

## 2015-08-24 LAB — URINE MICROSCOPIC-ADD ON

## 2015-08-24 LAB — CBC
HEMATOCRIT: 46.3 % — AB (ref 36.0–46.0)
HEMOGLOBIN: 14.7 g/dL (ref 12.0–15.0)
MCH: 32.1 pg (ref 26.0–34.0)
MCHC: 31.7 g/dL (ref 30.0–36.0)
MCV: 101.1 fL — AB (ref 78.0–100.0)
Platelets: 262 10*3/uL (ref 150–400)
RBC: 4.58 MIL/uL (ref 3.87–5.11)
RDW: 14.8 % (ref 11.5–15.5)
WBC: 9.5 10*3/uL (ref 4.0–10.5)

## 2015-08-24 LAB — I-STAT TROPONIN, ED: TROPONIN I, POC: 0.02 ng/mL (ref 0.00–0.08)

## 2015-08-24 LAB — BASIC METABOLIC PANEL
ANION GAP: 11 (ref 5–15)
BUN: 40 mg/dL — ABNORMAL HIGH (ref 6–20)
CALCIUM: 10.8 mg/dL — AB (ref 8.9–10.3)
CHLORIDE: 102 mmol/L (ref 101–111)
CO2: 29 mmol/L (ref 22–32)
Creatinine, Ser: 1.49 mg/dL — ABNORMAL HIGH (ref 0.44–1.00)
GFR calc non Af Amer: 31 mL/min — ABNORMAL LOW (ref >60)
GFR, EST AFRICAN AMERICAN: 35 mL/min — AB (ref >60)
GLUCOSE: 361 mg/dL — AB (ref 65–99)
POTASSIUM: 5.1 mmol/L (ref 3.5–5.1)
Sodium: 142 mmol/L (ref 135–145)

## 2015-08-24 LAB — CBG MONITORING, ED: Glucose-Capillary: 321 mg/dL — ABNORMAL HIGH (ref 65–99)

## 2015-08-24 MED ORDER — IRBESARTAN 150 MG PO TABS
300.0000 mg | ORAL_TABLET | Freq: Every day | ORAL | Status: DC
  Administered 2015-08-25: 300 mg via ORAL
  Filled 2015-08-24: qty 2

## 2015-08-24 MED ORDER — OMEGA-3-ACID ETHYL ESTERS 1 G PO CAPS
1.0000 g | ORAL_CAPSULE | Freq: Two times a day (BID) | ORAL | Status: DC
  Administered 2015-08-24 – 2015-08-25 (×2): 1 g via ORAL
  Filled 2015-08-24 (×2): qty 1

## 2015-08-24 MED ORDER — ACETAMINOPHEN 325 MG PO TABS: 650.0000 mg | ORAL_TABLET | ORAL | Status: DC | PRN

## 2015-08-24 MED ORDER — ASPIRIN EC 81 MG PO TBEC
81.0000 mg | DELAYED_RELEASE_TABLET | Freq: Two times a day (BID) | ORAL | Status: DC
  Administered 2015-08-24 – 2015-08-25 (×2): 81 mg via ORAL
  Filled 2015-08-24 (×2): qty 1

## 2015-08-24 MED ORDER — OMEGA-3 FATTY ACIDS 1000 MG PO CAPS: 2.0000 g | ORAL_CAPSULE | Freq: Two times a day (BID) | ORAL | Status: DC

## 2015-08-24 MED ORDER — METOPROLOL TARTRATE 100 MG PO TABS
100.0000 mg | ORAL_TABLET | Freq: Two times a day (BID) | ORAL | Status: DC
Start: 2015-08-25 — End: 2015-08-25
  Administered 2015-08-25: 100 mg via ORAL
  Filled 2015-08-24: qty 1

## 2015-08-24 MED ORDER — FUROSEMIDE 40 MG PO TABS
40.0000 mg | ORAL_TABLET | Freq: Every day | ORAL | Status: DC
  Administered 2015-08-25: 40 mg via ORAL
  Filled 2015-08-24: qty 1

## 2015-08-24 MED ORDER — METOPROLOL TARTRATE 25 MG PO TABS
100.0000 mg | ORAL_TABLET | Freq: Once | ORAL | Status: AC
  Administered 2015-08-24: 100 mg via ORAL
  Filled 2015-08-24: qty 4

## 2015-08-24 MED ORDER — SODIUM CHLORIDE 0.9 % IV BOLUS (SEPSIS)
500.0000 mL | Freq: Once | INTRAVENOUS | Status: AC
  Administered 2015-08-24: 500 mL via INTRAVENOUS

## 2015-08-24 MED ORDER — INSULIN ASPART 100 UNIT/ML ~~LOC~~ SOLN
10.0000 [IU] | Freq: Once | SUBCUTANEOUS | Status: DC
  Filled 2015-08-24: qty 1

## 2015-08-24 MED ORDER — GABAPENTIN 300 MG PO CAPS
600.0000 mg | ORAL_CAPSULE | Freq: Every day | ORAL | Status: DC
  Administered 2015-08-24: 600 mg via ORAL
  Filled 2015-08-24: qty 2

## 2015-08-24 MED ORDER — ONDANSETRON HCL 4 MG/2ML IJ SOLN: 4.0000 mg | Freq: Four times a day (QID) | INTRAMUSCULAR | Status: DC | PRN

## 2015-08-24 MED ORDER — INSULIN PUMP
SUBCUTANEOUS | Status: DC
  Administered 2015-08-25: 11 via SUBCUTANEOUS
  Filled 2015-08-24: qty 1

## 2015-08-24 MED ORDER — VITAMIN D 1000 UNITS PO TABS
2000.0000 [IU] | ORAL_TABLET | Freq: Every day | ORAL | Status: DC
  Administered 2015-08-25: 2000 [IU] via ORAL
  Filled 2015-08-24: qty 2

## 2015-08-24 MED ORDER — ENOXAPARIN SODIUM 40 MG/0.4ML ~~LOC~~ SOLN
40.0000 mg | Freq: Every day | SUBCUTANEOUS | Status: DC
  Administered 2015-08-24: 40 mg via SUBCUTANEOUS
  Filled 2015-08-24: qty 0.4

## 2015-08-24 MED ORDER — TRAMADOL HCL 50 MG PO TABS: 100.0000 mg | ORAL_TABLET | Freq: Four times a day (QID) | ORAL | Status: DC | PRN

## 2015-08-24 MED ORDER — VITAMIN C 500 MG PO TABS
500.0000 mg | ORAL_TABLET | Freq: Every day | ORAL | Status: DC
  Administered 2015-08-25: 500 mg via ORAL
  Filled 2015-08-24: qty 1

## 2015-08-24 MED ORDER — PANTOPRAZOLE SODIUM 40 MG PO TBEC
40.0000 mg | DELAYED_RELEASE_TABLET | Freq: Every day | ORAL | Status: DC
  Administered 2015-08-25: 40 mg via ORAL
  Filled 2015-08-24: qty 1

## 2015-08-24 MED ORDER — PRAVASTATIN SODIUM 20 MG PO TABS
20.0000 mg | ORAL_TABLET | Freq: Every day | ORAL | Status: DC
  Administered 2015-08-25: 20 mg via ORAL
  Filled 2015-08-24: qty 1

## 2015-08-24 NOTE — ED Notes (Signed)
Called x 3 with no answer. Pt was roomed in pod E earlier today but staff states they never saw the pt in the room

## 2015-08-24 NOTE — ED Notes (Signed)
MD at bedside. 

## 2015-08-24 NOTE — ED Notes (Signed)
Pt sent here from [facility]. Pt having sob for extended amount of time, became more severe today and had mid chest pressure. Denies recent cough or swelling to legs.

## 2015-08-24 NOTE — ED Notes (Signed)
Called for pt x2 with no answer

## 2015-08-24 NOTE — ED Notes (Signed)
Pt returned from xray by xray staff, states she has been either in xray or ED waiting and never heard her name called, undischarged and taken to ER room

## 2015-08-24 NOTE — H&P (Signed)
[facility] History and Physical  MANICA CASSIANO [id number] DOB: Apr 30, 1927 DOA: 08/24/2015  Referring physician: Mr.Tran. PCP: [name], MD  Specialists: Dr.Nishan.  Chief Complaint: Chest pain.  HPI: AZHAR BOHACH is a 82 y.o. female with history of diastolic CHF, persistent atrial fibrillation, hypertension, chronic kidney disease, peripheral arterial disease, diabetes mellitus on insulin pump presents to the ER because of weakness and chest pain. Last week patient has some strawberries following which patient had multiple episodes of nausea vomiting and diarrhea. Follow with patient has been feeling weak. Last 2 days patient has been experiencing episodes of chest pressure across her chest. Last for a few minutes each time. In the ER EKG was showing nonspecific changes with chest x-ray showing nothing acute. Cardiac markers have been negative. Patient has been admitted for further workup. Patient denies any fever chills or productive cough.   Review of Systems: As presented in the history of presenting illness, rest negative.  Past Medical History  Diagnosis Date  . PAD (peripheral artery disease) (HCC)     s/p bilateral comon iliac artery stenting in 2002. Known significant  R SFA  disease. carotid dz noted 11/2011 followed by Dr. Bridgett Larsson  . Diabetes mellitus   . Hypertension   . Hyperlipidemia   . Venous insufficiency   . Chronic diastolic heart failure (HCC)     Echocardiogram 12/10/11: Moderate LVH, EF 65-70%, moderate aortic stenosis, mean gradient 20, mild MS  . Atrial fibrillation ([place])     not a coumadin candidate secondary to fall risk  . DJD (degenerative joint disease) of hip     s/p R THR 10/2010  . Aortic stenosis     Moderate by echo 4/13 - mean 20 mmHg  . Mitral stenosis     Mild by echo 4/13  . Anemia     iron deficient  . Acute respiratory failure ([place])     12/2011 admission - decreased O2 sats on ambulation, improved by time of discharge  .  CAD (coronary artery disease)     minimal plaque by cath 5/12  . CHF (congestive heart failure) (HCC)     EF 55-60%   Past Surgical History  Procedure Laterality Date  . Total hip arthroplasty  1991, 1994    redo in 1994  . Lumbar disc surgery  1999  . Cardiac catheterization  01/10/2011    No significant CAD  . Cholecystectomy    . Abdominal hysterectomy    . Cataract extraction  2015   Social History:  reports that she quit smoking about 30 years ago. Her smoking use included Cigarettes. She has a 50 pack-year smoking history. She has never used smokeless tobacco. She reports that she does not drink alcohol or use illicit drugs. Where does patient live home. Can patient participate in ADLs? Yes.  Allergies  Allergen Reactions  . Carvedilol Other (See Comments)    Heart stops  . Clindamycin/Lincomycin Other (See Comments)    tremors  . Diltiazem Hcl Other (See Comments)    Chest pain;   07/02/14-  Patient has been able to tolerate diltiazem PO as inpatient since 06/29/14 without any adverse reaction.  . Fenofibrate Other (See Comments)    Unknown - [facility] - MAR  . Hctz [Hydrochlorothiazide] Other (See Comments)    Chest pain  . Nisoldipine Other (See Comments)    Chest pain  . Nitroglycerin Other (See Comments)    Unknown - [facility] MAR  . Propranolol Diarrhea    Chest  pain  . Septra [Sulfamethoxazole-Trimethoprim]     Rash, cyst  . Patrici Ranks Fumarate] Other (See Comments)    Unknown reaction  . Valturna [Aliskiren-Valsartan] Other (See Comments)    Unknown reaction    Family History:  Family History  Problem Relation Age of Onset  . Diabetes Father   . Diabetes Other     5/8 sibs      Prior to Admission medications   Medication Sig Start Date End Date Taking? Authorizing Provider  Ascorbic Acid (VITAMIN C) 500 MG tablet Take 500 mg by mouth daily.     Yes Historical Provider, MD  aspirin EC 81 MG tablet Take 81 mg by mouth 2 (two) times daily.   Yes  Historical Provider, MD  b complex vitamins tablet Take 1 tablet by mouth daily.     Yes Historical Provider, MD  Cholecalciferol (VITAMIN D3) 2000 UNITS capsule Take 2,000 Units by mouth daily.     Yes Historical Provider, MD  fish oil-omega-3 fatty acids 1000 MG capsule Take 2 g by mouth 2 (two) times daily.    Yes Historical Provider, MD  furosemide (LASIX) 20 MG tablet TAKE 3 TABLETS (60 MG TOTAL) BY MOUTH DAILY. 05/03/15  Yes Jolaine Artist, MD  gabapentin (NEURONTIN) 300 MG capsule Take 2 capsules (600 mg total) by mouth at bedtime. 03/23/15  Yes Janith Lima, MD  HUMALOG 100 UNIT/ML injection Inject via insulin pump for a total of 70 units per day 05/11/15  Yes Renato Shin, MD  metoprolol (LOPRESSOR) 100 MG tablet TAKE 1 TABLET (100 MG TOTAL) BY MOUTH 2 (TWO) TIMES DAILY. 03/17/15  Yes Jolaine Artist, MD  omeprazole (PRILOSEC) 40 MG capsule Take 1 capsule (40 mg total) by mouth daily before breakfast. 03/10/15  Yes Janith Lima, MD  pravastatin (PRAVACHOL) 20 MG tablet Take 20 mg by mouth daily.   Yes Historical Provider, MD  Probiotic Product (PROBIOTIC PO) Take 1 tablet by mouth daily at 10 pm.   Yes Historical Provider, MD  traMADol (ULTRAM) 50 MG tablet Take 2 tablets (100 mg total) by mouth every 6 (six) hours as needed. for pain 01/18/15  Yes Janith Lima, MD  valsartan (DIOVAN) 320 MG tablet Take 320 mg by mouth daily.   Yes Historical Provider, MD  Insulin Infusion Pump Supplies (INSET INFUSION SET 23" 6MM) MISC 1 Device by Does not apply route every 3 (three) days.    Historical Provider, MD  Insulin Infusion Pump Supplies (INSULIN PUMP SYRINGE RESERVOIR) MISC 1 Device by Does not apply route every 3 (three) days. Animas 2 ml    Historical Provider, MD    Physical Exam: Filed Vitals:   08/24/15 1930 08/24/15 2000 08/24/15 2030 08/24/15 2138  BP: 178/67 181/65 144/75 208/49  Pulse: 69 71 55 53  Temp:    98.4 F (36.9 C)  TempSrc:    Oral  Resp: 19 21 18 18   Height:     5\' 4"  (1.626 m)  Weight:    63.6 kg (140 lb 3.4 oz)  SpO2: 97% 98% 98% 97%     General:  Moderately built and nourished.  Eyes: Anicteric no pallor.  ENT: No discharge from the ears eyes nose or mouth.  Neck: No mass felt. No JVD appreciated.  Cardiovascular: S1 and S2 heard.  Respiratory: No rhonchi or crepitations.  Abdomen: Soft nontender bowel sounds present.  Skin: No rash.  Musculoskeletal: No edema.  Psychiatric: Appears normal.  Neurologic: Alert awake oriented  to time place and person. Moves all extremities.  Labs on Admission:  Basic Metabolic Panel:  Recent Labs Lab 08/24/15 1358  NA 142  K 5.1  CL 102  CO2 29  GLUCOSE 361*  BUN 40*  CREATININE 1.49*  CALCIUM 10.8*   Liver Function Tests: No results for input(s): AST, ALT, ALKPHOS, BILITOT, PROT, ALBUMIN in the last 168 hours. No results for input(s): LIPASE, AMYLASE in the last 168 hours. No results for input(s): AMMONIA in the last 168 hours. CBC:  Recent Labs Lab 08/24/15 1358  WBC 9.5  HGB 14.7  HCT 46.3*  MCV 101.1*  PLT 262   Cardiac Enzymes: No results for input(s): CKTOTAL, CKMB, CKMBINDEX, TROPONINI in the last 168 hours.  BNP (last 3 results)  Recent Labs  09/13/14 1601  BNP 269.2*    ProBNP (last 3 results) No results for input(s): PROBNP in the last 8760 hours.  CBG:  Recent Labs Lab 08/24/15 1340  GLUCAP 321*    Radiological Exams on Admission: Dg Chest 2 View  08/24/2015  CLINICAL DATA:  Chest tightness for 2 days with shortness of breath. EXAM: CHEST  2 VIEW COMPARISON:  06/27/2014 FINDINGS: Mild enlargement of the cardiac silhouette. Chronic linear densities at the left lung base could represent scarring. Otherwise, the lungs are clear. No pulmonary edema. Atherosclerotic calcifications in the thoracic aorta. No large pleural effusions. Degenerative changes at the thoracolumbar junction. IMPRESSION: No active cardiopulmonary disease. Electronically Signed    By: Markus Daft M.D.   On: 08/24/2015 17:45    EKG: Independently reviewed. A. fib with rate control.  Assessment/Plan Principal Problem:   Chest pain Active Problems:   Diabetes mellitus type 1 with peripheral artery disease ([place])   Aortic stenosis-moderate   CAD-minimal 2012   Chronic renal insufficiency, stage III (moderate)   Chronic diastolic heart failure ([place])   1. Chest pain - patient at this time is chest pain-free. Given history of aortic stenosis we will recheck 2-D echo to assess if there is any further worsening of her aortic stenosis. Cycle cardiac markers. Continue aspirin. Presently chest pain-free. 2. Diabetes mellitus type 1 uncontrolled - on insulin pump which will be managed per protocol. Closely follow CBGs. 3. Chronic renal insufficiency stage III - creatinine at baseline. 4. Diastolic CHF last EF measured was 65-70% - patient is on Lasix. Closely follow intake output metabolic panel and daily weights. Repeat 2-D echo has been ordered. 5. Persistent atrial fibrillation not a candidate for anticoagulation secondary to her risk of fall. 6. Peripheral arterial disease. 7. Hypertension continue Diovan.   DVT Prophylaxis Lovenox.  Code Status: Full code.  Family Communication: Discussed with patient.  Disposition Plan: Admit for observation.    Raymundo Rout N. [facility] Pager [phone].  If 7PM-7AM, please contact night-coverage [email or web address] Password [email or web address] 08/24/2015, 10:53 PM

## 2015-08-24 NOTE — ED Notes (Signed)
Called for pt x 1 with no answer

## 2015-08-24 NOTE — ED Notes (Signed)
Pt has an insulin pump and did a 10mg  bolus via insulin pump.

## 2015-08-24 NOTE — ED Provider Notes (Signed)
CSN: [id number]     Arrival date & time 08/24/15  1321 History   First MD Initiated Contact with Patient 08/24/15 1338     Chief Complaint  Patient presents with  . Shortness of Breath  . Chest Pain     (Consider location/radiation/quality/duration/timing/severity/associated sxs/prior Treatment) HPI   82 year old female with history of PAD, insulin-dependent diabetes, hypertension, CHF, atrial fibrillation currently not on any blood thinner medication who was sent here from [place] for SOB and Chest pressure.  History was obtained through patient and her friend who is at bedside. Patient has had cold symptoms for the past week including nasal congestion, occasional cough, throat discomfort along with chest tightness. She also report having vomiting and diarrhea for 1 day but that has resolved.  Endorse increased shortness of breath for the past few days and today she also endorses having chest pressure sensation lasting less than 1 hr.  She endorse generalized weakness with weakness to both of her legs. She uses a walker to move around at home. She lives at home with her son. She has been taking Coricidin over-the-counter medication for her cold. Her friend saw her today and was concerned about her health and encouraged her to go to the hospital. She initially went to the urgent care but was sent to the ED for further evaluation. Patient also have history of CHF in which she monitoring her weight daily and states that she is losing some weight. Currently patient denies having any active chest pressure. She did miss her nightly medication, metoprolol 100mg  tab,  due to being in the ER for most of the day today. Pt reportedly had her flu and pna shots this year.   Past Medical History  Diagnosis Date  . PAD (peripheral artery disease) (HCC)     s/p bilateral comon iliac artery stenting in 2002. Known significant  R SFA  disease. carotid dz noted 11/2011 followed by Dr. Bridgett Larsson  . Diabetes mellitus    . Hypertension   . Hyperlipidemia   . Venous insufficiency   . Chronic diastolic heart failure (HCC)     Echocardiogram 12/10/11: Moderate LVH, EF 65-70%, moderate aortic stenosis, mean gradient 20, mild MS  . Atrial fibrillation ([place])     not a coumadin candidate secondary to fall risk  . DJD (degenerative joint disease) of hip     s/p R THR 10/2010  . Aortic stenosis     Moderate by echo 4/13 - mean 20 mmHg  . Mitral stenosis     Mild by echo 4/13  . Anemia     iron deficient  . Acute respiratory failure ([place])     12/2011 admission - decreased O2 sats on ambulation, improved by time of discharge  . CAD (coronary artery disease)     minimal plaque by cath 5/12  . CHF (congestive heart failure) (HCC)     EF 55-60%   Past Surgical History  Procedure Laterality Date  . Total hip arthroplasty  1991, 1994    redo in 1994  . Lumbar disc surgery  1999  . Cardiac catheterization  01/10/2011    No significant CAD  . Cholecystectomy    . Abdominal hysterectomy    . Cataract extraction  2015   Family History  Problem Relation Age of Onset  . Diabetes Father   . Diabetes Other     5/8 sibs   Social History  Substance Use Topics  . Smoking status: Former Smoker -- 2.00 packs/day for  25 years    Types: Cigarettes    Quit date: 08/20/1985  . Smokeless tobacco: Never Used     Comment: Retired-widowed 89, lives with son  . Alcohol Use: No   OB History    No data available     Review of Systems  All other systems reviewed and are negative.     Allergies  Carvedilol; Clindamycin/lincomycin; Diltiazem hcl; Fenofibrate; Hctz; Nisoldipine; Nitroglycerin; Propranolol; Septra; Marisa Severin; and Valturna  Home Medications   Prior to Admission medications   Medication Sig Start Date End Date Taking? Authorizing Provider  Ascorbic Acid (VITAMIN C) 500 MG tablet Take 500 mg by mouth daily.      Historical Provider, MD  aspirin EC 81 MG tablet Take 81 mg by mouth 2 (two) times  daily.    Historical Provider, MD  b complex vitamins tablet Take 1 tablet by mouth daily.      Historical Provider, MD  Cholecalciferol (VITAMIN D3) 2000 UNITS capsule Take 2,000 Units by mouth daily.      Historical Provider, MD  fish oil-omega-3 fatty acids 1000 MG capsule Take 2 g by mouth 2 (two) times daily.     Historical Provider, MD  furosemide (LASIX) 20 MG tablet TAKE 3 TABLETS (60 MG TOTAL) BY MOUTH DAILY. 05/03/15   Jolaine Artist, MD  gabapentin (NEURONTIN) 300 MG capsule Take 2 capsules (600 mg total) by mouth at bedtime. 03/23/15   Janith Lima, MD  HUMALOG 100 UNIT/ML injection Inject via insulin pump for a total of 70 units per day 05/11/15   Renato Shin, MD  Insulin Infusion Pump Supplies (INSET INFUSION SET 23" 6MM) MISC 1 Device by Does not apply route every 3 (three) days.    Historical Provider, MD  Insulin Infusion Pump Supplies (INSULIN PUMP SYRINGE RESERVOIR) MISC 1 Device by Does not apply route every 3 (three) days. Animas 2 ml    Historical Provider, MD  metoprolol (LOPRESSOR) 100 MG tablet TAKE 1 TABLET (100 MG TOTAL) BY MOUTH 2 (TWO) TIMES DAILY. 03/17/15   Jolaine Artist, MD  omeprazole (PRILOSEC) 40 MG capsule Take 1 capsule (40 mg total) by mouth daily before breakfast. 03/10/15   Janith Lima, MD  pravastatin (PRAVACHOL) 20 MG tablet Take 20 mg by mouth daily.    Historical Provider, MD  Probiotic Product (PROBIOTIC PO) Take 1 tablet by mouth daily at 10 pm.    Historical Provider, MD  traMADol (ULTRAM) 50 MG tablet Take 2 tablets (100 mg total) by mouth every 6 (six) hours as needed. for pain 01/18/15   Janith Lima, MD  valsartan (DIOVAN) 320 MG tablet Take 320 mg by mouth daily.    Historical Provider, MD   BP 169/74 mmHg  Pulse 74  Temp(Src) 98.2 F (36.8 C) (Oral)  Resp 20  SpO2 98% Physical Exam  Constitutional: She appears well-developed and well-nourished. No distress.  Frail-appearing elderly Caucasian female with essential tremor.  HENT:   Head: Atraumatic.  Right Ear: External ear normal.  Left Ear: External ear normal.  Nose: Nose normal.  Mouth/Throat: Oropharynx is clear and moist.  Skin irritation noted to parietal scalp from prior skin cancer excision.  Eyes: Conjunctivae are normal.  Neck: Normal range of motion. Neck supple. No JVD present.  No nuchal rigidity  Cardiovascular:  Irregularly irregular heart rhythm  Pulmonary/Chest: Effort normal and breath sounds normal.  Abdominal: Soft. Bowel sounds are normal. There is no tenderness.  Musculoskeletal: She exhibits no edema.  Equal strength to all 4 extremities with poor effort.  Neurological: [name] alert.  Skin: No rash noted.  Psychiatric: She has a normal mood and affect.  Nursing note and vitals reviewed.   ED Course  Procedures (including critical care time) Labs Review Labs Reviewed  BASIC METABOLIC PANEL - Abnormal; Notable for the following:    Glucose, Bld 361 (*)    BUN 40 (*)    Creatinine, Ser 1.49 (*)    Calcium 10.8 (*)    GFR calc non Af Amer 31 (*)    GFR calc Af Amer 35 (*)    All other components within normal limits  CBC - Abnormal; Notable for the following:    HCT 46.3 (*)    MCV 101.1 (*)    All other components within normal limits  CBG MONITORING, ED - Abnormal; Notable for the following:    Glucose-Capillary 321 (*)    All other components within normal limits  I-STAT TROPOININ, ED    Imaging Review Dg Chest 2 View  08/24/2015  CLINICAL DATA:  Chest tightness for 2 days with shortness of breath. EXAM: CHEST  2 VIEW COMPARISON:  06/27/2014 FINDINGS: Mild enlargement of the cardiac silhouette. Chronic linear densities at the left lung base could represent scarring. Otherwise, the lungs are clear. No pulmonary edema. Atherosclerotic calcifications in the thoracic aorta. No large pleural effusions. Degenerative changes at the thoracolumbar junction. IMPRESSION: No active cardiopulmonary disease. Electronically Signed   By:  Markus Daft M.D.   On: 08/24/2015 17:45   I have personally reviewed and evaluated these images and lab results as part of my medical decision-making.   EKG Interpretation   Date/Time:  Wednesday August 24 2015 13:30:45 EST Ventricular Rate:  77 PR Interval:    QRS Duration: 108 QT Interval:  390 QTC Calculation: 441 R Axis:   -86 Text Interpretation:  Atrial fibrillation Left anterior fascicular block  Anteroseptal infarct , age undetermined Abnormal ECG No significant change  since last tracing Confirmed by Christy Gentles  MD, [place] ([id number]) on 08/24/2015  6:20:59 PM      MDM   Final diagnoses:  Other chest pain  Hyperglycemia  General weakness  Flu-like symptoms    BP 139/119 mmHg  Pulse 72  Temp(Src) 98.2 F (36.8 C) (Oral)  Resp 20  Wt 63.702 kg  SpO2 97%   7:18 PM Patient presents with flulike symptoms accompanied with generalized weakness. A chest x-ray shows no evidence of pneumonia. Evidence of renal insufficiency, but at her baseline. She does have evidence of hyperglycemia with a CBG of 361 and normal anion gap. She did not take her medication today therefore her sugar and her blood pressure has been elevated. The symptoms does not suggest ACS however she is a poor historian. No hypoxia to indicate PE. Plan to control symptoms. Discussed with Dr. Christy Gentles.  7:38 PM BP improves with her medication.  Pt prefers to use her insulin pump and administer her own insulin.  We will also check her urine to look for occult infection.  Pt currently requesting to be discharge.  We did offer admission for further management of her generalized weakness and perform cardiac rule out.  8:12 PM Pt agrees to be admitted, will consult medicine for admission.  UA pending.    8:21 PM Appreciate consultation from [facility] Dr. Hal Hope who agrees to see pt in the ER and will admit to tele under his care.    Domenic Moras, PA-C 08/24/15 2021  Ripley Fraise, MD 08/24/15  2126

## 2015-08-25 ENCOUNTER — Ambulatory Visit (HOSPITAL_BASED_OUTPATIENT_CLINIC_OR_DEPARTMENT_OTHER): Payer: Medicare Other

## 2015-08-25 DIAGNOSIS — R079 Chest pain, unspecified: Secondary | ICD-10-CM | POA: Diagnosis not present

## 2015-08-25 DIAGNOSIS — E1051 Type 1 diabetes mellitus with diabetic peripheral angiopathy without gangrene: Secondary | ICD-10-CM

## 2015-08-25 DIAGNOSIS — I350 Nonrheumatic aortic (valve) stenosis: Secondary | ICD-10-CM

## 2015-08-25 DIAGNOSIS — I5032 Chronic diastolic (congestive) heart failure: Secondary | ICD-10-CM

## 2015-08-25 DIAGNOSIS — N189 Chronic kidney disease, unspecified: Secondary | ICD-10-CM | POA: Diagnosis not present

## 2015-08-25 LAB — GLUCOSE, CAPILLARY
GLUCOSE-CAPILLARY: 139 mg/dL — AB (ref 65–99)
Glucose-Capillary: 321 mg/dL — ABNORMAL HIGH (ref 65–99)
Glucose-Capillary: 124 mg/dL — ABNORMAL HIGH (ref 65–99)
Glucose-Capillary: 124 mg/dL — ABNORMAL HIGH (ref 65–99)
Glucose-Capillary: 243 mg/dL — ABNORMAL HIGH (ref 65–99)

## 2015-08-25 LAB — TROPONIN I
TROPONIN I: 0.04 ng/mL — AB (ref <0.031)
TROPONIN I: 0.04 ng/mL — AB (ref <0.031)
TROPONIN I: 0.04 ng/mL — AB (ref <0.031)

## 2015-08-25 MED ORDER — AMLODIPINE BESYLATE 5 MG PO TABS
5.0000 mg | ORAL_TABLET | Freq: Every day | ORAL | Status: DC
  Administered 2015-08-25: 5 mg via ORAL
  Filled 2015-08-25: qty 1

## 2015-08-25 MED ORDER — AMLODIPINE BESYLATE 5 MG PO TABS: 5.0000 mg | ORAL_TABLET | Freq: Every day | ORAL | Status: DC

## 2015-08-25 NOTE — Care Management Obs Status (Signed)
[facility] NOTIFICATION   Patient Details  Name: Emma Yu MRN: [id number] Date of Birth: [birth year]   Medicare Observation Status Notification Given:  Yes    Bethena Roys, RN 08/25/2015, 3:26 PM

## 2015-08-25 NOTE — Discharge Instructions (Signed)
Follow with Primary MD [name], MD in 7 days   Get CBC, CMP,  checked  by Primary MD next visit.    Activity: As tolerated with Full fall precautions use walker/cane & assistance as needed   Disposition Home    Diet: Heart Healthy, Carbohydrate modified , with feeding assistance and aspiration precautions.  For Heart failure patients - Check your Weight same time everyday, if you gain over 2 pounds, or you develop in leg swelling, experience more shortness of breath or chest pain, call your Primary MD immediately. Follow Cardiac Low Salt Diet and 1.5 lit/day fluid restriction.   On your next visit with your primary care physician please Get Medicines reviewed and adjusted.   Please request your Prim.MD to go over all Hospital Tests and Procedure/Radiological results at the follow up, please get all Hospital records sent to your Prim MD by signing hospital release before you go home.   If you experience worsening of your admission symptoms, develop shortness of breath, life threatening emergency, suicidal or homicidal thoughts you must seek medical attention immediately by calling 911 or calling your MD immediately  if symptoms less severe.  You Must read complete instructions/literature along with all the possible adverse reactions/side effects for all the Medicines you take and that have been prescribed to you. Take any new Medicines after you have completely understood and accpet all the possible adverse reactions/side effects.   Do not drive, operating heavy machinery, perform activities at heights, swimming or participation in water activities or provide baby sitting services if your were admitted for syncope or siezures until you have seen by Primary MD or a Neurologist and advised to do so again.  Do not drive when taking Pain medications.    Do not take more than prescribed Pain, Sleep and Anxiety Medications  Special Instructions: If you have smoked or chewed Tobacco  in  the last 2 yrs please stop smoking, stop any regular Alcohol  and or any Recreational drug use.  Wear Seat belts while driving.   Please note  You were cared for by a hospitalist during your hospital stay. If you have any questions about your discharge medications or the care you received while you were in the hospital after you are discharged, you can call the unit and asked to speak with the hospitalist on call if the hospitalist that took care of you is not available. Once you are discharged, your primary care physician will handle any further medical issues. Please note that NO REFILLS for any discharge medications will be authorized once you are discharged, as it is imperative that you return to your primary care physician (or establish a relationship with a primary care physician if you do not have one) for your aftercare needs so that they can reassess your need for medications and monitor your lab values.

## 2015-08-25 NOTE — Progress Notes (Signed)
Inpatient Diabetes Program Recommendations  AACE/ADA: New Consensus Statement on Inpatient Glycemic Control (2015)  Target Ranges:  Prepandial:   less than 140 mg/dL      Peak postprandial:   less than 180 mg/dL (1-2 hours)      Critically ill patients:  140 - 180 mg/dL   Review of Glycemic Control Results for ANGELUS, CONCA (MRN [id number]) as of 08/25/2015 12:05  Ref. Range 08/25/2015 00:21 08/25/2015 04:00 08/25/2015 07:33  Glucose-Capillary Latest Ref Range: 65-99 mg/dL 139 (H) 124 (H) 124 (H)   Diabetes history: Diabetes Mellitus Outpatient Diabetes medications:  Insulin pump/Basal:  0.8 units/hr Novolog 11 units tid with meals-Patient states she is not taking this Current orders for Inpatient glycemic control:  Insulin pump  Inpatient Diabetes Program Recommendations:    Spoke with patient regarding insulin pump.  She see's Dr. Loanne Drilling.  [name] due to change her site today.  She states that her son helps her change her site when needed.  If patient is not d/c'd home today, she will need her son to help her with changing her insulin pump site.    Thanks, Adah Perl, RN, BC-ADM Inpatient Diabetes Coordinator Pager [phone] (8a-5p)

## 2015-08-25 NOTE — Progress Notes (Signed)
Echocardiogram 2D Echocardiogram has been performed.  Emma Yu 08/25/2015, 12:03 PM

## 2015-08-25 NOTE — Discharge Summary (Signed)
Emma Yu, is a 82 y.o. female  DOB [birth year]  MRN [id number].  Admission date:  08/24/2015  Admitting Physician  Rise Patience, MD  Discharge Date:  08/25/2015   Primary MD  [name], MD  Recommendations for primary care physician for things to follow:  - Follow with her cardiologist as an outpatient -  started on amlodipine 5 mg oral daily, may need to titrate   Admission Diagnosis  Other chest pain [R07.89] Hyperglycemia [R73.9] General weakness [R53.1] Flu-like symptoms [R68.89]   Discharge Diagnosis  Other chest pain [R07.89] Hyperglycemia [R73.9] General weakness [R53.1] Flu-like symptoms [R68.89]    Principal Problem:   Chest pain Active Problems:   Diabetes mellitus type 1 with peripheral artery disease ([place])   Aortic stenosis-moderate   CAD-minimal 2012   Chronic renal insufficiency, stage III (moderate)   Chronic diastolic heart failure (HCC)      Past Medical History  Diagnosis Date  . Hypertension   . Hyperlipidemia   . Chronic diastolic heart failure (HCC)     Echocardiogram 12/10/11: Moderate LVH, EF 65-70%, moderate aortic stenosis, mean gradient 20, mild MS  . Atrial fibrillation ([place])     not a coumadin candidate secondary to fall risk  . Aortic stenosis     Moderate by echo 4/13 - mean 20 mmHg  . Mitral stenosis     Mild by echo 4/13  . Acute respiratory failure ([place])     12/2011 admission - decreased O2 sats on ambulation, improved by time of discharge  . CAD (coronary artery disease)     minimal plaque by cath 5/12  . CHF (congestive heart failure) (HCC)     EF 55-60%  . PAD (peripheral artery disease) (HCC)     s/p bilateral comon iliac artery stenting in 2002. Known significant  R SFA  disease. carotid dz noted 11/2011 followed by Dr. Bridgett Larsson  . Venous insufficiency   . Chronic atrial fibrillation with RVR (HCC)   . Myocardial infarction ([place])  12/2010  . DVT (deep venous thrombosis) ([place]) ~ 2012    LLE  . Type II diabetes mellitus ([place]) dx'd 1999  . Iron deficiency anemia   . History of blood transfusion 1957    "related to childbirth"  . Migraine     "none since my hysterectomy" (08/24/2015)  . DJD (degenerative joint disease) of hip     s/p R THR 10/2010  . Arthritis     "back" (08/24/2015)  . Chronic mid back pain   . Skin cancer     right forehead/head  . Kidney stones ~ 1958    "no OR"  . Insulin pump in place     Past Surgical History  Procedure Laterality Date  . Total hip arthroplasty Right 1991  . Lumbar disc surgery  1999  . Cardiac catheterization  01/10/2011    No significant CAD  . Abdominal hysterectomy    . Cataract extraction, bilateral Bilateral 2015  . Tonsillectomy    . Back surgery    .  Cholecystectomy open    . Appendectomy    . Joint replacement    . Revision total hip arthroplasty  1994  . Iliac artery stent Bilateral 2002    [place]  . Skin cancer excision  2016    top of right forehead/head       History of present illness and  Hospital Course:     Kindly see H&P for history of present illness and admission details, please review complete Labs, Consult reports and Test reports for all details in brief  HPI  from the history and physical done on the day of admission 08/23/2014 Emma Yu is a 82 y.o. female with history of diastolic CHF, persistent atrial fibrillation, hypertension, chronic kidney disease, peripheral arterial disease, diabetes mellitus on insulin pump presents to the ER because of weakness and chest pain. Last week patient has some strawberries following which patient had multiple episodes of nausea vomiting and diarrhea. Follow with patient has been feeling weak. Last 2 days patient has been experiencing episodes of chest pressure across her chest. Last for a few minutes each time. In the ER EKG was showing nonspecific changes with chest x-ray showing nothing  acute. Cardiac markers have been negative. Patient has been admitted for further workup. Patient denies any fever chills or productive cough.   Hospital Course   Chest pain - Both typical and nontypical features, admitted for further workup, EKG nonacute, troponins 0.043, OD echo with EF 65-70%, no regional wall motion abnormality, cardiology consulted , they think her chest pain related to GI etiology versus uncontrolled blood pressure, she can be discharged, to follow with her cardiologist as an outpatient.  Hypertension - Uncontrolled, continue with home medication, will start on amlodipine on discharge  Chronic diastolic CHF - Euvolemic, continue with home Lasix  Peripheral arterial disease - s/p bilateral common iliac artery stenting in 2002  - F/u with Dr. Fletcher [name] as schedule  Diabetes mellitus - Continue with her on insulin pump  Hyperlipidemia - On  statin  Chronic renal insufficiency stage III  - creatinine at baseline.  Persistent atrial fibrillation -  not a candidate for anticoagulation secondary to her risk of fall.   Discharge Condition: stable   Follow UP  Follow-up Information    Follow up with Kathlyn Sacramento, MD On 09/06/2015.   Specialty:  Cardiology   Why:  @9 :45 for post hospital   Contact information:   [address] [place] [address] [phone]       Follow up with [name], MD. Schedule an appointment as soon as possible for a visit in 1 week.   Specialty:  Internal Medicine   Why:  post hospitalization follow up.   Contact information:   [address] [address] [phone]         Discharge Instructions  and  Discharge Medications    Discharge Instructions    Discharge instructions    Complete by:  As directed   Follow with Primary MD [name], MD in 7 days   Get CBC, CMP,checked  by Primary MD next visit.    Activity: As tolerated with Full fall precautions use  walker/cane & assistance as needed   Disposition Home    Diet: Heart Healthy , carbohydrate modified, with feeding assistance and aspiration precautions.  For Heart failure patients - Check your Weight same time everyday, if you gain over 2 pounds, or you develop in leg swelling, experience more shortness  of breath or chest pain, call your Primary MD immediately. Follow Cardiac Low Salt Diet and 1.5 lit/day fluid restriction.   On your next visit with your primary care physician please Get Medicines reviewed and adjusted.   Please request your Prim.MD to go over all Hospital Tests and Procedure/Radiological results at the follow up, please get all Hospital records sent to your Prim MD by signing hospital release before you go home.   If you experience worsening of your admission symptoms, develop shortness of breath, life threatening emergency, suicidal or homicidal thoughts you must seek medical attention immediately by calling 911 or calling your MD immediately  if symptoms less severe.  You Must read complete instructions/literature along with all the possible adverse reactions/side effects for all the Medicines you take and that have been prescribed to you. Take any new Medicines after you have completely understood and accpet all the possible adverse reactions/side effects.   Do not drive, operating heavy machinery, perform activities at heights, swimming or participation in water activities or provide baby sitting services if your were admitted for syncope or siezures until you have seen by Primary MD or a Neurologist and advised to do so again.  Do not drive when taking Pain medications.    Do not take more than prescribed Pain, Sleep and Anxiety Medications  Special Instructions: If you have smoked or chewed Tobacco  in the last 2 yrs please stop smoking, stop any regular Alcohol  and or any Recreational drug use.  Wear Seat belts while driving.   Please note  You were  cared for by a hospitalist during your hospital stay. If you have any questions about your discharge medications or the care you received while you were in the hospital after you are discharged, you can call the unit and asked to speak with the hospitalist on call if the hospitalist that took care of you is not available. Once you are discharged, your primary care physician will handle any further medical issues. Please note that NO REFILLS for any discharge medications will be authorized once you are discharged, as it is imperative that you return to your primary care physician (or establish a relationship with a primary care physician if you do not have one) for your aftercare needs so that they can reassess your need for medications and monitor your lab values.     Increase activity slowly    Complete by:  As directed             Medication List    TAKE these medications        amLODipine 5 MG tablet  Commonly known as:  NORVASC  Take 1 tablet (5 mg total) by mouth daily.     aspirin EC 81 MG tablet  Take 81 mg by mouth 2 (two) times daily.     b complex vitamins tablet  Take 1 tablet by mouth daily.     fish oil-omega-3 fatty acids 1000 MG capsule  Take 2 g by mouth 2 (two) times daily.     furosemide 20 MG tablet  Commonly known as:  LASIX  TAKE 3 TABLETS (60 MG TOTAL) BY MOUTH DAILY.     gabapentin 300 MG capsule  Commonly known as:  NEURONTIN  Take 2 capsules (600 mg total) by mouth at bedtime.     HUMALOG 100 UNIT/ML injection  Generic drug:  insulin lispro  Inject via insulin pump for a total of 70 units per day  Insulin Pump Syringe Reservoir Misc  1 Device by Does not apply route every 3 (three) days. Animas 2 ml     INSET INFUSION SET 23" 6MM Misc  1 Device by Does not apply route every 3 (three) days.     metoprolol 100 MG tablet  Commonly known as:  LOPRESSOR  TAKE 1 TABLET (100 MG TOTAL) BY MOUTH 2 (TWO) TIMES DAILY.     omeprazole 40 MG capsule    Commonly known as:  PRILOSEC  Take 1 capsule (40 mg total) by mouth daily before breakfast.     pravastatin 20 MG tablet  Commonly known as:  PRAVACHOL  Take 20 mg by mouth daily.     PROBIOTIC PO  Take 1 tablet by mouth daily at 10 pm.     traMADol 50 MG tablet  Commonly known as:  ULTRAM  Take 2 tablets (100 mg total) by mouth every 6 (six) hours as needed. for pain     valsartan 320 MG tablet  Commonly known as:  DIOVAN  Take 320 mg by mouth daily.     vitamin C 500 MG tablet  Commonly known as:  ASCORBIC ACID  Take 500 mg by mouth daily.     Vitamin D3 2000 units capsule  Take 2,000 Units by mouth daily.          Diet and Activity recommendation: See Discharge Instructions above   Consults obtained -  Cardiology   Major procedures and Radiology Reports - PLEASE review detailed and final reports for all details, in brief -      Dg Chest 2 View  08/24/2015  CLINICAL DATA:  Chest tightness for 2 days with shortness of breath. EXAM: CHEST  2 VIEW COMPARISON:  06/27/2014 FINDINGS: Mild enlargement of the cardiac silhouette. Chronic linear densities at the left lung base could represent scarring. Otherwise, the lungs are clear. No pulmonary edema. Atherosclerotic calcifications in the thoracic aorta. No large pleural effusions. Degenerative changes at the thoracolumbar junction. IMPRESSION: No active cardiopulmonary disease. Electronically Signed   By: Markus Daft M.D.   On: 08/24/2015 17:45    Micro Results     No results found for this or any previous visit (from the past 240 hour(s)).     Today   Subjective:   Elisabel Lesueur today has no headache,no chest or  abdominal pain,no new weakness tingling or numbness.  Objective:   Blood pressure 170/66, pulse 78, temperature 98.2 F (36.8 C), temperature source Oral, resp. rate 20, height 5\' 4"  (1.626 m), weight 63 kg (138 lb 14.2 oz), SpO2 95 %.   Intake/Output Summary (Last 24 hours) at 08/25/15  1453 Last data filed at 08/25/15 1300  Gross per 24 hour  Intake    360 ml  Output   1251 ml  Net   -891 ml    Exam Awake Alert, Oriented x 3, No new F.N deficits, Normal affect [place].AT,PERRAL Supple Neck,No JVD, No cervical lymphadenopathy appriciated.  Symmetrical Chest wall movement, Good air movement bilaterally, CTAB irregular,No Gallops,Rubs or new Murmurs, No Parasternal Heave +ve B.Sounds, Abd Soft, Non tender, No organomegaly appriciated, No rebound -guarding or rigidity. No Cyanosis, Clubbing or edema, chronic lower extremity venous changes  Data Review   CBC w Diff: Lab Results  Component Value Date   WBC 9.5 08/24/2015   HGB 14.7 08/24/2015   HCT 46.3* 08/24/2015   PLT 262 08/24/2015   LYMPHOPCT 28.2 03/10/2015   BANDSPCT PENDING 10/13/2010   MONOPCT 10.6  03/10/2015   EOSPCT 1.9 03/10/2015   BASOPCT 0.2 03/10/2015    CMP: Lab Results  Component Value Date   NA 142 08/24/2015   K 5.1 08/24/2015   CL 102 08/24/2015   CO2 29 08/24/2015   BUN 40* 08/24/2015   CREATININE 1.49* 08/24/2015   CREATININE 1.32* 03/07/2012   PROT 7.9 06/27/2014   ALBUMIN 3.6 06/27/2014   BILITOT 1.4* 06/27/2014   ALKPHOS 109 06/27/2014   AST 28 06/27/2014   ALT 23 06/27/2014  .   Total Time in preparing paper work, data evaluation and todays exam - 35 minutes  ELGERGAWY, DAWOOD M.D on 08/25/2015 at 2:53 PM  [facility]   Office  [phone]

## 2015-08-25 NOTE — Progress Notes (Signed)
Patient discharged home per MD order. Discharge education and handouts provided. All questions answered. RX given to patient. Patient wheelchair escort out and d/c via son's car.

## 2015-08-25 NOTE — Consult Note (Signed)
CARDIOLOGY CONSULT NOTE   Patient ID: Emma Yu MRN: [id number] DOB/AGE: 1926/11/22 82 y.o.  Admit date: 08/24/2015  Primary Physician   [name], MD Primary Cardiologist   Dr. Johnsie Cancel PV: Wellington Hampshire, MD [facility]) Reason for Consultation   Chest pain Requesting Physician Dr. Waldron Labs  HPI: Emma Yu is a 82 y.o. female with a history of aortic stenosis, persistent trial fibrillation - not on anticoagulant due to fall risk, chronic diastolic heart failure, peripheral arterial disease s/p  bilateral common iliac artery stenting in 2002 with known significant right SFA disease, chronic venous insufficiency with significant stasis dermatitis,, hypertension, CKD, DM on insulin pump and hyperlipidemia who sent for urgent care for evaluation of chest pain.   LHC 01/10/11 showed Luminal irregularities.  She also had a revision of right hip surgery . She walks very slowly with a walker. [name] deemed to be not an anticoagulation candidate.  Last echo in 11/15 showed mild AS, mild AI, mild MS, and EF 60-65%.   Last week (7 days ago 08/18/15) patient ate strawberries and developed multiple episode nausea and vomiting. He also had intermitted substernal chest tightness for two days which resolved by itself in few minutes. Since then she is feeling very weak. This lead her to evaluation at urgent care where her EKG was abnormal and set to ED for further evaluation. Also complaining of exertional SOB which has worsen recently. The patient denies fever,palpitations, orthopnea, PND, dizziness, syncope, cough, congestion, abdominal pain, hematochezia, melena, lower extremity edema. Her weight has been stable at 142lb. She watches her diet. No chest tightness for the past 4-5 days.  Poc troponin negative. Trop I 0.04 x 2. EKG showed afib with LAFB. No acute findings. Creatinine 1.49. Blood glucose of 361. UA ? For UTI. CXR clear. Currently chest pain free. Pending echo. BP  running high on admission. Last reading of 180/66   Past Medical History  Diagnosis Date  . Hypertension   . Hyperlipidemia   . Chronic diastolic heart failure (HCC)     Echocardiogram 12/10/11: Moderate LVH, EF 65-70%, moderate aortic stenosis, mean gradient 20, mild MS  . Atrial fibrillation ([place])     not a coumadin candidate secondary to fall risk  . Aortic stenosis     Moderate by echo 4/13 - mean 20 mmHg  . Mitral stenosis     Mild by echo 4/13  . Acute respiratory failure ([place])     12/2011 admission - decreased O2 sats on ambulation, improved by time of discharge  . CAD (coronary artery disease)     minimal plaque by cath 5/12  . CHF (congestive heart failure) (HCC)     EF 55-60%  . PAD (peripheral artery disease) (HCC)     s/p bilateral comon iliac artery stenting in 2002. Known significant  R SFA  disease. carotid dz noted 11/2011 followed by Dr. Bridgett Larsson  . Venous insufficiency   . Chronic atrial fibrillation with RVR (HCC)   . Myocardial infarction ([place]) 12/2010  . DVT (deep venous thrombosis) ([place]) ~ 2012    LLE  . Type II diabetes mellitus ([place]) dx'd 1999  . Iron deficiency anemia   . History of blood transfusion 1957    "related to childbirth"  . Migraine     "none since my hysterectomy" (08/24/2015)  . DJD (degenerative joint disease) of hip     s/p R THR 10/2010  . Arthritis     "back" (08/24/2015)  . Chronic mid  back pain   . Skin cancer     right forehead/head  . Kidney stones ~ 1958    "no OR"  . Insulin pump in place      Past Surgical History  Procedure Laterality Date  . Total hip arthroplasty Right 1991  . Lumbar disc surgery  1999  . Cardiac catheterization  01/10/2011    No significant CAD  . Abdominal hysterectomy    . Cataract extraction, bilateral Bilateral 2015  . Tonsillectomy    . Back surgery    . Cholecystectomy open    . Appendectomy    . Joint replacement    . Revision total hip arthroplasty  1994  . Iliac artery stent Bilateral 2002     [place]  . Skin cancer excision  2016    top of right forehead/head    Allergies  Allergen Reactions  . Carvedilol Other (See Comments)    Heart stops  . Clindamycin/Lincomycin Other (See Comments)    tremors  . Diltiazem Hcl Other (See Comments)    Chest pain;   07/02/14-  Patient has been able to tolerate diltiazem PO as inpatient since 06/29/14 without any adverse reaction.  . Fenofibrate Other (See Comments)    Unknown - [facility] - MAR  . Hctz [Hydrochlorothiazide] Other (See Comments)    Chest pain  . Nisoldipine Other (See Comments)    Chest pain  . Nitroglycerin Other (See Comments)    Unknown - [place] MAR  . Propranolol Diarrhea    Chest pain  . Septra [Sulfamethoxazole-Trimethoprim]     Rash, cyst  . Patrici Ranks Fumarate] Other (See Comments)    Unknown reaction  . Valturna [Aliskiren-Valsartan] Other (See Comments)    Unknown reaction    I have reviewed the patient's current medications . aspirin EC  81 mg Oral BID  . cholecalciferol  2,000 Units Oral Daily  . enoxaparin (LOVENOX) injection  40 mg Subcutaneous QHS  . furosemide  40 mg Oral Daily  . gabapentin  600 mg Oral QHS  . insulin pump   Subcutaneous 6 times per day  . irbesartan  300 mg Oral Daily  . metoprolol  100 mg Oral BID  . omega-3 acid ethyl esters  1 g Oral BID  . pantoprazole  40 mg Oral Daily  . pravastatin  20 mg Oral Daily  . vitamin C  500 mg Oral Daily     acetaminophen, ondansetron (ZOFRAN) IV, traMADol  Prior to Admission medications   Medication Sig Start Date End Date Taking? Authorizing Provider  Ascorbic Acid (VITAMIN C) 500 MG tablet Take 500 mg by mouth daily.     Yes Historical Provider, MD  aspirin EC 81 MG tablet Take 81 mg by mouth 2 (two) times daily.   Yes Historical Provider, MD  b complex vitamins tablet Take 1 tablet by mouth daily.     Yes Historical Provider, MD  Cholecalciferol (VITAMIN D3) 2000 UNITS capsule Take 2,000 Units by mouth daily.     Yes Historical  Provider, MD  fish oil-omega-3 fatty acids 1000 MG capsule Take 2 g by mouth 2 (two) times daily.    Yes Historical Provider, MD  furosemide (LASIX) 20 MG tablet TAKE 3 TABLETS (60 MG TOTAL) BY MOUTH DAILY. 05/03/15  Yes Jolaine Artist, MD  gabapentin (NEURONTIN) 300 MG capsule Take 2 capsules (600 mg total) by mouth at bedtime. 03/23/15  Yes Janith Lima, MD  HUMALOG 100 UNIT/ML injection Inject via insulin pump  for a total of 70 units per day 05/11/15  Yes Renato Shin, MD  metoprolol (LOPRESSOR) 100 MG tablet TAKE 1 TABLET (100 MG TOTAL) BY MOUTH 2 (TWO) TIMES DAILY. 03/17/15  Yes Jolaine Artist, MD  omeprazole (PRILOSEC) 40 MG capsule Take 1 capsule (40 mg total) by mouth daily before breakfast. 03/10/15  Yes Janith Lima, MD  pravastatin (PRAVACHOL) 20 MG tablet Take 20 mg by mouth daily.   Yes Historical Provider, MD  Probiotic Product (PROBIOTIC PO) Take 1 tablet by mouth daily at 10 pm.   Yes Historical Provider, MD  traMADol (ULTRAM) 50 MG tablet Take 2 tablets (100 mg total) by mouth every 6 (six) hours as needed. for pain 01/18/15  Yes Janith Lima, MD  valsartan (DIOVAN) 320 MG tablet Take 320 mg by mouth daily.   Yes Historical Provider, MD  Insulin Infusion Pump Supplies (INSET INFUSION SET 23" 6MM) MISC 1 Device by Does not apply route every 3 (three) days.    Historical Provider, MD  Insulin Infusion Pump Supplies (INSULIN PUMP SYRINGE RESERVOIR) MISC 1 Device by Does not apply route every 3 (three) days. [place] 2 ml    Historical Provider, MD     Social History   Social History  . Marital Status: Widowed    Spouse Name: N/A  . Number of Children: N/A  . Years of Education: N/A   Occupational History  .      retired   Social History Main Topics  . Smoking status: Former Smoker -- 2.00 packs/day for 37 years    Types: Cigarettes    Quit date: 08/20/1985  . Smokeless tobacco: Never Used  . Alcohol Use: No  . Drug Use: No  . Sexual Activity: No   Other Topics  Concern  . Not on file   Social History Narrative   Lives with son   Widowed 61   Quit smoking in 67    Family Status  Relation Status Death Age  . Maternal Grandmother Deceased   . Maternal Grandfather Deceased   . Paternal Grandmother Deceased   . Paternal Grandfather Deceased    Family History  Problem Relation Age of Onset  . Diabetes Father   . Diabetes Other     5/8 sibs     ROS:  Full 14 point review of systems complete and found to be negative unless listed above.  Physical Exam: Blood pressure 180/66, pulse 78, temperature 97.7 F (36.5 C), temperature source Oral, resp. rate 18, height 5\' 4"  (1.626 m), weight 138 lb 14.2 oz (63 kg), SpO2 100 %.  General: Well developed, well nourished, female in no acute distress Head: Eyes PERRLA, No xanthomas. Normocephalic and atraumatic, oropharynx without edema or exudate.  Lungs: Resp regular and unlabored. CTA Heart: Ir Ir  no s3, s4, or murmurs..   Neck: No carotid bruits. No lymphadenopathy.  No JVD. Abdomen: Bowel sounds present, abdomen soft and non-tender without masses or hernias noted. Msk:  No spine or cva tenderness. No weakness, no joint deformities or effusions. Extremities: No  edema. DP 1+ and equal bilaterally. Chronic venous changes to BL LE.  Neuro: Alert and oriented X 3. No focal deficits noted. Psych:  Good affect, responds appropriately Skin: No rashes or lesions noted.  Labs:   Lab Results  Component Value Date   WBC 9.5 08/24/2015   HGB 14.7 08/24/2015   HCT 46.3* 08/24/2015   MCV 101.1* 08/24/2015   PLT 262 08/24/2015   No  results for input(s): INR in the last 72 hours.  Recent Labs Lab 08/24/15 1358  NA 142  K 5.1  CL 102  CO2 29  BUN 40*  CREATININE 1.49*  CALCIUM 10.8*  GLUCOSE 361*   MAGNESIUM  Date Value Ref Range Status  12/25/2011 1.8 1.5 - 2.5 mg/dL Final    Recent Labs  08/25/15 0050 08/25/15 0430  TROPONINI 0.04* 0.04*    Recent Labs  08/24/15 1356    TROPIPOC 0.02    Lab Results  Component Value Date   CHOL 133 03/10/2015   HDL 54.20 03/10/2015   LDLCALC 42 03/10/2015   TRIG 186.0* 03/10/2015    Echo: 06/2014 LV EF: 65% -  70%  ------------------------------------------------------------------- Indications:   CHF - 428.0.  ------------------------------------------------------------------- History:  PMH:  Coronary artery disease. Risk factors: Former tobacco use. Hypertension. Diabetes mellitus.  ------------------------------------------------------------------- Study Conclusions  - Left ventricle: The cavity size was normal. There was severe concentric hypertrophy. Systolic function was vigorous. The estimated ejection fraction was in the range of 65% to 70%. Wall motion was normal; there were no regional wall motion abnormalities. - Aortic valve: Cusp separation was reduced. There was mild stenosis. There was mild regurgitation. Peak velocity (S): 226 cm/s. Mean gradient (S): 11 mm Hg. Valve area (VTI): 1.36 cm^2. Valve area (Vmax): 1.37 cm^2. Valve area (Vmean): 1.4 cm^2. - Mitral valve: Severely calcified annulus. Mobility of the posterior leaflet was restricted. The findings are consistent with mild stenosis. There was mild regurgitation. Valve area by continuity equation (using LVOT flow): 1.09 cm^2. - Left atrium: The atrium was moderately dilated. - Pulmonary arteries: Systolic pressure was mildly increased. PA peak pressure: 43 mm Hg (S).  ECG:  Vent. rate 77 BPM PR interval * ms QRS duration 108 ms QT/QTc 390/441 ms P-R-T axes * -86 57  Radiology:  Dg Chest 2 View  08/24/2015  CLINICAL DATA:  Chest tightness for 2 days with shortness of breath. EXAM: CHEST  2 VIEW COMPARISON:  06/27/2014 FINDINGS: Mild enlargement of the cardiac silhouette. Chronic linear densities at the left lung base could represent scarring. Otherwise, the lungs are clear. No pulmonary edema.  Atherosclerotic calcifications in the thoracic aorta. No large pleural effusions. Degenerative changes at the thoracolumbar junction. IMPRESSION: No active cardiopulmonary disease. Electronically Signed   By: Markus Daft M.D.   On: 08/24/2015 17:45    ASSESSMENT AND PLAN:     1. Chest pain - With typical and atypical features. oc troponin negative. Trop I 0.04 x 2. EKG showed afib with LAFB. No acute findings. Currently chest pain free.  - Differential could include GI etiology vs elevated blood pressure.  - LHC 01/10/11 showed Luminal irregularities.   2. Hypertension - Uncontrolled. Continue home meds.  - Will add amlodipine 2.5mg  qd. Titrate up as needed. Hx of multiple fall inpast. Will avoid hypotension.   3.   Chronic diastolic heart failure (HCC) - Euvolemic. Continue lasix.  4. AS - Last echo in 11/15 showed mild AS, mild AI, mild MS, and EF 60-65%. - Pending repeat echo  5. Peripheral arterial disease with known significant right SFA disease, chronic venous insufficiency with significant stasis dermatitis -  s/p  bilateral common iliac artery stenting in 2002  - F/u with Dr. Fletcher [name] as schedule  6. DM - per primary  7. Hl - Continue statin  Dispo: Ok to discharge on cardiac stand point.   SignedLeanor Kail, PA 08/25/2015, 8:54 AM   Co-Sign MD   Attending  Note:   The patient was seen and examined.  Agree with assessment and plan as noted above.  Changes made to the above note as needed.  Was referred to [facility] for nausea and vomitting after eating strawberries - very atypical for any angina like presentaton . , negative troponin levels.   Shes not had any angina pain OK to follow up with Dr. Johnsie Cancel / dr. Fletcher [name]  2. Essential HTN:   Continue current meds. Will add amlodipine 2.5 a day      Ramond Dial., MD, [facility] 08/25/2015, 3:40 PM [address],  [place] Pager [phone]

## 2015-08-26 ENCOUNTER — Telehealth: Payer: Self-pay | Admitting: *Deleted

## 2015-08-26 NOTE — Telephone Encounter (Signed)
Transition Care Management Follow-up Telephone Call   Date discharged? 08/25/15   How have you been since you were released from the hospital? Pt states she is doing pretty good   Do you understand why you were in the hospital? YES   Do you understand the discharge instructions? YES   Where were you discharged to? Home   Items Reviewed:  Medications reviewed: YES  Allergies reviewed: YES  Dietary changes reviewed: NO  Referrals reviewed: No referral needed   Functional Questionnaire:   Activities of Daily Living (ADLs):   She states she are independent in the following: ambulation, bathing and hygiene, feeding, continence, grooming, toileting and dressing States she doesn't require assistance    Any transportation issues/concerns?: NO   Any patient concerns? NO   Confirmed importance and date/time of follow-up visits scheduled YES, made appt for 08/31/15  Provider Appointment booked with Dr. Ronnald Ramp  Confirmed with patient if condition begins to worsen call PCP or go to the ER.  Patient was given the office number and encouraged to call back with question or concerns.  : YES

## 2015-08-31 ENCOUNTER — Encounter: Payer: Self-pay | Admitting: Internal Medicine

## 2015-08-31 ENCOUNTER — Ambulatory Visit (INDEPENDENT_AMBULATORY_CARE_PROVIDER_SITE_OTHER): Payer: Medicare Other | Admitting: Internal Medicine

## 2015-08-31 ENCOUNTER — Other Ambulatory Visit (INDEPENDENT_AMBULATORY_CARE_PROVIDER_SITE_OTHER): Payer: Medicare Other

## 2015-08-31 VITALS — BP 128/62 | HR 75 | Temp 98.5°F | Resp 16 | Ht 64.0 in

## 2015-08-31 DIAGNOSIS — E1051 Type 1 diabetes mellitus with diabetic peripheral angiopathy without gangrene: Secondary | ICD-10-CM

## 2015-08-31 DIAGNOSIS — D510 Vitamin B12 deficiency anemia due to intrinsic factor deficiency: Secondary | ICD-10-CM

## 2015-08-31 DIAGNOSIS — I10 Essential (primary) hypertension: Secondary | ICD-10-CM

## 2015-08-31 DIAGNOSIS — Z09 Encounter for follow-up examination after completed treatment for conditions other than malignant neoplasm: Secondary | ICD-10-CM | POA: Diagnosis not present

## 2015-08-31 DIAGNOSIS — M150 Primary generalized (osteo)arthritis: Secondary | ICD-10-CM

## 2015-08-31 DIAGNOSIS — E785 Hyperlipidemia, unspecified: Secondary | ICD-10-CM | POA: Diagnosis not present

## 2015-08-31 DIAGNOSIS — J302 Other seasonal allergic rhinitis: Secondary | ICD-10-CM

## 2015-08-31 DIAGNOSIS — M159 Polyosteoarthritis, unspecified: Secondary | ICD-10-CM

## 2015-08-31 DIAGNOSIS — M549 Dorsalgia, unspecified: Secondary | ICD-10-CM

## 2015-08-31 DIAGNOSIS — G8929 Other chronic pain: Secondary | ICD-10-CM

## 2015-08-31 DIAGNOSIS — I872 Venous insufficiency (chronic) (peripheral): Secondary | ICD-10-CM

## 2015-08-31 DIAGNOSIS — J301 Allergic rhinitis due to pollen: Secondary | ICD-10-CM | POA: Insufficient documentation

## 2015-08-31 LAB — LIPID PANEL
CHOLESTEROL: 117 mg/dL (ref 0–200)
HDL: 48.8 mg/dL (ref >39.00)
LDL CALC: 42 mg/dL (ref 0–99)
NonHDL: 68.32
TRIGLYCERIDES: 133 mg/dL (ref 0.0–149.0)
Total CHOL/HDL Ratio: 2
VLDL: 26.6 mg/dL (ref 0.0–40.0)

## 2015-08-31 LAB — CBC WITH DIFFERENTIAL/PLATELET
BASOS ABS: 0 10*3/uL (ref 0.0–0.1)
Basophils Relative: 0.6 % (ref 0.0–3.0)
EOS ABS: 0.1 10*3/uL (ref 0.0–0.7)
Eosinophils Relative: 1.4 % (ref 0.0–5.0)
HCT: 40.8 % (ref 36.0–46.0)
Hemoglobin: 13.3 g/dL (ref 12.0–15.0)
LYMPHS ABS: 1.7 10*3/uL (ref 0.7–4.0)
Lymphocytes Relative: 27.3 % (ref 12.0–46.0)
MCHC: 32.5 g/dL (ref 30.0–36.0)
MCV: 97.1 fl (ref 78.0–100.0)
Monocytes Absolute: 0.8 10*3/uL (ref 0.1–1.0)
Monocytes Relative: 12.4 % — ABNORMAL HIGH (ref 3.0–12.0)
NEUTROS ABS: 3.7 10*3/uL (ref 1.4–7.7)
NEUTROS PCT: 58.3 % (ref 43.0–77.0)
PLATELETS: 218 10*3/uL (ref 150.0–400.0)
RBC: 4.21 Mil/uL (ref 3.87–5.11)
RDW: 15.6 % — ABNORMAL HIGH (ref 11.5–15.5)
WBC: 6.3 10*3/uL (ref 4.0–10.5)

## 2015-08-31 LAB — BASIC METABOLIC PANEL
BUN: 57 mg/dL — ABNORMAL HIGH (ref 6–23)
CHLORIDE: 101 meq/L (ref 96–112)
CO2: 33 mEq/L — ABNORMAL HIGH (ref 19–32)
Calcium: 10.6 mg/dL — ABNORMAL HIGH (ref 8.4–10.5)
Creatinine, Ser: 1.56 mg/dL — ABNORMAL HIGH (ref 0.40–1.20)
GFR: 33.43 mL/min — ABNORMAL LOW (ref >60.00)
Glucose, Bld: 202 mg/dL — ABNORMAL HIGH (ref 70–99)
POTASSIUM: 5.1 meq/L (ref 3.5–5.1)
SODIUM: 140 meq/L (ref 135–145)

## 2015-08-31 LAB — URINALYSIS, ROUTINE W REFLEX MICROSCOPIC
Bilirubin Urine: NEGATIVE
Ketones, ur: NEGATIVE
Nitrite: POSITIVE — AB
Specific Gravity, Urine: 1.015 (ref 1.000–1.030)
Total Protein, Urine: NEGATIVE
URINE GLUCOSE: NEGATIVE
UROBILINOGEN UA: 0.2 (ref 0.0–1.0)
pH: 5.5 (ref 5.0–8.0)

## 2015-08-31 LAB — MICROALBUMIN / CREATININE URINE RATIO
Creatinine,U: 59.4 mg/dL
MICROALB/CREAT RATIO: 3.4 mg/g (ref 0.0–30.0)
Microalb, Ur: 2 mg/dL — ABNORMAL HIGH (ref 0.0–1.9)

## 2015-08-31 LAB — TSH: TSH: 1.9 u[IU]/mL (ref 0.35–4.50)

## 2015-08-31 LAB — HEMOGLOBIN A1C: HEMOGLOBIN A1C: 8 % — AB (ref 4.6–6.5)

## 2015-08-31 MED ORDER — AZELASTINE-FLUTICASONE 137-50 MCG/ACT NA SUSP: 1.0000 | Freq: Two times a day (BID) | NASAL | Status: DC

## 2015-08-31 MED ORDER — AMLODIPINE BESYLATE 5 MG PO TABS: 5.0000 mg | ORAL_TABLET | Freq: Every day | ORAL | Status: DC

## 2015-08-31 MED ORDER — VASCULERA PO TABS: 1.0000 | ORAL_TABLET | Freq: Every day | ORAL | Status: DC

## 2015-08-31 MED ORDER — INSULIN LISPRO 100 UNIT/ML ~~LOC~~ SOLN: SUBCUTANEOUS | Status: DC

## 2015-08-31 MED ORDER — TRAMADOL HCL 50 MG PO TABS: 100.0000 mg | ORAL_TABLET | Freq: Four times a day (QID) | ORAL | Status: DC | PRN

## 2015-08-31 NOTE — Patient Instructions (Signed)
Type 1 Diabetes Mellitus, Adult Type 1 diabetes mellitus, often simply referred to as diabetes, is a long-term (chronic) disease. It occurs when the islet cells in the pancreas that make insulin (a hormone) are destroyed and can no longer make insulin. Insulin is needed to move sugars from food into the tissue cells. The tissue cells use the sugars for energy. In people with type 1 diabetes, the sugars build up in the blood instead of going into the tissue cells. As a result, high blood sugar (hyperglycemia) develops. Without insulin, the body breaks down fat cells for the needed energy. This breakdown of fat cells produces acid chemicals (ketones), which increases the acid levels in the body. The effect of either high ketone or high sugar (glucose) levels can be life-threatening.  Type 1 diabetes was also previously called juvenile diabetes. It most often occurs before the age of 30, but it can occur at any age. RISK FACTORS A person is predisposed to developing type 1 diabetes if someone in his or her family has the disease and is exposed to certain additional environmental triggers.  SYMPTOMS  Symptoms of type 1 diabetes may develop gradually over days to weeks or suddenly. The symptoms occur due to hyperglycemia. The symptoms can include:   Increased thirst (polydipsia).  Increased urination (polyuria).  Increased urination during the night (nocturia).  Weight loss. This weight loss may be rapid.  Frequent, recurring infections.  Tiredness (fatigue).  Weakness.  Vision changes, such as blurred vision.  Fruity smell to your breath.  Abdominal pain.  Nausea or vomiting.  An open skin wound (ulcer). DIAGNOSIS  Type 1 diabetes is diagnosed when symptoms of diabetes are present and when blood glucose levels are increased. Your blood glucose level may be checked by one or more of the following blood tests:  A fasting blood glucose test. You will not be allowed to eat for at least 8  hours before a blood sample is taken.  A random blood glucose test. Your blood glucose is checked at any time of the day regardless of when you ate.  A hemoglobin A1c blood glucose test. A hemoglobin A1c test provides information about blood glucose control over the previous 3 months. TREATMENT  Although type 1 diabetes cannot be prevented, it can be managed with insulin, diet, and exercise.  You will need to take insulin daily to keep blood glucose in the desired range.  You will need to match insulin dosing with exercise and healthy food choices. Generally, the goal of treatment is to maintain a pre-meal (preprandial) blood glucose level of 80-130 mg/dL. HOME CARE INSTRUCTIONS   Have your hemoglobin A1c level checked twice a year.  Perform daily blood glucose monitoring as directed by your health care provider.  Monitor urine ketones when you are ill and as directed by your health care provider.  Take your insulin as directed by your health care provider to maintain your blood glucose level in the desired range.  Never run out of insulin. It is needed every day.  Adjust insulin based on your intake of carbohydrates. Carbohydrates can raise blood glucose levels but need to be included in your diet. Carbohydrates provide vitamins, minerals, and fiber, which are an essential part of a healthy diet. Carbohydrates are found in fruits, vegetables, whole grains, dairy products, legumes, and foods containing added sugars.  Eat healthy foods. Alternate 3 meals with 3 snacks.  Maintain a healthy weight.  Carry a medical alert card or wear your medical alert   jewelry.  Carry a 15-gram carbohydrate snack with you at all times to treat low blood glucose (hypoglycemia). Some examples of 15-gram carbohydrate snacks include:  Glucose tablets, 3 or 4.  Glucose gel, 15-gram tube.  Raisins, 2 tablespoons (24 grams).  Jelly beans, 6.  Animal crackers, 8.  Fruit juice, regular soda, or  low-fat milk, 4 ounces (120 mL).  Gummy treats, 9.  Recognize hypoglycemia. Hypoglycemia occurs with blood glucose levels of 70 mg/dL and below. The risk for hypoglycemia increases when fasting or skipping meals, during or after intense exercise, and during sleep. Hypoglycemia symptoms can include:  Tremors or shakes.  Decreased ability to concentrate.  Sweating.  Increased heart rate.  Headache.  Dry mouth.  Hunger.  Irritability.  Anxiety.  Restless sleep.  Altered speech or coordination.  Confusion.  Treat hypoglycemia promptly. If you are alert and able to safely swallow, follow the 15:15 rule:  Take 15-20 grams of rapid-acting glucose or carbohydrate. Rapid-acting options include glucose gel, glucose tablets, or 4 ounces (120 mL) of fruit juice, regular soda, or low-fat milk.  Check your blood glucose level 15 minutes after taking the glucose.  Take 15-20 grams more of glucose if the repeat blood glucose level is still 70 mg/dL or below.  Eat a meal or snack within 1 hour once blood glucose levels return to normal.  Be alert to polyuria and polydipsia, which are early signs of hyperglycemia. An early awareness of hyperglycemia allows for prompt treatment. Treat hyperglycemia as directed by your health care provider.  Exercise regularly as directed by your health care provider. This includes:  Stretching and performing strength training exercises, such as yoga or weight lifting, at least 2 times per week.  Performing a total of at least 150 minutes of moderate-intensity exercise each week, such as brisk walking or water aerobics.  Exercising at least 3 days per week, making sure you allow no more than 2 consecutive days to pass without exercising.  Avoiding long periods of inactivity (90 minutes or more). When you have to spend an extended period of time sitting down, take frequent breaks to walk or stretch.  Adjust your insulin dosing and food intake as needed  if you start a new exercise or sport.  Follow your sick-day plan at any time you are unable to eat or drink as usual.   Do not use any tobacco products including cigarettes, chewing tobacco, or electronic cigarettes. If you need help quitting, ask your health care provider.  Limit alcohol intake to no more than 1 drink per day for nonpregnant women and 2 drinks per day for men. You should drink alcohol only when you are also eating food. Talk with your health care provider about whether alcohol is safe for you. Tell your health care provider if you drink alcohol several times a week.  Keep all follow-up visits as directed by your health care provider.  Schedule an eye exam within 5 years of diagnosis and then annually.  Perform daily skin and foot care. Examine your skin and feet daily for cuts, bruises, redness, nail problems, bleeding, blisters, or sores. A foot exam should be done by a health care provider 5 years after diagnosis, and then every year after the first exam.  Brush your teeth and gums at least twice a day and floss at least once a day. Follow up with your dentist regularly.  Share your diabetes management plan with your workplace or school.  Keep your immunizations up to date. It   is recommended that you receive a flu (influenza) vaccine every year. It is also recommended that you receive a pneumonia (pneumococcal) vaccine. If you are 65 years of age or older and have never received a pneumonia vaccine, this vaccine may be given as a series of two separate shots. Ask your health care provider which additional vaccines may be recommended.  Learn to manage stress.  Obtain ongoing diabetes education and support as needed.  Participate in or seek rehabilitation as needed to maintain or improve independence and quality of life. Request a physical or occupational therapy referral if you are having foot or hand numbness, or difficulties with grooming, dressing, eating, or physical  activity. SEEK MEDICAL CARE IF:   You are unable to eat food or drink fluids for more than 6 hours.  You have nausea and vomiting for more than 6 hours.  Your blood glucose level is over 240 mg/dL.  There is a change in mental status.  You develop an additional serious illness.  You have diarrhea for more than 6 hours.  You have been sick or have had a fever for a couple of days and are not getting better.  You have pain during any physical activity. SEEK IMMEDIATE MEDICAL CARE IF:  You have difficulty breathing.  You have moderate to large ketone levels. MAKE SURE YOU:  Understand these instructions.  Will watch your condition.  Will get help right away if you are not doing well or get worse.   This information is not intended to replace advice given to you by your health care provider. Make sure you discuss any questions you have with your health care provider.   Document Released: 08/03/2000 Document Revised: 04/27/2015 Document Reviewed: 03/04/2012 Elsevier Interactive Patient Education 2016 [employer]  

## 2015-08-31 NOTE — Progress Notes (Signed)
Pre visit review using our clinic review tool, if applicable. No additional management support is needed unless otherwise documented below in the visit note. 

## 2015-08-31 NOTE — Progress Notes (Signed)
Subjective:  Patient ID: Emma Yu, female    DOB: 08/15/1927  Age: 82 y.o. MRN: [id number]  CC: Allergic Rhinitis  and Diabetes   HPI QUINTASIA ROKES presents for a f/up after a recent admission for CP and elevated BP, she has not had any more episodes of CP and she denies SOB, cough, edema, fatigue.  Chest pain - Both typical and nontypical features, admitted for further workup, EKG nonacute, troponins 0.043, OD echo with EF 65-70%, no regional wall motion abnormality, cardiology consulted , they think her chest pain related to GI etiology versus uncontrolled blood pressure, she can be discharged, to follow with her cardiologist as an outpatient.   Outpatient Prescriptions Prior to Visit  Medication Sig Dispense Refill  . Ascorbic Acid (VITAMIN C) 500 MG tablet Take 500 mg by mouth daily.      Marland Kitchen aspirin EC 81 MG tablet Take 81 mg by mouth 2 (two) times daily.    Marland Kitchen b complex vitamins tablet Take 1 tablet by mouth daily.      . Cholecalciferol (VITAMIN D3) 2000 UNITS capsule Take 2,000 Units by mouth daily.      . fish oil-omega-3 fatty acids 1000 MG capsule Take 2 g by mouth 2 (two) times daily.     . furosemide (LASIX) 20 MG tablet TAKE 3 TABLETS (60 MG TOTAL) BY MOUTH DAILY. 90 tablet 3  . gabapentin (NEURONTIN) 300 MG capsule Take 2 capsules (600 mg total) by mouth at bedtime. 60 capsule 11  . Insulin Infusion Pump Supplies (INSET INFUSION SET 23" 6MM) MISC 1 Device by Does not apply route every 3 (three) days.    . Insulin Infusion Pump Supplies (INSULIN PUMP SYRINGE RESERVOIR) MISC 1 Device by Does not apply route every 3 (three) days. Animas 2 ml    . metoprolol (LOPRESSOR) 100 MG tablet TAKE 1 TABLET (100 MG TOTAL) BY MOUTH 2 (TWO) TIMES DAILY. 60 tablet 11  . omeprazole (PRILOSEC) 40 MG capsule Take 1 capsule (40 mg total) by mouth daily before breakfast. 90 capsule 3  . pravastatin (PRAVACHOL) 20 MG tablet Take 20 mg by mouth daily.    . Probiotic Product  (PROBIOTIC PO) Take 1 tablet by mouth daily at 10 pm.    . valsartan (DIOVAN) 320 MG tablet Take 320 mg by mouth daily.    Marland Kitchen amLODipine (NORVASC) 5 MG tablet Take 1 tablet (5 mg total) by mouth daily. 30 tablet 0  . HUMALOG 100 UNIT/ML injection Inject via insulin pump for a total of 70 units per day 70 mL 1  . traMADol (ULTRAM) 50 MG tablet Take 2 tablets (100 mg total) by mouth every 6 (six) hours as needed. for pain 120 tablet 3   No facility-administered medications prior to visit.    ROS Review of Systems  Constitutional: Negative.  Negative for fever, chills, diaphoresis, appetite change and fatigue.  HENT: Positive for congestion, postnasal drip and rhinorrhea. Negative for facial swelling, nosebleeds, sinus pressure, sore throat, tinnitus, trouble swallowing and voice change.   Eyes: Negative.   Respiratory: Negative.  Negative for cough, choking, chest tightness, shortness of breath and stridor.   Cardiovascular: Negative.  Negative for chest pain, palpitations and leg swelling.  Gastrointestinal: Negative.  Negative for nausea, vomiting, abdominal pain, diarrhea and constipation.  Endocrine: Negative.  Negative for polydipsia, polyphagia and polyuria.  Genitourinary: Negative.   Musculoskeletal: Positive for back pain and arthralgias. Negative for myalgias, joint swelling, gait problem and neck pain.  Skin: Negative.  Negative for color change and rash.       She complains of worsening dark spots on her lower extremities with some tiny ulcers on the left side.  Allergic/Immunologic: Negative.   Neurological: Negative.   Hematological: Negative.  Negative for adenopathy. Does not bruise/bleed easily.  Psychiatric/Behavioral: Negative.     Objective:  BP 128/62 mmHg  Pulse 75  Temp(Src) 98.5 F (36.9 C) (Oral)  Resp 16  Ht 5\' 4"  (1.626 m)  SpO2 96%  BP Readings from Last 3 Encounters:  08/31/15 128/62  08/25/15 181/93  07/11/15 132/64    Wt Readings from Last 3  Encounters:  08/25/15 138 lb 14.2 oz (63 kg)  07/11/15 148 lb (67.132 kg)  07/05/15 146 lb 1.9 oz (66.28 kg)    Physical Exam  Constitutional: No distress.  HENT:  Nose: Mucosal edema and rhinorrhea present. No sinus tenderness or nasal deformity. No epistaxis. Right sinus exhibits no maxillary sinus tenderness and no frontal sinus tenderness. Left sinus exhibits no maxillary sinus tenderness and no frontal sinus tenderness.  Mouth/Throat: Oropharynx is clear and moist and mucous membranes are normal. Mucous membranes are not pale, not dry and not cyanotic. No oral lesions. No trismus in the jaw. No uvula swelling. No oropharyngeal exudate, posterior oropharyngeal edema, posterior oropharyngeal erythema or tonsillar abscesses.  Eyes: Conjunctivae are normal. Right eye exhibits no discharge. Left eye exhibits no discharge. No scleral icterus.  Neck: Normal range of motion. Neck supple. No JVD present. No tracheal deviation present. No thyromegaly present.  Cardiovascular: Normal rate, regular rhythm, S1 normal, S2 normal and intact distal pulses.  Exam reveals no gallop and no friction rub.   Murmur heard.  Systolic murmur is present with a grade of 2/6   Decrescendo diastolic murmur is present  Pulmonary/Chest: Effort normal and breath sounds normal. No stridor. No respiratory distress. She has no wheezes. She has no rales. She exhibits no tenderness.  Abdominal: Soft. Bowel sounds are normal. She exhibits no distension and no mass. There is no tenderness. There is no rebound and no guarding.  Musculoskeletal: Normal range of motion. She exhibits no edema or tenderness.  Lymphadenopathy:    She has no cervical adenopathy.  Skin: Skin is warm and dry. No rash noted. [name] not diaphoretic. No erythema. No pallor.       Lab Results  Component Value Date   WBC 6.3 08/31/2015   HGB 13.3 08/31/2015   HCT 40.8 08/31/2015   PLT 218.0 08/31/2015   GLUCOSE 202* 08/31/2015   CHOL 117  08/31/2015   TRIG 133.0 08/31/2015   HDL 48.80 08/31/2015   LDLCALC 42 08/31/2015   ALT 23 06/27/2014   AST 28 06/27/2014   NA 140 08/31/2015   K 5.1 08/31/2015   CL 101 08/31/2015   CREATININE 1.56* 08/31/2015   BUN 57* 08/31/2015   CO2 33* 08/31/2015   TSH 1.90 08/31/2015   INR 1.18 12/25/2011   HGBA1C 8.0* 08/31/2015   MICROALBUR 2.0* 08/31/2015    Dg Chest 2 View  08/24/2015  CLINICAL DATA:  Chest tightness for 2 days with shortness of breath. EXAM: CHEST  2 VIEW COMPARISON:  06/27/2014 FINDINGS: Mild enlargement of the cardiac silhouette. Chronic linear densities at the left lung base could represent scarring. Otherwise, the lungs are clear. No pulmonary edema. Atherosclerotic calcifications in the thoracic aorta. No large pleural effusions. Degenerative changes at the thoracolumbar junction. IMPRESSION: No active cardiopulmonary disease. Electronically Signed   By: Quita Skye  Anselm Pancoast M.D.   On: 08/24/2015 17:45    Assessment & Plan:   Reeves was seen today for allergic rhinitis  and diabetes.  Diagnoses and all orders for this visit:  Diabetes mellitus type 1 with peripheral artery disease ([place])- her blood sugars are not well-controlled, her A1c is up to 8%, she will adjust her insulin pump to address this. -     Basic metabolic panel; Future -     Hemoglobin A1c; Future -     Microalbumin / creatinine urine ratio; Future -     insulin lispro (HUMALOG) 100 UNIT/ML injection; Inject via insulin pump for a total of 70 units per day  Pernicious anemia- improvement noted -     CBC with Differential/Platelet; Future  Hyperlipidemia with target LDL less than 70- she has achieved her LDL goal -     Lipid panel; Future -     TSH; Future  Chronic venous insufficiency -     Dietary Management Product (VASCULERA) TABS; Take 1 capsule by mouth daily.  Primary osteoarthritis involving multiple joints -     traMADol (ULTRAM) 50 MG tablet; Take 2 tablets (100 mg total) by mouth every 6  (six) hours as needed. for pain  Back pain, chronic -     traMADol (ULTRAM) 50 MG tablet; Take 2 tablets (100 mg total) by mouth every 6 (six) hours as needed. for pain  Essential hypertension, benign- her blood pressure is well-controlled with the addition of amlodipine, her electrolytes and renal function are stable, continue the same regimen. -     amLODipine (NORVASC) 5 MG tablet; Take 1 tablet (5 mg total) by mouth daily. -     Basic metabolic panel; Future -     Urinalysis, Routine w reflex microscopic (not at [facility]); Future  Other seasonal allergic rhinitis -     Azelastine-Fluticasone (DYMISTA) 137-50 MCG/ACT SUSP; Place 1 Act into the nose 2 (two) times daily.  I have changed Ms. Dishon's HUMALOG to insulin lispro. I am also having her start on VASCULERA and Azelastine-Fluticasone. Additionally, I am having her maintain her vitamin C, Vitamin D3, b complex vitamins, fish oil-omega-3 fatty acids, aspirin EC, Probiotic Product (PROBIOTIC PO), Insulin Pump Syringe Reservoir, INSET INFUSION SET 23" 6MM, valsartan, omeprazole, metoprolol, gabapentin, furosemide, pravastatin, traMADol, and amLODipine.  Meds ordered this encounter  Medications  . traMADol (ULTRAM) 50 MG tablet    Sig: Take 2 tablets (100 mg total) by mouth every 6 (six) hours as needed. for pain    Dispense:  120 tablet    Refill:  5    This request is for a new prescription for a controlled substance as required by Federal/State law.  Marland Kitchen amLODipine (NORVASC) 5 MG tablet    Sig: Take 1 tablet (5 mg total) by mouth daily.    Dispense:  30 tablet    Refill:  11  . Dietary Management Product (VASCULERA) TABS    Sig: Take 1 capsule by mouth daily.    Dispense:  30 tablet    Refill:  11  . insulin lispro (HUMALOG) 100 UNIT/ML injection    Sig: Inject via insulin pump for a total of 70 units per day    Dispense:  70 mL    Refill:  3  . Azelastine-Fluticasone (DYMISTA) 137-50 MCG/ACT SUSP    Sig: Place 1 Act into the  nose 2 (two) times daily.    Dispense:  1 Bottle    Refill:  11  Follow-up: Return in about 6 months (around 02/28/2016).  [name], MD

## 2015-09-05 ENCOUNTER — Other Ambulatory Visit (HOSPITAL_COMMUNITY): Payer: Self-pay | Admitting: Internal Medicine

## 2015-09-06 ENCOUNTER — Ambulatory Visit (INDEPENDENT_AMBULATORY_CARE_PROVIDER_SITE_OTHER): Payer: Medicare Other | Admitting: Cardiovascular Disease

## 2015-09-06 ENCOUNTER — Encounter: Payer: Self-pay | Admitting: Internal Medicine

## 2015-09-06 ENCOUNTER — Encounter: Payer: Self-pay | Admitting: Cardiovascular Disease

## 2015-09-06 VITALS — BP 120/58 | HR 58 | Ht 63.0 in | Wt 146.0 lb

## 2015-09-06 DIAGNOSIS — I10 Essential (primary) hypertension: Secondary | ICD-10-CM

## 2015-09-06 DIAGNOSIS — I482 Chronic atrial fibrillation, unspecified: Secondary | ICD-10-CM

## 2015-09-06 DIAGNOSIS — I5032 Chronic diastolic (congestive) heart failure: Secondary | ICD-10-CM

## 2015-09-06 DIAGNOSIS — I739 Peripheral vascular disease, unspecified: Secondary | ICD-10-CM | POA: Diagnosis not present

## 2015-09-06 MED ORDER — FUROSEMIDE 20 MG PO TABS: ORAL_TABLET | ORAL | Status: DC

## 2015-09-06 NOTE — Assessment & Plan Note (Signed)
She appears to be euvolemic on current dose of furosemide. 

## 2015-09-06 NOTE — Progress Notes (Signed)
Primary cardiologist:Dr.Nishan   HPI  This is an 82 year old female who is here today for a follow up visit regarding peripheral arterial disease and recent hospitalization.  She has a history of chronic Atrial fibrillation not on anticoagulation due to recurrent falls, chronic diastolic heart failure, peripheral arterial disease, status post bilateral common iliac artery stenting in 2002  with known significant right SFA disease, diabetes, hypertension, hyperlipidemia. LHC 01/10/11: Luminal irregularities.  She walks very slowly with a walker.  She has known history of chronic venous insufficiency with significant stasis dermatitis. [facility] was briefly hospitalized [facility] recently for weakness and atypical chest pain. She had nausea and vomiting after she ate strawberries. She ruled out for myocardial infarction. Echocardiogram showed vigorous LV systolic function, moderate left ventricular hypertrophy,  biatrial enlargement, calcified aortic valve without aortic stenosis. Her blood pressure was running high while hospitalized and amlodipine was added. However, since she left the hospital her blood pressure has been running low and she had to hold Diovan last night.    Allergies  Allergen Reactions  . Carvedilol Other (See Comments)    Heart stops  . Clindamycin/Lincomycin Other (See Comments)    tremors  . Diltiazem Hcl Other (See Comments)    Chest pain;   07/02/14-  Patient has been able to tolerate diltiazem PO as inpatient since 06/29/14 without any adverse reaction.  . Fenofibrate Other (See Comments)    Unknown - NH - MAR  . Hctz [Hydrochlorothiazide] Other (See Comments)    Chest pain  . Nisoldipine Other (See Comments)    Chest pain  . Nitroglycerin Other (See Comments)    Unknown - NH MAR  . Propranolol Diarrhea    Chest pain  . Septra [Sulfamethoxazole-Trimethoprim]     Rash, cyst  . Patrici Ranks  Fumarate] Other (See Comments)    Unknown reaction  . Valturna [Aliskiren-Valsartan] Other (See Comments)    Unknown reaction     Current Outpatient Prescriptions on File Prior to Visit  Medication Sig Dispense Refill  . Ascorbic Acid (VITAMIN C) 500 MG tablet Take 500 mg by mouth daily.      Marland Kitchen aspirin EC 81 MG tablet Take 81 mg by mouth 2 (two) times daily.    . Azelastine-Fluticasone (DYMISTA) 137-50 MCG/ACT SUSP Place 1 Act into the nose 2 (two) times daily. 1 Bottle 11  . b complex vitamins tablet Take 1 tablet by mouth daily.      . Cholecalciferol (VITAMIN D3) 2000 UNITS capsule Take 2,000 Units by mouth daily.      . Dietary Management Product (VASCULERA) TABS Take 1 capsule by mouth daily. 30 tablet 11  . fish oil-omega-3 fatty acids 1000 MG capsule Take 2 g by mouth 2 (two) times daily.     Marland Kitchen gabapentin (NEURONTIN) 300 MG capsule Take 2 capsules (600 mg total) by mouth at bedtime. 60 capsule 11  . Insulin Infusion Pump Supplies (INSET INFUSION SET 23" 6MM) MISC 1 Device by Does not apply route every 3 (three) days.    . Insulin Infusion Pump Supplies (INSULIN PUMP SYRINGE RESERVOIR) MISC 1 Device by Does not apply route every 3 (three) days. Animas 2 ml    . insulin lispro (HUMALOG) 100 UNIT/ML injection Inject via insulin pump for a total of 70 units per day 70 mL 3  . metoprolol (LOPRESSOR) 100 MG tablet TAKE 1 TABLET (100 MG TOTAL) BY MOUTH 2 (TWO) TIMES DAILY. 60 tablet 11  . omeprazole (PRILOSEC) 40 MG capsule Take 1  capsule (40 mg total) by mouth daily before breakfast. 90 capsule 3  . pravastatin (PRAVACHOL) 20 MG tablet Take 20 mg by mouth daily.    . Probiotic Product (PROBIOTIC PO) Take 1 tablet by mouth daily at 10 pm.    . traMADol (ULTRAM) 50 MG tablet Take 2 tablets (100 mg total) by mouth every 6 (six) hours as needed. for pain 120 tablet 5  . valsartan (DIOVAN) 320 MG tablet Take 320 mg by mouth daily.     No current facility-administered medications on file prior  to visit.     Past Medical History  Diagnosis Date  . Hypertension   . Hyperlipidemia   . Chronic diastolic heart failure (HCC)     Echocardiogram 12/10/11: Moderate LVH, EF 65-70%, moderate aortic stenosis, mean gradient 20, mild MS  . Atrial fibrillation ([place])     not a coumadin candidate secondary to fall risk  . Aortic stenosis     Moderate by echo 4/13 - mean 20 mmHg  . Mitral stenosis     Mild by echo 4/13  . Acute respiratory failure ([place])     12/2011 admission - decreased O2 sats on ambulation, improved by time of discharge  . CAD (coronary artery disease)     minimal plaque by cath 5/12  . CHF (congestive heart failure) (HCC)     EF 55-60%  . PAD (peripheral artery disease) (HCC)     s/p bilateral comon iliac artery stenting in 2002. Known significant  R SFA  disease. carotid dz noted 11/2011 followed by Dr. Bridgett Larsson  . Venous insufficiency   . Chronic atrial fibrillation with RVR (HCC)   . Myocardial infarction ([place]) 12/2010  . DVT (deep venous thrombosis) ([place]) ~ 2012    LLE  . Type II diabetes mellitus ([place]) dx'd 1999  . Iron deficiency anemia   . History of blood transfusion 1957    "related to childbirth"  . Migraine     "none since my hysterectomy" (08/24/2015)  . DJD (degenerative joint disease) of hip     s/p R THR 10/2010  . Arthritis     "back" (08/24/2015)  . Chronic mid back pain   . Skin cancer     right forehead/head  . Kidney stones ~ 1958    "no OR"  . Insulin pump in place      Past Surgical History  Procedure Laterality Date  . Total hip arthroplasty Right 1991  . Lumbar disc surgery  1999  . Cardiac catheterization  01/10/2011    No significant CAD  . Abdominal hysterectomy    . Cataract extraction, bilateral Bilateral 2015  . Tonsillectomy    . Back surgery    . Cholecystectomy open    . Appendectomy    . Joint replacement    . Revision total hip arthroplasty  1994  . Iliac artery stent Bilateral 2002    [place]  . Skin cancer  excision  2016    top of right forehead/head     Family History  Problem Relation Age of Onset  . Diabetes Father   . Diabetes Other     5/8 sibs     Social History   Social History  . Marital Status: Widowed    Spouse Name: N/A  . Number of Children: N/A  . Years of Education: N/A   Occupational History  .      retired   Social History Main Topics  . Smoking status: Former Smoker --  2.00 packs/day for 37 years    Types: Cigarettes    Quit date: 08/20/1985  . Smokeless tobacco: Never Used  . Alcohol Use: No  . Drug Use: No  . Sexual Activity: No   Other Topics Concern  . Not on file   Social History Narrative   Lives with son   Widowed 7   Quit smoking in [place]   BP 120/58 mmHg  Pulse 58  Ht 5\' 3"  (1.6 m)  Wt 146 lb (66.225 kg)  BMI 25.87 kg/m2 Constitutional: [name] oriented to person, place, and time. She appears well-developed and well-nourished. No distress.  HENT: No nasal discharge.  Head: Normocephalic and atraumatic.  Eyes: Pupils are equal and round. Right eye exhibits no discharge. Left eye exhibits no discharge.  Neck: Normal range of motion. Neck supple. No JVD present. No thyromegaly present.  Cardiovascular: Normal rate, irregular rhythm, normal heart sounds. Exam reveals no gallop and no friction rub. 2/6 systolic ejection murmur at the aortic area.  Pulmonary/Chest: Effort normal and breath sounds normal. No stridor. No respiratory distress. She has no wheezes. She has no rales. She exhibits no tenderness.  Abdominal: Soft. Bowel sounds are normal. She exhibits no distension. There is no tenderness. There is no rebound and no guarding.  Musculoskeletal: Normal range of motion. She exhibits trace edema and no tenderness.  Neurological: [name] alert and oriented to person, place, and time. Coordination normal.  Skin: Skin is warm and dry. Chronic stasis dermatitis with hyperpigmentation. [name] not diaphoretic.    Psychiatric: She has a normal mood and affect. Her behavior is normal. Judgment and thought content normal.    ASSESSMENT AND PLAN

## 2015-09-06 NOTE — Assessment & Plan Note (Signed)
Her mobility is limited and she does not complain of claudication. Recent ABI was stable.

## 2015-09-06 NOTE — Patient Instructions (Signed)
Medication Instructions:  Your physician has recommended you make the following change in your medication:  1. STOP Amlodipine  Labwork: No new orders.   Testing/Procedures: No new orders.   Follow-Up: Your physician wants you to follow-up in: 6 MONTHS with Dr Fletcher [name].  You will receive a reminder letter in the mail two months in advance. If you don't receive a letter, please call our office to schedule the follow-up appointment.   Any Other Special Instructions Will Be Listed Below (If Applicable).     If you need a refill on your cardiac medications before your next appointment, please call your pharmacy.

## 2015-09-06 NOTE — Assessment & Plan Note (Signed)
Blood pressure has been running low since the addition of amlodipine. Thus, I discontinued the medication.

## 2015-09-06 NOTE — Assessment & Plan Note (Signed)
Ventricular rate is well controlled on current dose of metoprolol. [name] mildly bradycardic but overall is asymptomatic.she has been deemed to be not an anticoagulation candidate.

## 2015-09-08 ENCOUNTER — Ambulatory Visit: Payer: Medicare Other | Admitting: Internal Medicine

## 2015-09-12 ENCOUNTER — Ambulatory Visit: Payer: Medicare Other | Admitting: Internal Medicine

## 2015-09-21 ENCOUNTER — Telehealth: Payer: Self-pay | Admitting: *Deleted

## 2015-09-21 ENCOUNTER — Telehealth: Payer: Self-pay | Admitting: Endocrinology

## 2015-09-21 NOTE — Telephone Encounter (Signed)
Left msg on triage stating receive letter from [facility] needing md to send them rx's for diabetic supplies...Johny Chess

## 2015-09-21 NOTE — Telephone Encounter (Signed)
FYI, Patient will let her PCP take care of her diabetic needs from now on.Thanks for everything

## 2015-09-21 NOTE — Telephone Encounter (Signed)
See note below to be advised.  Thanks! 

## 2015-09-22 NOTE — Telephone Encounter (Signed)
Called [facility] will be faxing Dr. Ronnald Ramp new order...Johny Chess

## 2015-10-06 ENCOUNTER — Encounter: Payer: Self-pay | Admitting: Internal Medicine

## 2015-10-06 ENCOUNTER — Ambulatory Visit (INDEPENDENT_AMBULATORY_CARE_PROVIDER_SITE_OTHER): Payer: Medicare Other | Admitting: Internal Medicine

## 2015-10-06 ENCOUNTER — Ambulatory Visit (INDEPENDENT_AMBULATORY_CARE_PROVIDER_SITE_OTHER)
Admission: RE | Admit: 2015-10-06 | Discharge: 2015-10-06 | Disposition: A | Discharge status: Home / Self Care | Payer: Medicare Other | Source: Ambulatory Visit | Attending: Internal Medicine | Admitting: Internal Medicine

## 2015-10-06 VITALS — BP 120/60 | HR 73 | Temp 97.7°F | Resp 20 | Wt 145.0 lb

## 2015-10-06 DIAGNOSIS — R05 Cough: Secondary | ICD-10-CM

## 2015-10-06 DIAGNOSIS — E1051 Type 1 diabetes mellitus with diabetic peripheral angiopathy without gangrene: Secondary | ICD-10-CM | POA: Diagnosis not present

## 2015-10-06 DIAGNOSIS — J22 Unspecified acute lower respiratory infection: Secondary | ICD-10-CM

## 2015-10-06 DIAGNOSIS — R0602 Shortness of breath: Secondary | ICD-10-CM | POA: Diagnosis not present

## 2015-10-06 DIAGNOSIS — L89301 Pressure ulcer of unspecified buttock, stage 1: Secondary | ICD-10-CM | POA: Diagnosis not present

## 2015-10-06 DIAGNOSIS — R059 Cough, unspecified: Secondary | ICD-10-CM

## 2015-10-06 DIAGNOSIS — J988 Other specified respiratory disorders: Secondary | ICD-10-CM | POA: Diagnosis not present

## 2015-10-06 MED ORDER — INSULIN LISPRO 100 UNIT/ML ~~LOC~~ SOLN: SUBCUTANEOUS | Status: DC

## 2015-10-06 MED ORDER — AMOXICILLIN-POT CLAVULANATE 875-125 MG PO TABS: 1.0000 | ORAL_TABLET | Freq: Two times a day (BID) | ORAL | Status: DC

## 2015-10-06 MED ORDER — IPRATROPIUM-ALBUTEROL 20-100 MCG/ACT IN AERS: 1.0000 | INHALATION_SPRAY | Freq: Four times a day (QID) | RESPIRATORY_TRACT | Status: DC | PRN

## 2015-10-06 MED ORDER — PROMETHAZINE-DM 6.25-15 MG/5ML PO SYRP: 5.0000 mL | ORAL_SOLUTION | Freq: Four times a day (QID) | ORAL | Status: DC | PRN

## 2015-10-06 NOTE — Patient Instructions (Signed)

## 2015-10-06 NOTE — Progress Notes (Signed)
Pre visit review using our clinic review tool, if applicable. No additional management support is needed unless otherwise documented below in the visit note. 

## 2015-10-06 NOTE — Progress Notes (Signed)
Subjective:  Patient ID: Emma Yu, female    DOB: [birth year]  Age: 82 y.o. MRN: [id number]  CC: Cough   HPI Emma Yu presents for a 5 day history of cough that is productive of thick yellow phlegm. She also complains of shortness of breath and wheezing but no fever, chills, chest pain, night sweats, or dizziness.  She also complains of sores on her bottom that she wants me to take a look at.  Outpatient Prescriptions Prior to Visit  Medication Sig Dispense Refill  . Ascorbic Acid (VITAMIN C) 500 MG tablet Take 500 mg by mouth daily.      Marland Kitchen aspirin EC 81 MG tablet Take 81 mg by mouth 2 (two) times daily.    Marland Kitchen b complex vitamins tablet Take 1 tablet by mouth daily.      . Cholecalciferol (VITAMIN D3) 2000 UNITS capsule Take 2,000 Units by mouth daily.      . fish oil-omega-3 fatty acids 1000 MG capsule Take 2 g by mouth 2 (two) times daily.     . furosemide (LASIX) 20 MG tablet TAKE 3 TABLETS (60 MG TOTAL) BY MOUTH DAILY. 90 tablet 6  . gabapentin (NEURONTIN) 300 MG capsule Take 2 capsules (600 mg total) by mouth at bedtime. 60 capsule 11  . Insulin Infusion Pump Supplies (INSET INFUSION SET 23" 6MM) MISC 1 Device by Does not apply route every 3 (three) days.    . Insulin Infusion Pump Supplies (INSULIN PUMP SYRINGE RESERVOIR) MISC 1 Device by Does not apply route every 3 (three) days. Animas 2 ml    . metoprolol (LOPRESSOR) 100 MG tablet TAKE 1 TABLET (100 MG TOTAL) BY MOUTH 2 (TWO) TIMES DAILY. 60 tablet 11  . omeprazole (PRILOSEC) 40 MG capsule Take 1 capsule (40 mg total) by mouth daily before breakfast. 90 capsule 3  . pravastatin (PRAVACHOL) 20 MG tablet Take 20 mg by mouth daily.    . Probiotic Product (PROBIOTIC PO) Take 1 tablet by mouth daily at 10 pm.    . traMADol (ULTRAM) 50 MG tablet Take 2 tablets (100 mg total) by mouth every 6 (six) hours as needed. for pain 120 tablet 5  . valsartan (DIOVAN) 320 MG tablet Take 320 mg by mouth daily.    .  Azelastine-Fluticasone (DYMISTA) 137-50 MCG/ACT SUSP Place 1 Act into the nose 2 (two) times daily. 1 Bottle 11  . Dietary Management Product (VASCULERA) TABS Take 1 capsule by mouth daily. (Patient not taking: Reported on 10/06/2015) 30 tablet 11  . insulin lispro (HUMALOG) 100 UNIT/ML injection Inject via insulin pump for a total of 70 units per day 70 mL 3   No facility-administered medications prior to visit.    ROS Review of Systems  Constitutional: Negative.  Negative for fever, chills, diaphoresis, appetite change and fatigue.  HENT: Negative.  Negative for congestion, facial swelling, sinus pressure, sore throat, tinnitus and trouble swallowing.   Eyes: Negative.   Respiratory: Positive for cough, shortness of breath and wheezing. Negative for apnea, choking, chest tightness and stridor.   Cardiovascular: Negative.  Negative for chest pain, palpitations and leg swelling.  Gastrointestinal: Negative.  Negative for nausea, vomiting, abdominal pain, diarrhea and constipation.  Endocrine: Negative.   Genitourinary: Negative.   Musculoskeletal: Negative.  Negative for myalgias, back pain, joint swelling, arthralgias and neck pain.  Skin: Positive for wound. Negative for color change, pallor and rash.  Allergic/Immunologic: Negative.   Neurological: Negative.  Negative for dizziness, tremors, syncope, light-headedness,  numbness and headaches.  Hematological: Negative.  Negative for adenopathy. Does not bruise/bleed easily.  Psychiatric/Behavioral: Negative.     Objective:  BP 120/60 mmHg  Pulse 73  Temp(Src) 97.7 F (36.5 C) (Oral)  Resp 20  Wt 145 lb (65.772 kg)  SpO2 94%  BP Readings from Last 3 Encounters:  10/06/15 120/60  09/06/15 120/58  08/31/15 128/62    Wt Readings from Last 3 Encounters:  10/06/15 145 lb (65.772 kg)  09/06/15 146 lb (66.225 kg)  08/25/15 138 lb 14.2 oz (63 kg)    Physical Exam  Constitutional: [name] oriented to person, place, and time. She  appears well-developed and well-nourished.  Non-toxic appearance. She does not have a sickly appearance. She does not appear ill. No distress.  HENT:  Mouth/Throat: Oropharynx is clear and moist. No oropharyngeal exudate.  Eyes: Conjunctivae are normal. Right eye exhibits no discharge. Left eye exhibits no discharge. No scleral icterus.  Neck: Normal range of motion. Neck supple. No JVD present. No tracheal deviation present. No thyromegaly present.  Cardiovascular: Normal rate, regular rhythm and intact distal pulses.  Exam reveals no gallop and no friction rub.   Murmur heard. Pulmonary/Chest: Effort normal. No accessory muscle usage or stridor. No respiratory distress. She has no decreased breath sounds. She has no wheezes. She has rhonchi in the right middle field, the right lower field, the left middle field and the left lower field. She has no rales. She exhibits no tenderness.  Abdominal: Soft. Bowel sounds are normal. She exhibits no distension and no mass. There is no tenderness. There is no rebound and no guarding.  Musculoskeletal: Normal range of motion. She exhibits no edema or tenderness.  Lymphadenopathy:    She has no cervical adenopathy.  Neurological: [name] oriented to person, place, and time.  Skin: Skin is warm and dry. Lesion noted. No rash noted. [name] not diaphoretic. No erythema.     Vitals reviewed.   Lab Results  Component Value Date   WBC 6.3 08/31/2015   HGB 13.3 08/31/2015   HCT 40.8 08/31/2015   PLT 218.0 08/31/2015   GLUCOSE 202* 08/31/2015   CHOL 117 08/31/2015   TRIG 133.0 08/31/2015   HDL 48.80 08/31/2015   LDLCALC 42 08/31/2015   ALT 23 06/27/2014   AST 28 06/27/2014   NA 140 08/31/2015   K 5.1 08/31/2015   CL 101 08/31/2015   CREATININE 1.56* 08/31/2015   BUN 57* 08/31/2015   CO2 33* 08/31/2015   TSH 1.90 08/31/2015   INR 1.18 12/25/2011   HGBA1C 8.0* 08/31/2015   MICROALBUR 2.0* 08/31/2015    Dg Chest 2 View  08/24/2015  CLINICAL  DATA:  Chest tightness for 2 days with shortness of breath. EXAM: CHEST  2 VIEW COMPARISON:  06/27/2014 FINDINGS: Mild enlargement of the cardiac silhouette. Chronic linear densities at the left lung base could represent scarring. Otherwise, the lungs are clear. No pulmonary edema. Atherosclerotic calcifications in the thoracic aorta. No large pleural effusions. Degenerative changes at the thoracolumbar junction. IMPRESSION: No active cardiopulmonary disease. Electronically Signed   By: Markus Daft M.D.   On: 08/24/2015 17:45    Assessment & Plan:   Breion was seen today for cough.  Diagnoses and all orders for this visit:  Decubitus ulcer of buttock, stage 1, unspecified laterality- I have sent a referral to home health care for her to have wound care therapy. -     Ambulatory referral to Home Health  Cough- her chest  x-ray is normal, I will treat her for infection as well as asthmatic bronchitis -     DG Chest 2 View; Future -     Ipratropium-Albuterol (COMBIVENT RESPIMAT) 20-100 MCG/ACT AERS respimat; Inhale 1 puff into the lungs every 6 (six) hours as needed for wheezing.  Lower resp. tract infection- will treat the infection with Augmentin, she will try Phenergan DM for the cough, I have asked her to use Combivent Respimat at as needed for the cough and wheezing. -     Ipratropium-Albuterol (COMBIVENT RESPIMAT) 20-100 MCG/ACT AERS respimat; Inhale 1 puff into the lungs every 6 (six) hours as needed for wheezing. -     amoxicillin-clavulanate (AUGMENTIN) 875-125 MG tablet; Take 1 tablet by mouth 2 (two) times daily. -     promethazine-dextromethorphan (PROMETHAZINE-DM) 6.25-15 MG/5ML syrup; Take 5 mLs by mouth 4 (four) times daily as needed for cough.  Diabetes mellitus type 1 with peripheral artery disease (HCC) -     insulin lispro (HUMALOG) 100 UNIT/ML injection; Inject via insulin pump for a total of 70 units per day  I have discontinued Ms. Derden's VASCULERA and  Azelastine-Fluticasone. I am also having her start on Ipratropium-Albuterol, amoxicillin-clavulanate, and promethazine-dextromethorphan. Additionally, I am having her maintain her vitamin C, Vitamin D3, b complex vitamins, fish oil-omega-3 fatty acids, aspirin EC, Probiotic Product (PROBIOTIC PO), Insulin Pump Syringe Reservoir, INSET INFUSION SET 23" 6MM, valsartan, omeprazole, metoprolol, gabapentin, pravastatin, traMADol, furosemide, and insulin lispro.  Meds ordered this encounter  Medications  . Ipratropium-Albuterol (COMBIVENT RESPIMAT) 20-100 MCG/ACT AERS respimat    Sig: Inhale 1 puff into the lungs every 6 (six) hours as needed for wheezing.    Dispense:  4 g    Refill:  11  . amoxicillin-clavulanate (AUGMENTIN) 875-125 MG tablet    Sig: Take 1 tablet by mouth 2 (two) times daily.    Dispense:  20 tablet    Refill:  0  . promethazine-dextromethorphan (PROMETHAZINE-DM) 6.25-15 MG/5ML syrup    Sig: Take 5 mLs by mouth 4 (four) times daily as needed for cough.    Dispense:  118 mL    Refill:  0  . insulin lispro (HUMALOG) 100 UNIT/ML injection    Sig: Inject via insulin pump for a total of 70 units per day    Dispense:  70 mL    Refill:  3     Follow-up: Return in about 3 weeks (around 10/27/2015).  [name], MD

## 2015-10-10 ENCOUNTER — Other Ambulatory Visit: Payer: Self-pay | Admitting: Internal Medicine

## 2015-10-10 NOTE — Telephone Encounter (Signed)
Please advise, thanks.

## 2015-10-11 ENCOUNTER — Other Ambulatory Visit: Payer: Self-pay | Admitting: Internal Medicine

## 2015-10-11 ENCOUNTER — Ambulatory Visit: Payer: Medicare Other | Admitting: Endocrinology

## 2015-10-12 ENCOUNTER — Telehealth: Payer: Self-pay | Admitting: Internal Medicine

## 2015-10-12 DIAGNOSIS — R05 Cough: Secondary | ICD-10-CM

## 2015-10-12 DIAGNOSIS — J22 Unspecified acute lower respiratory infection: Secondary | ICD-10-CM

## 2015-10-12 DIAGNOSIS — R059 Cough, unspecified: Secondary | ICD-10-CM

## 2015-10-12 MED ORDER — AMOXICILLIN 875 MG PO TABS: 875.0000 mg | ORAL_TABLET | Freq: Two times a day (BID) | ORAL | Status: AC

## 2015-10-12 NOTE — Telephone Encounter (Signed)
[facility]  Advanced Heart Failure and Transplant  Heart Transplant Clinic  Follow-up Visit    Primary Care Physician: Brodsky, Mark E  Referring Provider: Brett Justin Berman  Date of Transplant: 10/09/2019  Organ(s) Transplanted: heart  Indication for transplant: Dilated Myopathy: Idiopathic  PHS increased risk donor: Yes    ID. 82 year old female with end-stage HFrEF 2/2 NICM s/p OHT 10/09/19, history of 2R, HTN, HLD and anxiety coming in for f/u of heart transplant.    Interval History:    The patient was last seen on 12/04/21. At that time issues with pain after urologic procedure.    He continues to deal with pain issue largely from prostate surgery. He still has some bleeding and some tissue come out. He tried different strategies and nothing helped. This is really impacting quality of life. He gets tired and frustrated and does not want to take it out on his family.    ROS:  A complete ROS was performed and is negative except as documented in the HPI.      Allergies:  Patient is allergic to cats [other] and dogs [other].    Past Medical History:   Diagnosis Date    Asthma     Atrial fibrillation (CMS-HCC)     Chronic HFrEF (heart failure with reduced ejection fraction) (CMS-HCC)     GERD (gastroesophageal reflux disease)     HTN (hypertension)     Insomnia     Nephrolithiasis     Sinusitis      Patient Active Problem List   Diagnosis    COPD (chronic obstructive pulmonary disease) (CMS-HCC)    Heart transplant, orthotopic, 10/09/2019    Pericardial effusion    Hypertension    Chronic back pain    At risk for infection transmitted from donor    Acute hepatitis C virus infection    Heart transplanted (CMS-HCC)    Acute UTI    Umbilical hernia without obstruction and without gangrene    COVID-19 virus detected    Acute medial meniscus tear of left knee, sequela    Localized osteoarthritis of left knee     Past Surgical History:   Procedure Laterality Date    CARDIAC DEFIBRILLATOR PLACEMENT       PB ANESTH,SHOULDER JOINT,NOS Right      Family History   Problem Relation Name Age of Onset    Hypertension Other      Other Maternal Grandmother          kidney disease needing HD     Social History     Socioeconomic History    Marital status: Single     Spouse name: Not on file    Number of children: Not on file    Years of education: Not on file    Highest education level: Not on file   Occupational History    Not on file   Tobacco Use    Smoking status: Never    Smokeless tobacco: Never    Tobacco comments:     from friends and relatives    Substance and Sexual Activity    Alcohol use: Not Currently     Comment: Prior heavier use, but completely quit in 2016    Drug use: Yes     Comment: eats edible marijuana for pain and insomnia     Sexual activity: Not on file   Other Topics Concern    Not on file   Social History Narrative      Born in [place], also lived in [place], [place], [place], no travel, worked as a carpenter, occasional cedar, no birds, no hot tubs, worked in construction + possible asbestos exposure      Social Determinants of Health     Financial Resource Strain: Not on file   Food Insecurity: Not on file   Transportation Needs: Not on file   Physical Activity: Not on file   Stress: Not on file   Social Connections: Not on file   Intimate Partner Violence: Not on file   Housing Stability: Not on file     Current Outpatient Medications   Medication Sig    albuterol 108 (90 Base) MCG/ACT inhaler Inhale 2 puffs by mouth every 4 hours as needed for Wheezing or Shortness of Breath.    aspirin 81 MG EC tablet Take 1 tablet (81 mg) by mouth daily.    baclofen (LIORESAL) 10 MG tablet Take 2 tablets (20 mg) by mouth nightly.    Blood Glucose Monitoring Suppl (TRUE METRIX METER) w/Device KIT Use as directed    budesonide-formoterol (SYMBICORT) 160-4.5 MCG/ACT inhaler Inhale 2 puffs by mouth every 12 hours.    bumetanide (BUMEX) 1 MG tablet Take 1 tablet (1 mg) by mouth daily as needed (fluid/weight  gain). Do not take unless instructed by Transplant team.    Calcium Carb-Cholecalciferol 600-10 MG-MCG TABS Take 1 tablet by mouth 2 times daily.    Cetirizine HCl (ZERVIATE) 0.24 % SOLN Place 1 drop into both eyes 2 times daily.    clindamycin (CLEOCIN T) 1 % solution Apply 1 Application. topically 2 times daily. Apply to the red bumps on your face up to two times a day.    controlled substance agreement controlled substance agreement    diclofenac (VOLTAREN) 1 % gel Apply 2 g topically 4 times daily.    docusate sodium (COLACE) 100 MG capsule Take 1 capsule (100 mg) by mouth 2 times daily.    DULoxetine (CYMBALTA) 30 MG CR capsule Take 1 capsule (30 mg) by mouth daily.    famotidine (PEPCID) 20 MG tablet Take 1 tablet (20 mg) by mouth 2 times daily.    fluticasone propionate (FLONASE) 50 MCG/ACT nasal spray Spray 1 spray into each nostril 2 times daily.    gabapentin (NEURONTIN) 300 MG capsule Take 1 capsule (300 mg) by mouth every morning AND 1 capsule (300 mg) daily AND 2 capsules (600 mg) every evening.    hydroCHLOROthiazide (HYDRODIURIL) 25 MG tablet Take 1 tablet (25 mg) by mouth daily.    ketoconazole (NIZORAL) 2 % shampoo Use shampoo daily for dandruff    lidocaine (LIDOCAINE PAIN RELIEF) 4 % patch Apply 1 patch topically every 24 hours. Leave patch on for 12 hours, then remove for 12 hours.    lisinopril (PRINIVIL, ZESTRIL) 10 MG tablet Take 2 tablets (20 mg) by mouth daily.    magnesium oxide (MAG-OX) 400 MG tablet Take 1 tablet by mouth daily    melatonin (GNP MELATONIN MAXIMUM STRENGTH) 5 MG tablet Take 2 tablets (10 mg) by mouth at bedtime.    Multiple Vitamin (MULTIVITAMIN) TABS tablet Take 1 tablet by mouth daily.    naloxone (KLOXXADO) 8 mg/0.1 mL nasal spray Call 911! Tilt head and spray intranasally into one nostril as needed for respiratory depression. If patient does not respond or responds and then relapses, repeat using a new nasal spray every 3 minutes until emergency medical assistance  arrives.    NEEDLE, DISP, 25 G 25G X   1" MISC Use to inject testosterone    NIFEdipine (ADALAT CC) 30 MG Controlled-Release tablet Take 1 tablet (30 mg) by mouth nightly.    ondansetron (ZOFRAN) 8 MG tablet Take 1 tablet (8 mg) by mouth every 8 hours as needed for Nausea/Vomiting.    oxyCODONE (ROXICODONE) 10 MG tablet Take 1 tab every 4 hours as needed for moderate pain and 2 tabs every 4 hours as needed for severe pain. Max 10 tabs per day, 28 day supply    phenazopyridine (PYRIDIUM) 100 MG tablet Take 1 tablet (100 mg) by mouth 3 times daily.    polyethylene glycol (GLYCOLAX) 17 GM/SCOOP powder Mix 17 grams in 4-8 oz of liquide and drink by mouth daily as needed (Constipation).    pravastatin (PRAVACHOL) 40 MG tablet Take 1 tablet (40 mg) by mouth every evening.    senna (SENOKOT) 8.6 MG tablet Take 1 tablet (8.6 mg) by mouth daily.    sirolimus (RAPAMUNE) 1 MG tablet Take 2 tablets (2 mg) by mouth every morning.    SYRINGE-NEEDLE, DISP, 3 ML (B-D 3CC LUER-LOK SYR 25GX1") 25G X 1" 3 ML MISC Use as directed to inject testosterone    SYRINGE-NEEDLE, DISP, 3 ML 18G X 1-1/2" 3 ML MISC Use to draw up testosterone    tacrolimus (ENVARSUS XR) 1 MG tablet STOP TAKING since 05/02/22 - remaining on chart for dose adjustments, titratable med.    tacrolimus (ENVARSUS XR) 4 MG tablet Take 1 tablet (4 mg) by mouth every morning.    tamsulosin (FLOMAX) 0.4 MG capsule Take 1 capsule (0.4 mg) by mouth daily.    tamsulosin (FLOMAX) 0.4 MG capsule Take 1 capsule (0.4 mg) by mouth daily.    testosterone cypionate (DEPO-TESTOSTERONE) 200 MG/ML SOLN Inject 1 ml into the muscle every 14 days    traZODone (DESYREL) 50 MG tablet Take 1 tablet (50 mg) by mouth nightly.     Current Facility-Administered Medications   Medication    diphenhydrAMINE (BENADRYL) injection 50 mg    diphenhydrAMINE (BENADRYL) tablet 50 mg     Immunization History   Administered Date(s) Administered    COVID-19 (Moderna) Low Dose Red Cap >= 18 Years 09/30/2020     COVID-19 (Moderna) Red Cap >= 12 Years 11/07/2019, 12/07/2019, 04/06/2020    Hep-A/Hep-B; Twinrix, Adult 10/31/2020    Influenza Vaccine (High Dose) Quadrivalent >=65 Years 06/22/2020    Influenza Vaccine (Unspecified) 04/20/2017    Influenza Vaccine >=6 Months 07/03/2010, 07/03/2011, 08/28/2012, 05/26/2014, 05/09/2018, 05/25/2019    Pneumococcal 13 Vaccine (PREVNAR-13) 06/22/2020    Pneumococcal 23 Vaccine (PNEUMOVAX-23) 07/03/2013, 10/31/2020    Tdap 08/21/2011   Deferred Date(s) Deferred    Pneumococcal 23 Vaccine (PNEUMOVAX-23) 10/22/2019     Physical Exam:  BP 102/69 (BP Location: Right arm, BP Patient Position: Sitting, BP cuff size: Large)   Pulse 98   Temp 98.5 F (36.9 C) (Temporal)   Resp 16   Ht 5' 10" (1.778 m)   Wt 96.2 kg (212 lb)   SpO2 97%   BMI 30.42 kg/m      General Appearance: ***alert, no distress, pleasant affect, cooperative.  Heart:  JVD ***, PMI ***, normal rate and regular rhythm, no murmurs, clicks, or gallops. ***  Lungs: ***clear to auscultation and percussion. No rales, rhonchi, or wheezes noted. No chest deformities noted.  Abdomen: ***BS normal.  Abdomen soft, non-tender.  No masses or organomegaly.  Extremities:  ***no cyanosis, clubbing, or edema. Has 2+ peripheral pulses.        Lab Data:  Lab Results   Component Value Date    BUN 26 (H) 05/02/2022    CREAT 1.98 (H) 05/02/2022    CL 99 05/02/2022    NA 140 05/02/2022    K 4.4 05/02/2022    CA 9.2 05/02/2022    TBILI 0.47 05/02/2022    ALB 4.1 05/02/2022    TP 7.1 05/02/2022    AST 22 05/02/2022    ALK 76 05/02/2022    BICARB 29 05/02/2022    ALT 25 05/02/2022    GLU 126 (H) 05/02/2022     Lab Results   Component Value Date    WBC 7.9 05/02/2022    RBC 5.50 05/02/2022    HGB 15.2 05/02/2022    HCT 46.5 05/02/2022    MCV 84.5 05/02/2022    MCHC 32.7 05/02/2022    RDW 12.3 05/02/2022    PLT 162 05/02/2022    MPV 11.6 05/02/2022     Lab Results   Component Value Date    A1C 5.7 09/29/2021     Lab Results   Component Value Date     TSH 1.63 09/29/2021     Lab Results   Component Value Date    CHOL 105 09/29/2021    HDL 38 09/29/2021    LDLCALC 43 09/29/2021    TRIG 121 09/29/2021     Lab Results   Component Value Date    SIROT 11.5 05/02/2022     Lab Results   Component Value Date    FKTR 6.5 05/02/2022     No results found for: CSATR  Lab Results   Component Value Date    CMVPL Not Detected 07/21/2021     Lab Results   Component Value Date    DSA ABSENT 05/02/2022       Prior Cardiovascular Studies:   Lab Results   Component Value Date    LV Ejection Fraction 59 10/26/2021          Echo 10/26/21  Summary:   1. The left ventricular size is normal. The left ventricular systolic function is normal.   2. No left ventricular hypertrophy.   3. Normal pattern of left ventricular diastolic filling.   4. EF=59%.   5. Compared to prior study EF now 59%, was 69% 11/18/20.     LHC/IVUS 10/24/21  CONCLUSION:                                                                   1. Myocardial bridging with mild systolic compression of the mid segment    of the left anterior descending coronary artery.                              2. No angiographic evidence of coronary artery disease.                      3. Intimal thickness noted in LAD/LM up to 0.5 mm (Stable to slightly       worse compare to 2022).                                                         4. Non significant FFR at apical LAD.                                        5. Left ventricular end diastolic pressure appears normal.        Assessment summary:  82 year old female with end-stage HFrEF 2/2 NICM s/p OHT 10/09/19, history of 2R, HTN, HLD and anxiety coming in for f/u of heart transplant.    Assessment/Plan:  # Hematuria  # Dysuria  # Chronic pain  Assessment: We had a long frank discussion about patient's chronic pain issues and the heart transplant team's role in this. I discussed with him that when I initially agreed to cover his chronic opiate prescription, this was the assumption that he would  have a provider versed in chronic pain after 3-4 months, but we are at 6 months and has unable to find one. Additionally, I had not put him on a pain contract at that time, but he recently used more opiates without asking and I informed him this was not appropriate, but because he had not established guidelines I was not going to stop at this time. However, going forward until he can establish with a pain physician, we will set up a pain contract and he will need to follow through like a usual pain clinic with us with goal of provider in 3-4 months or I may start tapering. I will augment adjuvant agents additionally for now and we can continue to work on this.  Plan:  -pain contract signed  -urine tox monthly  -clinic follow up month  -oxycodone 10 mg tablets PO, 1 tab every 4 hours moderate pain, 2 tabs every 4 hours for severe pain, no more than 10 tablets a day, total 280 per 28 days.   -diclofenac cream for joint pain  -lidocaine patch for back pain  -trial of pyridium  -increase gaba at night  -siro change as below  -cymbalta as below    # End-stage heart failure s/p orthotopic heart transplant  # Chronic Immunosuppression/Immunomodulation  Assessment: While we thought continuing sirolimus would help prevent recurrent scar tissue from prostate procedure, it may be exacerbating factors now with delayed wound healing. Will try mmf for 1 month.  Plan:   - continue envarsus 6 mg daily, goal trough 4-8  - HOLD sirolimus 3 mg daily, goal trough 4-8 for at least 1 month  - start mmf 1000 mg bid for one month to allow healing  - Continue to monitor for renal toxicities, infection risk and malignancy risk  - continue pravastatin 40 mg daily  - continue aspirin 81 mg daily    # Hypertension  Assessment: controlled  Plan:  -continue lisinopril 20 mg daily  -resume hctz  -nifedipine 30 mg daily    # Dyslipidemia  -continue pravastatin 40 mg daily    # Depression  Assessment: improved mood  Plan:  -increase cymbalta to 120  mg daily     RTC in 1 month       Nicholas W Wettersten, MD  Advanced Heart Failure, Mechanical Circulatory Support, Transplant  Pgr: [phone]

## 2015-10-12 NOTE — Telephone Encounter (Signed)
Asked pt if she was taking on a full stomach, stated she had not paid attention but is now having diarrhea. Please advise on alternative

## 2015-10-12 NOTE — Telephone Encounter (Signed)
Pt called state that Dr. Ronnald Ramp gave her amoxicillin-clavulanate (AUGMENTIN) 875-125 MG and she having diarrhea since she takes this med. Please advise.

## 2015-10-15 ENCOUNTER — Other Ambulatory Visit: Payer: Self-pay | Admitting: Internal Medicine

## 2015-10-19 DIAGNOSIS — J22 Unspecified acute lower respiratory infection: Secondary | ICD-10-CM | POA: Diagnosis not present

## 2015-10-19 DIAGNOSIS — L89320 Pressure ulcer of left buttock, unstageable: Secondary | ICD-10-CM | POA: Diagnosis not present

## 2015-10-19 DIAGNOSIS — I5040 Unspecified combined systolic (congestive) and diastolic (congestive) heart failure: Secondary | ICD-10-CM | POA: Diagnosis not present

## 2015-10-19 DIAGNOSIS — E1051 Type 1 diabetes mellitus with diabetic peripheral angiopathy without gangrene: Secondary | ICD-10-CM | POA: Diagnosis not present

## 2015-10-19 DIAGNOSIS — I110 Hypertensive heart disease with heart failure: Secondary | ICD-10-CM | POA: Diagnosis not present

## 2015-10-19 DIAGNOSIS — L89310 Pressure ulcer of right buttock, unstageable: Secondary | ICD-10-CM | POA: Diagnosis not present

## 2015-10-19 DIAGNOSIS — J45909 Unspecified asthma, uncomplicated: Secondary | ICD-10-CM | POA: Diagnosis not present

## 2015-10-19 DIAGNOSIS — Z9181 History of falling: Secondary | ICD-10-CM | POA: Diagnosis not present

## 2015-10-19 DIAGNOSIS — Z96641 Presence of right artificial hip joint: Secondary | ICD-10-CM | POA: Diagnosis not present

## 2015-10-19 DIAGNOSIS — M1990 Unspecified osteoarthritis, unspecified site: Secondary | ICD-10-CM | POA: Diagnosis not present

## 2015-10-19 DIAGNOSIS — Z9641 Presence of insulin pump (external) (internal): Secondary | ICD-10-CM | POA: Diagnosis not present

## 2015-10-19 DIAGNOSIS — I252 Old myocardial infarction: Secondary | ICD-10-CM | POA: Diagnosis not present

## 2015-10-23 ENCOUNTER — Other Ambulatory Visit (HOSPITAL_COMMUNITY): Payer: Self-pay | Admitting: Internal Medicine

## 2015-10-24 DIAGNOSIS — M1990 Unspecified osteoarthritis, unspecified site: Secondary | ICD-10-CM | POA: Diagnosis not present

## 2015-10-24 DIAGNOSIS — J22 Unspecified acute lower respiratory infection: Secondary | ICD-10-CM | POA: Diagnosis not present

## 2015-10-24 DIAGNOSIS — L89320 Pressure ulcer of left buttock, unstageable: Secondary | ICD-10-CM | POA: Diagnosis not present

## 2015-10-24 DIAGNOSIS — L89310 Pressure ulcer of right buttock, unstageable: Secondary | ICD-10-CM | POA: Diagnosis not present

## 2015-10-24 DIAGNOSIS — J45909 Unspecified asthma, uncomplicated: Secondary | ICD-10-CM | POA: Diagnosis not present

## 2015-10-24 DIAGNOSIS — Z96641 Presence of right artificial hip joint: Secondary | ICD-10-CM | POA: Diagnosis not present

## 2015-10-24 DIAGNOSIS — Z9641 Presence of insulin pump (external) (internal): Secondary | ICD-10-CM | POA: Diagnosis not present

## 2015-10-24 DIAGNOSIS — E1051 Type 1 diabetes mellitus with diabetic peripheral angiopathy without gangrene: Secondary | ICD-10-CM | POA: Diagnosis not present

## 2015-10-24 DIAGNOSIS — Z9181 History of falling: Secondary | ICD-10-CM | POA: Diagnosis not present

## 2015-10-24 DIAGNOSIS — I5040 Unspecified combined systolic (congestive) and diastolic (congestive) heart failure: Secondary | ICD-10-CM | POA: Diagnosis not present

## 2015-10-24 DIAGNOSIS — I110 Hypertensive heart disease with heart failure: Secondary | ICD-10-CM | POA: Diagnosis not present

## 2015-10-24 DIAGNOSIS — I252 Old myocardial infarction: Secondary | ICD-10-CM | POA: Diagnosis not present

## 2015-10-25 ENCOUNTER — Encounter: Payer: Self-pay | Admitting: Internal Medicine

## 2015-10-25 ENCOUNTER — Telehealth: Payer: Self-pay | Admitting: *Deleted

## 2015-10-25 ENCOUNTER — Ambulatory Visit (INDEPENDENT_AMBULATORY_CARE_PROVIDER_SITE_OTHER): Payer: Medicare Other | Admitting: Internal Medicine

## 2015-10-25 VITALS — BP 120/68 | HR 67 | Temp 98.2°F | Resp 16 | Ht 64.0 in | Wt 145.0 lb

## 2015-10-25 DIAGNOSIS — I10 Essential (primary) hypertension: Secondary | ICD-10-CM

## 2015-10-25 DIAGNOSIS — L89301 Pressure ulcer of unspecified buttock, stage 1: Secondary | ICD-10-CM | POA: Diagnosis not present

## 2015-10-25 NOTE — Patient Instructions (Signed)

## 2015-10-25 NOTE — Telephone Encounter (Signed)
yes

## 2015-10-25 NOTE — Telephone Encounter (Signed)
Left msg on triage stating went out to evaluate pt. Requesting verbal for the plan of care  1x a week for 1 week, 2x's week for 1 wk, 1x wk for 1 wk for continue obrservation to wound care...Emma Yu

## 2015-10-25 NOTE — Telephone Encounter (Signed)
Notified Amanda w/MD response.../lmb 

## 2015-10-25 NOTE — Progress Notes (Signed)
Pre visit review using our clinic review tool, if applicable. No additional management support is needed unless otherwise documented below in the visit note. 

## 2015-10-26 DIAGNOSIS — I5040 Unspecified combined systolic (congestive) and diastolic (congestive) heart failure: Secondary | ICD-10-CM | POA: Diagnosis not present

## 2015-10-26 DIAGNOSIS — L89310 Pressure ulcer of right buttock, unstageable: Secondary | ICD-10-CM | POA: Diagnosis not present

## 2015-10-26 DIAGNOSIS — J45909 Unspecified asthma, uncomplicated: Secondary | ICD-10-CM | POA: Diagnosis not present

## 2015-10-26 DIAGNOSIS — I252 Old myocardial infarction: Secondary | ICD-10-CM | POA: Diagnosis not present

## 2015-10-26 DIAGNOSIS — I110 Hypertensive heart disease with heart failure: Secondary | ICD-10-CM | POA: Diagnosis not present

## 2015-10-26 DIAGNOSIS — J22 Unspecified acute lower respiratory infection: Secondary | ICD-10-CM | POA: Diagnosis not present

## 2015-10-26 DIAGNOSIS — Z96641 Presence of right artificial hip joint: Secondary | ICD-10-CM | POA: Diagnosis not present

## 2015-10-26 DIAGNOSIS — M1990 Unspecified osteoarthritis, unspecified site: Secondary | ICD-10-CM | POA: Diagnosis not present

## 2015-10-26 DIAGNOSIS — Z9181 History of falling: Secondary | ICD-10-CM | POA: Diagnosis not present

## 2015-10-26 DIAGNOSIS — E1051 Type 1 diabetes mellitus with diabetic peripheral angiopathy without gangrene: Secondary | ICD-10-CM | POA: Diagnosis not present

## 2015-10-26 DIAGNOSIS — L89320 Pressure ulcer of left buttock, unstageable: Secondary | ICD-10-CM | POA: Diagnosis not present

## 2015-10-26 DIAGNOSIS — Z9641 Presence of insulin pump (external) (internal): Secondary | ICD-10-CM | POA: Diagnosis not present

## 2015-10-26 NOTE — Progress Notes (Signed)
Subjective:  Patient ID: Emma Yu, female    DOB: 23-Jan-1927  Age: 82 y.o. MRN: [id number]  CC: Diabetes and Asthma   HPI Emma Yu presents for f/up after a recent URI with bronchitis and wheezing - she feels much better with no more coughing or wheezing, the inhaler has helped significantly. [name] being seen by [facility] for the decub ulcers.   Outpatient Prescriptions Prior to Visit  Medication Sig Dispense Refill  . Ascorbic Acid (VITAMIN C) 500 MG tablet Take 500 mg by mouth daily.      Marland Kitchen aspirin EC 81 MG tablet Take 81 mg by mouth 2 (two) times daily.    Marland Kitchen b complex vitamins tablet Take 1 tablet by mouth daily.      . Cholecalciferol (VITAMIN D3) 2000 UNITS capsule Take 2,000 Units by mouth daily.      . fish oil-omega-3 fatty acids 1000 MG capsule Take 2 g by mouth 2 (two) times daily.     . furosemide (LASIX) 20 MG tablet TAKE 3 TABLETS (60 MG TOTAL) BY MOUTH DAILY. 90 tablet 6  . gabapentin (NEURONTIN) 300 MG capsule Take 2 capsules (600 mg total) by mouth at bedtime. 60 capsule 11  . Insulin Infusion Pump Supplies (INSET INFUSION SET 23" 6MM) MISC 1 Device by Does not apply route every 3 (three) days.    . Insulin Infusion Pump Supplies (INSULIN PUMP SYRINGE RESERVOIR) MISC 1 Device by Does not apply route every 3 (three) days. Animas 2 ml    . insulin lispro (HUMALOG) 100 UNIT/ML injection Inject via insulin pump for a total of 70 units per day 70 mL 3  . Ipratropium-Albuterol (COMBIVENT RESPIMAT) 20-100 MCG/ACT AERS respimat Inhale 1 puff into the lungs every 6 (six) hours as needed for wheezing. 4 g 11  . metoprolol (LOPRESSOR) 100 MG tablet TAKE 1 TABLET (100 MG TOTAL) BY MOUTH 2 (TWO) TIMES DAILY. 60 tablet 11  . omeprazole (PRILOSEC) 40 MG capsule Take 1 capsule (40 mg total) by mouth daily before breakfast. 90 capsule 3  . pravastatin (PRAVACHOL) 20 MG tablet Take 20 mg by mouth daily.    . Probiotic Product (PROBIOTIC PO) Take 1 tablet by mouth daily at 10  pm.    . traMADol (ULTRAM) 50 MG tablet TAKE 2 TABLETS BY MOUTH EVERY 6 HOURS AS NEEDED FOR PAIN 120 tablet 3  . valsartan (DIOVAN) 320 MG tablet TAKE 1 TABLET (320 MG TOTAL) BY MOUTH AT BEDTIME. 90 tablet 0  . promethazine-dextromethorphan (PROMETHAZINE-DM) 6.25-15 MG/5ML syrup Take 5 mLs by mouth 4 (four) times daily as needed for cough. 118 mL 0  . valsartan (DIOVAN) 320 MG tablet Take 320 mg by mouth daily.     No facility-administered medications prior to visit.    ROS Review of Systems  Constitutional: Negative.  Negative for fever, chills, diaphoresis, appetite change and fatigue.  HENT: Negative.  Negative for sinus pressure and sore throat.   Eyes: Negative.   Respiratory: Negative.  Negative for cough, choking, chest tightness, shortness of breath and stridor.   Cardiovascular: Negative.  Negative for chest pain, palpitations and leg swelling.  Gastrointestinal: Negative.  Negative for nausea, vomiting, abdominal pain, diarrhea and constipation.  Endocrine: Negative.   Genitourinary: Negative.   Musculoskeletal: Positive for back pain and arthralgias. Negative for myalgias and neck pain.  Skin: Negative.  Negative for color change and rash.  Allergic/Immunologic: Negative.   Neurological: Negative.  Negative for dizziness, tremors, light-headedness, numbness and  headaches.  Hematological: Negative.  Negative for adenopathy. Does not bruise/bleed easily.  Psychiatric/Behavioral: Negative.     Objective:  BP 120/68 mmHg  Pulse 67  Temp(Src) 98.2 F (36.8 C) (Oral)  Resp 16  Ht 5\' 4"  (1.626 m)  Wt 145 lb (65.772 kg)  BMI 24.88 kg/m2  SpO2 97%  BP Readings from Last 3 Encounters:  10/25/15 120/68  10/06/15 120/60  09/06/15 120/58    Wt Readings from Last 3 Encounters:  10/25/15 145 lb (65.772 kg)  10/06/15 145 lb (65.772 kg)  09/06/15 146 lb (66.225 kg)    Physical Exam  Constitutional: [name] oriented to person, place, and time. No distress.  HENT:    Mouth/Throat: Oropharynx is clear and moist. No oropharyngeal exudate.  Eyes: Conjunctivae are normal. Right eye exhibits no discharge. Left eye exhibits no discharge. No scleral icterus.  Neck: Normal range of motion. Neck supple. No JVD present. No tracheal deviation present. No thyromegaly present.  Cardiovascular: Normal rate, regular rhythm, S2 normal and intact distal pulses.  Exam reveals no gallop and no friction rub.   Murmur heard.  Systolic murmur is present with a grade of 1/6   No diastolic murmur is present  Pulmonary/Chest: Effort normal and breath sounds normal. No stridor. No respiratory distress. She has no wheezes. She has no rales. She exhibits no tenderness.  Abdominal: Soft. Bowel sounds are normal. She exhibits no distension and no mass. There is no tenderness. There is no rebound and no guarding.  Musculoskeletal: Normal range of motion. She exhibits no edema.  Lymphadenopathy:    She has no cervical adenopathy.  Neurological: [name] oriented to person, place, and time.  Skin: Skin is warm and dry. No rash noted. [name] not diaphoretic. No erythema. No pallor.  Vitals reviewed.   Lab Results  Component Value Date   WBC 6.3 08/31/2015   HGB 13.3 08/31/2015   HCT 40.8 08/31/2015   PLT 218.0 08/31/2015   GLUCOSE 202* 08/31/2015   CHOL 117 08/31/2015   TRIG 133.0 08/31/2015   HDL 48.80 08/31/2015   LDLCALC 42 08/31/2015   ALT 23 06/27/2014   AST 28 06/27/2014   NA 140 08/31/2015   K 5.1 08/31/2015   CL 101 08/31/2015   CREATININE 1.56* 08/31/2015   BUN 57* 08/31/2015   CO2 33* 08/31/2015   TSH 1.90 08/31/2015   INR 1.18 12/25/2011   HGBA1C 8.0* 08/31/2015   MICROALBUR 2.0* 08/31/2015    Dg Chest 2 View  10/06/2015  CLINICAL DATA:  Cough and shortness of breath for 10 days. EXAM: CHEST  2 VIEW COMPARISON:  August 24, 2015. FINDINGS: Stable cardiomediastinal silhouette. No pneumothorax or pleural effusion is noted. No acute pulmonary disease is noted.  Minimal anterior osteophyte formation is noted in lower thoracic spine. Atherosclerosis of thoracic aorta is noted. IMPRESSION: No active cardiopulmonary disease. Electronically Signed   By: Marijo Conception, M.D.   On: 10/06/2015 16:15    Assessment & Plan:   Ashritha was seen today for diabetes and asthma.  Diagnoses and all orders for this visit:  Essential hypertension, benign- her BP is well controlled  Decubitus ulcer of buttock, stage 1, unspecified laterality- tx per [facility]  I have discontinued Ms. Ohagan's promethazine-dextromethorphan. I am also having her maintain her vitamin C, Vitamin D3, b complex vitamins, fish oil-omega-3 fatty acids, aspirin EC, Probiotic Product (PROBIOTIC PO), Insulin Pump Syringe Reservoir, INSET INFUSION SET 23" 6MM, omeprazole, metoprolol, gabapentin, pravastatin, furosemide, Ipratropium-Albuterol, insulin lispro,  traMADol, and valsartan.  No orders of the defined types were placed in this encounter.     Follow-up: Return in about 6 months (around 04/26/2016).  [name], MD

## 2015-10-28 DIAGNOSIS — L89320 Pressure ulcer of left buttock, unstageable: Secondary | ICD-10-CM | POA: Diagnosis not present

## 2015-10-28 DIAGNOSIS — I252 Old myocardial infarction: Secondary | ICD-10-CM | POA: Diagnosis not present

## 2015-10-28 DIAGNOSIS — E1051 Type 1 diabetes mellitus with diabetic peripheral angiopathy without gangrene: Secondary | ICD-10-CM | POA: Diagnosis not present

## 2015-10-28 DIAGNOSIS — I110 Hypertensive heart disease with heart failure: Secondary | ICD-10-CM | POA: Diagnosis not present

## 2015-10-28 DIAGNOSIS — J45909 Unspecified asthma, uncomplicated: Secondary | ICD-10-CM | POA: Diagnosis not present

## 2015-10-28 DIAGNOSIS — M1990 Unspecified osteoarthritis, unspecified site: Secondary | ICD-10-CM | POA: Diagnosis not present

## 2015-10-28 DIAGNOSIS — Z96641 Presence of right artificial hip joint: Secondary | ICD-10-CM | POA: Diagnosis not present

## 2015-10-28 DIAGNOSIS — I5040 Unspecified combined systolic (congestive) and diastolic (congestive) heart failure: Secondary | ICD-10-CM | POA: Diagnosis not present

## 2015-10-28 DIAGNOSIS — Z9641 Presence of insulin pump (external) (internal): Secondary | ICD-10-CM | POA: Diagnosis not present

## 2015-10-28 DIAGNOSIS — J22 Unspecified acute lower respiratory infection: Secondary | ICD-10-CM | POA: Diagnosis not present

## 2015-10-28 DIAGNOSIS — Z9181 History of falling: Secondary | ICD-10-CM | POA: Diagnosis not present

## 2015-10-28 DIAGNOSIS — L89310 Pressure ulcer of right buttock, unstageable: Secondary | ICD-10-CM | POA: Diagnosis not present

## 2015-10-31 DIAGNOSIS — Z9181 History of falling: Secondary | ICD-10-CM | POA: Diagnosis not present

## 2015-10-31 DIAGNOSIS — I252 Old myocardial infarction: Secondary | ICD-10-CM | POA: Diagnosis not present

## 2015-10-31 DIAGNOSIS — M1990 Unspecified osteoarthritis, unspecified site: Secondary | ICD-10-CM | POA: Diagnosis not present

## 2015-10-31 DIAGNOSIS — Z96641 Presence of right artificial hip joint: Secondary | ICD-10-CM | POA: Diagnosis not present

## 2015-10-31 DIAGNOSIS — L89310 Pressure ulcer of right buttock, unstageable: Secondary | ICD-10-CM | POA: Diagnosis not present

## 2015-10-31 DIAGNOSIS — Z9641 Presence of insulin pump (external) (internal): Secondary | ICD-10-CM | POA: Diagnosis not present

## 2015-10-31 DIAGNOSIS — J22 Unspecified acute lower respiratory infection: Secondary | ICD-10-CM | POA: Diagnosis not present

## 2015-10-31 DIAGNOSIS — I110 Hypertensive heart disease with heart failure: Secondary | ICD-10-CM | POA: Diagnosis not present

## 2015-10-31 DIAGNOSIS — J45909 Unspecified asthma, uncomplicated: Secondary | ICD-10-CM | POA: Diagnosis not present

## 2015-10-31 DIAGNOSIS — E1051 Type 1 diabetes mellitus with diabetic peripheral angiopathy without gangrene: Secondary | ICD-10-CM | POA: Diagnosis not present

## 2015-10-31 DIAGNOSIS — I5040 Unspecified combined systolic (congestive) and diastolic (congestive) heart failure: Secondary | ICD-10-CM | POA: Diagnosis not present

## 2015-10-31 DIAGNOSIS — L89320 Pressure ulcer of left buttock, unstageable: Secondary | ICD-10-CM | POA: Diagnosis not present

## 2015-11-02 ENCOUNTER — Other Ambulatory Visit: Payer: Self-pay | Admitting: Internal Medicine

## 2015-11-03 DIAGNOSIS — Z9641 Presence of insulin pump (external) (internal): Secondary | ICD-10-CM | POA: Diagnosis not present

## 2015-11-03 DIAGNOSIS — L89310 Pressure ulcer of right buttock, unstageable: Secondary | ICD-10-CM | POA: Diagnosis not present

## 2015-11-03 DIAGNOSIS — E1051 Type 1 diabetes mellitus with diabetic peripheral angiopathy without gangrene: Secondary | ICD-10-CM | POA: Diagnosis not present

## 2015-11-03 DIAGNOSIS — J22 Unspecified acute lower respiratory infection: Secondary | ICD-10-CM | POA: Diagnosis not present

## 2015-11-03 DIAGNOSIS — J45909 Unspecified asthma, uncomplicated: Secondary | ICD-10-CM | POA: Diagnosis not present

## 2015-11-03 DIAGNOSIS — I5040 Unspecified combined systolic (congestive) and diastolic (congestive) heart failure: Secondary | ICD-10-CM | POA: Diagnosis not present

## 2015-11-03 DIAGNOSIS — I110 Hypertensive heart disease with heart failure: Secondary | ICD-10-CM | POA: Diagnosis not present

## 2015-11-03 DIAGNOSIS — Z96641 Presence of right artificial hip joint: Secondary | ICD-10-CM | POA: Diagnosis not present

## 2015-11-03 DIAGNOSIS — M1990 Unspecified osteoarthritis, unspecified site: Secondary | ICD-10-CM | POA: Diagnosis not present

## 2015-11-03 DIAGNOSIS — I252 Old myocardial infarction: Secondary | ICD-10-CM | POA: Diagnosis not present

## 2015-11-03 DIAGNOSIS — L89320 Pressure ulcer of left buttock, unstageable: Secondary | ICD-10-CM | POA: Diagnosis not present

## 2015-11-03 DIAGNOSIS — Z9181 History of falling: Secondary | ICD-10-CM | POA: Diagnosis not present

## 2015-11-08 ENCOUNTER — Telehealth: Payer: Self-pay | Admitting: Internal Medicine

## 2015-11-08 DIAGNOSIS — J22 Unspecified acute lower respiratory infection: Secondary | ICD-10-CM | POA: Diagnosis not present

## 2015-11-08 DIAGNOSIS — I252 Old myocardial infarction: Secondary | ICD-10-CM | POA: Diagnosis not present

## 2015-11-08 DIAGNOSIS — I5040 Unspecified combined systolic (congestive) and diastolic (congestive) heart failure: Secondary | ICD-10-CM | POA: Diagnosis not present

## 2015-11-08 DIAGNOSIS — L89320 Pressure ulcer of left buttock, unstageable: Secondary | ICD-10-CM | POA: Diagnosis not present

## 2015-11-08 DIAGNOSIS — J45909 Unspecified asthma, uncomplicated: Secondary | ICD-10-CM | POA: Diagnosis not present

## 2015-11-08 DIAGNOSIS — E1051 Type 1 diabetes mellitus with diabetic peripheral angiopathy without gangrene: Secondary | ICD-10-CM | POA: Diagnosis not present

## 2015-11-08 DIAGNOSIS — I110 Hypertensive heart disease with heart failure: Secondary | ICD-10-CM | POA: Diagnosis not present

## 2015-11-08 DIAGNOSIS — Z9641 Presence of insulin pump (external) (internal): Secondary | ICD-10-CM | POA: Diagnosis not present

## 2015-11-08 DIAGNOSIS — M1990 Unspecified osteoarthritis, unspecified site: Secondary | ICD-10-CM | POA: Diagnosis not present

## 2015-11-08 DIAGNOSIS — Z96641 Presence of right artificial hip joint: Secondary | ICD-10-CM | POA: Diagnosis not present

## 2015-11-08 DIAGNOSIS — L89310 Pressure ulcer of right buttock, unstageable: Secondary | ICD-10-CM | POA: Diagnosis not present

## 2015-11-08 DIAGNOSIS — Z9181 History of falling: Secondary | ICD-10-CM | POA: Diagnosis not present

## 2015-11-08 NOTE — Telephone Encounter (Signed)
Would like to extend order for 1 time a week for three weeks and want to apply duoderm to bilateral buttocks wound.

## 2015-11-09 ENCOUNTER — Telehealth: Payer: Self-pay

## 2015-11-09 DIAGNOSIS — I252 Old myocardial infarction: Secondary | ICD-10-CM | POA: Diagnosis not present

## 2015-11-09 DIAGNOSIS — J45909 Unspecified asthma, uncomplicated: Secondary | ICD-10-CM | POA: Diagnosis not present

## 2015-11-09 DIAGNOSIS — L89320 Pressure ulcer of left buttock, unstageable: Secondary | ICD-10-CM | POA: Diagnosis not present

## 2015-11-09 DIAGNOSIS — Z9181 History of falling: Secondary | ICD-10-CM | POA: Diagnosis not present

## 2015-11-09 DIAGNOSIS — L89310 Pressure ulcer of right buttock, unstageable: Secondary | ICD-10-CM | POA: Diagnosis not present

## 2015-11-09 DIAGNOSIS — Z9641 Presence of insulin pump (external) (internal): Secondary | ICD-10-CM | POA: Diagnosis not present

## 2015-11-09 DIAGNOSIS — I5040 Unspecified combined systolic (congestive) and diastolic (congestive) heart failure: Secondary | ICD-10-CM | POA: Diagnosis not present

## 2015-11-09 DIAGNOSIS — I110 Hypertensive heart disease with heart failure: Secondary | ICD-10-CM | POA: Diagnosis not present

## 2015-11-09 DIAGNOSIS — Z96641 Presence of right artificial hip joint: Secondary | ICD-10-CM | POA: Diagnosis not present

## 2015-11-09 DIAGNOSIS — J22 Unspecified acute lower respiratory infection: Secondary | ICD-10-CM | POA: Diagnosis not present

## 2015-11-09 DIAGNOSIS — M1990 Unspecified osteoarthritis, unspecified site: Secondary | ICD-10-CM | POA: Diagnosis not present

## 2015-11-09 DIAGNOSIS — E1051 Type 1 diabetes mellitus with diabetic peripheral angiopathy without gangrene: Secondary | ICD-10-CM | POA: Diagnosis not present

## 2015-11-09 NOTE — Telephone Encounter (Signed)
Verbal given 

## 2015-11-09 NOTE — Telephone Encounter (Signed)
LMOVM

## 2015-11-09 NOTE — Telephone Encounter (Signed)
Home Health Cert/Plan of Care received (10/19/2015 - 12/17/2015) and placed on MD's desk for signature  Face to Face Encounter form also placed on MD's desk for signature

## 2015-11-11 DIAGNOSIS — E109 Type 1 diabetes mellitus without complications: Secondary | ICD-10-CM | POA: Diagnosis not present

## 2015-11-14 DIAGNOSIS — L89310 Pressure ulcer of right buttock, unstageable: Secondary | ICD-10-CM | POA: Diagnosis not present

## 2015-11-14 DIAGNOSIS — Z9181 History of falling: Secondary | ICD-10-CM | POA: Diagnosis not present

## 2015-11-14 DIAGNOSIS — J45909 Unspecified asthma, uncomplicated: Secondary | ICD-10-CM | POA: Diagnosis not present

## 2015-11-14 DIAGNOSIS — Z9641 Presence of insulin pump (external) (internal): Secondary | ICD-10-CM | POA: Diagnosis not present

## 2015-11-14 DIAGNOSIS — E1051 Type 1 diabetes mellitus with diabetic peripheral angiopathy without gangrene: Secondary | ICD-10-CM | POA: Diagnosis not present

## 2015-11-14 DIAGNOSIS — L89320 Pressure ulcer of left buttock, unstageable: Secondary | ICD-10-CM | POA: Diagnosis not present

## 2015-11-14 DIAGNOSIS — I252 Old myocardial infarction: Secondary | ICD-10-CM | POA: Diagnosis not present

## 2015-11-14 DIAGNOSIS — M1990 Unspecified osteoarthritis, unspecified site: Secondary | ICD-10-CM | POA: Diagnosis not present

## 2015-11-14 DIAGNOSIS — I5040 Unspecified combined systolic (congestive) and diastolic (congestive) heart failure: Secondary | ICD-10-CM | POA: Diagnosis not present

## 2015-11-14 DIAGNOSIS — J22 Unspecified acute lower respiratory infection: Secondary | ICD-10-CM | POA: Diagnosis not present

## 2015-11-14 DIAGNOSIS — Z96641 Presence of right artificial hip joint: Secondary | ICD-10-CM | POA: Diagnosis not present

## 2015-11-14 DIAGNOSIS — I110 Hypertensive heart disease with heart failure: Secondary | ICD-10-CM | POA: Diagnosis not present

## 2015-11-16 DIAGNOSIS — I252 Old myocardial infarction: Secondary | ICD-10-CM | POA: Diagnosis not present

## 2015-11-16 DIAGNOSIS — Z96641 Presence of right artificial hip joint: Secondary | ICD-10-CM | POA: Diagnosis not present

## 2015-11-16 DIAGNOSIS — M1990 Unspecified osteoarthritis, unspecified site: Secondary | ICD-10-CM | POA: Diagnosis not present

## 2015-11-16 DIAGNOSIS — E1051 Type 1 diabetes mellitus with diabetic peripheral angiopathy without gangrene: Secondary | ICD-10-CM | POA: Diagnosis not present

## 2015-11-16 DIAGNOSIS — I110 Hypertensive heart disease with heart failure: Secondary | ICD-10-CM | POA: Diagnosis not present

## 2015-11-16 DIAGNOSIS — J22 Unspecified acute lower respiratory infection: Secondary | ICD-10-CM | POA: Diagnosis not present

## 2015-11-16 DIAGNOSIS — J45909 Unspecified asthma, uncomplicated: Secondary | ICD-10-CM | POA: Diagnosis not present

## 2015-11-16 DIAGNOSIS — L89310 Pressure ulcer of right buttock, unstageable: Secondary | ICD-10-CM | POA: Diagnosis not present

## 2015-11-16 DIAGNOSIS — L89320 Pressure ulcer of left buttock, unstageable: Secondary | ICD-10-CM | POA: Diagnosis not present

## 2015-11-16 DIAGNOSIS — Z9641 Presence of insulin pump (external) (internal): Secondary | ICD-10-CM | POA: Diagnosis not present

## 2015-11-16 DIAGNOSIS — I5040 Unspecified combined systolic (congestive) and diastolic (congestive) heart failure: Secondary | ICD-10-CM | POA: Diagnosis not present

## 2015-11-16 DIAGNOSIS — Z9181 History of falling: Secondary | ICD-10-CM | POA: Diagnosis not present

## 2015-11-23 DIAGNOSIS — I110 Hypertensive heart disease with heart failure: Secondary | ICD-10-CM | POA: Diagnosis not present

## 2015-11-23 DIAGNOSIS — L89310 Pressure ulcer of right buttock, unstageable: Secondary | ICD-10-CM | POA: Diagnosis not present

## 2015-11-23 DIAGNOSIS — E1051 Type 1 diabetes mellitus with diabetic peripheral angiopathy without gangrene: Secondary | ICD-10-CM | POA: Diagnosis not present

## 2015-11-23 DIAGNOSIS — J45909 Unspecified asthma, uncomplicated: Secondary | ICD-10-CM | POA: Diagnosis not present

## 2015-11-23 DIAGNOSIS — I5040 Unspecified combined systolic (congestive) and diastolic (congestive) heart failure: Secondary | ICD-10-CM | POA: Diagnosis not present

## 2015-11-23 DIAGNOSIS — Z9181 History of falling: Secondary | ICD-10-CM | POA: Diagnosis not present

## 2015-11-23 DIAGNOSIS — I252 Old myocardial infarction: Secondary | ICD-10-CM | POA: Diagnosis not present

## 2015-11-23 DIAGNOSIS — Z9641 Presence of insulin pump (external) (internal): Secondary | ICD-10-CM | POA: Diagnosis not present

## 2015-11-23 DIAGNOSIS — M1990 Unspecified osteoarthritis, unspecified site: Secondary | ICD-10-CM | POA: Diagnosis not present

## 2015-11-23 DIAGNOSIS — J22 Unspecified acute lower respiratory infection: Secondary | ICD-10-CM | POA: Diagnosis not present

## 2015-11-23 DIAGNOSIS — Z96641 Presence of right artificial hip joint: Secondary | ICD-10-CM | POA: Diagnosis not present

## 2015-11-23 DIAGNOSIS — L89320 Pressure ulcer of left buttock, unstageable: Secondary | ICD-10-CM | POA: Diagnosis not present

## 2015-11-25 DIAGNOSIS — Z9181 History of falling: Secondary | ICD-10-CM | POA: Diagnosis not present

## 2015-11-25 DIAGNOSIS — Z96641 Presence of right artificial hip joint: Secondary | ICD-10-CM | POA: Diagnosis not present

## 2015-11-25 DIAGNOSIS — E1051 Type 1 diabetes mellitus with diabetic peripheral angiopathy without gangrene: Secondary | ICD-10-CM | POA: Diagnosis not present

## 2015-11-25 DIAGNOSIS — I5040 Unspecified combined systolic (congestive) and diastolic (congestive) heart failure: Secondary | ICD-10-CM | POA: Diagnosis not present

## 2015-11-25 DIAGNOSIS — I252 Old myocardial infarction: Secondary | ICD-10-CM | POA: Diagnosis not present

## 2015-11-25 DIAGNOSIS — I110 Hypertensive heart disease with heart failure: Secondary | ICD-10-CM | POA: Diagnosis not present

## 2015-11-25 DIAGNOSIS — M1990 Unspecified osteoarthritis, unspecified site: Secondary | ICD-10-CM | POA: Diagnosis not present

## 2015-11-25 DIAGNOSIS — J22 Unspecified acute lower respiratory infection: Secondary | ICD-10-CM | POA: Diagnosis not present

## 2015-11-25 DIAGNOSIS — L89320 Pressure ulcer of left buttock, unstageable: Secondary | ICD-10-CM | POA: Diagnosis not present

## 2015-11-25 DIAGNOSIS — Z9641 Presence of insulin pump (external) (internal): Secondary | ICD-10-CM | POA: Diagnosis not present

## 2015-11-25 DIAGNOSIS — L89310 Pressure ulcer of right buttock, unstageable: Secondary | ICD-10-CM | POA: Diagnosis not present

## 2015-11-25 DIAGNOSIS — J45909 Unspecified asthma, uncomplicated: Secondary | ICD-10-CM | POA: Diagnosis not present

## 2015-11-28 DIAGNOSIS — Z9641 Presence of insulin pump (external) (internal): Secondary | ICD-10-CM | POA: Diagnosis not present

## 2015-11-28 DIAGNOSIS — J22 Unspecified acute lower respiratory infection: Secondary | ICD-10-CM | POA: Diagnosis not present

## 2015-11-28 DIAGNOSIS — J45909 Unspecified asthma, uncomplicated: Secondary | ICD-10-CM | POA: Diagnosis not present

## 2015-11-28 DIAGNOSIS — I110 Hypertensive heart disease with heart failure: Secondary | ICD-10-CM | POA: Diagnosis not present

## 2015-11-28 DIAGNOSIS — Z96641 Presence of right artificial hip joint: Secondary | ICD-10-CM | POA: Diagnosis not present

## 2015-11-28 DIAGNOSIS — L89310 Pressure ulcer of right buttock, unstageable: Secondary | ICD-10-CM | POA: Diagnosis not present

## 2015-11-28 DIAGNOSIS — M1990 Unspecified osteoarthritis, unspecified site: Secondary | ICD-10-CM | POA: Diagnosis not present

## 2015-11-28 DIAGNOSIS — Z9181 History of falling: Secondary | ICD-10-CM | POA: Diagnosis not present

## 2015-11-28 DIAGNOSIS — E1051 Type 1 diabetes mellitus with diabetic peripheral angiopathy without gangrene: Secondary | ICD-10-CM | POA: Diagnosis not present

## 2015-11-28 DIAGNOSIS — I252 Old myocardial infarction: Secondary | ICD-10-CM | POA: Diagnosis not present

## 2015-11-28 DIAGNOSIS — I5040 Unspecified combined systolic (congestive) and diastolic (congestive) heart failure: Secondary | ICD-10-CM | POA: Diagnosis not present

## 2015-11-28 DIAGNOSIS — L89320 Pressure ulcer of left buttock, unstageable: Secondary | ICD-10-CM | POA: Diagnosis not present

## 2015-12-05 DIAGNOSIS — Z9181 History of falling: Secondary | ICD-10-CM | POA: Diagnosis not present

## 2015-12-05 DIAGNOSIS — I110 Hypertensive heart disease with heart failure: Secondary | ICD-10-CM | POA: Diagnosis not present

## 2015-12-05 DIAGNOSIS — Z9641 Presence of insulin pump (external) (internal): Secondary | ICD-10-CM | POA: Diagnosis not present

## 2015-12-05 DIAGNOSIS — J22 Unspecified acute lower respiratory infection: Secondary | ICD-10-CM | POA: Diagnosis not present

## 2015-12-05 DIAGNOSIS — L89310 Pressure ulcer of right buttock, unstageable: Secondary | ICD-10-CM | POA: Diagnosis not present

## 2015-12-05 DIAGNOSIS — I252 Old myocardial infarction: Secondary | ICD-10-CM | POA: Diagnosis not present

## 2015-12-05 DIAGNOSIS — I5040 Unspecified combined systolic (congestive) and diastolic (congestive) heart failure: Secondary | ICD-10-CM | POA: Diagnosis not present

## 2015-12-05 DIAGNOSIS — M1990 Unspecified osteoarthritis, unspecified site: Secondary | ICD-10-CM | POA: Diagnosis not present

## 2015-12-05 DIAGNOSIS — Z96641 Presence of right artificial hip joint: Secondary | ICD-10-CM | POA: Diagnosis not present

## 2015-12-05 DIAGNOSIS — J45909 Unspecified asthma, uncomplicated: Secondary | ICD-10-CM | POA: Diagnosis not present

## 2015-12-05 DIAGNOSIS — L89320 Pressure ulcer of left buttock, unstageable: Secondary | ICD-10-CM | POA: Diagnosis not present

## 2015-12-05 DIAGNOSIS — E1051 Type 1 diabetes mellitus with diabetic peripheral angiopathy without gangrene: Secondary | ICD-10-CM | POA: Diagnosis not present

## 2015-12-13 DIAGNOSIS — I5040 Unspecified combined systolic (congestive) and diastolic (congestive) heart failure: Secondary | ICD-10-CM | POA: Diagnosis not present

## 2015-12-13 DIAGNOSIS — M1990 Unspecified osteoarthritis, unspecified site: Secondary | ICD-10-CM | POA: Diagnosis not present

## 2015-12-13 DIAGNOSIS — I110 Hypertensive heart disease with heart failure: Secondary | ICD-10-CM | POA: Diagnosis not present

## 2015-12-13 DIAGNOSIS — E1051 Type 1 diabetes mellitus with diabetic peripheral angiopathy without gangrene: Secondary | ICD-10-CM | POA: Diagnosis not present

## 2015-12-13 DIAGNOSIS — J22 Unspecified acute lower respiratory infection: Secondary | ICD-10-CM | POA: Diagnosis not present

## 2015-12-13 DIAGNOSIS — Z9641 Presence of insulin pump (external) (internal): Secondary | ICD-10-CM | POA: Diagnosis not present

## 2015-12-13 DIAGNOSIS — I252 Old myocardial infarction: Secondary | ICD-10-CM | POA: Diagnosis not present

## 2015-12-13 DIAGNOSIS — Z9181 History of falling: Secondary | ICD-10-CM | POA: Diagnosis not present

## 2015-12-13 DIAGNOSIS — L89310 Pressure ulcer of right buttock, unstageable: Secondary | ICD-10-CM | POA: Diagnosis not present

## 2015-12-13 DIAGNOSIS — J45909 Unspecified asthma, uncomplicated: Secondary | ICD-10-CM | POA: Diagnosis not present

## 2015-12-13 DIAGNOSIS — Z96641 Presence of right artificial hip joint: Secondary | ICD-10-CM | POA: Diagnosis not present

## 2015-12-13 DIAGNOSIS — L89320 Pressure ulcer of left buttock, unstageable: Secondary | ICD-10-CM | POA: Diagnosis not present

## 2015-12-29 ENCOUNTER — Other Ambulatory Visit (HOSPITAL_COMMUNITY): Payer: Self-pay | Admitting: *Deleted

## 2015-12-29 DIAGNOSIS — I5022 Chronic systolic (congestive) heart failure: Secondary | ICD-10-CM

## 2015-12-29 MED ORDER — METOPROLOL TARTRATE 100 MG PO TABS
ORAL_TABLET | ORAL | Status: DC
Start: 2015-12-29 — End: 2017-04-10

## 2016-01-18 ENCOUNTER — Other Ambulatory Visit (HOSPITAL_COMMUNITY): Payer: Self-pay | Admitting: Internal Medicine

## 2016-01-19 ENCOUNTER — Telehealth: Payer: Self-pay | Admitting: *Deleted

## 2016-01-19 DIAGNOSIS — L578 Other skin changes due to chronic exposure to nonionizing radiation: Secondary | ICD-10-CM | POA: Diagnosis not present

## 2016-01-19 DIAGNOSIS — L570 Actinic keratosis: Secondary | ICD-10-CM | POA: Diagnosis not present

## 2016-01-19 DIAGNOSIS — C4442 Squamous cell carcinoma of skin of scalp and neck: Secondary | ICD-10-CM | POA: Diagnosis not present

## 2016-01-19 DIAGNOSIS — C4441 Basal cell carcinoma of skin of scalp and neck: Secondary | ICD-10-CM | POA: Diagnosis not present

## 2016-01-19 MED ORDER — GLUCOSE BLOOD VI STRP: 1.0000 | ORAL_STRIP | Freq: Two times a day (BID) | Status: DC

## 2016-01-19 NOTE — Telephone Encounter (Signed)
Pt left msg on triage yesterday stating she is running low on her supples w/[facility] needing office to call them to order. [place] spoke w/rep she stated pt is not due until June 20th. A shipment was sent to her 11/12/15. Called pt this am hgave her info. Pt states she is needing some more strips. Verified what name of strips can send [facility] day [facility]. Sent one touch electronically...Johny Chess

## 2016-01-30 ENCOUNTER — Other Ambulatory Visit: Payer: Self-pay | Admitting: Internal Medicine

## 2016-01-31 DIAGNOSIS — D044 Carcinoma in situ of skin of scalp and neck: Secondary | ICD-10-CM | POA: Diagnosis not present

## 2016-02-06 ENCOUNTER — Telehealth: Payer: Self-pay | Admitting: Internal Medicine

## 2016-02-06 NOTE — Telephone Encounter (Signed)
Patient states she test her sugar 4 times a day.  [name] requesting one touch ultra test strips to be sent to [facility].

## 2016-02-07 NOTE — Telephone Encounter (Signed)
Called to inform pt I have not received faxed but will call to get order form

## 2016-02-10 DIAGNOSIS — E109 Type 1 diabetes mellitus without complications: Secondary | ICD-10-CM | POA: Diagnosis not present

## 2016-02-10 NOTE — Telephone Encounter (Signed)
faxed

## 2016-02-22 ENCOUNTER — Encounter: Payer: Self-pay | Admitting: Internal Medicine

## 2016-02-22 ENCOUNTER — Ambulatory Visit (INDEPENDENT_AMBULATORY_CARE_PROVIDER_SITE_OTHER): Payer: Medicare Other | Admitting: Internal Medicine

## 2016-02-22 ENCOUNTER — Other Ambulatory Visit (HOSPITAL_COMMUNITY): Payer: Self-pay | Admitting: Internal Medicine

## 2016-02-22 VITALS — BP 120/60 | HR 62 | Temp 97.6°F | Resp 16 | Wt 152.8 lb

## 2016-02-22 DIAGNOSIS — I10 Essential (primary) hypertension: Secondary | ICD-10-CM

## 2016-02-22 DIAGNOSIS — E1142 Type 2 diabetes mellitus with diabetic polyneuropathy: Secondary | ICD-10-CM | POA: Diagnosis not present

## 2016-02-22 DIAGNOSIS — N183 Chronic kidney disease, stage 3 unspecified: Secondary | ICD-10-CM

## 2016-02-22 DIAGNOSIS — N189 Chronic kidney disease, unspecified: Secondary | ICD-10-CM

## 2016-02-22 DIAGNOSIS — I5032 Chronic diastolic (congestive) heart failure: Secondary | ICD-10-CM

## 2016-02-22 DIAGNOSIS — E1051 Type 1 diabetes mellitus with diabetic peripheral angiopathy without gangrene: Secondary | ICD-10-CM | POA: Diagnosis not present

## 2016-02-22 LAB — POCT GLYCOSYLATED HEMOGLOBIN (HGB A1C): HEMOGLOBIN A1C: 6.7

## 2016-02-22 MED ORDER — GLUCOSE BLOOD VI STRP: 1.0000 | ORAL_STRIP | Freq: Four times a day (QID) | Status: DC

## 2016-02-22 NOTE — Progress Notes (Signed)
Subjective:  Patient ID: Emma Yu, female    DOB: Jun 09, 1927  Age: 82 y.o. MRN: [id number]  CC: Hypertension; Coronary Artery Disease; Congestive Heart Failure; and Diabetes   HPI JAYSON MCNEASE presents for follow-up. Over the last few weeks she complains of a few blood sugar spikes one up to 270 and one up to 323. She doesn't know why her blood sugar spiked and she didn't have to change her insulin dosing to bring the blood sugar down. She's gained 7 pounds since I last saw her. She denies polyuria, polydipsia, or polyphagia. She has had no changes in her vision. She denies chest pain, shortness of breath, edema, palpitations, or worsening fatigue.  Outpatient Prescriptions Prior to Visit  Medication Sig Dispense Refill  . Ascorbic Acid (VITAMIN C) 500 MG tablet Take 500 mg by mouth daily.      Marland Kitchen aspirin EC 81 MG tablet Take 81 mg by mouth 2 (two) times daily.    Marland Kitchen b complex vitamins tablet Take 1 tablet by mouth daily.      . Cholecalciferol (VITAMIN D3) 2000 UNITS capsule Take 2,000 Units by mouth daily.      . fish oil-omega-3 fatty acids 1000 MG capsule Take 2 g by mouth 2 (two) times daily.     . furosemide (LASIX) 20 MG tablet TAKE 3 TABLETS (60 MG TOTAL) BY MOUTH DAILY. 90 tablet 6  . gabapentin (NEURONTIN) 300 MG capsule Take 2 capsules (600 mg total) by mouth at bedtime. 60 capsule 11  . Insulin Infusion Pump Supplies (INSET INFUSION SET 23" 6MM) MISC 1 Device by Does not apply route every 3 (three) days.    . Insulin Infusion Pump Supplies (INSULIN PUMP SYRINGE RESERVOIR) MISC 1 Device by Does not apply route every 3 (three) days. Animas 2 ml    . insulin lispro (HUMALOG) 100 UNIT/ML injection Inject via insulin pump for a total of 70 units per day 70 mL 3  . Ipratropium-Albuterol (COMBIVENT RESPIMAT) 20-100 MCG/ACT AERS respimat Inhale 1 puff into the lungs every 6 (six) hours as needed for wheezing. 4 g 11  . metoprolol (LOPRESSOR) 100 MG tablet TAKE 1 TABLET (100  MG TOTAL) BY MOUTH 2 (TWO) TIMES DAILY. 60 tablet 11  . omeprazole (PRILOSEC) 40 MG capsule Take 1 capsule by mouth  daily before breakfast 90 capsule 2  . pravastatin (PRAVACHOL) 20 MG tablet TAKE 1 TABLET BY MOUTH IN THE EVENING 90 tablet 1  . Probiotic Product (PROBIOTIC PO) Take 1 tablet by mouth daily at 10 pm.    . traMADol (ULTRAM) 50 MG tablet TAKE 2 TABLETS BY MOUTH EVERY 6 HOURS AS NEEDED FOR PAIN 120 tablet 3  . valsartan (DIOVAN) 320 MG tablet TAKE 1 TABLET (320 MG TOTAL) BY MOUTH AT BEDTIME. 90 tablet 0  . glucose blood (ONE TOUCH ULTRA TEST) test strip 1 each by Other route 2 (two) times daily. Use to check blood sugars twice a day Dx E11.9 50 each 0  . metoprolol (LOPRESSOR) 100 MG tablet TAKE 1 TABLET (100 MG TOTAL) BY MOUTH 2 (TWO) TIMES DAILY. 60 tablet 11  . pravastatin (PRAVACHOL) 20 MG tablet Take 20 mg by mouth daily.     No facility-administered medications prior to visit.    ROS Review of Systems  Constitutional: Positive for fatigue. Negative for fever, chills, diaphoresis, appetite change and unexpected weight change.  HENT: Negative.  Negative for sinus pressure and trouble swallowing.   Eyes: Negative.   Respiratory:  Negative.  Negative for cough, choking, chest tightness, shortness of breath and stridor.   Cardiovascular: Negative.  Negative for chest pain, palpitations and leg swelling.  Gastrointestinal: Negative.  Negative for nausea, vomiting, abdominal pain, diarrhea and constipation.  Endocrine: Negative.  Negative for polydipsia, polyphagia and polyuria.  Genitourinary: Negative.  Negative for dysuria, hematuria, decreased urine volume and difficulty urinating.  Musculoskeletal: Negative.  Negative for myalgias, back pain, joint swelling, arthralgias and neck pain.       Stinging pain in both feet well-controlled with tramadol and Neurontin  Skin: Negative.  Negative for color change and rash.  Allergic/Immunologic: Negative.   Neurological: Negative.   Negative for dizziness.  Hematological: Negative.  Negative for adenopathy. Does not bruise/bleed easily.  Psychiatric/Behavioral: Negative.     Objective:  BP 120/60 mmHg  Pulse 62  Temp(Src) 97.6 F (36.4 C) (Oral)  Resp 16  Wt 152 lb 12 oz (69.287 kg)  SpO2 95%  BP Readings from Last 3 Encounters:  02/22/16 120/60  10/25/15 120/68  10/06/15 120/60    Wt Readings from Last 3 Encounters:  02/22/16 152 lb 12 oz (69.287 kg)  10/25/15 145 lb (65.772 kg)  10/06/15 145 lb (65.772 kg)    Physical Exam  Constitutional: [name] oriented to person, place, and time. No distress.  HENT:  Mouth/Throat: Oropharynx is clear and moist. No oropharyngeal exudate.  Eyes: Conjunctivae are normal. Right eye exhibits no discharge. Left eye exhibits no discharge. No scleral icterus.  Neck: Normal range of motion. Neck supple. No JVD present. No tracheal deviation present. No thyromegaly present.  Cardiovascular: Normal rate, regular rhythm and intact distal pulses.  Exam reveals no gallop and no friction rub.   Murmur heard. Pulmonary/Chest: Effort normal and breath sounds normal. No stridor. No respiratory distress. She has no wheezes. She has no rales. She exhibits no tenderness.  Abdominal: Soft. Bowel sounds are normal. She exhibits no distension and no mass. There is no tenderness. There is no rebound and no guarding.  Musculoskeletal: Normal range of motion. She exhibits no edema or tenderness.  Lymphadenopathy:    She has no cervical adenopathy.  Neurological: [name] oriented to person, place, and time.  Skin: Skin is warm and dry. No rash noted. [name] not diaphoretic. No erythema. No pallor.  Vitals reviewed.   Lab Results  Component Value Date   WBC 6.3 08/31/2015   HGB 13.3 08/31/2015   HCT 40.8 08/31/2015   PLT 218.0 08/31/2015   GLUCOSE 202* 08/31/2015   CHOL 117 08/31/2015   TRIG 133.0 08/31/2015   HDL 48.80 08/31/2015   LDLCALC 42 08/31/2015   ALT 23 06/27/2014   AST  28 06/27/2014   NA 140 08/31/2015   K 5.1 08/31/2015   CL 101 08/31/2015   CREATININE 1.56* 08/31/2015   BUN 57* 08/31/2015   CO2 33* 08/31/2015   TSH 1.90 08/31/2015   INR 1.18 12/25/2011   HGBA1C 6.7 02/22/2016   MICROALBUR 2.0* 08/31/2015    Dg Chest 2 View  10/06/2015  CLINICAL DATA:  Cough and shortness of breath for 10 days. EXAM: CHEST  2 VIEW COMPARISON:  August 24, 2015. FINDINGS: Stable cardiomediastinal silhouette. No pneumothorax or pleural effusion is noted. No acute pulmonary disease is noted. Minimal anterior osteophyte formation is noted in lower thoracic spine. Atherosclerosis of thoracic aorta is noted. IMPRESSION: No active cardiopulmonary disease. Electronically Signed   By: Marijo Conception, M.D.   On: 10/06/2015 16:15    Assessment &  Plan:   Etheline was seen today for hypertension, coronary artery disease, congestive heart failure and diabetes.  Diagnoses and all orders for this visit:  Diabetes mellitus type 1 with peripheral artery disease ([place])- her A1c is down to 6.7%, overall her blood sugars are adequately well-controlled. She was encouraged to follow a sliding scale for any blood sugar increases over 250. -     glucose blood (ONE TOUCH ULTRA TEST) test strip; 1 each by Other route 4 (four) times daily. Dx E11.9 -     POCT glycosylated hemoglobin (Hb A1C)  Essential hypertension, benign- her blood pressure is adequately well-controlled.  Chronic renal insufficiency, stage III (moderate)- her renal function has been stable, she will continue to avoid nephrotoxic agents. I will continue to work for good control of her blood pressure and her blood sugars.  Diabetic peripheral neuropathy ([place])- her symptoms are adequately well-controlled with the current regimen.  Chronic diastolic heart failure ([place])- she has no concerning symptoms and her fluid status is normal today. Will continue furosemide at the current dose and will continue her other antihypertensives  as well.   I have changed Ms. Shinault's glucose blood. I am also having her maintain her vitamin C, Vitamin D3, b complex vitamins, fish oil-omega-3 fatty acids, aspirin EC, Probiotic Product (PROBIOTIC PO), Insulin Pump Syringe Reservoir, INSET INFUSION SET 23" 6MM, gabapentin, furosemide, Ipratropium-Albuterol, insulin lispro, traMADol, pravastatin, metoprolol, valsartan, and omeprazole.  Meds ordered this encounter  Medications  . glucose blood (ONE TOUCH ULTRA TEST) test strip    Sig: 1 each by Other route 4 (four) times daily. Dx E11.9    Dispense:  360 each    Refill:  3     Follow-up: Return in about 6 months (around 08/24/2016).  [name], MD

## 2016-02-22 NOTE — Patient Instructions (Signed)
Type 1 Diabetes Mellitus, Adult Type 1 diabetes mellitus, often simply referred to as diabetes, is a long-term (chronic) disease. It occurs when the islet cells in the pancreas that make insulin (a hormone) are destroyed and can no longer make insulin. Insulin is needed to move sugars from food into the tissue cells. The tissue cells use the sugars for energy. In people with type 1 diabetes, the sugars build up in the blood instead of going into the tissue cells. As a result, high blood sugar (hyperglycemia) develops. Without insulin, the body breaks down fat cells for the needed energy. This breakdown of fat cells produces acid chemicals (ketones), which increases the acid levels in the body. The effect of either high ketone or high sugar (glucose) levels can be life-threatening.  Type 1 diabetes was also previously called juvenile diabetes. It most often occurs before the age of 30, but it can occur at any age. RISK FACTORS A person is predisposed to developing type 1 diabetes if someone in his or her family has the disease and is exposed to certain additional environmental triggers.  SYMPTOMS  Symptoms of type 1 diabetes may develop gradually over days to weeks or suddenly. The symptoms occur due to hyperglycemia. The symptoms can include:   Increased thirst (polydipsia).  Increased urination (polyuria).  Increased urination during the night (nocturia).  Weight loss. This weight loss may be rapid.  Frequent, recurring infections.  Tiredness (fatigue).  Weakness.  Vision changes, such as blurred vision.  Fruity smell to your breath.  Abdominal pain.  Nausea or vomiting.  An open skin wound (ulcer). DIAGNOSIS  Type 1 diabetes is diagnosed when symptoms of diabetes are present and when blood glucose levels are increased. Your blood glucose level may be checked by one or more of the following blood tests:  A fasting blood glucose test. You will not be allowed to eat for at least 8  hours before a blood sample is taken.  A random blood glucose test. Your blood glucose is checked at any time of the day regardless of when you ate.  A hemoglobin A1c blood glucose test. A hemoglobin A1c test provides information about blood glucose control over the previous 3 months. TREATMENT  Although type 1 diabetes cannot be prevented, it can be managed with insulin, diet, and exercise.  You will need to take insulin daily to keep blood glucose in the desired range.  You will need to match insulin dosing with exercise and healthy food choices. Generally, the goal of treatment is to maintain a pre-meal (preprandial) blood glucose level of 80-130 mg/dL. HOME CARE INSTRUCTIONS   Have your hemoglobin A1c level checked twice a year.  Perform daily blood glucose monitoring as directed by your health care provider.  Monitor urine ketones when you are ill and as directed by your health care provider.  Take your insulin as directed by your health care provider to maintain your blood glucose level in the desired range.  Never run out of insulin. It is needed every day.  Adjust insulin based on your intake of carbohydrates. Carbohydrates can raise blood glucose levels but need to be included in your diet. Carbohydrates provide vitamins, minerals, and fiber, which are an essential part of a healthy diet. Carbohydrates are found in fruits, vegetables, whole grains, dairy products, legumes, and foods containing added sugars.  Eat healthy foods. Alternate 3 meals with 3 snacks.  Maintain a healthy weight.  Carry a medical alert card or wear your medical alert   jewelry.  Carry a 15-gram carbohydrate snack with you at all times to treat low blood glucose (hypoglycemia). Some examples of 15-gram carbohydrate snacks include:  Glucose tablets, 3 or 4.  Glucose gel, 15-gram tube.  Raisins, 2 tablespoons (24 grams).  Jelly beans, 6.  Animal crackers, 8.  Fruit juice, regular soda, or  low-fat milk, 4 ounces (120 mL).  Gummy treats, 9.  Recognize hypoglycemia. Hypoglycemia occurs with blood glucose levels of 70 mg/dL and below. The risk for hypoglycemia increases when fasting or skipping meals, during or after intense exercise, and during sleep. Hypoglycemia symptoms can include:  Tremors or shakes.  Decreased ability to concentrate.  Sweating.  Increased heart rate.  Headache.  Dry mouth.  Hunger.  Irritability.  Anxiety.  Restless sleep.  Altered speech or coordination.  Confusion.  Treat hypoglycemia promptly. If you are alert and able to safely swallow, follow the 15:15 rule:  Take 15-20 grams of rapid-acting glucose or carbohydrate. Rapid-acting options include glucose gel, glucose tablets, or 4 ounces (120 mL) of fruit juice, regular soda, or low-fat milk.  Check your blood glucose level 15 minutes after taking the glucose.  Take 15-20 grams more of glucose if the repeat blood glucose level is still 70 mg/dL or below.  Eat a meal or snack within 1 hour once blood glucose levels return to normal.  Be alert to polyuria and polydipsia, which are early signs of hyperglycemia. An early awareness of hyperglycemia allows for prompt treatment. Treat hyperglycemia as directed by your health care provider.  Exercise regularly as directed by your health care provider. This includes:  Stretching and performing strength training exercises, such as yoga or weight lifting, at least 2 times per week.  Performing a total of at least 150 minutes of moderate-intensity exercise each week, such as brisk walking or water aerobics.  Exercising at least 3 days per week, making sure you allow no more than 2 consecutive days to pass without exercising.  Avoiding long periods of inactivity (90 minutes or more). When you have to spend an extended period of time sitting down, take frequent breaks to walk or stretch.  Adjust your insulin dosing and food intake as needed  if you start a new exercise or sport.  Follow your sick-day plan at any time you are unable to eat or drink as usual.   Do not use any tobacco products including cigarettes, chewing tobacco, or electronic cigarettes. If you need help quitting, ask your health care provider.  Limit alcohol intake to no more than 1 drink per day for nonpregnant women and 2 drinks per day for men. You should drink alcohol only when you are also eating food. Talk with your health care provider about whether alcohol is safe for you. Tell your health care provider if you drink alcohol several times a week.  Keep all follow-up visits as directed by your health care provider.  Schedule an eye exam within 5 years of diagnosis and then annually.  Perform daily skin and foot care. Examine your skin and feet daily for cuts, bruises, redness, nail problems, bleeding, blisters, or sores. A foot exam should be done by a health care provider 5 years after diagnosis, and then every year after the first exam.  Brush your teeth and gums at least twice a day and floss at least once a day. Follow up with your dentist regularly.  Share your diabetes management plan with your workplace or school.  Keep your immunizations up to date. It   is recommended that you receive a flu (influenza) vaccine every year. It is also recommended that you receive a pneumonia (pneumococcal) vaccine. If you are 65 years of age or older and have never received a pneumonia vaccine, this vaccine may be given as a series of two separate shots. Ask your health care provider which additional vaccines may be recommended.  Learn to manage stress.  Obtain ongoing diabetes education and support as needed.  Participate in or seek rehabilitation as needed to maintain or improve independence and quality of life. Request a physical or occupational therapy referral if you are having foot or hand numbness, or difficulties with grooming, dressing, eating, or physical  activity. SEEK MEDICAL CARE IF:   You are unable to eat food or drink fluids for more than 6 hours.  You have nausea and vomiting for more than 6 hours.  Your blood glucose level is over 240 mg/dL.  There is a change in mental status.  You develop an additional serious illness.  You have diarrhea for more than 6 hours.  You have been sick or have had a fever for a couple of days and are not getting better.  You have pain during any physical activity. SEEK IMMEDIATE MEDICAL CARE IF:  You have difficulty breathing.  You have moderate to large ketone levels. MAKE SURE YOU:  Understand these instructions.  Will watch your condition.  Will get help right away if you are not doing well or get worse.   This information is not intended to replace advice given to you by your health care provider. Make sure you discuss any questions you have with your health care provider.   Document Released: 08/03/2000 Document Revised: 04/27/2015 Document Reviewed: 03/04/2012 Elsevier Interactive Patient Education 2016 [employer]  

## 2016-02-22 NOTE — Progress Notes (Signed)
Pre visit review using our clinic review tool, if applicable. No additional management support is needed unless otherwise documented below in the visit note. 

## 2016-02-28 ENCOUNTER — Ambulatory Visit: Payer: Medicare Other | Admitting: Internal Medicine

## 2016-03-06 ENCOUNTER — Encounter: Payer: Self-pay | Admitting: Cardiovascular Disease

## 2016-03-06 ENCOUNTER — Ambulatory Visit (INDEPENDENT_AMBULATORY_CARE_PROVIDER_SITE_OTHER): Payer: Medicare Other | Admitting: Cardiovascular Disease

## 2016-03-06 VITALS — BP 140/60 | HR 52 | Ht 64.0 in | Wt 152.0 lb

## 2016-03-06 DIAGNOSIS — I482 Chronic atrial fibrillation, unspecified: Secondary | ICD-10-CM

## 2016-03-06 DIAGNOSIS — I739 Peripheral vascular disease, unspecified: Secondary | ICD-10-CM

## 2016-03-06 DIAGNOSIS — I10 Essential (primary) hypertension: Secondary | ICD-10-CM | POA: Diagnosis not present

## 2016-03-06 NOTE — Progress Notes (Signed)
Cardiology Office Note   Date:  03/06/2016   ID:  Emma Yu, DOB 08/20/1927, MRN [id number]  PCP:  [name], MD  Cardiologist:   Dr. Johnsie Cancel  Chief Complaint  Patient presents with  . Follow-up  . PAD      History of Present Illness: Emma Yu is a 82 y.o. female who presents for a follow up visit regarding peripheral arterial disease . She has a history of chronic Atrial fibrillation not on anticoagulation due to recurrent falls, chronic diastolic heart failure, peripheral arterial disease, status post bilateral common iliac artery stenting in 2002  with known significant right SFA disease, diabetes, hypertension, hyperlipidemia. LHC 01/10/11: Luminal irregularities.  She walks very slowly with a walker.  She has known history of chronic venous insufficiency with significant stasis dermatitis. Most recent echo in 08/2015 showed normal EF, mild AI and calcified aortic valve without significant stenosis.  He son is with her today. She reports gradual worsening of dyspnea and overall functioning. She walks slowly with a walker. NO chest or claudication.     Past Medical History  Diagnosis Date  . Hypertension   . Hyperlipidemia   . Chronic diastolic heart failure (HCC)     Echocardiogram 12/10/11: Moderate LVH, EF 65-70%, moderate aortic stenosis, mean gradient 20, mild MS  . Atrial fibrillation ([place])     not a coumadin candidate secondary to fall risk  . Aortic stenosis     Moderate by echo 4/13 - mean 20 mmHg  . Mitral stenosis     Mild by echo 4/13  . Acute respiratory failure ([place])     12/2011 admission - decreased O2 sats on ambulation, improved by time of discharge  . CAD (coronary artery disease)     minimal plaque by cath 5/12  . CHF (congestive heart failure) (HCC)     EF 55-60%  . PAD (peripheral artery disease) (HCC)     s/p bilateral comon iliac artery stenting in 2002. Known significant  R SFA  disease. carotid dz noted 11/2011 followed by  Dr. Bridgett Larsson  . Venous insufficiency   . Chronic atrial fibrillation with RVR (HCC)   . Myocardial infarction ([place]) 12/2010  . DVT (deep venous thrombosis) ([place]) ~ 2012    LLE  . Type II diabetes mellitus ([place]) dx'd 1999  . Iron deficiency anemia   . History of blood transfusion 1957    "related to childbirth"  . Migraine     "none since my hysterectomy" (08/24/2015)  . DJD (degenerative joint disease) of hip     s/p R THR 10/2010  . Arthritis     "back" (08/24/2015)  . Chronic mid back pain   . Skin cancer     right forehead/head  . Kidney stones ~ 1958    "no OR"  . Insulin pump in place     Past Surgical History  Procedure Laterality Date  . Total hip arthroplasty Right 1991  . Lumbar disc surgery  1999  . Cardiac catheterization  01/10/2011    No significant CAD  . Abdominal hysterectomy    . Cataract extraction, bilateral Bilateral 2015  . Tonsillectomy    . Back surgery    . Cholecystectomy open    . Appendectomy    . Joint replacement    . Revision total hip arthroplasty  1994  . Iliac artery stent Bilateral 2002    [place]  . Skin cancer excision  2016    top of  right forehead/head     Current Outpatient Prescriptions  Medication Sig Dispense Refill  . Ascorbic Acid (VITAMIN C) 500 MG tablet Take 500 mg by mouth daily.      Marland Kitchen aspirin EC 81 MG tablet Take 81 mg by mouth 2 (two) times daily.    Marland Kitchen b complex vitamins tablet Take 1 tablet by mouth daily.      . Cholecalciferol (VITAMIN D3) 2000 UNITS capsule Take 2,000 Units by mouth daily.      . fish oil-omega-3 fatty acids 1000 MG capsule Take 2 g by mouth 2 (two) times daily.     . furosemide (LASIX) 20 MG tablet TAKE 3 TABLETS (60 MG TOTAL) BY MOUTH DAILY. 90 tablet 6  . gabapentin (NEURONTIN) 300 MG capsule Take 2 capsules (600 mg total) by mouth at bedtime. 60 capsule 11  . glucose blood (ONE TOUCH ULTRA TEST) test strip 1 each by Other route 4 (four) times daily. Dx E11.9 360 each 3  . Insulin Infusion Pump  Supplies (INSET INFUSION SET 23" 6MM) MISC 1 Device by Does not apply route every 3 (three) days.    . Insulin Infusion Pump Supplies (INSULIN PUMP SYRINGE RESERVOIR) MISC 1 Device by Does not apply route every 3 (three) days. Animas 2 ml    . insulin lispro (HUMALOG) 100 UNIT/ML injection Inject via insulin pump for a total of 70 units per day 70 mL 3  . metoprolol (LOPRESSOR) 100 MG tablet TAKE 1 TABLET (100 MG TOTAL) BY MOUTH 2 (TWO) TIMES DAILY. 60 tablet 11  . omeprazole (PRILOSEC) 40 MG capsule Take 1 capsule by mouth  daily before breakfast 90 capsule 2  . pravastatin (PRAVACHOL) 20 MG tablet TAKE 1 TABLET BY MOUTH IN THE EVENING 90 tablet 1  . Probiotic Product (PROBIOTIC PO) Take 1 tablet by mouth daily at 10 pm.    . traMADol (ULTRAM) 50 MG tablet TAKE 2 TABLETS BY MOUTH EVERY 6 HOURS AS NEEDED FOR PAIN 120 tablet 3  . valsartan (DIOVAN) 320 MG tablet TAKE 1 TABLET (320 MG TOTAL) BY MOUTH AT BEDTIME. 90 tablet 0   No current facility-administered medications for this visit.    Allergies:   Carvedilol; Augmentin; Clindamycin/lincomycin; Diltiazem hcl; Fenofibrate; Hctz; Nisoldipine; Nitroglycerin; Propranolol; Septra; Tekturna; and [place]    Social History:  The patient  reports that she quit smoking about 30 years ago. Her smoking use included Cigarettes. She has a 74 pack-year smoking history. She has never used smokeless tobacco. She reports that she does not drink alcohol or use illicit drugs.   Family History:  The patient's family history includes Diabetes in her father and other.    ROS:  Please see the history of present illness.   Otherwise, review of systems are positive for none.   All other systems are reviewed and negative.    PHYSICAL EXAM: VS:  BP 140/60 mmHg  Pulse 52  Ht 5\' 4"  (1.626 m)  Wt 152 lb (68.947 kg)  BMI 26.08 kg/m2 , BMI Body mass index is 26.08 kg/(m^2). GEN: Well nourished, well developed, in no acute distress HEENT: normal Neck: no JVD,  carotid bruits, or masses Cardiac: irregularly irregular; no  rubs, or gallops,no edema . 2/6 SEM in aortic area.  Respiratory:  clear to auscultation bilaterally, normal work of breathing GI: soft, nontender, nondistended, + BS MS: no deformity or atrophy Skin: warm and dry,  Chronic stasis dermatitis.  Neuro:  Strength and sensation are intact Psych: euthymic mood, full  affect   EKG:  EKG is not ordered today.    Recent Labs: 08/31/2015: BUN 57*; Creatinine, Ser 1.56*; Hemoglobin 13.3; Platelets 218.0; Potassium 5.1; Sodium 140; TSH 1.90    Lipid Panel    Component Value Date/Time   CHOL 117 08/31/2015 1613   TRIG 133.0 08/31/2015 1613   HDL 48.80 08/31/2015 1613   CHOLHDL 2 08/31/2015 1613   VLDL 26.6 08/31/2015 1613   LDLCALC 42 08/31/2015 1613      Wt Readings from Last 3 Encounters:  03/06/16 152 lb (68.947 kg)  02/22/16 152 lb 12 oz (69.287 kg)  10/25/15 145 lb (65.772 kg)         ASSESSMENT AND PLAN:  1.   Chronic diastolic heart failure: She currently takes furosemide 60 mg once daily. She appears to be euvolemic. I suspect that her worsening dyspnea is likely multifactorial. Overall functional capacity has worsened over the last year.  2. Chronic atrial fibrillation: Ventricular rate is controlled on metoprolol. She has been deemed to be not a candidate for anticoagulation.  3. Peripheral arterial disease: No claudication is reported by the patient.  4. Essential hypertension: Hypotension improved after stopping amlodipine.  5. Diabetes mellitus: She reports running out of her insulin strips. I asked her to follow-up with her primary care physician about that.  6. Moderate bilateral carotid stenosis: Given her age and comorbidities, I don't think she would be a good candidate for endarterectomy if her disease is asymptomatic.   Disposition:   FU with me in 6 months  Signed,  Kathlyn Sacramento, MD  03/06/2016 10:54 AM    [place]

## 2016-03-06 NOTE — Patient Instructions (Signed)

## 2016-03-10 ENCOUNTER — Other Ambulatory Visit: Payer: Self-pay | Admitting: Cardiovascular Disease

## 2016-03-13 ENCOUNTER — Telehealth: Payer: Self-pay | Admitting: Emergency Medicine

## 2016-03-13 DIAGNOSIS — E1051 Type 1 diabetes mellitus with diabetic peripheral angiopathy without gangrene: Secondary | ICD-10-CM

## 2016-03-13 MED ORDER — GLUCOSE BLOOD VI STRP: ORAL_STRIP | 3 refills | Status: DC

## 2016-03-13 NOTE — Telephone Encounter (Signed)
Patients friend called and stated pt needs a refill on glucose blood (ONE TOUCH ULTRA TEST) test strip  Patient is not getting her test strips. She been going to the drug store to buy them since shes been out. Please follow up thanks.

## 2016-03-13 NOTE — Telephone Encounter (Signed)
rx needs to be faxed to [facility].   Printed rx for PCP to sign.

## 2016-03-15 ENCOUNTER — Telehealth: Payer: Self-pay | Admitting: Cardiovascular Disease

## 2016-03-15 NOTE — Telephone Encounter (Signed)
Looked at patient's last AVS with Dr. Fletcher [name]. There is no order or suggestion of an order for a AAA in treatment plan at this time. Left message for patient to call back.   Patient's last AAA duplex was 07/19/15, and test results showed- stable in ABI and iliac disease.

## 2016-03-15 NOTE — Telephone Encounter (Signed)
New Message  Pt calling to speak w/ RN- stated that on her AVS- it mentions an Abdom Aortic Aneurysm study- Pt was made aware that this is a test and that there is no order in the system about a AAA- pt wanted to discuss w/ N. Please call back and discuss.

## 2016-03-16 DIAGNOSIS — E109 Type 1 diabetes mellitus without complications: Secondary | ICD-10-CM | POA: Diagnosis not present

## 2016-03-16 NOTE — Telephone Encounter (Signed)
I spoke with the pt and made her aware that the AAA duplex order was an old order just "sitting" in the computer system and she does not need this test.   I have deleted this order.

## 2016-03-20 DIAGNOSIS — H02834 Dermatochalasis of left upper eyelid: Secondary | ICD-10-CM | POA: Diagnosis not present

## 2016-03-20 DIAGNOSIS — H52222 Regular astigmatism, left eye: Secondary | ICD-10-CM | POA: Diagnosis not present

## 2016-03-20 DIAGNOSIS — E119 Type 2 diabetes mellitus without complications: Secondary | ICD-10-CM | POA: Diagnosis not present

## 2016-04-04 ENCOUNTER — Other Ambulatory Visit: Payer: Self-pay | Admitting: Internal Medicine

## 2016-04-10 ENCOUNTER — Telehealth: Payer: Self-pay | Admitting: Cardiovascular Disease

## 2016-04-10 NOTE — Telephone Encounter (Signed)
Pt c/o Shortness Of Breath: STAT if SOB developed within the last 24 hours or pt is noticeably SOB on the phone  1. Are you currently SOB (can you hear that pt is SOB on the phone)? yes  2. How long have you been experiencing SOB? weeks 3. Are you SOB when sitting or when up moving around? up moving around 4. Are you currently experiencing any other symptoms? Tightness in chest

## 2016-04-10 NOTE — Telephone Encounter (Signed)
Received call from patient-pt requesting to be seen for SOB x 2 -3 weeks.  Pt reports increased SOB with exertion, able to speak in complete sentences on phone. Reports weight increased 1.5 lbs this AM-reports automatically goes to nurse every morning. Denies SOB being worse today than 2-3 weeks ago.  Denies CP, swelling in LE, reports feeling "swollen" in abdomen and chest area x 2-3 weeks as well.  This AM BP 148/65 HR-71.  Pt doesn't feel like she needs to go to the ER.    Encouraged if s/sx become worse she needs to go to ER for evaluation.  Advised I would see if we could get her seen this week by APP and will return call to let her know.  Pt verbalized understanding.  Appt made for Thursday 8/24 @ 11AM with Cecilie Kicks NP at [facility] location. Again advised if SOB increases or s/sx increase, go to ER for eval.  Pt aware and verbalized understand.

## 2016-04-11 NOTE — Progress Notes (Signed)
Cardiology Office Note   Date:  04/12/2016   ID:  Emma, Yu 1927/05/02, MRN [id number]  PCP:  [name], MD  Cardiologist:  Dr. Fletcher [name]    Chief Complaint  Patient presents with  . Shortness of Breath      History of Present Illness: Emma Yu is a 82 y.o. female who presents for SOB 2-3 weeks.   She has a history of chronic Atrial fibrillation not on anticoagulation due to recurrent falls, chronic diastolic heart failure, peripheral arterial disease, status post bilateral common iliac artery stenting in 2002  with known significant right SFA disease, diabetes, hypertension, hyperlipidemia. LHC 01/10/11: Luminal irregularities.   She walks very slowly with a walker.  She has known history of chronic venous insufficiency with significant stasis dermatitis. Most recent echo in 08/2015 showed normal EF, mild AI and calcified aortic valve without significant stenosis.  Type I DM  Today she complains of SOB for several days and chest pressure.  She had chest pressure before when she had CHF.  Overall her wt is down from previous visits.  She has had more salt recently.      Past Medical History:  Diagnosis Date  . Acute respiratory failure ([place])    12/2011 admission - decreased O2 sats on ambulation, improved by time of discharge  . Aortic stenosis    Moderate by echo 4/13 - mean 20 mmHg  . Arthritis    "back" (08/24/2015)  . Atrial fibrillation ([place])    not a coumadin candidate secondary to fall risk  . CAD (coronary artery disease)    minimal plaque by cath 5/12  . CHF (congestive heart failure) (HCC)    EF 55-60%  . Chronic atrial fibrillation with RVR (HCC)   . Chronic diastolic heart failure (HCC)    Echocardiogram 12/10/11: Moderate LVH, EF 65-70%, moderate aortic stenosis, mean gradient 20, mild MS  . Chronic mid back pain   . DJD (degenerative joint disease) of hip    s/p R THR 10/2010  . DVT (deep venous thrombosis) ([place]) ~ 2012   LLE  .  History of blood transfusion 1957   "related to childbirth"  . Hyperlipidemia   . Hypertension   . Insulin pump in place   . Iron deficiency anemia   . Kidney stones ~ 1958   "no OR"  . Migraine    "none since my hysterectomy" (08/24/2015)  . Mitral stenosis    Mild by echo 4/13  . Myocardial infarction ([place]) 12/2010  . PAD (peripheral artery disease) (HCC)    s/p bilateral comon iliac artery stenting in 2002. Known significant  R SFA  disease. carotid dz noted 11/2011 followed by Dr. Bridgett Larsson  . Skin cancer    right forehead/head  . Type II diabetes mellitus ([place]) dx'd 1999  . Venous insufficiency     Past Surgical History:  Procedure Laterality Date  . ABDOMINAL HYSTERECTOMY    . APPENDECTOMY    . BACK SURGERY    . CARDIAC CATHETERIZATION  01/10/2011   No significant CAD  . CATARACT EXTRACTION, BILATERAL Bilateral 2015  . CHOLECYSTECTOMY OPEN    . ILIAC ARTERY STENT Bilateral 2002   [place]  . JOINT REPLACEMENT    . [place] SURGERY  1999  . REVISION TOTAL HIP ARTHROPLASTY  1994  . SKIN CANCER EXCISION  2016   top of right forehead/head  . TONSILLECTOMY    . TOTAL HIP ARTHROPLASTY Right 1991  Current Outpatient Prescriptions  Medication Sig Dispense Refill  . Ascorbic Acid (VITAMIN C) 500 MG tablet Take 500 mg by mouth daily.      Marland Kitchen aspirin EC 81 MG tablet Take 81 mg by mouth 2 (two) times daily.    Marland Kitchen b complex vitamins tablet Take 1 tablet by mouth daily.      . Cholecalciferol (VITAMIN D3) 2000 UNITS capsule Take 2,000 Units by mouth daily.      . fish oil-omega-3 fatty acids 1000 MG capsule Take 2 g by mouth 2 (two) times daily.     . furosemide (LASIX) 20 MG tablet TAKE 3 TABLETS (60 MG TOTAL) BY MOUTH DAILY. 90 tablet 3  . gabapentin (NEURONTIN) 300 MG capsule TAKE 2 CAPSULES BY MOUTH AT BEDTIME 60 capsule 11  . glucose blood (ONE TOUCH ULTRA TEST) test strip Use to check blood sugar three times a day. Dx E11.9 (Patient taking differently: 1 each by Other  route 4 (four) times daily. Use to check blood sugar three times a day. Dx E11.9) 270 each 3  . Insulin Infusion Pump Supplies (INSET INFUSION SET 23" 6MM) MISC 1 Device by Does not apply route every 3 (three) days.    . Insulin Infusion Pump Supplies (INSULIN PUMP SYRINGE RESERVOIR) MISC 1 Device by Does not apply route every 3 (three) days. Animas 2 ml    . insulin lispro (HUMALOG) 100 UNIT/ML injection Inject via insulin pump for a total of 70 units per day 70 mL 3  . metoprolol (LOPRESSOR) 100 MG tablet TAKE 1 TABLET (100 MG TOTAL) BY MOUTH 2 (TWO) TIMES DAILY. 60 tablet 11  . omeprazole (PRILOSEC) 40 MG capsule Take 1 capsule by mouth  daily before breakfast 90 capsule 2  . pravastatin (PRAVACHOL) 20 MG tablet TAKE 1 TABLET BY MOUTH IN THE EVENING 90 tablet 1  . Probiotic Product (PROBIOTIC PO) Take 1 tablet by mouth daily at 10 pm.    . traMADol (ULTRAM) 50 MG tablet TAKE 2 TABLETS BY MOUTH EVERY 6 HOURS AS NEEDED FOR PAIN 120 tablet 3  . valsartan (DIOVAN) 320 MG tablet TAKE 1 TABLET (320 MG TOTAL) BY MOUTH AT BEDTIME. 90 tablet 0   No current facility-administered medications for this visit.     Allergies:   Carvedilol; Augmentin [amoxicillin-pot clavulanate]; Clindamycin/lincomycin; Diltiazem hcl; Fenofibrate; Hctz [hydrochlorothiazide]; Nisoldipine; Nitroglycerin; Propranolol; Septra [sulfamethoxazole-trimethoprim]; Tekturna [aliskiren fumarate]; and Valturna [aliskiren-valsartan]    Social History:  The patient  reports that she quit smoking about 30 years ago. Her smoking use included Cigarettes. She has a 74.00 pack-year smoking history. She has never used smokeless tobacco. She reports that she does not drink alcohol or use drugs.   Family History:  The patient's family history includes Diabetes in her father and other.    ROS:  General:no colds or fevers, no weight changes Skin:no rashes or ulcers HEENT:no blurred vision, no congestion CV:see HPI PUL:see HPI GI:no diarrhea  constipation or melena, no indigestion GU:no hematuria, no dysuria MS:no joint pain, no claudication Neuro:no syncope, no lightheadedness Endo:+ diabetes stable though does run high.  no thyroid disease  Wt Readings from Last 3 Encounters:  04/12/16 142 lb 1.9 oz (64.5 kg)  03/06/16 152 lb (68.9 kg)  02/22/16 152 lb 12 oz (69.3 kg)     PHYSICAL EXAM: VS:  BP 140/72   Pulse 61   Ht 5\' 4"  (1.626 m)   Wt 142 lb 1.9 oz (64.5 kg)   BMI 24.39 kg/m  ,  BMI Body mass index is 24.39 kg/m. General:Pleasant affect, NAD Skin:Warm and dry, brisk capillary refill HEENT:normocephalic, sclera clear, mucus membranes moist Neck:supple, no JVD, no bruits  Heart:irreg irreg without murmur, gallup, rub or click Lungs: with bilateral rales, no rhonchi, or wheezes [id number], non tender, + BS, do not palpate liver spleen or masses Ext:1+ lower ext edema,  2+ radial pulses Neuro:alert and oriented x 3, MAE, follows commands, + facial symmetry    EKG:  EKG is ordered today. The ekg ordered today demonstrates A fib no acute changes.  Rate controlled.  LAD.     Recent Labs: 08/31/2015: BUN 57; Creatinine, Ser 1.56; Hemoglobin 13.3; Platelets 218.0; Potassium 5.1; Sodium 140; TSH 1.90    Lipid Panel    Component Value Date/Time   CHOL 117 08/31/2015 1613   TRIG 133.0 08/31/2015 1613   HDL 48.80 08/31/2015 1613   CHOLHDL 2 08/31/2015 1613   VLDL 26.6 08/31/2015 1613   LDLCALC 42 08/31/2015 1613       Other studies Reviewed: Additional studies/ records that were reviewed today include: .   ASSESSMENT AND PLAN:  1.  CHF acute diastolic HF.  We will increase the lasix to 60 mg BID.  For 2 days only, she does not like to increase lasix, but to pull off fluid is necessary.  Have instructed her to decrease salt as well.  Check labs today. Follow up in 2 weeks, though she should call Monday if no improvement of symptoms.  2.     Essential HTN controlled  3.  SOB  4. Chronic a fib. No  anticoagulation due to falls.     Current medicines are reviewed with the patient today.  The patient Has no concerns regarding medicines.  The following changes have been made:  See above Labs/ tests ordered today include:see above  Disposition:   FU:  see above  Signed, Cecilie Kicks, NP  04/12/2016 11:27 AM    [facility], [place], [place], [place] Phone: [phone]; Fax: [phone]

## 2016-04-12 ENCOUNTER — Ambulatory Visit (INDEPENDENT_AMBULATORY_CARE_PROVIDER_SITE_OTHER): Payer: Medicare Other | Admitting: Cardiology

## 2016-04-12 ENCOUNTER — Encounter: Payer: Self-pay | Admitting: Cardiology

## 2016-04-12 VITALS — BP 140/72 | HR 61 | Ht 64.0 in | Wt 142.1 lb

## 2016-04-12 DIAGNOSIS — R0602 Shortness of breath: Secondary | ICD-10-CM | POA: Diagnosis not present

## 2016-04-12 DIAGNOSIS — I10 Essential (primary) hypertension: Secondary | ICD-10-CM

## 2016-04-12 DIAGNOSIS — I5032 Chronic diastolic (congestive) heart failure: Secondary | ICD-10-CM

## 2016-04-12 DIAGNOSIS — I5031 Acute diastolic (congestive) heart failure: Secondary | ICD-10-CM | POA: Diagnosis not present

## 2016-04-12 DIAGNOSIS — I482 Chronic atrial fibrillation, unspecified: Secondary | ICD-10-CM

## 2016-04-12 DIAGNOSIS — I872 Venous insufficiency (chronic) (peripheral): Secondary | ICD-10-CM

## 2016-04-12 LAB — CBC
HCT: 39.7 % (ref 35.0–45.0)
HEMOGLOBIN: 13.2 g/dL (ref 11.7–15.5)
MCH: 32.3 pg (ref 27.0–33.0)
MCHC: 33.2 g/dL (ref 32.0–36.0)
MCV: 97.1 fL (ref 80.0–100.0)
MPV: 9.2 fL (ref 7.5–12.5)
PLATELETS: 184 10*3/uL (ref 140–400)
RBC: 4.09 MIL/uL (ref 3.80–5.10)
RDW: 17.2 % — ABNORMAL HIGH (ref 11.0–15.0)
WBC: 7 10*3/uL (ref 3.8–10.8)

## 2016-04-12 LAB — BASIC METABOLIC PANEL
BUN: 56 mg/dL — AB (ref 7–25)
CO2: 28 mmol/L (ref 20–31)
Calcium: 10.8 mg/dL — ABNORMAL HIGH (ref 8.6–10.4)
Chloride: 103 mmol/L (ref 98–110)
Creat: 1.41 mg/dL — ABNORMAL HIGH (ref 0.60–0.88)
GLUCOSE: 158 mg/dL — AB (ref 65–99)
Potassium: 4.7 mmol/L (ref 3.5–5.3)
Sodium: 140 mmol/L (ref 135–146)

## 2016-04-12 LAB — BRAIN NATRIURETIC PEPTIDE: BRAIN NATRIURETIC PEPTIDE: 305.2 pg/mL — AB (ref <100)

## 2016-04-12 NOTE — Patient Instructions (Signed)
Your physician has recommended you make the following change in your medication: take 60 mg ( 3 of the 20 mg tablets) of furosemide twice a day for 2 days then resume your regular dose.  Your physician recommends that you have lab work today before leaving your appointment.  Your physician recommends that you schedule a follow-up appointment the week September 5th with any available extender.

## 2016-04-15 ENCOUNTER — Other Ambulatory Visit (HOSPITAL_COMMUNITY): Payer: Self-pay | Admitting: Internal Medicine

## 2016-04-16 ENCOUNTER — Telehealth: Payer: Self-pay | Admitting: Cardiovascular Disease

## 2016-04-16 NOTE — Telephone Encounter (Signed)
Pt states she was told to call today to report how she was feeling.   Pt states her shortness of breath has improved and in general she is feeling better.  Pt states no change in her weight, it has been 145 lbs and that is her normal weight.   Pt states she increased lasix for 2 days, is back on her usual lasix dose now.  Pt advised I will forward to Mickel Baas for review.

## 2016-04-16 NOTE — Telephone Encounter (Signed)
So if she is back to her baseline that is great.  Remind her to watch the salt in her diet.

## 2016-04-16 NOTE — Telephone Encounter (Signed)
Mrs. Neyland is calling because she was told to call today and to let you know how she ws doing today . Please call

## 2016-04-17 NOTE — Telephone Encounter (Signed)
Reminded pt to limit salt in her diet.

## 2016-04-22 ENCOUNTER — Other Ambulatory Visit: Payer: Self-pay | Admitting: Internal Medicine

## 2016-04-24 ENCOUNTER — Encounter: Payer: Self-pay | Admitting: Physician Assistant

## 2016-04-26 ENCOUNTER — Ambulatory Visit (INDEPENDENT_AMBULATORY_CARE_PROVIDER_SITE_OTHER): Payer: Medicare Other | Admitting: Physician Assistant

## 2016-04-26 ENCOUNTER — Other Ambulatory Visit: Payer: Self-pay | Admitting: Physician Assistant

## 2016-04-26 ENCOUNTER — Encounter: Payer: Self-pay | Admitting: Physician Assistant

## 2016-04-26 VITALS — BP 126/68 | HR 62 | Ht 64.0 in | Wt 150.8 lb

## 2016-04-26 DIAGNOSIS — I5032 Chronic diastolic (congestive) heart failure: Secondary | ICD-10-CM

## 2016-04-26 DIAGNOSIS — I5031 Acute diastolic (congestive) heart failure: Secondary | ICD-10-CM

## 2016-04-26 DIAGNOSIS — I10 Essential (primary) hypertension: Secondary | ICD-10-CM | POA: Diagnosis not present

## 2016-04-26 DIAGNOSIS — I482 Chronic atrial fibrillation, unspecified: Secondary | ICD-10-CM

## 2016-04-26 DIAGNOSIS — I739 Peripheral vascular disease, unspecified: Secondary | ICD-10-CM

## 2016-04-26 DIAGNOSIS — R0602 Shortness of breath: Secondary | ICD-10-CM | POA: Diagnosis not present

## 2016-04-26 DIAGNOSIS — I6523 Occlusion and stenosis of bilateral carotid arteries: Secondary | ICD-10-CM

## 2016-04-26 LAB — BASIC METABOLIC PANEL
BUN: 61 mg/dL — ABNORMAL HIGH (ref 7–25)
CALCIUM: 10.5 mg/dL — AB (ref 8.6–10.4)
CO2: 29 mmol/L (ref 20–31)
CREATININE: 1.67 mg/dL — AB (ref 0.60–0.88)
Chloride: 103 mmol/L (ref 98–110)
Glucose, Bld: 43 mg/dL — ABNORMAL LOW (ref 65–99)
Potassium: 4.5 mmol/L (ref 3.5–5.3)
SODIUM: 141 mmol/L (ref 135–146)

## 2016-04-26 MED ORDER — POTASSIUM CHLORIDE ER 10 MEQ PO TBCR: EXTENDED_RELEASE_TABLET | ORAL | 1 refills | Status: DC

## 2016-04-26 MED ORDER — FUROSEMIDE 80 MG PO TABS
ORAL_TABLET | ORAL | 0 refills | Status: DC
Start: 2016-04-26 — End: 2016-05-02

## 2016-04-26 NOTE — Patient Instructions (Addendum)
Medication Instructions:  Your physician has recommended you make the following change in your medication:  1.  INCREASE the Lasix to 80 mg taking 1 tablet by mouth twice a day X's 3 days then go to 1 tablet daily 2.  START the Potassium 10 meq taking 2 tablets by mouth twice a day X's 3 days then go to 2 tablets by mouth daily  Labwork: BMET  Testing/Procedures: None ordered  Follow-Up: Your physician recommends that you schedule a follow-up appointment in: 05/02/16 ARRIVE AT 10:45 FOR AN 11:00 APPT.   Any Other Special Instructions Will Be Listed Below (If Applicable).    If you need a refill on your cardiac medications before your next appointment, please call your pharmacy.

## 2016-04-26 NOTE — Progress Notes (Signed)
Cardiology Office Note    Date:  04/26/2016   ID:  Emma Yu, DOB 1926-09-06, MRN [id number]  PCP:  [name], MD  Cardiologist:  Dr. Fletcher [name]    CC: follow up of CHF  History of Present Illness:  Emma Yu is a 82 y.o. female with a history of chronic atrial fibrillation not on anticoagulation due to recurrent falls, chronic diastolic CHF, chronic venous insufficiency with significant stasis dermatitis, PAD s/p bilateral common iliac artery stenting (2002)with known significant right SFA disease, DMT1, CKD, HTN and HLD who presents to clinic for follow up of A/C D CHF.  LHC 01/10/11: Luminal irregularities. Most recent echo in 08/2015 showed normal EF, mild AI and calcified aortic valve without significant stenosis, and MAC with mild MS.  She was seen by Cecilie Kicks NP on 04/12/16 for SOB and CP. BNP was 305. Creat 1.41. She was felt to be volume overloaded and lasix increase to 60mg  BID x 2 days and then put back on chronic lasix 60mg  daily.   Today she presents to clinic for follow up. She did feel better after the lasix 60mg  BID x 2 days and her SOB and chest tightness improved. Her weight did not change much but she did feel better. Since that she has progressively gained more weight (142--> 150lbs by our scales) and feeling more SOB and chest tightness. She notices most of these symptoms with exertion. No orthopnea or PND. She does have some LE edema. No dizziness or syncope.     Past Medical History:  Diagnosis Date  . Acute respiratory failure ([place])    12/2011 admission - decreased O2 sats on ambulation, improved by time of discharge  . Aortic stenosis    Moderate by echo 4/13 - mean 20 mmHg  . Arthritis    "back" (08/24/2015)  . Atrial fibrillation ([place])    not a coumadin candidate secondary to fall risk  . CAD (coronary artery disease)    minimal plaque by cath 5/12  . CHF (congestive heart failure) (HCC)    EF 55-60%  . Chronic atrial fibrillation with  RVR (HCC)   . Chronic diastolic heart failure (HCC)    Echocardiogram 12/10/11: Moderate LVH, EF 65-70%, moderate aortic stenosis, mean gradient 20, mild MS  . Chronic mid back pain   . DJD (degenerative joint disease) of hip    s/p R THR 10/2010  . DVT (deep venous thrombosis) ([place]) ~ 2012   LLE  . History of blood transfusion 1957   "related to childbirth"  . Hyperlipidemia   . Hypertension   . Insulin pump in place   . Iron deficiency anemia   . Kidney stones ~ 1958   "no OR"  . Migraine    "none since my hysterectomy" (08/24/2015)  . Mitral stenosis    Mild by echo 4/13  . Myocardial infarction ([place]) 12/2010  . PAD (peripheral artery disease) (HCC)    s/p bilateral comon iliac artery stenting in 2002. Known significant  R SFA  disease. carotid dz noted 11/2011 followed by Dr. Bridgett Larsson  . Skin cancer    right forehead/head  . Type II diabetes mellitus ([place]) dx'd 1999  . Venous insufficiency     Past Surgical History:  Procedure Laterality Date  . ABDOMINAL HYSTERECTOMY    . APPENDECTOMY    . BACK SURGERY    . CARDIAC CATHETERIZATION  01/10/2011   No significant CAD  . CATARACT EXTRACTION, BILATERAL Bilateral 2015  . CHOLECYSTECTOMY  OPEN    . ILIAC ARTERY STENT Bilateral 2002   [place]  . JOINT REPLACEMENT    . [place] SURGERY  1999  . REVISION TOTAL HIP ARTHROPLASTY  1994  . SKIN CANCER EXCISION  2016   top of right forehead/head  . TONSILLECTOMY    . TOTAL HIP ARTHROPLASTY Right 1991    Current Medications: Outpatient Medications Prior to Visit  Medication Sig Dispense Refill  . Ascorbic Acid (VITAMIN C) 500 MG tablet Take 500 mg by mouth daily.      Marland Kitchen aspirin EC 81 MG tablet Take 81 mg by mouth 2 (two) times daily.    Marland Kitchen b complex vitamins tablet Take 1 tablet by mouth daily.      . Cholecalciferol (VITAMIN D3) 2000 UNITS capsule Take 2,000 Units by mouth daily.      . fish oil-omega-3 fatty acids 1000 MG capsule Take 2 g by mouth 2 (two) times daily.     Marland Kitchen  gabapentin (NEURONTIN) 300 MG capsule TAKE 2 CAPSULES BY MOUTH AT BEDTIME 60 capsule 11  . Insulin Infusion Pump Supplies (INSET INFUSION SET 23" 6MM) MISC 1 Device by Does not apply route every 3 (three) days.    . Insulin Infusion Pump Supplies (INSULIN PUMP SYRINGE RESERVOIR) MISC 1 Device by Does not apply route every 3 (three) days. Animas 2 ml    . insulin lispro (HUMALOG) 100 UNIT/ML injection Inject via insulin pump for a total of 70 units per day 70 mL 3  . metoprolol (LOPRESSOR) 100 MG tablet TAKE 1 TABLET (100 MG TOTAL) BY MOUTH 2 (TWO) TIMES DAILY. 60 tablet 11  . omeprazole (PRILOSEC) 40 MG capsule Take 1 capsule by mouth  daily before breakfast 90 capsule 2  . pravastatin (PRAVACHOL) 20 MG tablet TAKE 1 TABLET BY MOUTH IN THE EVENING 90 tablet 1  . Probiotic Product (PROBIOTIC PO) Take 1 tablet by mouth daily at 10 pm.    . traMADol (ULTRAM) 50 MG tablet TAKE 2 TABLETS BY MOUTH EVERY 6 HOURS AS NEEDED FOR PAIN 120 tablet 3  . valsartan (DIOVAN) 320 MG tablet TAKE 1 TABLET (320 MG TOTAL) BY MOUTH AT BEDTIME. 90 tablet 3  . furosemide (LASIX) 20 MG tablet TAKE 3 TABLETS (60 MG TOTAL) BY MOUTH DAILY. 90 tablet 3  . glucose blood (ONE TOUCH ULTRA TEST) test strip Use to check blood sugar three times a day. Dx E11.9 (Patient taking differently: 1 each by Other route 4 (four) times daily. Use to check blood sugar three times a day. Dx E11.9) 270 each 3   No facility-administered medications prior to visit.      Allergies:   Carvedilol; Augmentin [amoxicillin-pot clavulanate]; Clindamycin/lincomycin; Diltiazem hcl; Fenofibrate; Hctz [hydrochlorothiazide]; Nisoldipine; Nitroglycerin; Propranolol; Septra [sulfamethoxazole-trimethoprim]; Tekturna [aliskiren fumarate]; and [place] [aliskiren-valsartan]   Social History   Social History  . Marital status: Widowed    Spouse name: N/A  . Number of children: N/A  . Years of education: N/A   Occupational History  .  Retired    retired    Social History Main Topics  . Smoking status: Former Smoker    Packs/day: 2.00    Years: 37.00    Types: Cigarettes    Quit date: 08/20/1985  . Smokeless tobacco: Never Used  . Alcohol use No  . Drug use: No  . Sexual activity: No   Other Topics Concern  . None   Social History Narrative   Lives with son   Widowed  1991   Quit smoking in 1987     Family History:  The patient's family history includes Diabetes in her father and other.     ROS:   Please see the history of present illness.    ROS All other systems reviewed and are negative.   PHYSICAL EXAM:   VS:  BP 126/68   Pulse 62   Ht 5\' 4"  (1.626 m)   Wt 150 lb 12.8 oz (68.4 kg)   BMI 25.88 kg/m    GEN: Well nourished, well developed, in no acute distress  HEENT: normal  Neck: no JVD, carotid bruits, or masses Cardiac: irreg irreg; no murmurs, rubs, or gallops, 1+ bilateral  edema  Respiratory:  clear to auscultation bilaterally, normal work of breathing GI: soft, nontender, nondistended, + BS MS: no deformity or atrophy  Skin: warm and dry, no rash Neuro:  Alert and Oriented x 3, Strength and sensation are intact Psych: euthymic mood, full affect  Wt Readings from Last 3 Encounters:  04/26/16 150 lb 12.8 oz (68.4 kg)  04/12/16 142 lb 1.9 oz (64.5 kg)  03/06/16 152 lb (68.9 kg)      Studies/Labs Reviewed:   EKG:  EKG is NOT ordered today.    Recent Labs: 08/31/2015: TSH 1.90 04/12/2016: Brain Natriuretic Peptide 305.2; BUN 56; Creat 1.41; Hemoglobin 13.2; Platelets 184; Potassium 4.7; Sodium 140   Lipid Panel    Component Value Date/Time   CHOL 117 08/31/2015 1613   TRIG 133.0 08/31/2015 1613   HDL 48.80 08/31/2015 1613   CHOLHDL 2 08/31/2015 1613   VLDL 26.6 08/31/2015 1613   LDLCALC 42 08/31/2015 1613    Additional studies/ records that were reviewed today include:  2D ECHO: 08/25/2015 LV EF: 65% -   70% Study Conclusions - Left ventricle: The cavity size was normal. Wall thickness was    increased in a pattern of moderate LVH. Systolic function was   vigorous. The estimated ejection fraction was in the range of 65%   to 70%. Wall motion was normal; there were no regional wall   motion abnormalities. - Aortic valve: There was mild regurgitation. - Mitral valve: Calcified annulus. There was mild regurgitation. - Left atrium: The atrium was severely dilated. - Right atrium: The atrium was mildly dilated. - Pericardium, extracardiac: A trivial pericardial effusion was   identified. Impressions: - Vigorous LV function; moderate LVH; biatrial enlargement;   calcified aortic valve with no AS by mean gradient; mild AI; MAC   with mild MS.   ASSESSMENT & PLAN:   Emma Yu is a 82 y.o. female with a history of chronic atrial fibrillation not on anticoagulation due to recurrent falls, chronic diastolic CHF, chronic venous insufficiency with significant stasis dermatitis, PAD s/p bilateral common iliac artery stenting (2002)with known significant right SFA disease, DMT1, CKD, HTN and HLD who presents to clinic for follow up of A/C D CHF.  Acute on chronic diastolic CHF: still feels like she is retaining fluid and weight is up 8 lbs ( 142--> 150 lbs). Will increase lasix to 80mg  BID x3 day and also add a potassium supplementation 20 mEq BID x 3 days then go down to 80mg  Lasix daily and 64mEq Kdur daily. Will arrange for close outpatient follow up with Cecilie Kicks NP next week since I wont be in office.   CKD: creat 1.41 on 04/12/16: will recheck a BMET today   Chronic afib: rate well controlled on Lopressor 100mg  BID. No OAC due to high  risk of falls. CHADSVASC of at least 7 (CHF, HTN, DM, Age, F sex, vasc dz)  PAD: continue ASA and statin  HTN: BP well controlled currently   DMT1: continue insuline regimen. Hg A1c 8.0 in 08/2015  HLD: continue statin    Medication Adjustments/Labs and Tests Ordered: Current medicines are reviewed at length with the patient today.   Concerns regarding medicines are outlined above.  Medication changes, Labs and Tests ordered today are listed in the Patient Instructions below. Patient Instructions  Medication Instructions:  Your physician has recommended you make the following change in your medication:  1.  INCREASE the Lasix to 80 mg taking 1 tablet by mouth twice a day X's 3 days then go to 1 tablet daily 2.  START the Potassium 10 meq taking 2 tablets by mouth twice a day X's 3 days then go to 2 tablets by mouth daily  Labwork: BMET  Testing/Procedures: None ordered  Follow-Up: Your physician recommends that you schedule a follow-up appointment in: 05/02/16 ARRIVE AT  11   Any Other Special Instructions Will Be Listed Below (If Applicable).     If you need a refill on your cardiac medications before your next appointment, please call your pharmacy.      Signed, Angelena Form, PA-C  04/26/2016 3:42 PM    [facility] [address], [place], [place]  [address] Phone: [phone]; Fax: [phone]

## 2016-04-27 ENCOUNTER — Telehealth: Payer: Self-pay | Admitting: *Deleted

## 2016-04-27 DIAGNOSIS — R7989 Other specified abnormal findings of blood chemistry: Secondary | ICD-10-CM

## 2016-04-27 NOTE — Telephone Encounter (Signed)
Pt aware of lab results.  She will come 04/30/16 for repeat bmet. Order in epic. Pt verbalized understanding.

## 2016-04-27 NOTE — Telephone Encounter (Signed)
-----   Message from Eileen Stanford, PA-C sent at 04/27/2016  8:04 AM EDT ----- Creat is now a little more elevated. Lets check a BMET on Monday. Continue current plan for now

## 2016-04-30 ENCOUNTER — Other Ambulatory Visit: Payer: Medicare Other | Admitting: *Deleted

## 2016-04-30 DIAGNOSIS — R748 Abnormal levels of other serum enzymes: Secondary | ICD-10-CM | POA: Diagnosis not present

## 2016-04-30 DIAGNOSIS — R7989 Other specified abnormal findings of blood chemistry: Secondary | ICD-10-CM

## 2016-04-30 LAB — BASIC METABOLIC PANEL
BUN: 85 mg/dL — ABNORMAL HIGH (ref 7–25)
CALCIUM: 10.3 mg/dL (ref 8.6–10.4)
CHLORIDE: 102 mmol/L (ref 98–110)
CO2: 29 mmol/L (ref 20–31)
CREATININE: 2.18 mg/dL — AB (ref 0.60–0.88)
Glucose, Bld: 263 mg/dL — ABNORMAL HIGH (ref 65–99)
Potassium: 5.6 mmol/L — ABNORMAL HIGH (ref 3.5–5.3)
SODIUM: 141 mmol/L (ref 135–146)

## 2016-05-01 ENCOUNTER — Encounter: Payer: Self-pay | Admitting: Cardiology

## 2016-05-01 NOTE — Progress Notes (Signed)
Cardiology Office Note   Date:  05/02/2016   ID:  Emma Yu, DOB 12/22/1926, MRN [id number]  PCP:  Emma Calico, MD  Cardiologist:  Dr. Fletcher [name]  For [place] and Dr. Johnsie Cancel for cardiology  Chief Complaint  Patient presents with  . Shortness of Breath      History of Present Illness: Emma Yu is a 82 y.o. female who presents for follow up for SOB.    She has a history of chronic Atrial fibrillation not on anticoagulation due to recurrent falls, chronic diastolic heart failure, peripheral arterial disease, status post bilateral common iliac artery stenting in 2002 with known significant right SFA disease, diabetes, hypertension, hyperlipidemia. LHC 01/10/11: Luminal irregularities.   She walks very slowly with a walker.  She has known history of chronic venous insufficiency with significant stasis dermatitis. Most recent echo in 08/2015 showed normal EF, mild AI and calcified aortic valve without significant stenosis.  Type I DM  Today she complains of SOB for several days and chest pressure.  She had chest pressure before when she had CHF.  Overall her wt is down from previous visits.  She has had more salt recently  I increased her lasix for 2 days, which helped but then SOB and wt increased again.   She was seen by K. Thompson, PA and lasix increased for 80 BID X 3 days.  Labs with elevated K+ at 5.6 along with rise of BUN to 85 and Cr to 2.18.   Her diovan was stopped.    Today denies chest pressure or pain, and no more SOB.  Feels much better.  Is watching her salt intake.   [name] worried her BP will go up in the AM is [id number] systolic.  Currently stable with her lopressor.    Past Medical History:  Diagnosis Date  . Acute respiratory failure ([place])    12/2011 admission - decreased O2 sats on ambulation, improved by time of discharge  . Aortic stenosis    Moderate by echo 4/13 - mean 20 mmHg  . Arthritis    "back" (08/24/2015)  . Atrial fibrillation ([place])    not a  coumadin candidate secondary to fall risk  . CAD (coronary artery disease)    minimal plaque by cath 5/12  . CHF (congestive heart failure) (HCC)    EF 55-60%  . Chronic atrial fibrillation with RVR (HCC)   . Chronic diastolic heart failure (HCC)    Echocardiogram 12/10/11: Moderate LVH, EF 65-70%, moderate aortic stenosis, mean gradient 20, mild MS  . Chronic mid back pain   . DJD (degenerative joint disease) of hip    s/p R THR 10/2010  . DVT (deep venous thrombosis) ([place]) ~ 2012   LLE  . History of blood transfusion 1957   "related to childbirth"  . Hyperlipidemia   . Hypertension   . Insulin pump in place   . Iron deficiency anemia   . Kidney stones ~ 1958   "no OR"  . Migraine    "none since my hysterectomy" (08/24/2015)  . Mitral stenosis    Mild by echo 4/13  . Myocardial infarction ([place]) 12/2010  . PAD (peripheral artery disease) (HCC)    s/p bilateral comon iliac artery stenting in 2002. Known significant  R SFA  disease. carotid dz noted 11/2011 followed by Dr. Bridgett Larsson  . Skin cancer    right forehead/head  . Type II diabetes mellitus ([place]) dx'd 1999  . Venous insufficiency  Past Surgical History:  Procedure Laterality Date  . ABDOMINAL HYSTERECTOMY    . APPENDECTOMY    . BACK SURGERY    . CARDIAC CATHETERIZATION  01/10/2011   No significant CAD  . CATARACT EXTRACTION, BILATERAL Bilateral 2015  . CHOLECYSTECTOMY OPEN    . ILIAC ARTERY STENT Bilateral 2002   [place]  . JOINT REPLACEMENT    . [place] SURGERY  1999  . REVISION TOTAL HIP ARTHROPLASTY  1994  . SKIN CANCER EXCISION  2016   top of right forehead/head  . TONSILLECTOMY    . TOTAL HIP ARTHROPLASTY Right 1991     Current Outpatient Prescriptions  Medication Sig Dispense Refill  . Ascorbic Acid (VITAMIN C) 500 MG tablet Take 500 mg by mouth daily.      Marland Kitchen aspirin EC 81 MG tablet Take 81 mg by mouth 2 (two) times daily.    Marland Kitchen b complex vitamins tablet Take 1 tablet by mouth daily.      .  Cholecalciferol (VITAMIN D3) 2000 UNITS capsule Take 2,000 Units by mouth daily.      . fish oil-omega-3 fatty acids 1000 MG capsule Take 2 g by mouth 2 (two) times daily.     . furosemide (LASIX) 80 MG tablet Take 80 mg by mouth daily.    Marland Kitchen gabapentin (NEURONTIN) 300 MG capsule TAKE 2 CAPSULES BY MOUTH AT BEDTIME 60 capsule 11  . glucose blood test strip 1 each by Other route 4 (four) times daily. Use as instructed    . Insulin Infusion Pump Supplies (INSET INFUSION SET 23" 6MM) MISC 1 Device by Does not apply route every 3 (three) days.    . Insulin Infusion Pump Supplies (INSULIN PUMP SYRINGE RESERVOIR) MISC 1 Device by Does not apply route every 3 (three) days. Animas 2 ml    . insulin lispro (HUMALOG) 100 UNIT/ML injection Inject via insulin pump for a total of 70 units per day 70 mL 3  . metoprolol (LOPRESSOR) 100 MG tablet TAKE 1 TABLET (100 MG TOTAL) BY MOUTH 2 (TWO) TIMES DAILY. 60 tablet 11  . omeprazole (PRILOSEC) 40 MG capsule Take 1 capsule by mouth  daily before breakfast 90 capsule 2  . pravastatin (PRAVACHOL) 20 MG tablet TAKE 1 TABLET BY MOUTH IN THE EVENING 90 tablet 1  . Probiotic Product (PROBIOTIC PO) Take 1 tablet by mouth daily at 10 pm.    . traMADol (ULTRAM) 50 MG tablet TAKE 2 TABLETS BY MOUTH EVERY 6 HOURS AS NEEDED FOR PAIN 120 tablet 3   No current facility-administered medications for this visit.     Allergies:   Carvedilol; Augmentin [amoxicillin-pot clavulanate]; Clindamycin/lincomycin; Diltiazem hcl; Fenofibrate; Hctz [hydrochlorothiazide]; Nisoldipine; Nitroglycerin; Propranolol; Septra [sulfamethoxazole-trimethoprim]; Tekturna [aliskiren fumarate]; and [place] [aliskiren-valsartan]    Social History:  The patient  reports that she quit smoking about 30 years ago. Her smoking use included Cigarettes. She has a 74.00 pack-year smoking history. She has never used smokeless tobacco. She reports that she does not drink alcohol or use drugs.   Family History:  The  patient's family history includes Diabetes in her father and other.    ROS:  General:no colds or fevers, + weight loss of 2 lbs.   Skin:no rashes or ulcers HEENT:no blurred vision, no congestion CV:see HPI PUL:see HPI GI:no diarrhea constipation or melena, no indigestion GU:no hematuria, no dysuria MS:no joint pain, no claudication Neuro:no syncope, no lightheadedness Endo:+ diabetes, no thyroid disease  Wt Readings from Last 3 Encounters:  05/02/16  148 lb 1.9 oz (67.2 kg)  04/26/16 150 lb 12.8 oz (68.4 kg)  04/12/16 142 lb 1.9 oz (64.5 kg)     PHYSICAL EXAM: VS:  BP (!) 120/56   Pulse 62   Ht 5\' 4"  (1.626 m)   Wt 148 lb 1.9 oz (67.2 kg)   BMI 25.42 kg/m  , BMI Body mass index is 25.42 kg/m. General:Pleasant affect, NAD Skin:Warm and dry, brisk capillary refill HEENT:normocephalic, sclera clear, mucus membranes moist Neck:supple, no JVD, no bruits  Heart:irreg irreg without murmur, gallup, rub or click Lungs:clear without rales, rhonchi, or wheezes [id number], non tender, + BS, do not palpate liver spleen or masses Ext:no lower ext edema, 2+ pedal pulses, 2+ radial pulses Neuro:alert and oriented X 3, MAE, follows commands, + facial symmetry    EKG:  EKG is NOT ordered today.    Recent Labs: 08/31/2015: TSH 1.90 04/12/2016: Brain Natriuretic Peptide 305.2; Hemoglobin 13.2; Platelets 184 04/30/2016: BUN 85; Creat 2.18; Potassium 5.6; Sodium 141    Lipid Panel    Component Value Date/Time   CHOL 117 08/31/2015 1613   TRIG 133.0 08/31/2015 1613   HDL 48.80 08/31/2015 1613   CHOLHDL 2 08/31/2015 1613   VLDL 26.6 08/31/2015 1613   LDLCALC 42 08/31/2015 1613       Other studies Reviewed: Additional studies/ records that were reviewed today include: Echo Study Conclusions  - Left ventricle: The cavity size was normal. Wall thickness was   increased in a pattern of moderate LVH. Systolic function was   vigorous. The estimated ejection fraction was in the range  of 65%   to 70%. Wall motion was normal; there were no regional wall   motion abnormalities. - Aortic valve: There was mild regurgitation. - Mitral valve: Calcified annulus. There was mild regurgitation. - Left atrium: The atrium was severely dilated. - Right atrium: The atrium was mildly dilated. - Pericardium, extracardiac: A trivial pericardial effusion was   identified.  Impressions:  - Vigorous LV function; moderate LVH; biatrial enlargement;   calcified aortic valve with no AS by mean gradient; mild AI; MAC   with mild MS.  ASSESSMENT AND PLAN:  1. .  CHF acute diastolic HF.  with diuresing her SOB has improved.  Now on lasix 80 once daily.    2. Acute on chronic renal failure with elevated K+ hyperkalemia.  Her K+ has been stopped and diovan held though she would like to resume diovan.  Will continue to hold for now and check labs today.  If K+ and Cr improve may be able to resume at lower dose.  2.     Essential HTN controlled  3.  SOB  Improved.  4. Chronic a fib. No anticoagulation due to falls.    Follow up with Dr. Johnsie Cancel at pt;s request in 3 months.  Current medicines are reviewed with the patient today.  The patient Has no concerns regarding medicines.  The following changes have been made:  See above Labs/ tests ordered today include:see above  Disposition:   FU:  see above  Signed, Cecilie Kicks, NP  05/02/2016 2:19 PM    [facility] [place], [place], [place] [address], [place] Phone: [phone]; Fax: [phone]

## 2016-05-02 ENCOUNTER — Telehealth: Payer: Self-pay | Admitting: Emergency Medicine

## 2016-05-02 ENCOUNTER — Ambulatory Visit (INDEPENDENT_AMBULATORY_CARE_PROVIDER_SITE_OTHER): Payer: Medicare Other | Admitting: Cardiology

## 2016-05-02 ENCOUNTER — Encounter: Payer: Self-pay | Admitting: Cardiology

## 2016-05-02 ENCOUNTER — Telehealth: Payer: Self-pay | Admitting: Endocrinology

## 2016-05-02 VITALS — BP 120/56 | HR 62 | Ht 64.0 in | Wt 148.1 lb

## 2016-05-02 DIAGNOSIS — I482 Chronic atrial fibrillation, unspecified: Secondary | ICD-10-CM

## 2016-05-02 DIAGNOSIS — I5032 Chronic diastolic (congestive) heart failure: Secondary | ICD-10-CM

## 2016-05-02 DIAGNOSIS — I10 Essential (primary) hypertension: Secondary | ICD-10-CM

## 2016-05-02 DIAGNOSIS — I5033 Acute on chronic diastolic (congestive) heart failure: Secondary | ICD-10-CM | POA: Diagnosis not present

## 2016-05-02 DIAGNOSIS — R0602 Shortness of breath: Secondary | ICD-10-CM | POA: Diagnosis not present

## 2016-05-02 DIAGNOSIS — E875 Hyperkalemia: Secondary | ICD-10-CM | POA: Diagnosis not present

## 2016-05-02 DIAGNOSIS — E109 Type 1 diabetes mellitus without complications: Secondary | ICD-10-CM | POA: Diagnosis not present

## 2016-05-02 LAB — BASIC METABOLIC PANEL
BUN: 82 mg/dL — ABNORMAL HIGH (ref 7–25)
CALCIUM: 10.2 mg/dL (ref 8.6–10.4)
CHLORIDE: 102 mmol/L (ref 98–110)
CO2: 27 mmol/L (ref 20–31)
CREATININE: 1.73 mg/dL — AB (ref 0.60–0.88)
Glucose, Bld: 285 mg/dL — ABNORMAL HIGH (ref 65–99)
Potassium: 5.2 mmol/L (ref 3.5–5.3)
Sodium: 139 mmol/L (ref 135–146)

## 2016-05-02 NOTE — Patient Instructions (Addendum)
Medication Instructions:  Your physician recommends that you continue on your current medications as directed. Please refer to the Current Medication list given to you today.   Labwork: TODAY: BMET  Testing/Procedures: None  Follow-Up: Your physician recommends that you keep your appointment with Dr. Johnsie Cancel on 08/02/16 at 2:45 PM.  Any Other Special Instructions Will Be Listed Below (If Applicable).     If you need a refill on your cardiac medications before your next appointment, please call your pharmacy.

## 2016-05-02 NOTE — Telephone Encounter (Signed)
[place] called and found out the information they needed to find out at Dr. Rosario Adie office. Pt needs to call [facility] and they need to sent a form over to Dr Ronnald Ramp. This is what she needs to do. Please give her a call back thanks.

## 2016-05-02 NOTE — Telephone Encounter (Signed)
error 

## 2016-05-03 ENCOUNTER — Other Ambulatory Visit: Payer: Self-pay | Admitting: *Deleted

## 2016-05-03 DIAGNOSIS — E875 Hyperkalemia: Secondary | ICD-10-CM

## 2016-05-03 NOTE — Telephone Encounter (Signed)
Pt and young woman spoke to [place] and were told that they should see Dr. Loanne Drilling to receive the supplies needed sooner and then have [place] send [place] the form and we can take over rxing supplies once we receive the request.   Pt stated understanding.

## 2016-05-04 ENCOUNTER — Telehealth: Payer: Self-pay | Admitting: Cardiology

## 2016-05-04 MED ORDER — AMLODIPINE BESYLATE 5 MG PO TABS: 5.0000 mg | ORAL_TABLET | Freq: Every day | ORAL | 3 refills | Status: DC

## 2016-05-04 NOTE — Telephone Encounter (Signed)
Patient calling about her BP being elevated. Patient stated she was suppose to call if her SBP was over 150. Patient stated her BP 157/71 and HR 64. Patient stated she stopped taking Diovan and started taking Lasix 80 mg daily as instructed at her office visit and lab results. Will forward to Cecilie Kicks NP for further advisement.

## 2016-05-04 NOTE — Telephone Encounter (Signed)
She could take oral dilt without problems so we should try amlodipine.  Thanks.

## 2016-05-04 NOTE — Telephone Encounter (Signed)
Called patient back with instructions and patient will come in for her lab work and BP check on 05/17/16. Informed patient to call our office if she has any questions or concerns with the new medication. Patient verbalized understanding.

## 2016-05-04 NOTE — Telephone Encounter (Signed)
New Message:     Pt says her blood pressure is 157/71 and pulse rate is 64. She said Mickel Baas told her to call if it got over 150.Please call to advise.

## 2016-05-04 NOTE — Telephone Encounter (Signed)
Patient has an allergy history to Nisoldipine and Diltiazem with reaction being chest pain. Talked to [facility] Pharmacist, she stated that it would be up to the provider. Will forward back to Cecilie Kicks NP.

## 2016-05-04 NOTE — Telephone Encounter (Signed)
Pt's labs still abnormal.  So instead of diovan use amlodipine 5 mg daily.  And BP check with labs next week. (see lab results for date)  Thanks.

## 2016-05-16 NOTE — Progress Notes (Signed)
Patient ID: Emma Yu                 DOB: 07-30-1927                      MRN: [id number]     HPI: Emma Yu is a 82 y.o. female referred by Cecilie Kicks, NP to HTN clinic. PMH is significant for afib, chronic diastoic heart fialure, PAD, s/p bilateral common iliac artery stenting in 2002, DM, HTN, and HLD. Pt called clinic with elevated BP readings 2 weeks ago with reported SBP > 150.She was started on amlodipine daily and presents today for f/u. Of note, pt has multiple BP medication intolerances.  Denies dizziness, headache, or falls at home. She checks her BP at home 4x per day. ~60-70% of readings are at goal with many SBP in the 120 and 130s. Does have readings of 170-180s as well. These are likely inaccurate due to patient's afib. She also uses a wrist cuff.   Current HTN meds: amlodipine 5mg  daily, furosemide 80mg  daily, metoprolol 100mg  BID Previously tried: carvedilol, diltiazem, HCTZ, nisoldipine, propranolol - chest pain. Aliskiren/valsartan - unknown BP goal: <150/80mmHg given patient's age and history of falls.  Family History: Father and 5 of 8 siblings with hx of diabetes.  Social History: Quit smoking in 1987, had previously smoked 2 PPD for 37 years. Denies alcohol or illicit drug use. :   Wt Readings from Last 3 Encounters:  05/02/16 148 lb 1.9 oz (67.2 kg)  04/26/16 150 lb 12.8 oz (68.4 kg)  04/12/16 142 lb 1.9 oz (64.5 kg)   BP Readings from Last 3 Encounters:  05/02/16 (!) 120/56  04/26/16 126/68  04/12/16 140/72   Pulse Readings from Last 3 Encounters:  05/02/16 62  04/26/16 62  04/12/16 61    Renal function: CrCl cannot be calculated (Unknown ideal weight.).  Past Medical History:  Diagnosis Date  . Acute respiratory failure ([place])    12/2011 admission - decreased O2 sats on ambulation, improved by time of discharge  . Aortic stenosis    Moderate by echo 4/13 - mean 20 mmHg  . Arthritis    "back" (08/24/2015)  . Atrial fibrillation  ([place])    not a coumadin candidate secondary to fall risk  . CAD (coronary artery disease)    minimal plaque by cath 5/12  . CHF (congestive heart failure) (HCC)    EF 55-60%  . Chronic atrial fibrillation with RVR (HCC)   . Chronic diastolic heart failure (HCC)    Echocardiogram 12/10/11: Moderate LVH, EF 65-70%, moderate aortic stenosis, mean gradient 20, mild MS  . Chronic mid back pain   . DJD (degenerative joint disease) of hip    s/p R THR 10/2010  . DVT (deep venous thrombosis) ([place]) ~ 2012   LLE  . History of blood transfusion 1957   "related to childbirth"  . Hyperlipidemia   . Hypertension   . Insulin pump in place   . Iron deficiency anemia   . Kidney stones ~ 1958   "no OR"  . Migraine    "none since my hysterectomy" (08/24/2015)  . Mitral stenosis    Mild by echo 4/13  . Myocardial infarction ([place]) 12/2010  . PAD (peripheral artery disease) (HCC)    s/p bilateral comon iliac artery stenting in 2002. Known significant  R SFA  disease. carotid dz noted 11/2011 followed by Dr. Bridgett Larsson  . Skin cancer    right forehead/head  .  Type II diabetes mellitus ([place]) dx'd 1999  . Venous insufficiency     Current Outpatient Prescriptions on File Prior to Visit  Medication Sig Dispense Refill  . amLODipine (NORVASC) 5 MG tablet Take 1 tablet (5 mg total) by mouth daily. 90 tablet 3  . Ascorbic Acid (VITAMIN C) 500 MG tablet Take 500 mg by mouth daily.      Marland Kitchen aspirin EC 81 MG tablet Take 81 mg by mouth 2 (two) times daily.    Marland Kitchen b complex vitamins tablet Take 1 tablet by mouth daily.      . Cholecalciferol (VITAMIN D3) 2000 UNITS capsule Take 2,000 Units by mouth daily.      . fish oil-omega-3 fatty acids 1000 MG capsule Take 2 g by mouth 2 (two) times daily.     . furosemide (LASIX) 80 MG tablet Take 80 mg by mouth daily.    Marland Kitchen gabapentin (NEURONTIN) 300 MG capsule TAKE 2 CAPSULES BY MOUTH AT BEDTIME 60 capsule 11  . glucose blood test strip 1 each by Other route 4 (four) times daily.  Use as instructed    . Insulin Infusion Pump Supplies (INSET INFUSION SET 23" 6MM) MISC 1 Device by Does not apply route every 3 (three) days.    . Insulin Infusion Pump Supplies (INSULIN PUMP SYRINGE RESERVOIR) MISC 1 Device by Does not apply route every 3 (three) days. Animas 2 ml    . insulin lispro (HUMALOG) 100 UNIT/ML injection Inject via insulin pump for a total of 70 units per day 70 mL 3  . metoprolol (LOPRESSOR) 100 MG tablet TAKE 1 TABLET (100 MG TOTAL) BY MOUTH 2 (TWO) TIMES DAILY. 60 tablet 11  . omeprazole (PRILOSEC) 40 MG capsule Take 1 capsule by mouth  daily before breakfast 90 capsule 2  . pravastatin (PRAVACHOL) 20 MG tablet TAKE 1 TABLET BY MOUTH IN THE EVENING 90 tablet 1  . Probiotic Product (PROBIOTIC PO) Take 1 tablet by mouth daily at 10 pm.    . traMADol (ULTRAM) 50 MG tablet TAKE 2 TABLETS BY MOUTH EVERY 6 HOURS AS NEEDED FOR PAIN 120 tablet 3   No current facility-administered medications on file prior to visit.     Allergies  Allergen Reactions  . Carvedilol Other (See Comments)    Heart stops  . Augmentin [Amoxicillin-Pot Clavulanate] Diarrhea  . Clindamycin/Lincomycin Other (See Comments)    tremors  . Diltiazem Hcl Other (See Comments)    Chest pain;   07/02/14-  Patient has been able to tolerate diltiazem PO as inpatient since 06/29/14 without any adverse reaction.  . Fenofibrate Other (See Comments)    Unknown - NH - MAR  . Hctz [Hydrochlorothiazide] Other (See Comments)    Chest pain  . Nisoldipine Other (See Comments)    Chest pain  . Nitroglycerin Other (See Comments)    Unknown - NH MAR  . Propranolol Diarrhea    Chest pain  . Septra [Sulfamethoxazole-Trimethoprim]     Rash, cyst  . Emma Yu] Other (See Comments)    Unknown reaction  . Valturna [Aliskiren-Valsartan] Other (See Comments)    Unknown reaction     Assessment/Plan:  1. Hypertension - BP has consistently been at goal <150/94mmHg with office readings. Home  readings likely inaccurate because patient has atrial fibrillation and is also using a wrist cuff which tends to be less accurate. No medication changes today, advised pt that large variations in BP are likely due to her afib. Follow up with Dr  Nishan in 3 months as planned.   Mckell Riecke E. Marialice Newkirk, PharmD, [place] [id number] [place], [place] [address] Phone: [phone]; Fax: [phone] 05/17/2016 9:11 AM

## 2016-05-17 ENCOUNTER — Other Ambulatory Visit: Payer: Medicare Other | Admitting: *Deleted

## 2016-05-17 ENCOUNTER — Ambulatory Visit (INDEPENDENT_AMBULATORY_CARE_PROVIDER_SITE_OTHER): Payer: Medicare Other | Admitting: Pharmacist

## 2016-05-17 VITALS — BP 132/64 | HR 65

## 2016-05-17 DIAGNOSIS — I10 Essential (primary) hypertension: Secondary | ICD-10-CM | POA: Diagnosis not present

## 2016-05-17 DIAGNOSIS — E875 Hyperkalemia: Secondary | ICD-10-CM

## 2016-05-17 LAB — BASIC METABOLIC PANEL
BUN: 53 mg/dL — AB (ref 7–25)
CALCIUM: 10.4 mg/dL (ref 8.6–10.4)
CHLORIDE: 104 mmol/L (ref 98–110)
CO2: 25 mmol/L (ref 20–31)
CREATININE: 1.61 mg/dL — AB (ref 0.60–0.88)
Glucose, Bld: 189 mg/dL — ABNORMAL HIGH (ref 65–99)
Potassium: 4.5 mmol/L (ref 3.5–5.3)
Sodium: 140 mmol/L (ref 135–146)

## 2016-05-22 ENCOUNTER — Encounter: Payer: Self-pay | Admitting: Endocrinology

## 2016-05-22 ENCOUNTER — Ambulatory Visit (INDEPENDENT_AMBULATORY_CARE_PROVIDER_SITE_OTHER): Payer: Medicare Other | Admitting: Endocrinology

## 2016-05-22 VITALS — BP 136/46 | HR 63 | Ht 64.0 in | Wt 149.0 lb

## 2016-05-22 DIAGNOSIS — E1051 Type 1 diabetes mellitus with diabetic peripheral angiopathy without gangrene: Secondary | ICD-10-CM | POA: Diagnosis not present

## 2016-05-22 DIAGNOSIS — Z23 Encounter for immunization: Secondary | ICD-10-CM

## 2016-05-22 LAB — POCT GLYCOSYLATED HEMOGLOBIN (HGB A1C): Hemoglobin A1C: 7.6

## 2016-05-22 NOTE — Progress Notes (Signed)
Subjective:    Patient ID: Emma Yu, female    DOB: 05-01-1927, 82 y.o.   MRN: [id number]  HPI Pt returns for f/u of diabetes mellitus: DM type: 1 Dx'ed: [id number] Complications: CAD, nephropathy, and PAD.   Therapy: insulin since 2001 GDM: never DKA: never Severe hypoglycemia: never Pancreatitis: never Other: she has a one-touch ping insulin pump; she has declined continuous glucose monitor Interval history: She averages a total of approx 60 units of humalog per day, via her pump.  She uses these settings: basal rate of 0.8 unit/hr, 24 hrs per day.   bolus of 10 units with each meal.   correction bolus (which some people call "sensitivity," or "insulin sensitivity ratio," or just "isr") of 1 unit for each by 25 which your glucose exceeds 120.  Since last ov, she has had only 1 episode of hypoglycemia, and this was mild.  no cbg record, but states cbg's are well-controlled.  There is no trend throughout the day.  She averages a total of approx 55 units/day.  Past Medical History:  Diagnosis Date  . Acute respiratory failure ([place])    12/2011 admission - decreased O2 sats on ambulation, improved by time of discharge  . Aortic stenosis    Moderate by echo 4/13 - mean 20 mmHg  . Arthritis    "back" (08/24/2015)  . Atrial fibrillation ([place])    not a coumadin candidate secondary to fall risk  . CAD (coronary artery disease)    minimal plaque by cath 5/12  . CHF (congestive heart failure) (HCC)    EF 55-60%  . Chronic atrial fibrillation with RVR (HCC)   . Chronic diastolic heart failure (HCC)    Echocardiogram 12/10/11: Moderate LVH, EF 65-70%, moderate aortic stenosis, mean gradient 20, mild MS  . Chronic mid back pain   . DJD (degenerative joint disease) of hip    s/p R THR 10/2010  . DVT (deep venous thrombosis) ([place]) ~ 2012   LLE  . History of blood transfusion 1957   "related to childbirth"  . Hyperlipidemia   . Hypertension   . Insulin pump in place   . Iron  deficiency anemia   . Kidney stones ~ 1958   "no OR"  . Migraine    "none since my hysterectomy" (08/24/2015)  . Mitral stenosis    Mild by echo 4/13  . Myocardial infarction 12/2010  . PAD (peripheral artery disease) (HCC)    s/p bilateral comon iliac artery stenting in 2002. Known significant  R SFA  disease. carotid dz noted 11/2011 followed by Dr. Bridgett Larsson  . Skin cancer    right forehead/head  . Type II diabetes mellitus ([place]) dx'd 1999  . Venous insufficiency     Past Surgical History:  Procedure Laterality Date  . ABDOMINAL HYSTERECTOMY    . APPENDECTOMY    . BACK SURGERY    . CARDIAC CATHETERIZATION  01/10/2011   No significant CAD  . CATARACT EXTRACTION, BILATERAL Bilateral 2015  . CHOLECYSTECTOMY OPEN    . ILIAC ARTERY STENT Bilateral 2002   [place]  . JOINT REPLACEMENT    . [place] SURGERY  1999  . REVISION TOTAL HIP ARTHROPLASTY  1994  . SKIN CANCER EXCISION  2016   top of right forehead/head  . TONSILLECTOMY    . TOTAL HIP ARTHROPLASTY Right 1991    Social History   Social History  . Marital status: Widowed    Spouse name: N/A  . Number of  children: N/A  . Years of education: N/A   Occupational History  .  Retired    retired   Social History Main Topics  . Smoking status: Former Smoker    Packs/day: 2.00    Years: 37.00    Types: Cigarettes    Quit date: 08/20/1985  . Smokeless tobacco: Never Used  . Alcohol use No  . Drug use: No  . Sexual activity: No   Other Topics Concern  . Not on file   Social History Narrative   Lives with son   Widowed 54   Quit smoking in 1987    Current Outpatient Prescriptions on File Prior to Visit  Medication Sig Dispense Refill  . amLODipine (NORVASC) 5 MG tablet Take 1 tablet (5 mg total) by mouth daily. 90 tablet 3  . Ascorbic Acid (VITAMIN C) 500 MG tablet Take 500 mg by mouth daily.      Marland Kitchen aspirin EC 81 MG tablet Take 81 mg by mouth 2 (two) times daily.    Marland Kitchen b complex vitamins tablet Take 1 tablet  by mouth daily.      . Cholecalciferol (VITAMIN D3) 2000 UNITS capsule Take 2,000 Units by mouth daily.      . fish oil-omega-3 fatty acids 1000 MG capsule Take 2 g by mouth 2 (two) times daily.     . furosemide (LASIX) 80 MG tablet Take 80 mg by mouth daily.    Marland Kitchen gabapentin (NEURONTIN) 300 MG capsule TAKE 2 CAPSULES BY MOUTH AT BEDTIME 60 capsule 11  . glucose blood test strip 1 each by Other route 4 (four) times daily. Use as instructed    . Insulin Infusion Pump Supplies (INSET INFUSION SET 23" 6MM) MISC 1 Device by Does not apply route every 3 (three) days.    . Insulin Infusion Pump Supplies (INSULIN PUMP SYRINGE RESERVOIR) MISC 1 Device by Does not apply route every 3 (three) days. Animas 2 ml    . insulin lispro (HUMALOG) 100 UNIT/ML injection Inject via insulin pump for a total of 70 units per day 70 mL 3  . metoprolol (LOPRESSOR) 100 MG tablet TAKE 1 TABLET (100 MG TOTAL) BY MOUTH 2 (TWO) TIMES DAILY. 60 tablet 11  . omeprazole (PRILOSEC) 40 MG capsule Take 1 capsule by mouth  daily before breakfast 90 capsule 2  . pravastatin (PRAVACHOL) 20 MG tablet TAKE 1 TABLET BY MOUTH IN THE EVENING 90 tablet 1  . Probiotic Product (PROBIOTIC PO) Take 1 tablet by mouth daily at 10 pm.    . traMADol (ULTRAM) 50 MG tablet TAKE 2 TABLETS BY MOUTH EVERY 6 HOURS AS NEEDED FOR PAIN 120 tablet 3   No current facility-administered medications on file prior to visit.     Allergies  Allergen Reactions  . Carvedilol Other (See Comments)    Heart stops  . Augmentin [Amoxicillin-Pot Clavulanate] Diarrhea  . Clindamycin/Lincomycin Other (See Comments)    tremors  . Diltiazem Hcl Other (See Comments)    Chest pain;   07/02/14-  Patient has been able to tolerate diltiazem PO as inpatient since 06/29/14 without any adverse reaction.  . Fenofibrate Other (See Comments)    Unknown - [facility] - MAR  . Hctz [Hydrochlorothiazide] Other (See Comments)    Chest pain  . Nisoldipine Other (See Comments)    Chest pain    . Nitroglycerin Other (See Comments)    Unknown - NH MAR  . Propranolol Diarrhea    Chest pain  . Septra [Sulfamethoxazole-Trimethoprim]  Rash, cyst  . Patrici Ranks Fumarate] Other (See Comments)    Unknown reaction  . Valturna [Aliskiren-Valsartan] Other (See Comments)    Unknown reaction    Family History  Problem Relation Age of Onset  . Diabetes Father   . Diabetes Other     5/8 sibs    BP (!) 136/46   Pulse 63   Ht 5\' 4"  (1.626 m)   Wt 149 lb (67.6 kg)   SpO2 98%   BMI 25.58 kg/m    Review of Systems Denies LOC    Objective:   Physical Exam VITAL SIGNS:  See vs page GENERAL: no distress Pulses: dorsalis pedis are absent bilaterally.  Feet: no deformity. no ulcer on the feet. feet are of normal temp. trace bilat leg edema. There is mild (chronic) erythema of the legs. There are bilat varicosities, and rusty discoloration on the legs and feet. There is severe bilateral onychomycosis.  Neuro: sensation is intact to touch on the feet.   A1c=7.6%    Assessment & Plan:  Frail elderly state: in this setting, she is not a candidate for an a1c <7% Type 1 DM: well-controlled Hypoglycemia, mild.  As she has had just 1 episode, we'll follow for now.  Patient is advised the following: Patient Instructions  Please continue:  basal rate of 0.8 unit/hr, 24 hrs per day.   bolus of 10 units with each meal.  correction bolus (which some people call "sensitivity," or "insulin sensitivity ratio," or just "isr") of 1 unit for each by 25 which your glucose exceeds 120.  check your blood sugar 4 times a day--before the 3 meals, and at bedtime.  also check if you have symptoms of your blood sugar being too high or too low.  please keep a record of the readings and bring it to your next appointment here.  please call us sooner if you are having low blood sugar episodes.   Please make a follow-up appointment in 4 months.

## 2016-05-22 NOTE — Patient Instructions (Addendum)
Please continue:  basal rate of 0.8 unit/hr, 24 hrs per day.   bolus of 10 units with each meal.  correction bolus (which some people call "sensitivity," or "insulin sensitivity ratio," or just "isr") of 1 unit for each by 25 which your glucose exceeds 120.  check your blood sugar 4 times a day--before the 3 meals, and at bedtime.  also check if you have symptoms of your blood sugar being too high or too low.  please keep a record of the readings and bring it to your next appointment here.  please call us sooner if you are having low blood sugar episodes.   Please make a follow-up appointment in 4 months.

## 2016-05-24 ENCOUNTER — Other Ambulatory Visit: Payer: Self-pay | Admitting: Internal Medicine

## 2016-05-30 ENCOUNTER — Encounter (HOSPITAL_COMMUNITY): Payer: Self-pay

## 2016-05-30 ENCOUNTER — Emergency Department (HOSPITAL_COMMUNITY): Payer: Medicare Other

## 2016-05-30 ENCOUNTER — Inpatient Hospital Stay (HOSPITAL_COMMUNITY)
Admission: EM | Admit: 2016-05-30 | Discharge: 2016-06-05 | DRG: 291 | Disposition: A | Discharge status: Skilled Nursing Facility | Payer: Medicare Other | Attending: Internal Medicine | Admitting: Internal Medicine

## 2016-05-30 DIAGNOSIS — E119 Type 2 diabetes mellitus without complications: Secondary | ICD-10-CM

## 2016-05-30 DIAGNOSIS — I130 Hypertensive heart and chronic kidney disease with heart failure and stage 1 through stage 4 chronic kidney disease, or unspecified chronic kidney disease: Secondary | ICD-10-CM | POA: Diagnosis not present

## 2016-05-30 DIAGNOSIS — I482 Chronic atrial fibrillation, unspecified: Secondary | ICD-10-CM | POA: Diagnosis present

## 2016-05-30 DIAGNOSIS — J189 Pneumonia, unspecified organism: Secondary | ICD-10-CM | POA: Diagnosis present

## 2016-05-30 DIAGNOSIS — E1122 Type 2 diabetes mellitus with diabetic chronic kidney disease: Secondary | ICD-10-CM | POA: Diagnosis not present

## 2016-05-30 DIAGNOSIS — M6281 Muscle weakness (generalized): Secondary | ICD-10-CM | POA: Diagnosis not present

## 2016-05-30 DIAGNOSIS — N183 Chronic kidney disease, stage 3 unspecified: Secondary | ICD-10-CM | POA: Diagnosis present

## 2016-05-30 DIAGNOSIS — J9601 Acute respiratory failure with hypoxia: Secondary | ICD-10-CM | POA: Diagnosis present

## 2016-05-30 DIAGNOSIS — I10 Essential (primary) hypertension: Secondary | ICD-10-CM | POA: Diagnosis present

## 2016-05-30 DIAGNOSIS — Z7982 Long term (current) use of aspirin: Secondary | ICD-10-CM

## 2016-05-30 DIAGNOSIS — R2681 Unsteadiness on feet: Secondary | ICD-10-CM | POA: Diagnosis not present

## 2016-05-30 DIAGNOSIS — R279 Unspecified lack of coordination: Secondary | ICD-10-CM | POA: Diagnosis not present

## 2016-05-30 DIAGNOSIS — I509 Heart failure, unspecified: Secondary | ICD-10-CM | POA: Diagnosis not present

## 2016-05-30 DIAGNOSIS — E1121 Type 2 diabetes mellitus with diabetic nephropathy: Secondary | ICD-10-CM | POA: Diagnosis not present

## 2016-05-30 DIAGNOSIS — Z87891 Personal history of nicotine dependence: Secondary | ICD-10-CM

## 2016-05-30 DIAGNOSIS — Z96641 Presence of right artificial hip joint: Secondary | ICD-10-CM | POA: Diagnosis present

## 2016-05-30 DIAGNOSIS — Z85828 Personal history of other malignant neoplasm of skin: Secondary | ICD-10-CM | POA: Diagnosis not present

## 2016-05-30 DIAGNOSIS — I5023 Acute on chronic systolic (congestive) heart failure: Secondary | ICD-10-CM | POA: Diagnosis not present

## 2016-05-30 DIAGNOSIS — Z833 Family history of diabetes mellitus: Secondary | ICD-10-CM

## 2016-05-30 DIAGNOSIS — I350 Nonrheumatic aortic (valve) stenosis: Secondary | ICD-10-CM | POA: Diagnosis not present

## 2016-05-30 DIAGNOSIS — I739 Peripheral vascular disease, unspecified: Secondary | ICD-10-CM | POA: Diagnosis present

## 2016-05-30 DIAGNOSIS — Z9641 Presence of insulin pump (external) (internal): Secondary | ICD-10-CM | POA: Diagnosis present

## 2016-05-30 DIAGNOSIS — M1611 Unilateral primary osteoarthritis, right hip: Secondary | ICD-10-CM | POA: Diagnosis present

## 2016-05-30 DIAGNOSIS — I5033 Acute on chronic diastolic (congestive) heart failure: Secondary | ICD-10-CM | POA: Diagnosis not present

## 2016-05-30 DIAGNOSIS — I248 Other forms of acute ischemic heart disease: Secondary | ICD-10-CM | POA: Diagnosis not present

## 2016-05-30 DIAGNOSIS — I080 Rheumatic disorders of both mitral and aortic valves: Secondary | ICD-10-CM | POA: Diagnosis not present

## 2016-05-30 DIAGNOSIS — Z79899 Other long term (current) drug therapy: Secondary | ICD-10-CM

## 2016-05-30 DIAGNOSIS — I5031 Acute diastolic (congestive) heart failure: Secondary | ICD-10-CM | POA: Diagnosis not present

## 2016-05-30 DIAGNOSIS — E875 Hyperkalemia: Secondary | ICD-10-CM | POA: Diagnosis not present

## 2016-05-30 DIAGNOSIS — R0600 Dyspnea, unspecified: Secondary | ICD-10-CM

## 2016-05-30 DIAGNOSIS — I2510 Atherosclerotic heart disease of native coronary artery without angina pectoris: Secondary | ICD-10-CM | POA: Diagnosis present

## 2016-05-30 DIAGNOSIS — K219 Gastro-esophageal reflux disease without esophagitis: Secondary | ICD-10-CM | POA: Diagnosis present

## 2016-05-30 DIAGNOSIS — Z87442 Personal history of urinary calculi: Secondary | ICD-10-CM

## 2016-05-30 DIAGNOSIS — J9600 Acute respiratory failure, unspecified whether with hypoxia or hypercapnia: Secondary | ICD-10-CM | POA: Diagnosis present

## 2016-05-30 DIAGNOSIS — Z888 Allergy status to other drugs, medicaments and biological substances status: Secondary | ICD-10-CM

## 2016-05-30 DIAGNOSIS — Z794 Long term (current) use of insulin: Secondary | ICD-10-CM

## 2016-05-30 DIAGNOSIS — R079 Chest pain, unspecified: Secondary | ICD-10-CM | POA: Diagnosis not present

## 2016-05-30 DIAGNOSIS — Z880 Allergy status to penicillin: Secondary | ICD-10-CM

## 2016-05-30 DIAGNOSIS — R32 Unspecified urinary incontinence: Secondary | ICD-10-CM | POA: Diagnosis present

## 2016-05-30 DIAGNOSIS — I252 Old myocardial infarction: Secondary | ICD-10-CM | POA: Diagnosis not present

## 2016-05-30 DIAGNOSIS — Z882 Allergy status to sulfonamides status: Secondary | ICD-10-CM

## 2016-05-30 DIAGNOSIS — R0602 Shortness of breath: Secondary | ICD-10-CM

## 2016-05-30 DIAGNOSIS — M25551 Pain in right hip: Secondary | ICD-10-CM | POA: Diagnosis not present

## 2016-05-30 DIAGNOSIS — E785 Hyperlipidemia, unspecified: Secondary | ICD-10-CM | POA: Diagnosis not present

## 2016-05-30 DIAGNOSIS — N179 Acute kidney failure, unspecified: Secondary | ICD-10-CM | POA: Diagnosis not present

## 2016-05-30 LAB — GLUCOSE, CAPILLARY: GLUCOSE-CAPILLARY: 173 mg/dL — AB (ref 65–99)

## 2016-05-30 LAB — CBC
HEMATOCRIT: 41.5 % (ref 36.0–46.0)
Hemoglobin: 13.2 g/dL (ref 12.0–15.0)
MCH: 32.4 pg (ref 26.0–34.0)
MCHC: 31.8 g/dL (ref 30.0–36.0)
MCV: 101.7 fL — AB (ref 78.0–100.0)
PLATELETS: 220 10*3/uL (ref 150–400)
RBC: 4.08 MIL/uL (ref 3.87–5.11)
RDW: 18.3 % — AB (ref 11.5–15.5)
WBC: 9.7 10*3/uL (ref 4.0–10.5)

## 2016-05-30 LAB — I-STAT CHEM 8, ED
BUN: 53 mg/dL — AB (ref 6–20)
CHLORIDE: 103 mmol/L (ref 101–111)
CREATININE: 1.3 mg/dL — AB (ref 0.44–1.00)
Calcium, Ion: 1.14 mmol/L — ABNORMAL LOW (ref 1.15–1.40)
GLUCOSE: 245 mg/dL — AB (ref 65–99)
HCT: 43 % (ref 36.0–46.0)
HEMOGLOBIN: 14.6 g/dL (ref 12.0–15.0)
POTASSIUM: 5.3 mmol/L — AB (ref 3.5–5.1)
Sodium: 141 mmol/L (ref 135–145)
TCO2: 31 mmol/L (ref 0–100)

## 2016-05-30 LAB — I-STAT TROPONIN, ED
Troponin i, poc: 0 ng/mL (ref 0.00–0.08)
Troponin i, poc: 0 ng/mL (ref 0.00–0.08)

## 2016-05-30 LAB — BASIC METABOLIC PANEL
Anion gap: 11 (ref 5–15)
BUN: 44 mg/dL — AB (ref 6–20)
CHLORIDE: 103 mmol/L (ref 101–111)
CO2: 24 mmol/L (ref 22–32)
CREATININE: 1.31 mg/dL — AB (ref 0.44–1.00)
Calcium: 9.9 mg/dL (ref 8.9–10.3)
GFR calc Af Amer: 41 mL/min — ABNORMAL LOW (ref >60)
GFR calc non Af Amer: 36 mL/min — ABNORMAL LOW (ref >60)
GLUCOSE: 238 mg/dL — AB (ref 65–99)
POTASSIUM: 5.2 mmol/L — AB (ref 3.5–5.1)
SODIUM: 138 mmol/L (ref 135–145)

## 2016-05-30 LAB — BRAIN NATRIURETIC PEPTIDE: B NATRIURETIC PEPTIDE 5: 296.8 pg/mL — AB (ref 0.0–100.0)

## 2016-05-30 MED ORDER — AMLODIPINE BESYLATE 5 MG PO TABS
5.0000 mg | ORAL_TABLET | Freq: Every day | ORAL | Status: DC
  Administered 2016-05-31 – 2016-06-04 (×5): 5 mg via ORAL
  Filled 2016-05-30 (×5): qty 1

## 2016-05-30 MED ORDER — ACETAMINOPHEN 500 MG PO TABS: 1000.0000 mg | ORAL_TABLET | Freq: Four times a day (QID) | ORAL | Status: DC | PRN

## 2016-05-30 MED ORDER — FUROSEMIDE 10 MG/ML IJ SOLN
80.0000 mg | Freq: Once | INTRAMUSCULAR | Status: AC
  Administered 2016-05-30: 80 mg via INTRAVENOUS
  Filled 2016-05-30: qty 8

## 2016-05-30 MED ORDER — ASPIRIN EC 81 MG PO TBEC
81.0000 mg | DELAYED_RELEASE_TABLET | Freq: Two times a day (BID) | ORAL | Status: DC
  Administered 2016-05-31 – 2016-06-05 (×12): 81 mg via ORAL
  Filled 2016-05-30 (×12): qty 1

## 2016-05-30 MED ORDER — PRAVASTATIN SODIUM 20 MG PO TABS
20.0000 mg | ORAL_TABLET | Freq: Every evening | ORAL | Status: DC
  Administered 2016-05-31 – 2016-06-04 (×6): 20 mg via ORAL
  Filled 2016-05-30 (×6): qty 1

## 2016-05-30 MED ORDER — SODIUM CHLORIDE 0.9% FLUSH: 3.0000 mL | INTRAVENOUS | Status: DC | PRN

## 2016-05-30 MED ORDER — ONDANSETRON HCL 4 MG/2ML IJ SOLN: 4.0000 mg | Freq: Four times a day (QID) | INTRAMUSCULAR | Status: DC | PRN

## 2016-05-30 MED ORDER — FUROSEMIDE 10 MG/ML IJ SOLN
40.0000 mg | Freq: Two times a day (BID) | INTRAMUSCULAR | Status: DC
  Administered 2016-05-31: 40 mg via INTRAVENOUS
  Filled 2016-05-30: qty 4

## 2016-05-30 MED ORDER — TRAMADOL HCL 50 MG PO TABS
50.0000 mg | ORAL_TABLET | Freq: Four times a day (QID) | ORAL | Status: DC | PRN
  Administered 2016-05-31 – 2016-06-05 (×4): 50 mg via ORAL
  Filled 2016-05-30 (×4): qty 1

## 2016-05-30 MED ORDER — SODIUM CHLORIDE 0.9 % IV SOLN: 250.0000 mL | INTRAVENOUS | Status: DC | PRN

## 2016-05-30 MED ORDER — ACETAMINOPHEN 325 MG PO TABS: 650.0000 mg | ORAL_TABLET | ORAL | Status: DC | PRN

## 2016-05-30 MED ORDER — ENOXAPARIN SODIUM 30 MG/0.3ML ~~LOC~~ SOLN
30.0000 mg | Freq: Every day | SUBCUTANEOUS | Status: DC
  Administered 2016-05-31 – 2016-06-05 (×6): 30 mg via SUBCUTANEOUS
  Filled 2016-05-30 (×6): qty 0.3

## 2016-05-30 MED ORDER — METOPROLOL TARTRATE 100 MG PO TABS
100.0000 mg | ORAL_TABLET | Freq: Two times a day (BID) | ORAL | Status: DC
  Administered 2016-05-31 – 2016-06-05 (×11): 100 mg via ORAL
  Filled 2016-05-30: qty 4
  Filled 2016-05-30: qty 1
  Filled 2016-05-30: qty 4
  Filled 2016-05-30: qty 1
  Filled 2016-05-30 (×8): qty 4

## 2016-05-30 MED ORDER — RISAQUAD PO CAPS
1.0000 | ORAL_CAPSULE | Freq: Every day | ORAL | Status: DC
  Administered 2016-05-31 – 2016-06-04 (×6): 1 via ORAL
  Filled 2016-05-30 (×6): qty 1

## 2016-05-30 MED ORDER — PANTOPRAZOLE SODIUM 40 MG PO TBEC
40.0000 mg | DELAYED_RELEASE_TABLET | Freq: Every day | ORAL | Status: DC
  Administered 2016-05-31 – 2016-06-05 (×6): 40 mg via ORAL
  Filled 2016-05-30 (×7): qty 1

## 2016-05-30 MED ORDER — SODIUM CHLORIDE 0.9% FLUSH
3.0000 mL | Freq: Two times a day (BID) | INTRAVENOUS | Status: DC
  Administered 2016-05-30 – 2016-06-05 (×12): 3 mL via INTRAVENOUS

## 2016-05-30 NOTE — ED Triage Notes (Signed)
Patient arrived with obvious dyspnea x 1 day. Hx of CHF and complains of trouble breathing and chest pain x 1 day. Alert and oriented but unable to speak but a few words.

## 2016-05-30 NOTE — ED Notes (Addendum)
Son removed patients insulin pump and took home with him

## 2016-05-30 NOTE — ED Provider Notes (Signed)
The patient is a 82 year old female, she reports a history of congestive heart failure. She presents to the hospital today with worsening shortness of breath. [name] unable to give any history because of her severe dyspnea and is diaphoretic on exam. On my exam the patient is tachypneic, hypoxic, tachycardic, she has scant bilateral peripheral edema, no JVD. She has rales and wheezing in all lung fields, she is only able to speak in 1-2 word sentences. Level V caveat apply secondary to respiratory distress.  Chest x-ray shows bilateral pulmonary edema, EKG shows some ectopy, essentially unchanged from baseline EKG but very abnormal. Lab work shows no acute ischemia, she'll need to be admitted to the hospital for congestive heart failure exacerbation. The patient is now on BiPAP and is significantly better. [name] requiring positive airway pressure, she is critically ill from pulmonary edema.   EKG Interpretation  Date/Time:  Wednesday May 30 2016 17:51:45 EDT Ventricular Rate:  79 PR Interval:    QRS Duration: 110 QT Interval:  404 QTC Calculation: 463 R Axis:   152 Text Interpretation:  Suspect arm lead reversal, interpretation assumes no reversal Atrial fibrillation Septal infarct , age undetermined Lateral infarct , age undetermined Marked ST abnormality, possible inferior subendocardial injury Abnormal ECG since last tracing no significant change Confirmed by Sabra Heck  MD, Amika Tassin ([id number]) on 05/30/2016 7:17:58 PM      CRITICAL CARE Performed by: Johnna Acosta Total critical care time: 35 minutes Critical care time was exclusive of separately billable procedures and treating other patients. Critical care was necessary to treat or prevent imminent or life-threatening deterioration. Critical care was time spent personally by me on the following activities: development of treatment plan with patient and/or surrogate as well as nursing, discussions with consultants, evaluation of patient's  response to treatment, examination of patient, obtaining history from patient or surrogate, ordering and performing treatments and interventions, ordering and review of laboratory studies, ordering and review of radiographic studies, pulse oximetry and re-evaluation of patient's condition.  D/w Dr. Irish Lack - recommends patient be admitted to medicine  I saw and evaluated the patient, reviewed the resident's note and I agree with the findings and plan.   Final diagnoses:  SOB (shortness of breath)  Acute on chronic congestive heart failure, unspecified congestive heart failure type (HCC)      Noemi Chapel, MD 06/02/16 [phone]

## 2016-05-30 NOTE — H&P (Addendum)
History and Physical    Emma Yu [id number] DOB: [birth year] DOA: 05/30/2016  Referring MD/NP/PA: Juanda Bond PCP: [name], MD  Patient coming from: Home via EMS  Chief Complaint: Shortness of breath  HPI: Emma Yu is a 82 y.o. female with medical history significant of CHF, HTN, HLD, CAD, A. fib, DM type II; who presents with progressively worsening shortness breath since last night. Patient reports that she was up and down all night unable to get comfortable. She reports being significantly short of breath and fatigued with any activity or movement. Patient notes she did not try anything to relieve symptoms. Associated symptoms include centralized chest pressure, orthopnea, dry cough. and slow increment increase in weight. Normally patient weighs around 145 pounds, but notes that her weight is up to 149 pounds. She has been weighing herself daily and sending in measurements to her PCP office electronically. Patient expresses that she feels that her weight has gone to her belly. Denies any hemoptysis, fever, chills, dysuria, urinary frequency, or loss of consciousness. Patient also reported taking medications as advised. She last took her Lasix 80 mg this morning.  ED Course: In route with EMS the patient was noted to have O2 saturations as low as 70s in the field for which she was placed on a nonrebreather mask at 15 liter. Upon admission into the emergency department patient was switched to a BiPAP mask with improvement of respiratory status. Vitals include a heart rate 60-77, respirations 17-32, and blood pressure up to 180/63. Initial lab work revealed CBC wnl, potassium 5.2, BUN 44, creatinine 1.31, and glucose 238, troponin 0.0, BNP 296.8. Chest x-ray revealed cardiomegaly with central vascular congestion and mild to moderate pulmonary edema  Review of Systems: As per HPI otherwise 10 point review of systems negative.   Past Medical History:  Diagnosis Date  .  Acute respiratory failure ([place])    12/2011 admission - decreased O2 sats on ambulation, improved by time of discharge  . Aortic stenosis    Moderate by echo 4/13 - mean 20 mmHg  . Arthritis    "back" (08/24/2015)  . Atrial fibrillation ([place])    not a coumadin candidate secondary to fall risk  . CAD (coronary artery disease)    minimal plaque by cath 5/12  . CHF (congestive heart failure) ([facility])    EF 55-60%  . Chronic atrial fibrillation with RVR ([facility])   . Chronic diastolic heart failure ([facility])    Echocardiogram 12/10/11: Moderate LVH, EF 65-70%, moderate aortic stenosis, mean gradient 20, mild MS  . Chronic mid back pain   . DJD (degenerative joint disease) of hip    s/p R THR 10/2010  . DVT (deep venous thrombosis) ([place]) ~ 2012   LLE  . History of blood transfusion 1957   "related to childbirth"  . Hyperlipidemia   . Hypertension   . Insulin pump in place   . Iron deficiency anemia   . Kidney stones ~ 1958   "no OR"  . Migraine    "none since my hysterectomy" (08/24/2015)  . Mitral stenosis    Mild by echo 4/13  . Myocardial infarction 12/2010  . PAD (peripheral artery disease) ([facility])    s/p bilateral comon iliac artery stenting in 2002. Known significant  R SFA  disease. carotid dz noted 11/2011 followed by Dr. Bridgett Larsson  . Skin cancer    right forehead/head  . Type II diabetes mellitus ([place]) dx'd 1999  . Venous insufficiency  Past Surgical History:  Procedure Laterality Date  . ABDOMINAL HYSTERECTOMY    . APPENDECTOMY    . BACK SURGERY    . CARDIAC CATHETERIZATION  01/10/2011   No significant CAD  . CATARACT EXTRACTION, BILATERAL Bilateral 2015  . CHOLECYSTECTOMY OPEN    . ILIAC ARTERY STENT Bilateral 2002   [place]  . JOINT REPLACEMENT    . [place] SURGERY  1999  . REVISION TOTAL HIP ARTHROPLASTY  1994  . SKIN CANCER EXCISION  2016   top of right forehead/head  . TONSILLECTOMY    . TOTAL HIP ARTHROPLASTY Right 1991     reports that she quit smoking about 30  years ago. Her smoking use included Cigarettes. She has a 74.00 pack-year smoking history. She has never used smokeless tobacco. She reports that she does not drink alcohol or use drugs.  Allergies  Allergen Reactions  . Carvedilol Other (See Comments)    Heart stops  . Augmentin [Amoxicillin-Pot Clavulanate] Diarrhea  . Clindamycin/Lincomycin Other (See Comments)    tremors  . Diltiazem Hcl Other (See Comments)    Chest pain;   07/02/14-  Patient has been able to tolerate diltiazem PO as inpatient since 06/29/14 without any adverse reaction.  . Fenofibrate Other (See Comments)    Unknown - [facility] - MAR  . Hctz [Hydrochlorothiazide] Other (See Comments)    Chest pain  . Nisoldipine Other (See Comments)    Chest pain  . Nitroglycerin Other (See Comments)    Unknown - [facility] MAR  . Propranolol Diarrhea    Chest pain  . Septra [Sulfamethoxazole-Trimethoprim]     Rash, cyst  . Patrici Ranks Fumarate] Other (See Comments)    Unknown reaction  . Valturna [Aliskiren-Valsartan] Other (See Comments)    Unknown reaction    Family History  Problem Relation Age of Onset  . Diabetes Father   . Diabetes Other     5/8 sibs    Prior to Admission medications   Medication Sig Start Date End Date Taking? Authorizing Provider  Ascorbic Acid (VITAMIN C) 500 MG tablet Take 500 mg by mouth daily.     Yes Historical Provider, MD  aspirin EC 81 MG tablet Take 81 mg by mouth 2 (two) times daily.   Yes Historical Provider, MD  b complex vitamins tablet Take 1 tablet by mouth daily.     Yes Historical Provider, MD  Cholecalciferol (VITAMIN D3) 2000 UNITS capsule Take 2,000 Units by mouth daily.     Yes Historical Provider, MD  fish oil-omega-3 fatty acids 1000 MG capsule Take 2 g by mouth 2 (two) times daily.    Yes Historical Provider, MD  furosemide (LASIX) 80 MG tablet Take 80 mg by mouth daily.   Yes Historical Provider, MD  gabapentin (NEURONTIN) 300 MG capsule TAKE 2 CAPSULES BY MOUTH AT BEDTIME  04/04/16  Yes Janith Lima, MD  Insulin Infusion Pump Supplies (INSET INFUSION SET 23" 6MM) MISC 1 Device by Does not apply route every 3 (three) days.   Yes Historical Provider, MD  Insulin Infusion Pump Supplies (INSULIN PUMP SYRINGE RESERVOIR) MISC 1 Device by Does not apply route every 3 (three) days. [place] 2 ml   Yes Historical Provider, MD  insulin lispro (HUMALOG) 100 UNIT/ML injection Inject via insulin pump for a total of 70 units per day 10/06/15  Yes Janith Lima, MD  metoprolol (LOPRESSOR) 100 MG tablet TAKE 1 TABLET (100 MG TOTAL) BY MOUTH 2 (TWO) TIMES DAILY. 12/29/15  Yes Jolaine Artist, MD  omeprazole (PRILOSEC) 40 MG capsule Take 1 capsule by mouth  daily before breakfast 01/30/16  Yes Janith Lima, MD  pravastatin (PRAVACHOL) 20 MG tablet TAKE 1 TABLET BY MOUTH IN THE EVENING 04/22/16  Yes Janith Lima, MD  Probiotic Product (PROBIOTIC PO) Take 1 tablet by mouth daily at 10 pm.   Yes Historical Provider, MD  traMADol (ULTRAM) 50 MG tablet TAKE 2 TABLETS BY MOUTH EVERY 6 HOURS AS NEEDED FOR PAIN 05/24/16  Yes Janith Lima, MD  amLODipine (NORVASC) 5 MG tablet Take 1 tablet (5 mg total) by mouth daily. 05/04/16 08/02/16  Isaiah Serge, NP    Physical Exam:   Constitutional: Elderly female in moderate distress appears uncomfortable hospital gurney. Vitals:   05/30/16 1945 05/30/16 2000 05/30/16 2015 05/30/16 2030  BP: (!) 172/53 180/63 155/62 (!) 153/50  Pulse: 65 70 66 66  Resp: 20 20 22 17   SpO2: 94% 95% 100% 99%   Eyes: PERRL, lids and conjunctivae normal ENMT: Mucous membranes are moist. Posterior pharynx clear of any exudate or lesions. Dentures present.  Neck: normal, supple, no masses, no thyromegaly. Positive JVD Respiratory: Tachypneic with bilateral coarse crackles appreciated. No significant wheezing. Patient talking in shorten to 3 word sentences. Cardiovascular: Regular rate and rhythm, + SEM.  No / rubs / gallops. No extremity edema. 2+ pedal pulses. No  carotid bruits.  Abdomen: no tenderness, no masses palpated. No hepatosplenomegaly. Bowel sounds positive.  Musculoskeletal: Tenderness to palpation of the left lower extremity.. No joint deformity upper and lower extremities. Good ROM, no contractures. Normal muscle tone.  Skin: Bilateral lower venous stasis dermatitis Neurologic: CN 2-12 grossly intact. Sensation intact, DTR normal. Strength 5/5 in all 4.  Psychiatric: Normal judgment and insight. Alert and oriented x 3. Normal mood.     Labs on Admission: I have personally reviewed following labs and imaging studies  CBC:  Recent Labs Lab 05/30/16 1823 05/30/16 1835  WBC 9.7  --   HGB 13.2 14.6  HCT 41.5 43.0  MCV 101.7*  --   PLT 220  --    Basic Metabolic Panel:  Recent Labs Lab 05/30/16 1823 05/30/16 1835  NA 138 141  K 5.2* 5.3*  CL 103 103  CO2 24  --   GLUCOSE 238* 245*  BUN 44* 53*  CREATININE 1.31* 1.30*  CALCIUM 9.9  --    GFR: Estimated Creatinine Clearance: 29.4 mL/min (by C-G formula based on SCr of 1.3 mg/dL (H)). Liver Function Tests: No results for input(s): AST, ALT, ALKPHOS, BILITOT, PROT, ALBUMIN in the last 168 hours. No results for input(s): LIPASE, AMYLASE in the last 168 hours. No results for input(s): AMMONIA in the last 168 hours. Coagulation Profile: No results for input(s): INR, PROTIME in the last 168 hours. Cardiac Enzymes: No results for input(s): CKTOTAL, CKMB, CKMBINDEX, TROPONINI in the last 168 hours. BNP (last 3 results) No results for input(s): PROBNP in the last 8760 hours. HbA1C: No results for input(s): HGBA1C in the last 72 hours. CBG: No results for input(s): GLUCAP in the last 168 hours. Lipid Profile: No results for input(s): CHOL, HDL, LDLCALC, TRIG, CHOLHDL, LDLDIRECT in the last 72 hours. Thyroid Function Tests: No results for input(s): TSH, T4TOTAL, FREET4, T3FREE, THYROIDAB in the last 72 hours. Anemia Panel: No results for input(s): VITAMINB12, FOLATE,  FERRITIN, TIBC, IRON, RETICCTPCT in the last 72 hours. Urine analysis:    Component Value Date/Time   COLORURINE YELLOW 08/31/2015  [place] 08/31/2015 1613   LABSPEC 1.015 08/31/2015 1613   PHURINE 5.5 08/31/2015 1613   GLUCOSEU NEGATIVE 08/31/2015 1613   HGBUR TRACE-LYSED (A) 08/31/2015 1613   HGBUR small 08/07/2010 1418   BILIRUBINUR NEGATIVE 08/31/2015 1613   BILIRUBINUR negative 05/05/2013 1707   KETONESUR NEGATIVE 08/31/2015 1613   PROTEINUR NEGATIVE 08/24/2015 1848   UROBILINOGEN 0.2 08/31/2015 1613   NITRITE POSITIVE (A) 08/31/2015 1613   LEUKOCYTESUR MODERATE (A) 08/31/2015 1613   Sepsis Labs: No results found for this or any previous visit (from the past 240 hour(s)).   Radiological Exams on Admission: Dg Chest Portable 1 View  Result Date: 05/30/2016 CLINICAL DATA:  Shortness of breath.  Dyspnea x1 day.  Chest pain EXAM: PORTABLE CHEST 1 VIEW COMPARISON:  10/06/2015 FINDINGS: The cardiomediastinal silhouette is slightly enlarged. There is pulmonary central vascular congestion. Diffuse interstitial hazy opacities compatible with pulmonary edema. Small bilateral effusions. Focal opacity at the left lung base could relate to atelectasis are in infiltrates. No pneumothorax. Atherosclerosis of the aorta. IMPRESSION: 1. Cardiomegaly with central vascular congestion and mild to moderate diffuse pulmonary edema. Small bilateral effusions. 2. Focal opacity at the left lung base could relate to focal atelectasis or infiltrate. 3. Atherosclerotic vascular calcifications of the aorta Electronically Signed   By: Donavan Foil M.D.   On: 05/30/2016 18:17    EKG: Independently reviewed. Atrial fibrillation rate 79  Assessment/Plan Acute respiratory failure with hypoxia/Acute exacerbation diastolic CHF:  Acute. Patient with O2 sats seen as low as 70s on admission. Chest x-ray showing bilateral pulmonary edema. BNP elevated at 289.Last echocardiogram showed EF of 60-65% in  08/2015. - Admit to stepdown  - Continuous pulse oximetry with nasal cannula oxygen keep O2 sats greater than 92% - Place Foley catheter Strict ins and outs and daily - BIPAP prn - Hold Lasix po  - Lasix 40 mg IV every 12 hours - Trend cardiac enzymes - Check echocardiogram in a.m. - Consult cardiology in a.m.  Essential hypertension - Continue metoprolol and amlodipine  Diabetes mellitus type 2 on insulin pump: Insulin pump disconnected. Patient's initial blood glucose 238 on admission. - Insulin pump removed - Hypoglycemic protocols - CBGs every before meals and at bedtime with amoderate sliding scale insulin - Levemir 10 units daily at bedtime - Adjust current regimen, as needed/restart insulin pump when able - Continue gabapentin   Chronic kidney disease stage III: Stable. Initial creatinine 1.3 and BUN of 44. - Repeat BMP in a.m.  CAD - Continue aspirin  Hyperlipidemia - continue pravastatin  GERD - Continue pharmacy substitution of Protonix  DVT prophylaxis: Lovenox Code Status: Full Family Communication: No family present at bedside   Disposition Plan:  discharge home once medically stable  Consults called:  none  Admission status: Inpatient estimated 3-4 days   Norval Morton MD [facility] Pager [phone]  If 7PM-7AM, please contact night-coverage [email or web address] Password [facility]  05/30/2016, 9:34 PM

## 2016-05-30 NOTE — ED Notes (Signed)
MD at bedside. 

## 2016-05-30 NOTE — ED Notes (Signed)
Clothing removed and two disposable incontinence pads placed underneath

## 2016-05-30 NOTE — ED Provider Notes (Signed)
[facility] DEPT Provider Note   CSN: [id number] Arrival date & time: 05/30/16  1741     History   Chief Complaint Chief Complaint  Patient presents with  . Shortness of Breath    HPI Emma Yu is a 82 y.o. female.  The history is provided by the patient.  Shortness of Breath  This is a new problem. The average episode lasts 1 day. The problem occurs continuously.The current episode started 12 to 24 hours ago. The problem has been gradually worsening. Associated symptoms include cough and chest pain (tightness). Pertinent negatives include no fever, no headaches, no rhinorrhea, no sore throat, no ear pain, no sputum production, no hemoptysis, no vomiting, no abdominal pain and no rash. Associated medical issues include CAD, heart failure and past MI.    Past Medical History:  Diagnosis Date  . Acute respiratory failure ([place])    12/2011 admission - decreased O2 sats on ambulation, improved by time of discharge  . Aortic stenosis    Moderate by echo 4/13 - mean 20 mmHg  . Arthritis    "back" (08/24/2015)  . Atrial fibrillation ([place])    not a coumadin candidate secondary to fall risk  . CAD (coronary artery disease)    minimal plaque by cath 5/12  . CHF (congestive heart failure) (HCC)    EF 55-60%  . Chronic atrial fibrillation with RVR (HCC)   . Chronic diastolic heart failure (HCC)    Echocardiogram 12/10/11: Moderate LVH, EF 65-70%, moderate aortic stenosis, mean gradient 20, mild MS  . Chronic mid back pain   . DJD (degenerative joint disease) of hip    s/p R THR 10/2010  . DVT (deep venous thrombosis) ([place]) ~ 2012   LLE  . History of blood transfusion 1957   "related to childbirth"  . Hyperlipidemia   . Hypertension   . Insulin pump in place   . Iron deficiency anemia   . Kidney stones ~ 1958   "no OR"  . Migraine    "none since my hysterectomy" (08/24/2015)  . Mitral stenosis    Mild by echo 4/13  . Myocardial infarction 12/2010  . PAD (peripheral  artery disease) (HCC)    s/p bilateral comon iliac artery stenting in 2002. Known significant  R SFA  disease. carotid dz noted 11/2011 followed by Dr. Bridgett Larsson  . Skin cancer    right forehead/head  . Type II diabetes mellitus ([place]) dx'd 1999  . Venous insufficiency     Patient Active Problem List   Diagnosis Date Noted  . Decubitus ulcer of buttock, stage 1 10/06/2015  . Other seasonal allergic rhinitis 08/31/2015  . PAD (peripheral artery disease) ([place]) 07/05/2015  . Routine general medical examination at a health care facility 11/08/2014  . Chronic diastolic heart failure ([place]) 09/13/2014  . Chronic atrial fibrillation ([place]) 06/29/2014  . Chronic renal insufficiency, stage III (moderate) 06/16/2014  . Diabetic peripheral neuropathy ([place]) 12/21/2013  . Back pain, chronic 06/18/2013  . Mitral stenosis   . Aortic stenosis-moderate   . CAD-minimal 2012   . Diabetes mellitus type 1 with peripheral artery disease ([place]) 10/22/2011  . Chronic venous insufficiency 11/04/2009  . Hyperlipidemia with target LDL less than 70 08/04/2009  . Essential hypertension, benign 08/04/2009  . Atherosclerotic peripheral vascular disease with ulceration ([place]) 08/04/2009  . Osteoarthritis 08/04/2009    Past Surgical History:  Procedure Laterality Date  . ABDOMINAL HYSTERECTOMY    . APPENDECTOMY    . BACK SURGERY    .  CARDIAC CATHETERIZATION  01/10/2011   No significant CAD  . CATARACT EXTRACTION, BILATERAL Bilateral 2015  . CHOLECYSTECTOMY OPEN    . ILIAC ARTERY STENT Bilateral 2002   [place]  . JOINT REPLACEMENT    . [place] SURGERY  1999  . REVISION TOTAL HIP ARTHROPLASTY  1994  . SKIN CANCER EXCISION  2016   top of right forehead/head  . TONSILLECTOMY    . TOTAL HIP ARTHROPLASTY Right 1991    OB History    No data available       Home Medications    Prior to Admission medications   Medication Sig Start Date End Date Taking? Authorizing Provider  amLODipine (NORVASC) 5 MG  tablet Take 1 tablet (5 mg total) by mouth daily. 05/04/16 08/02/16  Isaiah Serge, NP  Ascorbic Acid (VITAMIN C) 500 MG tablet Take 500 mg by mouth daily.      Historical Provider, MD  aspirin EC 81 MG tablet Take 81 mg by mouth 2 (two) times daily.    Historical Provider, MD  b complex vitamins tablet Take 1 tablet by mouth daily.      Historical Provider, MD  Cholecalciferol (VITAMIN D3) 2000 UNITS capsule Take 2,000 Units by mouth daily.      Historical Provider, MD  fish oil-omega-3 fatty acids 1000 MG capsule Take 2 g by mouth 2 (two) times daily.     Historical Provider, MD  furosemide (LASIX) 80 MG tablet Take 80 mg by mouth daily.    Historical Provider, MD  gabapentin (NEURONTIN) 300 MG capsule TAKE 2 CAPSULES BY MOUTH AT BEDTIME 04/04/16   Janith Lima, MD  glucose blood test strip 1 each by Other route 4 (four) times daily. Use as instructed    Historical Provider, MD  Insulin Infusion Pump Supplies (INSET INFUSION SET 23" 6MM) MISC 1 Device by Does not apply route every 3 (three) days.    Historical Provider, MD  Insulin Infusion Pump Supplies (INSULIN PUMP SYRINGE RESERVOIR) MISC 1 Device by Does not apply route every 3 (three) days. [place] 2 ml    Historical Provider, MD  insulin lispro (HUMALOG) 100 UNIT/ML injection Inject via insulin pump for a total of 70 units per day 10/06/15   Janith Lima, MD  metoprolol (LOPRESSOR) 100 MG tablet TAKE 1 TABLET (100 MG TOTAL) BY MOUTH 2 (TWO) TIMES DAILY. 12/29/15   Jolaine Artist, MD  omeprazole (PRILOSEC) 40 MG capsule Take 1 capsule by mouth  daily before breakfast 01/30/16   Janith Lima, MD  pravastatin (PRAVACHOL) 20 MG tablet TAKE 1 TABLET BY MOUTH IN THE EVENING 04/22/16   Janith Lima, MD  Probiotic Product (PROBIOTIC PO) Take 1 tablet by mouth daily at 10 pm.    Historical Provider, MD  traMADol (ULTRAM) 50 MG tablet TAKE 2 TABLETS BY MOUTH EVERY 6 HOURS AS NEEDED FOR PAIN 05/24/16   Janith Lima, MD    Family History Family  History  Problem Relation Age of Onset  . Diabetes Father   . Diabetes Other     5/8 sibs    Social History Social History  Substance Use Topics  . Smoking status: Former Smoker    Packs/day: 2.00    Years: 37.00    Types: Cigarettes    Quit date: 08/20/1985  . Smokeless tobacco: Never Used  . Alcohol use No     Allergies   Carvedilol; Augmentin [amoxicillin-pot clavulanate]; Clindamycin/lincomycin; Diltiazem hcl; Fenofibrate; Hctz [hydrochlorothiazide]; Nisoldipine;  Nitroglycerin; Propranolol; Septra [sulfamethoxazole-trimethoprim]; Tekturna [aliskiren fumarate]; and Valturna [aliskiren-valsartan]   Review of Systems Review of Systems  Constitutional: Negative for chills and fever.  HENT: Negative for ear pain, rhinorrhea and sore throat.   Eyes: Negative for pain and visual disturbance.  Respiratory: Positive for cough and shortness of breath. Negative for hemoptysis and sputum production.   Cardiovascular: Positive for chest pain (tightness). Negative for palpitations.  Gastrointestinal: Negative for abdominal pain and vomiting.  Genitourinary: Negative for dysuria and hematuria.  Musculoskeletal: Negative for arthralgias and back pain.  Skin: Negative for color change and rash.  Neurological: Negative for seizures, syncope and headaches.     Physical Exam Updated Vital Signs BP 152/66   Pulse 70   Resp (!) 32   SpO2 (!) 74%   Physical Exam  Constitutional: [name] oriented to person, place, and time. She appears well-developed and well-nourished. No distress.  HENT:  Head: Normocephalic and atraumatic.  Eyes: Conjunctivae and EOM are normal. Pupils are equal, round, and reactive to light.  Neck: Normal range of motion. Neck supple.  Cardiovascular: Normal rate and regular rhythm.   No murmur heard. Pulmonary/Chest: [name] in respiratory distress. She has wheezes. She has rales.  Abdominal: Soft. There is no tenderness.  Musculoskeletal: She exhibits no edema.    Neurological: [name] alert and oriented to person, place, and time.  Skin: Skin is warm and dry.  Psychiatric: She has a normal mood and affect.  Nursing note and vitals reviewed.    ED Treatments / Results  Labs (all labs ordered are listed, but only abnormal results are displayed) Labs Reviewed  BASIC METABOLIC PANEL - Abnormal; Notable for the following:       Result Value   Potassium 5.2 (*)    Glucose, Bld 238 (*)    BUN 44 (*)    Creatinine, Ser 1.31 (*)    GFR calc non Af Amer 36 (*)    GFR calc Af Amer 41 (*)    All other components within normal limits  CBC - Abnormal; Notable for the following:    MCV 101.7 (*)    RDW 18.3 (*)    All other components within normal limits  BRAIN NATRIURETIC PEPTIDE - Abnormal; Notable for the following:    B Natriuretic Peptide 296.8 (*)    All other components within normal limits  BASIC METABOLIC PANEL - Abnormal; Notable for the following:    Glucose, Bld 204 (*)    BUN 37 (*)    Creatinine, Ser 1.23 (*)    GFR calc non Af Amer 39 (*)    GFR calc Af Amer 45 (*)    All other components within normal limits  CBC WITH DIFFERENTIAL/PLATELET - Abnormal; Notable for the following:    MCV 102.0 (*)    RDW 18.4 (*)    Neutro Abs 8.3 (*)    All other components within normal limits  TROPONIN I - Abnormal; Notable for the following:    Troponin I 0.05 (*)    All other components within normal limits  GLUCOSE, CAPILLARY - Abnormal; Notable for the following:    Glucose-Capillary 173 (*)    All other components within normal limits  GLUCOSE, CAPILLARY - Abnormal; Notable for the following:    Glucose-Capillary 218 (*)    All other components within normal limits  GLUCOSE, CAPILLARY - Abnormal; Notable for the following:    Glucose-Capillary 280 (*)    All other components within normal limits  I-STAT CHEM 8, ED - Abnormal; Notable for the following:    Potassium 5.3 (*)    BUN 53 (*)    Creatinine, Ser 1.30 (*)    Glucose, Bld  245 (*)    Calcium, Ion 1.14 (*)    All other components within normal limits  MRSA PCR SCREENING  TROPONIN I  Randolm Idol, ED  Randolm Idol, ED    Radiology Dg Chest Portable 1 View  Result Date: 05/30/2016 CLINICAL DATA:  Shortness of breath.  Dyspnea x1 day.  Chest pain EXAM: PORTABLE CHEST 1 VIEW COMPARISON:  10/06/2015 FINDINGS: The cardiomediastinal silhouette is slightly enlarged. There is pulmonary central vascular congestion. Diffuse interstitial hazy opacities compatible with pulmonary edema. Small bilateral effusions. Focal opacity at the left lung base could relate to atelectasis are in infiltrates. No pneumothorax. Atherosclerosis of the aorta. IMPRESSION: 1. Cardiomegaly with central vascular congestion and mild to moderate diffuse pulmonary edema. Small bilateral effusions. 2. Focal opacity at the left lung base could relate to focal atelectasis or infiltrate. 3. Atherosclerotic vascular calcifications of the aorta Electronically Signed   By: Donavan Foil M.D.   On: 05/30/2016 18:17    Procedures Procedures (including critical care time)  Medications Ordered in ED Medications  amLODipine (NORVASC) tablet 5 mg (5 mg Oral Given 05/31/16 0952)  acetaminophen (TYLENOL) tablet 1,000 mg (not administered)  traMADol (ULTRAM) tablet 50 mg (50 mg Oral Given 05/31/16 0459)  pravastatin (PRAVACHOL) tablet 20 mg (20 mg Oral Given 05/31/16 0014)  pantoprazole (PROTONIX) EC tablet 40 mg (40 mg Oral Given 05/31/16 0952)  metoprolol tartrate (LOPRESSOR) tablet 100 mg (100 mg Oral Given 05/31/16 0952)  aspirin EC tablet 81 mg (81 mg Oral Given 05/31/16 0952)  acidophilus (RISAQUAD) capsule 1 capsule (1 capsule Oral Given 05/31/16 0014)  sodium chloride flush (NS) 0.9 % injection 3 mL (3 mLs Intravenous Given 05/31/16 0953)  sodium chloride flush (NS) 0.9 % injection 3 mL (not administered)  0.9 %  sodium chloride infusion (not administered)  acetaminophen (TYLENOL) tablet 650  mg (not administered)  ondansetron (ZOFRAN) injection 4 mg (not administered)  enoxaparin (LOVENOX) injection 30 mg (30 mg Subcutaneous Given 05/31/16 0952)  furosemide (LASIX) injection 40 mg (40 mg Intravenous Given 05/31/16 0628)  insulin detemir (LEVEMIR) injection 10 Units (10 Units Subcutaneous Given 05/31/16 0110)  insulin aspart (novoLOG) injection 0-15 Units (5 Units Subcutaneous Given 05/31/16 0952)  insulin aspart (novoLOG) injection 0-5 Units (0 Units Subcutaneous Not Given 05/31/16 0015)  furosemide (LASIX) injection 80 mg (80 mg Intravenous Given 05/30/16 2220)     Initial Impression / Assessment and Plan / ED Course  I have reviewed the triage vital signs and the nursing notes.  Pertinent labs & imaging results that were available during my care of the patient were reviewed by me and considered in my medical decision making (see chart for details).  Clinical Course   Ms. Stoutamire is an 82 year old female with PMH significant for CHF, DVT, CAD, and [id number] who presents for acute respiratory failure.  She has had 1 days of dyspnea and is unable to speak in sentences.  Initially hypoxic to 70's.  Placed on non-rebreather at 15L with improvement.  CXR obtained, personally reviewed by me, demonstrates moderate pulmonary edema with effusions.  BiPAP started.  Patient has a listed allergy to nitroglycerin, so it was not given at this time.  She responded well to the BiPAP and is breathing comfortably.  EKG has apparent lead reversal.  Shows  no significant changes since prior.  Labs obtained, including CBC, BMP, troponin, chem 8 and BNP. Results significant for elevated BNP.  Other results at patient baseline.  Patient given lasix.  Patient is admitted to hospitalist for further CHF treatment.  Final Clinical Impressions(s) / ED Diagnoses   Final diagnoses:  SOB (shortness of breath)  Acute on chronic congestive heart failure, unspecified congestive heart failure type [facility])      New Prescriptions New Prescriptions   No medications on file     Elveria Rising, MD 05/31/16 1249    Noemi Chapel, MD 06/02/16 [phone]

## 2016-05-31 ENCOUNTER — Inpatient Hospital Stay (HOSPITAL_COMMUNITY): Payer: Medicare Other

## 2016-05-31 ENCOUNTER — Encounter (HOSPITAL_COMMUNITY): Payer: Self-pay | Admitting: Physician Assistant

## 2016-05-31 DIAGNOSIS — E119 Type 2 diabetes mellitus without complications: Secondary | ICD-10-CM

## 2016-05-31 DIAGNOSIS — I350 Nonrheumatic aortic (valve) stenosis: Secondary | ICD-10-CM

## 2016-05-31 DIAGNOSIS — N183 Chronic kidney disease, stage 3 (moderate): Secondary | ICD-10-CM

## 2016-05-31 DIAGNOSIS — I482 Chronic atrial fibrillation: Secondary | ICD-10-CM

## 2016-05-31 DIAGNOSIS — I10 Essential (primary) hypertension: Secondary | ICD-10-CM

## 2016-05-31 DIAGNOSIS — I509 Heart failure, unspecified: Secondary | ICD-10-CM

## 2016-05-31 DIAGNOSIS — R0602 Shortness of breath: Secondary | ICD-10-CM

## 2016-05-31 LAB — BASIC METABOLIC PANEL
ANION GAP: 9 (ref 5–15)
BUN: 37 mg/dL — ABNORMAL HIGH (ref 6–20)
CALCIUM: 9.8 mg/dL (ref 8.9–10.3)
CO2: 30 mmol/L (ref 22–32)
Chloride: 101 mmol/L (ref 101–111)
Creatinine, Ser: 1.23 mg/dL — ABNORMAL HIGH (ref 0.44–1.00)
GFR, EST AFRICAN AMERICAN: 45 mL/min — AB (ref >60)
GFR, EST NON AFRICAN AMERICAN: 39 mL/min — AB (ref >60)
GLUCOSE: 204 mg/dL — AB (ref 65–99)
Potassium: 4 mmol/L (ref 3.5–5.1)
Sodium: 140 mmol/L (ref 135–145)

## 2016-05-31 LAB — GLUCOSE, CAPILLARY
GLUCOSE-CAPILLARY: 164 mg/dL — AB (ref 65–99)
Glucose-Capillary: 218 mg/dL — ABNORMAL HIGH (ref 65–99)
Glucose-Capillary: 280 mg/dL — ABNORMAL HIGH (ref 65–99)
Glucose-Capillary: 165 mg/dL — ABNORMAL HIGH (ref 65–99)

## 2016-05-31 LAB — CBC WITH DIFFERENTIAL/PLATELET
BASOS ABS: 0 10*3/uL (ref 0.0–0.1)
BASOS PCT: 0 %
EOS ABS: 0.1 10*3/uL (ref 0.0–0.7)
Eosinophils Relative: 1 %
HEMATOCRIT: 41.3 % (ref 36.0–46.0)
Hemoglobin: 13.1 g/dL (ref 12.0–15.0)
Lymphocytes Relative: 9 %
Lymphs Abs: 0.9 10*3/uL (ref 0.7–4.0)
MCH: 32.3 pg (ref 26.0–34.0)
MCHC: 31.7 g/dL (ref 30.0–36.0)
MCV: 102 fL — ABNORMAL HIGH (ref 78.0–100.0)
MONO ABS: 0.8 10*3/uL (ref 0.1–1.0)
Monocytes Relative: 8 %
NEUTROS ABS: 8.3 10*3/uL — AB (ref 1.7–7.7)
Neutrophils Relative %: 82 %
PLATELETS: 193 10*3/uL (ref 150–400)
RBC: 4.05 MIL/uL (ref 3.87–5.11)
RDW: 18.4 % — AB (ref 11.5–15.5)
WBC: 10.1 10*3/uL (ref 4.0–10.5)

## 2016-05-31 LAB — TROPONIN I
Troponin I: 0.05 ng/mL (ref <0.03)
Troponin I: <0.03 ng/mL (ref <0.03)

## 2016-05-31 LAB — ECHOCARDIOGRAM COMPLETE
HEIGHTINCHES: 64 in
WEIGHTICAEL: 2402.13 [oz_av]

## 2016-05-31 LAB — MRSA PCR SCREENING: MRSA BY PCR: NEGATIVE

## 2016-05-31 MED ORDER — IOPAMIDOL (ISOVUE-370) INJECTION 76%
INTRAVENOUS | Status: AC
  Administered 2016-05-31: 80 mL
  Filled 2016-05-31: qty 100

## 2016-05-31 MED ORDER — INSULIN ASPART 100 UNIT/ML ~~LOC~~ SOLN
0.0000 [IU] | Freq: Three times a day (TID) | SUBCUTANEOUS | Status: DC
  Administered 2016-05-31: 5 [IU] via SUBCUTANEOUS
  Administered 2016-05-31: 3 [IU] via SUBCUTANEOUS
  Administered 2016-05-31: 8 [IU] via SUBCUTANEOUS
  Administered 2016-06-01: 3 [IU] via SUBCUTANEOUS
  Administered 2016-06-01: 11 [IU] via SUBCUTANEOUS
  Administered 2016-06-01 – 2016-06-02 (×2): 3 [IU] via SUBCUTANEOUS
  Administered 2016-06-02: 8 [IU] via SUBCUTANEOUS
  Administered 2016-06-02 – 2016-06-03 (×2): 5 [IU] via SUBCUTANEOUS
  Administered 2016-06-03: 3 [IU] via SUBCUTANEOUS
  Administered 2016-06-03: 8 [IU] via SUBCUTANEOUS
  Administered 2016-06-04: 5 [IU] via SUBCUTANEOUS
  Administered 2016-06-04 (×2): 3 [IU] via SUBCUTANEOUS
  Administered 2016-06-05: 11 [IU] via SUBCUTANEOUS
  Administered 2016-06-05: 5 [IU] via SUBCUTANEOUS

## 2016-05-31 MED ORDER — FUROSEMIDE 10 MG/ML IJ SOLN
80.0000 mg | Freq: Two times a day (BID) | INTRAMUSCULAR | Status: DC
  Administered 2016-05-31 – 2016-06-03 (×6): 80 mg via INTRAVENOUS
  Filled 2016-05-31 (×6): qty 8

## 2016-05-31 MED ORDER — INSULIN ASPART 100 UNIT/ML ~~LOC~~ SOLN
0.0000 [IU] | Freq: Every day | SUBCUTANEOUS | Status: DC
  Administered 2016-06-02: 2 [IU] via SUBCUTANEOUS

## 2016-05-31 MED ORDER — INSULIN DETEMIR 100 UNIT/ML ~~LOC~~ SOLN
10.0000 [IU] | Freq: Every day | SUBCUTANEOUS | Status: DC
  Administered 2016-05-31 – 2016-06-04 (×6): 10 [IU] via SUBCUTANEOUS
  Filled 2016-05-31 (×7): qty 0.1

## 2016-05-31 NOTE — Consult Note (Signed)
CARDIOLOGY CONSULT NOTE   Patient ID: Emma Yu MRN: [id number] DOB/AGE: 10-14-1926 82 y.o.  Admit date: 05/30/2016  Primary Physician   [name], MD Primary Cardiologist   Dr Johnsie Cancel, Dr Fletcher [name] Reason for Consultation   CHF Requesting MD: Dr Doyle Askew  [id number] Emma Yu is a 82 y.o. year old female with a history of chronic afib, not anticoagulated 2nd falls, D-CHF, PAD w/ bilat CIA stents 2002 & R-SFA dz, DM, HTN, HLD, cath 2012 w/ no sig dz. EF nl w/ mild AI & AoV ca++ 08/2015 echo.   08/24 seen in ofc for SOB/CP>>Lasix 60 mg bid x 2 days>>60 mg qd, wt 142 lbs 09/07 seen in ofc for SOB/CP>>Lasix increased 80 mg bid x 3 days>> 80 mg qd, wt 150 lbs 09/13 seen in ofc for SOB and chest pressure, SOB felt improved, continue Lasix 80 mg qd, hold Diovan and d/c Kdur for hyperkalemia w/ K+5.6, wt 148 lbs  Pt admitted 10/11 for SOB, NRB>>BiPAP used for O2 sats 70s, K+ 5.2, CXR abnl. Cards asked to see. Wt 149 lbs.  Pt says increasing SOB was gradual. When the SOB gets severe, that is when she gets chest pain. She has not noticed LE edema, but has noticed increased abdominal girth, increasing DOE, weakness and fatigue with exertion, +orthopnea and PND.   Sx improved since admission, but not back to baseline. Currently on NRB w/ O2 sats 98% or more. No chest pain.   Lives with her son in [place]. Eats out most nights, he works. [employer] and other local restaurants. She eats cheese sandwches, etc, for lunch. She does not add salt in foods, but is not aware of how much intrinsic salt can be in foods she eats. She thinks she drinks about 2 L per day, but does not track that very carefully. No palpitations.    Past Medical History:  Diagnosis Date  . Acute respiratory failure ([place])    12/2011 admission - decreased O2 sats on ambulation, improved by time of discharge  . Aortic stenosis    Moderate by echo 4/13 - mean 20 mmHg  . Arthritis    "back" (08/24/2015)  . Atrial  fibrillation ([place])    not a coumadin candidate secondary to fall risk  . CAD (coronary artery disease)    minimal plaque by cath 5/12  . CHF (congestive heart failure) (HCC)    EF 55-60%  . Chronic atrial fibrillation with RVR (HCC)   . Chronic diastolic heart failure (HCC)    Echocardiogram 12/10/11: Moderate LVH, EF 65-70%, moderate aortic stenosis, mean gradient 20, mild MS  . Chronic mid back pain   . DJD (degenerative joint disease) of hip    s/p R THR 10/2010  . DVT (deep venous thrombosis) ([place]) ~ 2012   LLE  . History of blood transfusion 1957   "related to childbirth"  . Hyperlipidemia   . Hypertension   . Insulin pump in place   . Iron deficiency anemia   . Kidney stones ~ 1958   "no OR"  . Migraine    "none since my hysterectomy" (08/24/2015)  . Mitral stenosis    Mild by echo 4/13  . Myocardial infarction 12/2010  . PAD (peripheral artery disease) (HCC)    s/p bilateral comon iliac artery stenting in 2002. Known significant  R SFA  disease. carotid dz noted 11/2011 followed by Dr. Bridgett Larsson  . Skin cancer    right forehead/head  . Type II  diabetes mellitus ([place]) dx'd 1999  . Venous insufficiency      Past Surgical History:  Procedure Laterality Date  . ABDOMINAL HYSTERECTOMY    . APPENDECTOMY    . BACK SURGERY    . CARDIAC CATHETERIZATION  01/10/2011   No significant CAD  . CATARACT EXTRACTION, BILATERAL Bilateral 2015  . CHOLECYSTECTOMY OPEN    . ILIAC ARTERY STENT Bilateral 2002   [place]  . JOINT REPLACEMENT    . [place] SURGERY  1999  . REVISION TOTAL HIP ARTHROPLASTY  1994  . SKIN CANCER EXCISION  2016   top of right forehead/head  . TONSILLECTOMY    . TOTAL HIP ARTHROPLASTY Right 1991    Allergies  Allergen Reactions  . Carvedilol Other (See Comments)    Heart stops  . Augmentin [Amoxicillin-Pot Clavulanate] Diarrhea  . Clindamycin/Lincomycin Other (See Comments)    tremors  . Diltiazem Hcl Other (See Comments)    Chest pain;   07/02/14-   Patient has been able to tolerate diltiazem PO as inpatient since 06/29/14 without any adverse reaction.  . Fenofibrate Other (See Comments)    Unknown - NH - MAR  . Hctz [Hydrochlorothiazide] Other (See Comments)    Chest pain  . Nisoldipine Other (See Comments)    Chest pain  . Nitroglycerin Other (See Comments)    Unknown - NH MAR  . Propranolol Diarrhea    Chest pain  . Septra [Sulfamethoxazole-Trimethoprim]     Rash, cyst  . Patrici Ranks Fumarate] Other (See Comments)    Unknown reaction  . Valturna [Aliskiren-Valsartan] Other (See Comments)    Unknown reaction    I have reviewed the patient's current medications . acidophilus  1 capsule Oral Q2200  . amLODipine  5 mg Oral Daily  . aspirin EC  81 mg Oral BID  . enoxaparin (LOVENOX) injection  30 mg Subcutaneous Daily  . furosemide  40 mg Intravenous Q12H  . insulin aspart  0-15 Units Subcutaneous TID WC  . insulin aspart  0-5 Units Subcutaneous QHS  . insulin detemir  10 Units Subcutaneous QHS  . metoprolol  100 mg Oral BID  . pantoprazole  40 mg Oral Daily  . pravastatin  20 mg Oral QPM  . sodium chloride flush  3 mL Intravenous Q12H     sodium chloride, acetaminophen, acetaminophen, ondansetron (ZOFRAN) IV, sodium chloride flush, traMADol  Prior to Admission medications   Medication Sig Start Date End Date Taking? Authorizing Provider  acetaminophen (TYLENOL) 500 MG tablet Take 1,000 mg by mouth every 6 (six) hours as needed for moderate pain.   Yes Historical Provider, MD  amLODipine (NORVASC) 5 MG tablet Take 1 tablet (5 mg total) by mouth daily. 05/04/16 08/02/16 Yes Isaiah Serge, NP  Ascorbic Acid (VITAMIN C) 500 MG tablet Take 500 mg by mouth daily.     Yes Historical Provider, MD  aspirin EC 81 MG tablet Take 81 mg by mouth 2 (two) times daily.   Yes Historical Provider, MD  b complex vitamins tablet Take 1 tablet by mouth daily.     Yes Historical Provider, MD  Cholecalciferol (VITAMIN D3) 2000 UNITS  capsule Take 2,000 Units by mouth daily.     Yes Historical Provider, MD  fish oil-omega-3 fatty acids 1000 MG capsule Take 2 g by mouth 2 (two) times daily.    Yes Historical Provider, MD  furosemide (LASIX) 80 MG tablet Take 80 mg by mouth daily.   Yes Historical Provider, MD  gabapentin (NEURONTIN) 300 MG capsule TAKE 2 CAPSULES BY MOUTH AT BEDTIME 04/04/16  Yes Janith Lima, MD  Insulin Infusion Pump Supplies (INSET INFUSION SET 23" 6MM) MISC 1 Device by Does not apply route every 3 (three) days.   Yes Historical Provider, MD  Insulin Infusion Pump Supplies (INSULIN PUMP SYRINGE RESERVOIR) MISC 1 Device by Does not apply route every 3 (three) days. [place] 2 ml   Yes Historical Provider, MD  insulin lispro (HUMALOG) 100 UNIT/ML injection Inject via insulin pump for a total of 70 units per day 10/06/15  Yes Janith Lima, MD  metoprolol (LOPRESSOR) 100 MG tablet TAKE 1 TABLET (100 MG TOTAL) BY MOUTH 2 (TWO) TIMES DAILY. 12/29/15  Yes Jolaine Artist, MD  omeprazole (PRILOSEC) 40 MG capsule Take 1 capsule by mouth  daily before breakfast 01/30/16  Yes Janith Lima, MD  pravastatin (PRAVACHOL) 20 MG tablet TAKE 1 TABLET BY MOUTH IN THE EVENING 04/22/16  Yes Janith Lima, MD  Probiotic Product (PROBIOTIC PO) Take 1 tablet by mouth daily at 10 pm.   Yes Historical Provider, MD  traMADol (ULTRAM) 50 MG tablet TAKE 2 TABLETS BY MOUTH EVERY 6 HOURS AS NEEDED FOR PAIN 05/24/16  Yes Janith Lima, MD     Social History   Social History  . Marital status: Widowed    Spouse name: N/A  . Number of children: N/A  . Years of education: N/A   Occupational History  .  Retired    retired   Social History Main Topics  . Smoking status: Former Smoker    Packs/day: 2.00    Years: 37.00    Types: Cigarettes    Quit date: 08/20/1985  . Smokeless tobacco: Never Used  . Alcohol use No  . Drug use: No  . Sexual activity: No   Other Topics Concern  . Not on file   Social History Narrative   Lives  with son   Widowed 43   Quit smoking in 1987    Family Status  Relation Status  . Maternal Grandmother Deceased  . Maternal Grandfather Deceased  . Paternal Grandmother Deceased  . Paternal Grandfather Deceased  . Father Deceased  . Other   . Mother Deceased   Family History  Problem Relation Age of Onset  . Diabetes Father   . Diabetes Other     5/8 sibs     ROS:  Full 14 point review of systems complete and found to be negative unless listed above.  Physical Exam: Blood pressure (!) 139/42, pulse 62, temperature 99.6 F (37.6 C), temperature source Oral, resp. rate (!) 25, height 5\' 4"  (1.626 m), weight 150 lb 2.1 oz (68.1 kg), SpO2 95 %.  General: Well developed, well nourished, female in no acute distress Head: Eyes PERRLA, No xanthomas.   Normocephalic and atraumatic, oropharynx without edema or exudate. Dentition: poor Lungs: bibasilar rales Heart: Irreg R&R, S1 S2, no rub/gallop, soft murmur. pulses are 2+ all 4 extrem.   Neck: No carotid bruits. No lymphadenopathy.  JVD elevated 11 cm, +HJR Abdomen: Bowel sounds present, abdomen soft and non-tender without masses or hernias noted. Msk:  No spine or cva tenderness. No weakness, no joint deformities or effusions. Extremities: No clubbing or cyanosis. No edema.  Neuro: Alert and oriented X 3. No focal deficits noted. Psych:  Good affect, responds appropriately Skin: No rashes or lesions noted.  Labs:   Lab Results  Component Value Date   WBC 10.1 05/31/2016  HGB 13.1 05/31/2016   HCT 41.3 05/31/2016   MCV 102.0 (H) 05/31/2016   PLT 193 05/31/2016     Recent Labs Lab 05/31/16 0549  NA 140  K 4.0  CL 101  CO2 30  BUN 37*  CREATININE 1.23*  CALCIUM 9.8  GLUCOSE 204*    Recent Labs  05/30/16 2348 05/31/16 0549  TROPONINI <0.03 0.05*    Recent Labs  05/30/16 1833 05/30/16 2150  TROPIPOC 0.00 0.00   B Natriuretic Peptide  Date/Time Value Ref Range Status  05/30/2016 06:31 PM 296.8 (H) 0.0  - 100.0 pg/mL Final  09/13/2014 04:01 PM 269.2 (H) 0.0 - 100.0 pg/mL Final   Lab Results  Component Value Date   CHOL 117 08/31/2015   HDL 48.80 08/31/2015   LDLCALC 42 08/31/2015   TRIG 133.0 08/31/2015   TSH  Date/Time Value Ref Range Status  08/31/2015 04:13 PM 1.90 0.35 - 4.50 uIU/mL Final    Echo: 08/2015 - Left ventricle: The cavity size was normal. Wall thickness was increased in a pattern of moderate LVH. Systolic function was vigorous. The estimated ejection fraction was in the range of 65% to 70%. Wall motion was normal; there were no regional wall motion abnormalities. - Aortic valve: There was mild regurgitation. - Mitral valve: Calcified annulus. There was mild regurgitation. - Left atrium: The atrium was severely dilated. - Right atrium: The atrium was mildly dilated. - Pericardium, extracardiac: A trivial pericardial effusion was identified. Impressions: - Vigorous LV function; moderate LVH; biatrial enlargement; calcified aortic valve with no AS by mean gradient; mild AI; MAC with mild MS.  ECG:  10/11 Atrial fib, controlled rate, conduction defect unchanged from 08/2015  Cath: 12/2010 STUDY CONCLUSIONS: 1. Mild luminal irregularities with no significant coronary artery disease. 2. Moderate systemic hypertension with mildly elevated left     ventricular end-diastolic pressure.  Radiology:  Dg Chest Portable 1 View Result Date: 05/30/2016 CLINICAL DATA:  Shortness of breath.  Dyspnea x1 day.  Chest pain EXAM: PORTABLE CHEST 1 VIEW COMPARISON:  10/06/2015 FINDINGS: The cardiomediastinal silhouette is slightly enlarged. There is pulmonary central vascular congestion. Diffuse interstitial hazy opacities compatible with pulmonary edema. Small bilateral effusions. Focal opacity at the left lung base could relate to atelectasis are in infiltrates. No pneumothorax. Atherosclerosis of the aorta. IMPRESSION: 1. Cardiomegaly with central vascular  congestion and mild to moderate diffuse pulmonary edema. Small bilateral effusions. 2. Focal opacity at the left lung base could relate to focal atelectasis or infiltrate. 3. Atherosclerotic vascular calcifications of the aorta Electronically Signed   By: Donavan Foil M.D.   On: 05/30/2016 18:17    ASSESSMENT AND PLAN:   The patient was seen today by Dr Radford Pax, the patient evaluated and the data reviewed.  Principal Problem: 1.  Acute respiratory failure (HCC) - scarring seen on CXR 08/2015 with no acute dz - now w/ edema +/- infiltrate - mgt per IM, would try to get her off NRB when possible  2. Acute on Chronic Diast CHF, class 4 - dry weight unclear, may be in the low 140s - BUN/Cr are lower than they were in September. - do not think 40 mg IV BID is enough, discuss increasing to 80 mg bid w/ MD - continue to track wt, I/O and daily BMET - K+ was elevated on admission at 5.3, not on supplement, not on ACE/ARB and hemolysis not mentioned. - will have to track this carefully  3.   Chronic atrial fibrillation (HCC) - rate  is ok, continue BB - CHA2DS2VASc=6 (female, age x 2, DVT x 2, HTN, CHF) - no anticoag 2nd fall risk - currently on DVT Lovenox  Otherwise, per IM Active Problems:   Hyperlipidemia with target LDL less than 70   Essential hypertension, benign   Chronic renal insufficiency, stage III (moderate)   CHF exacerbation ([place])   Diabetes mellitus type 2 in nonobese [facility])   SignedRosaria Ferries, PA-C 05/31/2016 2:39 PM Beeper [id number]  Co-Sign MD

## 2016-05-31 NOTE — Progress Notes (Signed)
Patient ID: Emma Yu, female   DOB: 05/31/1927, 82 y.o.   MRN: [id number]    PROGRESS NOTE    Emma Yu  [id number] DOB: December 15, 1926 DOA: 05/30/2016  PCP: [name], MD  Cardiologist: Dr. Johnsie Cancel   Brief Narrative:  82 y.o. female with medical history significant of CHF, HTN, HLD, CAD, A. fib, DM type II; who presented to [facility] ED with main concern of 1-2 days duration of progressively worsening dyspnea initially present with exertion and has progressed to dyspnea at rest in the past 24 hours prior to this admission. This has been associated with non productive cough, fatigue and several pounds weight gain despite poor oral intake.  Assessment & Plan:   Principal Problem:   Acute respiratory failure with hypoxia, mild troponin elevation  - suspect this is due to acute diastolic CHF exacerbation but underlying PNA can not be entirely ruled out - absence of fever and leukocytosis argues against PNA but will certainly have to keep close eye on clinical picture - pt is currently on lasix 40 mg IV BID and weight trend since admission: 149 --> 150 lbs (pt says at baseline she is ~ 145-146 lbs) - we are still unable to taper her off NRB as she quickly desaturates down to 70% - ECHO was already done, last one done in January 2017 with EF 70% - I have also consulted cardiology for assistance  - pt must stay in SDU for now   Active Problems:   Essential hypertension, benign - continue Norvasc and Metoprolol    Chronic renal insufficiency, stage III (moderate) - review of records indicate Cr at baseline ~ 1.4 - 1.7 - it appears that Cr is at pt's baseline - will however, continue to monitor this closely as pt on IV Lasix  - BMP in AM    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   Objective: Vitals:   05/31/16 0400 05/31/16 0500 05/31/16 0501 05/31/16 0843  BP: (!) 153/53  (!) 169/84 137/69  Pulse: 85  80 89  Resp: (!) 31  (!) 22 (!) 27  Temp:    97.8 F (36.6 C)  TempSrc:    Oral  SpO2: 96%  94% 93%  Weight:  68.1 kg (150 lb 2.1 oz)    Height:        Intake/Output Summary (Last 24 hours) at 05/31/16 1219 Last data filed at 05/31/16 0900  Gross per 24 hour  Intake              240 ml  Output                0 ml  Net              240 ml   Filed Weights   05/30/16 2345 05/31/16 0500  Weight: 67.8 kg (149 lb 7.6 oz) 68.1 kg (150 lb 2.1 oz)    Examination:  General exam: Appears calm,  on NRB Respiratory system: slight tachypnea and diminished breath sounds at bases with bilateral rhonchi  Cardiovascular system: S1 & S2 heard, RRR. No murmurs, rubs, gallops or clicks. . Gastrointestinal system: Abdomen is nondistended, soft and nontender. No organomegaly or masses felt. Normal bowel sounds heard. Central nervous system: Alert and oriented. No focal neurological deficits. Extremities: Symmetric 5 x 5 power.   Data Reviewed: I have personally reviewed following labs and imaging studies  CBC:  Recent Labs Lab 05/30/16 1823 05/30/16 1835 05/31/16 0549  WBC 9.7  --  10.1  NEUTROABS  --   --  8.3*  HGB 13.2 14.6 13.1  HCT 41.5 43.0 41.3  MCV 101.7*  --  102.0*  PLT 220  --  0000000   Basic Metabolic Panel:  Recent Labs Lab 05/30/16 1823 05/30/16 1835 05/31/16 0549  NA 138 141 140  K 5.2* 5.3* 4.0  CL 103 103 101  CO2 24  --  30  GLUCOSE 238* 245* 204*  BUN 44* 53* 37*  CREATININE 1.31* 1.30* 1.23*  CALCIUM 9.9  --  9.8   Cardiac  Enzymes:  Recent Labs Lab 05/30/16 2348 05/31/16 0549  TROPONINI <0.03 0.05*   CBG:  Recent Labs Lab 05/30/16 2345 05/31/16 0841  GLUCAP 173* 218*   Recent Results (from the past 240 hour(s))  MRSA PCR Screening     Status: None   Collection Time: 05/31/16 12:08 AM  Result Value Ref Range Status   MRSA by PCR NEGATIVE NEGATIVE Final    Comment:        The GeneXpert MRSA Assay (FDA approved for NASAL specimens only), is one component of a comprehensive MRSA colonization surveillance program. It is not intended to diagnose MRSA infection nor to guide or monitor treatment for MRSA infections.       Radiology Studies: Dg Chest Portable 1 View  Result Date: 05/30/2016 CLINICAL DATA:  Shortness of breath.  Dyspnea x1 day.  Chest pain EXAM: PORTABLE CHEST 1 VIEW COMPARISON:  10/06/2015 FINDINGS: The cardiomediastinal silhouette is slightly enlarged. There is pulmonary central vascular congestion. Diffuse interstitial hazy opacities compatible with pulmonary edema. Small bilateral effusions. Focal opacity at the left lung base could relate to atelectasis are in infiltrates. No pneumothorax. Atherosclerosis of the aorta. IMPRESSION: 1. Cardiomegaly with central vascular congestion and mild to moderate diffuse pulmonary edema. Small bilateral effusions. 2. Focal opacity at the left lung base could relate to focal atelectasis or infiltrate. 3. Atherosclerotic vascular calcifications of the aorta Electronically Signed   By: Donavan Foil M.D.   On: 05/30/2016 18:17      Scheduled Meds: . acidophilus  1 capsule Oral Q2200  . amLODipine  5 mg Oral Daily  . aspirin EC  81 mg Oral BID  . enoxaparin (LOVENOX) injection  30 mg Subcutaneous Daily  . furosemide  40 mg Intravenous Q12H  . insulin aspart  0-15 Units Subcutaneous TID WC  . insulin aspart  0-5 Units Subcutaneous QHS  . insulin detemir  10 Units Subcutaneous QHS  . metoprolol  100 mg Oral BID  . pantoprazole  40 mg Oral  Daily  . pravastatin  20 mg Oral QPM  . sodium chloride flush  3 mL Intravenous Q12H   Continuous Infusions:    LOS: 1 day    Time spent: 20 minutes    Faye Ramsay, MD [facility] Pager [phone]  If 7PM-7AM, please contact night-coverage [email or web address] Password [email or web address] 05/31/2016, 12:19 PM

## 2016-05-31 NOTE — Progress Notes (Signed)
  Echocardiogram 2D Echocardiogram has been performed.  Johny Chess 05/31/2016, 11:26 AM

## 2016-05-31 NOTE — Progress Notes (Addendum)
Inpatient Diabetes Program Recommendations  AACE/ADA: New Consensus Statement on Inpatient Glycemic Control (2015)  Target Ranges:  Prepandial:   less than 140 mg/dL      Peak postprandial:   less than 180 mg/dL (1-2 hours)      Critically ill patients:  140 - 180 mg/dL   Lab Results  Component Value Date   GLUCAP 218 (H) 05/31/2016   HGBA1C 7.6 05/22/2016    Review of Glycemic Control Results for Emma Yu, Emma Yu (MRN [id number]) as of 05/31/2016 11:12  Ref. Range 05/30/2016 23:45 05/31/2016 08:41  Glucose-Capillary Latest Ref Range: 65 - 99 mg/dL 173 (H) 218 (H)   Diabetes history: DM Outpatient Diabetes medications: Insulin pump(sees Dr. Loanne Drilling) Current orders for Inpatient glycemic control: Levemir 10 units daily + Novolog correction 0-15 units tid + 0-5 units hs  Inpatient Diabetes Program Recommendations:    Reviewed last visit @ Dr. Loanne Drilling 05/22/16: Basal rate = 0.8 units/hr= 19.2/24 hrs. Humalog bolus=10 units per meal Correction -1 unit drops blood glucose 25. Insulin pump off @ present. While patient's pump is off, may need to increase Levemir to bid and add meal coverage 4 units if eats 50%.  Thank you, Nani Gasser. [name], RN, MSN, CDE Inpatient Glycemic Control Team Team Pager [phone] (8am-5pm) 05/31/2016 11:24 AM

## 2016-06-01 ENCOUNTER — Inpatient Hospital Stay (HOSPITAL_COMMUNITY): Payer: Medicare Other

## 2016-06-01 DIAGNOSIS — J9601 Acute respiratory failure with hypoxia: Secondary | ICD-10-CM

## 2016-06-01 DIAGNOSIS — R0600 Dyspnea, unspecified: Secondary | ICD-10-CM

## 2016-06-01 LAB — GLUCOSE, CAPILLARY
GLUCOSE-CAPILLARY: 172 mg/dL — AB (ref 65–99)
GLUCOSE-CAPILLARY: 175 mg/dL — AB (ref 65–99)
Glucose-Capillary: 325 mg/dL — ABNORMAL HIGH (ref 65–99)
Glucose-Capillary: 200 mg/dL — ABNORMAL HIGH (ref 65–99)

## 2016-06-01 LAB — BASIC METABOLIC PANEL
ANION GAP: 9 (ref 5–15)
BUN: 35 mg/dL — ABNORMAL HIGH (ref 6–20)
CALCIUM: 9.7 mg/dL (ref 8.9–10.3)
CO2: 32 mmol/L (ref 22–32)
CREATININE: 1.3 mg/dL — AB (ref 0.44–1.00)
Chloride: 98 mmol/L — ABNORMAL LOW (ref 101–111)
GFR calc Af Amer: 42 mL/min — ABNORMAL LOW (ref >60)
GFR, EST NON AFRICAN AMERICAN: 36 mL/min — AB (ref >60)
GLUCOSE: 155 mg/dL — AB (ref 65–99)
Potassium: 4.3 mmol/L (ref 3.5–5.1)
Sodium: 139 mmol/L (ref 135–145)

## 2016-06-01 LAB — CBC
HCT: 38.7 % (ref 36.0–46.0)
HEMOGLOBIN: 12.1 g/dL (ref 12.0–15.0)
MCH: 31.8 pg (ref 26.0–34.0)
MCHC: 31.3 g/dL (ref 30.0–36.0)
MCV: 101.6 fL — ABNORMAL HIGH (ref 78.0–100.0)
PLATELETS: 203 10*3/uL (ref 150–400)
RBC: 3.81 MIL/uL — ABNORMAL LOW (ref 3.87–5.11)
RDW: 18 % — AB (ref 11.5–15.5)
WBC: 8.9 10*3/uL (ref 4.0–10.5)

## 2016-06-01 LAB — BRAIN NATRIURETIC PEPTIDE: B Natriuretic Peptide: 188 pg/mL — ABNORMAL HIGH (ref 0.0–100.0)

## 2016-06-01 MED ORDER — LEVOFLOXACIN IN D5W 500 MG/100ML IV SOLN
500.0000 mg | INTRAVENOUS | Status: DC
  Administered 2016-06-01: 500 mg via INTRAVENOUS
  Filled 2016-06-01 (×2): qty 100

## 2016-06-01 NOTE — Progress Notes (Signed)
SUBJECTIVE:  Still SOB on O2  OBJECTIVE:   Vitals:   Vitals:   06/01/16 0436 06/01/16 0437 06/01/16 0748 06/01/16 1132  BP:  (!) 143/56 (!) 166/65 (!) 164/43  Pulse:  69 65 74  Resp:  18 19 16   Temp:   98.6 F (37 C) 97.7 F (36.5 C)  TempSrc:   Axillary Axillary  SpO2:  100% 92% 94%  Weight: 145 lb 8.1 oz (66 kg)     Height:       I&O's:   Intake/Output Summary (Last 24 hours) at 06/01/16 1357 Last data filed at 06/01/16 0700  Gross per 24 hour  Intake              240 ml  Output                0 ml  Net              240 ml   TELEMETRY: Reviewed telemetry pt in atrial fibrillation with CVR   PHYSICAL EXAM General: Well developed, well nourished, in no acute distress Head: Eyes PERRLA, No xanthomas.   Normal cephalic and atramatic  Lungs:   Crackles at bases and decreased BS at bases Heart:   Irregularly irregular S1 S2 Pulses are 2+ & equal. Abdomen: Bowel sounds are positive, abdomen soft and non-tender without masses Msk:  Back normal, normal gait. Normal strength and tone for age. Extremities:  No clubbing, cyanosis or edema.  DP +1 Neuro: Alert and oriented X 3. Psych:  Good affect, responds appropriately   LABS: Basic Metabolic Panel:  Recent Labs  05/31/16 0549 06/01/16 0215  NA 140 139  K 4.0 4.3  CL 101 98*  CO2 30 32  GLUCOSE 204* 155*  BUN 37* 35*  CREATININE 1.23* 1.30*  CALCIUM 9.8 9.7   Liver Function Tests: No results for input(s): AST, ALT, ALKPHOS, BILITOT, PROT, ALBUMIN in the last 72 hours. No results for input(s): LIPASE, AMYLASE in the last 72 hours. CBC:  Recent Labs  05/31/16 0549 06/01/16 0215  WBC 10.1 8.9  NEUTROABS 8.3*  --   HGB 13.1 12.1  HCT 41.3 38.7  MCV 102.0* 101.6*  PLT 193 203   Cardiac Enzymes:  Recent Labs  05/30/16 2348 05/31/16 0549  TROPONINI <0.03 0.05*   BNP: Invalid input(s): POCBNP D-Dimer: No results for input(s): DDIMER in the last 72 hours. Hemoglobin A1C: No results for input(s):  HGBA1C in the last 72 hours. Fasting Lipid Panel: No results for input(s): CHOL, HDL, LDLCALC, TRIG, CHOLHDL, LDLDIRECT in the last 72 hours. Thyroid Function Tests: No results for input(s): TSH, T4TOTAL, T3FREE, THYROIDAB in the last 72 hours.  Invalid input(s): FREET3 Anemia Panel: No results for input(s): VITAMINB12, FOLATE, FERRITIN, TIBC, IRON, RETICCTPCT in the last 72 hours. Coag Panel:   Lab Results  Component Value Date   INR 1.18 12/25/2011   INR 1.01 01/10/2011   INR 1.84 (H) 10/26/2010    RADIOLOGY: Ct Angio Chest Pe W Or Wo Contrast  Result Date: 05/31/2016 CLINICAL DATA:  Acute onset of dyspnea.  Initial encounter. EXAM: CT ANGIOGRAPHY CHEST WITH CONTRAST TECHNIQUE: Multidetector CT imaging of the chest was performed using the standard protocol during bolus administration of intravenous contrast. Multiplanar CT image reconstructions and MIPs were obtained to evaluate the vascular anatomy. CONTRAST:  80 mL of Isovue 370 IV contrast COMPARISON:  Chest radiograph performed 05/30/2016 FINDINGS: Cardiovascular: There is no evidence of significant pulmonary embolus. Evaluation for pulmonary embolus  is mildly suboptimal in areas of airspace consolidation. The heart is mildly enlarged, with biatrial enlargement. Scattered calcification is noted along the aortic arch and descending thoracic aorta. The proximal great vessels appear grossly intact, aside from scattered calcification. Mediastinum/Nodes: A mildly enlarged 1.3 cm subcarinal node is noted, of uncertain significance. Mildly prominent right paratracheal nodes are also seen. No pericardial effusion is identified. The thyroid gland contains a small 9 mm hypodensity, likely benign given its size. No axillary lymphadenopathy is seen. Lungs/Pleura: Small bilateral pleural effusions are noted. There is partial consolidation of both lower lung lobes, which may reflect pulmonary edema or possibly infection. No pneumothorax is seen. Upper  Abdomen: The visualized portions of the liver and spleen are grossly unremarkable. Musculoskeletal: No acute osseous abnormalities are identified. The visualized musculature is unremarkable in appearance. Review of the MIP images confirms the above findings. IMPRESSION: 1. No evidence of significant pulmonary embolus. 2. Small bilateral pleural effusions noted. Partial consolidation of both lower lung lobes, which may reflect pulmonary edema or possibly infection. 3. Mild cardiomegaly, with biatrial enlargement. 4. Mildly enlarged mediastinal nodes, measuring up to 1.3 cm in short axis. Electronically Signed   By: Garald Balding M.D.   On: 05/31/2016 23:35   Dg Chest Portable 1 View  Result Date: 05/30/2016 CLINICAL DATA:  Shortness of breath.  Dyspnea x1 day.  Chest pain EXAM: PORTABLE CHEST 1 VIEW COMPARISON:  10/06/2015 FINDINGS: The cardiomediastinal silhouette is slightly enlarged. There is pulmonary central vascular congestion. Diffuse interstitial hazy opacities compatible with pulmonary edema. Small bilateral effusions. Focal opacity at the left lung base could relate to atelectasis are in infiltrates. No pneumothorax. Atherosclerosis of the aorta. IMPRESSION: 1. Cardiomegaly with central vascular congestion and mild to moderate diffuse pulmonary edema. Small bilateral effusions. 2. Focal opacity at the left lung base could relate to focal atelectasis or infiltrate. 3. Atherosclerotic vascular calcifications of the aorta Electronically Signed   By: Donavan Foil M.D.   On: 05/30/2016 18:17    ASSESSMENT AND PLAN:    Principal Problem: 1.  Acute respiratory failure (HCC) - cxray with CHF as well as LLL opacity ? PNA - mgt per IM, would try to get her off NRB when possible.  Her O2 sats have been in the mid 90's but apparently she desated off of the NRB last night.  Chest CT showed no PE but there was partial consolidation of lower lobes in both lungs ? Edema vs. Infection.  [name] afebrile with  normal WBC.  2. Acute on Chronic Diast CHF, class 4 - dry weight unclear, may be in the low 140s - BUN/Cr are lower than they were in September. - difficult to assess volume status.  [name] incontinent so cannot rely on urine output measurements.  [name] 4lbs down from admit weight (149>>145lbs)  -  K+ was elevated on admission at 5.3, not on supplement, not on ACE/ARB and hemolysis not mentioned.  Follow BMET closely. -  Suspect that acute decompensation due to dietary sodium indiscretion.  She eats out a lot and especially at [place].  We have discussed sodium restricted diet and I will get the heart failure coordinator to talk with patient. She will need to weigh herself daily at home -  Her BNP is still elevated at 188 and she still has crackles at her bases.  Will continue IV diuretics for now and follow weights.  Repeat cxray today since it has been difficult to wean her O2.  3.  Chronic atrial fibrillation (HCC) - rate is ok, continue BB - CHA2DS2VASc=6 (female, age x 2, DVT x 2, HTN, CHF) - no anticoag 2nd fall risk - currently on DVT Lovenox  4.  Minimally elevated Trop most likely secondary to CHF and acute hypoxia with RV strain and enzyme leak.  Continue to cycle.  5.  HTN - BP borderline controlled.  Continue to monitor. Continue amlodipine and BB.  Otherwise, per IM Active Problems:   Hyperlipidemia with target LDL less than 70   Essential hypertension, benign   Chronic renal insufficiency, stage III (moderate)   CHF exacerbation (HCC)   Diabetes mellitus type 2 in nonobese (HCC)    Fransico Him, MD  06/01/2016  1:57 PM

## 2016-06-01 NOTE — Progress Notes (Signed)
Patient ID: Emma Yu, female   DOB: [birth year], 82 y.o.   MRN: [id number]    PROGRESS NOTE    Emma Yu  [id number] DOB: 04-06-1927 DOA: 05/30/2016  PCP: [name], MD  Cardiologist: Dr. Johnsie Cancel   Brief Narrative:  82 y.o. female with medical history significant of CHF, HTN, HLD, CAD, A. fib, DM type II; who presented to [facility] ED with main concern of 1-2 days duration of progressively worsening dyspnea initially present with exertion and has progressed to dyspnea at rest in the past 24 hours prior to this admission. This has been associated with non productive cough, fatigue and several pounds weight gain despite poor oral intake.  Assessment & Plan:   Principal Problem:   Acute respiratory failure with hypoxia, mild troponin elevation  - suspect this is due to acute diastolic CHF exacerbation but underlying PNA can not be entirely ruled out - CT chest was somewhat concerning for bibasilar PNA vs edema - pt did have low grade fevers 99.6 F overnight, will add Levaquin empirically  - pt is currently on lasix 40 mg IV BID and weight trend since admission: 149 --> 150 --> 145 lbs this AM  - we will attempt to taper off NRB - ECHO was already done, last one done in January 2017 with EF 70% - appreciate cardiology team assistance  - pt must stay in SDU for now   Active Problems:   Essential hypertension, benign - continue Norvasc and Metoprolol, lasix     Chronic renal insufficiency, stage III (moderate) - review of records indicate Cr at baseline ~ 1.4 - 1.7 - it appears that Cr remains at pt's baseline - will however, continue to monitor this closely as pt on IV Lasix  - BMP in AM    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   Objective: Vitals:   06/01/16 0437 06/01/16 0748 06/01/16 1132 06/01/16 1556  BP: (!) 143/56 (!) 166/65 (!) 164/43 (!) 133/58  Pulse: 69 65 74 (!) 2  Resp: 18 19 16 20   Temp:  98.6 F (37 C) 97.7 F (36.5 C) 97.9 F (36.6 C)  TempSrc:  Axillary Axillary Axillary  SpO2: 100% 92% 94% 98%  Weight:      Height:        Intake/Output Summary (Last 24 hours) at 06/01/16 1619 Last data filed at 06/01/16 1500  Gross per 24 hour  Intake              590 ml  Output                0 ml  Net              590 ml   Filed Weights   05/30/16 2345 05/31/16 0500 06/01/16 0436  Weight: 67.8 kg (149 lb 7.6 oz) 68.1 kg (150 lb 2.1 oz) 66 kg (145 lb 8.1 oz)    Examination:  General exam:  Appears calm, on NRB Respiratory system: slight tachypnea and diminished breath sounds at bases with bilateral crackles  Cardiovascular system: S1 & S2 heard, RRR. No murmurs, rubs, gallops or clicks. . Gastrointestinal system: Abdomen is nondistended, soft and nontender. No organomegaly or masses felt. Normal bowel sounds heard. Central nervous system: Alert and oriented. No focal neurological deficits. Extremities: Symmetric 5 x 5 power, bilateral LE venous stasis changes    Data Reviewed: I have personally reviewed following labs and imaging studies  CBC:  Recent Labs Lab 05/30/16 1823 05/30/16 1835 05/31/16 0549 06/01/16 0215  WBC 9.7  --  10.1 8.9  NEUTROABS  --   --  8.3*  --   HGB 13.2 14.6 13.1 12.1  HCT 41.5 43.0 41.3 38.7  MCV 101.7*  --  102.0* 101.6*  PLT 220  --  [id number]   Basic Metabolic Panel:  Recent Labs Lab 05/30/16 1823 05/30/16 1835 05/31/16 0549 06/01/16 0215  NA 138 141 140 139  K 5.2* 5.3* 4.0 4.3  CL 103 103  101 98*  CO2 24  --  30 32  GLUCOSE 238* 245* 204* 155*  BUN 44* 53* 37* 35*  CREATININE 1.31* 1.30* 1.23* 1.30*  CALCIUM 9.9  --  9.8 9.7   Cardiac Enzymes:  Recent Labs Lab 05/30/16 2348 05/31/16 0549  TROPONINI <0.03 0.05*   CBG:  Recent Labs Lab 05/31/16 1602 05/31/16 2139 06/01/16 0751 06/01/16 1132 06/01/16 1557  GLUCAP 165* 164* 172* 325* 175*   Recent Results (from the past 240 hour(s))  MRSA PCR Screening     Status: None   Collection Time: 05/31/16 12:08 AM  Result Value Ref Range Status   MRSA by PCR NEGATIVE NEGATIVE Final    Comment:        The GeneXpert MRSA Assay (FDA approved for NASAL specimens only), is one component of a comprehensive MRSA colonization surveillance program. It is not intended to diagnose MRSA infection nor to guide or monitor treatment for MRSA infections.       Radiology Studies: Ct Angio Chest Pe W Or Wo Contrast  Result Date: 05/31/2016 CLINICAL DATA:  Acute onset of dyspnea.  Initial encounter. EXAM: CT ANGIOGRAPHY CHEST WITH CONTRAST TECHNIQUE: Multidetector CT imaging of the chest was performed using the standard protocol during bolus administration of intravenous contrast. Multiplanar CT image reconstructions and MIPs were obtained to evaluate the vascular anatomy. CONTRAST:  80 mL of Isovue 370 IV contrast COMPARISON:  Chest radiograph performed 05/30/2016 FINDINGS: Cardiovascular: There is no evidence of significant pulmonary embolus. Evaluation for pulmonary embolus is mildly suboptimal in areas of airspace consolidation. The heart is mildly enlarged, with biatrial enlargement. Scattered calcification is noted along the aortic arch and descending thoracic aorta. The proximal great vessels appear grossly intact, aside from scattered calcification. Mediastinum/Nodes: A mildly enlarged 1.3 cm subcarinal node is noted, of uncertain significance. Mildly prominent right paratracheal nodes are also seen. No pericardial effusion  is identified. The thyroid gland contains a small 9 mm hypodensity, likely benign given its size. No axillary lymphadenopathy is seen. Lungs/Pleura: Small bilateral pleural effusions are noted. There is partial consolidation of both lower lung lobes, which may reflect pulmonary edema or possibly infection. No pneumothorax is seen. Upper Abdomen: The visualized portions of the liver and spleen are grossly unremarkable. Musculoskeletal: No acute osseous abnormalities are identified. The visualized musculature is unremarkable in appearance. Review of the MIP images confirms the above findings. IMPRESSION: 1. No evidence of significant pulmonary embolus. 2. Small  bilateral pleural effusions noted. Partial consolidation of both lower lung lobes, which may reflect pulmonary edema or possibly infection. 3. Mild cardiomegaly, with biatrial enlargement. 4. Mildly enlarged mediastinal nodes, measuring up to 1.3 cm in short axis. Electronically Signed   By: Garald Balding M.D.   On: 05/31/2016 23:35   Dg Chest Port 1 View  Result Date: 06/01/2016 CLINICAL DATA:  Short of breath EXAM: PORTABLE CHEST 1 VIEW COMPARISON:  05/30/2016 FINDINGS: Improvement in vascular congestion and pulmonary edema. Mild edema remains. Bibasilar atelectasis and small bilateral effusions. IMPRESSION: Improvement in congestive heart failure with pulmonary edema. Electronically Signed   By: Franchot Gallo M.D.   On: 06/01/2016 14:55   Dg Chest Portable 1 View  Result Date: 05/30/2016 CLINICAL DATA:  Shortness of breath.  Dyspnea x1 day.  Chest pain EXAM: PORTABLE CHEST 1 VIEW COMPARISON:  10/06/2015 FINDINGS: The cardiomediastinal silhouette is slightly enlarged. There is pulmonary central vascular congestion. Diffuse interstitial hazy opacities compatible with pulmonary edema. Small bilateral effusions. Focal opacity at the left lung base could relate to atelectasis are in infiltrates. No pneumothorax. Atherosclerosis of the aorta.  IMPRESSION: 1. Cardiomegaly with central vascular congestion and mild to moderate diffuse pulmonary edema. Small bilateral effusions. 2. Focal opacity at the left lung base could relate to focal atelectasis or infiltrate. 3. Atherosclerotic vascular calcifications of the aorta Electronically Signed   By: Donavan Foil M.D.   On: 05/30/2016 18:17      Scheduled Meds: . acidophilus  1 capsule Oral Q2200  . amLODipine  5 mg Oral Daily  . aspirin EC  81 mg Oral BID  . enoxaparin (LOVENOX) injection  30 mg Subcutaneous Daily  . furosemide  80 mg Intravenous Q12H  . insulin aspart  0-15 Units Subcutaneous TID WC  . insulin aspart  0-5 Units Subcutaneous QHS  . insulin detemir  10 Units Subcutaneous QHS  . levofloxacin (LEVAQUIN) IV  500 mg Intravenous Q24H  . metoprolol  100 mg Oral BID  . pantoprazole  40 mg Oral Daily  . pravastatin  20 mg Oral QPM  . sodium chloride flush  3 mL Intravenous Q12H   Continuous Infusions:    LOS: 2 days    Time spent: 20 minutes    Faye Ramsay, MD [facility] Pager [phone]  If 7PM-7AM, please contact night-coverage [email or web address] Password [email or web address] 06/01/2016, 4:19 PM

## 2016-06-02 DIAGNOSIS — N179 Acute kidney failure, unspecified: Secondary | ICD-10-CM

## 2016-06-02 DIAGNOSIS — I5033 Acute on chronic diastolic (congestive) heart failure: Secondary | ICD-10-CM

## 2016-06-02 LAB — CBC
HEMATOCRIT: 40.8 % (ref 36.0–46.0)
HEMOGLOBIN: 13.1 g/dL (ref 12.0–15.0)
MCH: 32 pg (ref 26.0–34.0)
MCHC: 32.1 g/dL (ref 30.0–36.0)
MCV: 99.5 fL (ref 78.0–100.0)
PLATELETS: 216 10*3/uL (ref 150–400)
RBC: 4.1 MIL/uL (ref 3.87–5.11)
RDW: 17.2 % — ABNORMAL HIGH (ref 11.5–15.5)
WBC: 9 10*3/uL (ref 4.0–10.5)

## 2016-06-02 LAB — BASIC METABOLIC PANEL
ANION GAP: 10 (ref 5–15)
BUN: 37 mg/dL — ABNORMAL HIGH (ref 6–20)
CHLORIDE: 96 mmol/L — AB (ref 101–111)
CO2: 31 mmol/L (ref 22–32)
CREATININE: 1.51 mg/dL — AB (ref 0.44–1.00)
Calcium: 10.2 mg/dL (ref 8.9–10.3)
GFR calc non Af Amer: 30 mL/min — ABNORMAL LOW (ref >60)
GFR, EST AFRICAN AMERICAN: 35 mL/min — AB (ref >60)
Glucose, Bld: 186 mg/dL — ABNORMAL HIGH (ref 65–99)
POTASSIUM: 4.1 mmol/L (ref 3.5–5.1)
SODIUM: 137 mmol/L (ref 135–145)

## 2016-06-02 LAB — GLUCOSE, CAPILLARY
GLUCOSE-CAPILLARY: 245 mg/dL — AB (ref 65–99)
GLUCOSE-CAPILLARY: 221 mg/dL — AB (ref 65–99)
Glucose-Capillary: 180 mg/dL — ABNORMAL HIGH (ref 65–99)
Glucose-Capillary: 209 mg/dL — ABNORMAL HIGH (ref 65–99)

## 2016-06-02 MED ORDER — POTASSIUM CHLORIDE CRYS ER 20 MEQ PO TBCR
20.0000 meq | EXTENDED_RELEASE_TABLET | Freq: Once | ORAL | Status: AC
  Administered 2016-06-02: 20 meq via ORAL
  Filled 2016-06-02: qty 1

## 2016-06-02 MED ORDER — LEVOFLOXACIN IN D5W 500 MG/100ML IV SOLN
500.0000 mg | INTRAVENOUS | Status: DC
  Filled 2016-06-02: qty 100

## 2016-06-02 NOTE — Progress Notes (Signed)
Patient ID: Emma Yu, female   DOB: 05-25-1927, 82 y.o.   MRN: [id number]   SUBJECTIVE: Still short of breath, wearing NRBM.  Incontinent with IV Lasix.   Echo (10/17) with EF 65-70%, mild AI, mild mitral stenosis.   Scheduled Meds: . acidophilus  1 capsule Oral Q2200  . amLODipine  5 mg Oral Daily  . aspirin EC  81 mg Oral BID  . enoxaparin (LOVENOX) injection  30 mg Subcutaneous Daily  . furosemide  80 mg Intravenous Q12H  . insulin aspart  0-15 Units Subcutaneous TID WC  . insulin aspart  0-5 Units Subcutaneous QHS  . insulin detemir  10 Units Subcutaneous QHS  . [START ON 06/03/2016] levofloxacin (LEVAQUIN) IV  500 mg Intravenous Q48H  . metoprolol  100 mg Oral BID  . pantoprazole  40 mg Oral Daily  . potassium chloride  20 mEq Oral Once  . pravastatin  20 mg Oral QPM  . sodium chloride flush  3 mL Intravenous Q12H   Continuous Infusions:  PRN Meds:.sodium chloride, acetaminophen, acetaminophen, ondansetron (ZOFRAN) IV, sodium chloride flush, traMADol    Vitals:   06/02/16 0400 06/02/16 0558 06/02/16 0800 06/02/16 0814  BP: (!) 156/47  (!) 146/57 (!) 146/57  Pulse: 74  77 72  Resp: 16  17 18   Temp:  97.8 F (36.6 C)  97.9 F (36.6 C)  TempSrc:  Axillary  Axillary  SpO2: 97%  93% 94%  Weight:      Height:        Intake/Output Summary (Last 24 hours) at 06/02/16 1025 Last data filed at 06/02/16 0900  Gross per 24 hour  Intake              710 ml  Output                0 ml  Net              710 ml    LABS: Basic Metabolic Panel:  Recent Labs  06/01/16 0215 06/02/16 0240  NA 139 137  K 4.3 4.1  CL 98* 96*  CO2 32 31  GLUCOSE 155* 186*  BUN 35* 37*  CREATININE 1.30* 1.51*  CALCIUM 9.7 10.2   Liver Function Tests: No results for input(s): AST, ALT, ALKPHOS, BILITOT, PROT, ALBUMIN in the last 72 hours. No results for input(s): LIPASE, AMYLASE in the last 72 hours. CBC:  Recent Labs  05/31/16 0549 06/01/16 0215 06/02/16 0240  WBC 10.1  8.9 9.0  NEUTROABS 8.3*  --   --   HGB 13.1 12.1 13.1  HCT 41.3 38.7 40.8  MCV 102.0* 101.6* 99.5  PLT 193 203 216   Cardiac Enzymes:  Recent Labs  05/30/16 2348 05/31/16 0549  TROPONINI <0.03 0.05*   BNP: Invalid input(s): POCBNP D-Dimer: No results for input(s): DDIMER in the last 72 hours. Hemoglobin A1C: No results for input(s): HGBA1C in the last 72 hours. Fasting Lipid Panel: No results for input(s): CHOL, HDL, LDLCALC, TRIG, CHOLHDL, LDLDIRECT in the last 72 hours. Thyroid Function Tests: No results for input(s): TSH, T4TOTAL, T3FREE, THYROIDAB in the last 72 hours.  Invalid input(s): FREET3 Anemia Panel: No results for input(s): VITAMINB12, FOLATE, FERRITIN, TIBC, IRON, RETICCTPCT in the last 72 hours.  RADIOLOGY: Ct Angio Chest Pe W Or Wo Contrast  Result Date: 05/31/2016 CLINICAL DATA:  Acute onset of dyspnea.  Initial encounter. EXAM: CT ANGIOGRAPHY CHEST WITH CONTRAST TECHNIQUE: Multidetector CT imaging of the chest was performed using the  standard protocol during bolus administration of intravenous contrast. Multiplanar CT image reconstructions and MIPs were obtained to evaluate the vascular anatomy. CONTRAST:  80 mL of Isovue 370 IV contrast COMPARISON:  Chest radiograph performed 05/30/2016 FINDINGS: Cardiovascular: There is no evidence of significant pulmonary embolus. Evaluation for pulmonary embolus is mildly suboptimal in areas of airspace consolidation. The heart is mildly enlarged, with biatrial enlargement. Scattered calcification is noted along the aortic arch and descending thoracic aorta. The proximal great vessels appear grossly intact, aside from scattered calcification. Mediastinum/Nodes: A mildly enlarged 1.3 cm subcarinal node is noted, of uncertain significance. Mildly prominent right paratracheal nodes are also seen. No pericardial effusion is identified. The thyroid gland contains a small 9 mm hypodensity, likely benign given its size. No axillary  lymphadenopathy is seen. Lungs/Pleura: Small bilateral pleural effusions are noted. There is partial consolidation of both lower lung lobes, which may reflect pulmonary edema or possibly infection. No pneumothorax is seen. Upper Abdomen: The visualized portions of the liver and spleen are grossly unremarkable. Musculoskeletal: No acute osseous abnormalities are identified. The visualized musculature is unremarkable in appearance. Review of the MIP images confirms the above findings. IMPRESSION: 1. No evidence of significant pulmonary embolus. 2. Small bilateral pleural effusions noted. Partial consolidation of both lower lung lobes, which may reflect pulmonary edema or possibly infection. 3. Mild cardiomegaly, with biatrial enlargement. 4. Mildly enlarged mediastinal nodes, measuring up to 1.3 cm in short axis. Electronically Signed   By: Garald Balding M.D.   On: 05/31/2016 23:35   Dg Chest Port 1 View  Result Date: 06/01/2016 CLINICAL DATA:  Short of breath EXAM: PORTABLE CHEST 1 VIEW COMPARISON:  05/30/2016 FINDINGS: Improvement in vascular congestion and pulmonary edema. Mild edema remains. Bibasilar atelectasis and small bilateral effusions. IMPRESSION: Improvement in congestive heart failure with pulmonary edema. Electronically Signed   By: Franchot Gallo M.D.   On: 06/01/2016 14:55   Dg Chest Portable 1 View  Result Date: 05/30/2016 CLINICAL DATA:  Shortness of breath.  Dyspnea x1 day.  Chest pain EXAM: PORTABLE CHEST 1 VIEW COMPARISON:  10/06/2015 FINDINGS: The cardiomediastinal silhouette is slightly enlarged. There is pulmonary central vascular congestion. Diffuse interstitial hazy opacities compatible with pulmonary edema. Small bilateral effusions. Focal opacity at the left lung base could relate to atelectasis are in infiltrates. No pneumothorax. Atherosclerosis of the aorta. IMPRESSION: 1. Cardiomegaly with central vascular congestion and mild to moderate diffuse pulmonary edema. Small  bilateral effusions. 2. Focal opacity at the left lung base could relate to focal atelectasis or infiltrate. 3. Atherosclerotic vascular calcifications of the aorta Electronically Signed   By: Donavan Foil M.D.   On: 05/30/2016 18:17    PHYSICAL EXAM General: NAD Neck: JVP 12 cm, no thyromegaly or thyroid nodule.  Lungs: Decreased breath sounds at bases.  CV: Nondisplaced PMI.  Heart irregular S1/S2, no S3/S4, 1/6 SEM RUSB.  No peripheral edema.   Abdomen: Soft, nontender, no hepatosplenomegaly, no distention.  Neurologic: Alert and oriented x 3.  Psych: Normal affect. Extremities: No clubbing or cyanosis.   TELEMETRY: Reviewed telemetry pt in atrial fibrillation with controlled rate  ASSESSMENT AND PLAN: 82 yo with history of diastolic CHF, chronic atrial fibrillation, PAD, DM, HTN presented with acute dyspnea/pulmonary edema found to have acute on chronic diastolic CHF.  1. Acute on chronic diastolic CHF: Echo with EF 65-70% this admission.  On exam, she remains volume overloaded and still with NRBM.  She had CTA chest this admission with no PE, cannot rule out  basilar PNA.   - Continue Lasix 80 mg IV bid but will have to watch creatinine closely.  - Needs foley catheter, multiple episodes of incontinence with IV Lasix.  2. AKI on CKD: Creatinine up to 1.5.  Follow closely with diuresis.  Also at risk for contrast-mediated nephropathy given CTA chest done earlier this admission.  3. ?PNA: On levofloxacin per primary service.  4. Atrial fibrillation: Chronic, rate-controlled.  Has not been anticoagulated due to falls.   [name] 06/02/2016 10:30 AM

## 2016-06-02 NOTE — Evaluation (Signed)
Physical Therapy Evaluation Patient Details Name: Emma Yu MRN: [id number] DOB: 09-07-1926 Today's Date: 06/02/2016   History of Present Illness  82 y.o.femalewith medical history significant of CHF, HTN, HLD, CAD, A. fib, DM type II; who presents with progressively worsening shortness breath. In route with EMS the patient was noted to have O2 saturations as low as 70s in the field for which she was placed on a nonrebreather mask at 15 liter. Upon admission into the emergency department patient was switched to a BiPAP mask with improvement of respiratory status. CHF exacerbation vs pna (bil pulmonary effusions)    Clinical Impression  Pt admitted with above diagnosis. Patient reports she is home alone 9-12 hrs per day (her son works). She may progress to a modified independent level and be able to return directly home, however anticipate slower progress and recommend SNF (which pt refuses). Pt currently with functional limitations due to the deficits listed below (see PT Problem List).  Pt will benefit from skilled PT to increase their independence and safety with mobility to allow discharge to the venue listed below.       Follow Up Recommendations SNF;Supervision for mobility/OOB (Patient does not want SNF, only wants to go home. If discharged home, will need HHPT, HHOT, and an aide for ADLs)    Equipment Recommendations  None recommended by PT    Recommendations for Other Services OT consult     Precautions / Restrictions Precautions Precautions: Fall Precaution Comments: reports last fell 5 mos ago; fell asleep reading the paper and fell from chair      Mobility  Bed Mobility Overal bed mobility: Needs Assistance Bed Mobility: Supine to Sit     Supine to sit: Mod assist;HOB elevated     General bed mobility comments: exit from ICU bed with air mattress deflated (diffcult surface to scoot to get to EOB in sitting)  Transfers Overall transfer level: Needs  assistance Equipment used: Rolling walker (2 wheeled) Transfers: Sit to/from [facility] Sit to Stand: Min assist;From elevated surface Stand pivot transfers: Min assist       General transfer comment: incr time and effort to come to stand  Ambulation/Gait             General Gait Details: deferred due to decr SaO2 to 87% on 4L during pivot to [employer]    Modified Rankin (Stroke Patients Only)       Balance Overall balance assessment: Needs assistance Sitting-balance support: Bilateral upper extremity supported;Feet unsupported Sitting balance-Leahy Scale: Poor Sitting balance - Comments: difficult to assess on ICU air mattress   Standing balance support: Bilateral upper extremity supported Standing balance-Leahy Scale: Poor Standing balance comment: hesitant and guarded due to light dizziness                             Pertinent Vitals/Pain Pain Assessment: Faces Pain Score:  (pt unable to give #) Faces Pain Scale: Hurts even more Pain Location: Rt hip (s/p THR) Pain Descriptors / Indicators: Grimacing;Guarding;Sharp Pain Intervention(s): Limited activity within patient's tolerance;Monitored during session;Repositioned    Home Living Family/patient expects to be discharged to:: Private residence Living Arrangements: Children Available Help at Discharge: Family Type of Home: House Home Access: Stairs to enter Entrance Stairs-Rails: None Entrance Stairs-Number of Steps: 1+1 Home Layout: Laundry or work area in basement;Two level (and freezer) Home Equipment:  Walker - 4 wheels;Shower seat;Bedside commode;Walker - 2 wheels (BSC over toilet)      Prior Function Level of Independence: Needs assistance   Gait / Transfers Assistance Needed: uses rollator at all times (except in basement uses RW)           Hand Dominance        Extremity/Trunk Assessment   Upper Extremity Assessment:  Generalized weakness           Lower Extremity Assessment: Generalized weakness      Cervical / Trunk Assessment: Kyphotic  Communication   Communication: No difficulties  Cognition Arousal/Alertness: Awake/alert Behavior During Therapy: WFL for tasks assessed/performed Overall Cognitive Status: Within Functional Limits for tasks assessed                      General Comments      Exercises General Exercises - Lower Extremity Ankle Circles/Pumps: AROM;Both;10 reps Long Arc Quad: AROM;Both;10 reps Heel Slides: AROM;Both;5 reps   Assessment/Plan    PT Assessment Patient needs continued PT services  PT Problem List Decreased strength;Decreased activity tolerance;Decreased balance;Decreased mobility;Cardiopulmonary status limiting activity;Pain          PT Treatment Interventions DME instruction;Gait training;Stair training;Functional mobility training;Therapeutic activities;Therapeutic exercise;Balance training;Patient/family education    PT Goals (Current goals can be found in the Care Plan section)  Acute Rehab PT Goals Patient Stated Goal: go home tomorrow PT Goal Formulation: With patient Time For Goal Achievement: 06/09/16 Potential to Achieve Goals: Good    Frequency Min 3X/week   Barriers to discharge Decreased caregiver support son works 8-10 hr days    Co-evaluation               End of Session Equipment Utilized During Treatment: Gait belt;Oxygen Activity Tolerance: Treatment limited secondary to medical complications (Comment) (decr SaO2, however no incr dyspnea) Patient left: in chair;with call bell/phone within reach;with chair alarm set           Time: [id number] PT Time Calculation (min) (ACUTE ONLY): 41 min   Charges:   PT Evaluation $PT Eval Moderate Complexity: 1 Procedure PT Treatments $Therapeutic Exercise: 8-22 mins   PT G Codes:        Jony Ladnier 06/23/2016, 3:55 PM  Pager [phone]

## 2016-06-02 NOTE — Progress Notes (Signed)
Patient ID: Emma Yu, female   DOB: 01-28-1927, 82 y.o.   MRN: [id number]    PROGRESS NOTE    Emma Yu  [id number] DOB: 07/27/1927 DOA: 05/30/2016  PCP: [name], MD   Cardiologist: Dr. Johnsie Cancel   Brief Narrative:  82 y.o. female with medical history significant of CHF, HTN, HLD, CAD, A. fib, DM type II; who presented to [facility] ED with main concern of 1-2 days duration of progressively worsening dyspnea initially present with exertion and has progressed to dyspnea at rest in the past 24 hours prior to this admission. This has been associated with non productive cough, fatigue and several pounds weight gain despite poor oral intake.  Assessment & Plan:   Principal Problem:   Acute respiratory failure with hypoxia, mild troponin elevation  - suspect this is due to acute diastolic CHF exacerbation but underlying PNA can not be entirely ruled out - CT chest was somewhat concerning for bibasilar PNA vs edema - pt did have low grade fevers 99.6 F overnight 10/12 - 10/13, we added Levaquin empirically 10/13, will continue same regimen for now - pt is currently on lasix 80 mg IV BID and weight trend since admission: 149 --> 150 --> 145 --> 141 lbs this AM  - ECHO was already done, last one done in January 2017 with EF 70% - appreciate cardiology team assistance  - pt must stay in SDU for now until we are able to take her off venti mask  Active Problems:   Essential hypertension, benign - continue Norvasc and Metoprolol, lasix     Chronic renal insufficiency, stage III (moderate) - review of records indicate Cr at baseline ~ 1.4 - 1.7 - it appears that Cr remains at pt's baseline but is up in the past 24 hours  - will however, continue to monitor this closely as pt on IV Lasix  - BMP in AM    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   Objective: Vitals:   06/02/16 0800 06/02/16 0814 06/02/16 1100 06/02/16 1101  BP: (!) 146/57 (!) 146/57 135/60 135/60  Pulse: 77 72 66 68  Resp: 17 18 15 17   Temp:  97.9 F (36.6 C)  97.8 F (36.6 C)  TempSrc:  Axillary  Axillary  SpO2: 93% 94% 94% 93%  Weight:      Height:        Intake/Output Summary (Last 24 hours) at 06/02/16 1111 Last data filed at 06/02/16 0900  Gross per 24 hour  Intake              710 ml  Output                0 ml  Net              710 ml   Filed Weights   05/31/16 0500 06/01/16 0436 06/02/16 0240  Weight: 68.1 kg (150 lb 2.1 oz) 66 kg (145 lb  8.1 oz) 64.4 kg (141 lb 15.6 oz)    Examination:  General exam: Appears calm, on venti mask Respiratory system: slight tachypnea and diminished breath sounds at bases with very minimal bilateral crackles  Cardiovascular system: S1 & S2 heard, RRR. No murmurs, rubs, gallops or clicks. . Gastrointestinal system: Abdomen is nondistended, soft and nontender. No organomegaly or masses felt. Normal bowel sounds heard. Central nervous system: Alert and oriented. No focal neurological deficits. Extremities: Symmetric 5 x 5 power, bilateral LE venous stasis changes    Data Reviewed: I have personally reviewed following labs and imaging studies  CBC:  Recent Labs Lab 05/30/16 1823 05/30/16 1835 05/31/16 0549 06/01/16 0215 06/02/16 0240  WBC 9.7  --  10.1 8.9 9.0  NEUTROABS  --   --  8.3*  --   --   HGB 13.2 14.6 13.1 12.1 13.1  HCT 41.5 43.0 41.3 38.7 40.8  MCV 101.7*  --  102.0* 101.6* 99.5  PLT 220  --  193 203 [id number]   Basic Metabolic  Panel:  Recent Labs Lab 05/30/16 1823 05/30/16 1835 05/31/16 0549 06/01/16 0215 06/02/16 0240  NA 138 141 140 139 137  K 5.2* 5.3* 4.0 4.3 4.1  CL 103 103 101 98* 96*  CO2 24  --  30 32 31  GLUCOSE 238* 245* 204* 155* 186*  BUN 44* 53* 37* 35* 37*  CREATININE 1.31* 1.30* 1.23* 1.30* 1.51*  CALCIUM 9.9  --  9.8 9.7 10.2   Cardiac Enzymes:  Recent Labs Lab 05/30/16 2348 05/31/16 0549  TROPONINI <0.03 0.05*   CBG:  Recent Labs Lab 06/01/16 0751 06/01/16 1132 06/01/16 1557 06/01/16 2109 06/02/16 0813  GLUCAP 172* 325* 175* 200* 180*   Recent Results (from the past 240 hour(s))  MRSA PCR Screening     Status: None   Collection Time: 05/31/16 12:08 AM  Result Value Ref Range Status   MRSA by PCR NEGATIVE NEGATIVE Final    Comment:        The GeneXpert MRSA Assay (FDA approved for NASAL specimens only), is one component of a comprehensive MRSA colonization surveillance program. It is not intended to diagnose MRSA infection nor to guide or monitor treatment for MRSA infections.       Radiology Studies: Ct Angio Chest Pe W Or Wo Contrast  Result Date: 05/31/2016 CLINICAL DATA:  Acute onset of dyspnea.  Initial encounter. EXAM: CT ANGIOGRAPHY CHEST WITH CONTRAST TECHNIQUE: Multidetector CT imaging of the chest was performed using the standard protocol during bolus administration of intravenous contrast. Multiplanar CT image reconstructions and MIPs were obtained to evaluate the vascular anatomy. CONTRAST:  80 mL of Isovue 370 IV contrast COMPARISON:  Chest radiograph performed 05/30/2016 FINDINGS: Cardiovascular: There is no evidence of significant pulmonary embolus. Evaluation for pulmonary embolus is mildly suboptimal in areas of airspace consolidation. The heart is mildly enlarged, with biatrial enlargement. Scattered calcification is noted along the aortic arch and descending thoracic aorta. The proximal great vessels appear grossly intact, aside from scattered  calcification. Mediastinum/Nodes: A mildly enlarged 1.3 cm subcarinal node is noted, of uncertain significance. Mildly prominent right paratracheal nodes are also seen. No pericardial effusion is identified. The thyroid gland contains a small 9 mm hypodensity, likely benign given its size. No axillary lymphadenopathy is seen. Lungs/Pleura: Small bilateral pleural effusions are noted. There is partial consolidation of both lower lung lobes, which may reflect pulmonary edema or possibly infection. No pneumothorax is seen. Upper Abdomen: The visualized portions of the liver  and spleen are grossly unremarkable. Musculoskeletal: No acute osseous abnormalities are identified. The visualized musculature is unremarkable in appearance. Review of the MIP images confirms the above findings. IMPRESSION: 1. No evidence of significant pulmonary embolus. 2. Small bilateral pleural effusions noted. Partial consolidation of both lower lung lobes, which may reflect pulmonary edema or possibly infection. 3. Mild cardiomegaly, with biatrial enlargement. 4. Mildly enlarged mediastinal nodes, measuring up to 1.3 cm in short axis. Electronically Signed   By: Garald Balding M.D.   On: 05/31/2016 23:35   Dg Chest Port 1 View  Result Date: 06/01/2016 CLINICAL DATA:  Short of breath EXAM: PORTABLE CHEST 1 VIEW COMPARISON:  05/30/2016 FINDINGS: Improvement in vascular congestion and pulmonary edema. Mild edema remains. Bibasilar atelectasis and small bilateral effusions. IMPRESSION: Improvement in congestive heart failure with pulmonary edema. Electronically Signed   By: Franchot Gallo M.D.   On: 06/01/2016 14:55      Scheduled Meds: . acidophilus  1 capsule Oral Q2200  . amLODipine  5 mg Oral Daily  . aspirin EC  81 mg Oral BID  . enoxaparin (LOVENOX) injection  30 mg Subcutaneous Daily  . furosemide  80 mg Intravenous Q12H  . insulin aspart  0-15 Units Subcutaneous TID WC  . insulin aspart  0-5 Units Subcutaneous QHS  .  insulin detemir  10 Units Subcutaneous QHS  . [START ON 06/03/2016] levofloxacin (LEVAQUIN) IV  500 mg Intravenous Q48H  . metoprolol  100 mg Oral BID  . pantoprazole  40 mg Oral Daily  . pravastatin  20 mg Oral QPM  . sodium chloride flush  3 mL Intravenous Q12H   Continuous Infusions:    LOS: 3 days    Time spent: 20 minutes    Faye Ramsay, MD [facility] Pager [phone]  If 7PM-7AM, please contact night-coverage [email or web address] Password [email or web address] 06/02/2016, 11:11 AM

## 2016-06-03 LAB — BASIC METABOLIC PANEL
Anion gap: 12 (ref 5–15)
BUN: 43 mg/dL — AB (ref 6–20)
CO2: 32 mmol/L (ref 22–32)
CREATININE: 1.59 mg/dL — AB (ref 0.44–1.00)
Calcium: 10.5 mg/dL — ABNORMAL HIGH (ref 8.9–10.3)
Chloride: 94 mmol/L — ABNORMAL LOW (ref 101–111)
GFR calc Af Amer: 33 mL/min — ABNORMAL LOW (ref >60)
GFR, EST NON AFRICAN AMERICAN: 28 mL/min — AB (ref >60)
GLUCOSE: 289 mg/dL — AB (ref 65–99)
POTASSIUM: 3.8 mmol/L (ref 3.5–5.1)
SODIUM: 138 mmol/L (ref 135–145)

## 2016-06-03 LAB — URINE MICROSCOPIC-ADD ON

## 2016-06-03 LAB — BASIC METABOLIC PANEL WITH GFR
Anion gap: 10 (ref 5–15)
BUN: 42 mg/dL — ABNORMAL HIGH (ref 6–20)
CO2: 29 mmol/L (ref 22–32)
Calcium: 10.1 mg/dL (ref 8.9–10.3)
Chloride: 98 mmol/L — ABNORMAL LOW (ref 101–111)
Creatinine, Ser: 1.48 mg/dL — ABNORMAL HIGH (ref 0.44–1.00)
GFR calc Af Amer: 36 mL/min — ABNORMAL LOW
GFR calc non Af Amer: 31 mL/min — ABNORMAL LOW
Glucose, Bld: 161 mg/dL — ABNORMAL HIGH (ref 65–99)
Potassium: 5.5 mmol/L — ABNORMAL HIGH (ref 3.5–5.1)
Sodium: 137 mmol/L (ref 135–145)

## 2016-06-03 LAB — URINALYSIS, ROUTINE W REFLEX MICROSCOPIC
Bilirubin Urine: NEGATIVE
Glucose, UA: NEGATIVE mg/dL
Ketones, ur: NEGATIVE mg/dL
Nitrite: POSITIVE — AB
Protein, ur: NEGATIVE mg/dL
Specific Gravity, Urine: 1.013 (ref 1.005–1.030)
pH: 5.5 (ref 5.0–8.0)

## 2016-06-03 LAB — CBC
HCT: 41.8 % (ref 36.0–46.0)
Hemoglobin: 13.5 g/dL (ref 12.0–15.0)
MCH: 31.8 pg (ref 26.0–34.0)
MCHC: 32.3 g/dL (ref 30.0–36.0)
MCV: 98.6 fL (ref 78.0–100.0)
Platelets: 228 10*3/uL (ref 150–400)
RBC: 4.24 MIL/uL (ref 3.87–5.11)
RDW: 17.1 % — ABNORMAL HIGH (ref 11.5–15.5)
WBC: 7.5 10*3/uL (ref 4.0–10.5)

## 2016-06-03 LAB — GLUCOSE, CAPILLARY
GLUCOSE-CAPILLARY: 213 mg/dL — AB (ref 65–99)
GLUCOSE-CAPILLARY: 165 mg/dL — AB (ref 65–99)
Glucose-Capillary: 190 mg/dL — ABNORMAL HIGH (ref 65–99)
Glucose-Capillary: 282 mg/dL — ABNORMAL HIGH (ref 65–99)

## 2016-06-03 LAB — STREP PNEUMONIAE URINARY ANTIGEN: Strep Pneumo Urinary Antigen: NEGATIVE

## 2016-06-03 MED ORDER — LEVOFLOXACIN 500 MG PO TABS
500.0000 mg | ORAL_TABLET | ORAL | Status: DC
  Administered 2016-06-03 – 2016-06-05 (×3): 500 mg via ORAL
  Filled 2016-06-03 (×3): qty 1

## 2016-06-03 MED ORDER — FUROSEMIDE 40 MG PO TABS
40.0000 mg | ORAL_TABLET | Freq: Every evening | ORAL | Status: DC
  Administered 2016-06-04: 40 mg via ORAL
  Filled 2016-06-03 (×2): qty 1

## 2016-06-03 MED ORDER — FUROSEMIDE 80 MG PO TABS
80.0000 mg | ORAL_TABLET | Freq: Every day | ORAL | Status: DC
  Administered 2016-06-04 – 2016-06-05 (×2): 80 mg via ORAL
  Filled 2016-06-03 (×2): qty 1

## 2016-06-03 NOTE — Progress Notes (Signed)
Patient ID: Emma Yu, female   DOB: 1926/12/18, 82 y.o.   MRN: [id number]   SUBJECTIVE: Breathing better today, now on nasal cannula.  Some diuresis but not all recorded due to incontinence.  Now has foley.   Echo (10/17) with EF 65-70%, mild AI, mild mitral stenosis.   Scheduled Meds: . acidophilus  1 capsule Oral Q2200  . amLODipine  5 mg Oral Daily  . aspirin EC  81 mg Oral BID  . enoxaparin (LOVENOX) injection  30 mg Subcutaneous Daily  . [START ON 06/04/2016] furosemide  40 mg Oral QPM  . [START ON 06/04/2016] furosemide  80 mg Oral Q breakfast  . insulin aspart  0-15 Units Subcutaneous TID WC  . insulin aspart  0-5 Units Subcutaneous QHS  . insulin detemir  10 Units Subcutaneous QHS  . levofloxacin (LEVAQUIN) IV  500 mg Intravenous Q48H  . metoprolol  100 mg Oral BID  . pantoprazole  40 mg Oral Daily  . pravastatin  20 mg Oral QPM  . sodium chloride flush  3 mL Intravenous Q12H   Continuous Infusions:  PRN Meds:.sodium chloride, acetaminophen, acetaminophen, ondansetron (ZOFRAN) IV, sodium chloride flush, traMADol    Vitals:   06/02/16 2336 06/03/16 0304 06/03/16 0500 06/03/16 0739  BP: (!) 148/66 (!) 132/57  (!) 161/50  Pulse: 74 69  81  Resp: 20 16  20   Temp: 97.8 F (36.6 C) 97.8 F (36.6 C)  97.7 F (36.5 C)  TempSrc: Oral Oral  Oral  SpO2: 97% 96%  96%  Weight:   142 lb (64.4 kg)   Height:        Intake/Output Summary (Last 24 hours) at 06/03/16 0956 Last data filed at 06/03/16 [id number]  Gross per 24 hour  Intake              360 ml  Output             1350 ml  Net             -990 ml    LABS: Basic Metabolic Panel:  Recent Labs  06/02/16 0240 06/03/16 0309  NA 137 137  K 4.1 5.5*  CL 96* 98*  CO2 31 29  GLUCOSE 186* 161*  BUN 37* 42*  CREATININE 1.51* 1.48*  CALCIUM 10.2 10.1   Liver Function Tests: No results for input(s): AST, ALT, ALKPHOS, BILITOT, PROT, ALBUMIN in the last 72 hours. No results for input(s): LIPASE, AMYLASE in  the last 72 hours. CBC:  Recent Labs  06/02/16 0240 06/03/16 0309  WBC 9.0 7.5  HGB 13.1 13.5  HCT 40.8 41.8  MCV 99.5 98.6  PLT 216 228   Cardiac Enzymes: No results for input(s): CKTOTAL, CKMB, CKMBINDEX, TROPONINI in the last 72 hours. BNP: Invalid input(s): POCBNP D-Dimer: No results for input(s): DDIMER in the last 72 hours. Hemoglobin A1C: No results for input(s): HGBA1C in the last 72 hours. Fasting Lipid Panel: No results for input(s): CHOL, HDL, LDLCALC, TRIG, CHOLHDL, LDLDIRECT in the last 72 hours. Thyroid Function Tests: No results for input(s): TSH, T4TOTAL, T3FREE, THYROIDAB in the last 72 hours.  Invalid input(s): FREET3 Anemia Panel: No results for input(s): VITAMINB12, FOLATE, FERRITIN, TIBC, IRON, RETICCTPCT in the last 72 hours.  RADIOLOGY: Ct Angio Chest Pe W Or Wo Contrast  Result Date: 05/31/2016 CLINICAL DATA:  Acute onset of dyspnea.  Initial encounter. EXAM: CT ANGIOGRAPHY CHEST WITH CONTRAST TECHNIQUE: Multidetector CT imaging of the chest was performed using the standard protocol  during bolus administration of intravenous contrast. Multiplanar CT image reconstructions and MIPs were obtained to evaluate the vascular anatomy. CONTRAST:  80 mL of Isovue 370 IV contrast COMPARISON:  Chest radiograph performed 05/30/2016 FINDINGS: Cardiovascular: There is no evidence of significant pulmonary embolus. Evaluation for pulmonary embolus is mildly suboptimal in areas of airspace consolidation. The heart is mildly enlarged, with biatrial enlargement. Scattered calcification is noted along the aortic arch and descending thoracic aorta. The proximal great vessels appear grossly intact, aside from scattered calcification. Mediastinum/Nodes: A mildly enlarged 1.3 cm subcarinal node is noted, of uncertain significance. Mildly prominent right paratracheal nodes are also seen. No pericardial effusion is identified. The thyroid gland contains a small 9 mm hypodensity, likely  benign given its size. No axillary lymphadenopathy is seen. Lungs/Pleura: Small bilateral pleural effusions are noted. There is partial consolidation of both lower lung lobes, which may reflect pulmonary edema or possibly infection. No pneumothorax is seen. Upper Abdomen: The visualized portions of the liver and spleen are grossly unremarkable. Musculoskeletal: No acute osseous abnormalities are identified. The visualized musculature is unremarkable in appearance. Review of the MIP images confirms the above findings. IMPRESSION: 1. No evidence of significant pulmonary embolus. 2. Small bilateral pleural effusions noted. Partial consolidation of both lower lung lobes, which may reflect pulmonary edema or possibly infection. 3. Mild cardiomegaly, with biatrial enlargement. 4. Mildly enlarged mediastinal nodes, measuring up to 1.3 cm in short axis. Electronically Signed   By: Garald Balding M.D.   On: 05/31/2016 23:35   Dg Chest Port 1 View  Result Date: 06/01/2016 CLINICAL DATA:  Short of breath EXAM: PORTABLE CHEST 1 VIEW COMPARISON:  05/30/2016 FINDINGS: Improvement in vascular congestion and pulmonary edema. Mild edema remains. Bibasilar atelectasis and small bilateral effusions. IMPRESSION: Improvement in congestive heart failure with pulmonary edema. Electronically Signed   By: Franchot Gallo M.D.   On: 06/01/2016 14:55   Dg Chest Portable 1 View  Result Date: 05/30/2016 CLINICAL DATA:  Shortness of breath.  Dyspnea x1 day.  Chest pain EXAM: PORTABLE CHEST 1 VIEW COMPARISON:  10/06/2015 FINDINGS: The cardiomediastinal silhouette is slightly enlarged. There is pulmonary central vascular congestion. Diffuse interstitial hazy opacities compatible with pulmonary edema. Small bilateral effusions. Focal opacity at the left lung base could relate to atelectasis are in infiltrates. No pneumothorax. Atherosclerosis of the aorta. IMPRESSION: 1. Cardiomegaly with central vascular congestion and mild to moderate  diffuse pulmonary edema. Small bilateral effusions. 2. Focal opacity at the left lung base could relate to focal atelectasis or infiltrate. 3. Atherosclerotic vascular calcifications of the aorta Electronically Signed   By: Donavan Foil M.D.   On: 05/30/2016 18:17    PHYSICAL EXAM General: NAD Neck: JVP 8 cm, no thyromegaly or thyroid nodule.  Lungs: Decreased breath sounds at bases.  CV: Nondisplaced PMI.  Heart irregular S1/S2, no S3/S4, 1/6 SEM RUSB.  No peripheral edema.   Abdomen: Soft, nontender, no hepatosplenomegaly, no distention.  Neurologic: Alert and oriented x 3.  Psych: Normal affect. Extremities: No clubbing or cyanosis.   TELEMETRY: Reviewed telemetry pt in atrial fibrillation with controlled rate  ASSESSMENT AND PLAN: 82 yo with history of diastolic CHF, chronic atrial fibrillation, PAD, DM, HTN presented with acute dyspnea/pulmonary edema found to have acute on chronic diastolic CHF.  1. Acute on chronic diastolic CHF: Echo with EF 65-70% this admission.  She had CTA chest this admission with no PE, cannot rule out basilar PNA.  On exam, volume status looks better.  Breathing more comfortably  and now on nasal cannula. - Got IV Lasix today, will stop further IV Lasix and transition to Lasix 80 qam/40 qpm starting tomorrow morning.   2. AKI on CKD: At risk for contrast-mediated nephropathy given CTA chest done earlier this admission. Creatinine stable today. However, K is 5.5.  Will recheck now.  Stopping IV Lasix today.  3. ?PNA: On levofloxacin per primary service.  4. Atrial fibrillation: Chronic, rate-controlled.  Has not been anticoagulated due to falls.  5. Disposition: SNF recommended but patient refuses.   [name] 06/03/2016 9:56 AM

## 2016-06-03 NOTE — Progress Notes (Signed)
2qPatient ID: Emma Yu, female   DOB: 09-Oct-1926, 82 y.o.   MRN: [id number]    PROGRESS NOTE    Emma Yu  [id number] DOB: 1927/06/17 DOA: 05/30/2016  PCP: [name], MD   Cardiologist: Dr. Johnsie Cancel   Brief Narrative:  82 y.o. female with medical history significant of CHF, HTN, HLD, CAD, A. fib, DM type II; who presented to [facility] ED with main concern of 1-2 days duration of progressively worsening dyspnea initially present with exertion and has progressed to dyspnea at rest in the past 24 hours prior to this admission. This has been associated with non productive cough, fatigue and several pounds weight gain despite poor oral intake.  Assessment & Plan:   Principal Problem:   Acute respiratory failure with hypoxia, mild troponin elevation  - suspect this is due to acute diastolic CHF exacerbation but underlying PNA can not be entirely ruled out - CT chest was somewhat concerning for bibasilar PNA vs edema - pt did have low grade fevers 99.6 F overnight 10/12 - 10/13, we added Levaquin empirically 10/13, will continue same regimen for now - pt is currently on lasix 80 mg IV BID and weight trend since admission: 149 --> 150 --> 145 --> 142 lbs this AM  - per cardiology lasix will be changed to PO starting tomorrow  - ECHO was already done, last one done in January 2017 with EF 70% - appreciate cardiology team assistance  - pt more comfortable this AM, OK to transfer to tele unit   Active Problems:   Essential hypertension, benign - continue Norvasc and Metoprolol, lasix     Chronic renal insufficiency, stage III (moderate) - review of records indicate Cr at baseline ~ 1.4 - 1.7 - it appears that Cr remains at pt's baseline - BMP in AM    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   Objective: Vitals:   06/03/16 0304 06/03/16 0500 06/03/16 0739 06/03/16 1100  BP: (!) 132/57  (!) 161/50 (!) 153/56  Pulse: 69  81   Resp: 16  20   Temp: 97.8 F (36.6 C)  97.7 F (36.5 C) 98.3 F (36.8 C)  TempSrc: Oral  Oral Oral  SpO2: 96%  96% 95%  Weight:  64.4 kg (142 lb)    Height:        Intake/Output Summary (Last 24 hours) at 06/03/16 1337 Last data filed at 06/03/16 1100  Gross per 24 hour  Intake              720 ml  Output             1000 ml  Net             -280 ml   Filed Weights   06/01/16 0436 06/02/16 0240 06/03/16 0500  Weight: 66 kg (145 lb 8.1 oz) 64.4 kg (  141 lb 15.6 oz) 64.4 kg (142 lb)    Examination:  General exam: Appears calm, on venti mask Respiratory system: still diminished breath sounds at bases with very minimal bilateral crackles  Cardiovascular system: S1 & S2 heard, RRR. No murmurs, rubs, gallops or clicks. . Gastrointestinal system: Abdomen is nondistended, soft and nontender. No organomegaly or masses felt. Normal bowel sounds heard. Central nervous system: Alert and oriented. No focal neurological deficits. Extremities: Symmetric 5 x 5 power, bilateral LE venous stasis changes    Data Reviewed: I have personally reviewed following labs and imaging studies  CBC:  Recent Labs Lab 05/30/16 1823 05/30/16 1835 05/31/16 0549 06/01/16 0215 06/02/16 0240 06/03/16 0309  WBC 9.7  --  10.1 8.9 9.0 7.5  NEUTROABS  --   --  8.3*  --   --   --   HGB 13.2 14.6 13.1 12.1 13.1 13.5  HCT 41.5 43.0 41.3 38.7 40.8 41.8  MCV 101.7*  --  102.0* 101.6* 99.5  98.6  PLT 220  --  193 203 216 [id number]   Basic Metabolic Panel:  Recent Labs Lab 05/31/16 0549 06/01/16 0215 06/02/16 0240 06/03/16 0309 06/03/16 1051  NA 140 139 137 137 138  K 4.0 4.3 4.1 5.5* 3.8  CL 101 98* 96* 98* 94*  CO2 30 32 31 29 32  GLUCOSE 204* 155* 186* 161* 289*  BUN 37* 35* 37* 42* 43*  CREATININE 1.23* 1.30* 1.51* 1.48* 1.59*  CALCIUM 9.8 9.7 10.2 10.1 10.5*   Cardiac Enzymes:  Recent Labs Lab 05/30/16 2348 05/31/16 0549  TROPONINI <0.03 0.05*   CBG:  Recent Labs Lab 06/02/16 1104 06/02/16 1639 06/02/16 2122 06/03/16 0737 06/03/16 1116  GLUCAP 245* 209* 221* 190* 282*   Recent Results (from the past 240 hour(s))  MRSA PCR Screening     Status: None   Collection Time: 05/31/16 12:08 AM  Result Value Ref Range Status   MRSA by PCR NEGATIVE NEGATIVE Final    Comment:        The GeneXpert MRSA Assay (FDA approved for NASAL specimens only), is one component of a comprehensive MRSA colonization surveillance program. It is not intended to diagnose MRSA infection nor to guide or monitor treatment for MRSA infections.       Radiology Studies: Dg Chest Port 1 View  Result Date: 06/01/2016 CLINICAL DATA:  Short of breath EXAM: PORTABLE CHEST 1 VIEW COMPARISON:  05/30/2016 FINDINGS: Improvement in vascular congestion and pulmonary edema. Mild edema remains. Bibasilar atelectasis and small bilateral effusions. IMPRESSION: Improvement in congestive heart failure with pulmonary edema. Electronically Signed   By: Franchot Gallo M.D.   On: 06/01/2016 14:55      Scheduled Meds: . acidophilus  1 capsule Oral Q2200  . amLODipine  5 mg Oral Daily  . aspirin EC  81 mg Oral BID  . enoxaparin (LOVENOX) injection  30 mg Subcutaneous Daily  . [START ON 06/04/2016] furosemide  40 mg Oral QPM  . [START ON 06/04/2016] furosemide  80 mg Oral Q breakfast  . insulin aspart  0-15 Units Subcutaneous TID WC  . insulin aspart  0-5 Units Subcutaneous QHS  . insulin  detemir  10 Units Subcutaneous QHS  . levofloxacin  500 mg Oral Q48H  . metoprolol  100 mg Oral BID  . pantoprazole  40 mg Oral Daily  . pravastatin  20 mg Oral QPM  . sodium chloride flush  3 mL Intravenous Q12H   Continuous Infusions:    LOS:  4 days    Time spent: 20 minutes    Faye Ramsay, MD [facility] Pager [phone]  If 7PM-7AM, please contact night-coverage [email or web address] Password [email or web address] 06/03/2016, 1:37 PM

## 2016-06-04 DIAGNOSIS — I5023 Acute on chronic systolic (congestive) heart failure: Secondary | ICD-10-CM

## 2016-06-04 DIAGNOSIS — E785 Hyperlipidemia, unspecified: Secondary | ICD-10-CM

## 2016-06-04 LAB — GLUCOSE, CAPILLARY
GLUCOSE-CAPILLARY: 199 mg/dL — AB (ref 65–99)
GLUCOSE-CAPILLARY: 242 mg/dL — AB (ref 65–99)
Glucose-Capillary: 192 mg/dL — ABNORMAL HIGH (ref 65–99)

## 2016-06-04 LAB — BASIC METABOLIC PANEL
ANION GAP: 11 (ref 5–15)
BUN: 46 mg/dL — ABNORMAL HIGH (ref 6–20)
CO2: 29 mmol/L (ref 22–32)
Calcium: 10.6 mg/dL — ABNORMAL HIGH (ref 8.9–10.3)
Chloride: 98 mmol/L — ABNORMAL LOW (ref 101–111)
Creatinine, Ser: 1.51 mg/dL — ABNORMAL HIGH (ref 0.44–1.00)
GFR, EST AFRICAN AMERICAN: 35 mL/min — AB (ref >60)
GFR, EST NON AFRICAN AMERICAN: 30 mL/min — AB (ref >60)
GLUCOSE: 195 mg/dL — AB (ref 65–99)
Potassium: 3.7 mmol/L (ref 3.5–5.1)
SODIUM: 138 mmol/L (ref 135–145)

## 2016-06-04 MED ORDER — AMLODIPINE BESYLATE 5 MG PO TABS
7.5000 mg | ORAL_TABLET | Freq: Every day | ORAL | Status: DC
  Administered 2016-06-05: 7.5 mg via ORAL
  Filled 2016-06-04: qty 1

## 2016-06-04 MED ORDER — AMLODIPINE BESYLATE 2.5 MG PO TABS
2.5000 mg | ORAL_TABLET | Freq: Once | ORAL | Status: AC
  Administered 2016-06-04: 2.5 mg via ORAL
  Filled 2016-06-04: qty 1

## 2016-06-04 NOTE — NC FL2 (Signed)
[place] LEVEL OF CARE SCREENING TOOL     IDENTIFICATION  Patient Name: Emma Yu Birthdate: 01/15/1927 Sex: female Admission Date (Current Location): 05/30/2016  [facility] and [place] Number:  [employer] and Address:  [facility] [place], [place] [address] [place], [place] [address]      Provider Number: [id number]  Attending Physician Name and Address:  Theodis Blaze, MD  Relative Name and Phone Number:  Nicole Kindred, son, [phone]    Current Level of Care: Hospital Recommended Level of Care: [place] Prior Approval Number:    Date Approved/Denied:   PASRR Number: [id number]  Discharge Plan: SNF    Current Diagnoses: Patient Active Problem List   Diagnosis Date Noted  . Dyspnea   . Diabetes mellitus type 2 in nonobese ([place]) 05/31/2016  . Acute on chronic congestive heart failure ([place])   . CHF exacerbation ([place]) 05/30/2016  . Acute respiratory failure ([place]) 05/30/2016  . Decubitus ulcer of buttock, stage 1 10/06/2015  . Other seasonal allergic rhinitis 08/31/2015  . PAD (peripheral artery disease) ([place]) 07/05/2015  . Routine general medical examination at a health care facility 11/08/2014  . Chronic diastolic heart failure ([place]) 09/13/2014  . SOB (shortness of breath)   . Chronic atrial fibrillation ([place]) 06/29/2014  . Chronic renal insufficiency, stage III (moderate) 06/16/2014  . Diabetic peripheral neuropathy ([place]) 12/21/2013  . Back pain, chronic 06/18/2013  . Mitral stenosis   . Aortic stenosis-moderate   . CAD-minimal 2012   . Diabetes mellitus type 1 with peripheral artery disease ([place]) 10/22/2011  . Chronic venous insufficiency 11/04/2009  . Hyperlipidemia with target LDL less than 70 08/04/2009  . Essential hypertension, benign 08/04/2009  . Atherosclerotic peripheral vascular disease with ulceration ([place]) 08/04/2009  . Osteoarthritis 08/04/2009    Orientation RESPIRATION BLADDER Height &  Weight     Self  O2 (Nasal cannula 2L) Incontinent, Indwelling catheter (Urinary catheter) Weight: 62.7 kg (138 lb 3.7 oz) Height:  5\' 4"  (162.6 cm)  BEHAVIORAL SYMPTOMS/MOOD NEUROLOGICAL BOWEL NUTRITION STATUS      Continent Diet (Please see DC Summary)  AMBULATORY STATUS COMMUNICATION OF NEEDS Skin   Extensive Assist Verbally Normal                       Personal Care Assistance Level of Assistance  Bathing, Feeding, Dressing Bathing Assistance: Maximum assistance Feeding assistance: Independent Dressing Assistance: Limited assistance     Functional Limitations Info             SPECIAL CARE FACTORS FREQUENCY  PT (By licensed PT)     PT Frequency: 5x/week              Contractures Contractures Info: Not present    Additional Factors Info  Code Status, Allergies, Insulin Sliding Scale Code Status Info: Full Allergies Info: Carvedilol, Augmentin Amoxicillin-pot Clavulanate, Clindamycin/lincomycin, Diltiazem Hcl, Fenofibrate, Hctz Hydrochlorothiazide, Nisoldipine, Nitroglycerin, Propranolol, Septra Sulfamethoxazole-trimethoprim, Tekturna Aliskiren Fumarate, Valturna Aliskiren-valsartan   Insulin Sliding Scale Info: 5x per day       Current Medications (06/04/2016):  This is the current hospital active medication list Current Facility-Administered Medications  Medication Dose Route Frequency Provider Last Rate Last Dose  . 0.9 %  sodium chloride infusion  250 mL Intravenous PRN Norval Morton, MD      . acetaminophen (TYLENOL) tablet 1,000 mg  1,000 mg Oral Q6H PRN Norval Morton, MD      . acetaminophen (TYLENOL)  tablet 650 mg  650 mg Oral Q4H PRN Norval Morton, MD      . acidophilus (RISAQUAD) capsule 1 capsule  1 capsule Oral Q2200 Norval Morton, MD   1 capsule at 06/03/16 2141  . amLODipine (NORVASC) tablet 2.5 mg  2.5 mg Oral Once Skeet Latch, MD      . Derrill Memo ON 06/05/2016] amLODipine (NORVASC) tablet 7.5 mg  7.5 mg Oral Daily Skeet Latch,  MD      . aspirin EC tablet 81 mg  81 mg Oral BID Norval Morton, MD   81 mg at 06/04/16 0917  . enoxaparin (LOVENOX) injection 30 mg  30 mg Subcutaneous Daily Rondell A Tamala Julian, MD   30 mg at 06/04/16 [id number]  . furosemide (LASIX) tablet 40 mg  40 mg Oral QPM Larey Dresser, MD      . furosemide (LASIX) tablet 80 mg  80 mg Oral Q breakfast Larey Dresser, MD   80 mg at 06/04/16 0754  . insulin aspart (novoLOG) injection 0-15 Units  0-15 Units Subcutaneous TID WC Norval Morton, MD   5 Units at 06/04/16 1221  . insulin aspart (novoLOG) injection 0-5 Units  0-5 Units Subcutaneous QHS Norval Morton, MD   2 Units at 06/02/16 2249  . insulin detemir (LEVEMIR) injection 10 Units  10 Units Subcutaneous QHS Norval Morton, MD   10 Units at 06/03/16 2141  . levofloxacin (LEVAQUIN) tablet 500 mg  500 mg Oral Q48H Melburn Popper, RPH   500 mg at 06/03/16 1422  . metoprolol tartrate (LOPRESSOR) tablet 100 mg  100 mg Oral BID Norval Morton, MD   100 mg at 06/04/16 0917  . ondansetron (ZOFRAN) injection 4 mg  4 mg Intravenous Q6H PRN Norval Morton, MD      . pantoprazole (PROTONIX) EC tablet 40 mg  40 mg Oral Daily Norval Morton, MD   40 mg at 06/04/16 0917  . pravastatin (PRAVACHOL) tablet 20 mg  20 mg Oral QPM Rondell Charmayne Sheer, MD   20 mg at 06/03/16 1700  . sodium chloride flush (NS) 0.9 % injection 3 mL  3 mL Intravenous Q12H Norval Morton, MD   3 mL at 06/04/16 1224  . sodium chloride flush (NS) 0.9 % injection 3 mL  3 mL Intravenous PRN Norval Morton, MD      . traMADol Veatrice Bourbon) tablet 50 mg  50 mg Oral Q6H PRN Norval Morton, MD   50 mg at 06/02/16 0220     Discharge Medications: Please see discharge summary for a list of discharge medications.  Relevant Imaging Results:  Relevant Lab Results:   Additional Information SSN: [place], [place]

## 2016-06-04 NOTE — Progress Notes (Signed)
Inpatient Diabetes Program Recommendations  AACE/ADA: New Consensus Statement on Inpatient Glycemic Control (2015)  Target Ranges:  Prepandial:   less than 140 mg/dL      Peak postprandial:   less than 180 mg/dL (1-2 hours)      Critically ill patients:  140 - 180 mg/dL   Lab Results  Component Value Date   GLUCAP 242 (H) 06/04/2016   HGBA1C 7.6 05/22/2016    Review of Glycemic Control:  Results for ALEEHA, LOGALBO (MRN [id number]) as of 06/04/2016 15:44  Ref. Range 06/03/2016 07:37 06/03/2016 11:16 06/03/2016 16:27 06/03/2016 21:29 06/04/2016 07:37 06/04/2016 11:47  Glucose-Capillary Latest Ref Range: 65 - 99 mg/dL 190 (H) 282 (H) 213 (H) 165 (H) 199 (H) 242 (H)   Current orders: Levemir 10 units q HS, Novolog moderate tid with meals and HS  Reviewed last visit @ Dr. Loanne Drilling 05/22/16: Basal rate = 0.8 units/hr= 19.2/24 hrs. Humalog bolus=10 units per meal Correction -1 unit drops blood glucose 25. Insulin pump off @ present.  Recommendations:  Please increase Levemir to 8 units bid.  Also consider reducing Novolog correction to sensitive and adding Novolog meal coverage 3 units tid with meals.   Thanks, Adah Perl, RN, BC-ADM Inpatient Diabetes Coordinator Pager [phone] (8a-5p)

## 2016-06-04 NOTE — Progress Notes (Signed)
2qPatient ID: Emma Yu, female   DOB: [birth year], 82 y.o.   MRN: [id number]    PROGRESS NOTE    ALEIA MORROBEL  [id number] DOB: 28-Dec-1926 DOA: 05/30/2016  PCP: [name], MD   Cardiologist: Dr. Johnsie Cancel   Brief Narrative:  82 y.o. female with medical history significant of CHF, HTN, HLD, CAD, A. fib, DM type II; who presented to [facility] ED with main concern of 1-2 days duration of progressively worsening dyspnea initially present with exertion and has progressed to dyspnea at rest in the past 24 hours prior to this admission. This has been associated with non productive cough, fatigue and several pounds weight gain despite poor oral intake.  Assessment & Plan:   Principal Problem:   Acute respiratory failure with hypoxia, mild troponin elevation  - suspect this is due to acute diastolic CHF exacerbation but underlying PNA can not be entirely ruled out - CT chest was somewhat concerning for bibasilar PNA vs edema - pt did have low grade fevers 99.6 F overnight 10/12 - 10/13, we added Levaquin empirically 10/13, will continue same regimen for now - pt is currently on lasix PO and weight trend since admission: 149 --> 150 --> 145 --> 142 --> 138 lbs this AM  - ECHO was already done, last one done in January 2017 with EF 70% - appreciate cardiology team assistance  - pt more comfortable this AM, OK to transfer to tele unit  - pt wants to go home but I suspect she will be OK for tomorrow   Active Problems:   Essential hypertension, benign - continue Norvasc and Metoprolol, lasix     Chronic renal insufficiency, stage III (moderate) - review of records indicate Cr at baseline ~ 1.4 - 1.7 - it appears that Cr remains at pt's baseline - BMP in AM    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   Objective: Vitals:   06/03/16 2013 06/04/16 0002 06/04/16 0300 06/04/16 0729  BP: (!) 155/80 136/64  (!) 162/65  Pulse: 86 74 79 81  Resp: (!) 169 19 (!) 21 (!) 25  Temp: 98.7 F (37.1 C) 98.2 F (36.8 C)  98 F (36.7 C)  TempSrc: Oral Oral  Oral  SpO2: 94% 93% (!) 88% 91%  Weight:    62.7 kg (138 lb 3.7 oz)  Height:        Intake/Output Summary (Last 24 hours) at 06/04/16 1133 Last data filed at 06/03/16 1800  Gross per 24 hour  Intake              210 ml  Output              800 ml  Net             -590 ml   Filed Weights   06/02/16 0240 06/03/16 0500 06/04/16 0729  Weight: 64.4 kg (  141 lb 15.6 oz) 64.4 kg (142 lb) 62.7 kg (138 lb 3.7 oz)    Examination:  General exam: Appears calm, on venti mask Respiratory system: still diminished breath sounds at bases with very minimal bilateral crackles  Cardiovascular system: S1 & S2 heard, RRR. No murmurs, rubs, gallops or clicks. . Gastrointestinal system: Abdomen is nondistended, soft and nontender. No organomegaly or masses felt. Normal bowel sounds heard. Central nervous system: Alert and oriented. No focal neurological deficits. Extremities: Symmetric 5 x 5 power, bilateral LE venous stasis changes    Data Reviewed: I have personally reviewed following labs and imaging studies  CBC:  Recent Labs Lab 05/30/16 1823 05/30/16 1835 05/31/16 0549 06/01/16 0215 06/02/16 0240 06/03/16 0309  WBC 9.7  --  10.1 8.9 9.0 7.5  NEUTROABS  --   --  8.3*  --   --   --   HGB 13.2 14.6 13.1 12.1 13.1 13.5  HCT 41.5 43.0 41.3 38.7 40.8 41.8  MCV 101.7*  --  102.0*  101.6* 99.5 98.6  PLT 220  --  193 203 216 [id number]   Basic Metabolic Panel:  Recent Labs Lab 06/01/16 0215 06/02/16 0240 06/03/16 0309 06/03/16 1051 06/04/16 0232  NA 139 137 137 138 138  K 4.3 4.1 5.5* 3.8 3.7  CL 98* 96* 98* 94* 98*  CO2 32 31 29 32 29  GLUCOSE 155* 186* 161* 289* 195*  BUN 35* 37* 42* 43* 46*  CREATININE 1.30* 1.51* 1.48* 1.59* 1.51*  CALCIUM 9.7 10.2 10.1 10.5* 10.6*   Cardiac Enzymes:  Recent Labs Lab 05/30/16 2348 05/31/16 0549  TROPONINI <0.03 0.05*   CBG:  Recent Labs Lab 06/03/16 0737 06/03/16 1116 06/03/16 1627 06/03/16 2129 06/04/16 0737  GLUCAP 190* 282* 213* 165* 199*   Recent Results (from the past 240 hour(s))  MRSA PCR Screening     Status: None   Collection Time: 05/31/16 12:08 AM  Result Value Ref Range Status   MRSA by PCR NEGATIVE NEGATIVE Final    Comment:        The GeneXpert MRSA Assay (FDA approved for NASAL specimens only), is one component of a comprehensive MRSA colonization surveillance program. It is not intended to diagnose MRSA infection nor to guide or monitor treatment for MRSA infections.       Radiology Studies: No results found.    Scheduled Meds: . acidophilus  1 capsule Oral Q2200  . amLODipine  2.5 mg Oral Daily  . [START ON 06/05/2016] amLODipine  7.5 mg Oral Daily  . aspirin EC  81 mg Oral BID  . enoxaparin (LOVENOX) injection  30 mg Subcutaneous Daily  . furosemide  40 mg Oral QPM  . furosemide  80 mg Oral Q breakfast  . insulin aspart  0-15 Units Subcutaneous TID WC  . insulin aspart  0-5 Units Subcutaneous QHS  . insulin detemir  10 Units Subcutaneous QHS  . levofloxacin  500 mg Oral Q48H  . metoprolol  100 mg Oral BID  . pantoprazole  40 mg Oral Daily  . pravastatin  20 mg Oral QPM  . sodium chloride flush  3 mL Intravenous Q12H   Continuous Infusions:    LOS: 5 days    Time spent: 20 minutes    Faye Ramsay, MD [facility] Pager [phone]  If  7PM-7AM, please contact night-coverage [email or web address] Password [email or web address] 06/04/2016, 11:33 AM

## 2016-06-04 NOTE — Progress Notes (Signed)
Physical Therapy Treatment Patient Details Name: NAHAL TRAUTMAN MRN: [id number] DOB: 1926-09-24 Today's Date: 06/04/2016    History of Present Illness 82 y.o.femalewith medical history significant of CHF, HTN, HLD, CAD, A. fib, DM type II; who presents with progressively worsening shortness breath. In route with EMS the patient was noted to have O2 saturations as low as 70s in the field for which she was placed on a nonrebreather mask at 15 liter. Upon admission into the emergency department patient was switched to a BiPAP mask with improvement of respiratory status. CHF exacerbation vs pna (bil pulmonary effusions). Pt reporting significant Rt hip pain with mobility and palpation    PT Comments    Noted Social Worker's earlier discussion with patient and her son. Patient required increased assistance compared to 10/15 due to confusion and increased Rt hip pain. After assisting patient bed to chair, again discussed discharge plan. She admitted she went to [facility] previously for 20 days. Educated re: high fall risk and breaking her hip, "I know, I don't want that!" Patient then agreeable to SNF for therapy prior to discharge home. Repeated the discussion and confirmed at least 3 times that she was agreeable to SNF. Made Social Worker aware.    Follow Up Recommendations  SNF;Supervision for mobility/OOB     Equipment Recommendations  None recommended by PT    Recommendations for Other Services OT consult     Precautions / Restrictions Precautions Precautions: Fall Precaution Comments: reports last fell 5 mos ago; fell asleep reading the paper and fell from chair    Mobility  Bed Mobility Overal bed mobility: Needs Assistance Bed Mobility: Supine to Sit     Supine to sit: Mod assist;HOB elevated     General bed mobility comments: incr assist with raising torso and scoot to EOB due to Rt hip pain  Transfers Overall transfer level: Needs assistance Equipment  used: Rolling walker (2 wheeled) Transfers: Sit to/from [facility] Sit to Stand: Mod assist Stand pivot transfers: Mod assist       General transfer comment: incr time and effort to come to stand; patient with posterior and Rt lean as she began to pivot  Ambulation/Gait             General Gait Details: deferred due to decr balance and need for 2 person assist to safely walk   Stairs            Wheelchair Mobility    Modified Rankin (Stroke Patients Only)       Balance Overall balance assessment: Needs assistance;History of Falls Sitting-balance support: Bilateral upper extremity supported;Feet unsupported Sitting balance-Leahy Scale: Poor Sitting balance - Comments: posterior and Lt lean due to rt hip pain Postural control: Posterior lean Standing balance support: Bilateral upper extremity supported Standing balance-Leahy Scale: Poor                      Cognition Arousal/Alertness: Awake/alert Behavior During Therapy: WFL for tasks assessed/performed Overall Cognitive Status: Impaired/Different from baseline Area of Impairment: Orientation;Safety/judgement;Awareness Orientation Level: Place;Time;Situation       Safety/Judgement: Decreased awareness of safety;Decreased awareness of deficits     General Comments: RN reports overnight increasing confusion. Improving as the day progresses.     Exercises General Exercises - Lower Extremity Ankle Circles/Pumps: AROM;Both;10 reps (x2 sets) Long Arc Quad: AROM;Both;10 reps Heel Slides: AROM;Both;5 reps;Strengthening    General Comments        Pertinent Vitals/Pain BP supine 148/74 HR  74 BP after transfer 118/80 HR 92 BP after reclined with exercises 139/99 HR 73  Pain Assessment: Faces Faces Pain Scale: Hurts whole lot Pain Location: Rt hip Pain Descriptors / Indicators: Grimacing;Guarding;Sharp Pain Intervention(s): Limited activity within patient's tolerance;Monitored  during session;Repositioned    Home Living                      Prior Function            PT Goals (current goals can now be found in the care plan section) Acute Rehab PT Goals Patient Stated Goal: go home Time For Goal Achievement: 06/09/16 Progress towards PT goals: Not progressing toward goals - comment (incr confusion and pain )    Frequency    Min 3X/week      PT Plan Current plan remains appropriate    Co-evaluation             End of Session Equipment Utilized During Treatment: Gait belt;Oxygen Activity Tolerance: Treatment limited secondary to medical complications (Comment) (decr BP with stand-pivot (asymptomatic)) Patient left: in chair;with call bell/phone within reach;with chair alarm set;with nursing/sitter in room     Time: [id number] PT Time Calculation (min) (ACUTE ONLY): 31 min  Charges:  $Therapeutic Exercise: 8-22 mins $Therapeutic Activity: 8-22 mins                    G Codes:      Colonel Krauser Jun 13, 2016, 12:41 PM  Pager [phone]

## 2016-06-04 NOTE — Progress Notes (Signed)
CSW spoke with patient and patient's son regarding discharge planning. Patient stated she did not want to go to SNF. CSW also spoke with patient's son. He stated that he was worried for his mom going home, but that he would not make her go against her will. He has a medical alert button for her if she will wear it.  CSW signing off. Please re-consult if need arises.  Percell Locus Emma Yu LCSWA [phone]

## 2016-06-04 NOTE — Clinical Social Work Note (Signed)
Clinical Social Work Assessment  Patient Details  Name: Emma Yu MRN: [id number] Date of Birth: 02-Mar-1927  Date of referral:  06/04/16               Reason for consult:  Facility Placement                Permission sought to share information with:  Family Supports, Customer service manager Permission granted to share information::  Yes, Verbal Permission Granted  Name::     Teacher, music::  SNFs  Relationship::  Son  Contact Information:  [phone]  Housing/Transportation Living arrangements for the past 2 months:  [place] of Information:  Adult Children, Patient Patient Interpreter Needed:  None Criminal Activity/Legal Involvement Pertinent to Current Situation/Hospitalization:  No - Comment as needed Significant Relationships:  Adult Children Lives with:  Adult Children Do you feel safe going back to the place where you live?  No Need for family participation in patient care:  Yes (Comment)  Care giving concerns:  CSW received consult for possible SNF placement at time of discharge. CSW met with patient and patient's son regarding PT recommendation of SNF placement at time of discharge. Per patient's son, patient's son works and is currently unable to care for patient at their home given patient's current physical needs and fall risk. Patient and patient's son expressed understanding of PT recommendation and may be agreeable to SNF placement at time of discharge. Patient keeps changing her mind on snf decision (likely due to confusion). CSW to continue to follow and assist with discharge planning needs.   Social Worker assessment / plan:  CSW spoke with patient and patient's son concerning possibility of rehab at [facility] before returning home.  Employment status:  Retired Nurse, adult PT Recommendations:  Emigrant / Referral to community resources:  [place]  Patient/Family's Response to care:  Patient and patient's son recognize need for rehab before returning home and are agreeable to a SNF in [place]. Patient reported preference for [facility].  Patient/Family's Understanding of and Emotional Response to Diagnosis, Current Treatment, and Prognosis:  Patient/family is realistic regarding therapy needs and expressed being hopeful for SNF placement. Patient expressed understanding of CSW role and discharge process. No questions/concerns about plan or treatment.    Emotional Assessment Appearance:  Appears stated age Attitude/Demeanor/Rapport:  Other (Appropriate) Affect (typically observed):  Accepting, Appropriate (Easily confused) Orientation:  Oriented to Self Alcohol / Substance use:  Not Applicable Psych involvement (Current and /or in the community):  No (Comment)  Discharge Needs  Concerns to be addressed:  Care Coordination Readmission within the last 30 days:  No Current discharge risk:  None Barriers to Discharge:  Continued Medical Work up   [employer], [place] 06/04/2016, 1:46 PM

## 2016-06-04 NOTE — Progress Notes (Signed)
Patient Name: Emma Yu Date of Encounter: 06/04/2016  Primary Cardiologist: Dr. Debbe Odea Problem List     Principal Problem:   Acute respiratory failure [facility]) Active Problems:   Hyperlipidemia with target LDL less than 70   Essential hypertension, benign   Chronic renal insufficiency, stage III (moderate)   Chronic atrial fibrillation (HCC)   CHF exacerbation (HCC)   Diabetes mellitus type 2 in nonobese (HCC)   Acute on chronic congestive heart failure ([place])   Dyspnea     Subjective   Feels well.  Wants to go home.  Denies chest pain or shortness of breath.   Inpatient Medications    Scheduled Meds: . acidophilus  1 capsule Oral Q2200  . amLODipine  5 mg Oral Daily  . aspirin EC  81 mg Oral BID  . enoxaparin (LOVENOX) injection  30 mg Subcutaneous Daily  . furosemide  40 mg Oral QPM  . furosemide  80 mg Oral Q breakfast  . insulin aspart  0-15 Units Subcutaneous TID WC  . insulin aspart  0-5 Units Subcutaneous QHS  . insulin detemir  10 Units Subcutaneous QHS  . levofloxacin  500 mg Oral Q48H  . metoprolol  100 mg Oral BID  . pantoprazole  40 mg Oral Daily  . pravastatin  20 mg Oral QPM  . sodium chloride flush  3 mL Intravenous Q12H   Continuous Infusions:   PRN Meds: sodium chloride, acetaminophen, acetaminophen, ondansetron (ZOFRAN) IV, sodium chloride flush, traMADol   Vital Signs    Vitals:   06/03/16 2013 06/04/16 0002 06/04/16 0300 06/04/16 0729  BP: (!) 155/80 136/64  (!) 162/65  Pulse: 86 74 79 81  Resp: (!) 169 19 (!) 21 (!) 25  Temp: 98.7 F (37.1 C) 98.2 F (36.8 C)  98 F (36.7 C)  TempSrc: Oral Oral  Oral  SpO2: 94% 93% (!) 88% 91%  Weight:    138 lb 3.7 oz (62.7 kg)  Height:        Intake/Output Summary (Last 24 hours) at 06/04/16 1036 Last data filed at 06/03/16 1800  Gross per 24 hour  Intake              330 ml  Output              800 ml  Net             -470 ml   Filed Weights   06/02/16 0240 06/03/16  0500 06/04/16 0729  Weight: 141 lb 15.6 oz (64.4 kg) 142 lb (64.4 kg) 138 lb 3.7 oz (62.7 kg)    Physical Exam    GEN: Well nourished, well developed, in no acute distress.  HEENT: Grossly normal.  Neck: Supple, no JVD, carotid bruits, or masses. Cardiac: Irregularly irregular, no murmurs, rubs, or gallops. No clubbing, cyanosis, edema.  Radials/DP/PT 2+ and equal bilaterally.  Respiratory:  Respirations regular and unlabored, clear to auscultation bilaterally. GI: Soft, nontender, nondistended, BS + x 4. MS: no deformity or atrophy. Skin: warm and dry, no rash. Neuro:  Strength and sensation are intact. Psych: AAOx3.  Normal affect.  Labs    CBC  Recent Labs  06/02/16 0240 06/03/16 0309  WBC 9.0 7.5  HGB 13.1 13.5  HCT 40.8 41.8  MCV 99.5 98.6  PLT 216 [id number]   Basic Metabolic Panel  Recent Labs  06/03/16 1051 06/04/16 0232  NA 138 138  K 3.8 3.7  CL 94* 98*  CO2 32 29  GLUCOSE 289* 195*  BUN 43* 46*  CREATININE 1.59* 1.51*  CALCIUM 10.5* 10.6*   Liver Function Tests No results for input(s): AST, ALT, ALKPHOS, BILITOT, PROT, ALBUMIN in the last 72 hours. No results for input(s): LIPASE, AMYLASE in the last 72 hours. Cardiac Enzymes No results for input(s): CKTOTAL, CKMB, CKMBINDEX, TROPONINI in the last 72 hours. BNP Invalid input(s): POCBNP D-Dimer No results for input(s): DDIMER in the last 72 hours. Hemoglobin A1C No results for input(s): HGBA1C in the last 72 hours. Fasting Lipid Panel No results for input(s): CHOL, HDL, LDLCALC, TRIG, CHOLHDL, LDLDIRECT in the last 72 hours. Thyroid Function Tests No results for input(s): TSH, T4TOTAL, T3FREE, THYROIDAB in the last 72 hours.  Invalid input(s): FREET3  Telemetry    Atial fibrillation rates <100 bpm - Personally Reviewed  ECG     05/30/16: Atrial fibrillation.  Rate - Personally Reviewed  Radiology    No results found.  Cardiac Studies   Echo (10/17) with EF 65-70%, mild AI, mild mitral  stenosis.   Patient Profile     [phone] with chronic diastolic heart failure, hypertension, hyperlipidemia, CAD, and diabetes admitted with exertional dyspnea and mildly elevated troponin thought to be due to acute diastolic heart failure and possible underlying pneumonia.  Assessment & Plan    # Acute hypoxic respiratory failure: # Acute on chronic diastolic heart failure: # Demand ischemia: Thought to be due to acute on chronic diastolic heart failure. Weight is down to 138 and she was switched from IV diuretics to oral yesterday. She was 149 on admission.  She appears to be euvolemic on exam today. [name] currently on Lasix 80 in the morning and 40 in the evenings.  Net negative 253mL yesterday, which is reasonable.  She continues to require supplemental oxygen, down to 1L today.  [name] not on oxygen at home. [name] also being treated for pneumonia with levofloxacin. No indication for ischemia evaluation at this point.  Continue aspirin and metoprolol.   # Hypertensive heart disease: Blood pressure is poorly-controlled.  Increase amlodipine to 7.5mg  and continue metoprolol.   # Permanent atrial fibrillation: Rates are well-controlled. She has not been on anticoagulation due to falls.   # Acute on chronic kidney disease: IV Lasix was stopped 06/03/16.  BMP pending for today.   # Hyperlipidemia: Continue pravastatin 20 mg daily.   Signed, Skeet Latch, MD  06/04/2016, 10:36 AM

## 2016-06-04 NOTE — Care Management Note (Signed)
Case Management Note  Patient Details  Name: Emma Yu MRN: [id number] Date of Birth: 1927-07-26  Subjective/Objective:    Adm w resp failure                Action/Plan: lives w son who works   Expected Discharge Date:                  Expected Discharge Plan:     In-House Referral:  Clinical Social Work  Discharge planning Services  CM Consult  Post Acute Care Choice:    Choice offered to:     DME Arranged:    DME Agency:     HH Arranged:    [place] Agency:     Status of Service:  In process, will continue to follow  If discussed at Long Length of Stay Meetings, dates discussed:    Additional Comments:req 2 to get up, phy there rec snf, sw ref placed.  Lacretia Leigh, RN 06/04/2016, 10:55 AM

## 2016-06-04 NOTE — Progress Notes (Signed)
CSW received call from PT stating that patient is now agreeable to SNF and that she had been to [facility] before. CSW will send out referral to snfs.    Percell Locus Sony Schlarb LCSWA [phone]

## 2016-06-05 DIAGNOSIS — I5031 Acute diastolic (congestive) heart failure: Secondary | ICD-10-CM | POA: Diagnosis not present

## 2016-06-05 DIAGNOSIS — E119 Type 2 diabetes mellitus without complications: Secondary | ICD-10-CM | POA: Diagnosis not present

## 2016-06-05 DIAGNOSIS — M25551 Pain in right hip: Secondary | ICD-10-CM | POA: Diagnosis not present

## 2016-06-05 DIAGNOSIS — R279 Unspecified lack of coordination: Secondary | ICD-10-CM | POA: Diagnosis not present

## 2016-06-05 DIAGNOSIS — R2681 Unsteadiness on feet: Secondary | ICD-10-CM | POA: Diagnosis not present

## 2016-06-05 DIAGNOSIS — R7989 Other specified abnormal findings of blood chemistry: Secondary | ICD-10-CM | POA: Diagnosis not present

## 2016-06-05 DIAGNOSIS — I10 Essential (primary) hypertension: Secondary | ICD-10-CM | POA: Diagnosis not present

## 2016-06-05 DIAGNOSIS — R0602 Shortness of breath: Secondary | ICD-10-CM | POA: Diagnosis not present

## 2016-06-05 DIAGNOSIS — J9600 Acute respiratory failure, unspecified whether with hypoxia or hypercapnia: Secondary | ICD-10-CM | POA: Diagnosis not present

## 2016-06-05 DIAGNOSIS — J9601 Acute respiratory failure with hypoxia: Secondary | ICD-10-CM | POA: Diagnosis not present

## 2016-06-05 DIAGNOSIS — N183 Chronic kidney disease, stage 3 (moderate): Secondary | ICD-10-CM | POA: Diagnosis not present

## 2016-06-05 DIAGNOSIS — M6281 Muscle weakness (generalized): Secondary | ICD-10-CM | POA: Diagnosis not present

## 2016-06-05 LAB — GLUCOSE, CAPILLARY
Glucose-Capillary: 236 mg/dL — ABNORMAL HIGH (ref 65–99)
Glucose-Capillary: 334 mg/dL — ABNORMAL HIGH (ref 65–99)

## 2016-06-05 MED ORDER — GABAPENTIN 100 MG PO CAPS
200.0000 mg | ORAL_CAPSULE | Freq: Every day | ORAL | Status: DC
Start: 2016-06-05 — End: 2016-10-17

## 2016-06-05 MED ORDER — LEVOFLOXACIN 500 MG PO TABS: 500.0000 mg | ORAL_TABLET | ORAL | 0 refills | Status: DC

## 2016-06-05 MED ORDER — FUROSEMIDE 40 MG PO TABS: 40.0000 mg | ORAL_TABLET | Freq: Every evening | ORAL | 0 refills | Status: DC

## 2016-06-05 NOTE — Clinical Social Work Placement (Signed)
   CLINICAL SOCIAL WORK PLACEMENT  NOTE  Date:  06/05/2016  Patient Details  Name: Emma Yu MRN: [id number] Date of Birth: Dec 30, 1926  Clinical Social Work is seeking post-discharge placement for this patient at the [place] level of care (*CSW will initial, date and re-position this form in  chart as items are completed):      Patient/family provided with [place] Work Department's list of facilities offering this level of care within the geographic area requested by the patient (or if unable, by the patient's family).      Patient/family informed of their freedom to choose among providers that offer the needed level of care, that participate in Medicare, Medicaid or managed care program needed by the patient, have an available bed and are willing to accept the patient.      Patient/family informed of [place]'s ownership interest in [facility] and [facility], as well as of the fact that they are under no obligation to receive care at these facilities.  PASRR submitted to EDS on       PASRR number received on       Existing PASRR number confirmed on 06/04/16     FL2 transmitted to all facilities in geographic area requested by pt/family on 06/04/16     FL2 transmitted to all facilities within larger geographic area on       Patient informed that his/her managed care company has contracts with or will negotiate with certain facilities, including the following:        Yes   Patient/family informed of bed offers received.  Patient chooses bed at [place], [place]     Physician recommends and patient chooses bed at      Patient to be transferred to [place] on 06/05/16.  Patient to be transferred to facility by Car: Son     Patient family notified on 06/05/16 of transfer.  Name of family member notified:  [name]     PHYSICIAN Please prepare prescriptions     Additional Comment:     _______________________________________________ Candie Chroman, LCSW 06/05/2016, 2:54 PM

## 2016-06-05 NOTE — Progress Notes (Signed)
Physical Therapy Treatment Patient Details Name: Emma Yu MRN: [id number] DOB: 11-28-1926 Today's Date: 06/05/2016    History of Present Illness 82 y.o.femalewith medical history significant of CHF, HTN, HLD, CAD, A. fib, DM type II; who presents with progressively worsening shortness breath. In route with EMS the patient was noted to have O2 saturations as low as 70s in the field for which she was placed on a nonrebreather mask at 15 liter. Upon admission into the emergency department patient was switched to a BiPAP mask with improvement of respiratory status. CHF exacerbation vs pna (bil pulmonary effusions). Pt reporting significant Rt hip pain with mobility and palpation    PT Comments    Pt admitted with above diagnosis. Pt currently with functional limitations due to balance and endurance deficits. Pt was able to ambulate with RW with +2 min assist with chair follow.  Agrees to SNF.  Desats without O2.   Pt will benefit from skilled PT to increase their independence and safety with mobility to allow discharge to the venue listed below.    Follow Up Recommendations  SNF;Supervision for mobility/OOB     Equipment Recommendations  None recommended by PT    Recommendations for Other Services OT consult     Precautions / Restrictions Precautions Precautions: Fall Precaution Comments: reports last fell 5 mos ago; fell asleep reading the paper and fell from chair Restrictions Weight Bearing Restrictions: No    Mobility  Bed Mobility Overal bed mobility: Needs Assistance Bed Mobility: Supine to Sit     Supine to sit: HOB elevated;Min assist     General bed mobility comments: incr assist with raising torso and scoot to EOB  Transfers Overall transfer level: Needs assistance Equipment used: Rolling walker (2 wheeled) Transfers: Sit to/from Stand Sit to Stand: Mod assist;+2 safety/equipment         General transfer comment: incr time and effort to come to  stand; patient with posterior lean and flexed posture.   Ambulation/Gait Ambulation/Gait assistance: Min assist;+2 safety/equipment Ambulation Distance (Feet): 12 Feet Assistive device: Rolling walker (2 wheeled) Gait Pattern/deviations: Step-through pattern;Decreased stride length;Decreased step length - right;Decreased step length - left;Drifts right/left;Trunk flexed;Wide base of support;Leaning posteriorly   Gait velocity interpretation: Below normal speed for age/gender General Gait Details: Pt was able to walk towards door.  Chair follow and had to bring chair to pt as she was DOE 3/4.  Poor posture overall.    Stairs            Wheelchair Mobility    Modified Rankin (Stroke Patients Only)       Balance Overall balance assessment: Needs assistance Sitting-balance support: Bilateral upper extremity supported;Feet supported Sitting balance-Leahy Scale: Poor Sitting balance - Comments: posterior Postural control: Posterior lean Standing balance support: Bilateral upper extremity supported;During functional activity Standing balance-Leahy Scale: Poor Standing balance comment: relies on RW for balance.                     Cognition Arousal/Alertness: Awake/alert Behavior During Therapy: WFL for tasks assessed/performed Overall Cognitive Status: Impaired/Different from baseline Area of Impairment: Orientation;Safety/judgement;Awareness Orientation Level: Place;Time;Situation       Safety/Judgement: Decreased awareness of safety;Decreased awareness of deficits     General Comments: RN reports overnight increasing confusion. Improving as the day progresses.     Exercises General Exercises - Lower Extremity Ankle Circles/Pumps: AROM;Both;10 reps (x2 sets) Long Arc Quad: AROM;Both;10 reps Heel Slides: AROM;Both;5 reps;Strengthening    General Comments  Pertinent Vitals/Pain Pain Assessment: No/denies pain  SATURATION QUALIFICATIONS: (This note  is used to comply with regulatory documentation for home oxygen)  Patient Saturations on Room Air at Rest = 84%  Patient Saturations on Room Air while Ambulating = NT due to desat on RA at rest  Patient Saturations on 2 Liters of oxygen while Ambulating = 93-96%  Please briefly explain why patient needs home oxygen:Pt desats on RA.  May need home O2  Home Living                      Prior Function            PT Goals (current goals can now be found in the care plan section) Acute Rehab PT Goals Patient Stated Goal: go home Progress towards PT goals: Progressing toward goals    Frequency    Min 3X/week      PT Plan Current plan remains appropriate    Co-evaluation             End of Session Equipment Utilized During Treatment: Gait belt;Oxygen Activity Tolerance: Patient limited by fatigue (decr BP with stand-pivot (asymptomatic)) Patient left: in chair;with call bell/phone within reach;with chair alarm set     Time: [id number] PT Time Calculation (min) (ACUTE ONLY): 20 min  Charges:  $Gait Training: 8-22 mins                    G CodesDenice Paradise June 20, 2016, 11:32 AM  [facility] [phone] (pager)

## 2016-06-05 NOTE — Clinical Social Work Note (Signed)
CSW facilitated patient discharge including contacting patient family and facility to confirm patient discharge plans. Clinical information faxed to facility and family agreeable with plan. Patient's son to transport by car. RN to call report prior to discharge [phone] Room 103B)  CSW will sign off for now as social work intervention is no longer needed. Please consult [place] again if new needs arise.  Dayton Scrape, [place]

## 2016-06-05 NOTE — Progress Notes (Signed)
SATURATION QUALIFICATIONS: (This note is used to comply with regulatory documentation for home oxygen)  Patient Saturations on Room Air at Rest = 84%  Patient Saturations on Room Air while Ambulating = NT due to desat on RA at rest  Patient Saturations on 2 Liters of oxygen while Ambulating = 93-96%  Please briefly explain why patient needs home oxygen:Pt desats on RA.  May need home O2.  Thanks.  [place] [phone] (pager)

## 2016-06-05 NOTE — Discharge Planning (Signed)
Patient Discharged to [facility] in [place] with Son to transport Report Given to Nurse  [facility]. Discharged paper Copied and sent with patient.

## 2016-06-05 NOTE — Progress Notes (Signed)
Patient Name: Emma Yu Date of Encounter: 06/05/2016  Primary Cardiologist: Dr. Debbe Odea Problem List     Principal Problem:   Acute respiratory failure [facility]) Active Problems:   Hyperlipidemia with target LDL less than 70   Essential hypertension, benign   Chronic renal insufficiency, stage III (moderate)   Chronic atrial fibrillation (HCC)   CHF exacerbation (HCC)   Diabetes mellitus type 2 in nonobese ([place])   Acute on chronic congestive heart failure ([place])   Dyspnea     Subjective   "really weak walking and Short of breath"   Inpatient Medications    Scheduled Meds: . acidophilus  1 capsule Oral Q2200  . amLODipine  7.5 mg Oral Daily  . aspirin EC  81 mg Oral BID  . enoxaparin (LOVENOX) injection  30 mg Subcutaneous Daily  . furosemide  40 mg Oral QPM  . furosemide  80 mg Oral Q breakfast  . insulin aspart  0-15 Units Subcutaneous TID WC  . insulin aspart  0-5 Units Subcutaneous QHS  . insulin detemir  10 Units Subcutaneous QHS  . levofloxacin  500 mg Oral Q48H  . metoprolol  100 mg Oral BID  . pantoprazole  40 mg Oral Daily  . pravastatin  20 mg Oral QPM  . sodium chloride flush  3 mL Intravenous Q12H   Continuous Infusions:   PRN Meds: sodium chloride, acetaminophen, acetaminophen, ondansetron (ZOFRAN) IV, sodium chloride flush, traMADol   Vital Signs    Vitals:   06/04/16 1546 06/04/16 1755 06/04/16 1953 06/05/16 0456  BP: (!) 146/69 99/70 120/65 (!) 135/59  Pulse: 96 92 95 73  Resp: (!) 34 (!) 22 18 18   Temp: 97.2 F (36.2 C) 97.1 F (36.2 C) 98.5 F (36.9 C) 98 F (36.7 C)  TempSrc: Oral Oral Oral Oral  SpO2: 95% 97% 94% 96%  Weight:  150 lb 2.1 oz (68.1 kg)  141 lb 5 oz (64.1 kg)  Height:  5\' 3"  (1.6 m)      Intake/Output Summary (Last 24 hours) at 06/05/16 1040 Last data filed at 06/05/16 1000  Gross per 24 hour  Intake              818 ml  Output              800 ml  Net               18 ml   Filed Weights   06/04/16 0729 06/04/16 1755 06/05/16 0456  Weight: 138 lb 3.7 oz (62.7 kg) 150 lb 2.1 oz (68.1 kg) 141 lb 5 oz (64.1 kg)    Physical Exam   GEN: Well nourished, well developed, in no acute distress. Sitting up in chair HEENT: Grossly normal.  Neck: Supple, no JVD Cardiac: RRR, no murmurs, rubs, or gallops. No clubbing, cyanosis, edema.  Radials 2+ and equal bilaterally.  Respiratory:  Respirations regular and unlabored, clear to auscultation bilaterally. GI: Soft, nontender, nondistended, BS + x 4. MS: no deformity or atrophy. Skin: warm and dry Neuro:  AAOx3. MAE, follows commands Psych:   Normal affect.  Labs    CBC  Recent Labs  06/03/16 0309  WBC 7.5  HGB 13.5  HCT 41.8  MCV 98.6  PLT [id number]   Basic Metabolic Panel  Recent Labs  06/03/16 1051 06/04/16 0232  NA 138 138  K 3.8 3.7  CL 94* 98*  CO2 32 29  GLUCOSE 289* 195*  BUN 43* 46*  CREATININE 1.59* 1.51*  CALCIUM 10.5* 10.6*   Liver Function Tests No results for input(s): AST, ALT, ALKPHOS, BILITOT, PROT, ALBUMIN in the last 72 hours. No results for input(s): LIPASE, AMYLASE in the last 72 hours. Cardiac Enzymes No results for input(s): CKTOTAL, CKMB, CKMBINDEX, TROPONINI in the last 72 hours. BNP Invalid input(s): POCBNP D-Dimer No results for input(s): DDIMER in the last 72 hours. Hemoglobin A1C No results for input(s): HGBA1C in the last 72 hours. Fasting Lipid Panel No results for input(s): CHOL, HDL, LDLCALC, TRIG, CHOLHDL, LDLDIRECT in the last 72 hours. Thyroid Function Tests No results for input(s): TSH, T4TOTAL, T3FREE, THYROIDAB in the last 72 hours.  Invalid input(s): FREET3  Telemetry   a fib mostly rate controlled but with ambulation  HR to 127 or so. Pt weak - Personally Reviewed  ECG    See previous notes  Radiology    PORTABLE CHEST 1 VIEW  06/01/16  COMPARISON:  05/30/2016  FINDINGS: Improvement in vascular congestion and pulmonary edema. Mild edema remains. Bibasilar  atelectasis and small bilateral effusions.  IMPRESSION: Improvement in congestive heart failure with pulmonary edema.   Cardiac Studies   Echo (10/17) with EF 65-70%, mild AI, mild mitral stenosis.   Patient Profile     [phone] with chronic diastolic heart failure, hypertension, hyperlipidemia, CAD, and diabetes admitted with exertional dyspnea and mildly elevated troponin thought to be due to acute diastolic heart failure and possible underlying pneumonia.   Assessment & Plan    Acute hypoxic respiratory failure: # Acute on chronic diastolic heart failure: # Demand ischemia: Thought to be due to acute on chronic diastolic heart failure. Weight is down to 138 yesterday today 141. and she was switched from IV diuretics to oral yesterday.  She appears to be euvolemic on exam today. [name] currently on Lasix 80 in the morning and 40 in the evenings.  Net negative 222mL yesterday, which is reasonable. Today + 78 ml (total since admit now + 288)  Wt is lower with pk of 150 and today 141 though weights have varied a great deal. She continues to require supplemental oxygen, down to 1L today.  [name] not on oxygen at home. [name] also being treated for pneumonia with levofloxacin. No indication for ischemia evaluation at this point.  Continue aspirin and metoprolol.  -plan for her to go to rehab  # Hypertensive heart disease: Blood pressure is poorly-controlled.  Increase amlodipine to 7.5mg  and continue metoprolol.   Labile BP 146/74 to 99/70 this was one of her problems at home   # Permanent atrial fibrillation: Rates are well-controlled until ambulation up to 127 but may be due to her illness and agree she needs rehab.  On lopressor 100 mg BID.  She has not been on anticoagulation due to falls.   # Acute on chronic kidney disease: IV Lasix was stopped 06/03/16.  BMP not ordered for today.  -yesterday Cr. 1.51, BUN 46.  # Hyperlipidemia: Continue pravastatin 20 mg daily.  Signed, Cecilie Kicks, NP  06/05/2016, 10:40 AM

## 2016-06-05 NOTE — Discharge Instructions (Signed)

## 2016-06-05 NOTE — Discharge Summary (Addendum)
Physician Discharge Summary  Emma Yu [id number] DOB: 1926-12-21 DOA: 05/30/2016  PCP: [name], MD  Admit date: 05/30/2016 Discharge date: 06/05/2016  Recommendations for Outpatient Follow-up:  1. Pt will need to follow up with PCP in 1-2 weeks post discharge 2. Please obtain BMP to evaluate electrolytes and kidney function 3. Please also check CBC to evaluate Hg and Hct levels 4. Please note that cardiologist has recommended lasix 80 mg tablet in am and 40 mg tablet in the evening  5. Weight on discharge 141 lbs, please continue to monitor daily weight and report to attending doctor any change of 2-3 lbs weight gain in 24 hours, worsening dyspnea or LE swelling  6. Pt on oxygen via [place] 2 L, tobe continued upon discharge   Discharge Diagnoses:  Principal Problem:   Acute respiratory failure ([place]) Active Problems:   Hyperlipidemia with target LDL less than 70   Essential hypertension, benign  Discharge Condition: Stable  Diet recommendation: Heart healthy diet discussed in details   Brief Narrative:  82 y.o.femalewith medical history significant of CHF, HTN, HLD, CAD, A. fib, DM type II; who presented to [facility] ED with main concern of 1-2 days duration of progressively worsening dyspnea initially present with exertion and has progressed to dyspnea at rest in the past 24 hours prior to this admission. This has been associated with non productive cough, fatigue and several pounds weight gain despite poor oral intake.  Assessment & Plan:   Principal Problem:   Acute respiratory failure with hypoxia, mild troponin elevation  - suspect this is due to acute diastolic CHF exacerbation but underlying PNA can not be entirely ruled out - CT chest was somewhat concerning for bibasilar PNA vs edema - pt did have low grade fevers 99.6 F overnight 10/12 - 10/13, we added Levaquin empirically 10/13, will continue same regimen for now and upon discharge to complete therapy  -  pt is currently on lasix PO and weight trend since admission: 149 --> 150 --> 145 --> 142 --> 131 lbs this AM  - ECHO was already done, last one done in January 2017 with EF 70% - appreciate cardiology team assistance  - pt more comfortable this AM - plan to discharge to SNF when bed available   Active Problems:   Essential hypertension, benign - continue Norvasc and Metoprolol, lasix     Chronic renal insufficiency, stage III (moderate) - review of records indicate Cr at baseline ~ 1.4 - 1.7 - it appears that Cr remains at pt's baseline    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  Initial encounter. EXAM: CT ANGIOGRAPHY CHEST WITH CONTRAST TECHNIQUE: Multidetector CT imaging of the chest was performed using the standard protocol during bolus administration of intravenous contrast. Multiplanar CT image reconstructions and MIPs were obtained to evaluate the vascular anatomy. CONTRAST:  80  mL of Isovue 370 IV contrast COMPARISON:  Chest radiograph performed 05/30/2016 FINDINGS: Cardiovascular: There is no evidence of significant pulmonary embolus. Evaluation for pulmonary embolus is mildly suboptimal in areas of airspace consolidation. The heart is mildly enlarged, with biatrial enlargement. Scattered calcification is noted along the aortic arch and descending thoracic aorta. The proximal  great vessels appear grossly intact, aside from scattered calcification. Mediastinum/Nodes: A mildly enlarged 1.3 cm subcarinal node is noted, of uncertain significance. Mildly prominent right paratracheal nodes are also seen. No pericardial effusion is identified. The thyroid gland contains a small 9 mm hypodensity, likely benign given its size. No axillary lymphadenopathy is seen. Lungs/Pleura: Small bilateral pleural effusions are noted. There is partial consolidation of both lower lung lobes, which may reflect pulmonary edema or possibly infection. No pneumothorax is seen. Upper Abdomen: The visualized portions of the liver and spleen are grossly unremarkable. Musculoskeletal: No acute osseous abnormalities are identified. The visualized musculature is unremarkable in appearance. Review of the MIP images confirms the above findings. IMPRESSION: 1. No evidence of significant pulmonary embolus. 2. Small bilateral pleural effusions noted. Partial consolidation of both lower lung lobes, which may reflect pulmonary edema or possibly infection. 3. Mild cardiomegaly, with biatrial enlargement. 4. Mildly enlarged mediastinal nodes, measuring up to 1.3 cm in short axis. Electronically Signed   By: Garald Balding M.D.   On: 05/31/2016 23:35   Dg Chest Port 1 View  Result Date: 06/01/2016 CLINICAL DATA:  Short of breath EXAM: PORTABLE CHEST 1 VIEW COMPARISON:  05/30/2016 FINDINGS: Improvement in vascular congestion and pulmonary edema. Mild edema remains. Bibasilar atelectasis and small bilateral effusions. IMPRESSION: Improvement in congestive heart failure with pulmonary edema. Electronically Signed   By: Franchot Gallo M.D.   On: 06/01/2016 14:55   Dg Chest Portable 1 View  Result Date: 05/30/2016 CLINICAL DATA:  Shortness of breath.  Dyspnea x1 day.  Chest pain EXAM: PORTABLE CHEST 1 VIEW COMPARISON:  10/06/2015 FINDINGS: The cardiomediastinal silhouette is slightly enlarged. There is pulmonary central  vascular congestion. Diffuse interstitial hazy opacities compatible with pulmonary edema. Small bilateral effusions. Focal opacity at the left lung base could relate to atelectasis are in infiltrates. No pneumothorax. Atherosclerosis of the aorta. IMPRESSION: 1. Cardiomegaly with central vascular congestion and mild to moderate diffuse pulmonary edema. Small bilateral effusions. 2. Focal opacity at the left lung base could relate to focal atelectasis or infiltrate. 3. Atherosclerotic vascular calcifications of the aorta Electronically Signed   By: Donavan Foil M.D.   On: 05/30/2016 18:17    Discharge Exam: Vitals:   06/05/16 0456 06/05/16 1132  BP: (!) 135/59 (!) 133/53  Pulse: 73 100  Resp: 18 18  Temp: 98 F (36.7 C) 97.9 F (36.6 C)   Vitals:   06/04/16 1755 06/04/16 1953 06/05/16 0456 06/05/16 1132  BP: 99/70 120/65 (!) 135/59 (!) 133/53  Pulse: 92 95 73 100  Resp: (!) 22 18 18 18   Temp: 97.1 F (36.2 C) 98.5 F (36.9 C) 98 F (36.7 C) 97.9 F (36.6 C)  TempSrc: Oral Oral Oral Oral  SpO2: 97% 94% 96% 98%  Weight: 68.1 kg (150 lb 2.1 oz)  64.1 kg (141 lb 5 oz)   Height: 5\' 3"  (1.6 m)       General: Pt is alert, follows commands appropriately, not in acute distress Cardiovascular: Regular rate and rhythm, S1/S2 +, no murmurs, no rubs, no gallops Respiratory: Clear to auscultation bilaterally, no wheezing, no crackles, no rhonchi Abdominal: Soft, non tender, non  distended, bowel sounds +, no guarding Extremities: no edema, no cyanosis, pulses palpable bilaterally DP and PT Neuro: Grossly nonfocal  Discharge Instructions  Discharge Instructions    Diet - low sodium heart healthy    Complete by:  As directed    Increase activity slowly    Complete by:  As directed        Medication List    STOP taking these medications   traMADol 50 MG tablet Commonly known as:  ULTRAM     TAKE these medications   acetaminophen 500 MG tablet Commonly known as:  TYLENOL Take  1,000 mg by mouth every 6 (six) hours as needed for moderate pain.   amLODipine 5 MG tablet Commonly known as:  NORVASC Take 1 tablet (5 mg total) by mouth daily.   aspirin EC 81 MG tablet Take 81 mg by mouth 2 (two) times daily.   b complex vitamins tablet Take 1 tablet by mouth daily.   fish oil-omega-3 fatty acids 1000 MG capsule Take 2 g by mouth 2 (two) times daily.   furosemide 80 MG tablet Commonly known as:  LASIX Take 80 mg by mouth daily. What changed:  Another medication with the same name was added. Make sure you understand how and when to take each.   furosemide 40 MG tablet Commonly known as:  LASIX Take 1 tablet (40 mg total) by mouth every evening. What changed:  You were already taking a medication with the same name, and this prescription was added. Make sure you understand how and when to take each.   gabapentin 100 MG capsule Commonly known as:  NEURONTIN Take 2 capsules (200 mg total) by mouth at bedtime. What changed:  See the new instructions.   insulin lispro 100 UNIT/ML injection Commonly known as:  HUMALOG Inject via insulin pump for a total of 70 units per day   Insulin Pump Syringe Reservoir Misc 1 Device by Does not apply route every 3 (three) days. [place] 2 ml   INSET INFUSION SET 23" 6MM Misc 1 Device by Does not apply route every 3 (three) days.   levofloxacin 500 MG tablet Commonly known as:  LEVAQUIN Take 1 tablet (500 mg total) by mouth every other day. Start taking on:  06/07/2016   metoprolol 100 MG tablet Commonly known as:  LOPRESSOR TAKE 1 TABLET (100 MG TOTAL) BY MOUTH 2 (TWO) TIMES DAILY.   omeprazole 40 MG capsule Commonly known as:  PRILOSEC Take 1 capsule by mouth  daily before breakfast   pravastatin 20 MG tablet Commonly known as:  PRAVACHOL TAKE 1 TABLET BY MOUTH IN THE EVENING   PROBIOTIC PO Take 1 tablet by mouth daily at 10 pm.   vitamin C 500 MG tablet Commonly known as:  ASCORBIC ACID Take 500 mg by  mouth daily.   Vitamin D3 2000 units capsule Take 2,000 Units by mouth daily.        Follow-up Information    [name], MD .   Specialty:  Internal Medicine Contact information: [address] [place] [address] [phone]            The results of significant diagnostics from this hospitalization (including imaging, microbiology, ancillary and laboratory) are listed below for reference.     Microbiology: Recent Results (from the past 240 hour(s))  MRSA PCR Screening     Status: None   Collection Time: 05/31/16 12:08 AM  Result Value Ref Range Status   MRSA by  PCR NEGATIVE NEGATIVE Final     Labs: Basic Metabolic Panel:  Recent Labs Lab 06/01/16 0215 06/02/16 0240 06/03/16 0309 06/03/16 1051 06/04/16 0232  NA 139 137 137 138 138  K 4.3 4.1 5.5* 3.8 3.7  CL 98* 96* 98* 94* 98*  CO2 32 31 29 32 29  GLUCOSE 155* 186* 161* 289* 195*  BUN 35* 37* 42* 43* 46*  CREATININE 1.30* 1.51* 1.48* 1.59* 1.51*  CALCIUM 9.7 10.2 10.1 10.5* 10.6*   CBC:  Recent Labs Lab 05/30/16 1823 05/30/16 1835 05/31/16 0549 06/01/16 0215 06/02/16 0240 06/03/16 0309  WBC 9.7  --  10.1 8.9 9.0 7.5  NEUTROABS  --   --  8.3*  --   --   --   HGB 13.2 14.6 13.1 12.1 13.1 13.5  HCT 41.5 43.0 41.3 38.7 40.8 41.8  MCV 101.7*  --  102.0* 101.6* 99.5 98.6  PLT 220  --  193 203 216 228   Cardiac Enzymes:  Recent Labs Lab 05/30/16 2348 05/31/16 0549  TROPONINI <0.03 0.05*   BNP (last 3 results)  Recent Labs  04/12/16 1221 05/30/16 1831 06/01/16 0215  BNP 305.2* 296.8* 188.0*   CBG:  Recent Labs Lab 06/04/16 0737 06/04/16 1147 06/04/16 1601 06/05/16 0715 06/05/16 1130  GLUCAP 199* 242* 192* 236* 334*   SIGNED: Time coordinating discharge: 30 minutes  MAGICK-Analilia Geddis, MD  [facility] 06/05/2016, 1:19 PM Pager [phone]  If 7PM-7AM, please contact night-coverage [email or web address] Password [email or web address]

## 2016-06-05 NOTE — Clinical Social Work Note (Signed)
CSW met with patient to confirm that she is still interested in SNF. Patient still agreeable. CSW provided bed offers. Patient accepted [facility] due to proximity to her son's work. CSW sent message to admissions coordinator at [facility] to notify her. CSW offered to call patient's son but she said it is very loud where he works and he would probably be unable to hear any phone conversation. Patient will notify him later today after work.  Dayton Scrape, [place]

## 2016-06-06 ENCOUNTER — Telehealth: Payer: Self-pay | Admitting: *Deleted

## 2016-06-06 DIAGNOSIS — J9601 Acute respiratory failure with hypoxia: Secondary | ICD-10-CM | POA: Diagnosis not present

## 2016-06-06 DIAGNOSIS — E119 Type 2 diabetes mellitus without complications: Secondary | ICD-10-CM | POA: Diagnosis not present

## 2016-06-06 NOTE — Telephone Encounter (Signed)
Transition Care Management Follow-up Telephone Call   Date discharged? 06/05/16   How have you been since you were released from the hospital? Pt states she is doing alright   Do you understand why you were in the hospital? YES   Do you understand the discharge instructions? YES   Where were you discharged to? Home   Items Reviewed:  Medications reviewed: YES  Allergies reviewed: YES  Dietary changes reviewed: YES  Referrals reviewed: No referral needed   Functional Questionnaire:   Activities of Daily Living (ADLs):   She states she are independent in the following: ambulation, bathing and hygiene, feeding, continence, grooming, toileting and dressing States she require assistance with the following: ambulation sometimes   Any transportation issues/concerns?: NO   Any patient concerns? NO   Confirmed importance and date/time of follow-up visits scheduled YES, APPT 06/13/16  Provider Appointment booked with Dr Ronnald Ramp  Confirmed with patient if condition begins to worsen call PCP or go to the ER.  Patient was given the office number and encouraged to call back with question or concerns.  : YES

## 2016-06-10 DIAGNOSIS — R7989 Other specified abnormal findings of blood chemistry: Secondary | ICD-10-CM | POA: Diagnosis not present

## 2016-06-13 ENCOUNTER — Inpatient Hospital Stay: Payer: Medicare Other | Admitting: Internal Medicine

## 2016-06-23 DIAGNOSIS — J9601 Acute respiratory failure with hypoxia: Secondary | ICD-10-CM | POA: Diagnosis not present

## 2016-06-23 DIAGNOSIS — I10 Essential (primary) hypertension: Secondary | ICD-10-CM | POA: Diagnosis not present

## 2016-06-23 DIAGNOSIS — E119 Type 2 diabetes mellitus without complications: Secondary | ICD-10-CM | POA: Diagnosis not present

## 2016-06-25 ENCOUNTER — Telehealth: Payer: Self-pay | Admitting: Emergency Medicine

## 2016-06-25 NOTE — Telephone Encounter (Addendum)
[facility] called and is wondering if you would be willing to sign verbal orders for PT and OT. Resting oxygen  88% to 91%, after gait its 83%. Do you want home health nursing CHF nursing. Please advise thanks.

## 2016-06-26 ENCOUNTER — Telehealth: Payer: Self-pay | Admitting: Cardiovascular Disease

## 2016-06-26 DIAGNOSIS — I130 Hypertensive heart and chronic kidney disease with heart failure and stage 1 through stage 4 chronic kidney disease, or unspecified chronic kidney disease: Secondary | ICD-10-CM | POA: Diagnosis not present

## 2016-06-26 DIAGNOSIS — E1122 Type 2 diabetes mellitus with diabetic chronic kidney disease: Secondary | ICD-10-CM | POA: Diagnosis not present

## 2016-06-26 DIAGNOSIS — E1151 Type 2 diabetes mellitus with diabetic peripheral angiopathy without gangrene: Secondary | ICD-10-CM | POA: Diagnosis not present

## 2016-06-26 DIAGNOSIS — I4891 Unspecified atrial fibrillation: Secondary | ICD-10-CM | POA: Diagnosis not present

## 2016-06-26 DIAGNOSIS — I2510 Atherosclerotic heart disease of native coronary artery without angina pectoris: Secondary | ICD-10-CM | POA: Diagnosis not present

## 2016-06-26 DIAGNOSIS — Z85828 Personal history of other malignant neoplasm of skin: Secondary | ICD-10-CM | POA: Diagnosis not present

## 2016-06-26 DIAGNOSIS — N183 Chronic kidney disease, stage 3 (moderate): Secondary | ICD-10-CM | POA: Diagnosis not present

## 2016-06-26 DIAGNOSIS — E1121 Type 2 diabetes mellitus with diabetic nephropathy: Secondary | ICD-10-CM | POA: Diagnosis not present

## 2016-06-26 DIAGNOSIS — I5031 Acute diastolic (congestive) heart failure: Secondary | ICD-10-CM | POA: Diagnosis not present

## 2016-06-26 DIAGNOSIS — Z87891 Personal history of nicotine dependence: Secondary | ICD-10-CM | POA: Diagnosis not present

## 2016-06-26 NOTE — Telephone Encounter (Signed)
Yes, thank you.

## 2016-06-26 NOTE — Telephone Encounter (Signed)
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 She notes she has been home since Saturday. Her lasix was reduced to 80mg  5 days a week (hold Sun & Wed) instead of daily. She's noted gradual weight gain, confirmed by [facility] RN who noted a 4.4 lb increase since Saturday. Pt has some SOB on exertion, none at rest or when supine. Also c/o fatigue. Patient states home health assessed her in home today and called in assessment to her PCP. She states O2 level was just reported to her as "low" but she was not given values.  Pt lives in [place]. I asked her if she was amenable to coming in for PA visit and she was. She will see Almyra Deforest tomorrow AM.  I advised to do an extra 1/2 dose of her lasix this afternoon (40mg ). Advised her to take 80mg  tomorrow (Wed) w her regular meds, though technically Wednesdays and Sundays are her "off" days for lasix. Plan on further instruction by PA at visit. Pt aware to call back today if new concerns. She voiced understanding and thanks.

## 2016-06-26 NOTE — Telephone Encounter (Signed)
Called and informed that PCP would sign verbal orders for PT and OT

## 2016-06-26 NOTE — Telephone Encounter (Signed)
New message  Sonia Baller from [facility], [phone], ext: [phone] is calling for someone to follow up w/pt  Was in hospital until 10/17, was just released from rehab on 11/4  Experiencing a 4.4 weight gain in a couple of days and swelling in feet  Furosemide was reduced to 5 days/week instead of daily  Home health says oxygen level was low, readings were not provided  Please contact patient

## 2016-06-26 NOTE — Telephone Encounter (Signed)
Forwarding to PCP as an FYI.:  Spoke to [facility] while Roselyn Reef was on the phone with them. PT stated that they will evaluate. PT wanted to know if pt would benefit from a CHF RN to evaluate any other needs (due to pt recent hospital visit). He (PT) stated that pt O2 was 90% at rest and 83% after walking. I gave verbal okay for RN to evaluate pt and to eval for O2 therapy.

## 2016-06-27 ENCOUNTER — Ambulatory Visit (INDEPENDENT_AMBULATORY_CARE_PROVIDER_SITE_OTHER): Payer: Medicare Other | Admitting: Physician Assistant

## 2016-06-27 ENCOUNTER — Encounter: Payer: Self-pay | Admitting: Physician Assistant

## 2016-06-27 VITALS — BP 144/61 | HR 71 | Ht 64.0 in | Wt 151.6 lb

## 2016-06-27 DIAGNOSIS — N183 Chronic kidney disease, stage 3 unspecified: Secondary | ICD-10-CM

## 2016-06-27 DIAGNOSIS — E785 Hyperlipidemia, unspecified: Secondary | ICD-10-CM

## 2016-06-27 DIAGNOSIS — I482 Chronic atrial fibrillation, unspecified: Secondary | ICD-10-CM

## 2016-06-27 DIAGNOSIS — I739 Peripheral vascular disease, unspecified: Secondary | ICD-10-CM | POA: Diagnosis not present

## 2016-06-27 DIAGNOSIS — IMO0001 Reserved for inherently not codable concepts without codable children: Secondary | ICD-10-CM

## 2016-06-27 DIAGNOSIS — I5033 Acute on chronic diastolic (congestive) heart failure: Secondary | ICD-10-CM | POA: Diagnosis not present

## 2016-06-27 DIAGNOSIS — E119 Type 2 diabetes mellitus without complications: Secondary | ICD-10-CM

## 2016-06-27 DIAGNOSIS — I10 Essential (primary) hypertension: Secondary | ICD-10-CM

## 2016-06-27 DIAGNOSIS — Z794 Long term (current) use of insulin: Secondary | ICD-10-CM

## 2016-06-27 MED ORDER — FUROSEMIDE 80 MG PO TABS: 80.0000 mg | ORAL_TABLET | Freq: Every day | ORAL | 9 refills | Status: DC

## 2016-06-27 MED ORDER — FUROSEMIDE 40 MG PO TABS: 40.0000 mg | ORAL_TABLET | ORAL | 6 refills | Status: DC

## 2016-06-27 NOTE — Telephone Encounter (Signed)
Thank you, I have seen her and made necessary adjustment of medication.

## 2016-06-27 NOTE — Progress Notes (Signed)
Cardiology Office Note    Date:  06/27/2016   ID:  REYA SCHWEDER, DOB 05-19-1927, MRN [id number]  PCP:  [name], MD  Cardiologist:  Dr. Fletcher [name]  For [place] and Dr. Johnsie Cancel for cardiology   Chief Complaint  Patient presents with  . Hospitalization Follow-up    seen for Dr. Johnsie Cancel    History of Present Illness:  Emma Yu is a 82 y.o. female with PMH of chronic atrial fibrillation not a candidate for systemic anticoagulation due to recurrent falls, chronic diastolic heart failure, CKD stage III with Cr 1.4-1.7, PAD s/p bilateral common iliac artery stenting in 2002 with known significant right SFA disease, diabetes, hypertension, hyperlipidemia. Last cardiac catheterization on 01/10/2011 showed luminal irregularities. Prior echocardiogram obtained in January 2017 showed a normal ejection fraction, mild aortic insufficiency, calcified aortic valve without significant stenosis  She was last seen in the cardiology office on 05/02/2016 for evaluation of shortness of breath and also chest pressure. Her Lasix was increased for 2 days which helped with the shortness of breath, however the symptoms recurred and weight increased again. Her Lasix was again increased to 80 mg twice a day for 3 days, however creatinine trended up to 2.18. Her Diovan was stopped. She was continued on 80 mg Lasix daily. Due to hyperkalemia, her potassium supplement was stopped on top of the Diovan. 3 month cardiology follow-up with Dr. Johnsie Cancel was recommended. Patient was admitted again to the hospital on 05/30/2016 with shortness of breath and increased weight gain. She was diuresed with IV Lasix 80 mg twice a day. CTA of the chest obtained on 10/12 was negative for PE, small bilateral pleural effusion was noted, mild cardiomegaly, mildly enlarged mediastinotomy nodes measuring up to 1.3 cm. Repeat echocardiogram obtained on 10/12 showed EF 65-70%, mild AS. She was eventually discharged on home Lasix 80 mg daily in  a.m. and 40 mg daily in p.m. Her discharge weight was 141 pounds. She was also discharged on home O2.  Patient presents for cardiology office visit. [name] somewhat frustrated as she originally thought she was going to see Dr. Fletcher [name]. She also has a follow-up scheduled with Dr. Johnsie Cancel as well next month. [name] not entirely sure of the role between Dr. Fletcher [name] and Dr. Johnsie Cancel and who is her primary cardiologist. For some reason she is on an even lower dose of Lasix now compared to before when she went to the hospital. [name] currently on 80 mg Lasix daily except for Wednesday and Sunday. I talked with [facility] in [place]. Apparently her creatinine trended up to 1.6 prompting them to decrease her Lasix initially to Monday, Tuesday, Thursday and Friday. Then she began to accumulate fluid back and her weight increases by more than 4 pounds, later her Lasix was changed to Monday, Wednesday, Thursday, Friday, and Saturday. She presents today stating that she has been having worsening dyspnea on exertion. She has mild abdominal distention, and at least 2-3+ pitting edema in the lower extremity. She also has gray discoloration in bilateral lower extremity, surprisingly, she does not have any pain in the legs at rest. [name] due for lower extremity arterial Doppler, we will order that. As for her fluid accumulation, I have put her back on 80 mg daily of Lasix with alternating dose of 40 mg p.m. dose Lasix every other day. I will see the patient back in 7-10 days for reassessment of her fluid level. We will keep the appointment with Dr.  Nishan at this point. I also plan to obtain a basic metabolic panel on follow-up.   Past Medical History:  Diagnosis Date  . Acute respiratory failure ([place])    12/2011 admission - decreased O2 sats on ambulation, improved by time of discharge  . Aortic stenosis    Moderate by echo 4/13 - mean 20 mmHg  . Arthritis    "back" (08/24/2015)  . Atrial fibrillation ([place])      not a coumadin candidate secondary to fall risk  . CAD (coronary artery disease)    minimal plaque by cath 5/12  . CHF (congestive heart failure) (HCC)    EF 55-60%  . Chronic atrial fibrillation with RVR (HCC)   . Chronic diastolic heart failure (HCC)    Echocardiogram 12/10/11: Moderate LVH, EF 65-70%, moderate aortic stenosis, mean gradient 20, mild MS  . Chronic mid back pain   . DJD (degenerative joint disease) of hip    s/p R THR 10/2010  . DVT (deep venous thrombosis) ([place]) ~ 2012   LLE  . History of blood transfusion 1957   "related to childbirth"  . Hyperlipidemia   . Hypertension   . Insulin pump in place   . Iron deficiency anemia   . Kidney stones ~ 1958   "no OR"  . Migraine    "none since my hysterectomy" (08/24/2015)  . Mitral stenosis    Mild by echo 4/13  . Myocardial infarction 12/2010  . PAD (peripheral artery disease) (HCC)    s/p bilateral comon iliac artery stenting in 2002. Known significant  R SFA  disease. carotid dz noted 11/2011 followed by Dr. Bridgett Larsson  . Skin cancer    right forehead/head  . Type II diabetes mellitus ([place]) dx'd 1999  . Venous insufficiency     Past Surgical History:  Procedure Laterality Date  . ABDOMINAL HYSTERECTOMY    . APPENDECTOMY    . BACK SURGERY    . CARDIAC CATHETERIZATION  01/10/2011   No significant CAD  . CATARACT EXTRACTION, BILATERAL Bilateral 2015  . CHOLECYSTECTOMY OPEN    . ILIAC ARTERY STENT Bilateral 2002   [place]  . JOINT REPLACEMENT    . [place] SURGERY  1999  . REVISION TOTAL HIP ARTHROPLASTY  1994  . SKIN CANCER EXCISION  2016   top of right forehead/head  . TONSILLECTOMY    . TOTAL HIP ARTHROPLASTY Right 1991    Current Medications: Outpatient Medications Prior to Visit  Medication Sig Dispense Refill  . acetaminophen (TYLENOL) 500 MG tablet Take 1,000 mg by mouth every 6 (six) hours as needed for moderate pain.    Marland Kitchen amLODipine (NORVASC) 5 MG tablet Take 1 tablet (5 mg total) by mouth  daily. 90 tablet 3  . Ascorbic Acid (VITAMIN C) 500 MG tablet Take 500 mg by mouth daily.      Marland Kitchen aspirin EC 81 MG tablet Take 81 mg by mouth 2 (two) times daily.    Marland Kitchen b complex vitamins tablet Take 1 tablet by mouth daily.      . Cholecalciferol (VITAMIN D3) 2000 UNITS capsule Take 2,000 Units by mouth daily.      . fish oil-omega-3 fatty acids 1000 MG capsule Take 2 g by mouth 2 (two) times daily.     Marland Kitchen gabapentin (NEURONTIN) 100 MG capsule Take 2 capsules (200 mg total) by mouth at bedtime.    . Insulin Infusion Pump Supplies (INSET INFUSION SET 23" 6MM) MISC 1 Device by Does not apply  route every 3 (three) days.    . Insulin Infusion Pump Supplies (INSULIN PUMP SYRINGE RESERVOIR) MISC 1 Device by Does not apply route every 3 (three) days. [place] 2 ml    . insulin lispro (HUMALOG) 100 UNIT/ML injection Inject via insulin pump for a total of 70 units per day 70 mL 3  . levofloxacin (LEVAQUIN) 500 MG tablet Take 1 tablet (500 mg total) by mouth every other day. 4 tablet 0  . metoprolol (LOPRESSOR) 100 MG tablet TAKE 1 TABLET (100 MG TOTAL) BY MOUTH 2 (TWO) TIMES DAILY. 60 tablet 11  . omeprazole (PRILOSEC) 40 MG capsule Take 1 capsule by mouth  daily before breakfast 90 capsule 2  . pravastatin (PRAVACHOL) 20 MG tablet TAKE 1 TABLET BY MOUTH IN THE EVENING 90 tablet 1  . Probiotic Product (PROBIOTIC PO) Take 1 tablet by mouth daily at 10 pm.    . furosemide (LASIX) 80 MG tablet Take 80 mg by mouth every Monday, Wednesday, Friday, and Saturday at 6 PM.     . furosemide (LASIX) 40 MG tablet Take 1 tablet (40 mg total) by mouth every evening. 30 tablet 0   No facility-administered medications prior to visit.      Allergies:   Carvedilol; Augmentin [amoxicillin-pot clavulanate]; Clindamycin/lincomycin; Diltiazem hcl; Fenofibrate; Hctz [hydrochlorothiazide]; Nisoldipine; Nitroglycerin; Propranolol; Septra [sulfamethoxazole-trimethoprim]; Tekturna [aliskiren fumarate]; and [place]  [aliskiren-valsartan]   Social History   Social History  . Marital status: Widowed    Spouse name: N/A  . Number of children: N/A  . Years of education: N/A   Occupational History  . Retired         Social History Main Topics  . Smoking status: Former Smoker    Packs/day: 2.00    Years: 37.00    Types: Cigarettes    Quit date: 08/20/1985  . Smokeless tobacco: Never Used  . Alcohol use No  . Drug use: No  . Sexual activity: No   Other Topics Concern  . None   Social History Narrative   Lives with son in [place], [place] smoking in 1987     Family History:  The patient's family history includes Diabetes in her father and other.   ROS:   Please see the history of present illness.    ROS All other systems reviewed and are negative.   PHYSICAL EXAM:   VS:  BP (!) 144/61   Pulse 71   Ht 5\' 4"  (1.626 m)   Wt 151 lb 9.6 oz (68.8 kg)   BMI 26.02 kg/m    GEN: Well nourished, well developed, in no acute distress  HEENT: normal  Neck: no JVD, carotid bruits, or masses Cardiac: RRR; no murmurs, rubs, or gallops. 2-3+ pitting edema in bilateral lower extremity. Respiratory:  clear to auscultation bilaterally, normal work of breathing GI: soft, nontender, nondistended, + BS MS: no deformity or atrophy  Skin: warm and dry, no rash Neuro:  Alert and Oriented x 3, Strength and sensation are intact Psych: euthymic mood, full affect  Wt Readings from Last 3 Encounters:  06/27/16 151 lb 9.6 oz (68.8 kg)  06/05/16 141 lb 5 oz (64.1 kg)  05/22/16 149 lb (67.6 kg)      Studies/Labs Reviewed:   EKG:  EKG is not ordered today.    Recent Labs: 08/31/2015: TSH 1.90 06/01/2016: B Natriuretic Peptide 188.0 06/03/2016: Hemoglobin 13.5; Platelets 228 06/04/2016: BUN 46; Creatinine, Ser 1.51; Potassium 3.7; Sodium 138   Lipid  Panel    Component Value Date/Time   CHOL 117 08/31/2015 1613   TRIG 133.0 08/31/2015 1613   HDL 48.80 08/31/2015 1613   CHOLHDL  2 08/31/2015 1613   VLDL 26.6 08/31/2015 1613   LDLCALC 42 08/31/2015 1613    Additional studies/ records that were reviewed today include:   Echo 05/31/2016 LV EF: 65% -   70%  ------------------------------------------------------------------- History:   PMH:   Coronary artery disease.  Congestive heart failure.  Risk factors:  Hypertension. Diabetes mellitus. Dyslipidemia.  ------------------------------------------------------------------- Study Conclusions  - Left ventricle: The cavity size was normal. Wall thickness was   increased in a pattern of mild LVH. Systolic function was   vigorous. The estimated ejection fraction was in the range of 65%   to 70%. Wall motion was normal; there were no regional wall   motion abnormalities. - Aortic valve: There was mild regurgitation. - Mitral valve: Severely calcified annulus. The findings are   consistent with mild stenosis. - Left atrium: The atrium was moderately dilated. - Right atrium: The atrium was mildly dilated. - Pericardium, extracardiac: There was a left pleural effusion.  Impressions:  - Vigorous LV systolic function; mild LVH; biatrial enlargement;   calcified aortic valve with mild AI; narrow LVOT with resting   gradient of 2 m/s; severe MAC with mild MS (mean gradient 6   mmHg); trace MR; mild TR.   CTA of chest 05/31/2016 IMPRESSION: 1. No evidence of significant pulmonary embolus. 2. Small bilateral pleural effusions noted. Partial consolidation of both lower lung lobes, which may reflect pulmonary edema or possibly infection. 3. Mild cardiomegaly, with biatrial enlargement. 4. Mildly enlarged mediastinal nodes, measuring up to 1.3 cm in short axis.   ASSESSMENT:    1. Acute on chronic diastolic heart failure ([place])   2. PAD (peripheral artery disease) ([place])   3. Chronic atrial fibrillation ([place])   4. CKD (chronic kidney disease), stage III   5. Essential hypertension   6. Hyperlipidemia,  unspecified hyperlipidemia type   7. IDDM (insulin dependent diabetes mellitus) ([place])      PLAN:  In order of problems listed above:  1. Acute on chronic diastolic heart failure: For some reason, her current diuretic dosage is even lower than before she will presented to the hospital with acute heart failure recently. After discussing with nursing home, it appears she was discharged on 80 mg a.m. and 40 mg p.m. dose of Lasix, however her creatinine trended up to 1.6 from 1.5 prompting the nursing home to decrease her Lasix. Given fluid accumulation, I will put her back on 80 mg a.m. of Lasix daily and 40 mg p.m. Lasix every other day. I'll see her back in 7-10 days, at which time I plan to obtain a basic metabolic panel.  2. PAD: [name] due for bilateral lower extremity arterial Doppler, we will arrange.  3. Hypertension currently on metoprolol 100 mg twice a day and amlodipine 5 mg daily. Will continue on the current dose.  4. Hyperlipidemia: On pravastatin at home.  5. Insulin-dependent diabetes mellitus: Managed by PCP    Medication Adjustments/Labs and Tests Ordered: Current medicines are reviewed at length with the patient today.  Concerns regarding medicines are outlined above.  Medication changes, Labs and Tests ordered today are listed in the Patient Instructions below. Patient Instructions  Medication Instructions:  TAKE- Furosemide 80 mg daily and 40 mg every other day   Labwork: None Ordered  Testing/Procedures: Your physician has requested that you have a  lower extremity arterial duplex. This test is an ultrasound of the arteries in the legs or arms. It looks at arterial blood flow in the legs and arms. Allow one hour for Lower and Upper Arterial scans. There are no restrictions or special instructions   Follow-Up: Your physician recommends that you schedule a follow-up appointment in: 7-10 days with Almyra Deforest   Any Other Special Instructions Will Be Listed Below (If  Applicable).   If you need a refill on your cardiac medications before your next appointment, please call your pharmacy.      Hilbert Corrigan, [place]  06/27/2016 10:29 PM    [address], [place], [place]  [address] Phone: [phone]; Fax: [phone]

## 2016-06-27 NOTE — Patient Instructions (Signed)
Medication Instructions:  TAKE- Furosemide 80 mg daily and 40 mg every other day   Labwork: None Ordered  Testing/Procedures: Your physician has requested that you have a lower extremity arterial duplex. This test is an ultrasound of the arteries in the legs or arms. It looks at arterial blood flow in the legs and arms. Allow one hour for Lower and Upper Arterial scans. There are no restrictions or special instructions   Follow-Up: Your physician recommends that you schedule a follow-up appointment in: 7-10 days with Almyra Deforest   Any Other Special Instructions Will Be Listed Below (If Applicable).   If you need a refill on your cardiac medications before your next appointment, please call your pharmacy.

## 2016-06-28 ENCOUNTER — Other Ambulatory Visit: Payer: Self-pay | Admitting: Internal Medicine

## 2016-06-29 ENCOUNTER — Ambulatory Visit (HOSPITAL_COMMUNITY)
Admission: RE | Admit: 2016-06-29 | Discharge: 2016-06-29 | Disposition: A | Discharge status: Home / Self Care | Payer: Medicare Other | Source: Ambulatory Visit | Attending: Physician Assistant | Admitting: Physician Assistant

## 2016-06-29 ENCOUNTER — Encounter (HOSPITAL_COMMUNITY): Payer: Self-pay

## 2016-06-29 ENCOUNTER — Other Ambulatory Visit: Payer: Self-pay

## 2016-06-29 ENCOUNTER — Ambulatory Visit (HOSPITAL_COMMUNITY): Admission: RE | Admit: 2016-06-29 | Payer: Medicare Other | Source: Ambulatory Visit

## 2016-06-29 DIAGNOSIS — I739 Peripheral vascular disease, unspecified: Secondary | ICD-10-CM

## 2016-06-29 NOTE — Progress Notes (Signed)
VASCULAR LAB PRELIMINARY  ARTERIAL  ABI completed:    RIGHT    LEFT    PRESSURE WAVEFORM  PRESSURE WAVEFORM  BRACHIAL 175 Triphasic BRACHIAL 169 Triphasic  DP 128 Biphasic DP 157 Triphasic  PT 108 Monophasic PT 136 Triphasic    RIGHT LEFT  ABI 0.73 0.90   Right ABIs indicate a moderate reduction in arterial flow at rest. The left ABI indicate a mild reduction in arterall flow at rest. There is no significant change since the study of 06/2015  Emma Yu, [place], RVS 06/29/2016, 4:18 PM

## 2016-07-03 DIAGNOSIS — Z85828 Personal history of other malignant neoplasm of skin: Secondary | ICD-10-CM | POA: Diagnosis not present

## 2016-07-03 DIAGNOSIS — I2510 Atherosclerotic heart disease of native coronary artery without angina pectoris: Secondary | ICD-10-CM | POA: Diagnosis not present

## 2016-07-03 DIAGNOSIS — Z87891 Personal history of nicotine dependence: Secondary | ICD-10-CM | POA: Diagnosis not present

## 2016-07-03 DIAGNOSIS — I130 Hypertensive heart and chronic kidney disease with heart failure and stage 1 through stage 4 chronic kidney disease, or unspecified chronic kidney disease: Secondary | ICD-10-CM | POA: Diagnosis not present

## 2016-07-03 DIAGNOSIS — E1121 Type 2 diabetes mellitus with diabetic nephropathy: Secondary | ICD-10-CM | POA: Diagnosis not present

## 2016-07-03 DIAGNOSIS — I4891 Unspecified atrial fibrillation: Secondary | ICD-10-CM | POA: Diagnosis not present

## 2016-07-03 DIAGNOSIS — I5031 Acute diastolic (congestive) heart failure: Secondary | ICD-10-CM | POA: Diagnosis not present

## 2016-07-03 DIAGNOSIS — N183 Chronic kidney disease, stage 3 (moderate): Secondary | ICD-10-CM | POA: Diagnosis not present

## 2016-07-03 DIAGNOSIS — E1151 Type 2 diabetes mellitus with diabetic peripheral angiopathy without gangrene: Secondary | ICD-10-CM | POA: Diagnosis not present

## 2016-07-03 DIAGNOSIS — E1122 Type 2 diabetes mellitus with diabetic chronic kidney disease: Secondary | ICD-10-CM | POA: Diagnosis not present

## 2016-07-04 ENCOUNTER — Ambulatory Visit
Admission: RE | Admit: 2016-07-04 | Discharge: 2016-07-04 | Disposition: A | Discharge status: Home / Self Care | Payer: Medicare Other | Source: Ambulatory Visit | Attending: Physician Assistant | Admitting: Physician Assistant

## 2016-07-04 ENCOUNTER — Telehealth: Payer: Self-pay | Admitting: Internal Medicine

## 2016-07-04 ENCOUNTER — Ambulatory Visit (INDEPENDENT_AMBULATORY_CARE_PROVIDER_SITE_OTHER): Payer: Medicare Other | Admitting: Physician Assistant

## 2016-07-04 ENCOUNTER — Encounter: Payer: Self-pay | Admitting: Physician Assistant

## 2016-07-04 VITALS — BP 122/57 | HR 79 | Ht 64.0 in | Wt 152.8 lb

## 2016-07-04 DIAGNOSIS — R05 Cough: Secondary | ICD-10-CM | POA: Diagnosis not present

## 2016-07-04 DIAGNOSIS — I739 Peripheral vascular disease, unspecified: Secondary | ICD-10-CM

## 2016-07-04 DIAGNOSIS — I482 Chronic atrial fibrillation, unspecified: Secondary | ICD-10-CM

## 2016-07-04 DIAGNOSIS — Z85828 Personal history of other malignant neoplasm of skin: Secondary | ICD-10-CM | POA: Diagnosis not present

## 2016-07-04 DIAGNOSIS — I4891 Unspecified atrial fibrillation: Secondary | ICD-10-CM | POA: Diagnosis not present

## 2016-07-04 DIAGNOSIS — I10 Essential (primary) hypertension: Secondary | ICD-10-CM

## 2016-07-04 DIAGNOSIS — N183 Chronic kidney disease, stage 3 unspecified: Secondary | ICD-10-CM

## 2016-07-04 DIAGNOSIS — E1151 Type 2 diabetes mellitus with diabetic peripheral angiopathy without gangrene: Secondary | ICD-10-CM | POA: Diagnosis not present

## 2016-07-04 DIAGNOSIS — R059 Cough, unspecified: Secondary | ICD-10-CM

## 2016-07-04 DIAGNOSIS — I5033 Acute on chronic diastolic (congestive) heart failure: Secondary | ICD-10-CM | POA: Diagnosis not present

## 2016-07-04 DIAGNOSIS — Z794 Long term (current) use of insulin: Secondary | ICD-10-CM

## 2016-07-04 DIAGNOSIS — Z87891 Personal history of nicotine dependence: Secondary | ICD-10-CM | POA: Diagnosis not present

## 2016-07-04 DIAGNOSIS — I5031 Acute diastolic (congestive) heart failure: Secondary | ICD-10-CM | POA: Diagnosis not present

## 2016-07-04 DIAGNOSIS — Z79899 Other long term (current) drug therapy: Secondary | ICD-10-CM | POA: Diagnosis not present

## 2016-07-04 DIAGNOSIS — IMO0001 Reserved for inherently not codable concepts without codable children: Secondary | ICD-10-CM

## 2016-07-04 DIAGNOSIS — R0602 Shortness of breath: Secondary | ICD-10-CM

## 2016-07-04 DIAGNOSIS — E785 Hyperlipidemia, unspecified: Secondary | ICD-10-CM

## 2016-07-04 DIAGNOSIS — E1121 Type 2 diabetes mellitus with diabetic nephropathy: Secondary | ICD-10-CM | POA: Diagnosis not present

## 2016-07-04 DIAGNOSIS — E1122 Type 2 diabetes mellitus with diabetic chronic kidney disease: Secondary | ICD-10-CM | POA: Diagnosis not present

## 2016-07-04 DIAGNOSIS — I130 Hypertensive heart and chronic kidney disease with heart failure and stage 1 through stage 4 chronic kidney disease, or unspecified chronic kidney disease: Secondary | ICD-10-CM | POA: Diagnosis not present

## 2016-07-04 DIAGNOSIS — I2510 Atherosclerotic heart disease of native coronary artery without angina pectoris: Secondary | ICD-10-CM | POA: Diagnosis not present

## 2016-07-04 DIAGNOSIS — E119 Type 2 diabetes mellitus without complications: Secondary | ICD-10-CM

## 2016-07-04 LAB — CBC
HCT: 38.5 % (ref 35.0–45.0)
Hemoglobin: 12.1 g/dL (ref 11.7–15.5)
MCH: 30.9 pg (ref 27.0–33.0)
MCHC: 31.4 g/dL — ABNORMAL LOW (ref 32.0–36.0)
MCV: 98.5 fL (ref 80.0–100.0)
MPV: 9 fL (ref 7.5–12.5)
PLATELETS: 219 10*3/uL (ref 140–400)
RBC: 3.91 MIL/uL (ref 3.80–5.10)
RDW: 18.7 % — AB (ref 11.0–15.0)
WBC: 7.5 10*3/uL (ref 3.8–10.8)

## 2016-07-04 LAB — BASIC METABOLIC PANEL
BUN: 32 mg/dL — ABNORMAL HIGH (ref 7–25)
CALCIUM: 10.3 mg/dL (ref 8.6–10.4)
CO2: 31 mmol/L (ref 20–31)
CREATININE: 1.13 mg/dL — AB (ref 0.60–0.88)
Chloride: 98 mmol/L (ref 98–110)
Glucose, Bld: 161 mg/dL — ABNORMAL HIGH (ref 65–99)
Potassium: 4.7 mmol/L (ref 3.5–5.3)
Sodium: 139 mmol/L (ref 135–146)

## 2016-07-04 MED ORDER — FUROSEMIDE 80 MG PO TABS: 80.0000 mg | ORAL_TABLET | Freq: Every day | ORAL | 9 refills | Status: DC

## 2016-07-04 MED ORDER — BENZONATATE 100 MG PO CAPS: 100.0000 mg | ORAL_CAPSULE | Freq: Three times a day (TID) | ORAL | 0 refills | Status: DC | PRN

## 2016-07-04 NOTE — Telephone Encounter (Signed)
Requesting verbal orders for OT - twice a week for two weeks. Patient had cough today.  Patient states has has since Sunday.  Patient short of breath just sitting.  With oxygen fluctuation between 85 and 93.  Walking dropped to 82.   States patient did have appt with cardiologist today.

## 2016-07-04 NOTE — Progress Notes (Signed)
Cardiology Office Note    Date:  07/04/2016   ID:  Emma Yu, DOB Sep 01, 1926, MRN [id number]  PCP:  [name], MD  Cardiologist:  Dr. Fletcher [name] For [place] and Dr. Johnsie Cancel for cardiology   Chief Complaint  Patient presents with  . Follow-up    seen for Dr. Johnsie Cancel, acute on chronic diastolic heart failure    History of Present Illness:  Emma Yu is a 82 y.o. female with PMH of chronic atrial fibrillation not a candidate for systemic anticoagulation due to recurrent falls, chronic diastolic heart failure, CKD stage III with Cr 1.4-1.7, PAD s/p bilateral common iliac artery stenting in 2002 with known significant right SFA disease, diabetes, hypertension, hyperlipidemia. Last cardiac catheterization on 01/10/2011 showed luminal irregularities. Prior echocardiogram obtained in January 2017 showed a normal ejection fraction, mild aortic insufficiency, calcified aortic valve without significant stenosis.   She was last seen in the cardiology office on 05/02/2016 for evaluation of shortness of breath and also chest pressure. Her Lasix was increased for 2 days which helped with the shortness of breath, however the symptoms recurred and weight increased again. Her Lasix was again increased to 80 mg twice a day for 3 days, however creatinine trended up to 2.18. Her Diovan was stopped. She was continued on 80 mg Lasix daily. Due to hyperkalemia, her potassium supplement was stopped on top of the Diovan. 3 month cardiology follow-up with Dr. Johnsie Cancel was recommended. Patient was admitted again to the hospital on 05/30/2016 with shortness of breath and increased weight gain. She was diuresed with IV Lasix 80 mg twice a day. CTA of the chest obtained on 10/12 was negative for PE, small bilateral pleural effusion was noted, mild cardiomegaly, mildly enlarged mediastinotomy nodes measuring up to 1.3 cm. Repeat echocardiogram obtained on 10/12 showed EF 65-70%, mild AS. She was eventually discharged on  home Lasix 80 mg daily in a.m. and 40 mg daily in p.m. Her discharge weight was 141 pounds. She was also discharged on home O2.  I last saw the patient on 11/80/2017, however, she was on even lower dose of Lasix now compared to before she went to the hospital. She was on 80 mg Lasix except for Wednesday and Sunday. I discussed with the nursing home, it appears her creatinine trended up from 1.5-1.6 prompting them to start decreasing her Lasix dose. Upon arrival at cardiology office, she complained of worsening dyspnea on exertion and mild abdominal distention. She had at least 2-3+ pitting edema in the lower extremities. She also has gray discoloration in the bilateral lower extremity, surprisingly she did not have any pain at rest. Lower extremity ABI was obtained, she does have moderate decreased flow on the right, this is unchanged compared to the previous study in 2016. She will follow-up with Dr. Fletcher [name]. I have put her back on 80 mg daily of Lasix alternating with 40 mg p.m. dose of Lasix every other day.  She presents back for follow-up cardiology follow-up, she is requesting something for her cold. She says she has a nasal drip and also a dry cough. She denies any fever or chill or any chronic productive cough. She has not noticed much improvement after starting on the higher dose of Lasix, she continued to have 2-3+ pitting edema in the bilateral lower extremity. She does seems to be shaking more, asking her friend, it appears she sometimes has episodic shaking. [name] not aware of any history of Parkinson disease. Given her signs of fluid  overload, I have decided to increase her Lasix to 80 mg twice a day for 3 days, then titrated down to 80 mg in the morning and 40 mg in the afternoon. She was last seen by Dr. Aundra Dubin on 07/10/2014. If I am unable to manage her fluid volume, I will refer her to the heart failure clinic.   Past Medical History:  Diagnosis Date  . Acute respiratory failure ([place])     12/2011 admission - decreased O2 sats on ambulation, improved by time of discharge  . Aortic stenosis    Moderate by echo 4/13 - mean 20 mmHg  . Arthritis    "back" (08/24/2015)  . Atrial fibrillation ([place])    not a coumadin candidate secondary to fall risk  . CAD (coronary artery disease)    minimal plaque by cath 5/12  . CHF (congestive heart failure) (HCC)    EF 55-60%  . Chronic atrial fibrillation with RVR (HCC)   . Chronic diastolic heart failure (HCC)    Echocardiogram 12/10/11: Moderate LVH, EF 65-70%, moderate aortic stenosis, mean gradient 20, mild MS  . Chronic mid back pain   . DJD (degenerative joint disease) of hip    s/p R THR 10/2010  . DVT (deep venous thrombosis) ([place]) ~ 2012   LLE  . History of blood transfusion 1957   "related to childbirth"  . Hyperlipidemia   . Hypertension   . Insulin pump in place   . Iron deficiency anemia   . Kidney stones ~ 1958   "no OR"  . Migraine    "none since my hysterectomy" (08/24/2015)  . Mitral stenosis    Mild by echo 4/13  . Myocardial infarction 12/2010  . PAD (peripheral artery disease) (HCC)    s/p bilateral comon iliac artery stenting in 2002. Known significant  R SFA  disease. carotid dz noted 11/2011 followed by Dr. Bridgett Larsson  . Skin cancer    right forehead/head  . Type II diabetes mellitus ([place]) dx'd 1999  . Venous insufficiency     Past Surgical History:  Procedure Laterality Date  . ABDOMINAL HYSTERECTOMY    . APPENDECTOMY    . BACK SURGERY    . CARDIAC CATHETERIZATION  01/10/2011   No significant CAD  . CATARACT EXTRACTION, BILATERAL Bilateral 2015  . CHOLECYSTECTOMY OPEN    . ILIAC ARTERY STENT Bilateral 2002   [place]  . JOINT REPLACEMENT    . [place] SURGERY  1999  . REVISION TOTAL HIP ARTHROPLASTY  1994  . SKIN CANCER EXCISION  2016   top of right forehead/head  . TONSILLECTOMY    . TOTAL HIP ARTHROPLASTY Right 1991    Current Medications: Outpatient Medications Prior to Visit  Medication Sig  Dispense Refill  . acetaminophen (TYLENOL) 500 MG tablet Take 1,000 mg by mouth every 6 (six) hours as needed for moderate pain.    Marland Kitchen amLODipine (NORVASC) 5 MG tablet Take 1 tablet (5 mg total) by mouth daily. 90 tablet 3  . Ascorbic Acid (VITAMIN C) 500 MG tablet Take 500 mg by mouth daily.      Marland Kitchen aspirin EC 81 MG tablet Take 81 mg by mouth 2 (two) times daily.    Marland Kitchen b complex vitamins tablet Take 1 tablet by mouth daily.      . Cholecalciferol (VITAMIN D3) 2000 UNITS capsule Take 2,000 Units by mouth daily.      . fish oil-omega-3 fatty acids 1000 MG capsule Take 2 g by mouth 2 (  two) times daily.     . furosemide (LASIX) 40 MG tablet Take 1 tablet (40 mg total) by mouth every other day. (Patient taking differently: Take 40 mg by mouth daily. ) 30 tablet 6  . gabapentin (NEURONTIN) 100 MG capsule Take 2 capsules (200 mg total) by mouth at bedtime.    . Insulin Infusion Pump Supplies (INSET INFUSION SET 23" 6MM) MISC 1 Device by Does not apply route every 3 (three) days.    . Insulin Infusion Pump Supplies (INSULIN PUMP SYRINGE RESERVOIR) MISC 1 Device by Does not apply route every 3 (three) days. [place] 2 ml    . insulin lispro (HUMALOG) 100 UNIT/ML injection Inject via insulin pump for a total of 70 units per day 70 mL 3  . levofloxacin (LEVAQUIN) 500 MG tablet Take 1 tablet (500 mg total) by mouth every other day. 4 tablet 0  . metoprolol (LOPRESSOR) 100 MG tablet TAKE 1 TABLET (100 MG TOTAL) BY MOUTH 2 (TWO) TIMES DAILY. 60 tablet 11  . omeprazole (PRILOSEC) 40 MG capsule Take 1 capsule by mouth  daily before breakfast 90 capsule 2  . pravastatin (PRAVACHOL) 20 MG tablet TAKE 1 TABLET BY MOUTH IN THE EVENING 90 tablet 1  . Probiotic Product (PROBIOTIC PO) Take 1 tablet by mouth daily at 10 pm.    . furosemide (LASIX) 80 MG tablet Take 1 tablet (80 mg total) by mouth daily. 30 tablet 9   No facility-administered medications prior to visit.      Allergies:   Carvedilol; Augmentin  [amoxicillin-pot clavulanate]; Clindamycin/lincomycin; Diltiazem hcl; Fenofibrate; Hctz [hydrochlorothiazide]; Nisoldipine; Nitroglycerin; Propranolol; Septra [sulfamethoxazole-trimethoprim]; Tekturna [aliskiren fumarate]; and [place] [aliskiren-valsartan]   Social History   Social History  . Marital status: Widowed    Spouse name: N/A  . Number of children: N/A  . Years of education: N/A   Occupational History  . Retired         Social History Main Topics  . Smoking status: Former Smoker    Packs/day: 2.00    Years: 37.00    Types: Cigarettes    Quit date: 08/20/1985  . Smokeless tobacco: Never Used  . Alcohol use No  . Drug use: No  . Sexual activity: No   Other Topics Concern  . None   Social History Narrative   Lives with son in [place], [place] smoking in 1987     Family History:  The patient's family history includes Diabetes in her father and other.   ROS:   Please see the history of present illness.    ROS All other systems reviewed and are negative.   PHYSICAL EXAM:   VS:  BP (!) 122/57   Pulse 79   Ht 5\' 4"  (1.626 m)   Wt 152 lb 12.8 oz (69.3 kg)   SpO2 96%   BMI 26.23 kg/m    GEN: Well nourished, well developed, in no acute distress  HEENT: normal  Neck: no JVD, carotid bruits, or masses Cardiac: RRR; no murmurs, rubs, or gallops,no edema  Respiratory:  clear to auscultation bilaterally, normal work of breathing GI: soft, nontender, nondistended, + BS MS: no deformity or atrophy  Skin: warm and dry, no rash Neuro:  Alert and Oriented x 3, Strength and sensation are intact Psych: euthymic mood, full affect  Wt Readings from Last 3 Encounters:  07/04/16 152 lb 12.8 oz (69.3 kg)  06/27/16 151 lb 9.6 oz (68.8 kg)  06/05/16 141 lb 5  oz (64.1 kg)      Studies/Labs Reviewed:   EKG:  EKG is not ordered today.    Recent Labs: 08/31/2015: TSH 1.90 06/01/2016: B Natriuretic Peptide 188.0 06/03/2016: Hemoglobin 13.5; Platelets  228 06/04/2016: BUN 46; Creatinine, Ser 1.51; Potassium 3.7; Sodium 138   Lipid Panel    Component Value Date/Time   CHOL 117 08/31/2015 1613   TRIG 133.0 08/31/2015 1613   HDL 48.80 08/31/2015 1613   CHOLHDL 2 08/31/2015 1613   VLDL 26.6 08/31/2015 1613   LDLCALC 42 08/31/2015 1613    Additional studies/ records that were reviewed today include:   Echo 05/31/2016 LV EF: 65% - 70%  - Left ventricle: The cavity size was normal. Wall thickness was increased in a pattern of mild LVH. Systolic function was vigorous. The estimated ejection fraction was in the range of 65% to 70%. Wall motion was normal; there were no regional wall motion abnormalities. - Aortic valve: There was mild regurgitation. - Mitral valve: Severely calcified annulus. The findings are consistent with mild stenosis. - Left atrium: The atrium was moderately dilated. - Right atrium: The atrium was mildly dilated. - Pericardium, extracardiac: There was a left pleural effusion.  Impressions:  - Vigorous LV systolic function; mild LVH; biatrial enlargement; calcified aortic valve with mild AI; narrow LVOT with resting gradient of 2 m/s; severe MAC with mild MS (mean gradient 6 mmHg); trace MR; mild TR.   CTA of chest 05/31/2016 IMPRESSION: 1. No evidence of significant pulmonary embolus. 2. Small bilateral pleural effusions noted. Partial consolidation of both lower lung lobes, which may reflect pulmonary edema or possibly infection. 3. Mild cardiomegaly, with biatrial enlargement. 4. Mildly enlarged mediastinal nodes, measuring up to 1.3 cm in short axis.   ABI 06/29/2016 Summary:  - Right ABIs indicate a mderate reduction in areial flow at rest. - Left ABIs indicate a mild reduction in arerial flow at rest. - There is no significant change since study of 06/2015   ASSESSMENT:    1. Acute on chronic diastolic heart failure ([place])   2. Medication management   3. SOB  (shortness of breath)   4. Cough   5. Chronic atrial fibrillation (HCC)   6. CKD (chronic kidney disease), stage III   7. PAD (peripheral artery disease) ([place])   8. Essential hypertension   9. Hyperlipidemia, unspecified hyperlipidemia type   10. IDDM (insulin dependent diabetes mellitus) ([place])      PLAN:  In order of problems listed above:  1. Acute on chronic diastolic heart failure: She was previously discharged from the hospital with 80 mg a.m. and 40 mg p.m. dose of Lasix, however her creatinine trended up from 1.5 to 1.6 prompting to her nursing home to cut it back. Due to weight gain, I have put her back on 80 mg in the morning and a 40 mg every other day during the last office visit. She still massively fluid overloaded. I will check a CBC and basic metabolic panel. I have also started her 80 mg twice a day of Lasix for 3 days then go down to 80 mg in the morning and 40 mg in the afternoon. If she still have significant edema of follow-up, I will refer her to the heart failure clinic. O2 saturation 96% on room air.  2. Nonproductive cough and nasal drainage: I have prescribed her Tessalon Perles. She denies any fever chills or productive cough.  - Given her recent hospitalization, I will also obtain a two-view chest x-ray  to make sure there is no sign of pneumonia. I have urged her to follow-up with her PCP closely for the cold. I have also instructed the family to bring her to the ED if her condition deteriorate. It appears patient is very hesitant to go back to the hospital again. We'll try to manage as outpatient as long as her condition is stable.  3. Chronic atrial fibrillation not a candidate for systemic anticoagulation due to recurrent fall - Rate controlled.  4. CKD stage III: Previous creatinine was 1.3 in October, last creatinine 1.51 on 06/04/2016, now she has had a week of increased diuretic, I will check a basic metabolic panel.  5. Hypertension: Blood pressure stable  122/57.  6. Hyperlipidemia: On Pravachol  7. Insulin-dependent diabetes mellitus: On insulin pump. Due to her shaking, we did check blood glucose in the office, it was 200.    Medication Adjustments/Labs and Tests Ordered: Current medicines are reviewed at length with the patient today.  Concerns regarding medicines are outlined above.  Medication changes, Labs and Tests ordered today are listed in the Patient Instructions below. Patient Instructions  Medication Instructions:  Take 80 mg twice a day for three days then decrease 80 mg in the morning and 40 mg in the evening  Labwork: CBC and BMP  Testing/Procedures: A chest x-ray takes a picture of the organs and structures inside the chest, including the heart, lungs, and blood vessels. This test can show several things, including, whether the heart is enlarges; whether fluid is building up in the lungs; and whether pacemaker / defibrillator leads are still in place.   Follow-Up: Your physician recommends that you schedule a follow-up appointment in: 1 Week with [facility]   Your physician recommends that you schedule a follow-up appointment in: With Primary Care Physician   Any Other Special Instructions Will Be Listed Below (If Applicable).          Happy Thanksgiving  If you need a refill on your cardiac medications before your next appointment, please call your pharmacy.      Hilbert Corrigan, [place]  07/04/2016 4:34 PM    [facility] [address], [place], [place]  [address] Phone: [phone]; Fax: [phone]

## 2016-07-04 NOTE — Telephone Encounter (Signed)
Informed Emma Yu that the last [id number] was done in July and the result was 6.7%

## 2016-07-04 NOTE — Telephone Encounter (Signed)
Home health is requesting a order to get a A1C on the patient. The last one they have was in Jan 2017. Please follow up, Thank you.  [id number] - Emma Yu

## 2016-07-04 NOTE — Patient Instructions (Addendum)
Medication Instructions:  Take 80 mg twice a day for three days then decrease 80 mg in the morning and 40 mg in the evening  Labwork: CBC and BMP  Testing/Procedures: A chest x-ray takes a picture of the organs and structures inside the chest, including the heart, lungs, and blood vessels. This test can show several things, including, whether the heart is enlarges; whether fluid is building up in the lungs; and whether pacemaker / defibrillator leads are still in place.   Follow-Up: Your physician recommends that you schedule a follow-up appointment in: 1 Week with [facility]   Your physician recommends that you schedule a follow-up appointment in: With Primary Care Physician   Any Other Special Instructions Will Be Listed Below (If Applicable).          Happy Thanksgiving  If you need a refill on your cardiac medications before your next appointment, please call your pharmacy.

## 2016-07-05 ENCOUNTER — Telehealth: Payer: Self-pay | Admitting: Emergency Medicine

## 2016-07-05 ENCOUNTER — Telehealth: Payer: Self-pay | Admitting: Cardiovascular Disease

## 2016-07-05 ENCOUNTER — Telehealth: Payer: Self-pay | Admitting: Internal Medicine

## 2016-07-05 DIAGNOSIS — E1151 Type 2 diabetes mellitus with diabetic peripheral angiopathy without gangrene: Secondary | ICD-10-CM | POA: Diagnosis not present

## 2016-07-05 DIAGNOSIS — I4891 Unspecified atrial fibrillation: Secondary | ICD-10-CM | POA: Diagnosis not present

## 2016-07-05 DIAGNOSIS — N183 Chronic kidney disease, stage 3 (moderate): Secondary | ICD-10-CM | POA: Diagnosis not present

## 2016-07-05 DIAGNOSIS — Z85828 Personal history of other malignant neoplasm of skin: Secondary | ICD-10-CM | POA: Diagnosis not present

## 2016-07-05 DIAGNOSIS — I130 Hypertensive heart and chronic kidney disease with heart failure and stage 1 through stage 4 chronic kidney disease, or unspecified chronic kidney disease: Secondary | ICD-10-CM | POA: Diagnosis not present

## 2016-07-05 DIAGNOSIS — I5031 Acute diastolic (congestive) heart failure: Secondary | ICD-10-CM | POA: Diagnosis not present

## 2016-07-05 DIAGNOSIS — Z87891 Personal history of nicotine dependence: Secondary | ICD-10-CM | POA: Diagnosis not present

## 2016-07-05 DIAGNOSIS — I2510 Atherosclerotic heart disease of native coronary artery without angina pectoris: Secondary | ICD-10-CM | POA: Diagnosis not present

## 2016-07-05 DIAGNOSIS — E1121 Type 2 diabetes mellitus with diabetic nephropathy: Secondary | ICD-10-CM | POA: Diagnosis not present

## 2016-07-05 DIAGNOSIS — E1122 Type 2 diabetes mellitus with diabetic chronic kidney disease: Secondary | ICD-10-CM | POA: Diagnosis not present

## 2016-07-05 NOTE — Telephone Encounter (Signed)
Spoke w nurse, I informed her CXR notes not reviewed but discussed radiologist impressions. Will defer to ordering provider to review and note any recommendations.   Aware we will call to notify of CXR findings. Pt appears euvolemic, just had recent increase to lasix dose, no c/o SOB.  Advised to consider URI workup by PCP with cough and nasal symptoms, as patient has had recent uptitration of lasix and she assessed and found no other evidence of fluid retention excess. [name] in agreement w this & will see if patient can be eval'd by PCP

## 2016-07-05 NOTE — Telephone Encounter (Signed)
Pt has an appt to be seen.

## 2016-07-05 NOTE — Telephone Encounter (Signed)
States that patient was diagnosised mild pulmonary edema and had increased lasix (80 mg twice a day for three days) by Cardiologist.  States patient has had cough since Sunday.  Patient is wheezy.  She was prescribe tessalon pearls yesterday and patient states is not helping.    Is afraid she might be leading to bronchitis.  Please advise.   Oxygen is is running low (88).   Patient is going to make appt for Friday to be seen.

## 2016-07-05 NOTE — Telephone Encounter (Signed)
[facility] called and wanted you to know the patients vitals:  Temp- 98.7  Heartrate- 78 Pulse-16 B/P-160/70 Wt: 147 lb Resting O2- 85%-90% drops after walking O2- 80%-85%

## 2016-07-05 NOTE — Telephone Encounter (Signed)
I requested notes from PT for documentation purposes.

## 2016-07-05 NOTE — Telephone Encounter (Signed)
New message      Pt c/o Shortness Of Breath: STAT if SOB developed within the last 24 hours or pt is noticeably SOB on the phone  1. Are you currently SOB (can you hear that pt is SOB on the phone)? Talk to the nurse 2. How long have you been experiencing SOB?  Pt has sob some on a normal basis but got worse a couple of weeks ago  3. Are you SOB when sitting or when up moving around? Nurse is not sure  4. Are you currently experiencing any other symptoms?  Coughing and wheezing

## 2016-07-05 NOTE — Telephone Encounter (Signed)
She should be seen with a CXR.  

## 2016-07-05 NOTE — Telephone Encounter (Signed)
pls advise regarding pt sx of cough x 4 days with SOB while resting

## 2016-07-06 ENCOUNTER — Ambulatory Visit (INDEPENDENT_AMBULATORY_CARE_PROVIDER_SITE_OTHER): Payer: Medicare Other | Admitting: Internal Medicine

## 2016-07-06 ENCOUNTER — Encounter (HOSPITAL_COMMUNITY): Payer: Self-pay

## 2016-07-06 ENCOUNTER — Emergency Department (HOSPITAL_COMMUNITY): Payer: Medicare Other

## 2016-07-06 ENCOUNTER — Inpatient Hospital Stay (HOSPITAL_COMMUNITY)
Admission: EM | Admit: 2016-07-06 | Discharge: 2016-07-10 | DRG: 291 | Disposition: A | Discharge status: Skilled Nursing Facility | Payer: Medicare Other | Attending: Family Medicine | Admitting: Family Medicine

## 2016-07-06 ENCOUNTER — Telehealth: Payer: Self-pay

## 2016-07-06 ENCOUNTER — Encounter: Payer: Self-pay | Admitting: Internal Medicine

## 2016-07-06 DIAGNOSIS — I482 Chronic atrial fibrillation, unspecified: Secondary | ICD-10-CM | POA: Diagnosis present

## 2016-07-06 DIAGNOSIS — R05 Cough: Secondary | ICD-10-CM

## 2016-07-06 DIAGNOSIS — R0902 Hypoxemia: Secondary | ICD-10-CM | POA: Diagnosis not present

## 2016-07-06 DIAGNOSIS — R069 Unspecified abnormalities of breathing: Secondary | ICD-10-CM | POA: Diagnosis not present

## 2016-07-06 DIAGNOSIS — K219 Gastro-esophageal reflux disease without esophagitis: Secondary | ICD-10-CM | POA: Diagnosis not present

## 2016-07-06 DIAGNOSIS — I2510 Atherosclerotic heart disease of native coronary artery without angina pectoris: Secondary | ICD-10-CM | POA: Diagnosis not present

## 2016-07-06 DIAGNOSIS — Z79899 Other long term (current) drug therapy: Secondary | ICD-10-CM

## 2016-07-06 DIAGNOSIS — I509 Heart failure, unspecified: Secondary | ICD-10-CM | POA: Diagnosis not present

## 2016-07-06 DIAGNOSIS — R059 Cough, unspecified: Secondary | ICD-10-CM | POA: Insufficient documentation

## 2016-07-06 DIAGNOSIS — Z833 Family history of diabetes mellitus: Secondary | ICD-10-CM | POA: Diagnosis not present

## 2016-07-06 DIAGNOSIS — I252 Old myocardial infarction: Secondary | ICD-10-CM | POA: Diagnosis not present

## 2016-07-06 DIAGNOSIS — Z7982 Long term (current) use of aspirin: Secondary | ICD-10-CM | POA: Diagnosis not present

## 2016-07-06 DIAGNOSIS — I5021 Acute systolic (congestive) heart failure: Secondary | ICD-10-CM

## 2016-07-06 DIAGNOSIS — R0602 Shortness of breath: Secondary | ICD-10-CM | POA: Diagnosis not present

## 2016-07-06 DIAGNOSIS — Z794 Long term (current) use of insulin: Secondary | ICD-10-CM | POA: Diagnosis not present

## 2016-07-06 DIAGNOSIS — I10 Essential (primary) hypertension: Secondary | ICD-10-CM | POA: Diagnosis present

## 2016-07-06 DIAGNOSIS — E1122 Type 2 diabetes mellitus with diabetic chronic kidney disease: Secondary | ICD-10-CM | POA: Diagnosis present

## 2016-07-06 DIAGNOSIS — Z85828 Personal history of other malignant neoplasm of skin: Secondary | ICD-10-CM | POA: Diagnosis not present

## 2016-07-06 DIAGNOSIS — J9601 Acute respiratory failure with hypoxia: Secondary | ICD-10-CM | POA: Insufficient documentation

## 2016-07-06 DIAGNOSIS — Z87891 Personal history of nicotine dependence: Secondary | ICD-10-CM

## 2016-07-06 DIAGNOSIS — E785 Hyperlipidemia, unspecified: Secondary | ICD-10-CM | POA: Diagnosis present

## 2016-07-06 DIAGNOSIS — Z96641 Presence of right artificial hip joint: Secondary | ICD-10-CM | POA: Diagnosis not present

## 2016-07-06 DIAGNOSIS — L0390 Cellulitis, unspecified: Secondary | ICD-10-CM | POA: Diagnosis not present

## 2016-07-06 DIAGNOSIS — R32 Unspecified urinary incontinence: Secondary | ICD-10-CM | POA: Diagnosis present

## 2016-07-06 DIAGNOSIS — I080 Rheumatic disorders of both mitral and aortic valves: Secondary | ICD-10-CM | POA: Diagnosis present

## 2016-07-06 DIAGNOSIS — R262 Difficulty in walking, not elsewhere classified: Secondary | ICD-10-CM

## 2016-07-06 DIAGNOSIS — Z9641 Presence of insulin pump (external) (internal): Secondary | ICD-10-CM | POA: Diagnosis not present

## 2016-07-06 DIAGNOSIS — L03116 Cellulitis of left lower limb: Secondary | ICD-10-CM | POA: Diagnosis not present

## 2016-07-06 DIAGNOSIS — E1051 Type 1 diabetes mellitus with diabetic peripheral angiopathy without gangrene: Secondary | ICD-10-CM | POA: Diagnosis not present

## 2016-07-06 DIAGNOSIS — J9600 Acute respiratory failure, unspecified whether with hypoxia or hypercapnia: Secondary | ICD-10-CM | POA: Diagnosis not present

## 2016-07-06 DIAGNOSIS — I130 Hypertensive heart and chronic kidney disease with heart failure and stage 1 through stage 4 chronic kidney disease, or unspecified chronic kidney disease: Secondary | ICD-10-CM | POA: Diagnosis not present

## 2016-07-06 DIAGNOSIS — I5033 Acute on chronic diastolic (congestive) heart failure: Secondary | ICD-10-CM

## 2016-07-06 DIAGNOSIS — N183 Chronic kidney disease, stage 3 unspecified: Secondary | ICD-10-CM | POA: Diagnosis present

## 2016-07-06 DIAGNOSIS — I5043 Acute on chronic combined systolic (congestive) and diastolic (congestive) heart failure: Secondary | ICD-10-CM | POA: Diagnosis not present

## 2016-07-06 DIAGNOSIS — Z888 Allergy status to other drugs, medicaments and biological substances status: Secondary | ICD-10-CM | POA: Diagnosis not present

## 2016-07-06 DIAGNOSIS — E1142 Type 2 diabetes mellitus with diabetic polyneuropathy: Secondary | ICD-10-CM | POA: Diagnosis not present

## 2016-07-06 DIAGNOSIS — E0842 Diabetes mellitus due to underlying condition with diabetic polyneuropathy: Secondary | ICD-10-CM | POA: Diagnosis not present

## 2016-07-06 LAB — BASIC METABOLIC PANEL
Anion gap: 10 (ref 5–15)
BUN: 43 mg/dL — AB (ref 6–20)
CHLORIDE: 99 mmol/L — AB (ref 101–111)
CO2: 28 mmol/L (ref 22–32)
CREATININE: 1.36 mg/dL — AB (ref 0.44–1.00)
Calcium: 9.9 mg/dL (ref 8.9–10.3)
GFR calc Af Amer: 39 mL/min — ABNORMAL LOW (ref >60)
GFR calc non Af Amer: 34 mL/min — ABNORMAL LOW (ref >60)
GLUCOSE: 244 mg/dL — AB (ref 65–99)
POTASSIUM: 3.9 mmol/L (ref 3.5–5.1)
SODIUM: 137 mmol/L (ref 135–145)

## 2016-07-06 LAB — CBC
HCT: 37.3 % (ref 36.0–46.0)
Hemoglobin: 11.7 g/dL — ABNORMAL LOW (ref 12.0–15.0)
MCH: 31.1 pg (ref 26.0–34.0)
MCHC: 31.4 g/dL (ref 30.0–36.0)
MCV: 99.2 fL (ref 78.0–100.0)
PLATELETS: 234 10*3/uL (ref 150–400)
RBC: 3.76 MIL/uL — AB (ref 3.87–5.11)
RDW: 19 % — AB (ref 11.5–15.5)
WBC: 6.8 10*3/uL (ref 4.0–10.5)

## 2016-07-06 LAB — I-STAT TROPONIN, ED: TROPONIN I, POC: 0 ng/mL (ref 0.00–0.08)

## 2016-07-06 LAB — TROPONIN I

## 2016-07-06 LAB — GLUCOSE, CAPILLARY: Glucose-Capillary: 269 mg/dL — ABNORMAL HIGH (ref 65–99)

## 2016-07-06 LAB — BRAIN NATRIURETIC PEPTIDE: B Natriuretic Peptide: 230.2 pg/mL — ABNORMAL HIGH (ref 0.0–100.0)

## 2016-07-06 MED ORDER — ASPIRIN EC 81 MG PO TBEC
81.0000 mg | DELAYED_RELEASE_TABLET | Freq: Two times a day (BID) | ORAL | Status: DC
  Administered 2016-07-06 – 2016-07-10 (×8): 81 mg via ORAL
  Filled 2016-07-06 (×8): qty 1

## 2016-07-06 MED ORDER — CHLORHEXIDINE GLUCONATE 0.12 % MT SOLN
15.0000 mL | Freq: Two times a day (BID) | OROMUCOSAL | Status: DC
  Administered 2016-07-06 – 2016-07-10 (×8): 15 mL via OROMUCOSAL
  Filled 2016-07-06 (×7): qty 15

## 2016-07-06 MED ORDER — ACETAMINOPHEN 325 MG PO TABS
650.0000 mg | ORAL_TABLET | ORAL | Status: DC | PRN
  Administered 2016-07-07: 650 mg via ORAL
  Filled 2016-07-06: qty 2

## 2016-07-06 MED ORDER — CEFAZOLIN IN D5W 1 GM/50ML IV SOLN
1.0000 g | Freq: Two times a day (BID) | INTRAVENOUS | Status: DC
  Administered 2016-07-06 – 2016-07-07 (×2): 1 g via INTRAVENOUS
  Filled 2016-07-06 (×4): qty 50

## 2016-07-06 MED ORDER — GABAPENTIN 100 MG PO CAPS
200.0000 mg | ORAL_CAPSULE | Freq: Every day | ORAL | Status: DC
  Administered 2016-07-06 – 2016-07-09 (×4): 200 mg via ORAL
  Filled 2016-07-06 (×5): qty 2

## 2016-07-06 MED ORDER — PANTOPRAZOLE SODIUM 40 MG PO TBEC
40.0000 mg | DELAYED_RELEASE_TABLET | Freq: Every day | ORAL | Status: DC
  Administered 2016-07-07 – 2016-07-10 (×4): 40 mg via ORAL
  Filled 2016-07-06 (×4): qty 1

## 2016-07-06 MED ORDER — FUROSEMIDE 10 MG/ML IJ SOLN
80.0000 mg | Freq: Two times a day (BID) | INTRAMUSCULAR | Status: DC
  Administered 2016-07-07: 80 mg via INTRAVENOUS
  Filled 2016-07-06: qty 8

## 2016-07-06 MED ORDER — FUROSEMIDE 10 MG/ML IJ SOLN
40.0000 mg | Freq: Once | INTRAMUSCULAR | Status: AC
  Administered 2016-07-06: 40 mg via INTRAVENOUS
  Filled 2016-07-06: qty 4

## 2016-07-06 MED ORDER — POTASSIUM CHLORIDE CRYS ER 20 MEQ PO TBCR
40.0000 meq | EXTENDED_RELEASE_TABLET | Freq: Every day | ORAL | Status: DC
  Administered 2016-07-07 – 2016-07-10 (×4): 40 meq via ORAL
  Filled 2016-07-06 (×4): qty 2

## 2016-07-06 MED ORDER — INSULIN ASPART 100 UNIT/ML ~~LOC~~ SOLN
0.0000 [IU] | Freq: Three times a day (TID) | SUBCUTANEOUS | Status: DC
  Administered 2016-07-07 (×3): 2 [IU] via SUBCUTANEOUS
  Administered 2016-07-08: 7 [IU] via SUBCUTANEOUS
  Administered 2016-07-08: 2 [IU] via SUBCUTANEOUS
  Administered 2016-07-08: 5 [IU] via SUBCUTANEOUS
  Administered 2016-07-09 (×2): 3 [IU] via SUBCUTANEOUS
  Administered 2016-07-09: 7 [IU] via SUBCUTANEOUS
  Administered 2016-07-10: 3 [IU] via SUBCUTANEOUS

## 2016-07-06 MED ORDER — INSULIN DETEMIR 100 UNIT/ML ~~LOC~~ SOLN
10.0000 [IU] | Freq: Every day | SUBCUTANEOUS | Status: DC
  Administered 2016-07-06 – 2016-07-07 (×2): 10 [IU] via SUBCUTANEOUS
  Filled 2016-07-06 (×3): qty 0.1

## 2016-07-06 MED ORDER — METOPROLOL TARTRATE 100 MG PO TABS
100.0000 mg | ORAL_TABLET | Freq: Two times a day (BID) | ORAL | Status: DC
  Administered 2016-07-06 – 2016-07-10 (×8): 100 mg via ORAL
  Filled 2016-07-06 (×8): qty 1

## 2016-07-06 MED ORDER — SODIUM CHLORIDE 0.9 % IV SOLN: 250.0000 mL | INTRAVENOUS | Status: DC | PRN

## 2016-07-06 MED ORDER — INSULIN ASPART 100 UNIT/ML ~~LOC~~ SOLN
0.0000 [IU] | Freq: Every day | SUBCUTANEOUS | Status: DC
  Administered 2016-07-06 – 2016-07-07 (×2): 3 [IU] via SUBCUTANEOUS

## 2016-07-06 MED ORDER — SODIUM CHLORIDE 0.9% FLUSH
3.0000 mL | Freq: Two times a day (BID) | INTRAVENOUS | Status: DC
  Administered 2016-07-06 – 2016-07-09 (×3): 3 mL via INTRAVENOUS

## 2016-07-06 MED ORDER — HEPARIN SODIUM (PORCINE) 5000 UNIT/ML IJ SOLN
5000.0000 [IU] | Freq: Three times a day (TID) | INTRAMUSCULAR | Status: DC
  Administered 2016-07-06 – 2016-07-10 (×11): 5000 [IU] via SUBCUTANEOUS
  Filled 2016-07-06 (×10): qty 1

## 2016-07-06 MED ORDER — AMLODIPINE BESYLATE 5 MG PO TABS
5.0000 mg | ORAL_TABLET | Freq: Every day | ORAL | Status: DC
  Administered 2016-07-06 – 2016-07-10 (×5): 5 mg via ORAL
  Filled 2016-07-06 (×5): qty 1

## 2016-07-06 MED ORDER — BENZONATATE 100 MG PO CAPS: 100.0000 mg | ORAL_CAPSULE | Freq: Three times a day (TID) | ORAL | Status: DC | PRN

## 2016-07-06 MED ORDER — ORAL CARE MOUTH RINSE
15.0000 mL | Freq: Two times a day (BID) | OROMUCOSAL | Status: DC
  Administered 2016-07-07 – 2016-07-09 (×5): 15 mL via OROMUCOSAL

## 2016-07-06 MED ORDER — SODIUM CHLORIDE 0.9% FLUSH: 3.0000 mL | INTRAVENOUS | Status: DC | PRN

## 2016-07-06 MED ORDER — PRAVASTATIN SODIUM 20 MG PO TABS
20.0000 mg | ORAL_TABLET | Freq: Every evening | ORAL | Status: DC
  Administered 2016-07-06 – 2016-07-09 (×4): 20 mg via ORAL
  Filled 2016-07-06 (×4): qty 1

## 2016-07-06 MED ORDER — ONDANSETRON HCL 4 MG/2ML IJ SOLN: 4.0000 mg | Freq: Four times a day (QID) | INTRAMUSCULAR | Status: DC | PRN

## 2016-07-06 NOTE — Progress Notes (Signed)
Pre visit review using our clinic review tool, if applicable. No additional management support is needed unless otherwise documented below in the visit note. 

## 2016-07-06 NOTE — ED Notes (Signed)
Respiratory just left bedside. Visitor at bedside. PT alert and has no requests. Visitor has no requests as well. Call bell in reach, bed rails up.

## 2016-07-06 NOTE — ED Notes (Signed)
RN attempted to wean pt from NRB; Pt desat once again to 70's; Respiratory at bedside; RN attempting to get in contact with admitting Md; Md will switch bed to step down and BiPAP orders placed;

## 2016-07-06 NOTE — Assessment & Plan Note (Addendum)
With marked desat at rest on RA, now improved to 92% with 2L; will need EMS transport to ED due to this need

## 2016-07-06 NOTE — ED Notes (Signed)
Pt started to desat in the 70's; Respiratory paged to bedside and NRB applied; sat improved to 96%;

## 2016-07-06 NOTE — ED Notes (Signed)
Pt called this RN to room for bedpan, pt. States she already had urinated on self. Bed linens changed, pericare preformed.

## 2016-07-06 NOTE — Assessment & Plan Note (Signed)
Suspect related to CHF, but cant r/o pna, pt afeb with non prod cough, but likely for cxr f/u at ED today as well; most recent cxr nov 15 with pulm edema per radiology

## 2016-07-06 NOTE — ED Notes (Signed)
RN attempted to call report; RN to call back; 

## 2016-07-06 NOTE — ED Notes (Signed)
Attempted report x1. 

## 2016-07-06 NOTE — ED Notes (Signed)
Pt. States she urinated on self, unable to measure urine output. Pt bed linen changed and pericare preformed.

## 2016-07-06 NOTE — Telephone Encounter (Signed)
Reviewed CXR, consistent with fluid overload, her lasix was increased during our last visit. She does have a small nodule shown on chest x ray that need to be followed up and potentially reimaged by PCP.

## 2016-07-06 NOTE — ED Triage Notes (Addendum)
Pt presents for evaluation of SOB. Pt. States she has had URI symptoms x 1 week with dry cough, states has noticed worsening swelling to bilateral lower extremities. Pt. Does have CHF. Pt. Saw PCP today who sent her here for evaluation of RA O2 sats 73%. Pt. 94% on 4L with EMS. Pt RA sats 86% on arrival to ED.

## 2016-07-06 NOTE — H&P (Addendum)
History and Physical    Emma Yu [id number] DOB: November 29, 1926 DOA: 07/06/2016  Referring MD/NP/PA: ER PCP: [name], MD Outpatient Specialists: [facility] Patient coming from: home  Chief Complaint: shortness of breath  HPI: Emma Yu is a 82 y.o. female with medical history significant of diastolic CHF, diabetes, atrial fibrillation. Patient was hospitalized from 10/11//17 to 06/05/2016.  Patient presented to the ER with shortness of breath. Patient states she's been having worsening swelling in her lower extremities as well as dry cough for about 1 week.  Patient's weight has increased from 137 on discharge to 154.  Patient was sent from her PCPs office when she was found to have sats in the 70s. Patient was seen by PA Almyra Deforest on 06/27/2016, he adjusted her Lasix to 80 mg every morning and 40 mg in the afternoon every other day.   Patient also recently had ABIs done for lower extremity. She states that her left leg has had increasing redness over the last several days.  SHe has pain in the leg  ED Course: In the ER she was found on x-ray to have pulmonary edema. She was given 40 mg of IV Lasix 1. Unable to determine how much urine output she had as patient is incontinent. The ER is unable to place Foley. BNP was found to be elevated.  Review of Systems: all systems reviewed, negative unless stated above in HPI   Past Medical History:  Diagnosis Date  . Acute respiratory failure ([place])    12/2011 admission - decreased O2 sats on ambulation, improved by time of discharge  . Aortic stenosis    Moderate by echo 4/13 - mean 20 mmHg  . Arthritis    "back" (08/24/2015)  . Atrial fibrillation ([place])    not a coumadin candidate secondary to fall risk  . CAD (coronary artery disease)    minimal plaque by cath 5/12  . CHF (congestive heart failure) (HCC)    EF 55-60%  . Chronic atrial fibrillation with RVR (HCC)   . Chronic diastolic heart failure (HCC)    Echocardiogram 12/10/11: Moderate LVH, EF 65-70%, moderate aortic stenosis, mean gradient 20, mild MS  . Chronic mid back pain   . DJD (degenerative joint disease) of hip    s/p R THR 10/2010  . DVT (deep venous thrombosis) ([place]) ~ 2012   LLE  . History of blood transfusion 1957   "related to childbirth"  . Hyperlipidemia   . Hypertension   . Insulin pump in place   . Iron deficiency anemia   . Kidney stones ~ 1958   "no OR"  . Migraine    "none since my hysterectomy" (08/24/2015)  . Mitral stenosis    Mild by echo 4/13  . Myocardial infarction 12/2010  . PAD (peripheral artery disease) (HCC)    s/p bilateral comon iliac artery stenting in 2002. Known significant  R SFA  disease. carotid dz noted 11/2011 followed by Dr. Bridgett Larsson  . Skin cancer    right forehead/head  . Type II diabetes mellitus ([place]) dx'd 1999  . Venous insufficiency     Past Surgical History:  Procedure Laterality Date  . ABDOMINAL HYSTERECTOMY    . APPENDECTOMY    . BACK SURGERY    . CARDIAC CATHETERIZATION  01/10/2011   No significant CAD  . CATARACT EXTRACTION, BILATERAL Bilateral 2015  . CHOLECYSTECTOMY OPEN    . ILIAC ARTERY STENT Bilateral 2002   [place]  . JOINT REPLACEMENT    .  [place] SURGERY  1999  . REVISION TOTAL HIP ARTHROPLASTY  1994  . SKIN CANCER EXCISION  2016   top of right forehead/head  . TONSILLECTOMY    . TOTAL HIP ARTHROPLASTY Right 1991     reports that she quit smoking about 30 years ago. Her smoking use included Cigarettes. She has a 74.00 pack-year smoking history. She has never used smokeless tobacco. She reports that she does not drink alcohol or use drugs.  Allergies  Allergen Reactions  . Carvedilol Other (See Comments)    Heart stops  . Augmentin [Amoxicillin-Pot Clavulanate] Diarrhea  . Clindamycin/Lincomycin Other (See Comments)    tremors  . Diltiazem Hcl Other (See Comments)    Chest pain;   07/02/14-  Patient has been able to tolerate diltiazem PO as  inpatient since 06/29/14 without any adverse reaction.  . Fenofibrate Other (See Comments)    Unknown - [facility] - MAR  . Hctz [Hydrochlorothiazide] Other (See Comments)    Chest pain  . Nisoldipine Other (See Comments)    Chest pain  . Nitroglycerin Other (See Comments)    Unknown - [facility] MAR  . Propranolol Diarrhea    Chest pain  . Septra [Sulfamethoxazole-Trimethoprim]     Rash, cyst  . Patrici Ranks Fumarate] Other (See Comments)    Unknown reaction  . Valturna [Aliskiren-Valsartan] Other (See Comments)    Unknown reaction    Family History  Problem Relation Age of Onset  . Diabetes Father   . Diabetes Other     5/8 sibs     Prior to Admission medications   Medication Sig Start Date End Date Taking? Authorizing Provider  acetaminophen (TYLENOL) 500 MG tablet Take 1,000 mg by mouth every 6 (six) hours as needed for moderate pain.    Historical Provider, MD  amLODipine (NORVASC) 5 MG tablet Take 1 tablet (5 mg total) by mouth daily. 05/04/16 08/02/16  Isaiah Serge, NP  Ascorbic Acid (VITAMIN C) 500 MG tablet Take 500 mg by mouth daily.      Historical Provider, MD  aspirin EC 81 MG tablet Take 81 mg by mouth 2 (two) times daily.    Historical Provider, MD  b complex vitamins tablet Take 1 tablet by mouth daily.      Historical Provider, MD  benzonatate (TESSALON) 100 MG capsule Take 1 capsule (100 mg total) by mouth 3 (three) times daily as needed for cough. 07/04/16   Almyra Deforest, PA  Cholecalciferol (VITAMIN D3) 2000 UNITS capsule Take 2,000 Units by mouth daily.      Historical Provider, MD  fish oil-omega-3 fatty acids 1000 MG capsule Take 2 g by mouth 2 (two) times daily.     Historical Provider, MD  furosemide (LASIX) 40 MG tablet Take 1 tablet (40 mg total) by mouth every other day. Patient taking differently: Take 40 mg by mouth daily.  06/27/16 09/25/16  Almyra Deforest, PA  furosemide (LASIX) 80 MG tablet Take 1 tablet (80 mg total) by mouth daily. 07/04/16   Almyra Deforest, PA    gabapentin (NEURONTIN) 100 MG capsule Take 2 capsules (200 mg total) by mouth at bedtime. 06/05/16   Theodis Blaze, MD  Insulin Infusion Pump Supplies (INSET INFUSION SET 23" 6MM) MISC 1 Device by Does not apply route every 3 (three) days.    Historical Provider, MD  Insulin Infusion Pump Supplies (INSULIN PUMP SYRINGE RESERVOIR) MISC 1 Device by Does not apply route every 3 (three) days. Animas 2 ml  Historical Provider, MD  insulin lispro (HUMALOG) 100 UNIT/ML injection Inject via insulin pump for a total of 70 units per day 10/06/15   Janith Lima, MD  levofloxacin (LEVAQUIN) 500 MG tablet Take 1 tablet (500 mg total) by mouth every other day. 06/07/16   Theodis Blaze, MD  metoprolol (LOPRESSOR) 100 MG tablet TAKE 1 TABLET (100 MG TOTAL) BY MOUTH 2 (TWO) TIMES DAILY. 12/29/15   Jolaine Artist, MD  omeprazole (PRILOSEC) 40 MG capsule Take 1 capsule by mouth  daily before breakfast 01/30/16   Janith Lima, MD  pravastatin (PRAVACHOL) 20 MG tablet TAKE 1 TABLET BY MOUTH IN THE EVENING 04/22/16   Janith Lima, MD  Probiotic Product (PROBIOTIC PO) Take 1 tablet by mouth daily at 10 pm.    Historical Provider, MD    Physical Exam:   Constitutional: chronically ill appearing- mild respiratory distress Vitals:   07/06/16 1745 07/06/16 1800 07/06/16 1830 07/06/16 1845  BP: (!) 144/53 (!) 156/51  165/60  Pulse: 75 78 91   Resp: 20 26 23    Temp:      TempSrc:      SpO2: (!) 86%  (!) 89%   Weight:      Height:       Eyes: PERRL, lids and conjunctivae normal ENMT: Mucous membranes are moist. Posterior pharynx clear of any exudate or lesions.Normal dentition.  Neck: normal, supple, no masses, no thyromegaly- + JVD Respiratory: diminished, no wheezing, unable to lay float Cardiovascular: irregular, mild LE edema Abdomen: no tenderness, no masses palpated. No hepatosplenomegaly. Bowel sounds positive.  Musculoskeletal: no clubbing / cyanosis. No joint deformity upper and lower  extremities. Good ROM, no contractures. Normal muscle tone.  Skin: left leg with increased redness to knee-- both legs have chronic changes Neurologic: CN 2-12 grossly intact. Sensation intact, DTR normal. Strength 5/5 in all 4.  Psychiatric: Normal judgment and insight. Alert and oriented x 3. Normal mood.    Labs on Admission: I have personally reviewed following labs and imaging studies  CBC:  Recent Labs Lab 07/04/16 1609 07/06/16 1506  WBC 7.5 6.8  HGB 12.1 11.7*  HCT 38.5 37.3  MCV 98.5 99.2  PLT 219 [id number]   Basic Metabolic Panel:  Recent Labs Lab 07/04/16 1609 07/06/16 1618  NA 139 137  K 4.7 3.9  CL 98 99*  CO2 31 28  GLUCOSE 161* 244*  BUN 32* 43*  CREATININE 1.13* 1.36*  CALCIUM 10.3 9.9   GFR: Estimated Creatinine Clearance: 27.9 mL/min (by C-G formula based on SCr of 1.36 mg/dL (H)). Liver Function Tests: No results for input(s): AST, ALT, ALKPHOS, BILITOT, PROT, ALBUMIN in the last 168 hours. No results for input(s): LIPASE, AMYLASE in the last 168 hours. No results for input(s): AMMONIA in the last 168 hours. Coagulation Profile: No results for input(s): INR, PROTIME in the last 168 hours. Cardiac Enzymes: No results for input(s): CKTOTAL, CKMB, CKMBINDEX, TROPONINI in the last 168 hours. BNP (last 3 results) No results for input(s): PROBNP in the last 8760 hours. HbA1C: No results for input(s): HGBA1C in the last 72 hours. CBG: No results for input(s): GLUCAP in the last 168 hours. Lipid Profile: No results for input(s): CHOL, HDL, LDLCALC, TRIG, CHOLHDL, LDLDIRECT in the last 72 hours. Thyroid Function Tests: No results for input(s): TSH, T4TOTAL, FREET4, T3FREE, THYROIDAB in the last 72 hours. Anemia Panel: No results for input(s): VITAMINB12, FOLATE, FERRITIN, TIBC, IRON, RETICCTPCT in the last 72 hours. Urine analysis:  Component Value Date/Time   COLORURINE YELLOW 06/03/2016 1030   APPEARANCEUR CLOUDY (A) 06/03/2016 1030   LABSPEC  1.013 06/03/2016 1030   PHURINE 5.5 06/03/2016 1030   GLUCOSEU NEGATIVE 06/03/2016 1030   GLUCOSEU NEGATIVE 08/31/2015 1613   HGBUR SMALL (A) 06/03/2016 1030   HGBUR small 08/07/2010 1418   BILIRUBINUR NEGATIVE 06/03/2016 1030   BILIRUBINUR negative 05/05/2013 1707   KETONESUR NEGATIVE 06/03/2016 1030   PROTEINUR NEGATIVE 06/03/2016 1030   UROBILINOGEN 0.2 08/31/2015 1613   NITRITE POSITIVE (A) 06/03/2016 1030   LEUKOCYTESUR MODERATE (A) 06/03/2016 1030   Sepsis Labs: Invalid input(s): PROCALCITONIN, LACTICIDVEN No results found for this or any previous visit (from the past 240 hour(s)).   Radiological Exams on Admission: Dg Chest 2 View  Result Date: 07/06/2016 CLINICAL DATA:  Shortness of breath and cough for 1 week. History of CHF and myocardial infarction. Acute respiratory failure. History of smoking. EXAM: CHEST  2 VIEW COMPARISON:  07/04/2016 FINDINGS: The heart is enlarged. Interval development of significant bilateral airspace filling opacities, more confluent at the bases and partially obscuring hemidiaphragms. Kerley B-lines are present. There are small bilateral pleural effusions. IMPRESSION: Interval development of airspace filling and interstitial prominence, consistent with congestive heart failure. Small effusions. Electronically Signed   By: Nolon Nations M.D.   On: 07/06/2016 15:38    EKG: Independently reviewed. A fib  Assessment/Plan Active Problems:   Hyperlipidemia with target LDL less than 70   Essential hypertension, benign   Diabetes mellitus type 2 with peripheral artery disease (HCC)   Cellulitis   Diabetic peripheral neuropathy (HCC)   Chronic renal insufficiency, stage III (moderate)   Chronic atrial fibrillation (HCC)   Acute on chronic diastolic heart failure (HCC)   Acute respiratory failure with hypoxia/Acute exacerbation diastolic CHF:  Acute.  -Patient with O2 sats -Chest x-ray showing pulmonary edema.  BNP elevated at 230 -echo done  10/17-- will not repeat -cycle CE - Continuous pulse oximetry with nasal cannula oxygen keep O2 sats greater than 92% - Place Foley catheter Strict ins and outs and daily - Hold Lasix po (recently increased by cardiology) - Lasix 80 mg IV every 12 hours  Cellulitis of left LE -order-set used -IV ancef -ABI done 11/10  Essential hypertension - Continue metoprolol and amlodipine  Diabetes mellitus type 2 on insulin pump: Insulin pump disconnected - Insulin pump removed - Hypoglycemic protocols - CBGs every before meals and at bedtime with amoderate sliding scale insulin - Levemir 10 units daily at bedtime - Adjust current regimen, as needed/restart insulin pump when able - Continue gabapentin   Chronic kidney disease stage III:  - Repeat BMP in a.m.  CAD - Continue aspirin  Hyperlipidemia - continue pravastatin  GERD - Continue pharmacy substitution of Protonix  Changed order to SDU As sats continued to decrease and patient was placed on Bipap  DVT prophylaxis: heparin Code Status: full Family Communication: patient Disposition Plan: suspect will be in hospital for 3-4 days Consults called: none Admission status: inpt for IV diuresis and IV Abx   [facility] Pager [phone]  If 7PM-7AM, please contact night-coverage [email or web address] Password [email or web address]  07/06/2016, 6:57 PM

## 2016-07-06 NOTE — Telephone Encounter (Signed)
Pt is scheduled today for an acute visit with Dr. Jenny Reichmann.

## 2016-07-06 NOTE — Progress Notes (Signed)
Subjective:    Patient ID: Emma Yu, female    DOB: 10-20-1926, 82 y.o.   MRN: [id number]  HPI  Here with son, 72 y.o.femalewith medical history significant of CHF, HTN, HLD, CAD, A. fib, DM type II; who presents with main concern of 4-5 days duration of progressively worsening dyspnea initially present with exertion and has progressed to dyspnea at rest in the past 1-2 days.  Has significant cough, but nonproductive, no fever, no wheezing, CP or hemoptysis. Was most recently hospd oct 2017 with diast CHF and acute resp failure, weaned off o2, d/c to rehab but unfortunately has gained wt and worsening LE edema since rehab at home, with wt usually at 145 per pt (141 when left hospital), this AM at 149.  Was seen per provider 2 days ago with start lasix 80 bid x 3 days but though "spends all her time in the BR" has not had improvement of symptoms, decresed swelling or wt loss.  CT angio chest was c/w some suspicion of PNA but not actively tx for this.  Has significant erythema to LLE as well but relatively nontender, no fever, drainage.   Was seen by Home Nurse today and advised to make appt for cough.  Pt very dissapointed about having to go back to hospital, son wondering why this is happening so soon after last hospn. Past Medical History:  Diagnosis Date  . Acute respiratory failure ([place])    12/2011 admission - decreased O2 sats on ambulation, improved by time of discharge  . Aortic stenosis    Moderate by echo 4/13 - mean 20 mmHg  . Arthritis    "back" (08/24/2015)  . Atrial fibrillation ([place])    not a coumadin candidate secondary to fall risk  . CAD (coronary artery disease)    minimal plaque by cath 5/12  . CHF (congestive heart failure) (HCC)    EF 55-60%  . Chronic atrial fibrillation with RVR (HCC)   . Chronic diastolic heart failure (HCC)    Echocardiogram 12/10/11: Moderate LVH, EF 65-70%, moderate aortic stenosis, mean gradient 20, mild MS  . Chronic mid back pain   . DJD  (degenerative joint disease) of hip    s/p R THR 10/2010  . DVT (deep venous thrombosis) ([place]) ~ 2012   LLE  . History of blood transfusion 1957   "related to childbirth"  . Hyperlipidemia   . Hypertension   . Insulin pump in place   . Iron deficiency anemia   . Kidney stones ~ 1958   "no OR"  . Migraine    "none since my hysterectomy" (08/24/2015)  . Mitral stenosis    Mild by echo 4/13  . Myocardial infarction 12/2010  . PAD (peripheral artery disease) (HCC)    s/p bilateral comon iliac artery stenting in 2002. Known significant  R SFA  disease. carotid dz noted 11/2011 followed by Dr. Bridgett Larsson  . Skin cancer    right forehead/head  . Type II diabetes mellitus ([place]) dx'd 1999  . Venous insufficiency    Past Surgical History:  Procedure Laterality Date  . ABDOMINAL HYSTERECTOMY    . APPENDECTOMY    . BACK SURGERY    . CARDIAC CATHETERIZATION  01/10/2011   No significant CAD  . CATARACT EXTRACTION, BILATERAL Bilateral 2015  . CHOLECYSTECTOMY OPEN    . ILIAC ARTERY STENT Bilateral 2002   [place]  . JOINT REPLACEMENT    . [place] SURGERY  1999  . REVISION TOTAL  HIP ARTHROPLASTY  1994  . SKIN CANCER EXCISION  2016   top of right forehead/head  . TONSILLECTOMY    . TOTAL HIP ARTHROPLASTY Right 1991    reports that she quit smoking about 30 years ago. Her smoking use included Cigarettes. She has a 74.00 pack-year smoking history. She has never used smokeless tobacco. She reports that she does not drink alcohol or use drugs. family history includes Diabetes in her father and other. Allergies  Allergen Reactions  . Carvedilol Other (See Comments)    Heart stops  . Augmentin [Amoxicillin-Pot Clavulanate] Diarrhea  . Clindamycin/Lincomycin Other (See Comments)    tremors  . Diltiazem Hcl Other (See Comments)    Chest pain;   07/02/14-  Patient has been able to tolerate diltiazem PO as inpatient since 06/29/14 without any adverse reaction.  . Fenofibrate Other (See Comments)     Unknown - [facility] - MAR  . Hctz [Hydrochlorothiazide] Other (See Comments)    Chest pain  . Nisoldipine Other (See Comments)    Chest pain  . Nitroglycerin Other (See Comments)    Unknown - NH MAR  . Propranolol Diarrhea    Chest pain  . Septra [Sulfamethoxazole-Trimethoprim]     Rash, cyst  . Patrici Ranks Fumarate] Other (See Comments)    Unknown reaction  . Valturna [Aliskiren-Valsartan] Other (See Comments)    Unknown reaction   Current Outpatient Prescriptions on File Prior to Visit  Medication Sig Dispense Refill  . acetaminophen (TYLENOL) 500 MG tablet Take 1,000 mg by mouth every 6 (six) hours as needed for moderate pain.    Marland Kitchen amLODipine (NORVASC) 5 MG tablet Take 1 tablet (5 mg total) by mouth daily. 90 tablet 3  . Ascorbic Acid (VITAMIN C) 500 MG tablet Take 500 mg by mouth daily.      Marland Kitchen aspirin EC 81 MG tablet Take 81 mg by mouth 2 (two) times daily.    Marland Kitchen b complex vitamins tablet Take 1 tablet by mouth daily.      . benzonatate (TESSALON) 100 MG capsule Take 1 capsule (100 mg total) by mouth 3 (three) times daily as needed for cough. 20 capsule 0  . Cholecalciferol (VITAMIN D3) 2000 UNITS capsule Take 2,000 Units by mouth daily.      . fish oil-omega-3 fatty acids 1000 MG capsule Take 2 g by mouth 2 (two) times daily.     . furosemide (LASIX) 40 MG tablet Take 1 tablet (40 mg total) by mouth every other day. (Patient taking differently: Take 40 mg by mouth daily. ) 30 tablet 6  . furosemide (LASIX) 80 MG tablet Take 1 tablet (80 mg total) by mouth daily. 30 tablet 9  . gabapentin (NEURONTIN) 100 MG capsule Take 2 capsules (200 mg total) by mouth at bedtime.    . Insulin Infusion Pump Supplies (INSET INFUSION SET 23" 6MM) MISC 1 Device by Does not apply route every 3 (three) days.    . Insulin Infusion Pump Supplies (INSULIN PUMP SYRINGE RESERVOIR) MISC 1 Device by Does not apply route every 3 (three) days. Animas 2 ml    . insulin lispro (HUMALOG) 100 UNIT/ML injection  Inject via insulin pump for a total of 70 units per day 70 mL 3  . levofloxacin (LEVAQUIN) 500 MG tablet Take 1 tablet (500 mg total) by mouth every other day. 4 tablet 0  . metoprolol (LOPRESSOR) 100 MG tablet TAKE 1 TABLET (100 MG TOTAL) BY MOUTH 2 (TWO) TIMES DAILY. 60 tablet 11  .  omeprazole (PRILOSEC) 40 MG capsule Take 1 capsule by mouth  daily before breakfast 90 capsule 2  . pravastatin (PRAVACHOL) 20 MG tablet TAKE 1 TABLET BY MOUTH IN THE EVENING 90 tablet 1  . Probiotic Product (PROBIOTIC PO) Take 1 tablet by mouth daily at 10 pm.     No current facility-administered medications on file prior to visit.    Review of Systems  Constitutional: Negative for unusual diaphoresis or night sweats HENT: Negative for ear swelling or discharge Eyes: Negative for worsening visual haziness  Respiratory: Negative for choking and stridor.   Gastrointestinal: Negative for distension or worsening eructation Genitourinary: Negative for retention or change in urine volume.  Musculoskeletal: Negative for other MSK pain or swelling Skin: Negative for color change and worsening wound Neurological: Negative for tremors and numbness other than noted  Psychiatric/Behavioral: Negative for decreased concentration or agitation other than above   All other system neg per pt    Objective:   Physical Exam BP (!) 170/80   Pulse 85   Temp 98.4 F (36.9 C) (Oral)   Resp 18   Wt 153 lb (69.4 kg)   SpO2 92% Comment: on 2 lpm via [place] per MD  BMI 26.26 kg/m  O2 sat 73% on arrival on RA after walking 20 ft from waiting room to VS station VS noted, non toxic Constitutional: Pt appears in no apparent distress HENT: Head: NCAT.  Right Ear: External ear normal.  Left Ear: External ear normal.  Eyes: . Pupils are equal, round, and reactive to light. Conjunctivae and EOM are normal Neck: Normal range of motion. Neck supple.  Cardiovascular: Normal rate and regular rhythm.   Pulmonary/Chest: Effort normal and  breath sounds decreased with bibasilar rales left > right, no wheezing. appreciated Abd:  Soft, NT, ND, + BS Neurological: Pt is alert. Not confused , motor grossly intact Skin: Skin is warm. No rash, 2-3+ bilat LE edema with brawny changes to LLE as well Psychiatric: Pt behavior is normal. No agitation.   CHEST  2 VIEW 07/04/2016 IMPRESSION: Cardiomegaly.  Mild pulmonary edema.  Suggestion of small nodular opacity within the right lower hemi thorax. Recommend short-term followup radiograph. If this persists after resolution of acute symptomatology, recommend correlation with chest CT.  CT ANGIOGRAPHY CHEST WITH CONTRAST 05/31/2016 IMPRESSION: 1. No evidence of significant pulmonary embolus. 2. Small bilateral pleural effusions noted. Partial consolidation of both lower lung lobes, which may reflect pulmonary edema or possibly infection. 3. Mild cardiomegaly, with biatrial enlargement. 4. Mildly enlarged mediastinal nodes, measuring up to 1.3 cm in short axis.  Transthoracic Echocardiography 05/31/2016 Study Conclusions  - Left ventricle: The cavity size was normal. Wall thickness was   increased in a pattern of mild LVH. Systolic function was   vigorous. The estimated ejection fraction was in the range of 65%   to 70%. Wall motion was normal; there were no regional wall   motion abnormalities. - Aortic valve: There was mild regurgitation. - Mitral valve: Severely calcified annulus. The findings are   consistent with mild stenosis. - Left atrium: The atrium was moderately dilated. - Right atrium: The atrium was mildly dilated. - Pericardium, extracardiac: There was a left pleural effusion.  Impressions:  - Vigorous LV systolic function; mild LVH; biatrial enlargement;   calcified aortic valve with mild AI; narrow LVOT with resting   gradient of 2 m/s; severe MAC with mild MS (mean gradient 6   mmHg); trace MR; mild TR.    Assessment & Plan:

## 2016-07-06 NOTE — ED Notes (Signed)
Pt's O2 read at 88%-90% Placed pt on 2L nasal cannula. Nurse was notified.

## 2016-07-06 NOTE — Assessment & Plan Note (Addendum)
Has failed outpt management of po lasix high dose, will need IV tx; will ask pt to go to [facility] ED  Note:  Total time for pt hx, exam, review of record with pt in the room, determination of diagnoses and plan for further eval and tx is > 40 min, with over 50% spent in coordination and counseling of patient

## 2016-07-06 NOTE — ED Notes (Signed)
Dr. Eliseo Squires aware that pt sats 88% on 5L. States will order lasix dose and monitor.

## 2016-07-06 NOTE — ED Provider Notes (Signed)
[facility] DEPT Provider Note   CSN: [id number] Arrival date & time: 07/06/16  1448     History   Chief Complaint Chief Complaint  Patient presents with  . Shortness of Breath    HPI Emma Yu is a 82 y.o. female.  Patient with congestive heart failure, respiratory failure, atrial fibrillation, aortic stenosis, DVT on aspirin presents with worsening shortness of breath and cough for the past 2 days. Recent admission for similar. No fevers or chills. Patient gets extremely weak and exertional dyspnea. Patient's had increased weekend and leg swelling unknown specific amount.      Past Medical History:  Diagnosis Date  . Acute respiratory failure ([place])    12/2011 admission - decreased O2 sats on ambulation, improved by time of discharge  . Aortic stenosis    Moderate by echo 4/13 - mean 20 mmHg  . Arthritis    "back" (08/24/2015)  . Atrial fibrillation ([place])    not a coumadin candidate secondary to fall risk  . CAD (coronary artery disease)    minimal plaque by cath 5/12  . CHF (congestive heart failure) (HCC)    EF 55-60%  . Chronic atrial fibrillation with RVR (HCC)   . Chronic diastolic heart failure (HCC)    Echocardiogram 12/10/11: Moderate LVH, EF 65-70%, moderate aortic stenosis, mean gradient 20, mild MS  . Chronic mid back pain   . DJD (degenerative joint disease) of hip    s/p R THR 10/2010  . DVT (deep venous thrombosis) ([place]) ~ 2012   LLE  . History of blood transfusion 1957   "related to childbirth"  . Hyperlipidemia   . Hypertension   . Insulin pump in place   . Iron deficiency anemia   . Kidney stones ~ 1958   "no OR"  . Migraine    "none since my hysterectomy" (08/24/2015)  . Mitral stenosis    Mild by echo 4/13  . Myocardial infarction 12/2010  . PAD (peripheral artery disease) (HCC)    s/p bilateral comon iliac artery stenting in 2002. Known significant  R SFA  disease. carotid dz noted 11/2011 followed by Dr. Bridgett Larsson  . Skin cancer    right forehead/head  . Type II diabetes mellitus ([place]) dx'd 1999  . Venous insufficiency     Patient Active Problem List   Diagnosis Date Noted  . Acute on chronic diastolic heart failure ([place]) 07/06/2016  . Acute respiratory failure with hypoxia ([place]) 07/06/2016  . Cough 07/06/2016  . Dyspnea   . Diabetes mellitus type 2 in nonobese ([place]) 05/31/2016  . Acute on chronic congestive heart failure ([place])   . CHF exacerbation ([place]) 05/30/2016  . Acute respiratory failure ([place]) 05/30/2016  . Decubitus ulcer of buttock, stage 1 10/06/2015  . Other seasonal allergic rhinitis 08/31/2015  . PAD (peripheral artery disease) ([place]) 07/05/2015  . Routine general medical examination at a health care facility 11/08/2014  . Chronic diastolic heart failure ([place]) 09/13/2014  . SOB (shortness of breath)   . Chronic atrial fibrillation ([place]) 06/29/2014  . Chronic renal insufficiency, stage III (moderate) 06/16/2014  . Diabetic peripheral neuropathy ([place]) 12/21/2013  . Back pain, chronic 06/18/2013  . Mitral stenosis   . Aortic stenosis-moderate   . CAD-minimal 2012   . Diabetes mellitus type 1 with peripheral artery disease ([place]) 10/22/2011  . Chronic venous insufficiency 11/04/2009  . Hyperlipidemia with target LDL less than 70 08/04/2009  . Essential hypertension, benign 08/04/2009  . Atherosclerotic peripheral vascular disease with ulceration (  [place]) 08/04/2009  . Osteoarthritis 08/04/2009    Past Surgical History:  Procedure Laterality Date  . ABDOMINAL HYSTERECTOMY    . APPENDECTOMY    . BACK SURGERY    . CARDIAC CATHETERIZATION  01/10/2011   No significant CAD  . CATARACT EXTRACTION, BILATERAL Bilateral 2015  . CHOLECYSTECTOMY OPEN    . ILIAC ARTERY STENT Bilateral 2002   [place]  . JOINT REPLACEMENT    . [place] SURGERY  1999  . REVISION TOTAL HIP ARTHROPLASTY  1994  . SKIN CANCER EXCISION  2016   top of right forehead/head  . TONSILLECTOMY    . TOTAL HIP ARTHROPLASTY Right  1991    OB History    No data available       Home Medications    Prior to Admission medications   Medication Sig Start Date End Date Taking? Authorizing Provider  acetaminophen (TYLENOL) 500 MG tablet Take 1,000 mg by mouth every 6 (six) hours as needed for moderate pain.    Historical Provider, MD  amLODipine (NORVASC) 5 MG tablet Take 1 tablet (5 mg total) by mouth daily. 05/04/16 08/02/16  Isaiah Serge, NP  Ascorbic Acid (VITAMIN C) 500 MG tablet Take 500 mg by mouth daily.      Historical Provider, MD  aspirin EC 81 MG tablet Take 81 mg by mouth 2 (two) times daily.    Historical Provider, MD  b complex vitamins tablet Take 1 tablet by mouth daily.      Historical Provider, MD  benzonatate (TESSALON) 100 MG capsule Take 1 capsule (100 mg total) by mouth 3 (three) times daily as needed for cough. 07/04/16   Almyra Deforest, PA  Cholecalciferol (VITAMIN D3) 2000 UNITS capsule Take 2,000 Units by mouth daily.      Historical Provider, MD  fish oil-omega-3 fatty acids 1000 MG capsule Take 2 g by mouth 2 (two) times daily.     Historical Provider, MD  furosemide (LASIX) 40 MG tablet Take 1 tablet (40 mg total) by mouth every other day. Patient taking differently: Take 40 mg by mouth daily.  06/27/16 09/25/16  Almyra Deforest, PA  furosemide (LASIX) 80 MG tablet Take 1 tablet (80 mg total) by mouth daily. 07/04/16   Almyra Deforest, PA  gabapentin (NEURONTIN) 100 MG capsule Take 2 capsules (200 mg total) by mouth at bedtime. 06/05/16   Theodis Blaze, MD  Insulin Infusion Pump Supplies (INSET INFUSION SET 23" 6MM) MISC 1 Device by Does not apply route every 3 (three) days.    Historical Provider, MD  Insulin Infusion Pump Supplies (INSULIN PUMP SYRINGE RESERVOIR) MISC 1 Device by Does not apply route every 3 (three) days. [place] 2 ml    Historical Provider, MD  insulin lispro (HUMALOG) 100 UNIT/ML injection Inject via insulin pump for a total of 70 units per day 10/06/15   Janith Lima, MD  levofloxacin  (LEVAQUIN) 500 MG tablet Take 1 tablet (500 mg total) by mouth every other day. 06/07/16   Theodis Blaze, MD  metoprolol (LOPRESSOR) 100 MG tablet TAKE 1 TABLET (100 MG TOTAL) BY MOUTH 2 (TWO) TIMES DAILY. 12/29/15   Jolaine Artist, MD  omeprazole (PRILOSEC) 40 MG capsule Take 1 capsule by mouth  daily before breakfast 01/30/16   Janith Lima, MD  pravastatin (PRAVACHOL) 20 MG tablet TAKE 1 TABLET BY MOUTH IN THE EVENING 04/22/16   Janith Lima, MD  Probiotic Product (PROBIOTIC PO) Take 1 tablet by mouth daily  at 10 pm.    Historical Provider, MD    Family History Family History  Problem Relation Age of Onset  . Diabetes Father   . Diabetes Other     5/8 sibs    Social History Social History  Substance Use Topics  . Smoking status: Former Smoker    Packs/day: 2.00    Years: 37.00    Types: Cigarettes    Quit date: 08/20/1985  . Smokeless tobacco: Never Used  . Alcohol use No     Allergies   Carvedilol; Augmentin [amoxicillin-pot clavulanate]; Clindamycin/lincomycin; Diltiazem hcl; Fenofibrate; Hctz [hydrochlorothiazide]; Nisoldipine; Nitroglycerin; Propranolol; Septra [sulfamethoxazole-trimethoprim]; Tekturna [aliskiren fumarate]; and Valturna [aliskiren-valsartan]   Review of Systems Review of Systems  Constitutional: Positive for fatigue. Negative for chills and fever.  HENT: Negative for congestion.   Eyes: Negative for visual disturbance.  Respiratory: Positive for cough and shortness of breath.   Cardiovascular: Negative for chest pain.  Gastrointestinal: Negative for abdominal pain and vomiting.  Genitourinary: Negative for dysuria and flank pain.  Musculoskeletal: Negative for back pain, neck pain and neck stiffness.  Skin: Negative for rash.  Neurological: Negative for light-headedness and headaches.     Physical Exam Updated Vital Signs BP (!) 153/53 (BP Location: Right Arm)   Pulse 81   Temp 98.6 F (37 C) (Oral)   Resp 21   Ht 5\' 4"  (1.626 m)   Wt  153 lb (69.4 kg)   SpO2 92%   BMI 26.26 kg/m   Physical Exam  Constitutional: She appears well-developed and well-nourished.  HENT:  Head: Normocephalic and atraumatic.  Eyes: Conjunctivae are normal.  Neck: Neck supple.  Cardiovascular: Normal rate and regular rhythm.   Pulmonary/Chest: Effort normal. No respiratory distress. She has rales (mild crackles at bases).  Abdominal: Soft. There is no tenderness.  Musculoskeletal: She exhibits no edema.  Neurological: [name] alert.  Skin: Skin is warm and dry.  Psychiatric: She has a normal mood and affect.  Nursing note and vitals reviewed.    ED Treatments / Results  Labs (all labs ordered are listed, but only abnormal results are displayed) Labs Reviewed  CBC - Abnormal; Notable for the following:       Result Value   RBC 3.76 (*)    Hemoglobin 11.7 (*)    RDW 19.0 (*)    All other components within normal limits  BASIC METABOLIC PANEL  BRAIN NATRIURETIC PEPTIDE  I-STAT TROPOININ, ED    EKG  EKG Interpretation None       Radiology Dg Chest 2 View  Result Date: 07/06/2016 CLINICAL DATA:  Shortness of breath and cough for 1 week. History of CHF and myocardial infarction. Acute respiratory failure. History of smoking. EXAM: CHEST  2 VIEW COMPARISON:  07/04/2016 FINDINGS: The heart is enlarged. Interval development of significant bilateral airspace filling opacities, more confluent at the bases and partially obscuring hemidiaphragms. Kerley B-lines are present. There are small bilateral pleural effusions. IMPRESSION: Interval development of airspace filling and interstitial prominence, consistent with congestive heart failure. Small effusions. Electronically Signed   By: Nolon Nations M.D.   On: 07/06/2016 15:38   Dg Chest 2 View  Result Date: 07/05/2016 CLINICAL DATA:  Patient with cough and increased shortness of breath. EXAM: CHEST  2 VIEW COMPARISON:  Chest radiograph 06/01/2016. FINDINGS: Stable enlarged cardiac  and mediastinal contours with tortuosity and calcification of the thoracic aorta. Pulmonary vascular redistribution. Mild pulmonary edema. Suggestion of small nodular opacity within the right lower hemi thorax.  No large area of pulmonary consolidation. No pleural effusion or pneumothorax. Mid thoracic spine degenerative changes. IMPRESSION: Cardiomegaly.  Mild pulmonary edema. Suggestion of small nodular opacity within the right lower hemi thorax. Recommend short-term followup radiograph. If this persists after resolution of acute symptomatology, recommend correlation with chest CT. Electronically Signed   By: Lovey Newcomer M.D.   On: 07/05/2016 08:17    Procedures Procedures (including critical care time)  Medications Ordered in ED Medications  furosemide (LASIX) injection 40 mg (not administered)     Initial Impression / Assessment and Plan / ED Course  I have reviewed the triage vital signs and the nursing notes.  Pertinent labs & imaging results that were available during my care of the patient were reviewed by me and considered in my medical decision making (see chart for details).  Clinical Course    Patient presents with worsening dyspnea concern clinically for acute congestive heart failure. Plan for blood work, chest x-ray reviewed consistent with pulmonary edema. Lasix IV ordered. Plan for telemetry admission. Patient requiring nasal cannula oxygenation.  Final Clinical Impressions(s) / ED Diagnoses   Final diagnoses:  None    New Prescriptions New Prescriptions   No medications on file     Elnora Morrison, MD 07/09/16 [phone]

## 2016-07-06 NOTE — Telephone Encounter (Signed)
OT called and requested verbal order for OT 2 times a week for 2 weeks. Verbal okay given.

## 2016-07-06 NOTE — ED Notes (Signed)
Pt O2 increased to 5L per Dr Eliseo Squires at bedside.

## 2016-07-06 NOTE — Patient Instructions (Signed)
Please keep the Oxygen going at 2L   Please go now with EMS to [facility] for further evaluation and treatment

## 2016-07-07 DIAGNOSIS — E785 Hyperlipidemia, unspecified: Secondary | ICD-10-CM

## 2016-07-07 DIAGNOSIS — I5043 Acute on chronic combined systolic (congestive) and diastolic (congestive) heart failure: Secondary | ICD-10-CM

## 2016-07-07 DIAGNOSIS — I482 Chronic atrial fibrillation: Secondary | ICD-10-CM

## 2016-07-07 DIAGNOSIS — I10 Essential (primary) hypertension: Secondary | ICD-10-CM

## 2016-07-07 LAB — BASIC METABOLIC PANEL
Anion gap: 9 (ref 5–15)
BUN: 39 mg/dL — AB (ref 6–20)
CHLORIDE: 101 mmol/L (ref 101–111)
CO2: 30 mmol/L (ref 22–32)
CREATININE: 1.31 mg/dL — AB (ref 0.44–1.00)
Calcium: 9.4 mg/dL (ref 8.9–10.3)
GFR calc Af Amer: 41 mL/min — ABNORMAL LOW (ref >60)
GFR calc non Af Amer: 35 mL/min — ABNORMAL LOW (ref >60)
GLUCOSE: 194 mg/dL — AB (ref 65–99)
POTASSIUM: 3.8 mmol/L (ref 3.5–5.1)
SODIUM: 140 mmol/L (ref 135–145)

## 2016-07-07 LAB — GLUCOSE, CAPILLARY
GLUCOSE-CAPILLARY: 170 mg/dL — AB (ref 65–99)
GLUCOSE-CAPILLARY: 183 mg/dL — AB (ref 65–99)
GLUCOSE-CAPILLARY: 191 mg/dL — AB (ref 65–99)
Glucose-Capillary: 298 mg/dL — ABNORMAL HIGH (ref 65–99)

## 2016-07-07 LAB — MRSA PCR SCREENING: MRSA by PCR: POSITIVE — AB

## 2016-07-07 LAB — TROPONIN I
Troponin I: 0.03 ng/mL (ref <0.03)
Troponin I: <0.03 ng/mL (ref <0.03)

## 2016-07-07 MED ORDER — CHLORHEXIDINE GLUCONATE CLOTH 2 % EX PADS
6.0000 | MEDICATED_PAD | Freq: Every day | CUTANEOUS | Status: DC
  Administered 2016-07-07 – 2016-07-10 (×4): 6 via TOPICAL

## 2016-07-07 MED ORDER — CEFAZOLIN IN D5W 1 GM/50ML IV SOLN
1.0000 g | Freq: Two times a day (BID) | INTRAVENOUS | Status: DC
  Administered 2016-07-08 – 2016-07-09 (×5): 1 g via INTRAVENOUS
  Filled 2016-07-07 (×8): qty 50

## 2016-07-07 MED ORDER — MUPIROCIN 2 % EX OINT
1.0000 "application " | TOPICAL_OINTMENT | Freq: Two times a day (BID) | CUTANEOUS | Status: DC
  Administered 2016-07-07 – 2016-07-10 (×7): 1 via NASAL
  Filled 2016-07-07 (×4): qty 22

## 2016-07-07 MED ORDER — FUROSEMIDE 10 MG/ML IJ SOLN
40.0000 mg | Freq: Two times a day (BID) | INTRAMUSCULAR | Status: DC
  Administered 2016-07-07 – 2016-07-09 (×5): 40 mg via INTRAVENOUS
  Filled 2016-07-07 (×5): qty 4

## 2016-07-07 NOTE — Progress Notes (Signed)
PROGRESS NOTE    Emma Yu  [id number]  DOB: 1926-09-05  DOA: 07/06/2016 PCP: [name], MD Outpatient Specialists:   Hospital course: HPI: Emma Yu is a 82 y.o. female with medical history significant of diastolic CHF, diabetes, atrial fibrillation. Patient was hospitalized from 10/11//17 to 06/05/2016.  Patient presented to the ER with shortness of breath. Patient states she's been having worsening swelling in her lower extremities as well as dry cough for about 1 week.  Patient's weight has increased from 137 on discharge to 154. Patient was sent from her PCPs office when she was found to have sats in the 70s. Patient was seen by PA Almyra Deforest on 06/27/2016, he adjusted her Lasix to 80 mg every morning and 40 mg in the afternoon every other day.     Assessment & Plan:   Acute respiratory failure with hypoxia/Acute exacerbation diastolic CHF: Acute.  -Patient with O2 sats -Chest x-ray showing pulmonary edema.  BNP elevated at 230 -echo done 10/17-- will not repeat -cycle CE: negative for acute ischemia - Continuous pulse oximetry with nasal cannula oxygen keep O2 sats greater than 92% - Place Foley catheter Strict ins and outs and daily - Hold Lasix po (recently increased by cardiology) - Lasix 40 mg IV every 12 hours - try to wean off bipap today to [place] if tolerated, continue bipap prn.   Cellulitis of left LE -order-set used -IV ancef -ABI done 11/10  Essential hypertension - Continue metoprolol and amlodipine  Diabetes mellitus type 2 on insulin pump: Insulin pump disconnected - Insulin pump removed - Hypoglycemic protocols - CBGs every before meals and at bedtime with amoderate sliding scale insulin - Levemir 10 units daily at bedtime - Adjust current regimen,as needed/restart insulin pump when able - Continue gabapentin   CBG (last 3)   Recent Labs  07/06/16 2136 07/07/16 0754 07/07/16 1156  GLUCAP 269* 170* 183*   Chronic  kidney disease stage III:  - Following BMP with diuresis.  CAD - Continue aspirin  Hyperlipidemia -continue pravastatin  GERD - Continue pharmacy substitution of Protonix   DVT prophylaxis: heparin Code Status: full Family Communication: patient Disposition Plan: suspect will be in hospital for 3-4 days Consults called: none Admission status: inpt for IV diuresis and IV Abx  Subjective: Pt wants to eat and drink  Objective: Vitals:   07/07/16 0400 07/07/16 0444 07/07/16 0500 07/07/16 0801  BP: (!) 116/46  (!) 111/97 (!) 143/83  Pulse: 74  72 72  Resp: 20  (!) 21 (!) 21  Temp: 97.7 F (36.5 C)     TempSrc: Axillary     SpO2: 96%  98% 99%  Weight:  67.8 kg (149 lb 7.6 oz)    Height:        Intake/Output Summary (Last 24 hours) at 07/07/16 1029 Last data filed at 07/07/16 0500  Gross per 24 hour  Intake              678 ml  Output             1750 ml  Net            -1072 ml   Filed Weights   07/06/16 2128 07/06/16 2138 07/07/16 0444  Weight: 67.4 kg (148 lb 9.4 oz) 67.4 kg (148 lb 9.4 oz) 67.8 kg (149 lb 7.6 oz)    Exam:  General exam: awake, alert, no distress, cooperative.  BIPAP on Respiratory system: crackles bibasilar.  No increased work of  breathing. Cardiovascular system: S1 & S2 heard,  No JVD, murmurs, gallops, clicks or pedal edema. Gastrointestinal system: Abdomen is nondistended, soft and nontender. Normal bowel sounds heard. Central nervous system: Alert and oriented. No focal neurological deficits. Extremities: no cyanosis, trace pretibial edema bilateral.  Data Reviewed: Basic Metabolic Panel:  Recent Labs Lab 07/04/16 1609 07/06/16 1618 07/07/16 0236  NA 139 137 140  K 4.7 3.9 3.8  CL 98 99* 101  CO2 31 28 30   GLUCOSE 161* 244* 194*  BUN 32* 43* 39*  CREATININE 1.13* 1.36* 1.31*  CALCIUM 10.3 9.9 9.4   Liver Function Tests: No results for input(s): AST, ALT, ALKPHOS, BILITOT, PROT, ALBUMIN in the last 168 hours. No  results for input(s): LIPASE, AMYLASE in the last 168 hours. No results for input(s): AMMONIA in the last 168 hours. CBC:  Recent Labs Lab 07/04/16 1609 07/06/16 1506  WBC 7.5 6.8  HGB 12.1 11.7*  HCT 38.5 37.3  MCV 98.5 99.2  PLT 219 234   Cardiac Enzymes:  Recent Labs Lab 07/06/16 2149 07/07/16 0236 07/07/16 0849  TROPONINI <0.03 0.03* <0.03   CBG (last 3)   Recent Labs  07/06/16 2136 07/07/16 0754  GLUCAP 269* 170*   Recent Results (from the past 240 hour(s))  MRSA PCR Screening     Status: Abnormal   Collection Time: 07/06/16  9:43 PM  Result Value Ref Range Status   MRSA by PCR MRSA POS (A) NEGATIVE Final    Comment: RESULT CALLED TO, READ BACK BY AND VERIFIED WITH: B CUMMINGS,RN @0427  07/07/16 MKELLY,MLT        The GeneXpert MRSA Assay (FDA approved for NASAL specimens only), is one component of a comprehensive MRSA colonization surveillance program. It is not intended to diagnose MRSA infection nor to guide or monitor treatment for MRSA infections.     Studies: Dg Chest 2 View  Result Date: 07/06/2016 CLINICAL DATA:  Shortness of breath and cough for 1 week. History of CHF and myocardial infarction. Acute respiratory failure. History of smoking. EXAM: CHEST  2 VIEW COMPARISON:  07/04/2016 FINDINGS: The heart is enlarged. Interval development of significant bilateral airspace filling opacities, more confluent at the bases and partially obscuring hemidiaphragms. Kerley B-lines are present. There are small bilateral pleural effusions. IMPRESSION: Interval development of airspace filling and interstitial prominence, consistent with congestive heart failure. Small effusions. Electronically Signed   By: Nolon Nations M.D.   On: 07/06/2016 15:38   Scheduled Meds: . amLODipine  5 mg Oral Daily  . aspirin EC  81 mg Oral BID  .  ceFAZolin (ANCEF) IV  1 g Intravenous Q12H  . chlorhexidine  15 mL Mouth Rinse BID  . Chlorhexidine Gluconate Cloth  6 each  Topical Q0600  . furosemide  40 mg Intravenous BID  . gabapentin  200 mg Oral QHS  . heparin  5,000 Units Subcutaneous Q8H  . insulin aspart  0-5 Units Subcutaneous QHS  . insulin aspart  0-9 Units Subcutaneous TID WC  . insulin detemir  10 Units Subcutaneous QHS  . mouth rinse  15 mL Mouth Rinse q12n4p  . metoprolol  100 mg Oral BID  . mupirocin ointment  1 application Nasal BID  . pantoprazole  40 mg Oral Daily  . potassium chloride  40 mEq Oral Daily  . pravastatin  20 mg Oral QPM  . sodium chloride flush  3 mL Intravenous Q12H   Continuous Infusions:  Active Problems:   Hyperlipidemia with target LDL less than  29   Essential hypertension, benign   Diabetes mellitus type 1 with peripheral artery disease (HCC)   Cellulitis   Diabetic peripheral neuropathy (HCC)   Chronic renal insufficiency, stage III (moderate)   Chronic atrial fibrillation (HCC)   Acute on chronic diastolic heart failure (HCC)   CHF (congestive heart failure) ([place])  Time spent:   Irwin Brakeman, MD, FAAFP [facility] Pager [phone]  If 7PM-7AM, please contact night-coverage [email or web address] Password [email or web address] 07/07/2016, 10:29 AM    LOS: 1 day

## 2016-07-07 NOTE — Progress Notes (Signed)
CRITICAL VALUE ALERT  Critical value received:  Troponin 0.03   Date of notification:  07/07/16  Time of notification:  4:14 AM  Critical value read back:Yes.    Nurse who received alert:  Levonne Hubert   MD notified (1st page):  Walden Field  Time of first page:  4:15 AM   MD notified (2nd page):  Time of second page:  Responding MD:    Time MD responded:

## 2016-07-07 NOTE — Progress Notes (Signed)
tookj pt off bipap and placed on 3L [place]   Sats 94%

## 2016-07-08 DIAGNOSIS — J9600 Acute respiratory failure, unspecified whether with hypoxia or hypercapnia: Secondary | ICD-10-CM

## 2016-07-08 LAB — GLUCOSE, CAPILLARY
Glucose-Capillary: 174 mg/dL — ABNORMAL HIGH (ref 65–99)
Glucose-Capillary: 342 mg/dL — ABNORMAL HIGH (ref 65–99)
Glucose-Capillary: 264 mg/dL — ABNORMAL HIGH (ref 65–99)
Glucose-Capillary: 162 mg/dL — ABNORMAL HIGH (ref 65–99)

## 2016-07-08 LAB — BASIC METABOLIC PANEL
Anion gap: 9 (ref 5–15)
BUN: 31 mg/dL — AB (ref 6–20)
CALCIUM: 10 mg/dL (ref 8.9–10.3)
CO2: 32 mmol/L (ref 22–32)
CREATININE: 1.11 mg/dL — AB (ref 0.44–1.00)
Chloride: 102 mmol/L (ref 101–111)
GFR calc Af Amer: 50 mL/min — ABNORMAL LOW (ref >60)
GFR calc non Af Amer: 43 mL/min — ABNORMAL LOW (ref >60)
Glucose, Bld: 215 mg/dL — ABNORMAL HIGH (ref 65–99)
Potassium: 3.8 mmol/L (ref 3.5–5.1)
Sodium: 143 mmol/L (ref 135–145)

## 2016-07-08 MED ORDER — INSULIN ASPART 100 UNIT/ML ~~LOC~~ SOLN
3.0000 [IU] | Freq: Three times a day (TID) | SUBCUTANEOUS | Status: DC
  Administered 2016-07-08 (×2): 3 [IU] via SUBCUTANEOUS

## 2016-07-08 MED ORDER — INSULIN DETEMIR 100 UNIT/ML ~~LOC~~ SOLN
14.0000 [IU] | Freq: Every day | SUBCUTANEOUS | Status: DC
  Administered 2016-07-08: 14 [IU] via SUBCUTANEOUS
  Filled 2016-07-08 (×2): qty 0.14

## 2016-07-08 NOTE — Progress Notes (Signed)
PROGRESS NOTE    Emma Yu  [id number]  DOB: 03-Jul-1927  DOA: 07/06/2016 PCP: [name], MD Outpatient Specialists:   Hospital course: HPI: Emma Yu is a 82 y.o. female with medical history significant of diastolic CHF, diabetes, atrial fibrillation. Patient was hospitalized from 10/11//17 to 06/05/2016.  Patient presented to the ER with shortness of breath. Patient states she's been having worsening swelling in her lower extremities as well as dry cough for about 1 week.  Patient's weight has increased from 137 on discharge to 154. Patient was sent from her PCPs office when she was found to have sats in the 70s. Patient was seen by PA Almyra Deforest on 06/27/2016, he adjusted her Lasix to 80 mg every morning and 40 mg in the afternoon every other day.    Assessment & Plan:   Acute respiratory failure with hypoxia/Acute exacerbation diastolic CHF: Acute.  -Patient with O2 sats -Chest x-ray showing pulmonary edema.  BNP elevated at 230 -echo done 10/17-- will not repeat -cycle CE: negative for acute ischemia - Continuous pulse oximetry with nasal cannula oxygen keep O2 sats greater than 92% - Place Foley catheter Strict ins and outs and daily - Hold Lasix po (recently increased by cardiology) - Lasix 40 mg IV every 12 hours - weaning oxygen [place] as tolerated, ?if pt will go home on oxygen   Cellulitis of left LE -order-set used -IV ancef -ABI done 11/10  Essential hypertension - Continue metoprolol and amlodipine  Diabetes mellitus type 2 on insulin pump: Insulin pump disconnected - Insulin pump removed - Hypoglycemic protocols - CBGs every before meals and at bedtime with amoderate sliding scale insulin - Levemir 14 units daily at bedtime plus sliding scale coverage - Adjust current regimen,as needed/restart insulin pump when able - Continue gabapentin   CBG (last 3)   Recent Labs  07/07/16 2058 07/08/16 0746 07/08/16 1201  GLUCAP 298*  174* 342*   Chronic kidney disease stage III:  - Following BMP with diuresis.  CAD - Continue aspirin  Hyperlipidemia -continue pravastatin  GERD - Continue pharmacy substitution of Protonix  DVT prophylaxis: heparin Code Status: full Family Communication: patient Disposition Plan: suspect will be in hospital for a couple more days Consults called: none Admission status: inpt for IV diuresis and IV Abx  Subjective: Pt was able to ambulate in room today.  Still requiring supplemental oxygen  Objective: Vitals:   07/08/16 0653 07/08/16 0700 07/08/16 1129 07/08/16 1202  BP:  (!) 152/63  (!) 155/56  Pulse:   87 70  Resp:  17  18  Temp: 98.7 F (37.1 C)   98.1 F (36.7 C)  TempSrc: Axillary   Oral  SpO2:  93% 97% 98%  Weight:      Height:        Intake/Output Summary (Last 24 hours) at 07/08/16 1255 Last data filed at 07/08/16 0900  Gross per 24 hour  Intake              530 ml  Output             2200 ml  Net            -1670 ml   Filed Weights   07/06/16 2138 07/07/16 0444 07/08/16 0459  Weight: 67.4 kg (148 lb 9.4 oz) 67.8 kg (149 lb 7.6 oz) 63.8 kg (140 lb 10.5 oz)   Exam:  General exam: awake, alert, no distress, cooperative.  BIPAP on Respiratory system: crackles bibasilar.  No increased work of breathing. Cardiovascular system: S1 & S2 heard,  No JVD, murmurs, gallops, clicks or pedal edema. Gastrointestinal system: Abdomen is nondistended, soft and nontender. Normal bowel sounds heard. Central nervous system: Alert and oriented. No focal neurological deficits. Extremities: no cyanosis, trace pretibial edema bilateral.  Data Reviewed: Basic Metabolic Panel:  Recent Labs Lab 07/04/16 1609 07/06/16 1618 07/07/16 0236 07/08/16 0354  NA 139 137 140 143  K 4.7 3.9 3.8 3.8  CL 98 99* 101 102  CO2 31 28 30  32  GLUCOSE 161* 244* 194* 215*  BUN 32* 43* 39* 31*  CREATININE 1.13* 1.36* 1.31* 1.11*  CALCIUM 10.3 9.9 9.4 10.0   Liver Function  Tests: No results for input(s): AST, ALT, ALKPHOS, BILITOT, PROT, ALBUMIN in the last 168 hours. No results for input(s): LIPASE, AMYLASE in the last 168 hours. No results for input(s): AMMONIA in the last 168 hours. CBC:  Recent Labs Lab 07/04/16 1609 07/06/16 1506  WBC 7.5 6.8  HGB 12.1 11.7*  HCT 38.5 37.3  MCV 98.5 99.2  PLT 219 234   Cardiac Enzymes:  Recent Labs Lab 07/06/16 2149 07/07/16 0236 07/07/16 0849  TROPONINI <0.03 0.03* <0.03   CBG (last 3)   Recent Labs  07/07/16 2058 07/08/16 0746 07/08/16 1201  GLUCAP 298* 174* 342*   Recent Results (from the past 240 hour(s))  MRSA PCR Screening     Status: Abnormal   Collection Time: 07/06/16  9:43 PM  Result Value Ref Range Status   MRSA by PCR MRSA POS (A) NEGATIVE Final    Comment: RESULT CALLED TO, READ BACK BY AND VERIFIED WITH: B CUMMINGS,RN @0427  07/07/16 MKELLY,MLT        The GeneXpert MRSA Assay (FDA approved for NASAL specimens only), is one component of a comprehensive MRSA colonization surveillance program. It is not intended to diagnose MRSA infection nor to guide or monitor treatment for MRSA infections.     Studies: Dg Chest 2 View  Result Date: 07/06/2016 CLINICAL DATA:  Shortness of breath and cough for 1 week. History of CHF and myocardial infarction. Acute respiratory failure. History of smoking. EXAM: CHEST  2 VIEW COMPARISON:  07/04/2016 FINDINGS: The heart is enlarged. Interval development of significant bilateral airspace filling opacities, more confluent at the bases and partially obscuring hemidiaphragms. Kerley B-lines are present. There are small bilateral pleural effusions. IMPRESSION: Interval development of airspace filling and interstitial prominence, consistent with congestive heart failure. Small effusions. Electronically Signed   By: Nolon Nations M.D.   On: 07/06/2016 15:38   Scheduled Meds: . amLODipine  5 mg Oral Daily  . aspirin EC  81 mg Oral BID  .  ceFAZolin  (ANCEF) IV  1 g Intravenous Q12H  . chlorhexidine  15 mL Mouth Rinse BID  . Chlorhexidine Gluconate Cloth  6 each Topical Q0600  . furosemide  40 mg Intravenous BID  . gabapentin  200 mg Oral QHS  . heparin  5,000 Units Subcutaneous Q8H  . insulin aspart  0-5 Units Subcutaneous QHS  . insulin aspart  0-9 Units Subcutaneous TID WC  . insulin aspart  3 Units Subcutaneous TID WC  . insulin detemir  14 Units Subcutaneous QHS  . mouth rinse  15 mL Mouth Rinse q12n4p  . metoprolol  100 mg Oral BID  . mupirocin ointment  1 application Nasal BID  . pantoprazole  40 mg Oral Daily  . potassium chloride  40 mEq Oral Daily  . pravastatin  20 mg  Oral QPM  . sodium chloride flush  3 mL Intravenous Q12H   Continuous Infusions:  Principal Problem:   Acute on chronic diastolic heart failure (HCC) Active Problems:   Hyperlipidemia with target LDL less than 70   Essential hypertension, benign   Diabetes mellitus type 1 with peripheral artery disease (HCC)   Cellulitis   Diabetic peripheral neuropathy (HCC)   Chronic renal insufficiency, stage III (moderate)   Chronic atrial fibrillation (HCC)   Acute respiratory failure (HCC)   Acute respiratory failure with hypoxia (HCC)   Acute CHF (congestive heart failure) (HCC)   CHF (congestive heart failure) ([place])  Time spent:   Irwin Brakeman, MD, FAAFP [facility] Pager [phone]  If 7PM-7AM, please contact night-coverage [email or web address] Password [email or web address] 07/08/2016, 12:55 PM    LOS: 2 days

## 2016-07-08 NOTE — Evaluation (Signed)
Physical Therapy Evaluation Patient Details Name: Emma Yu MRN: [id number] DOB: [birth year] Today's Date: 07/08/2016   History of Present Illness  82 yo admitted with SOB, heart failure and LLE cellulitis. PMHx: CHF, DM, AFib, Rt THA  Clinical Impression  Pt pleasant and very willing to mobilize and be OOB today. Pt reports having only been home 2 weeks from a stay at [facility]. Pt does not have 24hr assist, oxygen saturations dropped to 80 on brief sitting on RA and on 4L was able to maintain sats >90% throughout but on 3L dropped to 87%. Pt with impaired ability with transfers, gait, activity tolerance and strength with limited caregiver availability who will benefit from acute therapy to maximize independence and function to decrease burden of care.     Follow Up Recommendations Supervision/Assistance - 24 hour;SNF    Equipment Recommendations  None recommended by PT    Recommendations for Other Services OT consult     Precautions / Restrictions Precautions Precautions: Fall Precaution Comments: watch sats      Mobility  Bed Mobility Overal bed mobility: Needs Assistance Bed Mobility: Supine to Sit     Supine to sit: Min assist     General bed mobility comments: cues for sequence with use of rail, HOB 20 degrees and assist to elevate trunk and scoot to EOB  Transfers Overall transfer level: Needs assistance   Transfers: Sit to/from Stand Sit to Stand: Min assist         General transfer comment: cues for hand placement, sequence and anterior translation   Ambulation/Gait Ambulation/Gait assistance: Min assist Ambulation Distance (Feet): 22 Feet Assistive device: Rolling walker (2 wheeled) Gait Pattern/deviations: Step-through pattern;Decreased stride length;Trunk flexed;Narrow base of support   Gait velocity interpretation: Below normal speed for age/gender General Gait Details: cues for posture, position in RW as pt tends to stay too posterior,  safety and breathing technique. pt fatigues quickly   Stairs            Wheelchair Mobility    Modified Rankin (Stroke Patients Only)       Balance Overall balance assessment: Needs assistance   Sitting balance-Leahy Scale: Fair       Standing balance-Leahy Scale: Poor                               Pertinent Vitals/Pain Pain Assessment: 0-10 Pain Score: 4  Pain Location: LLE with touch Pain Descriptors / Indicators: Sore Pain Intervention(s): Limited activity within patient's tolerance;Monitored during session;Repositioned    Home Living Family/patient expects to be discharged to:: Private residence Living Arrangements: Children Available Help at Discharge: Family;Available PRN/intermittently Type of Home: House Home Access: Stairs to enter Entrance Stairs-Rails: None Entrance Stairs-Number of Steps: 1 Home Layout: Laundry or work area in basement;Two level Home Equipment: Walker - 4 wheels;Shower seat;Bedside commode;Walker - 2 wheels      Prior Function Level of Independence: Needs assistance   Gait / Transfers Assistance Needed: uses rollator at all times (except in basement uses RW)  ADL's / Homemaking Assistance Needed: performed ADLs without assist, son does the homemaking  Comments: son works and pt is alone during the day     Journalist, newspaper        Extremity/Trunk Assessment   Upper Extremity Assessment: Generalized weakness           Lower Extremity Assessment: Generalized weakness      Cervical / Trunk Assessment: Kyphotic  Communication   Communication: No difficulties  Cognition Arousal/Alertness: Awake/alert Behavior During Therapy: WFL for tasks assessed/performed Overall Cognitive Status: Within Functional Limits for tasks assessed                      General Comments      Exercises     Assessment/Plan    PT Assessment Patient needs continued PT services  PT Problem List Decreased  strength;Decreased activity tolerance;Decreased balance;Decreased knowledge of use of DME;Decreased mobility;Decreased skin integrity;Cardiopulmonary status limiting activity          PT Treatment Interventions Gait training;Stair training;Functional mobility training;Balance training;Therapeutic exercise;Patient/family education;DME instruction;Therapeutic activities    PT Goals (Current goals can be found in the Care Plan section)  Acute Rehab PT Goals Patient Stated Goal: be able to walk and return home PT Goal Formulation: With patient Time For Goal Achievement: 07/22/16 Potential to Achieve Goals: Fair    Frequency Min 3X/week   Barriers to discharge Decreased caregiver support      Co-evaluation               End of Session Equipment Utilized During Treatment: Gait belt;Oxygen Activity Tolerance: Patient tolerated treatment well Patient left: in chair;with call bell/phone within reach;with chair alarm set Nurse Communication: Mobility status         Time: 1020-1053 PT Time Calculation (min) (ACUTE ONLY): 33 min   Charges:   PT Evaluation $PT Eval Moderate Complexity: 1 Procedure PT Treatments $Therapeutic Activity: 8-22 mins   PT G Codes:        Mirinda Monte B Malique Driskill 2016-07-23, 11:38 AM  Elwyn Reach, [place]

## 2016-07-08 NOTE — Progress Notes (Signed)
Patient admitted to room 2W08 A&Ox4, VSS. Patient placed on tele and CCMD notified. Patient oriented to room and call light placed within reach. No questions or concerns at this time.

## 2016-07-09 LAB — BASIC METABOLIC PANEL
Anion gap: 9 (ref 5–15)
BUN: 24 mg/dL — AB (ref 6–20)
CHLORIDE: 97 mmol/L — AB (ref 101–111)
CO2: 32 mmol/L (ref 22–32)
Calcium: 10.1 mg/dL (ref 8.9–10.3)
Creatinine, Ser: 1.13 mg/dL — ABNORMAL HIGH (ref 0.44–1.00)
GFR calc Af Amer: 49 mL/min — ABNORMAL LOW (ref >60)
GFR calc non Af Amer: 42 mL/min — ABNORMAL LOW (ref >60)
GLUCOSE: 261 mg/dL — AB (ref 65–99)
POTASSIUM: 4 mmol/L (ref 3.5–5.1)
Sodium: 138 mmol/L (ref 135–145)

## 2016-07-09 LAB — GLUCOSE, CAPILLARY
GLUCOSE-CAPILLARY: 227 mg/dL — AB (ref 65–99)
Glucose-Capillary: 267 mg/dL — ABNORMAL HIGH (ref 65–99)
Glucose-Capillary: 217 mg/dL — ABNORMAL HIGH (ref 65–99)
Glucose-Capillary: 159 mg/dL — ABNORMAL HIGH (ref 65–99)

## 2016-07-09 LAB — CBC
HEMATOCRIT: 42.9 % (ref 36.0–46.0)
HEMOGLOBIN: 13.5 g/dL (ref 12.0–15.0)
MCH: 31.3 pg (ref 26.0–34.0)
MCHC: 31.5 g/dL (ref 30.0–36.0)
MCV: 99.5 fL (ref 78.0–100.0)
Platelets: 221 10*3/uL (ref 150–400)
RBC: 4.31 MIL/uL (ref 3.87–5.11)
RDW: 18.5 % — ABNORMAL HIGH (ref 11.5–15.5)
WBC: 6.6 10*3/uL (ref 4.0–10.5)

## 2016-07-09 MED ORDER — INSULIN ASPART 100 UNIT/ML ~~LOC~~ SOLN
5.0000 [IU] | Freq: Three times a day (TID) | SUBCUTANEOUS | Status: DC
  Administered 2016-07-09 (×2): 5 [IU] via SUBCUTANEOUS

## 2016-07-09 MED ORDER — INSULIN DETEMIR 100 UNIT/ML ~~LOC~~ SOLN
18.0000 [IU] | Freq: Every day | SUBCUTANEOUS | Status: DC
  Administered 2016-07-09: 18 [IU] via SUBCUTANEOUS
  Filled 2016-07-09 (×2): qty 0.18

## 2016-07-09 MED ORDER — INSULIN ASPART 100 UNIT/ML ~~LOC~~ SOLN
7.0000 [IU] | Freq: Three times a day (TID) | SUBCUTANEOUS | Status: DC
  Administered 2016-07-10 (×2): 7 [IU] via SUBCUTANEOUS

## 2016-07-09 MED ORDER — HYDRALAZINE HCL 20 MG/ML IJ SOLN: 10.0000 mg | INTRAMUSCULAR | Status: DC | PRN

## 2016-07-09 MED ORDER — INSULIN DETEMIR 100 UNIT/ML ~~LOC~~ SOLN
16.0000 [IU] | Freq: Every day | SUBCUTANEOUS | Status: DC
  Filled 2016-07-09: qty 0.16

## 2016-07-09 NOTE — Telephone Encounter (Signed)
No number on file for home health RN. Called patient, no answer when dialed.

## 2016-07-09 NOTE — Telephone Encounter (Signed)
I received notes from PT.

## 2016-07-09 NOTE — Progress Notes (Signed)
Physical Therapy Treatment Patient Details Name: Emma Yu MRN: [id number] DOB: 29-Aug-1926 Today's Date: 02-Aug-2016    History of Present Illness 82 yo admitted with SOB, heart failure and LLE cellulitis. PMHx: CHF, DM, AFib, Rt THA    PT Comments    Pt pleasant and demonstrated increased endurance and stability today. Pt maintained 96% on 4L with activity. Pt educated for transfers, gait and HEP with continued encouragement to mobilize daily with nursing.   Follow Up Recommendations  Supervision/Assistance - 24 hour;SNF     Equipment Recommendations       Recommendations for Other Services       Precautions / Restrictions Precautions Precautions: Fall Precaution Comments: watch sats    Mobility  Bed Mobility               General bed mobility comments: in chair on arrival  Transfers Overall transfer level: Needs assistance     Sit to Stand: Min assist         General transfer comment: cues for hand placement, sequence and anterior translation from chair and toilet  Ambulation/Gait Ambulation/Gait assistance: Min assist Ambulation Distance (Feet): 45 Feet Assistive device: Rolling walker (2 wheeled) Gait Pattern/deviations: Step-through pattern;Decreased stride length;Trunk flexed   Gait velocity interpretation: Below normal speed for age/gender General Gait Details: cues for posture, position in RW as pt tends to stay too posterior, safety . Pt walked 50' then 15' after seated rest   Stairs            Wheelchair Mobility    Modified Rankin (Stroke Patients Only)       Balance     Sitting balance-Leahy Scale: Good       Standing balance-Leahy Scale: Poor                      Cognition Arousal/Alertness: Awake/alert Behavior During Therapy: WFL for tasks assessed/performed Overall Cognitive Status: Within Functional Limits for tasks assessed                      Exercises General Exercises - Lower  Extremity Long Arc Quad: AROM;Both;Seated;15 reps Hip ABduction/ADduction: AROM;Both;Seated;15 reps Hip Flexion/Marching: AROM;Both;Seated;15 reps Toe Raises: AROM;Both;Seated;15 reps    General Comments        Pertinent Vitals/Pain Pain Assessment: 0-10 Pain Score: 4  Pain Location: LLE if touched Pain Descriptors / Indicators: Sore Pain Intervention(s): Limited activity within patient's tolerance;Monitored during session;Repositioned    Home Living                      Prior Function            PT Goals (current goals can now be found in the care plan section) Progress towards PT goals: Progressing toward goals    Frequency           PT Plan Current plan remains appropriate    Co-evaluation             End of Session Equipment Utilized During Treatment: Gait belt;Oxygen Activity Tolerance: Patient tolerated treatment well Patient left: in chair;with call bell/phone within reach;with chair alarm set     Time: [id number] PT Time Calculation (min) (ACUTE ONLY): 24 min  Charges:  $Gait Training: 8-22 mins $Therapeutic Exercise: 8-22 mins                    G Codes:      Allizon Woznick B Antwon Rochin 02-Aug-2016, 9:36 AM  Elwyn Reach, [place]

## 2016-07-09 NOTE — Progress Notes (Signed)
PROGRESS NOTE    Emma Yu  [id number]  DOB: 04/06/1927  DOA: 07/06/2016 PCP: [name], MD Outpatient Specialists:   Hospital course: HPI: Emma Yu is a 82 y.o. female with medical history significant of diastolic CHF, diabetes, atrial fibrillation. Patient was hospitalized from 10/11//17 to 06/05/2016.  Patient presented to the ER with shortness of breath. Patient states she's been having worsening swelling in her lower extremities as well as dry cough for about 1 week.  Patient's weight has increased from 137 on discharge to 154. Patient was sent from her PCPs office when she was found to have sats in the 70s. Patient was seen by PA Almyra Deforest on 06/27/2016, he adjusted her Lasix to 80 mg every morning and 40 mg in the afternoon every other day.    Assessment & Plan:   Acute respiratory failure with hypoxia/Acute exacerbation diastolic CHF: Acute.  -Patient with O2 sats -Pt improving, diuresed 6L, weight coming down, not at baseline of 141 yet but close - Plan to continue IV lasix today and start oral lasix tomorrow 11/21 -Admission Chest x-ray showing pulmonary edema.  BNP elevated at 230 -echo done 10/17-- will not repeat -cycle CE: negative for acute ischemia -  nasal cannula oxygen keep O2 sats greater than 91% - Place Foley catheter Strict ins and outs and daily - Hold Lasix po (recently increased by cardiology) - Lasix 40 mg IV every 12 hours - weaning oxygen [place] as tolerated, Pt will go home on oxygen, discussed SNF rehab with patient today, [facility]   Cellulitis of left LE -order-set used -IV ancef, change to oral doxycycline 11/21 -ABI done 11/10 - consult [place] nurse to see wounds on legs  Essential hypertension - Continue metoprolol and amlodipine  Diabetes mellitus type 2 on insulin pump: Insulin pump disconnected - Insulin pump removed on admission - Hypoglycemic protocols - CBGs every before meals and at bedtime  with amoderate sliding scale insulin - Levemir 16 units daily at bedtime plus sliding scale coverage plus prandial novolog 5 unit TIDWC - Adjust current regimen,as needed/restart insulin pump when able - Continue gabapentin   CBG (last 3)   Recent Labs  07/08/16 1633 07/08/16 2219 07/09/16 0618  GLUCAP 264* 162* 227*   Chronic kidney disease stage III:  - Following BMP with diuresis.  CAD - Continue aspirin  Hyperlipidemia -continue pravastatin  GERD - Continue pharmacy substitution of Protonix  DVT prophylaxis: heparin Code Status: full Family Communication: patient Disposition Plan: consult CSW for SNF placement [facility] for Tue or Wed Consults called: none Admission status: inpt for IV diuresis and IV Abx  Subjective: Pt starting to feel much better.  Still diuresing on lasix.    Objective: Vitals:   07/08/16 1400 07/08/16 1708 07/08/16 2022 07/09/16 0615  BP:  (!) 147/55 (!) 160/83 (!) 176/62  Pulse: 73 79 79 84  Resp:  18 18 20   Temp:  97.8 F (36.6 C) 98.1 F (36.7 C) 98.1 F (36.7 C)  TempSrc:  Oral Oral Oral  SpO2:  97% 97% 95%  Weight:    65.5 kg (144 lb 6.4 oz)  Height:        Intake/Output Summary (Last 24 hours) at 07/09/16 0733 Last data filed at 07/09/16 [id number]  Gross per 24 hour  Intake              480 ml  Output             3252  ml  Net            -2772 ml   Filed Weights   07/07/16 0444 07/08/16 0459 07/09/16 0615  Weight: 67.8 kg (149 lb 7.6 oz) 63.8 kg (140 lb 10.5 oz) 65.5 kg (144 lb 6.4 oz)   Exam:  General exam: awake, alert, no distress, cooperative.  BIPAP on Respiratory system: crackles bibasilar.  No increased work of breathing. Cardiovascular system: S1 & S2 heard,  No JVD, murmurs, gallops, clicks or pedal edema. Gastrointestinal system: Abdomen is nondistended, soft and nontender. Normal bowel sounds heard. Central nervous system: Alert and oriented. No focal neurological deficits. Extremities:  no cyanosis, trace pretibial edema bilateral.  Data Reviewed: Basic Metabolic Panel:  Recent Labs Lab 07/04/16 1609 07/06/16 1618 07/07/16 0236 07/08/16 0354 07/09/16 0244  NA 139 137 140 143 138  K 4.7 3.9 3.8 3.8 4.0  CL 98 99* 101 102 97*  CO2 31 28 30  32 32  GLUCOSE 161* 244* 194* 215* 261*  BUN 32* 43* 39* 31* 24*  CREATININE 1.13* 1.36* 1.31* 1.11* 1.13*  CALCIUM 10.3 9.9 9.4 10.0 10.1   Liver Function Tests: No results for input(s): AST, ALT, ALKPHOS, BILITOT, PROT, ALBUMIN in the last 168 hours. No results for input(s): LIPASE, AMYLASE in the last 168 hours. No results for input(s): AMMONIA in the last 168 hours. CBC:  Recent Labs Lab 07/04/16 1609 07/06/16 1506  WBC 7.5 6.8  HGB 12.1 11.7*  HCT 38.5 37.3  MCV 98.5 99.2  PLT 219 234   Cardiac Enzymes:  Recent Labs Lab 07/06/16 2149 07/07/16 0236 07/07/16 0849  TROPONINI <0.03 0.03* <0.03   CBG (last 3)   Recent Labs  07/08/16 1633 07/08/16 2219 07/09/16 0618  GLUCAP 264* 162* 227*   Recent Results (from the past 240 hour(s))  MRSA PCR Screening     Status: Abnormal   Collection Time: 07/06/16  9:43 PM  Result Value Ref Range Status   MRSA by PCR MRSA POS (A) NEGATIVE Final    Comment: RESULT CALLED TO, READ BACK BY AND VERIFIED WITH: B CUMMINGS,RN @0427  07/07/16 MKELLY,MLT        The GeneXpert MRSA Assay (FDA approved for NASAL specimens only), is one component of a comprehensive MRSA colonization surveillance program. It is not intended to diagnose MRSA infection nor to guide or monitor treatment for MRSA infections.     Studies: No results found. Scheduled Meds: . amLODipine  5 mg Oral Daily  . aspirin EC  81 mg Oral BID  .  ceFAZolin (ANCEF) IV  1 g Intravenous Q12H  . chlorhexidine  15 mL Mouth Rinse BID  . Chlorhexidine Gluconate Cloth  6 each Topical Q0600  . furosemide  40 mg Intravenous BID  . gabapentin  200 mg Oral QHS  . heparin  5,000 Units Subcutaneous Q8H  .  insulin aspart  0-5 Units Subcutaneous QHS  . insulin aspart  0-9 Units Subcutaneous TID WC  . insulin aspart  3 Units Subcutaneous TID WC  . insulin detemir  14 Units Subcutaneous QHS  . mouth rinse  15 mL Mouth Rinse q12n4p  . metoprolol  100 mg Oral BID  . mupirocin ointment  1 application Nasal BID  . pantoprazole  40 mg Oral Daily  . potassium chloride  40 mEq Oral Daily  . pravastatin  20 mg Oral QPM  . sodium chloride flush  3 mL Intravenous Q12H   Continuous Infusions:  Principal Problem:   Acute on  chronic diastolic heart failure (HCC) Active Problems:   Hyperlipidemia with target LDL less than 70   Essential hypertension, benign   Diabetes mellitus type 1 with peripheral artery disease (HCC)   Cellulitis   Diabetic peripheral neuropathy (HCC)   Chronic renal insufficiency, stage III (moderate)   Chronic atrial fibrillation (HCC)   Acute respiratory failure (HCC)   Acute respiratory failure with hypoxia (HCC)   Acute CHF (congestive heart failure) (HCC)   CHF (congestive heart failure) ([place])  Time spent:   Irwin Brakeman, MD, FAAFP [facility] Pager [phone]  If 7PM-7AM, please contact night-coverage [email or web address] Password [email or web address] 07/09/2016, 7:33 AM    LOS: 3 days

## 2016-07-09 NOTE — Clinical Social Work Placement (Signed)
   CLINICAL SOCIAL WORK PLACEMENT  NOTE  Date:  07/09/2016  Patient Details  Name: MALAYZIA WADLINGTON MRN: [id number] Date of Birth: 1926-10-08  Clinical Social Work is seeking post-discharge placement for this patient at the [place] level of care (*CSW will initial, date and re-position this form in  chart as items are completed):  Yes   Patient/family provided with [place] Work Department's list of facilities offering this level of care within the geographic area requested by the patient (or if unable, by the patient's family).  Yes   Patient/family informed of their freedom to choose among providers that offer the needed level of care, that participate in Medicare, Medicaid or managed care program needed by the patient, have an available bed and are willing to accept the patient.  Yes   Patient/family informed of [facility]'s ownership interest in [facility] and [facility], as well as of the fact that they are under no obligation to receive care at these facilities.  PASRR submitted to EDS on       PASRR number received on       Existing PASRR number confirmed on 07/09/16     FL2 transmitted to all facilities in geographic area requested by pt/family on 07/09/16     FL2 transmitted to all facilities within larger geographic area on 07/09/16     Patient informed that his/her managed care company has contracts with or will negotiate with certain facilities, including the following:            Patient/family informed of bed offers received.  Patient chooses bed at       Physician recommends and patient chooses bed at      Patient to be transferred to   on  .  Patient to be transferred to facility by       Patient family notified on   of transfer.  Name of family member notified:        PHYSICIAN Please sign FL2     Additional Comment:    _______________________________________________ Rhea Pink, MSW,   [place] [phone]

## 2016-07-09 NOTE — Clinical Social Work Note (Signed)
Clinical Social Work Assessment  Patient Details  Name: Emma Yu MRN: [id number] Date of Birth: 1927-02-01  Date of referral:  07/09/16               Reason for consult:  Discharge Planning                Permission sought to share information with:  Family Supports Permission granted to share information::  Yes, Release of Information Signed  Name::     Ayona Yniguez  Agency::     Relationship::  son  Contact Information:  [phone]  Housing/Transportation Living arrangements for the past 2 months:  Single Family Home (Patient lives at home with son) Source of Information:  Patient Patient Interpreter Needed:  None Criminal Activity/Legal Involvement Pertinent to Current Situation/Hospitalization:  No - Comment as needed Significant Relationships:  Adult Children Lives with:  Adult Children Do you feel safe going back to the place where you live?  Yes Need for family participation in patient care:  Yes (Comment)  Care giving concerns:  No family/friends at bedside. Patient states that her son lives at home with her   Social Worker assessment / plan:  Holiday representative met with patient(pt) at bedside to offer support and discuss patient needs at discharge. Pt states she lives at home with son Kemara Quigley. Pt is agreeable to skilled nursing facility placement and would prefer [facility] in [place], [place]. Pt stated she's had SNF in the past. CSW to complete necessary paperwork and initiate SNF placement. CSW to follow up with patient once bed offers are available. CSW remains available for support and to facilitate patient discharge needs once medically ready.c   Employment status:  Retired Forensic scientist:  Medicare PT Recommendations:  [place] / Referral to community resources:  [place]  Patient/Family's Response to care: Patient verbalizes appreciation and understanding for CSW role and involvement in  care. Patient agreeable with current discharge plan to SNF following discharge.   Patient/Family's Understanding of and Emotional Response to Diagnosis, Current Treatment, and Prognosis: Patient has good understanding of current medical state and limitations around most recent hospitalization. Patient is agreeable with SNF placement in hopes of transitioning to a more independent living.   Emotional Assessment Appearance:  Appears stated age Attitude/Demeanor/Rapport:  Other (attitude/demeanor is appropriate ) Affect (typically observed):  Accepting, Calm, Pleasant, Happy Orientation:    Alcohol / Substance use:  Not Applicable Psych involvement (Current and /or in the community):  No (Comment)  Discharge Needs  Concerns to be addressed:  Discharge Planning Concerns Readmission within the last 30 days:  No Current discharge risk:  None Barriers to Discharge:  No Barriers Identified   Rhea Pink, MSW,  [place]

## 2016-07-09 NOTE — NC FL2 (Signed)
[place] LEVEL OF CARE SCREENING TOOL     IDENTIFICATION  Patient Name: Emma Yu Birthdate: 1926/12/14 Sex: female Admission Date (Current Location): 07/06/2016  [facility] and [place] Number:  [employer] and Address:  [facility] [place] [place] [address] [place], [place] [address]      Provider Number: [id number]  Attending Physician Name and Address:  Murlean Iba, MD  Relative Name and Phone Number:       Current Level of Care: Hospital Recommended Level of Care: [place] Prior Approval Number:    Date Approved/Denied:   PASRR Number: [id number]  Discharge Plan: SNF    Current Diagnoses: Patient Active Problem List   Diagnosis Date Noted  . Acute on chronic diastolic heart failure ([place]) 07/06/2016  . Acute respiratory failure with hypoxia ([place]) 07/06/2016  . Cough 07/06/2016  . Acute CHF (congestive heart failure) ([place]) 07/06/2016  . CHF (congestive heart failure) ([place]) 07/06/2016  . Dyspnea   . Diabetes mellitus type 2 in nonobese ([place]) 05/31/2016  . Acute on chronic congestive heart failure ([place])   . CHF exacerbation ([place]) 05/30/2016  . Acute respiratory failure ([place]) 05/30/2016  . Decubitus ulcer of buttock, stage 1 10/06/2015  . Other seasonal allergic rhinitis 08/31/2015  . PAD (peripheral artery disease) ([place]) 07/05/2015  . Routine general medical examination at a health care facility 11/08/2014  . Chronic diastolic heart failure ([place]) 09/13/2014  . SOB (shortness of breath)   . Chronic atrial fibrillation ([place]) 06/29/2014  . Chronic renal insufficiency, stage III (moderate) 06/16/2014  . Diabetic peripheral neuropathy ([place]) 12/21/2013  . Back pain, chronic 06/18/2013  . Cellulitis 11/25/2012  . Mitral stenosis   . Aortic stenosis-moderate   . CAD-minimal 2012   . Diabetes mellitus type 1 with peripheral artery disease ([place]) 10/22/2011  . Chronic venous insufficiency 11/04/2009   . Hyperlipidemia with target LDL less than 70 08/04/2009  . Essential hypertension, benign 08/04/2009  . Atherosclerotic peripheral vascular disease with ulceration ([place]) 08/04/2009  . Osteoarthritis 08/04/2009    [place] RESPIRATION BLADDER Height & Weight     Self, Time, Situation, Place  O2 (4L) Continent Weight: 144 lb 6.4 oz (65.5 kg) Height:  5\' 4"  (162.6 cm)  BEHAVIORAL SYMPTOMS/MOOD NEUROLOGICAL BOWEL NUTRITION STATUS      Incontinent Diet (Heart Healthy / Carb Modified with Thin Liquids)  AMBULATORY STATUS COMMUNICATION OF NEEDS Skin   Limited Assist Verbally PU Stage and Appropriate Care (Venous stasis ulcer - left medial ankle)                       Personal Care Assistance Level of Assistance  Bathing, Feeding, Dressing Bathing Assistance: Limited assistance Feeding assistance: Independent Dressing Assistance: Limited assistance     Functional Limitations Info  Sight, Hearing, Speech Sight Info: Adequate Hearing Info: Adequate Speech Info: Adequate    SPECIAL CARE FACTORS FREQUENCY  PT (By licensed PT)     PT Frequency: 3/week OT Frequency: 3/week            Contractures Contractures Info: Not present    Additional Factors Info  Code Status, Allergies, Insulin Sliding Scale, Isolation Precautions Code Status Info: Full Code Allergies Info: Carvedilol, Augmentin Amoxicillin-pot Clavulanate, Clindamycin/lincomycin, Diltiazem Hcl, Fenofibrate, Hctz Hydrochlorothiazide, Nisoldipine, Nitroglycerin, Propranolol, Tekturna Aliskiren Fumarate, Valturna Aliskiren-valsartan, Septra Sulfamethoxazole-trimethoprim, Sulfa Antibiotics   Insulin Sliding Scale Info: Novolog 3 times daily with meal and bedtime Isolation Precautions Info: MRSA by pcr 07/06/2016  Current Medications (07/09/2016):  This is the current hospital active medication list Current Facility-Administered Medications  Medication Dose Route Frequency Provider Last Rate Last Dose  . 0.9  %  sodium chloride infusion  250 mL Intravenous PRN Geradine Girt, DO      . acetaminophen (TYLENOL) tablet 650 mg  650 mg Oral Q4H PRN Geradine Girt, DO   650 mg at 07/07/16 0209  . amLODipine (NORVASC) tablet 5 mg  5 mg Oral Daily Geradine Girt, DO   5 mg at 07/09/16 1110  . aspirin EC tablet 81 mg  81 mg Oral BID Geradine Girt, DO   81 mg at 07/09/16 1110  . benzonatate (TESSALON) capsule 100 mg  100 mg Oral TID PRN Geradine Girt, DO      . ceFAZolin (ANCEF) IVPB 1 g/50 mL premix  1 g Intravenous Q12H [place] Marisa Hua, MD   1 g at 07/09/16 1111  . chlorhexidine (PERIDEX) 0.12 % solution 15 mL  15 mL Mouth Rinse BID Geradine Girt, DO   15 mL at 07/09/16 1110  . Chlorhexidine Gluconate Cloth 2 % PADS 6 each  6 each Topical Q0600 Geradine Girt, DO   6 each at 07/09/16 [phone]  . furosemide (LASIX) injection 40 mg  40 mg Intravenous BID [place] Marisa Hua, MD   40 mg at 07/09/16 0825  . gabapentin (NEURONTIN) capsule 200 mg  200 mg Oral QHS Geradine Girt, DO   200 mg at 07/08/16 2143  . heparin injection 5,000 Units  5,000 Units Subcutaneous Q8H Geradine Girt, DO   5,000 Units at 07/09/16 0606  . hydrALAZINE (APRESOLINE) injection 10 mg  10 mg Intravenous Q4H PRN Clanford L Johnson, MD      . insulin aspart (novoLOG) injection 0-5 Units  0-5 Units Subcutaneous QHS Geradine Girt, DO   3 Units at 07/07/16 2158  . insulin aspart (novoLOG) injection 0-9 Units  0-9 Units Subcutaneous TID WC Geradine Girt, DO   3 Units at 07/09/16 0800  . insulin aspart (novoLOG) injection 5 Units  5 Units Subcutaneous TID WC Cl[place] Marisa Hua, MD   5 Units at 07/09/16 0825  . insulin detemir (LEVEMIR) injection 16 Units  16 Units Subcutaneous QHS Clanford L Johnson, MD      . MEDLINE mouth rinse  15 mL Mouth Rinse q12n4p Ambre Kobayashi U Vann, DO   15 mL at 07/08/16 1636  . metoprolol (LOPRESSOR) tablet 100 mg  100 mg Oral BID Geradine Girt, DO   100 mg at 07/09/16 1110  . mupirocin ointment (BACTROBAN) 2 % 1  application  1 application Nasal BID Geradine Girt, DO   1 application at [id number]  . ondansetron (ZOFRAN) injection 4 mg  4 mg Intravenous Q6H PRN Geradine Girt, DO      . pantoprazole (PROTONIX) EC tablet 40 mg  40 mg Oral Daily Geradine Girt, DO   40 mg at 07/09/16 1110  . potassium chloride SA (K-DUR,KLOR-CON) CR tablet 40 mEq  40 mEq Oral Daily Geradine Girt, DO   40 mEq at 07/09/16 1111  . pravastatin (PRAVACHOL) tablet 20 mg  20 mg Oral QPM Tinna Kolker U Vann, DO   20 mg at 07/08/16 1745  . sodium chloride flush (NS) 0.9 % injection 3 mL  3 mL Intravenous Q12H Junie Engram U Vann, DO   3 mL at 07/09/16 1114  . sodium chloride flush (NS) 0.9 % injection  3 mL  3 mL Intravenous PRN Geradine Girt, DO         Discharge Medications: Please see discharge summary for a list of discharge medications.  Relevant Imaging Results:  Relevant Lab Results:   Additional Information SSN [id number]    Barbette Or, [place]

## 2016-07-09 NOTE — Consult Note (Addendum)
[facility] Nurse wound consult note Reason for Consult: Consult requested for left leg wounds. ABI has been decreased, according to the EMR Wound type: Left anterior leg with 2 full thickness stasis ulcers Measurement: .2X.2X.1cm and .3X.3X.1cm Wound bed:dry yellow wound beds Drainage (amount, consistency, odor) Small amt yellow drainage, no odor  Periwound: Intact skin surrounding, generalized dry, darker-colored skin; appearance consistent with venous stasis changes. Dressing procedure/placement/frequency: Foam dressing to absorb drainage and promote healing. Please re-consult if further assistance is needed.  Thank-you,  Julien Girt MSN, [place], [place], [place], [place]

## 2016-07-10 DIAGNOSIS — L0390 Cellulitis, unspecified: Secondary | ICD-10-CM | POA: Diagnosis not present

## 2016-07-10 DIAGNOSIS — R262 Difficulty in walking, not elsewhere classified: Secondary | ICD-10-CM | POA: Diagnosis not present

## 2016-07-10 DIAGNOSIS — I5033 Acute on chronic diastolic (congestive) heart failure: Secondary | ICD-10-CM | POA: Diagnosis not present

## 2016-07-10 DIAGNOSIS — E1051 Type 1 diabetes mellitus with diabetic peripheral angiopathy without gangrene: Secondary | ICD-10-CM | POA: Diagnosis not present

## 2016-07-10 DIAGNOSIS — E0842 Diabetes mellitus due to underlying condition with diabetic polyneuropathy: Secondary | ICD-10-CM | POA: Diagnosis not present

## 2016-07-10 DIAGNOSIS — I482 Chronic atrial fibrillation: Secondary | ICD-10-CM | POA: Diagnosis not present

## 2016-07-10 DIAGNOSIS — J9601 Acute respiratory failure with hypoxia: Secondary | ICD-10-CM

## 2016-07-10 DIAGNOSIS — E785 Hyperlipidemia, unspecified: Secondary | ICD-10-CM | POA: Diagnosis not present

## 2016-07-10 DIAGNOSIS — I119 Hypertensive heart disease without heart failure: Secondary | ICD-10-CM | POA: Diagnosis not present

## 2016-07-10 DIAGNOSIS — I509 Heart failure, unspecified: Secondary | ICD-10-CM | POA: Diagnosis not present

## 2016-07-10 DIAGNOSIS — L03116 Cellulitis of left lower limb: Secondary | ICD-10-CM | POA: Diagnosis not present

## 2016-07-10 DIAGNOSIS — I5043 Acute on chronic combined systolic (congestive) and diastolic (congestive) heart failure: Secondary | ICD-10-CM | POA: Diagnosis not present

## 2016-07-10 DIAGNOSIS — I10 Essential (primary) hypertension: Secondary | ICD-10-CM | POA: Diagnosis not present

## 2016-07-10 LAB — BASIC METABOLIC PANEL
ANION GAP: 10 (ref 5–15)
BUN: 24 mg/dL — AB (ref 6–20)
CALCIUM: 10.5 mg/dL — AB (ref 8.9–10.3)
CO2: 31 mmol/L (ref 22–32)
CREATININE: 1.09 mg/dL — AB (ref 0.44–1.00)
Chloride: 96 mmol/L — ABNORMAL LOW (ref 101–111)
GFR calc Af Amer: 51 mL/min — ABNORMAL LOW (ref >60)
GFR, EST NON AFRICAN AMERICAN: 44 mL/min — AB (ref >60)
Glucose, Bld: 151 mg/dL — ABNORMAL HIGH (ref 65–99)
Potassium: 4.2 mmol/L (ref 3.5–5.1)
Sodium: 137 mmol/L (ref 135–145)

## 2016-07-10 LAB — CBC
HCT: 41.5 % (ref 36.0–46.0)
Hemoglobin: 13.2 g/dL (ref 12.0–15.0)
MCH: 31.3 pg (ref 26.0–34.0)
MCHC: 31.8 g/dL (ref 30.0–36.0)
MCV: 98.3 fL (ref 78.0–100.0)
PLATELETS: 251 10*3/uL (ref 150–400)
RBC: 4.22 MIL/uL (ref 3.87–5.11)
RDW: 18 % — AB (ref 11.5–15.5)
WBC: 7.6 10*3/uL (ref 4.0–10.5)

## 2016-07-10 LAB — GLUCOSE, CAPILLARY
GLUCOSE-CAPILLARY: 151 mg/dL — AB (ref 65–99)
GLUCOSE-CAPILLARY: 245 mg/dL — AB (ref 65–99)

## 2016-07-10 MED ORDER — TRAMADOL HCL 50 MG PO TABS: 50.0000 mg | ORAL_TABLET | Freq: Two times a day (BID) | ORAL | 0 refills | Status: DC | PRN

## 2016-07-10 MED ORDER — INSULIN ASPART 100 UNIT/ML ~~LOC~~ SOLN: 7.0000 [IU] | Freq: Three times a day (TID) | SUBCUTANEOUS | 11 refills | Status: DC

## 2016-07-10 MED ORDER — DOXYCYCLINE HYCLATE 100 MG PO TABS: 100.0000 mg | ORAL_TABLET | Freq: Two times a day (BID) | ORAL | 0 refills | Status: AC

## 2016-07-10 MED ORDER — FUROSEMIDE 80 MG PO TABS
80.0000 mg | ORAL_TABLET | Freq: Two times a day (BID) | ORAL | Status: DC
  Administered 2016-07-10: 80 mg via ORAL
  Filled 2016-07-10: qty 1

## 2016-07-10 MED ORDER — DOXYCYCLINE HYCLATE 100 MG PO TABS
100.0000 mg | ORAL_TABLET | Freq: Two times a day (BID) | ORAL | Status: DC
  Administered 2016-07-10: 100 mg via ORAL
  Filled 2016-07-10: qty 1

## 2016-07-10 MED ORDER — POTASSIUM CHLORIDE CRYS ER 20 MEQ PO TBCR: 40.0000 meq | EXTENDED_RELEASE_TABLET | Freq: Every day | ORAL | Status: DC

## 2016-07-10 MED ORDER — FUROSEMIDE 80 MG PO TABS: 80.0000 mg | ORAL_TABLET | Freq: Two times a day (BID) | ORAL | Status: DC

## 2016-07-10 MED ORDER — INSULIN DETEMIR 100 UNIT/ML ~~LOC~~ SOLN: 18.0000 [IU] | Freq: Every day | SUBCUTANEOUS | 11 refills | Status: DC

## 2016-07-10 NOTE — Discharge Instructions (Signed)
Heart Healthy Carb Modified Diet Only Check Blood sugars 5 times per day Wean off oxygen as tolerated: goal pulse ox 91% or greater Follow up with cardiologist Daily weights: take weights to cardiologist visit and primary care visit.  Check BMP in 1 week to check potassium and kidney function   Heart Failure Heart failure means your heart has trouble pumping blood. This makes it hard for your body to work well. Heart failure is usually a long-term (chronic) condition. You must take good care of yourself and follow your doctor's treatment plan. HOME CARE  Take your heart medicine as told by your doctor.  Do not stop taking medicine unless your doctor tells you to.  Do not skip any dose of medicine.  Refill your medicines before they run out.  Take other medicines only as told by your doctor or pharmacist.  Stay active if told by your doctor. The elderly and people with severe heart failure should talk with a doctor about physical activity.  Eat heart-healthy foods. Choose foods that are without trans fat and are low in saturated fat, cholesterol, and salt (sodium). This includes fresh or frozen fruits and vegetables, fish, lean meats, fat-free or low-fat dairy foods, whole grains, and high-fiber foods. Lentils and dried peas and beans (legumes) are also good choices.  Limit salt if told by your doctor.  Cook in a healthy way. Roast, grill, broil, bake, poach, steam, or stir-fry foods.  Limit fluids as told by your doctor.  Weigh yourself every morning. Do this after you pee (urinate) and before you eat breakfast. Write down your weight to give to your doctor.  Take your blood pressure and write it down if your doctor tells you to.  Ask your doctor how to check your pulse. Check your pulse as told.  Lose weight if told by your doctor.  Stop smoking or chewing tobacco. Do not use gum or patches that help you quit without your doctor's approval.  Schedule and go to doctor visits  as told.  Nonpregnant women should have no more than 1 drink a day. Men should have no more than 2 drinks a day. Talk to your doctor about drinking alcohol.  Stop illegal drug use.  Stay current with shots (immunizations).  Manage your health conditions as told by your doctor.  Learn to manage your stress.  Rest when you are tired.  If it is really hot outside:  Avoid intense activities.  Use air conditioning or fans, or get in a cooler place.  Avoid caffeine and alcohol.  Wear loose-fitting, lightweight, and light-colored clothing.  If it is really cold outside:  Avoid intense activities.  Layer your clothing.  Wear mittens or gloves, a hat, and a scarf when going outside.  Avoid alcohol.  Learn about heart failure and get support as needed.  Get help to maintain or improve your quality of life and your ability to care for yourself as needed. GET HELP IF:   You gain weight quickly.  You are more short of breath than usual.  You cannot do your normal activities.  You tire easily.  You cough more than normal, especially with activity.  You have any or more puffiness (swelling) in areas such as your hands, feet, ankles, or belly (abdomen).  You cannot sleep because it is hard to breathe.  You feel like your heart is beating fast (palpitations).  You get dizzy or light-headed when you stand up. GET HELP RIGHT AWAY IF:   You  have trouble breathing.  There is a change in mental status, such as becoming less alert or not being able to focus.  You have chest pain or discomfort.  You faint. MAKE SURE YOU:   Understand these instructions.  Will watch your condition.  Will get help right away if you are not doing well or get worse. This information is not intended to replace advice given to you by your health care provider. Make sure you discuss any questions you have with your health care provider. Document Released: 05/15/2008 Document Revised:  08/27/2014 Document Reviewed: 09/22/2012 Elsevier Interactive Patient Education  2017 [place].  Hypoxemia Hypoxemia occurs when your blood does not contain enough oxygen. The body cannot work well when it does not have enough oxygen because every part of your body needs oxygen. Oxygen travels to all parts of the body through your blood. Hypoxemia can develop suddenly or can come on slowly. CAUSES Some common causes of hypoxemia include:  Long-term (chronic) lung diseases, such as chronic obstructive pulmonary disease (COPD) or interstitial lung disease.  Disorders that affect breathing at night, such as sleep apnea.  Fluid buildup in your lungs (pulmonary edema).  Lung infection (pneumonia).  Lung or throat cancer.  Abnormal blood flow that bypasses the lungs (shunt).  Certain diseasesthat affect nerves or muscles.  A collapsed lung (pneumothorax).  A blood clot in the lungs (pulmonary embolus).  Certain types of heart disease.  Slow or shallow breathing (hypoventilation).  Certain medicines.  High altitudes.  Toxic chemicals and gases. SIGNS AND SYMPTOMS Not everyone who has hypoxemia will develop symptoms. If the hypoxemia developed quickly, you will likely have symptoms such as shortness of breath. If the hypoxemia came on slowly over months or years, you may not notice any symptoms. Symptoms can include:  Shortness of breath (dyspnea).  Bluish color of the skin, lips, or nail beds.  Breathing that is fast, noisy, or shallow.  A fast heartbeat.  Feeling tired or sleepy.  Being confused or feeling anxious. DIAGNOSIS To determine if you have hypoxemia, your health care provider may perform:  A physical exam.  Blood tests.  A pulse oximetry. A sensor will be put on your finger, toe, or earlobe to measure the percent of oxygen in your blood. TREATMENT You will likely be treated with oxygen therapy. Depending on the cause of your hypoxemia, you may  need oxygen for a short time (weeks or months), or you may need it indefinitely. Your health care provider may also recommend other therapies to treat the underlying cause of your hypoxemia. HOME CARE INSTRUCTIONS  Only take over-the-counter or prescription medicines as directed by your health care provider.  Follow oxygen safety measures if you are on oxygen therapy. These may include:  Always having a backup supply of oxygen.  Not allowing anyone to smoke around oxygen.  Handling the oxygen tanks carefully and as instructed.  If you smoke, quit. Stay away from people who smoke.  Follow up with your health care provider as directed. SEEK MEDICAL CARE IF:  You have any concerns about your oxygen therapy.  You still have trouble breathing.  You become short of breath when you exercise.  You are tired when you wake up.  You have a headache when you wake up. SEEK IMMEDIATE MEDICAL CARE IF:   Your breathing gets worse.  You have new shortness of breath with normal activity.  You have a bluish color of the skin, lips, or nail beds.  You have  confusion or cloudy thinking.  You cough up dark mucus.  You have chest pain.  You have a fever. MAKE SURE YOU:  Understand these instructions.  Will watch your condition.  Will get help right away if you are not doing well or get worse. This information is not intended to replace advice given to you by your health care provider. Make sure you discuss any questions you have with your health care provider. Document Released: 02/19/2011 Document Revised: 08/11/2013 Document Reviewed: 03/05/2013 Elsevier Interactive Patient Education  2017 [place].   Preventing Heart Failure Heart failure is a condition in which the heart has trouble pumping blood. This may mean that the heart cannot pump enough blood out to the body, or that the heart does not fill up with enough blood. Either of those problems can lead to symptoms such as  fatigue, trouble breathing, and swelling throughout the body. This is a common medical condition that affects not only the heart, but the entire body. Making certain nutrition and lifestyle changes can help you prevent heart failure and avoid serious health problems. What nutrition changes can be made?  If you are overweight or obese, reduce how many calories you eat each day so that you lose weight. Work with your health care provider or a diet and nutrition specialist (dietitian) to determine how many calories you need each day.  Eat foods that are low in salt (sodium). Avoid adding extra salt to foods.  Eat a well-balanced diet that includes a lot of:  Fresh fruits and vegetables.  Whole grains.  Lean meats.  Beans.  Fat-free or low-fat dairy products.  Avoid foods that contain a lot of:  Trans fats.  Saturated fats.  Sugar.  Cholesterol. What lifestyle changes can be made?  Do not use any products that contain nicotine or tobacco, such as cigarettes and e-cigarettes. If you need help quitting or reducing how much you smoke, ask your health care provider.  Stop using alcohol, or limit alcohol intake to no more than 1 drink a day for nonpregnant women and 2 drinks a day for men. One drink equals 12 oz of beer, 5 oz of wine, or 1 oz of hard liquor.  Exercise for at least 150 minutes each week, or as much as told by your health care provider.  Do moderate-intensity exercise, such as brisk walking, bicycling, or water aerobics.  Ask your health care provider which activities are safe for you.  See a health care provider regularly for screening and wellness checks. Know your heart health indicators, such as:  Blood pressure.  Cholesterol levels.  Blood sugar (glucose) levels.  Weight and BMI.  If you have diabetes, manage your condition and follow your treatment plan as instructed.  Try to get 7-9 hours of sleep each night. To help with sleep:  Keep your bedroom  cool and dark.  Do not eat a heavy meal during the hour before you go to bed.  Do not drink alcohol or caffeinated drinks before bed.  Avoid screen time before bedtime. This means avoiding television, computers, tablets, and cell phones.  Find ways to relax and manage stress. These may include:  Breathing exercises.  Meditation.  Yoga.  Listening to music. Why are these changes important?  A well-balanced diet with the appropriate amount of calories can keep your body weight at a healthy level, which reduces strain on your heart.  A low-sodium diet can help keep your blood pressure in a normal range and  keep your blood vessels working properly.  Quitting smoking and limiting alcohol intake can reduce harmful effects that these substances have on your heart and blood vessels.  Regular exercise can keep your heart strong so it can pump blood normally.  Managing diabetes helps your blood circulate and can help you maintain a healthy weight.  Managing stress helps to reduce the risk of high blood pressure and heart problems. What can happen if changes are not made? Heart failure can cause very serious problems that may get worse over time, such as:  Extreme fatigue during normal physical activities.  Shortness of breath or trouble breathing.  Swelling in your abdomen, legs, ankles, feet, or neck.  Fluid buildup throughout the body.  Weight gain.  Cough.  Frequent urination. What can I do to lower my risk? You may be able to lower your risk of heart failure by:  Losing weight or keeping your weight under control.  Working with your health care provider to manage your:  Cholesterol.  Blood pressure.  Diabetes, if this applies.  Eating a healthy diet.  Exercising regularly.  Avoiding unhealthy habits, such as smoking, drinking, or using drugs.  Getting plenty of sleep.  Managing your stress. How is this treated? Heart failure cannot be cured except by  heart transplant, but treatment can help to improve your quality of life. Treatment may include:  Medicines to help:  Lower blood pressure.  Remove excess sodium from your body.  Relax blood vessels.  Improve heart function.  Control other symptoms of heart failure.  Surgery to open blocked coronary arteries or repair damaged heart valves.  Implantation of a biventricular pacemaker to improve heart muscle function (cardiac resynchronization therapy). This device paces both the right ventricle and left ventricle.  Implantation of a device to treat serious abnormal heart rhythms (implantable cardioverter defibrillator, ICD).  Implantation of a mechanical heart pump to improve the pumping ability of your heart (left ventricular assist device, LVAD).  Heart transplant. This treatment is considered for certain people who do not improve with other treatments. Where to find more information:  National Heart, Lung, and Blood Institute: ClickDebate.gl  Centers for Disease Control and Prevention: LawyerNetworking.com.cy  [facility]: https://www.[place]-brown.info/  American Heart Association: ReligiousCamps.at.jsp Contact a health care provider if:  You have rapid weight gain.  You have increasing shortness of breath that is unusual for you.  You tire easily, or you are unable to participate in your usual activities.  You cough more than normal, especially with physical activity.  You have any swelling or more swelling in areas such as your hands, feet, ankles, or abdomen. Summary  Heart failure can be prevented by making changes to your diet and your lifestyle.  It is important to eat a healthy diet, manage your weight, exercise regularly, manage stress, avoid drugs and alcohol, and keep your  cholesterol and blood pressure under control.  Heart failure can cause very serious problems over time. This information is not intended to replace advice given to you by your health care provider. Make sure you discuss any questions you have with your health care provider. Document Released: 03/27/2016 Document Revised: 03/27/2016 Document Reviewed: 03/27/2016 Elsevier Interactive Patient Education  2017 [employer]. Patients with Insulin Pumps     For patients with Insulin Pumps: o Contact your diabetes doctor for specific instructions before surgery. o Decrease basal insulin rates by 20% at midnight the night before surgery. o Note that if your surgery is planned to be longer  than 2 hours, your insulin pump will be removed and intravenous (IV) insulin will be started and managed by the nurses and anesthesiologist. You will be able to restart your insulin pump once you are awake and able to manage it. o Make sure to bring insulin pump supplies to the hospital with you in case your site needs to be changed. Blood Glucose Monitoring, Adult Monitoring your blood sugar (glucose) helps you manage your diabetes. It also helps you and your health care provider determine how well your diabetes management plan is working. Blood glucose monitoring involves checking your blood glucose as often as directed, and keeping a record (log) of your results over time. Why should I monitor my blood glucose? Checking your blood glucose regularly can:  Help you understand how food, exercise, illnesses, and medicines affect your blood glucose.  Let you know what your blood glucose is at any time. You can quickly tell if you are having low blood glucose (hypoglycemia) or high blood glucose (hyperglycemia).  Help you and your health care provider adjust your medicines as needed. When should I check my blood glucose? Follow instructions from your health care provider about how often to check your blood glucose.  This may depend on:  The type of diabetes you have.  How well-controlled your diabetes is.  Medicines you are taking. If you have type 1 diabetes:  Check your blood glucose at least 2 times a day.  Also check your blood glucose:  Before every insulin injection.  Before and after exercise.  Between meals.  2 hours after a meal.  Occasionally between 2:00 a.m. and 3:00 a.m., as directed.  Before potentially dangerous tasks, like driving or using heavy machinery.  At bedtime.  You may need to check your blood glucose more often, up to 6-10 times a day:  If you use an insulin pump.  If you need multiple daily injections (MDI).  If your diabetes is not well-controlled.  If you are ill.  If you have a history of severe hypoglycemia.  If you have a history of not knowing when your blood glucose is getting low (hypoglycemia unawareness). If you have type 2 diabetes:  If you take insulin or other diabetes medicines, check your blood glucose at least 2 times a day.  If you are on intensive insulin therapy, check your blood glucose at least 4 times a day. Occasionally, you may also need to check between 2:00 a.m. and 3:00 a.m., as directed.  Also check your blood glucose:  Before and after exercise.  Before potentially dangerous tasks, like driving or using heavy machinery.  You may need to check your blood glucose more often if:  Your medicine is being adjusted.  Your diabetes is not well-controlled.  You are ill. What is a blood glucose log?  A blood glucose log is a record of your blood glucose readings. It helps you and your health care provider:  Look for patterns in your blood glucose over time.  Adjust your diabetes management plan as needed.  Every time you check your blood glucose, write down your result and notes about things that may be affecting your blood glucose, such as your diet and exercise for the day.  Most glucose meters store a record of  glucose readings in the meter. Some meters allow you to download your records to a computer. How do I check my blood glucose? Follow these steps to get accurate readings of your blood glucose: Supplies needed   Blood  glucose meter.  Test strips for your meter. Each meter has its own strips. You must use the strips that come with your meter.  A needle to prick your finger (lancet). Do not use lancets more than once.  A device that holds the lancet (lancing device).  A journal or log book to write down your results. Procedure  Wash your hands with soap and water.  Prick the side of your finger (not the tip) with the lancet. Use a different finger each time.  Gently rub the finger until a small drop of blood appears.  Follow instructions that come with your meter for inserting the test strip, applying blood to the strip, and using your blood glucose meter.  Write down your result and any notes. Alternative testing sites  Some meters allow you to use areas of your body other than your finger (alternative sites) to test your blood.  If you think you may have hypoglycemia, or if you have hypoglycemia unawareness, do not use alternative sites. Use your finger instead.  Alternative sites may not be as accurate as the fingers, because blood flow is slower in these areas. This means that the result you get may be delayed, and it may be different from the result that you would get from your finger.  The most common alternative sites are:  Forearm.  Thigh.  Palm of the hand. Additional tips  Always keep your supplies with you.  If you have questions or need help, all blood glucose meters have a 24-hour hotline number that you can call. You may also contact your health care provider.  After you use a few boxes of test strips, adjust (calibrate) your blood glucose meter by following instructions that came with your meter. This information is not intended to replace advice given to  you by your health care provider. Make sure you discuss any questions you have with your health care provider. Document Released: 08/09/2003 Document Revised: 02/24/2016 Document Reviewed: 01/16/2016 Elsevier Interactive Patient Education  2017 [place] Oxygen Use, Adult When a medical condition keeps you from getting enough oxygen, your health care provider may instruct you to take extra oxygen at home. Your health care provider will let you know:  When to take oxygen.  For how long to take oxygen.  How quickly oxygen should be delivered (flow rate), in liters per minute (LPM or L/M). Home oxygen can be given through:  A mask.  A nasal cannula. This is a device or tube that goes in the nostrils.  A transtracheal catheter. This is a small, flexible tube placed in the trachea.  A tracheostomy. This is a surgically made opening in the trachea. These devices are connected with tubing to an oxygen source, such as:  A tank. Tanks hold oxygen in gas form. They must be replaced when the oxygen is used up.  A liquid oxygen device. This holds oxygen in liquid form. It must be replaced when the oxygen is used up.  An oxygen concentrator machine. This filters oxygen in the room. It uses electricity, so you must have a backup cylinder of oxygen in case the power goes out. Supplies needed: To use oxygen, you will need:  A mask, nasal cannula, transtracheal catheter, or tracheostomy.  An oxygen tank, a liquid oxygen device, or an oxygen concentrator.  The tape that your health care provider recommends (optional). If you use a transtracheal catheter and your prescribed flow rate is 1 LPM or greater, you  will also need a humidifier. Risks and complications  Fire. This can happen if the oxygen is exposed to a heat source, flame, or spark.  Injury to skin. This can happen if liquid oxygen touches your skin.  Organ damage. This can happen if you get too little oxygen. How to  use oxygen Your health care provider will show you how to use your oxygen device. Follow her or his instructions. They may look something like this: 1. Wash your hands. 2. If you use an oxygen concentrator, make sure it is plugged in. 3. Place one end of the tube into the port on the tank, device, or machine. 4. Place the mask over your nose and mouth. Or, place the nasal cannula and secure it with tape if instructed. If you use a tracheostomy or transtracheal catheter, connect it to the oxygen source as directed. 5. Make sure the liter-flow setting on the machine is at the level prescribed by your health care provider. 6. Turn on the machine or adjust the knob on the tank or device to the correct liter-flow setting. 7. When you are done, turn off and unplug the machine, or turn the knob to OFF. How to clean and care for the oxygen supplies Nasal cannula  Clean it with a warm, wet cloth daily or as needed.  Wash it with a liquid soap once a week.  Rinse it thoroughly once or twice a week.  Replace it every 2-4 weeks.  If you have an infection, such as a cold or pneumonia, change the cannula when you get better. Mask  Replace it every 2-4 weeks.  If you have an infection, such as a cold or pneumonia, change the mask when you get better. Humidifier bottle  Wash the bottle between each refill:  Wash it with soap and warm water.  Rinse it thoroughly.  Disinfect it and its top.  Air-dry it.  Make sure it is dry before you refill it. Oxygen concentrator  Clean the air filter at least twice a week according to directions from your home medical equipment and service company.  Wipe down the cabinet every day. To do this:  Unplug the unit.  Wipe down the cabinet with a damp cloth.  Dry the cabinet. Other equipment  Change any extra tubing every 1-3 months.  Follow instructions from your health care provider about taking care of any other equipment. Safety tips Fire  safety tips   Keep your oxygen and oxygen supplies at least 5 ft away from sources of heat, flames, and sparks at all times.  Do not allow smoking near your oxygen. Put up "no smoking" signs in your home.  Do not use materials that can burn (are flammable) while you use oxygen.  When you go to a restaurant with portable oxygen, ask to be seated in the nonsmoking section.  Keep a Data processing manager close by. Let your fire department know that you have oxygen in your home.  Test your home smoke detectors regularly. General safety tips  If you use an oxygen cylinder, make sure it is in a stand or secured to an object that will not move (fixed object).  If you use liquid oxygen, make sure its container is kept upright.  If you use an oxygen concentrator:  Tell Loss adjuster, chartered company. Make sure you are given priority service in the event that your power goes out.  Avoid using extension cords, if possible. Follow these instructions at home:  Use oxygen only as  told by your health care provider.  Do not use alcohol or other drugs that make you relax (sedating drugs) unless instructed. They can slow down your breathing rate and make it hard to get in enough oxygen.  Know how and when to order a refill of oxygen.  Always keep a spare tank of oxygen. Plan ahead for holidays when you may not be able to get a prescription filled.  Use water-based lubricants on your lips or nostrils. Do not use oil-based products like petroleum jelly.  To prevent skin irritation on your cheeks or behind your ears, tuck some gauze under the tubing. Contact a health care provider if:  You get headaches often.  You have shortness of breath.  You have a lasting cough.  You have anxiety.  You are sleepy all the time.  You develop an illness that affects your breathing.  You cannot exercise at your regular level.  You are restless.  You have difficult or irregular breathing, and it is getting  worse.  You have a fever.  You have persistent redness under your nose. Get help right away if:  You are confused.  You have blue lips or fingernails.  You are struggling to breathe. This information is not intended to replace advice given to you by your health care provider. Make sure you discuss any questions you have with your health care provider. Document Released: 10/27/2003 Document Revised: 04/04/2016 Document Reviewed: 02/28/2016 Elsevier Interactive Patient Education  2017 [employer].  Hypoglycemia  Hypoglycemia is when the sugar (glucose) level in the blood is too low. Symptoms of low blood sugar may include:  Feeling:  Hungry.  Worried or nervous (anxious).  Sweaty and clammy.  Confused.  Dizzy.  Sleepy.  Sick to your stomach (nauseous).  Having:  A fast heartbeat.  A headache.  A change in your vision.  Jerky movements that you cannot control (seizure).  Nightmares.  Tingling or no feeling (numbness) around the mouth, lips, or tongue.  Having trouble with:  Talking.  Paying attention (concentrating).  Moving (coordination).  Sleeping.  Shaking.  Passing out (fainting).  Getting upset easily (irritability). Low blood sugar can happen to people who have diabetes and people who do not have diabetes. Low blood sugar can happen quickly, and it can be an emergency. Treating Low Blood Sugar  Low blood sugar is often treated by eating or drinking something sugary right away. If you can think clearly and swallow safely, follow the 15:15 rule:  Take 15 grams of a fast-acting carb (carbohydrate). Some fast-acting carbs are:  1 tube of glucose gel.  3 sugar tablets (glucose pills).  6-8 pieces of hard candy.  4 oz (120 mL) of fruit juice.  4 oz (120 mL) of regular (not diet) soda.  Check your blood sugar 15 minutes after you take the carb.  If your blood sugar is still at or below 70 mg/dL (3.9 mmol/L), take 15 grams of a carb  again.  If your blood sugar does not go above 70 mg/dL (3.9 mmol/L) after 3 tries, get help right away.  After your blood sugar goes back to normal, eat a meal or a snack within 1 hour. Treating Very Low Blood Sugar  If your blood sugar is at or below 54 mg/dL (3 mmol/L), you have very low blood sugar (severe hypoglycemia). This is an emergency. Do not wait to see if the symptoms will go away. Get medical help right away. Call your local emergency services (911  in the [place] Do not drive yourself to the hospital. If you have very low blood sugar and you cannot eat or drink, you may need a glucagon shot (injection). A family member or friend should learn how to check your blood sugar and how to give you a glucagon shot. Ask your doctor if you need to have a glucagon shot kit at home. Follow these instructions at home: General instructions  Avoid any diets that cause you to not eat enough food. Talk with your doctor before you start any new diet.  Take over-the-counter and prescription medicines only as told by your doctor.  Limit alcohol to no more than 1 drink per day for nonpregnant women and 2 drinks per day for men. One drink equals 12 oz of beer, 5 oz of wine, or 1 oz of hard liquor.  Keep all follow-up visits as told by your doctor. This is important. If You Have Diabetes:   Make sure you know the symptoms of low blood sugar.  Always keep a source of sugar with you, such as:  Sugar.  Sugar tablets.  Glucose gel.  Fruit juice.  Regular soda (not diet soda).  Milk.  Hard candy.  Honey.  Take your medicines as told.  Follow your exercise and meal plan.  Eat on time. Do not skip meals.  Follow your sick day plan when you cannot eat or drink normally. Make this plan ahead of time with your doctor.  Check your blood sugar as often as told by your doctor. Always check before and after exercise.  Share your diabetes care plan with:  Your work or school.  People you  live with.  Check your pee (urine) for ketones:  When you are sick.  As told by your doctor.  Carry a card or wear jewelry that says you have diabetes. If You Have Low Blood Sugar From Other Causes:   Check your blood sugar as often as told by your doctor.  Follow instructions from your doctor about what you cannot eat or drink. Contact a doctor if:  You have trouble keeping your blood sugar in your target range.  You have low blood sugar often. Get help right away if:  You still have symptoms after you eat or drink something sugary.  Your blood sugar is at or below 54 mg/dL (3 mmol/L).  You have jerky movements that you cannot control.  You pass out. These symptoms may be an emergency. Do not wait to see if the symptoms will go away. Get medical help right away. Call your local emergency services (911 in the [place] Do not drive yourself to the hospital.  This information is not intended to replace advice given to you by your health care provider. Make sure you discuss any questions you have with your health care provider. Document Released: 10/31/2009 Document Revised: 01/12/2016 Document Reviewed: 09/09/2015 Elsevier Interactive Patient Education  2017 [employer].

## 2016-07-10 NOTE — Care Management Note (Signed)
Case Management Note Marvetta Gibbons RN, BSN Unit 2W-Case Manager [phone]  Patient Details  Name: Emma Yu MRN: [id number] Date of Birth: 04/12/1927  Subjective/Objective:    Pt admitted with acute on chronic HF                Action/Plan: PTA pt lived at home with son- recommendations for STSNF per PT eval- CSW consulted for placement needs-   Expected Discharge Date:     07/10/16             Expected Discharge Plan:  [place]  In-House Referral:  Clinical Social Work  Discharge planning Services  CM Consult  Post Acute Care Choice:    Choice offered to:     DME Arranged:    DME Agency:     HH Arranged:    [place] Agency:     Status of Service:  Completed, signed off  If discussed at [employer], dates discussed:    Discharge Disposition: skilled facility   Additional Comments:  07/10/16- 9- [name] RN, CM- per CSW pt would like to go to [facility]- CSW following for placement- pt for d/c to SNF today.   Dawayne Patricia, RN 07/10/2016, 10:55 AM

## 2016-07-10 NOTE — Clinical Social Work Note (Signed)
Clinical Social Worker facilitated patient discharge including contacting patient family and facility to confirm patient discharge plans.  Clinical information faxed to facility and family agreeable with plan.  CSW arranged transport via family to [facility].  RN to call report prior to discharge.  Clinical Social Worker will sign off for now as social work intervention is no longer needed. Please consult [place] again if new need arises.  Barbette Or, [place]

## 2016-07-10 NOTE — Discharge Summary (Addendum)
Physician Discharge Summary  Emma Yu DOB: Jun 08, 1927 DOA: 07/06/2016  PCP: [name], MD  Admit date: 07/06/2016 Discharge date: 07/10/2016  Disposition:  SNF  Recommendations for Outpatient Follow-up:  Heart Healthy Carb Modified Diet Only Check Blood sugars 5 times per day, adjust insulin doses as needed Monitor oxygen saturation Wean off oxygen as tolerated: goal pulse ox 91% or greater Follow up with cardiologist in 1 week for recheck, follow up with PCP in 2 weeks for recheck Get Daily weights: take weights to cardiologist visit and primary care visit. Notify MD for any weight gain over 2 pounds in 2 days. Check BMP in 1 week to check potassium and kidney function  Equipment/Devices: oxygen Nasal Cannula 4L/min with goal to wean off over time Keep pulse ox 91% or greater  Discharge Condition: STABLE CODE STATUS: FULL Diet recommendation: Heart Healthy / Carb Modified   Brief/Interim Summary: HPI: Emma Yu is a 82 y.o. female with medical history significant of diastolic CHF, diabetes, atrial fibrillation. Patient was hospitalized from 10/11//17 to 06/05/2016.  Patient presented to the ER with shortness of breath. Patient states she's been having worsening swelling in her lower extremities as well as dry cough for about 1 week.  Patient's weight has increased from 137 on discharge to 154. Patient was sent from her PCPs office when she was found to have sats in the 70s. Patient was seen by PA Almyra Deforest on 06/27/2016, he adjusted her Lasix to 80 mg every morning and 40 mg in the afternoon every other day.   Patient also recently had ABIs done for lower extremity. She states that her left leg has had increasing redness over the last several days.  She had pain in the left leg and wounds prompting treatment for cellulitis.   ED Course: In the ER she was found on x-ray to have pulmonary edema. She was given 40 mg of IV Lasix 1. Unable to determine how  much urine output she had as patient is incontinent. The ER is unable to place Foley. BNP was found to be elevated.  Assessment & Plan:   Acute respiratory failure with hypoxia/Acute exacerbation diastolic CHF: Acute.  -Pt improving, diuresed 6L, weight coming down, not at baseline of 141 yet but close - Plan to continue IV lasix today and start oral lasix tomorrow 11/21 -Admission Chest x-ray showing pulmonary edema.  BNP elevated at 230 -echo done 10/17-- will not repeat -cycle CE: negative for acute ischemia -  nasal cannula oxygen keep O2 sats greater than 91% - Placed Foley catheter Strict ins and outs and daily: Pt has diuresed over 7 liters and weight down to 139 pounds at discharge.  - Resume p.o. Lasix 80 mg BID - weaning oxygen [place] as tolerated, Pt will be discharged on oxygen [place] at 3-4L/min to keep pulse ox 91% or higher   Cellulitis of left LE -order-set used -IV ancef, change to oral doxycycline 11/21 for 5 more days of therapy -ABI done 11/10 - consulted [place] nurse to see wounds on legs, recommended foam dressings to promote healing.  Essential hypertension - Continue metoprolol and amlodipine  Diabetes mellitus type 2 on insulin pump: Insulin pump disconnected - Insulin pump removed on admission - Hypoglycemic protocols - CBGs every before meals and at bedtime with amoderate sliding scale insulin Pt can resume insulin pump therapy at discharge with instructions to check BS 5x per day  CBG (last 3)   Recent Labs (last 2 labs)  Recent Labs  07/08/16 1633 07/08/16 2219 07/09/16 0618  GLUCAP 264* 162* 227*     Chronic kidney disease stage III:  - Following BMP with diuresis.  CAD - Continue aspirin bid as ordered   Hyperlipidemia -continue pravastatin  GERD - stable   DVT prophylaxis:heparin Code Status:full Disposition Plan:consult CSW for SNF placement  Discharge Diagnoses:  Principal Problem:   Acute on chronic diastolic  heart failure (HCC) Active Problems:   Hyperlipidemia with target LDL less than 70   Essential hypertension, benign   Diabetes mellitus type 1 with peripheral artery disease (HCC)   Cellulitis   Diabetic peripheral neuropathy (HCC)   Chronic renal insufficiency, stage III (moderate)   Chronic atrial fibrillation (HCC)   Acute respiratory failure (HCC)   Acute respiratory failure with hypoxia (HCC)   Acute CHF (congestive heart failure) (HCC)   CHF (congestive heart failure) ([place])  Discharge Instructions  Discharge Instructions    Diet - low sodium heart healthy    Complete by:  As directed    Discharge instructions    Complete by:  As directed    Low sodium heart healthy and carb modified diet only Check blood sugars 5 times per day Recheck BMP in 1 week.       Medication List    STOP taking these medications   benzonatate 100 MG capsule Commonly known as:  TESSALON   INSET INFUSION SET 23" 6MM Misc   insulin lispro 100 UNIT/ML injection Commonly known as:  HUMALOG   Insulin Pump Syringe Reservoir Misc   levofloxacin 500 MG tablet Commonly known as:  LEVAQUIN     TAKE these medications   acetaminophen 500 MG tablet Commonly known as:  TYLENOL Take 500-1,000 mg by mouth every 6 (six) hours as needed (for pain).   amLODipine 5 MG tablet Commonly known as:  NORVASC Take 1 tablet (5 mg total) by mouth daily.   aspirin EC 81 MG tablet Take 81 mg by mouth 2 (two) times daily.   b complex vitamins tablet Take 1 tablet by mouth daily.   doxycycline 100 MG tablet Commonly known as:  VIBRA-TABS Take 1 tablet (100 mg total) by mouth every 12 (twelve) hours.   fish oil-omega-3 fatty acids 1000 MG capsule Take 2 g by mouth 2 (two) times daily.   furosemide 80 MG tablet Commonly known as:  LASIX Take 1 tablet (80 mg total) by mouth 2 (two) times daily. What changed:  when to take this  Another medication with the same name was removed. Continue taking this  medication, and follow the directions you see here.   gabapentin 100 MG capsule Commonly known as:  NEURONTIN Take 2 capsules (200 mg total) by mouth at bedtime.   insulin aspart 100 UNIT/ML injection Commonly known as:  novoLOG Inject 7 Units into the skin 3 (three) times daily with meals.   insulin detemir 100 UNIT/ML injection Commonly known as:  LEVEMIR Inject 0.18 mLs (18 Units total) into the skin at bedtime.   metoprolol 100 MG tablet Commonly known as:  LOPRESSOR TAKE 1 TABLET (100 MG TOTAL) BY MOUTH 2 (TWO) TIMES DAILY.   omeprazole 40 MG capsule Commonly known as:  PRILOSEC Take 1 capsule by mouth  daily before breakfast   potassium chloride SA 20 MEQ tablet Commonly known as:  K-DUR,KLOR-CON Take 2 tablets (40 mEq total) by mouth daily.   pravastatin 20 MG tablet Commonly known as:  PRAVACHOL TAKE 1 TABLET BY MOUTH IN THE EVENING  What changed:  See the new instructions.   PROBIOTIC PO Take 1 tablet by mouth daily at 10 pm.   traMADol 50 MG tablet Commonly known as:  ULTRAM Take 1 tablet (50 mg total) by mouth every 12 (twelve) hours as needed (for pain).   vitamin C 500 MG tablet Commonly known as:  ASCORBIC ACID Take 500 mg by mouth daily.   Vitamin D3 2000 units capsule Take 2,000 Units by mouth daily.      Follow-up Information    Jenkins Rouge, MD. Schedule an appointment as soon as possible for a visit in 1 week(s).   Specialty:  Cardiology Why:  Hospital follow Up CHF Contact information: [id number] [address] [phone]        [name], MD. Schedule an appointment as soon as possible for a visit in 2 week(s).   Specialty:  Internal Medicine Why:  Hospital Follow Up  Contact information: [address] [address] [phone]        Renato Shin, MD. Schedule an appointment as soon as possible for a visit in 2 week(s).   Specialty:  Endocrinology Why:  Hospital Follow  Up  Contact information: [address] [place] [address] [phone]          Allergies  Allergen Reactions  . Carvedilol Other (See Comments)    Heart stops CARDIAC ARREST  . Augmentin [Amoxicillin-Pot Clavulanate] Diarrhea  . Clindamycin/Lincomycin Other (See Comments)    tremors  . Diltiazem Hcl Other (See Comments)    Chest pain;   07/02/14-  Patient has been able to tolerate diltiazem PO as inpatient since 06/29/14 without any adverse reaction.  . Fenofibrate Other (See Comments)    Unknown - NH - MAR  . Hctz [Hydrochlorothiazide] Other (See Comments)    Chest pain  . Nisoldipine Other (See Comments)    Chest pain  . Nitroglycerin Other (See Comments)    Unknown - NH MAR  . Propranolol Diarrhea    Chest pain  . Tekturna [Aliskiren Fumarate] Other (See Comments)    Unknown reaction  . Valturna [Aliskiren-Valsartan] Other (See Comments)    Unknown reaction  . Septra [Sulfamethoxazole-Trimethoprim] Rash    Rash, cyst  . Sulfa Antibiotics Rash   Procedures/Studies: Dg Chest 2 View  Result Date: 07/06/2016 CLINICAL DATA:  Shortness of breath and cough for 1 week. History of CHF and myocardial infarction. Acute respiratory failure. History of smoking. EXAM: CHEST  2 VIEW COMPARISON:  07/04/2016 FINDINGS: The heart is enlarged. Interval development of significant bilateral airspace filling opacities, more confluent at the bases and partially obscuring hemidiaphragms. Kerley B-lines are present. There are small bilateral pleural effusions. IMPRESSION: Interval development of airspace filling and interstitial prominence, consistent with congestive heart failure. Small effusions. Electronically Signed   By: Nolon Nations M.D.   On: 07/06/2016 15:38   Dg Chest 2 View  Result Date: 07/05/2016 CLINICAL DATA:  Patient with cough and increased shortness of breath. EXAM: CHEST  2 VIEW COMPARISON:  Chest radiograph 06/01/2016. FINDINGS: Stable enlarged  cardiac and mediastinal contours with tortuosity and calcification of the thoracic aorta. Pulmonary vascular redistribution. Mild pulmonary edema. Suggestion of small nodular opacity within the right lower hemi thorax. No large area of pulmonary consolidation. No pleural effusion or pneumothorax. Mid thoracic spine degenerative changes. IMPRESSION: Cardiomegaly.  Mild pulmonary edema. Suggestion of small nodular opacity within the right lower hemi thorax. Recommend short-term followup radiograph.  If this persists after resolution of acute symptomatology, recommend correlation with chest CT. Electronically Signed   By: Lovey Newcomer M.D.   On: 07/05/2016 08:17    Subjective: Pt feels much better, weight down to 139 pounds, diuresed over 7 liters since admission.  Going to SNF.    Discharge Exam: Vitals:   07/09/16 2046 07/10/16 0359  BP: (!) 139/55 138/83  Pulse: 86 78  Resp: 18 18  Temp: 97.5 F (36.4 C) 98.1 F (36.7 C)   Vitals:   07/09/16 2046 07/10/16 0359 07/10/16 1050 07/10/16 1104  BP: (!) 139/55 138/83    Pulse: 86 78    Resp: 18 18    Temp: 97.5 F (36.4 C) 98.1 F (36.7 C)    TempSrc: Oral Oral    SpO2: 100% 94% 100% 94%  Weight:  63.5 kg (139 lb 15.9 oz)    Height:        General exam: awake, alert, no distress, cooperative.  Respiratory system: crackles bibasilar.  No increased work of breathing. Cardiovascular system: S1 & S2 heard,  No JVD, murmurs, gallops, clicks or pedal edema. Gastrointestinal system: Abdomen is nondistended, soft and nontender. Normal bowel sounds heard. Central nervous system: Alert and oriented. No focal neurological deficits. Extremities: no cyanosis, no edema. Wounds clean and dry LLE.   The results of significant diagnostics from this hospitalization (including imaging, microbiology, ancillary and laboratory) are listed below for reference.    Microbiology: Recent Results (from the past 240 hour(s))  MRSA PCR Screening     Status:  Abnormal   Collection Time: 07/06/16  9:43 PM  Result Value Ref Range Status   MRSA by PCR MRSA POS (A) NEGATIVE Final    Comment: RESULT CALLED TO, READ BACK BY AND VERIFIED WITH: B CUMMINGS,RN @0427  07/07/16 MKELLY,MLT        The GeneXpert MRSA Assay (FDA approved for NASAL specimens only), is one component of a comprehensive MRSA colonization surveillance program. It is not intended to diagnose MRSA infection nor to guide or monitor treatment for MRSA infections.     Labs: BNP (last 3 results)  Recent Labs  05/30/16 1831 06/01/16 0215 07/06/16 1506  BNP 296.8* 188.0* [id number]*   Basic Metabolic Panel:  Recent Labs Lab 07/06/16 1618 07/07/16 0236 07/08/16 0354 07/09/16 0244 07/10/16 0346  NA 137 140 143 138 137  K 3.9 3.8 3.8 4.0 4.2  CL 99* 101 102 97* 96*  CO2 28 30 32 32 31  GLUCOSE 244* 194* 215* 261* 151*  BUN 43* 39* 31* 24* 24*  CREATININE 1.36* 1.31* 1.11* 1.13* 1.09*  CALCIUM 9.9 9.4 10.0 10.1 10.5*   Liver Function Tests: No results for input(s): AST, ALT, ALKPHOS, BILITOT, PROT, ALBUMIN in the last 168 hours. No results for input(s): LIPASE, AMYLASE in the last 168 hours. No results for input(s): AMMONIA in the last 168 hours. CBC:  Recent Labs Lab 07/04/16 1609 07/06/16 1506 07/09/16 0833 07/10/16 0346  WBC 7.5 6.8 6.6 7.6  HGB 12.1 11.7* 13.5 13.2  HCT 38.5 37.3 42.9 41.5  MCV 98.5 99.2 99.5 98.3  PLT 219 234 221 251   Cardiac Enzymes:  Recent Labs Lab 07/06/16 2149 07/07/16 0236 07/07/16 0849  TROPONINI <0.03 0.03* <0.03   BNP: Invalid input(s): POCBNP CBG:  Recent Labs Lab 07/09/16 0618 07/09/16 1127 07/09/16 1709 07/09/16 2105 07/10/16 0621  GLUCAP 227* 267* 217* 159* 151*   D-Dimer No results for input(s): DDIMER in the last 72 hours. Hgb A1c  No results for input(s): HGBA1C in the last 72 hours. Lipid Profile No results for input(s): CHOL, HDL, LDLCALC, TRIG, CHOLHDL, LDLDIRECT in the last 72 hours. Thyroid  function studies No results for input(s): TSH, T4TOTAL, T3FREE, THYROIDAB in the last 72 hours.  Invalid input(s): FREET3 Anemia work up No results for input(s): VITAMINB12, FOLATE, FERRITIN, TIBC, IRON, RETICCTPCT in the last 72 hours. Urinalysis    Component Value Date/Time   COLORURINE YELLOW 06/03/2016 1030   APPEARANCEUR CLOUDY (A) 06/03/2016 1030   LABSPEC 1.013 06/03/2016 1030   PHURINE 5.5 06/03/2016 1030   GLUCOSEU NEGATIVE 06/03/2016 1030   GLUCOSEU NEGATIVE 08/31/2015 1613   HGBUR SMALL (A) 06/03/2016 1030   HGBUR small 08/07/2010 1418   BILIRUBINUR NEGATIVE 06/03/2016 1030   BILIRUBINUR negative 05/05/2013 1707   KETONESUR NEGATIVE 06/03/2016 1030   PROTEINUR NEGATIVE 06/03/2016 1030   UROBILINOGEN 0.2 08/31/2015 1613   NITRITE POSITIVE (A) 06/03/2016 1030   LEUKOCYTESUR MODERATE (A) 06/03/2016 1030   Sepsis Labs Invalid input(s): PROCALCITONIN,  WBC,  LACTICIDVEN Microbiology Recent Results (from the past 240 hour(s))  MRSA PCR Screening     Status: Abnormal   Collection Time: 07/06/16  9:43 PM  Result Value Ref Range Status   MRSA by PCR MRSA POS (A) NEGATIVE Final    Comment: RESULT CALLED TO, READ BACK BY AND VERIFIED WITH: B CUMMINGS,RN @0427  07/07/16 MKELLY,MLT        The GeneXpert MRSA Assay (FDA approved for NASAL specimens only), is one component of a comprehensive MRSA colonization surveillance program. It is not intended to diagnose MRSA infection nor to guide or monitor treatment for MRSA infections.    Time coordinating discharge: 37 minutes  SIGNED:  Irwin Brakeman, MD  [facility] 07/10/2016, 11:32 AM Pager   If 7PM-7AM, please contact night-coverage [email or web address] Password [email or web address]

## 2016-07-10 NOTE — Clinical Social Work Placement (Signed)
   CLINICAL SOCIAL WORK PLACEMENT  NOTE  Date:  07/10/2016  Patient Details  Name: NELSON WESTERGAARD MRN: [id number] Date of Birth: June 22, 1927  Clinical Social Work is seeking post-discharge placement for this patient at the [place] level of care (*CSW will initial, date and re-position this form in  chart as items are completed):  Yes   Patient/family provided with [facility] Work Department's list of facilities offering this level of care within the geographic area requested by the patient (or if unable, by the patient's family).  Yes   Patient/family informed of their freedom to choose among providers that offer the needed level of care, that participate in Medicare, Medicaid or managed care program needed by the patient, have an available bed and are willing to accept the patient.  Yes   Patient/family informed of [facility]'s ownership interest in [facility] and [facility], as well as of the fact that they are under no obligation to receive care at these facilities.  PASRR submitted to EDS on       PASRR number received on       Existing PASRR number confirmed on 07/09/16     FL2 transmitted to all facilities in geographic area requested by pt/family on 07/09/16     FL2 transmitted to all facilities within larger geographic area on 07/09/16     Patient informed that his/her managed care company has contracts with or will negotiate with certain facilities, including the following:        Yes   Patient/family informed of bed offers received.  Patient chooses bed at [place], [facility]     Physician recommends and patient chooses bed at      Patient to be transferred to [place] on 07/10/16.  Patient to be transferred to facility by Patient son     Patient family notified on 07/10/16 of transfer.  Name of family member notified:  Patient son updated via phone     PHYSICIAN Please sign FL2     Additional Comment:     Barbette Or, [place]

## 2016-07-11 ENCOUNTER — Telehealth: Payer: Self-pay

## 2016-07-11 ENCOUNTER — Ambulatory Visit: Payer: Medicare Other | Admitting: Physician Assistant

## 2016-07-11 NOTE — Telephone Encounter (Signed)
Pt on TCM list for 07/11/2016 - pt has be D/C to [facility]

## 2016-07-16 ENCOUNTER — Encounter (HOSPITAL_COMMUNITY): Payer: Self-pay | Admitting: Family Medicine

## 2016-07-16 DIAGNOSIS — I119 Hypertensive heart disease without heart failure: Secondary | ICD-10-CM | POA: Diagnosis not present

## 2016-07-16 DIAGNOSIS — I509 Heart failure, unspecified: Secondary | ICD-10-CM | POA: Diagnosis not present

## 2016-07-16 DIAGNOSIS — L03116 Cellulitis of left lower limb: Secondary | ICD-10-CM | POA: Diagnosis not present

## 2016-07-16 DIAGNOSIS — R262 Difficulty in walking, not elsewhere classified: Secondary | ICD-10-CM | POA: Diagnosis not present

## 2016-07-19 ENCOUNTER — Telehealth: Payer: Self-pay

## 2016-07-19 ENCOUNTER — Telehealth: Payer: Self-pay | Admitting: Endocrinology

## 2016-07-19 NOTE — Telephone Encounter (Signed)
Apolonio Schneiders, one of the nurses with [facility] called and said that this Pt is being discharged today and has not been on her insulin pump the whole time she has been in the facility.  They want to know if she needs to go back on the insulin pump when she returns home.  Please advise. Rachel CB # [phone]

## 2016-07-19 NOTE — Telephone Encounter (Signed)
Please continue the same insulin for now F/u ov next week, when we'll discuss options

## 2016-07-19 NOTE — Telephone Encounter (Signed)
See message and please advise, Thanks!  

## 2016-07-19 NOTE — Telephone Encounter (Signed)
I contacted [facility] and spoke with and Dorothea Ogle, he advised me the patient was addiment before she was discharged from the facility she will not use the injections. I contacted the patient and she advised me she is going to go back on her previous pump settings before she was admitted to [facility]. I requested to patient to come in for an appointment tomorrow at 10 am and she agreed to this time and date. Appointment schedule to further discuss insulin pump/medications.

## 2016-07-20 ENCOUNTER — Ambulatory Visit: Payer: Medicare Other | Admitting: Endocrinology

## 2016-07-20 DIAGNOSIS — Z7982 Long term (current) use of aspirin: Secondary | ICD-10-CM | POA: Diagnosis not present

## 2016-07-20 DIAGNOSIS — N183 Chronic kidney disease, stage 3 (moderate): Secondary | ICD-10-CM | POA: Diagnosis not present

## 2016-07-20 DIAGNOSIS — Z794 Long term (current) use of insulin: Secondary | ICD-10-CM | POA: Diagnosis not present

## 2016-07-20 DIAGNOSIS — I4891 Unspecified atrial fibrillation: Secondary | ICD-10-CM | POA: Diagnosis not present

## 2016-07-20 DIAGNOSIS — Z85828 Personal history of other malignant neoplasm of skin: Secondary | ICD-10-CM | POA: Diagnosis not present

## 2016-07-20 DIAGNOSIS — I5033 Acute on chronic diastolic (congestive) heart failure: Secondary | ICD-10-CM | POA: Diagnosis not present

## 2016-07-20 DIAGNOSIS — E1122 Type 2 diabetes mellitus with diabetic chronic kidney disease: Secondary | ICD-10-CM | POA: Diagnosis not present

## 2016-07-20 DIAGNOSIS — I130 Hypertensive heart and chronic kidney disease with heart failure and stage 1 through stage 4 chronic kidney disease, or unspecified chronic kidney disease: Secondary | ICD-10-CM | POA: Diagnosis not present

## 2016-07-20 DIAGNOSIS — E1151 Type 2 diabetes mellitus with diabetic peripheral angiopathy without gangrene: Secondary | ICD-10-CM | POA: Diagnosis not present

## 2016-07-20 DIAGNOSIS — Z9641 Presence of insulin pump (external) (internal): Secondary | ICD-10-CM | POA: Diagnosis not present

## 2016-07-20 DIAGNOSIS — Z87891 Personal history of nicotine dependence: Secondary | ICD-10-CM | POA: Diagnosis not present

## 2016-07-20 DIAGNOSIS — E1121 Type 2 diabetes mellitus with diabetic nephropathy: Secondary | ICD-10-CM | POA: Diagnosis not present

## 2016-07-20 DIAGNOSIS — I2510 Atherosclerotic heart disease of native coronary artery without angina pectoris: Secondary | ICD-10-CM | POA: Diagnosis not present

## 2016-07-23 DIAGNOSIS — E1151 Type 2 diabetes mellitus with diabetic peripheral angiopathy without gangrene: Secondary | ICD-10-CM | POA: Diagnosis not present

## 2016-07-23 DIAGNOSIS — Z794 Long term (current) use of insulin: Secondary | ICD-10-CM | POA: Diagnosis not present

## 2016-07-23 DIAGNOSIS — Z85828 Personal history of other malignant neoplasm of skin: Secondary | ICD-10-CM | POA: Diagnosis not present

## 2016-07-23 DIAGNOSIS — I2510 Atherosclerotic heart disease of native coronary artery without angina pectoris: Secondary | ICD-10-CM | POA: Diagnosis not present

## 2016-07-23 DIAGNOSIS — E1121 Type 2 diabetes mellitus with diabetic nephropathy: Secondary | ICD-10-CM | POA: Diagnosis not present

## 2016-07-23 DIAGNOSIS — Z87891 Personal history of nicotine dependence: Secondary | ICD-10-CM | POA: Diagnosis not present

## 2016-07-23 DIAGNOSIS — N183 Chronic kidney disease, stage 3 (moderate): Secondary | ICD-10-CM | POA: Diagnosis not present

## 2016-07-23 DIAGNOSIS — I4891 Unspecified atrial fibrillation: Secondary | ICD-10-CM | POA: Diagnosis not present

## 2016-07-23 DIAGNOSIS — I5033 Acute on chronic diastolic (congestive) heart failure: Secondary | ICD-10-CM | POA: Diagnosis not present

## 2016-07-23 DIAGNOSIS — E1122 Type 2 diabetes mellitus with diabetic chronic kidney disease: Secondary | ICD-10-CM | POA: Diagnosis not present

## 2016-07-23 DIAGNOSIS — Z7982 Long term (current) use of aspirin: Secondary | ICD-10-CM | POA: Diagnosis not present

## 2016-07-23 DIAGNOSIS — I130 Hypertensive heart and chronic kidney disease with heart failure and stage 1 through stage 4 chronic kidney disease, or unspecified chronic kidney disease: Secondary | ICD-10-CM | POA: Diagnosis not present

## 2016-07-23 DIAGNOSIS — Z9641 Presence of insulin pump (external) (internal): Secondary | ICD-10-CM | POA: Diagnosis not present

## 2016-07-24 ENCOUNTER — Telehealth: Payer: Self-pay | Admitting: Internal Medicine

## 2016-07-24 DIAGNOSIS — E1122 Type 2 diabetes mellitus with diabetic chronic kidney disease: Secondary | ICD-10-CM | POA: Diagnosis not present

## 2016-07-24 DIAGNOSIS — I130 Hypertensive heart and chronic kidney disease with heart failure and stage 1 through stage 4 chronic kidney disease, or unspecified chronic kidney disease: Secondary | ICD-10-CM | POA: Diagnosis not present

## 2016-07-24 DIAGNOSIS — Z794 Long term (current) use of insulin: Secondary | ICD-10-CM | POA: Diagnosis not present

## 2016-07-24 DIAGNOSIS — Z7982 Long term (current) use of aspirin: Secondary | ICD-10-CM | POA: Diagnosis not present

## 2016-07-24 DIAGNOSIS — I5033 Acute on chronic diastolic (congestive) heart failure: Secondary | ICD-10-CM | POA: Diagnosis not present

## 2016-07-24 DIAGNOSIS — Z9641 Presence of insulin pump (external) (internal): Secondary | ICD-10-CM | POA: Diagnosis not present

## 2016-07-24 DIAGNOSIS — E1151 Type 2 diabetes mellitus with diabetic peripheral angiopathy without gangrene: Secondary | ICD-10-CM | POA: Diagnosis not present

## 2016-07-24 DIAGNOSIS — E1121 Type 2 diabetes mellitus with diabetic nephropathy: Secondary | ICD-10-CM | POA: Diagnosis not present

## 2016-07-24 DIAGNOSIS — N183 Chronic kidney disease, stage 3 (moderate): Secondary | ICD-10-CM | POA: Diagnosis not present

## 2016-07-24 DIAGNOSIS — Z87891 Personal history of nicotine dependence: Secondary | ICD-10-CM | POA: Diagnosis not present

## 2016-07-24 DIAGNOSIS — I4891 Unspecified atrial fibrillation: Secondary | ICD-10-CM | POA: Diagnosis not present

## 2016-07-24 DIAGNOSIS — I2510 Atherosclerotic heart disease of native coronary artery without angina pectoris: Secondary | ICD-10-CM | POA: Diagnosis not present

## 2016-07-24 DIAGNOSIS — Z85828 Personal history of other malignant neoplasm of skin: Secondary | ICD-10-CM | POA: Diagnosis not present

## 2016-07-24 NOTE — Telephone Encounter (Signed)
[place] and gave verbal okay as requested.

## 2016-07-24 NOTE — Telephone Encounter (Signed)
Requesting verbal orders for OT for twice a week for three weeks.

## 2016-07-25 DIAGNOSIS — I2510 Atherosclerotic heart disease of native coronary artery without angina pectoris: Secondary | ICD-10-CM | POA: Diagnosis not present

## 2016-07-25 DIAGNOSIS — Z87891 Personal history of nicotine dependence: Secondary | ICD-10-CM | POA: Diagnosis not present

## 2016-07-25 DIAGNOSIS — E1121 Type 2 diabetes mellitus with diabetic nephropathy: Secondary | ICD-10-CM | POA: Diagnosis not present

## 2016-07-25 DIAGNOSIS — E1122 Type 2 diabetes mellitus with diabetic chronic kidney disease: Secondary | ICD-10-CM | POA: Diagnosis not present

## 2016-07-25 DIAGNOSIS — N183 Chronic kidney disease, stage 3 (moderate): Secondary | ICD-10-CM | POA: Diagnosis not present

## 2016-07-25 DIAGNOSIS — I5033 Acute on chronic diastolic (congestive) heart failure: Secondary | ICD-10-CM | POA: Diagnosis not present

## 2016-07-25 DIAGNOSIS — Z794 Long term (current) use of insulin: Secondary | ICD-10-CM | POA: Diagnosis not present

## 2016-07-25 DIAGNOSIS — Z85828 Personal history of other malignant neoplasm of skin: Secondary | ICD-10-CM | POA: Diagnosis not present

## 2016-07-25 DIAGNOSIS — I4891 Unspecified atrial fibrillation: Secondary | ICD-10-CM | POA: Diagnosis not present

## 2016-07-25 DIAGNOSIS — Z7982 Long term (current) use of aspirin: Secondary | ICD-10-CM | POA: Diagnosis not present

## 2016-07-25 DIAGNOSIS — E1151 Type 2 diabetes mellitus with diabetic peripheral angiopathy without gangrene: Secondary | ICD-10-CM | POA: Diagnosis not present

## 2016-07-25 DIAGNOSIS — I130 Hypertensive heart and chronic kidney disease with heart failure and stage 1 through stage 4 chronic kidney disease, or unspecified chronic kidney disease: Secondary | ICD-10-CM | POA: Diagnosis not present

## 2016-07-25 DIAGNOSIS — Z9641 Presence of insulin pump (external) (internal): Secondary | ICD-10-CM | POA: Diagnosis not present

## 2016-07-26 DIAGNOSIS — I5033 Acute on chronic diastolic (congestive) heart failure: Secondary | ICD-10-CM | POA: Diagnosis not present

## 2016-07-26 DIAGNOSIS — E1121 Type 2 diabetes mellitus with diabetic nephropathy: Secondary | ICD-10-CM | POA: Diagnosis not present

## 2016-07-26 DIAGNOSIS — Z85828 Personal history of other malignant neoplasm of skin: Secondary | ICD-10-CM | POA: Diagnosis not present

## 2016-07-26 DIAGNOSIS — Z9641 Presence of insulin pump (external) (internal): Secondary | ICD-10-CM | POA: Diagnosis not present

## 2016-07-26 DIAGNOSIS — I2510 Atherosclerotic heart disease of native coronary artery without angina pectoris: Secondary | ICD-10-CM | POA: Diagnosis not present

## 2016-07-26 DIAGNOSIS — Z794 Long term (current) use of insulin: Secondary | ICD-10-CM | POA: Diagnosis not present

## 2016-07-26 DIAGNOSIS — I130 Hypertensive heart and chronic kidney disease with heart failure and stage 1 through stage 4 chronic kidney disease, or unspecified chronic kidney disease: Secondary | ICD-10-CM | POA: Diagnosis not present

## 2016-07-26 DIAGNOSIS — Z7982 Long term (current) use of aspirin: Secondary | ICD-10-CM | POA: Diagnosis not present

## 2016-07-26 DIAGNOSIS — E1151 Type 2 diabetes mellitus with diabetic peripheral angiopathy without gangrene: Secondary | ICD-10-CM | POA: Diagnosis not present

## 2016-07-26 DIAGNOSIS — N183 Chronic kidney disease, stage 3 (moderate): Secondary | ICD-10-CM | POA: Diagnosis not present

## 2016-07-26 DIAGNOSIS — E1122 Type 2 diabetes mellitus with diabetic chronic kidney disease: Secondary | ICD-10-CM | POA: Diagnosis not present

## 2016-07-26 DIAGNOSIS — Z87891 Personal history of nicotine dependence: Secondary | ICD-10-CM | POA: Diagnosis not present

## 2016-07-26 DIAGNOSIS — I4891 Unspecified atrial fibrillation: Secondary | ICD-10-CM | POA: Diagnosis not present

## 2016-07-27 DIAGNOSIS — I5033 Acute on chronic diastolic (congestive) heart failure: Secondary | ICD-10-CM | POA: Diagnosis not present

## 2016-07-27 DIAGNOSIS — I130 Hypertensive heart and chronic kidney disease with heart failure and stage 1 through stage 4 chronic kidney disease, or unspecified chronic kidney disease: Secondary | ICD-10-CM | POA: Diagnosis not present

## 2016-07-27 DIAGNOSIS — Z87891 Personal history of nicotine dependence: Secondary | ICD-10-CM | POA: Diagnosis not present

## 2016-07-27 DIAGNOSIS — Z7982 Long term (current) use of aspirin: Secondary | ICD-10-CM | POA: Diagnosis not present

## 2016-07-27 DIAGNOSIS — E1122 Type 2 diabetes mellitus with diabetic chronic kidney disease: Secondary | ICD-10-CM | POA: Diagnosis not present

## 2016-07-27 DIAGNOSIS — N183 Chronic kidney disease, stage 3 (moderate): Secondary | ICD-10-CM | POA: Diagnosis not present

## 2016-07-27 DIAGNOSIS — I4891 Unspecified atrial fibrillation: Secondary | ICD-10-CM | POA: Diagnosis not present

## 2016-07-27 DIAGNOSIS — Z9641 Presence of insulin pump (external) (internal): Secondary | ICD-10-CM | POA: Diagnosis not present

## 2016-07-27 DIAGNOSIS — I2510 Atherosclerotic heart disease of native coronary artery without angina pectoris: Secondary | ICD-10-CM | POA: Diagnosis not present

## 2016-07-27 DIAGNOSIS — Z85828 Personal history of other malignant neoplasm of skin: Secondary | ICD-10-CM | POA: Diagnosis not present

## 2016-07-27 DIAGNOSIS — Z794 Long term (current) use of insulin: Secondary | ICD-10-CM | POA: Diagnosis not present

## 2016-07-27 DIAGNOSIS — E1151 Type 2 diabetes mellitus with diabetic peripheral angiopathy without gangrene: Secondary | ICD-10-CM | POA: Diagnosis not present

## 2016-07-27 DIAGNOSIS — E1121 Type 2 diabetes mellitus with diabetic nephropathy: Secondary | ICD-10-CM | POA: Diagnosis not present

## 2016-07-30 DIAGNOSIS — I2510 Atherosclerotic heart disease of native coronary artery without angina pectoris: Secondary | ICD-10-CM | POA: Diagnosis not present

## 2016-07-30 DIAGNOSIS — E1121 Type 2 diabetes mellitus with diabetic nephropathy: Secondary | ICD-10-CM | POA: Diagnosis not present

## 2016-07-30 DIAGNOSIS — I5033 Acute on chronic diastolic (congestive) heart failure: Secondary | ICD-10-CM | POA: Diagnosis not present

## 2016-07-30 DIAGNOSIS — I4891 Unspecified atrial fibrillation: Secondary | ICD-10-CM | POA: Diagnosis not present

## 2016-07-30 DIAGNOSIS — Z7982 Long term (current) use of aspirin: Secondary | ICD-10-CM | POA: Diagnosis not present

## 2016-07-30 DIAGNOSIS — E1151 Type 2 diabetes mellitus with diabetic peripheral angiopathy without gangrene: Secondary | ICD-10-CM | POA: Diagnosis not present

## 2016-07-30 DIAGNOSIS — Z85828 Personal history of other malignant neoplasm of skin: Secondary | ICD-10-CM | POA: Diagnosis not present

## 2016-07-30 DIAGNOSIS — N183 Chronic kidney disease, stage 3 (moderate): Secondary | ICD-10-CM | POA: Diagnosis not present

## 2016-07-30 DIAGNOSIS — I130 Hypertensive heart and chronic kidney disease with heart failure and stage 1 through stage 4 chronic kidney disease, or unspecified chronic kidney disease: Secondary | ICD-10-CM | POA: Diagnosis not present

## 2016-07-30 DIAGNOSIS — E1122 Type 2 diabetes mellitus with diabetic chronic kidney disease: Secondary | ICD-10-CM | POA: Diagnosis not present

## 2016-07-30 DIAGNOSIS — Z9641 Presence of insulin pump (external) (internal): Secondary | ICD-10-CM | POA: Diagnosis not present

## 2016-07-30 DIAGNOSIS — Z87891 Personal history of nicotine dependence: Secondary | ICD-10-CM | POA: Diagnosis not present

## 2016-07-30 DIAGNOSIS — Z794 Long term (current) use of insulin: Secondary | ICD-10-CM | POA: Diagnosis not present

## 2016-07-31 ENCOUNTER — Telehealth: Payer: Self-pay | Admitting: Endocrinology

## 2016-07-31 ENCOUNTER — Telehealth: Payer: Self-pay | Admitting: Cardiovascular Disease

## 2016-07-31 DIAGNOSIS — E1151 Type 2 diabetes mellitus with diabetic peripheral angiopathy without gangrene: Secondary | ICD-10-CM | POA: Diagnosis not present

## 2016-07-31 DIAGNOSIS — I2510 Atherosclerotic heart disease of native coronary artery without angina pectoris: Secondary | ICD-10-CM | POA: Diagnosis not present

## 2016-07-31 DIAGNOSIS — Z87891 Personal history of nicotine dependence: Secondary | ICD-10-CM | POA: Diagnosis not present

## 2016-07-31 DIAGNOSIS — Z794 Long term (current) use of insulin: Secondary | ICD-10-CM | POA: Diagnosis not present

## 2016-07-31 DIAGNOSIS — N183 Chronic kidney disease, stage 3 (moderate): Secondary | ICD-10-CM | POA: Diagnosis not present

## 2016-07-31 DIAGNOSIS — I5033 Acute on chronic diastolic (congestive) heart failure: Secondary | ICD-10-CM | POA: Diagnosis not present

## 2016-07-31 DIAGNOSIS — Z9641 Presence of insulin pump (external) (internal): Secondary | ICD-10-CM | POA: Diagnosis not present

## 2016-07-31 DIAGNOSIS — E1122 Type 2 diabetes mellitus with diabetic chronic kidney disease: Secondary | ICD-10-CM | POA: Diagnosis not present

## 2016-07-31 DIAGNOSIS — E1121 Type 2 diabetes mellitus with diabetic nephropathy: Secondary | ICD-10-CM | POA: Diagnosis not present

## 2016-07-31 DIAGNOSIS — I130 Hypertensive heart and chronic kidney disease with heart failure and stage 1 through stage 4 chronic kidney disease, or unspecified chronic kidney disease: Secondary | ICD-10-CM | POA: Diagnosis not present

## 2016-07-31 DIAGNOSIS — Z7982 Long term (current) use of aspirin: Secondary | ICD-10-CM | POA: Diagnosis not present

## 2016-07-31 DIAGNOSIS — Z85828 Personal history of other malignant neoplasm of skin: Secondary | ICD-10-CM | POA: Diagnosis not present

## 2016-07-31 DIAGNOSIS — I4891 Unspecified atrial fibrillation: Secondary | ICD-10-CM | POA: Diagnosis not present

## 2016-07-31 NOTE — Telephone Encounter (Signed)
Spoke with Baker Janus, HHN.  She states pt has had a 5+ lb weight gain in the last week.  Pt currently taking Lasix 80mg  BID.  No changes in pt's SOB, 1+ pitting edema noted to right leg, no changes in left leg.  Baker Janus states she could see if someone could come out with the CHF vest tomorrow if Dr. Johnsie Cancel felt it was warranted.  Pt's O2 on RA is 93%.  Advised pt I would send message to Dr. Johnsie Cancel for review and advisement.  Fax number in the event of new orders is [phone].

## 2016-07-31 NOTE — Telephone Encounter (Signed)
Pt is in need of test strips for the one touch she tests 4 times daily call into [facility] please

## 2016-07-31 NOTE — Telephone Encounter (Signed)
Order requested from [facility]. Waiting on fax from [facility]

## 2016-07-31 NOTE — Telephone Encounter (Signed)
New message  Emma Yu; pts Home Health Nurse call requesting to speak with RN about pt weight gain. She states pt was released from nursing home about a week ago, returning home weighting 138. Baker Janus states pt is currently weighting in at 143.6. Please call back to discuss

## 2016-08-01 ENCOUNTER — Ambulatory Visit: Payer: Medicare Other | Admitting: Internal Medicine

## 2016-08-01 ENCOUNTER — Other Ambulatory Visit: Payer: Self-pay

## 2016-08-01 DIAGNOSIS — I5033 Acute on chronic diastolic (congestive) heart failure: Secondary | ICD-10-CM | POA: Diagnosis not present

## 2016-08-01 DIAGNOSIS — E1121 Type 2 diabetes mellitus with diabetic nephropathy: Secondary | ICD-10-CM | POA: Diagnosis not present

## 2016-08-01 DIAGNOSIS — Z7982 Long term (current) use of aspirin: Secondary | ICD-10-CM | POA: Diagnosis not present

## 2016-08-01 DIAGNOSIS — Z87891 Personal history of nicotine dependence: Secondary | ICD-10-CM | POA: Diagnosis not present

## 2016-08-01 DIAGNOSIS — Z9641 Presence of insulin pump (external) (internal): Secondary | ICD-10-CM | POA: Diagnosis not present

## 2016-08-01 DIAGNOSIS — N183 Chronic kidney disease, stage 3 (moderate): Secondary | ICD-10-CM | POA: Diagnosis not present

## 2016-08-01 DIAGNOSIS — Z85828 Personal history of other malignant neoplasm of skin: Secondary | ICD-10-CM | POA: Diagnosis not present

## 2016-08-01 DIAGNOSIS — E1122 Type 2 diabetes mellitus with diabetic chronic kidney disease: Secondary | ICD-10-CM | POA: Diagnosis not present

## 2016-08-01 DIAGNOSIS — I2510 Atherosclerotic heart disease of native coronary artery without angina pectoris: Secondary | ICD-10-CM | POA: Diagnosis not present

## 2016-08-01 DIAGNOSIS — E1151 Type 2 diabetes mellitus with diabetic peripheral angiopathy without gangrene: Secondary | ICD-10-CM | POA: Diagnosis not present

## 2016-08-01 DIAGNOSIS — I130 Hypertensive heart and chronic kidney disease with heart failure and stage 1 through stage 4 chronic kidney disease, or unspecified chronic kidney disease: Secondary | ICD-10-CM | POA: Diagnosis not present

## 2016-08-01 DIAGNOSIS — I4891 Unspecified atrial fibrillation: Secondary | ICD-10-CM | POA: Diagnosis not present

## 2016-08-01 DIAGNOSIS — Z794 Long term (current) use of insulin: Secondary | ICD-10-CM | POA: Diagnosis not present

## 2016-08-01 MED ORDER — METOLAZONE 2.5 MG PO TABS: 2.5000 mg | ORAL_TABLET | ORAL | 3 refills | Status: DC

## 2016-08-01 NOTE — Telephone Encounter (Signed)
Called Gail back with HH. Informed her of Dr. Kyla Balzarine recommendations- Should be on Lasix 80 BID, start zaroxyln 2.5 mb and have her take qod 30 minutes before am lasix dose. Patient already has appointment with Dr. Johnsie Cancel tomorrow. Will check BMET and BNP at appointment.

## 2016-08-01 NOTE — Progress Notes (Signed)
Patient Name: Emma Yu Date of Encounter: 08/02/2016     Active Problems:   * No active hospital problems. *    SUBJECTIVE Emma Yu a 82 y.o. Marland Kitchenfemalewith medical history significant of diastolic CHF, diabetes, atrial fibrillation. Patient was hospitalized from 10/11//17 to 07/10/2016. Patient presented to the ER with shortness of breath. Patient states she's been having worsening swelling in her lower extremities as well as dry cough for about 1 week. Patient's weight has increased from to 154. Rx for CHF with d/c weight 139 lbs  BNP only 230 Also Rx for cellulitis Of LLE anceph and then oral doxycycline .   No dyspnea but abdomen feels swollen and legs hurt   PHYSICAL EXAM  General: Pleasant, NAD. Neuro: Alert and oriented X 3. Moves all extremities spontaneously. Psych: Normal affect. HEENT:  Normal  Neck: Supple without bruits or JVD. Lungs:  Resp regular and unlabored, CTA. Heart: regularly irregular. no s3, s4, or murmurs. SEM  Abdomen: Soft, non-tender, non-distended, BS + x 4.  Extremities: marked erythema to knees no warmth plus 2 chronic edema/stasis   Accessory Clinical Findings  CBC No results for input(s): WBC, NEUTROABS, HGB, HCT, MCV, PLT in the last 72 hours. Basic Metabolic Panel No results for input(s): NA, K, CL, CO2, GLUCOSE, BUN, CREATININE, CALCIUM, MG, PHOS in the last 72 hours.    ECG  07/08/16 afib rate 81 LVH poor R wave progression     Echocardiogram 05/31/16   Study Conclusions  - Left ventricle: The cavity size was normal. Wall thickness was   increased in a pattern of mild LVH. Systolic function was   vigorous. The estimated ejection fraction was in the range of 65%   to 70%. Wall motion was normal; there were no regional wall   motion abnormalities. - Aortic valve: There was mild regurgitation. - Mitral valve: Severely calcified annulus. The findings are   consistent with mild stenosis. - Left atrium:  The atrium was moderately dilated. - Right atrium: The atrium was mildly dilated. - Pericardium, extracardiac: There was a left pleural effusion.  Impressions:  - Vigorous LV systolic function; mild LVH; biatrial enlargement;   calcified aortic valve with mild AI; narrow LVOT with resting   gradient of 2 m/s; severe MAC with mild MS (mean gradient 6   mmHg); trace MR; mild TR.    Radiology/Studies  Dg Chest 2 View  Result Date: 07/06/2016 CLINICAL DATA:  Shortness of breath and cough for 1 week. History of CHF and myocardial infarction. Acute respiratory failure. History of smoking. EXAM: CHEST  2 VIEW COMPARISON:  07/04/2016 FINDINGS: The heart is enlarged. Interval development of significant bilateral airspace filling opacities, more confluent at the bases and partially obscuring hemidiaphragms. Kerley B-lines are present. There are small bilateral pleural effusions. IMPRESSION: Interval development of airspace filling and interstitial prominence, consistent with congestive heart failure. Small effusions. Electronically Signed   By: Nolon Nations M.D.   On: 07/06/2016 15:38   Dg Chest 2 View  Result Date: 07/05/2016 CLINICAL DATA:  Patient with cough and increased shortness of breath. EXAM: CHEST  2 VIEW COMPARISON:  Chest radiograph 06/01/2016. FINDINGS: Stable enlarged cardiac and mediastinal contours with tortuosity and calcification of the thoracic aorta. Pulmonary vascular redistribution. Mild pulmonary edema. Suggestion of small nodular opacity within the right lower hemi thorax. No large area of pulmonary consolidation. No pleural effusion or pneumothorax. Mid thoracic spine degenerative changes. IMPRESSION: Cardiomegaly.  Mild pulmonary edema. Suggestion of small nodular  opacity within the right lower hemi thorax. Recommend short-term followup radiograph. If this persists after resolution of acute symptomatology, recommend correlation with chest CT. Electronically Signed   By:  Lovey Newcomer M.D.   On: 07/05/2016 08:17    ASSESSMENT AND PLAN  1. Acute on chronic diastolic CHF (congestive heart failure) starting zaroxyln monitoring Shows low chest impedence a lot of her issues seem due to chronic venous disease LE;s at risk for recurrent cellulitis BMET 3 weeks f/u PA 4 weeks and me next available   2. Chronic atrial fibrillation  - not a candidate for systemic anticoagulation given falls and frailty   3. Diabetes mellitus type 1 with peripheral artery disease 4.Aortic stenosis-moderate 5. Essential hypertension 6. Chronic venous insufficiency 7. CAD-minimal 2012   Jenkins [place]

## 2016-08-01 NOTE — Telephone Encounter (Signed)
Should be on lasix 80 bid call in zaroxyln 2.5 mg and have her take qod 30 minutes before am lasix dose Have her in to see PA or CHF clinic and check BMET/BNP

## 2016-08-02 ENCOUNTER — Ambulatory Visit (INDEPENDENT_AMBULATORY_CARE_PROVIDER_SITE_OTHER): Payer: Medicare Other | Admitting: Cardiovascular Disease

## 2016-08-02 ENCOUNTER — Encounter: Payer: Self-pay | Admitting: Cardiovascular Disease

## 2016-08-02 VITALS — BP 130/60 | HR 95 | Ht 64.0 in | Wt 144.0 lb

## 2016-08-02 DIAGNOSIS — Z79899 Other long term (current) drug therapy: Secondary | ICD-10-CM | POA: Diagnosis not present

## 2016-08-02 DIAGNOSIS — Z794 Long term (current) use of insulin: Secondary | ICD-10-CM | POA: Diagnosis not present

## 2016-08-02 DIAGNOSIS — Z85828 Personal history of other malignant neoplasm of skin: Secondary | ICD-10-CM | POA: Diagnosis not present

## 2016-08-02 DIAGNOSIS — I4891 Unspecified atrial fibrillation: Secondary | ICD-10-CM | POA: Diagnosis not present

## 2016-08-02 DIAGNOSIS — Z9641 Presence of insulin pump (external) (internal): Secondary | ICD-10-CM | POA: Diagnosis not present

## 2016-08-02 DIAGNOSIS — Z7982 Long term (current) use of aspirin: Secondary | ICD-10-CM | POA: Diagnosis not present

## 2016-08-02 DIAGNOSIS — I5021 Acute systolic (congestive) heart failure: Secondary | ICD-10-CM

## 2016-08-02 DIAGNOSIS — E1121 Type 2 diabetes mellitus with diabetic nephropathy: Secondary | ICD-10-CM | POA: Diagnosis not present

## 2016-08-02 DIAGNOSIS — I5033 Acute on chronic diastolic (congestive) heart failure: Secondary | ICD-10-CM | POA: Diagnosis not present

## 2016-08-02 DIAGNOSIS — I2510 Atherosclerotic heart disease of native coronary artery without angina pectoris: Secondary | ICD-10-CM | POA: Diagnosis not present

## 2016-08-02 DIAGNOSIS — I509 Heart failure, unspecified: Secondary | ICD-10-CM | POA: Diagnosis not present

## 2016-08-02 DIAGNOSIS — I130 Hypertensive heart and chronic kidney disease with heart failure and stage 1 through stage 4 chronic kidney disease, or unspecified chronic kidney disease: Secondary | ICD-10-CM | POA: Diagnosis not present

## 2016-08-02 DIAGNOSIS — N183 Chronic kidney disease, stage 3 (moderate): Secondary | ICD-10-CM | POA: Diagnosis not present

## 2016-08-02 DIAGNOSIS — E1151 Type 2 diabetes mellitus with diabetic peripheral angiopathy without gangrene: Secondary | ICD-10-CM | POA: Diagnosis not present

## 2016-08-02 DIAGNOSIS — Z87891 Personal history of nicotine dependence: Secondary | ICD-10-CM | POA: Diagnosis not present

## 2016-08-02 DIAGNOSIS — E1122 Type 2 diabetes mellitus with diabetic chronic kidney disease: Secondary | ICD-10-CM | POA: Diagnosis not present

## 2016-08-02 NOTE — Patient Instructions (Addendum)
Medication Instructions:  Your physician recommends that you continue on your current medications as directed. Please refer to the Current Medication list given to you today.  Labwork: Your physician recommends that you have lab work 3 weeks BMET and BNP  Testing/Procedures: NONE  Follow-Up: Your physician wants you to follow-up next available with Dr. Johnsie Cancel.   Your physician recommends that you schedule a follow-up appointment in: 4 weeks with a PA/NP.   If you need a refill on your cardiac medications before your next appointment, please call your pharmacy.

## 2016-08-03 DIAGNOSIS — Z87891 Personal history of nicotine dependence: Secondary | ICD-10-CM | POA: Diagnosis not present

## 2016-08-03 DIAGNOSIS — I2510 Atherosclerotic heart disease of native coronary artery without angina pectoris: Secondary | ICD-10-CM | POA: Diagnosis not present

## 2016-08-03 DIAGNOSIS — E1122 Type 2 diabetes mellitus with diabetic chronic kidney disease: Secondary | ICD-10-CM | POA: Diagnosis not present

## 2016-08-03 DIAGNOSIS — N183 Chronic kidney disease, stage 3 (moderate): Secondary | ICD-10-CM | POA: Diagnosis not present

## 2016-08-03 DIAGNOSIS — Z794 Long term (current) use of insulin: Secondary | ICD-10-CM | POA: Diagnosis not present

## 2016-08-03 DIAGNOSIS — Z9641 Presence of insulin pump (external) (internal): Secondary | ICD-10-CM | POA: Diagnosis not present

## 2016-08-03 DIAGNOSIS — I4891 Unspecified atrial fibrillation: Secondary | ICD-10-CM | POA: Diagnosis not present

## 2016-08-03 DIAGNOSIS — I130 Hypertensive heart and chronic kidney disease with heart failure and stage 1 through stage 4 chronic kidney disease, or unspecified chronic kidney disease: Secondary | ICD-10-CM | POA: Diagnosis not present

## 2016-08-03 DIAGNOSIS — E1151 Type 2 diabetes mellitus with diabetic peripheral angiopathy without gangrene: Secondary | ICD-10-CM | POA: Diagnosis not present

## 2016-08-03 DIAGNOSIS — I5033 Acute on chronic diastolic (congestive) heart failure: Secondary | ICD-10-CM | POA: Diagnosis not present

## 2016-08-03 DIAGNOSIS — Z85828 Personal history of other malignant neoplasm of skin: Secondary | ICD-10-CM | POA: Diagnosis not present

## 2016-08-03 DIAGNOSIS — E1121 Type 2 diabetes mellitus with diabetic nephropathy: Secondary | ICD-10-CM | POA: Diagnosis not present

## 2016-08-03 DIAGNOSIS — Z7982 Long term (current) use of aspirin: Secondary | ICD-10-CM | POA: Diagnosis not present

## 2016-08-06 DIAGNOSIS — E1122 Type 2 diabetes mellitus with diabetic chronic kidney disease: Secondary | ICD-10-CM | POA: Diagnosis not present

## 2016-08-06 DIAGNOSIS — I4891 Unspecified atrial fibrillation: Secondary | ICD-10-CM | POA: Diagnosis not present

## 2016-08-06 DIAGNOSIS — Z7982 Long term (current) use of aspirin: Secondary | ICD-10-CM | POA: Diagnosis not present

## 2016-08-06 DIAGNOSIS — I2510 Atherosclerotic heart disease of native coronary artery without angina pectoris: Secondary | ICD-10-CM | POA: Diagnosis not present

## 2016-08-06 DIAGNOSIS — E1151 Type 2 diabetes mellitus with diabetic peripheral angiopathy without gangrene: Secondary | ICD-10-CM | POA: Diagnosis not present

## 2016-08-06 DIAGNOSIS — I5033 Acute on chronic diastolic (congestive) heart failure: Secondary | ICD-10-CM | POA: Diagnosis not present

## 2016-08-06 DIAGNOSIS — Z9641 Presence of insulin pump (external) (internal): Secondary | ICD-10-CM | POA: Diagnosis not present

## 2016-08-06 DIAGNOSIS — I130 Hypertensive heart and chronic kidney disease with heart failure and stage 1 through stage 4 chronic kidney disease, or unspecified chronic kidney disease: Secondary | ICD-10-CM | POA: Diagnosis not present

## 2016-08-06 DIAGNOSIS — Z794 Long term (current) use of insulin: Secondary | ICD-10-CM | POA: Diagnosis not present

## 2016-08-06 DIAGNOSIS — Z87891 Personal history of nicotine dependence: Secondary | ICD-10-CM | POA: Diagnosis not present

## 2016-08-06 DIAGNOSIS — E1121 Type 2 diabetes mellitus with diabetic nephropathy: Secondary | ICD-10-CM | POA: Diagnosis not present

## 2016-08-06 DIAGNOSIS — N183 Chronic kidney disease, stage 3 (moderate): Secondary | ICD-10-CM | POA: Diagnosis not present

## 2016-08-06 DIAGNOSIS — Z85828 Personal history of other malignant neoplasm of skin: Secondary | ICD-10-CM | POA: Diagnosis not present

## 2016-08-07 DIAGNOSIS — I5033 Acute on chronic diastolic (congestive) heart failure: Secondary | ICD-10-CM | POA: Diagnosis not present

## 2016-08-07 DIAGNOSIS — Z7982 Long term (current) use of aspirin: Secondary | ICD-10-CM | POA: Diagnosis not present

## 2016-08-07 DIAGNOSIS — Z794 Long term (current) use of insulin: Secondary | ICD-10-CM | POA: Diagnosis not present

## 2016-08-07 DIAGNOSIS — Z85828 Personal history of other malignant neoplasm of skin: Secondary | ICD-10-CM | POA: Diagnosis not present

## 2016-08-07 DIAGNOSIS — Z9641 Presence of insulin pump (external) (internal): Secondary | ICD-10-CM | POA: Diagnosis not present

## 2016-08-07 DIAGNOSIS — N183 Chronic kidney disease, stage 3 (moderate): Secondary | ICD-10-CM | POA: Diagnosis not present

## 2016-08-07 DIAGNOSIS — E1122 Type 2 diabetes mellitus with diabetic chronic kidney disease: Secondary | ICD-10-CM | POA: Diagnosis not present

## 2016-08-07 DIAGNOSIS — I4891 Unspecified atrial fibrillation: Secondary | ICD-10-CM | POA: Diagnosis not present

## 2016-08-07 DIAGNOSIS — Z87891 Personal history of nicotine dependence: Secondary | ICD-10-CM | POA: Diagnosis not present

## 2016-08-07 DIAGNOSIS — I130 Hypertensive heart and chronic kidney disease with heart failure and stage 1 through stage 4 chronic kidney disease, or unspecified chronic kidney disease: Secondary | ICD-10-CM | POA: Diagnosis not present

## 2016-08-07 DIAGNOSIS — E1151 Type 2 diabetes mellitus with diabetic peripheral angiopathy without gangrene: Secondary | ICD-10-CM | POA: Diagnosis not present

## 2016-08-07 DIAGNOSIS — E1121 Type 2 diabetes mellitus with diabetic nephropathy: Secondary | ICD-10-CM | POA: Diagnosis not present

## 2016-08-07 DIAGNOSIS — I2510 Atherosclerotic heart disease of native coronary artery without angina pectoris: Secondary | ICD-10-CM | POA: Diagnosis not present

## 2016-08-08 ENCOUNTER — Telehealth: Payer: Self-pay | Admitting: Endocrinology

## 2016-08-08 ENCOUNTER — Telehealth: Payer: Self-pay | Admitting: Emergency Medicine

## 2016-08-08 ENCOUNTER — Telehealth: Payer: Self-pay | Admitting: Cardiovascular Disease

## 2016-08-08 ENCOUNTER — Other Ambulatory Visit: Payer: Self-pay | Admitting: Internal Medicine

## 2016-08-08 DIAGNOSIS — I4891 Unspecified atrial fibrillation: Secondary | ICD-10-CM | POA: Diagnosis not present

## 2016-08-08 DIAGNOSIS — Z794 Long term (current) use of insulin: Secondary | ICD-10-CM | POA: Diagnosis not present

## 2016-08-08 DIAGNOSIS — E119 Type 2 diabetes mellitus without complications: Secondary | ICD-10-CM | POA: Diagnosis not present

## 2016-08-08 DIAGNOSIS — Z87891 Personal history of nicotine dependence: Secondary | ICD-10-CM | POA: Diagnosis not present

## 2016-08-08 DIAGNOSIS — Z85828 Personal history of other malignant neoplasm of skin: Secondary | ICD-10-CM | POA: Diagnosis not present

## 2016-08-08 DIAGNOSIS — E1151 Type 2 diabetes mellitus with diabetic peripheral angiopathy without gangrene: Secondary | ICD-10-CM | POA: Diagnosis not present

## 2016-08-08 DIAGNOSIS — I2510 Atherosclerotic heart disease of native coronary artery without angina pectoris: Secondary | ICD-10-CM | POA: Diagnosis not present

## 2016-08-08 DIAGNOSIS — Z7982 Long term (current) use of aspirin: Secondary | ICD-10-CM | POA: Diagnosis not present

## 2016-08-08 DIAGNOSIS — E1122 Type 2 diabetes mellitus with diabetic chronic kidney disease: Secondary | ICD-10-CM | POA: Diagnosis not present

## 2016-08-08 DIAGNOSIS — I130 Hypertensive heart and chronic kidney disease with heart failure and stage 1 through stage 4 chronic kidney disease, or unspecified chronic kidney disease: Secondary | ICD-10-CM | POA: Diagnosis not present

## 2016-08-08 DIAGNOSIS — E1121 Type 2 diabetes mellitus with diabetic nephropathy: Secondary | ICD-10-CM | POA: Diagnosis not present

## 2016-08-08 DIAGNOSIS — I5033 Acute on chronic diastolic (congestive) heart failure: Secondary | ICD-10-CM | POA: Diagnosis not present

## 2016-08-08 DIAGNOSIS — N183 Chronic kidney disease, stage 3 (moderate): Secondary | ICD-10-CM | POA: Diagnosis not present

## 2016-08-08 DIAGNOSIS — Z9641 Presence of insulin pump (external) (internal): Secondary | ICD-10-CM | POA: Diagnosis not present

## 2016-08-08 MED ORDER — INSULIN LISPRO 100 UNIT/ML (KWIKPEN): 5.0000 [IU] | PEN_INJECTOR | Freq: Three times a day (TID) | SUBCUTANEOUS | 11 refills | Status: DC

## 2016-08-08 NOTE — Telephone Encounter (Signed)
[facility] called and patients glucose was 335 this morning which is out normal limits. It was also high yesterday in the 300's and last night in the 500's. They would like you to advise what to do. Please give her a call back about this thanks.

## 2016-08-08 NOTE — Telephone Encounter (Signed)
Give 5 units of insulin for blood sugar over 200 Rx sent

## 2016-08-08 NOTE — Telephone Encounter (Signed)
Left detailed message with Magda Paganini.   Called home spoke to pt and son. Informed of MD recommendation below.

## 2016-08-08 NOTE — Telephone Encounter (Signed)
Please advise 

## 2016-08-08 NOTE — Telephone Encounter (Signed)
Dr Johnsie Cancel put patient on medication metolazone (ZAROXOLYN) 2.5 MG tablet for water to clear out stomach, it is  Running her b/s up last night 530,  20 min ago 570. Please advise

## 2016-08-08 NOTE — Telephone Encounter (Signed)
Informed patient that metolazone is a dieretic and should not be making her blood sugar that elevated. Informed patient that she needs to call her PCP or endocrinologist for her elevated blood glucose. Encouraged patient that she needs to call her doctor that treats her diabetes. Patient verbalized understanding and will call her PCP.

## 2016-08-08 NOTE — Telephone Encounter (Signed)
°  New Prob  Pt states she was recently started on Metolazone 2.5 mg. Pt feels this medication is making her blood sugars run high. Last reading taken just prior to calling at 570. This morning pt states her blood sugar was 335 and 530 last night prior to bedtime. Requesting to speak to a nurse. Please call.

## 2016-08-09 ENCOUNTER — Encounter: Payer: Self-pay | Admitting: Internal Medicine

## 2016-08-09 DIAGNOSIS — I130 Hypertensive heart and chronic kidney disease with heart failure and stage 1 through stage 4 chronic kidney disease, or unspecified chronic kidney disease: Secondary | ICD-10-CM | POA: Diagnosis not present

## 2016-08-09 DIAGNOSIS — E1151 Type 2 diabetes mellitus with diabetic peripheral angiopathy without gangrene: Secondary | ICD-10-CM | POA: Diagnosis not present

## 2016-08-09 DIAGNOSIS — I5033 Acute on chronic diastolic (congestive) heart failure: Secondary | ICD-10-CM | POA: Diagnosis not present

## 2016-08-09 DIAGNOSIS — Z9641 Presence of insulin pump (external) (internal): Secondary | ICD-10-CM | POA: Diagnosis not present

## 2016-08-09 DIAGNOSIS — E1122 Type 2 diabetes mellitus with diabetic chronic kidney disease: Secondary | ICD-10-CM | POA: Diagnosis not present

## 2016-08-09 DIAGNOSIS — I4891 Unspecified atrial fibrillation: Secondary | ICD-10-CM | POA: Diagnosis not present

## 2016-08-09 DIAGNOSIS — Z87891 Personal history of nicotine dependence: Secondary | ICD-10-CM | POA: Diagnosis not present

## 2016-08-09 DIAGNOSIS — Z7982 Long term (current) use of aspirin: Secondary | ICD-10-CM | POA: Diagnosis not present

## 2016-08-09 DIAGNOSIS — Z794 Long term (current) use of insulin: Secondary | ICD-10-CM | POA: Diagnosis not present

## 2016-08-09 DIAGNOSIS — I2510 Atherosclerotic heart disease of native coronary artery without angina pectoris: Secondary | ICD-10-CM | POA: Diagnosis not present

## 2016-08-09 DIAGNOSIS — E1121 Type 2 diabetes mellitus with diabetic nephropathy: Secondary | ICD-10-CM | POA: Diagnosis not present

## 2016-08-09 DIAGNOSIS — N183 Chronic kidney disease, stage 3 (moderate): Secondary | ICD-10-CM | POA: Diagnosis not present

## 2016-08-09 DIAGNOSIS — Z85828 Personal history of other malignant neoplasm of skin: Secondary | ICD-10-CM | POA: Diagnosis not present

## 2016-08-10 ENCOUNTER — Telehealth: Payer: Self-pay | Admitting: Cardiovascular Disease

## 2016-08-10 DIAGNOSIS — N183 Chronic kidney disease, stage 3 (moderate): Secondary | ICD-10-CM | POA: Diagnosis not present

## 2016-08-10 DIAGNOSIS — Z87891 Personal history of nicotine dependence: Secondary | ICD-10-CM | POA: Diagnosis not present

## 2016-08-10 DIAGNOSIS — E1121 Type 2 diabetes mellitus with diabetic nephropathy: Secondary | ICD-10-CM | POA: Diagnosis not present

## 2016-08-10 DIAGNOSIS — Z7982 Long term (current) use of aspirin: Secondary | ICD-10-CM | POA: Diagnosis not present

## 2016-08-10 DIAGNOSIS — I4891 Unspecified atrial fibrillation: Secondary | ICD-10-CM | POA: Diagnosis not present

## 2016-08-10 DIAGNOSIS — I2510 Atherosclerotic heart disease of native coronary artery without angina pectoris: Secondary | ICD-10-CM | POA: Diagnosis not present

## 2016-08-10 DIAGNOSIS — E1122 Type 2 diabetes mellitus with diabetic chronic kidney disease: Secondary | ICD-10-CM | POA: Diagnosis not present

## 2016-08-10 DIAGNOSIS — Z794 Long term (current) use of insulin: Secondary | ICD-10-CM | POA: Diagnosis not present

## 2016-08-10 DIAGNOSIS — Z85828 Personal history of other malignant neoplasm of skin: Secondary | ICD-10-CM | POA: Diagnosis not present

## 2016-08-10 DIAGNOSIS — I509 Heart failure, unspecified: Secondary | ICD-10-CM | POA: Diagnosis not present

## 2016-08-10 DIAGNOSIS — I130 Hypertensive heart and chronic kidney disease with heart failure and stage 1 through stage 4 chronic kidney disease, or unspecified chronic kidney disease: Secondary | ICD-10-CM | POA: Diagnosis not present

## 2016-08-10 DIAGNOSIS — I5033 Acute on chronic diastolic (congestive) heart failure: Secondary | ICD-10-CM | POA: Diagnosis not present

## 2016-08-10 DIAGNOSIS — E1151 Type 2 diabetes mellitus with diabetic peripheral angiopathy without gangrene: Secondary | ICD-10-CM | POA: Diagnosis not present

## 2016-08-10 DIAGNOSIS — Z9641 Presence of insulin pump (external) (internal): Secondary | ICD-10-CM | POA: Diagnosis not present

## 2016-08-10 NOTE — Telephone Encounter (Signed)
Called Cathy with [facility]. Patient has been taking metolazone every other day since prescribed, but today patient stated she would not be taking metolazone any more. Patient stated she did not like the side effects of this medication. Tye Maryland stated that patient is within normal limits per heart vest that measures fluid. Will forward to Dr. Johnsie Cancel, so he is aware.

## 2016-08-10 NOTE — Telephone Encounter (Signed)
Ok just keep track of [employer]

## 2016-08-10 NOTE — Telephone Encounter (Signed)
New message  [place], states FYI:  Pt took Metolozone 2.5 mg, 1 tab daily, pt has taken 2 doses  pt will not take it any more  Pt states it is too strong makes legs weak  Home health has vested her   Normal fluid levels today

## 2016-08-14 NOTE — Telephone Encounter (Signed)
Called Cathy with [facility] and left message that Dr. Johnsie Cancel wants patient to keep track of daily weights. Left number for Tye Maryland to call back if she has any questions.

## 2016-08-16 ENCOUNTER — Other Ambulatory Visit: Payer: Self-pay | Admitting: *Deleted

## 2016-08-16 MED ORDER — FUROSEMIDE 80 MG PO TABS: 80.0000 mg | ORAL_TABLET | Freq: Two times a day (BID) | ORAL | 2 refills | Status: DC

## 2016-08-17 ENCOUNTER — Telehealth: Payer: Self-pay | Admitting: Internal Medicine

## 2016-08-17 DIAGNOSIS — E1121 Type 2 diabetes mellitus with diabetic nephropathy: Secondary | ICD-10-CM | POA: Diagnosis not present

## 2016-08-17 DIAGNOSIS — Z9641 Presence of insulin pump (external) (internal): Secondary | ICD-10-CM | POA: Diagnosis not present

## 2016-08-17 DIAGNOSIS — I4891 Unspecified atrial fibrillation: Secondary | ICD-10-CM | POA: Diagnosis not present

## 2016-08-17 DIAGNOSIS — Z794 Long term (current) use of insulin: Secondary | ICD-10-CM | POA: Diagnosis not present

## 2016-08-17 DIAGNOSIS — E1151 Type 2 diabetes mellitus with diabetic peripheral angiopathy without gangrene: Secondary | ICD-10-CM | POA: Diagnosis not present

## 2016-08-17 DIAGNOSIS — I130 Hypertensive heart and chronic kidney disease with heart failure and stage 1 through stage 4 chronic kidney disease, or unspecified chronic kidney disease: Secondary | ICD-10-CM | POA: Diagnosis not present

## 2016-08-17 DIAGNOSIS — N183 Chronic kidney disease, stage 3 (moderate): Secondary | ICD-10-CM | POA: Diagnosis not present

## 2016-08-17 DIAGNOSIS — E1122 Type 2 diabetes mellitus with diabetic chronic kidney disease: Secondary | ICD-10-CM | POA: Diagnosis not present

## 2016-08-17 DIAGNOSIS — Z87891 Personal history of nicotine dependence: Secondary | ICD-10-CM | POA: Diagnosis not present

## 2016-08-17 DIAGNOSIS — I5033 Acute on chronic diastolic (congestive) heart failure: Secondary | ICD-10-CM | POA: Diagnosis not present

## 2016-08-17 DIAGNOSIS — I2510 Atherosclerotic heart disease of native coronary artery without angina pectoris: Secondary | ICD-10-CM | POA: Diagnosis not present

## 2016-08-17 DIAGNOSIS — Z7982 Long term (current) use of aspirin: Secondary | ICD-10-CM | POA: Diagnosis not present

## 2016-08-17 DIAGNOSIS — Z85828 Personal history of other malignant neoplasm of skin: Secondary | ICD-10-CM | POA: Diagnosis not present

## 2016-08-17 NOTE — Telephone Encounter (Signed)
Baker Janus contacted and given verbal okay per request.

## 2016-08-17 NOTE — Telephone Encounter (Signed)
Baker Janus, nurse from [facility] request verbal to go see Ms. Cullinane 1 time next week and continue to see her weekly visit after next week due to fluid difficulty. Please call  Her back

## 2016-08-21 ENCOUNTER — Telehealth: Payer: Self-pay | Admitting: Endocrinology

## 2016-08-21 NOTE — Telephone Encounter (Signed)
please call patient: Emma blood sugar was really high on the blood test that Dr Haroldine Laws did.  Is it stil high?  How are you feeling?

## 2016-08-22 ENCOUNTER — Encounter: Payer: Self-pay | Admitting: Internal Medicine

## 2016-08-22 DIAGNOSIS — Z794 Long term (current) use of insulin: Secondary | ICD-10-CM | POA: Diagnosis not present

## 2016-08-22 DIAGNOSIS — E1121 Type 2 diabetes mellitus with diabetic nephropathy: Secondary | ICD-10-CM | POA: Diagnosis not present

## 2016-08-22 DIAGNOSIS — E1151 Type 2 diabetes mellitus with diabetic peripheral angiopathy without gangrene: Secondary | ICD-10-CM | POA: Diagnosis not present

## 2016-08-22 DIAGNOSIS — Z7982 Long term (current) use of aspirin: Secondary | ICD-10-CM | POA: Diagnosis not present

## 2016-08-22 DIAGNOSIS — Z85828 Personal history of other malignant neoplasm of skin: Secondary | ICD-10-CM | POA: Diagnosis not present

## 2016-08-22 DIAGNOSIS — I4891 Unspecified atrial fibrillation: Secondary | ICD-10-CM | POA: Diagnosis not present

## 2016-08-22 DIAGNOSIS — I130 Hypertensive heart and chronic kidney disease with heart failure and stage 1 through stage 4 chronic kidney disease, or unspecified chronic kidney disease: Secondary | ICD-10-CM | POA: Diagnosis not present

## 2016-08-22 DIAGNOSIS — N183 Chronic kidney disease, stage 3 (moderate): Secondary | ICD-10-CM | POA: Diagnosis not present

## 2016-08-22 DIAGNOSIS — Z87891 Personal history of nicotine dependence: Secondary | ICD-10-CM | POA: Diagnosis not present

## 2016-08-22 DIAGNOSIS — E1122 Type 2 diabetes mellitus with diabetic chronic kidney disease: Secondary | ICD-10-CM | POA: Diagnosis not present

## 2016-08-22 DIAGNOSIS — I5033 Acute on chronic diastolic (congestive) heart failure: Secondary | ICD-10-CM | POA: Diagnosis not present

## 2016-08-22 DIAGNOSIS — I2510 Atherosclerotic heart disease of native coronary artery without angina pectoris: Secondary | ICD-10-CM | POA: Diagnosis not present

## 2016-08-22 DIAGNOSIS — Z9641 Presence of insulin pump (external) (internal): Secondary | ICD-10-CM | POA: Diagnosis not present

## 2016-08-22 LAB — BASIC METABOLIC PANEL
BUN: 49 mg/dL — AB (ref 4–21)
Creatinine: 1.4 mg/dL — AB (ref 0.5–1.1)
GLUCOSE: 280 mg/dL
Potassium: 4.6 mmol/L (ref 3.4–5.3)
Sodium: 136 mmol/L — AB (ref 137–147)

## 2016-08-22 NOTE — Telephone Encounter (Signed)
Attempted to reach the patient. Patient was not available and did not have a working voicemail. Will try again at a later time.  

## 2016-08-22 NOTE — Telephone Encounter (Signed)
I contacted the patient and advised of message. She stated she checked her blood sugar this am fasting at it was 200. Patient stated this blood sugar reading is not hight for her and she is feeling well. She did advise me her pump and supplies for the pump is being discontinued and she wanted to know what other alternatives she would have?  Pateint would like to stay on the insulin pump.

## 2016-08-22 NOTE — Telephone Encounter (Signed)
ov next available. what insulin(s) are you on now, and how much?

## 2016-08-23 ENCOUNTER — Other Ambulatory Visit: Payer: Medicare Other

## 2016-08-24 NOTE — Telephone Encounter (Signed)
Attempted to reach the patient. Patient was not available and did not have a working voicemail. Will try again at a later time.  

## 2016-08-27 NOTE — Telephone Encounter (Signed)
I contacted the patient and she and advised me she is taking 11 units before each meal of the humalog. Patient stated her blood sugars were good over the weekend and has scheduled her f/u appointment for 09/12/2016

## 2016-08-28 ENCOUNTER — Ambulatory Visit (INDEPENDENT_AMBULATORY_CARE_PROVIDER_SITE_OTHER): Payer: Medicare Other | Admitting: Internal Medicine

## 2016-08-28 ENCOUNTER — Ambulatory Visit: Payer: Medicare Other | Admitting: Internal Medicine

## 2016-08-28 ENCOUNTER — Encounter: Payer: Self-pay | Admitting: Internal Medicine

## 2016-08-28 ENCOUNTER — Other Ambulatory Visit (INDEPENDENT_AMBULATORY_CARE_PROVIDER_SITE_OTHER): Payer: Medicare Other

## 2016-08-28 VITALS — BP 140/62 | HR 69 | Temp 98.6°F | Wt 143.0 lb

## 2016-08-28 DIAGNOSIS — N183 Chronic kidney disease, stage 3 unspecified: Secondary | ICD-10-CM

## 2016-08-28 DIAGNOSIS — I10 Essential (primary) hypertension: Secondary | ICD-10-CM

## 2016-08-28 DIAGNOSIS — E119 Type 2 diabetes mellitus without complications: Secondary | ICD-10-CM

## 2016-08-28 DIAGNOSIS — E785 Hyperlipidemia, unspecified: Secondary | ICD-10-CM | POA: Diagnosis not present

## 2016-08-28 DIAGNOSIS — I5020 Unspecified systolic (congestive) heart failure: Secondary | ICD-10-CM

## 2016-08-28 LAB — CBC WITH DIFFERENTIAL/PLATELET
BASOS ABS: 0 10*3/uL (ref 0.0–0.1)
Basophils Relative: 0.2 % (ref 0.0–3.0)
EOS PCT: 2 % (ref 0.0–5.0)
Eosinophils Absolute: 0.2 10*3/uL (ref 0.0–0.7)
HEMATOCRIT: 39.5 % (ref 36.0–46.0)
Hemoglobin: 13.2 g/dL (ref 12.0–15.0)
LYMPHS PCT: 23.1 % (ref 12.0–46.0)
Lymphs Abs: 1.9 10*3/uL (ref 0.7–4.0)
MCHC: 33.4 g/dL (ref 30.0–36.0)
MCV: 90 fl (ref 78.0–100.0)
MONOS PCT: 11.9 % (ref 3.0–12.0)
Monocytes Absolute: 1 10*3/uL (ref 0.1–1.0)
NEUTROS ABS: 5.2 10*3/uL (ref 1.4–7.7)
Neutrophils Relative %: 62.8 % (ref 43.0–77.0)
PLATELETS: 182 10*3/uL (ref 150.0–400.0)
RBC: 4.39 Mil/uL (ref 3.87–5.11)
RDW: 19 % — ABNORMAL HIGH (ref 11.5–15.5)
WBC: 8.4 10*3/uL (ref 4.0–10.5)

## 2016-08-28 LAB — LIPID PANEL
CHOL/HDL RATIO: 3
CHOLESTEROL: 134 mg/dL (ref 0–200)
HDL: 46 mg/dL (ref >39.00)
LDL Cholesterol: 54 mg/dL (ref 0–99)
NonHDL: 87.8
TRIGLYCERIDES: 171 mg/dL — AB (ref 0.0–149.0)
VLDL: 34.2 mg/dL (ref 0.0–40.0)

## 2016-08-28 LAB — BASIC METABOLIC PANEL
BUN: 53 mg/dL — AB (ref 6–23)
CHLORIDE: 98 meq/L (ref 96–112)
CO2: 32 meq/L (ref 19–32)
CREATININE: 1.37 mg/dL — AB (ref 0.40–1.20)
Calcium: 10.4 mg/dL (ref 8.4–10.5)
GFR: 38.74 mL/min — ABNORMAL LOW (ref >60.00)
Glucose, Bld: 165 mg/dL — ABNORMAL HIGH (ref 70–99)
Potassium: 4.3 mEq/L (ref 3.5–5.1)
Sodium: 137 mEq/L (ref 135–145)

## 2016-08-28 LAB — TSH: TSH: 1.79 u[IU]/mL (ref 0.35–4.50)

## 2016-08-28 LAB — HEMOGLOBIN A1C: HEMOGLOBIN A1C: 8.9 % — AB (ref 4.6–6.5)

## 2016-08-28 NOTE — Progress Notes (Signed)
Subjective:  Patient ID: Emma Yu, female    DOB: 1927-07-26  Age: 83 y.o. MRN: [id number]  CC: Hypertension and Diabetes   HPI Emma Yu presents for f/up after a recent admission for CHF exacerbation with fluid overload, she was diuresed and has done well since discharge. She was then transferred to rehabilitation where she made significant improvement. [name] controlling her blood sugars with frequent insulin injections. She denies polyuria, polydipsia, or polyphagia. She has had no recent episodes of edema, chest pain, shortness of breath, palpitations, fatigue.  Outpatient Medications Prior to Visit  Medication Sig Dispense Refill  . acetaminophen (TYLENOL) 500 MG tablet Take 500-1,000 mg by mouth every 6 (six) hours as needed (for pain).     Marland Kitchen aspirin EC 81 MG tablet Take 81 mg by mouth 2 (two) times daily.    Marland Kitchen b complex vitamins tablet Take 1 tablet by mouth daily.      . calcium-vitamin D (OSCAL WITH D) 500-200 MG-UNIT tablet Take 1 tablet by mouth 2 (two) times daily.    . Cholecalciferol (VITAMIN D3) 2000 UNITS capsule Take 2,000 Units by mouth daily.      . fish oil-omega-3 fatty acids 1000 MG capsule Take 2 g by mouth 2 (two) times daily.     Marland Kitchen gabapentin (NEURONTIN) 100 MG capsule Take 2 capsules (200 mg total) by mouth at bedtime.    Marland Kitchen guaifenesin (GERI-TUSSIN) 100 MG/5ML syrup Take 5 mLs by mouth 4 (four) times daily as needed for cough.    . insulin lispro (HUMALOG) 100 UNIT/ML KiwkPen Inject 0.05 mLs (5 Units total) into the skin 3 (three) times daily. 15 mL 11  . metoprolol (LOPRESSOR) 100 MG tablet TAKE 1 TABLET (100 MG TOTAL) BY MOUTH 2 (TWO) TIMES DAILY. 60 tablet 11  . omeprazole (PRILOSEC) 40 MG capsule Take 1 capsule by mouth  daily before breakfast 90 capsule 2  . potassium chloride SA (K-DUR,KLOR-CON) 20 MEQ tablet Take 2 tablets (40 mEq total) by mouth daily.    . pravastatin (PRAVACHOL) 20 MG tablet TAKE 1 TABLET BY MOUTH IN THE EVENING  (Patient taking differently: Take 20 mg by mouth in the evening) 90 tablet 1  . Probiotic Product (PROBIOTIC PO) Take 1 tablet by mouth daily at 10 pm.    . traMADol (ULTRAM) 50 MG tablet Take 1 tablet (50 mg total) by mouth every 12 (twelve) hours as needed (for pain). 15 tablet 0  . amLODipine (NORVASC) 5 MG tablet Take 1 tablet (5 mg total) by mouth daily. 90 tablet 3  . furosemide (LASIX) 80 MG tablet Take 1 tablet (80 mg total) by mouth 2 (two) times daily. (Patient not taking: Reported on 08/28/2016) 180 tablet 2   No facility-administered medications prior to visit.     ROS Review of Systems  Constitutional: Negative for activity change, chills, diaphoresis, fatigue and unexpected weight change.  HENT: Negative.   Eyes: Negative for visual disturbance.  Respiratory: Negative for cough, chest tightness, shortness of breath and wheezing.   Cardiovascular: Negative for chest pain, palpitations and leg swelling.  Gastrointestinal: Negative for abdominal pain, constipation, diarrhea, nausea and vomiting.  Endocrine: Negative.  Negative for cold intolerance, heat intolerance, polydipsia, polyphagia and polyuria.  Genitourinary: Negative.  Negative for decreased urine volume, difficulty urinating, dysuria, flank pain, hematuria and urgency.  Musculoskeletal: Negative for back pain, myalgias and neck pain.  Skin: Negative.   Neurological: Negative.  Negative for dizziness, weakness, light-headedness and headaches.  Hematological:  Negative.  Negative for adenopathy. Does not bruise/bleed easily.  Psychiatric/Behavioral: Negative.     Objective:  BP 140/62   Pulse 69   Temp 98.6 F (37 C)   Wt 143 lb (64.9 kg)   SpO2 95%   BMI 24.55 kg/m   BP Readings from Last 3 Encounters:  08/28/16 140/62  08/02/16 130/60  07/10/16 135/75    Wt Readings from Last 3 Encounters:  08/28/16 143 lb (64.9 kg)  08/02/16 144 lb (65.3 kg)  07/10/16 139 lb 15.9 oz (63.5 kg)    Physical Exam    Constitutional: [name] oriented to person, place, and time. No distress.  HENT:  Mouth/Throat: Oropharynx is clear and moist. No oropharyngeal exudate.  Eyes: Conjunctivae are normal. Right eye exhibits no discharge. Left eye exhibits no discharge. No scleral icterus.  Neck: Normal range of motion. Neck supple. No JVD present. No tracheal deviation present. No thyromegaly present.  Cardiovascular: Normal rate, regular rhythm, S1 normal, S2 normal and intact distal pulses.  Exam reveals no gallop and no friction rub.   Murmur heard.  Systolic murmur is present with a grade of 1/6   No diastolic murmur is present  Pulmonary/Chest: Effort normal and breath sounds normal. No stridor. No respiratory distress. She has no wheezes. She has no rales. She exhibits no tenderness.  Abdominal: Soft. Bowel sounds are normal. She exhibits no distension and no mass. There is no tenderness. There is no rebound and no guarding.  Musculoskeletal: Normal range of motion. She exhibits no edema, tenderness or deformity.  Lymphadenopathy:    She has no cervical adenopathy.  Neurological: [name] oriented to person, place, and time.  Skin: Skin is warm and dry. No rash noted. [name] not diaphoretic. No erythema. No pallor.  Vitals reviewed.   Lab Results  Component Value Date   WBC 8.4 08/28/2016   HGB 13.2 08/28/2016   HCT 39.5 08/28/2016   PLT 182.0 08/28/2016   GLUCOSE 165 (H) 08/28/2016   CHOL 134 08/28/2016   TRIG 171.0 (H) 08/28/2016   HDL 46.00 08/28/2016   LDLCALC 54 08/28/2016   ALT 23 06/27/2014   AST 28 06/27/2014   NA 137 08/28/2016   K 4.3 08/28/2016   CL 98 08/28/2016   CREATININE 1.37 (H) 08/28/2016   BUN 53 (H) 08/28/2016   CO2 32 08/28/2016   TSH 1.79 08/28/2016   INR 1.18 12/25/2011   HGBA1C 8.9 (H) 08/28/2016   MICROALBUR 2.0 (H) 08/31/2015    Dg Chest 2 View  Result Date: 07/06/2016 CLINICAL DATA:  Shortness of breath and cough for 1 week. History of CHF and myocardial  infarction. Acute respiratory failure. History of smoking. EXAM: CHEST  2 VIEW COMPARISON:  07/04/2016 FINDINGS: The heart is enlarged. Interval development of significant bilateral airspace filling opacities, more confluent at the bases and partially obscuring hemidiaphragms. Kerley B-lines are present. There are small bilateral pleural effusions. IMPRESSION: Interval development of airspace filling and interstitial prominence, consistent with congestive heart failure. Small effusions. Electronically Signed   By: Nolon Nations M.D.   On: 07/06/2016 15:38    Assessment & Plan:   Dolores was seen today for hypertension and diabetes.  Diagnoses and all orders for this visit:  Chronic renal insufficiency, stage III (moderate)- She has had a significant decline in her renal function and she is prerenal so I've asked her to decrease her Demadex dose from twice a day to once a day. She'll also avoid nephrotoxic agents. -  Basic metabolic panel; Future  Diabetes mellitus type 2 in nonobese [facility])- her A1c is up to 8.9%, I have asked her and her caregiver to be more diligent with insulin injections. -     Basic metabolic panel; Future -     Hemoglobin A1c; Future  Essential hypertension, benign- her blood sugar is well controlled and electrolytes are normal. -     CBC with Differential/Platelet; Future -     Basic metabolic panel; Future  Hyperlipidemia with target LDL less than 70- she has achieved her LDL goal is doing well on the statin. -     Lipid panel; Future -     TSH; Future  Systolic congestive heart failure, unspecified congestive heart failure chronicity ([place])- she has a normal fluid status but is slightly prerenal so I have asked her to decrease her Demadex dose -     torsemide (DEMADEX) 20 MG tablet; Take 1 tablet (20 mg total) by mouth daily.   I have discontinued Ms. Hoffman's torsemide. I am also having her start on torsemide. Additionally, I am having her maintain her  Vitamin D3, b complex vitamins, fish oil-omega-3 fatty acids, aspirin EC, Probiotic Product (PROBIOTIC PO), metoprolol, omeprazole, pravastatin, amLODipine, acetaminophen, gabapentin, potassium chloride SA, traMADol, calcium-vitamin D, guaifenesin, insulin lispro, and furosemide.  Meds ordered this encounter  Medications  . DISCONTD: torsemide (DEMADEX) 20 MG tablet    Sig: Take 20 mg by mouth 2 (two) times daily.  Marland Kitchen torsemide (DEMADEX) 20 MG tablet    Sig: Take 1 tablet (20 mg total) by mouth daily.    Dispense:  90 tablet    Refill:  1     Follow-up: Return in about 6 months (around 02/25/2017).  [name], MD

## 2016-08-28 NOTE — Patient Instructions (Signed)
Type 1 Diabetes Mellitus, Diagnosis, Adult Type 1 diabetes (type 1 diabetes mellitus) is a long-term (chronic) disease. It occurs when the pancreas does not make enough of a hormone called insulin. Normally, insulin allows sugars (glucose) to enter cells in the body. The cells use glucose for energy. Lack of insulin causes excess glucose to build up in the blood instead of going into cells. As a result, high blood glucose (hyperglycemia) develops. The exact cause of type 1 diabetes is not known. There is currently no cure for type 1 diabetes, but it can be managed with insulin treatment and lifestyle changes. What increases the risk? You may be more likely to develop this condition if you have a family member who has type 1 diabetes. Other factors may also make you more likely to develop type 1 diabetes, such as:  Having a gene for type 1 diabetes that is passed along from parent to child (inherited).  Living in an area with cold weather conditions.  Exposure to certain viruses.  Certain conditions in which the body's disease-fighting (immune) system attacks itself (autoimmune disorders).  What are the signs or symptoms? Symptoms may develop gradually, over days or weeks, or they may develop suddenly. Symptoms may include:  Increased thirst (polydipsia).  Increased hunger(polyphagia).  Increased urination (polyuria).  Increased urination during the night (nocturia).  Sudden or unexplained weight changes.  Frequent infections that keep coming back (recurring).  Fatigue.  Weakness.  Vision changes, such as blurry vision.  Fruity-smelling breath.  Cuts or bruises that are slow to heal.  Tingling or numbness in the hands or feet.  How is this diagnosed?  This condition is diagnosed based on your symptoms, your medical history, a physical exam, and your blood glucose level. Your blood glucose may be checked with one or more of the following blood tests:  A fasting blood  glucose (FBG) test. You will not be allowed to eat (you will fast) for at least 8 hours before a blood sample is taken.  A random blood glucose test. This checks blood glucose at any time of day regardless of when you ate.  An A1c (hemoglobin A1c) blood test. This provides information about blood glucose control over the previous 2-3 months.  You may be diagnosed with type 1 diabetes if:  Your FBG level is 126 mg/dL (7.0 mmol/L) or higher.  Your random blood glucose level is 200 mg/dL (11.1 mmol/L) or higher.  Your A1c level is 6.5% or higher.  These blood tests may be repeated to confirm your diagnosis. How is this treated? Your treatment may be managed by a specialist called an endocrinologist. Type 1 diabetes can be managed by following instructions from your health care provider about:  Taking insulin daily. This helps to keep your blood glucose levels in the healthy range. ? You may need to adjust your insulin dosage based on how physically active you are and what foods you eat. Your health care provider will tell you how to do this.  Taking medicines to help prevent complications from diabetes, such as: ? Aspirin. ? Medicine to lower cholesterol. ? Medicine to control blood pressure.  Checking your blood glucose as often as directed.  Making diet and lifestyle changes. These may include: ? Following an individualized nutrition plan that is developed by a diet and nutrition specialist (registered dietitian). ? Exercising regularly. ? Finding ways to manage stress.  Your health care provider will set individualized treatment goals for you. Your goals will be based on   your age, other medical conditions you have, and how you respond to diabetes treatment. Generally, the goal of treatment is to maintain the following blood glucose levels:  Before meals (preprandial): 80-130 mg/dL (4.4-7.2 mmol/L).  After meals (postprandial): below 180 mg/dL (10 mmol/L).  A1c level: less than  7%.  Follow these instructions at home: Questions to Ask Your Health Care Provider   Consider asking the following questions: ? Do I need to meet with a diabetes educator? ? Should I consider joining a support group for people with diabetes? ? What equipment will I need to manage my diabetes at home? ? What diabetes medicines should I take, and when? ? How often should I check my blood glucose? ? What number should I call if I have questions? ? When is my next appointment? General instructions  Take over-the-counter and prescription medicines only as told by your health care provider.  Keep all follow-up visits as told by your health care provider. This is important.  For more information about diabetes, visit: ? American Diabetes Association (ADA): www.diabetes.org ? American Association of Diabetes Educators (AADE): www.diabeteseducator.org/patient-resources Contact a health care provider if:  Your blood glucose level is higher than 240 mg/dL (13.3 mmol/L) for 2 days in a row.  You have been sick or have had a fever for 2 days or more and you are not getting better.  You have any of the following problems for more than 6 hours: ? You cannot eat or drink. ? You have nausea and vomiting. ? You have diarrhea. Get help right away if:  Your blood glucose is below 54 mg/dL (3 mmol/L).  You become confused or you have trouble thinking clearly.  You have difficulty breathing.  You have moderate or large ketone levels in your urine. This information is not intended to replace advice given to you by your health care provider. Make sure you discuss any questions you have with your health care provider. Document Released: 08/03/2000 Document Revised: 01/12/2016 Document Reviewed: 09/09/2015 Elsevier Interactive Patient Education  2017 Elsevier Inc.  

## 2016-08-28 NOTE — Progress Notes (Signed)
Pre visit review using our clinic review tool, if applicable. No additional management support is needed unless otherwise documented below in the visit note. 

## 2016-08-29 ENCOUNTER — Encounter: Payer: Self-pay | Admitting: Internal Medicine

## 2016-08-29 DIAGNOSIS — I2510 Atherosclerotic heart disease of native coronary artery without angina pectoris: Secondary | ICD-10-CM | POA: Diagnosis not present

## 2016-08-29 DIAGNOSIS — I130 Hypertensive heart and chronic kidney disease with heart failure and stage 1 through stage 4 chronic kidney disease, or unspecified chronic kidney disease: Secondary | ICD-10-CM | POA: Diagnosis not present

## 2016-08-29 DIAGNOSIS — I5033 Acute on chronic diastolic (congestive) heart failure: Secondary | ICD-10-CM | POA: Diagnosis not present

## 2016-08-29 DIAGNOSIS — Z794 Long term (current) use of insulin: Secondary | ICD-10-CM | POA: Diagnosis not present

## 2016-08-29 DIAGNOSIS — N183 Chronic kidney disease, stage 3 (moderate): Secondary | ICD-10-CM | POA: Diagnosis not present

## 2016-08-29 DIAGNOSIS — Z87891 Personal history of nicotine dependence: Secondary | ICD-10-CM | POA: Diagnosis not present

## 2016-08-29 DIAGNOSIS — Z9641 Presence of insulin pump (external) (internal): Secondary | ICD-10-CM | POA: Diagnosis not present

## 2016-08-29 DIAGNOSIS — I4891 Unspecified atrial fibrillation: Secondary | ICD-10-CM | POA: Diagnosis not present

## 2016-08-29 DIAGNOSIS — E1122 Type 2 diabetes mellitus with diabetic chronic kidney disease: Secondary | ICD-10-CM | POA: Diagnosis not present

## 2016-08-29 DIAGNOSIS — Z7982 Long term (current) use of aspirin: Secondary | ICD-10-CM | POA: Diagnosis not present

## 2016-08-29 DIAGNOSIS — E1121 Type 2 diabetes mellitus with diabetic nephropathy: Secondary | ICD-10-CM | POA: Diagnosis not present

## 2016-08-29 DIAGNOSIS — E1151 Type 2 diabetes mellitus with diabetic peripheral angiopathy without gangrene: Secondary | ICD-10-CM | POA: Diagnosis not present

## 2016-08-29 DIAGNOSIS — Z85828 Personal history of other malignant neoplasm of skin: Secondary | ICD-10-CM | POA: Diagnosis not present

## 2016-08-29 MED ORDER — TORSEMIDE 20 MG PO TABS: 20.0000 mg | ORAL_TABLET | Freq: Every day | ORAL | 1 refills | Status: DC

## 2016-08-29 NOTE — Progress Notes (Signed)
Cardiology Office Note    Date:  08/30/2016   ID:  Emma Yu, DOB June 17, 1927, MRN [id number]  PCP:  [name], MD  Cardiologist:  Dr. Johnsie Cancel PV: Dr. Fletcher [name]  CC: follow up.   History of Present Illness:  Emma Yu is a 83 y.o. female with a history of diastolic CHF, diabetes, chronic atrial fibrillation (not a candidate for [place] 2/2 recurrent falls), CKD stage III, PAD s/p bilateral common iliac artery stenting in 2002 with known significant right SFA disease, HTN, and HLD who presents to clinic for follow up.   Admitted 10/11-10/17/17 and again 11/17-11/21/17 for A/C D CHF.  Diuresing her has been difficult given renal insufficiency.  2D ECHO 05/31/16 showed normal LV function, mild AR and severe MAC with mild MS.   She was seen by Dr. Johnsie Cancel on 08/02/16. Zaroxolyn was discontinued. A lot of her issues were felt to be 2/2 chronic venous Insufficiency.   She saw Dr. Ronnald Ramp yesterday who decreased her Torsemide from 45m BID to 269mdaily due to worsening renal function (Creat 1.37 and GF ~39).  Today she presents to clinic for follow up. No CP or SOB. LE edema looks best it has in a while, orthopnea or PND. No dizziness or syncope. No blood in stool or urine. No palpitations. She has a cough while moving around and nose dripping. Has been followed by the RED vest people and had impedance checked today and it was 30.     Past Medical History:  Diagnosis Date  . Acute respiratory failure ([facility]   12/2011 admission - decreased O2 sats on ambulation, improved by time of discharge  . Aortic stenosis    Moderate by echo 4/13 - mean 20 mmHg  . Arthritis    "back" (08/24/2015)  . Atrial fibrillation ([facility]   not a coumadin candidate secondary to fall risk  . CAD (coronary artery disease)    minimal plaque by cath 5/12  . CHF (congestive heart failure) (HCC)    EF 55-60%  . Chronic atrial fibrillation with RVR (HCC)   . Chronic diastolic heart failure (HCC)    Echocardiogram 12/10/11: Moderate LVH, EF 65-70%, moderate aortic stenosis, mean gradient 20, mild MS  . Chronic mid back pain   . DJD (degenerative joint disease) of hip    s/p R THR 10/2010  . DVT (deep venous thrombosis) ([facility]~ 2012   LLE  . History of blood transfusion 1957   "related to childbirth"  . Hyperlipidemia   . Hypertension   . Insulin pump in place   . Iron deficiency anemia   . Kidney stones ~ 1958   "no OR"  . Migraine    "none since my hysterectomy" (08/24/2015)  . Mitral stenosis    Mild by echo 4/13  . Myocardial infarction 12/2010  . PAD (peripheral artery disease) (HCC)    s/p bilateral comon iliac artery stenting in 2002. Known significant  R SFA  disease. carotid dz noted 11/2011 followed by Dr. ChBridgett Larsson. Skin cancer    right forehead/head  . Type II diabetes mellitus ([facility] 1999  . Venous insufficiency     Past Surgical History:  Procedure Laterality Date  . ABDOMINAL HYSTERECTOMY    . APPENDECTOMY    . BACK SURGERY    . CARDIAC CATHETERIZATION  01/10/2011   No significant CAD  . CATARACT EXTRACTION, BILATERAL Bilateral 2015  . CHOLECYSTECTOMY OPEN    . ILIAC ARTERY STENT Bilateral  2002   [place]  . JOINT REPLACEMENT    . [place] SURGERY  1999  . REVISION TOTAL HIP ARTHROPLASTY  1994  . SKIN CANCER EXCISION  2016   top of right forehead/head  . TONSILLECTOMY    . TOTAL HIP ARTHROPLASTY Right 1991    Current Medications: Outpatient Medications Prior to Visit  Medication Sig Dispense Refill  . acetaminophen (TYLENOL) 500 MG tablet Take 500-1,000 mg by mouth every 6 (six) hours as needed (for pain).     Marland Kitchen aspirin EC 81 MG tablet Take 81 mg by mouth 2 (two) times daily.    Marland Kitchen b complex vitamins tablet Take 1 tablet by mouth daily.      . calcium-vitamin D (OSCAL WITH D) 500-200 MG-UNIT tablet Take 1 tablet by mouth 2 (two) times daily.    . Cholecalciferol (VITAMIN D3) 2000 UNITS capsule Take 2,000 Units by mouth daily.      Marland Kitchen gabapentin  (NEURONTIN) 100 MG capsule Take 2 capsules (200 mg total) by mouth at bedtime.    Marland Kitchen guaifenesin (GERI-TUSSIN) 100 MG/5ML syrup Take 5 mLs by mouth 4 (four) times daily as needed for cough.    . metoprolol (LOPRESSOR) 100 MG tablet TAKE 1 TABLET (100 MG TOTAL) BY MOUTH 2 (TWO) TIMES DAILY. 60 tablet 11  . omeprazole (PRILOSEC) 40 MG capsule Take 1 capsule by mouth  daily before breakfast 90 capsule 2  . potassium chloride SA (K-DUR,KLOR-CON) 20 MEQ tablet Take 2 tablets (40 mEq total) by mouth daily.    . Probiotic Product (PROBIOTIC PO) Take 1 tablet by mouth daily at 10 pm.    . traMADol (ULTRAM) 50 MG tablet Take 1 tablet (50 mg total) by mouth every 12 (twelve) hours as needed (for pain). 15 tablet 0  . amLODipine (NORVASC) 5 MG tablet Take 1 tablet (5 mg total) by mouth daily. 90 tablet 3  . fish oil-omega-3 fatty acids 1000 MG capsule Take 2 g by mouth 2 (two) times daily.     . furosemide (LASIX) 80 MG tablet Take 1 tablet (80 mg total) by mouth 2 (two) times daily. (Patient not taking: Reported on 08/30/2016) 180 tablet 2  . insulin lispro (HUMALOG) 100 UNIT/ML KiwkPen Inject 0.05 mLs (5 Units total) into the skin 3 (three) times daily. (Patient not taking: Reported on 08/30/2016) 15 mL 11  . pravastatin (PRAVACHOL) 20 MG tablet TAKE 1 TABLET BY MOUTH IN THE EVENING (Patient not taking: Reported on 08/30/2016) 90 tablet 1  . torsemide (DEMADEX) 20 MG tablet Take 1 tablet (20 mg total) by mouth daily. (Patient not taking: Reported on 08/30/2016) 90 tablet 1   No facility-administered medications prior to visit.      Allergies:   Carvedilol; Augmentin [amoxicillin-pot clavulanate]; Clindamycin/lincomycin; Diltiazem hcl; Fenofibrate; Hctz [hydrochlorothiazide]; Nisoldipine; Nitroglycerin; Propranolol; Tekturna [aliskiren fumarate]; Valturna [aliskiren-valsartan]; Septra [sulfamethoxazole-trimethoprim]; and Sulfa antibiotics   Social History   Social History  . Marital status: Widowed    Spouse  name: N/A  . Number of children: N/A  . Years of education: N/A   Occupational History  . Retired         Social History Main Topics  . Smoking status: Former Smoker    Packs/day: 2.00    Years: 37.00    Types: Cigarettes    Quit date: 08/20/1985  . Smokeless tobacco: Never Used  . Alcohol use No  . Drug use: No  . Sexual activity: No   Other Topics Concern  .  None   Social History Narrative   Lives with son in [place], [place] smoking in 1987     Family History:  The patient's family history includes Diabetes in her father and other.      ROS:   Please see the history of present illness.    ROS All other systems reviewed and are negative.   PHYSICAL EXAM:   VS:  BP 130/60   Pulse 64   Ht _0  (1.626 m)   Wt 143 lb 12.8 oz (65.2 kg)   BMI 24.68 kg/m    GEN: Well nourished, well developed, in no acute distress, chronically ill appeaing HEENT: normal  Neck: no JVD, carotid bruits, or masses Cardiac: irreg irreg ; no murmurs, rubs, or gallops, 1+ LE edema  Respiratory:  clear to auscultation bilaterally, normal work of breathing GI: soft, nontender, nondistended, + BS MS: no deformity or atrophy  Skin: warm and dry, no rash Neuro:  Alert and Oriented x 3, Strength and sensation are intact Psych: euthymic mood, full affect   Wt Readings from Last 3 Encounters:  08/30/16 143 lb 12.8 oz (65.2 kg)  08/28/16 143 lb (64.9 kg)  08/02/16 144 lb (65.3 kg)      Studies/Labs Reviewed:   EKG:  EKG is NOT ordered today.  Recent Labs: 07/06/2016: B Natriuretic Peptide 230.2 08/28/2016: BUN 53; Creatinine, Ser 1.37; Hemoglobin 13.2; Platelets 182.0; Potassium 4.3; Sodium 137; TSH 1.79   Lipid Panel    Component Value Date/Time   CHOL 134 08/28/2016 1433   TRIG 171.0 (H) 08/28/2016 1433   HDL 46.00 08/28/2016 1433   CHOLHDL 3 08/28/2016 1433   VLDL 34.2 08/28/2016 1433   LDLCALC 54 08/28/2016 1433    Additional studies/ records that were  reviewed today include:  2D ECHO: 05/31/2016 LV EF: 65% -   70% Study Conclusions - Left ventricle: The cavity size was normal. Wall thickness was   increased in a pattern of mild LVH. Systolic function was   vigorous. The estimated ejection fraction was in the range of 65%   to 70%. Wall motion was normal; there were no regional wall   motion abnormalities. - Aortic valve: There was mild regurgitation. - Mitral valve: Severely calcified annulus. The findings are   consistent with mild stenosis. - Left atrium: The atrium was moderately dilated. - Right atrium: The atrium was mildly dilated. - Pericardium, extracardiac: There was a left pleural effusion. Impressions: - Vigorous LV systolic function; mild LVH; biatrial enlargement;   calcified aortic valve with mild AI; narrow LVOT with resting   gradient of 2 m/s; severe MAC with mild MS (mean gradient 6   mmHg); trace MR; mild TR.  ABIs 05/30/16 Summary: - Right ABIs indicate a mderate reduction in areial flow at rest. - Left ABIs indicate a mild r/eduction in arerial flow at rest. - There is no significant change since study of 06/2015 - If physician feels that a duplex scan is necessary it should be   scheduled at the [facility] at [facility].  ASSESSMENT & PLAN:    Chronic distaolic CHF: torsemide decreased from 57m BID to 279mdaily given renal insuffiencey. A lot of her LE edema felt to be from chronic venous insufficiency. Legs look good today. Appears euvolemic. [name] actually is taking the Torsemide BID (she doesn't remember it being decreased). With her recent admissions for CHF and good REDS vest impedance reading this AM, I  think staying on BID dosing and accepting a higher creatinine is acceptable. Will continue her on Torsemide BID for now.  Chronic atrial fibrillation: HR well controlled. Not a candidate for [facility] given frailty and falls  DM: continue current regimen followed by Dr. Ronnald Ramp.  HgA1c 8.9   AR/MR: mild by last echo.   CKD stage III: creat 1.3 on 08/29/15  PAD: s/p bilateral common iliac artery stenting in 2002 with known significant right SFA disease. Recent ABIs stable.     Medication Adjustments/Labs and Tests Ordered: Current medicines are reviewed at length with the patient today.  Concerns regarding medicines are outlined above.  Medication changes, Labs and Tests ordered today are listed in the Patient Instructions below. Patient Instructions  Medication Instructions:  Your physician recommends that you continue on your current medications as directed. Please refer to the Current Medication list given to you today.   Labwork: None ordered  Testing/Procedures: None ordered  Follow-Up: Your physician recommends that you schedule a follow-up appointment in: [place]   Any Other Special Instructions Will Be Listed Below (If Applicable).     If you need a refill on your cardiac medications before your next appointment, please call your pharmacy.      Mable Fill, PA-C  08/30/2016 7:00 PM    [address], [place], [place]  [address] Phone: [phone]; Fax: [phone]

## 2016-08-30 ENCOUNTER — Ambulatory Visit (INDEPENDENT_AMBULATORY_CARE_PROVIDER_SITE_OTHER): Payer: Medicare Other | Admitting: Physician Assistant

## 2016-08-30 ENCOUNTER — Encounter: Payer: Self-pay | Admitting: Physician Assistant

## 2016-08-30 VITALS — BP 130/60 | HR 64 | Ht 64.0 in | Wt 143.8 lb

## 2016-08-30 DIAGNOSIS — I5032 Chronic diastolic (congestive) heart failure: Secondary | ICD-10-CM

## 2016-08-30 DIAGNOSIS — N189 Chronic kidney disease, unspecified: Secondary | ICD-10-CM

## 2016-08-30 DIAGNOSIS — E118 Type 2 diabetes mellitus with unspecified complications: Secondary | ICD-10-CM | POA: Diagnosis not present

## 2016-08-30 DIAGNOSIS — I482 Chronic atrial fibrillation, unspecified: Secondary | ICD-10-CM

## 2016-08-30 DIAGNOSIS — I739 Peripheral vascular disease, unspecified: Secondary | ICD-10-CM | POA: Diagnosis not present

## 2016-08-30 NOTE — Patient Instructions (Signed)
Medication Instructions:  Your physician recommends that you continue on your current medications as directed. Please refer to the Current Medication list given to you today.   Labwork: None ordered  Testing/Procedures: None ordered  Follow-Up: Your physician recommends that you schedule a follow-up appointment in: [place]   Any Other Special Instructions Will Be Listed Below (If Applicable).     If you need a refill on your cardiac medications before your next appointment, please call your pharmacy.

## 2016-09-09 NOTE — Progress Notes (Signed)
Subjective:    Patient ID: Emma Yu, female    DOB: February 06, 1927, 83 y.o.   MRN: [id number]  HPI Pt returns for f/u of diabetes mellitus: DM type: 1 Dx'ed: [id number] Complications: CAD, nephropathy, and PAD.   Therapy: insulin since 2001 GDM: never DKA: never Severe hypoglycemia: never Pancreatitis: never Other: she has a one-touch ping insulin pump; she has declined continuous glucose monitor; son Nicole Kindred operates pump (not here for ov's).  Interval history: She averages a total of approx 60 units of humalog per day, via her pump.  She uses these settings (were increased 1 month ago): basal rate of 1.0 unit/hr, 24 hrs per day.   bolus of 11 units with each meal.   correction bolus (which some people call "sensitivity," or "insulin sensitivity ratio," or just "isr") of 1 unit for each by 25 which your glucose exceeds 120.  she brings a record of her cbg's which i have reviewed today.  It varies from 150-400.  There is no trend throughout the day.    Past Medical History:  Diagnosis Date  . Acute respiratory failure ([place])    12/2011 admission - decreased O2 sats on ambulation, improved by time of discharge  . Aortic stenosis    Moderate by echo 4/13 - mean 20 mmHg  . Arthritis    "back" (08/24/2015)  . Atrial fibrillation ([place])    not a coumadin candidate secondary to fall risk  . CAD (coronary artery disease)    minimal plaque by cath 5/12  . CHF (congestive heart failure) (HCC)    EF 55-60%  . Chronic atrial fibrillation with RVR (HCC)   . Chronic diastolic heart failure (HCC)    Echocardiogram 12/10/11: Moderate LVH, EF 65-70%, moderate aortic stenosis, mean gradient 20, mild MS  . Chronic mid back pain   . DJD (degenerative joint disease) of hip    s/p R THR 10/2010  . DVT (deep venous thrombosis) ([place]) ~ 2012   LLE  . History of blood transfusion 1957   "related to childbirth"  . Hyperlipidemia   . Hypertension   . Insulin pump in place   . Iron deficiency anemia   .  Kidney stones ~ 1958   "no OR"  . Migraine    "none since my hysterectomy" (08/24/2015)  . Mitral stenosis    Mild by echo 4/13  . Myocardial infarction 12/2010  . PAD (peripheral artery disease) (HCC)    s/p bilateral comon iliac artery stenting in 2002. Known significant  R SFA  disease. carotid dz noted 11/2011 followed by Dr. Bridgett Larsson  . Skin cancer    right forehead/head  . Type II diabetes mellitus ([place]) dx'd 1999  . Venous insufficiency     Past Surgical History:  Procedure Laterality Date  . ABDOMINAL HYSTERECTOMY    . APPENDECTOMY    . BACK SURGERY    . CARDIAC CATHETERIZATION  01/10/2011   No significant CAD  . CATARACT EXTRACTION, BILATERAL Bilateral 2015  . CHOLECYSTECTOMY OPEN    . ILIAC ARTERY STENT Bilateral 2002   [place]  . JOINT REPLACEMENT    . IXL SURGERY  1999  . REVISION TOTAL HIP ARTHROPLASTY  1994  . SKIN CANCER EXCISION  2016   top of right forehead/head  . TONSILLECTOMY    . TOTAL HIP ARTHROPLASTY Right 1991    Social History   Social History  . Marital status: Widowed    Spouse name: N/A  . Number of  children: N/A  . Years of education: N/A   Occupational History  . Retired         Social History Main Topics  . Smoking status: Former Smoker    Packs/day: 2.00    Years: 37.00    Types: Cigarettes    Quit date: 08/20/1985  . Smokeless tobacco: Never Used  . Alcohol use No  . Drug use: No  . Sexual activity: No   Other Topics Concern  . Not on file   Social History Narrative   Lives with son in [place], [place] smoking in 1987    Current Outpatient Prescriptions on File Prior to Visit  Medication Sig Dispense Refill  . acetaminophen (TYLENOL) 500 MG tablet Take 500-1,000 mg by mouth every 6 (six) hours as needed (for pain).     Marland Kitchen amLODipine (NORVASC) 5 MG tablet Take 5 mg by mouth daily.    Marland Kitchen aspirin EC 81 MG tablet Take 81 mg by mouth 2 (two) times daily.    Marland Kitchen b complex vitamins tablet Take 1 tablet  by mouth daily.      . calcium-vitamin D (OSCAL WITH D) 500-200 MG-UNIT tablet Take 1 tablet by mouth 2 (two) times daily.    . Cholecalciferol (VITAMIN D3) 2000 UNITS capsule Take 2,000 Units by mouth daily.      Marland Kitchen gabapentin (NEURONTIN) 100 MG capsule Take 2 capsules (200 mg total) by mouth at bedtime.    Marland Kitchen guaifenesin (GERI-TUSSIN) 100 MG/5ML syrup Take 5 mLs by mouth 4 (four) times daily as needed for cough.    . insulin lispro (HUMALOG) 100 UNIT/ML injection Inject 9 Units into the skin 3 (three) times daily before meals.    . metoprolol (LOPRESSOR) 100 MG tablet TAKE 1 TABLET (100 MG TOTAL) BY MOUTH 2 (TWO) TIMES DAILY. 60 tablet 11  . omeprazole (PRILOSEC) 40 MG capsule Take 1 capsule by mouth  daily before breakfast 90 capsule 2  . potassium chloride SA (K-DUR,KLOR-CON) 20 MEQ tablet Take 2 tablets (40 mEq total) by mouth daily.    . pravastatin (PRAVACHOL) 20 MG tablet Take 20 mg by mouth daily.    . Probiotic Product (PROBIOTIC PO) Take 1 tablet by mouth daily at 10 pm.    . traMADol (ULTRAM) 50 MG tablet Take 1 tablet (50 mg total) by mouth every 12 (twelve) hours as needed (for pain). 15 tablet 0   No current facility-administered medications on file prior to visit.     Allergies  Allergen Reactions  . Carvedilol Other (See Comments)    Heart stops CARDIAC ARREST  . Augmentin [Amoxicillin-Pot Clavulanate] Diarrhea  . Clindamycin/Lincomycin Other (See Comments)    tremors  . Diltiazem Hcl Other (See Comments)    Chest pain;   07/02/14-  Patient has been able to tolerate diltiazem PO as inpatient since 06/29/14 without any adverse reaction.  . Fenofibrate Other (See Comments)    Unknown - [facility] - MAR  . Hctz [Hydrochlorothiazide] Other (See Comments)    Chest pain  . Nisoldipine Other (See Comments)    Chest pain  . Nitroglycerin Other (See Comments)    Unknown - NH MAR  . Propranolol Diarrhea    Chest pain  . Tekturna [Aliskiren Fumarate] Other (See Comments)    Unknown  reaction  . Valturna [Aliskiren-Valsartan] Other (See Comments)    Unknown reaction  . Septra [Sulfamethoxazole-Trimethoprim] Rash    Rash, cyst  . Sulfa Antibiotics Rash  Family History  Problem Relation Age of Onset  . Diabetes Father   . Diabetes Other     5/8 sibs    BP 128/78   Pulse 69   Ht 5\' 4"  (1.626 m)   Wt 142 lb (64.4 kg)   SpO2 95%   BMI 24.37 kg/m    Review of Systems She denies hypoglycemia    Objective:   Physical Exam VITAL SIGNS:  See vs page GENERAL: no distress CV: trace bilat leg edema, and bilat vv's.   Pulses: dorsalis pedis are absent bilaterally.   SKIN: There is mild (chronic) erythema of the legs, but no ulcer. There is rusty discoloration on the legs and feet.  Ext: no deformity.  There is severe bilateral onychomycosis.  Neuro: sensation is intact to touch on the feet.   Lab Results  Component Value Date   HGBA1C 8.9 (H) 08/28/2016      Assessment & Plan:  Frail elderly state: in this setting, she is not a candidate for aggressive glycemic control. Type 1 DM: worse Hypoglycemia, mild.  As she has had just 1 episode, we'll follow for now.   Patient is advised the following: Patient Instructions  Please continue:  basal rate of 1.0 unit/hr, 24 hrs per day.   bolus of 11 units with each meal.  correction bolus (which some people call "sensitivity," or "insulin sensitivity ratio," or just "isr") of 1 unit for each by 25 which your glucose exceeds 120.  blood tests are requested for you today.  We'll let you know about the results.  check your blood sugar 4 times a day--before the 3 meals, and at bedtime.  also check if you have symptoms of your blood sugar being too high or too low.  please keep a record of the readings and bring it to your next appointment here.  please call us sooner if you are having low blood sugar episodes.   Please make a follow-up appointment in 4 months.

## 2016-09-12 ENCOUNTER — Encounter: Payer: Self-pay | Admitting: Cardiovascular Disease

## 2016-09-12 ENCOUNTER — Encounter: Payer: Self-pay | Admitting: Endocrinology

## 2016-09-12 ENCOUNTER — Ambulatory Visit (INDEPENDENT_AMBULATORY_CARE_PROVIDER_SITE_OTHER): Payer: Medicare Other | Admitting: Endocrinology

## 2016-09-12 VITALS — BP 128/78 | HR 69 | Ht 64.0 in | Wt 142.0 lb

## 2016-09-12 DIAGNOSIS — E1051 Type 1 diabetes mellitus with diabetic peripheral angiopathy without gangrene: Secondary | ICD-10-CM

## 2016-09-12 NOTE — Patient Instructions (Addendum)
Please continue:  basal rate of 1.0 unit/hr, 24 hrs per day.   bolus of 11 units with each meal.  correction bolus (which some people call "sensitivity," or "insulin sensitivity ratio," or just "isr") of 1 unit for each by 25 which your glucose exceeds 120.  blood tests are requested for you today.  We'll let you know about the results.  check your blood sugar 4 times a day--before the 3 meals, and at bedtime.  also check if you have symptoms of your blood sugar being too high or too low.  please keep a record of the readings and bring it to your next appointment here.  please call us sooner if you are having low blood sugar episodes.   Please make a follow-up appointment in 4 months.

## 2016-09-13 ENCOUNTER — Other Ambulatory Visit: Payer: Self-pay | Admitting: Internal Medicine

## 2016-09-13 DIAGNOSIS — N183 Chronic kidney disease, stage 3 (moderate): Secondary | ICD-10-CM | POA: Diagnosis not present

## 2016-09-13 DIAGNOSIS — E1122 Type 2 diabetes mellitus with diabetic chronic kidney disease: Secondary | ICD-10-CM | POA: Diagnosis not present

## 2016-09-13 DIAGNOSIS — I130 Hypertensive heart and chronic kidney disease with heart failure and stage 1 through stage 4 chronic kidney disease, or unspecified chronic kidney disease: Secondary | ICD-10-CM | POA: Diagnosis not present

## 2016-09-13 DIAGNOSIS — Z85828 Personal history of other malignant neoplasm of skin: Secondary | ICD-10-CM | POA: Diagnosis not present

## 2016-09-13 DIAGNOSIS — Z794 Long term (current) use of insulin: Secondary | ICD-10-CM | POA: Diagnosis not present

## 2016-09-13 DIAGNOSIS — I5033 Acute on chronic diastolic (congestive) heart failure: Secondary | ICD-10-CM | POA: Diagnosis not present

## 2016-09-13 DIAGNOSIS — E1121 Type 2 diabetes mellitus with diabetic nephropathy: Secondary | ICD-10-CM | POA: Diagnosis not present

## 2016-09-13 DIAGNOSIS — Z7982 Long term (current) use of aspirin: Secondary | ICD-10-CM | POA: Diagnosis not present

## 2016-09-13 DIAGNOSIS — I2510 Atherosclerotic heart disease of native coronary artery without angina pectoris: Secondary | ICD-10-CM | POA: Diagnosis not present

## 2016-09-13 DIAGNOSIS — Z9641 Presence of insulin pump (external) (internal): Secondary | ICD-10-CM | POA: Diagnosis not present

## 2016-09-13 DIAGNOSIS — E1151 Type 2 diabetes mellitus with diabetic peripheral angiopathy without gangrene: Secondary | ICD-10-CM | POA: Diagnosis not present

## 2016-09-13 DIAGNOSIS — I4891 Unspecified atrial fibrillation: Secondary | ICD-10-CM | POA: Diagnosis not present

## 2016-09-13 DIAGNOSIS — Z87891 Personal history of nicotine dependence: Secondary | ICD-10-CM | POA: Diagnosis not present

## 2016-09-14 LAB — FRUCTOSAMINE: Fructosamine: 384 umol/L — ABNORMAL HIGH (ref 190–270)

## 2016-09-18 ENCOUNTER — Encounter: Payer: Medicare Other | Attending: Endocrinology | Admitting: Nutrition

## 2016-09-18 DIAGNOSIS — E1051 Type 1 diabetes mellitus with diabetic peripheral angiopathy without gangrene: Secondary | ICD-10-CM

## 2016-09-19 ENCOUNTER — Other Ambulatory Visit: Payer: Self-pay | Admitting: *Deleted

## 2016-09-19 DIAGNOSIS — Z794 Long term (current) use of insulin: Secondary | ICD-10-CM | POA: Diagnosis not present

## 2016-09-19 DIAGNOSIS — I5033 Acute on chronic diastolic (congestive) heart failure: Secondary | ICD-10-CM | POA: Diagnosis not present

## 2016-09-19 DIAGNOSIS — I2510 Atherosclerotic heart disease of native coronary artery without angina pectoris: Secondary | ICD-10-CM | POA: Diagnosis not present

## 2016-09-19 DIAGNOSIS — E1121 Type 2 diabetes mellitus with diabetic nephropathy: Secondary | ICD-10-CM | POA: Diagnosis not present

## 2016-09-19 DIAGNOSIS — E1122 Type 2 diabetes mellitus with diabetic chronic kidney disease: Secondary | ICD-10-CM | POA: Diagnosis not present

## 2016-09-19 DIAGNOSIS — I130 Hypertensive heart and chronic kidney disease with heart failure and stage 1 through stage 4 chronic kidney disease, or unspecified chronic kidney disease: Secondary | ICD-10-CM | POA: Diagnosis not present

## 2016-09-19 DIAGNOSIS — E1151 Type 2 diabetes mellitus with diabetic peripheral angiopathy without gangrene: Secondary | ICD-10-CM | POA: Diagnosis not present

## 2016-09-19 DIAGNOSIS — I4891 Unspecified atrial fibrillation: Secondary | ICD-10-CM | POA: Diagnosis not present

## 2016-09-19 DIAGNOSIS — N183 Chronic kidney disease, stage 3 (moderate): Secondary | ICD-10-CM | POA: Diagnosis not present

## 2016-09-19 DIAGNOSIS — Z85828 Personal history of other malignant neoplasm of skin: Secondary | ICD-10-CM | POA: Diagnosis not present

## 2016-09-19 DIAGNOSIS — Z7982 Long term (current) use of aspirin: Secondary | ICD-10-CM | POA: Diagnosis not present

## 2016-09-19 DIAGNOSIS — Z9641 Presence of insulin pump (external) (internal): Secondary | ICD-10-CM | POA: Diagnosis not present

## 2016-09-19 DIAGNOSIS — Z87891 Personal history of nicotine dependence: Secondary | ICD-10-CM | POA: Diagnosis not present

## 2016-09-19 MED ORDER — POTASSIUM CHLORIDE CRYS ER 20 MEQ PO TBCR: 40.0000 meq | EXTENDED_RELEASE_TABLET | Freq: Every day | ORAL | 3 refills | Status: DC

## 2016-09-19 NOTE — Progress Notes (Signed)
Patient is here with her son to review the different insulin pumps.  [name] wearing an Animas pump and her son must help her with the cartridge and infusion set changes, because she can not remember the steps.  She thinks the Medtronic pump is too big and very confusing to her because of all the menu options.  She was shown the OmniPOd and Tandem pump and was told that she could try them before deciding.  Questions were answered about cost and they were given brochures on each model.   She decided to try the OmniPod for 14 days before deciding.  Paperwork was filled out and faxed to [employer]

## 2016-09-24 ENCOUNTER — Ambulatory Visit: Payer: Medicare Other | Admitting: Endocrinology

## 2016-10-01 ENCOUNTER — Other Ambulatory Visit: Payer: Self-pay | Admitting: Endocrinology

## 2016-10-01 MED ORDER — FREESTYLE LIBRE SENSOR SYSTEM MISC: 1.0000 | 3 refills | Status: AC

## 2016-10-03 ENCOUNTER — Other Ambulatory Visit: Payer: Self-pay | Admitting: Internal Medicine

## 2016-10-03 DIAGNOSIS — N183 Chronic kidney disease, stage 3 (moderate): Secondary | ICD-10-CM | POA: Diagnosis not present

## 2016-10-03 DIAGNOSIS — E1151 Type 2 diabetes mellitus with diabetic peripheral angiopathy without gangrene: Secondary | ICD-10-CM | POA: Diagnosis not present

## 2016-10-03 DIAGNOSIS — E1051 Type 1 diabetes mellitus with diabetic peripheral angiopathy without gangrene: Secondary | ICD-10-CM

## 2016-10-03 DIAGNOSIS — E1121 Type 2 diabetes mellitus with diabetic nephropathy: Secondary | ICD-10-CM | POA: Diagnosis not present

## 2016-10-03 DIAGNOSIS — I4891 Unspecified atrial fibrillation: Secondary | ICD-10-CM | POA: Diagnosis not present

## 2016-10-03 DIAGNOSIS — Z9641 Presence of insulin pump (external) (internal): Secondary | ICD-10-CM | POA: Diagnosis not present

## 2016-10-03 DIAGNOSIS — Z7982 Long term (current) use of aspirin: Secondary | ICD-10-CM | POA: Diagnosis not present

## 2016-10-03 DIAGNOSIS — I2510 Atherosclerotic heart disease of native coronary artery without angina pectoris: Secondary | ICD-10-CM | POA: Diagnosis not present

## 2016-10-03 DIAGNOSIS — Z794 Long term (current) use of insulin: Secondary | ICD-10-CM | POA: Diagnosis not present

## 2016-10-03 DIAGNOSIS — I5033 Acute on chronic diastolic (congestive) heart failure: Secondary | ICD-10-CM | POA: Diagnosis not present

## 2016-10-03 DIAGNOSIS — Z85828 Personal history of other malignant neoplasm of skin: Secondary | ICD-10-CM | POA: Diagnosis not present

## 2016-10-03 DIAGNOSIS — I130 Hypertensive heart and chronic kidney disease with heart failure and stage 1 through stage 4 chronic kidney disease, or unspecified chronic kidney disease: Secondary | ICD-10-CM | POA: Diagnosis not present

## 2016-10-03 DIAGNOSIS — E1122 Type 2 diabetes mellitus with diabetic chronic kidney disease: Secondary | ICD-10-CM | POA: Diagnosis not present

## 2016-10-03 DIAGNOSIS — Z87891 Personal history of nicotine dependence: Secondary | ICD-10-CM | POA: Diagnosis not present

## 2016-10-07 NOTE — Progress Notes (Signed)
Cardiology Office Note    Date:  10/17/2016   ID:  HURLEY BLEVINS, DOB Feb 19, 1927, MRN [id number]  PCP:  [name], MD  Cardiologist:  Dr. Johnsie Cancel PV: Dr. Fletcher [name]  CC: follow up.   History of Present Illness:  Emma Yu is a 83 y.o. female with a history of diastolic CHF, diabetes, chronic atrial fibrillation (not a candidate for [facility] 2/2 recurrent falls), CKD stage III, PAD s/p bilateral common iliac artery stenting in 2002 with known significant right SFA disease, HTN, and HLD who presents to clinic for follow up.   Admitted 10/11-10/17/17 and again 11/17-11/21/17 for A/C D CHF.  Diuresing her has been difficult given renal insufficiency.  2D ECHO 05/31/16 showed normal LV function, mild AR and severe MAC with mild MS.   She was seen on 08/02/16. Zaroxolyn was discontinued. A lot of her issues were felt to be 2/2 chronic venous Insufficiency.     Today she presents to clinic for follow up. No CP or SOB. LE edema looks best it has in a while, orthopnea or PND. No dizziness or syncope. No blood in stool or urine. No palpitations. She has a cough while moving around and nose dripping. Has been followed by the RED vest people and had impedance checked today and it was 30.     Past Medical History:  Diagnosis Date  . Acute respiratory failure ([place])    12/2011 admission - decreased O2 sats on ambulation, improved by time of discharge  . Aortic stenosis    Moderate by echo 4/13 - mean 20 mmHg  . Arthritis    "back" (08/24/2015)  . Atrial fibrillation ([place])    not a coumadin candidate secondary to fall risk  . CAD (coronary artery disease)    minimal plaque by cath 5/12  . CHF (congestive heart failure) (HCC)    EF 55-60%  . Chronic atrial fibrillation with RVR (HCC)   . Chronic diastolic heart failure (HCC)    Echocardiogram 12/10/11: Moderate LVH, EF 65-70%, moderate aortic stenosis, mean gradient 20, mild MS  . Chronic mid back pain   . DJD (degenerative joint  disease) of hip    s/p R THR 10/2010  . DVT (deep venous thrombosis) ([place]) ~ 2012   LLE  . History of blood transfusion 1957   "related to childbirth"  . Hyperlipidemia   . Hypertension   . Insulin pump in place   . Iron deficiency anemia   . Kidney stones ~ 1958   "no OR"  . Migraine    "none since my hysterectomy" (08/24/2015)  . Mitral stenosis    Mild by echo 4/13  . Myocardial infarction 12/2010  . PAD (peripheral artery disease) (HCC)    s/p bilateral comon iliac artery stenting in 2002. Known significant  R SFA  disease. carotid dz noted 11/2011 followed by Dr. Bridgett Larsson  . Skin cancer    right forehead/head  . Type II diabetes mellitus ([place]) dx'd 1999  . Venous insufficiency     Past Surgical History:  Procedure Laterality Date  . ABDOMINAL HYSTERECTOMY    . APPENDECTOMY    . BACK SURGERY    . CARDIAC CATHETERIZATION  01/10/2011   No significant CAD  . CATARACT EXTRACTION, BILATERAL Bilateral 2015  . CHOLECYSTECTOMY OPEN    . ILIAC ARTERY STENT Bilateral 2002   [place]  . JOINT REPLACEMENT    . [place] SURGERY  1999  . REVISION TOTAL HIP ARTHROPLASTY  1994  .  SKIN CANCER EXCISION  2016   top of right forehead/head  . TONSILLECTOMY    . TOTAL HIP ARTHROPLASTY Right 1991    Current Medications: Outpatient Medications Prior to Visit  Medication Sig Dispense Refill  . acetaminophen (TYLENOL) 500 MG tablet Take 500-1,000 mg by mouth every 6 (six) hours as needed (for pain).     Marland Kitchen amLODipine (NORVASC) 5 MG tablet Take 5 mg by mouth daily.    Marland Kitchen aspirin EC 81 MG tablet Take 81 mg by mouth 2 (two) times daily.    Marland Kitchen b complex vitamins tablet Take 1 tablet by mouth daily.      . calcium-vitamin D (OSCAL WITH D) 500-200 MG-UNIT tablet Take 1 tablet by mouth 2 (two) times daily.    . Cholecalciferol (VITAMIN D3) 2000 UNITS capsule Take 2,000 Units by mouth daily.      . Continuous Blood Gluc Sensor (FREESTYLE LIBRE SENSOR SYSTEM) MISC 1 Device by Does not apply route as  directed. 1 device every 10 days 10 each 3  . guaifenesin (GERI-TUSSIN) 100 MG/5ML syrup Take 5 mLs by mouth 4 (four) times daily as needed for cough.    . insulin lispro (HUMALOG) 100 UNIT/ML injection Inject 11 Units into the skin 3 (three) times daily before meals.     . metoprolol (LOPRESSOR) 100 MG tablet TAKE 1 TABLET (100 MG TOTAL) BY MOUTH 2 (TWO) TIMES DAILY. 60 tablet 11  . omeprazole (PRILOSEC) 40 MG capsule Take 1 capsule by mouth  daily before breakfast 90 capsule 2  . potassium chloride SA (K-DUR,KLOR-CON) 20 MEQ tablet Take 2 tablets (40 mEq total) by mouth daily. 180 tablet 3  . pravastatin (PRAVACHOL) 20 MG tablet Take 20 mg by mouth daily.    . pravastatin (PRAVACHOL) 20 MG tablet TAKE 1 TABLET BY MOUTH IN THE EVENING 90 tablet 1  . Probiotic Product (PROBIOTIC PO) Take 1 tablet by mouth daily at 10 pm.    . torsemide (DEMADEX) 20 MG tablet Take 2 tablets (40 mg total) by mouth 2 (two) times daily. 120 tablet 3  . traMADol (ULTRAM) 50 MG tablet Take 1 tablet (50 mg total) by mouth every 12 (twelve) hours as needed (for pain). 15 tablet 0  . HUMALOG 100 UNIT/ML injection INJECT SUBCUTANEOUSLY VIA  INSULIN PUMP FOR A TOTAL OF 70 UNITS PER DAY 70 mL 11   No facility-administered medications prior to visit.      Allergies:   Carvedilol; Augmentin [amoxicillin-pot clavulanate]; Clindamycin/lincomycin; Diltiazem hcl; Fenofibrate; Hctz [hydrochlorothiazide]; Nisoldipine; Nitroglycerin; Propranolol; Tekturna [aliskiren fumarate]; Valturna [aliskiren-valsartan]; Septra [sulfamethoxazole-trimethoprim]; and Sulfa antibiotics   Social History   Social History  . Marital status: Widowed    Spouse name: N/A  . Number of children: N/A  . Years of education: N/A   Occupational History  . Retired         Social History Main Topics  . Smoking status: Former Smoker    Packs/day: 2.00    Years: 37.00    Types: Cigarettes    Quit date: 08/20/1985  . Smokeless tobacco: Never Used  .  Alcohol use No  . Drug use: No  . Sexual activity: No   Other Topics Concern  . None   Social History Narrative   Lives with son in [place], [place] smoking in 1987     Family History:  The patient's family history includes Diabetes in her father and other.      ROS:  Please see the history of present illness.    ROS All other systems reviewed and are negative.   PHYSICAL EXAM:   VS:  BP (!) 150/60   Pulse 60   Ht 5' (1.524 m)   Wt 141 lb (64 kg)   SpO2 96%   BMI 27.54 kg/m    GEN: Well nourished, well developed, in no acute distress, chronically ill appeaing HEENT: normal  Neck: no JVD, carotid bruits, or masses Cardiac: irreg irreg ; SEM murmurs, rubs, or gallops, 1+ LE edema  Respiratory:  clear to auscultation bilaterally, normal work of breathing GI: soft, nontender, nondistended, + BS MS: no deformity or atrophy  Skin: warm and dry, no rash Neuro:  Alert and Oriented x 3, Strength and sensation are intact Psych: euthymic mood, full affect Plus one bilateral edema stasis changes improved   Wt Readings from Last 3 Encounters:  10/17/16 141 lb (64 kg)  09/12/16 142 lb (64.4 kg)  08/30/16 143 lb 12.8 oz (65.2 kg)      Studies/Labs Reviewed:   EKG:  EKG is NOT ordered today.  Recent Labs: 07/06/2016: B Natriuretic Peptide 230.2 08/28/2016: BUN 53; Creatinine, Ser 1.37; Hemoglobin 13.2; Platelets 182.0; Potassium 4.3; Sodium 137; TSH 1.79   Lipid Panel    Component Value Date/Time   CHOL 134 08/28/2016 1433   TRIG 171.0 (H) 08/28/2016 1433   HDL 46.00 08/28/2016 1433   CHOLHDL 3 08/28/2016 1433   VLDL 34.2 08/28/2016 1433   LDLCALC 54 08/28/2016 1433    Additional studies/ records that were reviewed today include:  2D ECHO: 05/31/2016 LV EF: 65% -   70% Study Conclusions - Left ventricle: The cavity size was normal. Wall thickness was   increased in a pattern of mild LVH. Systolic function was   vigorous. The estimated  ejection fraction was in the range of 65%   to 70%. Wall motion was normal; there were no regional wall   motion abnormalities. - Aortic valve: There was mild regurgitation. - Mitral valve: Severely calcified annulus. The findings are   consistent with mild stenosis. - Left atrium: The atrium was moderately dilated. - Right atrium: The atrium was mildly dilated. - Pericardium, extracardiac: There was a left pleural effusion. Impressions: - Vigorous LV systolic function; mild LVH; biatrial enlargement;   calcified aortic valve with mild AI; narrow LVOT with resting   gradient of 2 m/s; severe MAC with mild MS (mean gradient 6   mmHg); trace MR; mild TR.  ABIs 05/30/16 Summary: - Right ABIs indicate a mderate reduction in areial flow at rest. - Left ABIs indicate a mild r/eduction in arerial flow at rest. - There is no significant change since study of 06/2015 - If physician feels that a duplex scan is necessary it should be   scheduled at the [facility] at [facility].  ASSESSMENT & PLAN:    Chronic distaolic CHF:  A lot of her LE edema felt to be from chronic venous insufficiency. Legs look good today. Appears euvolemic. [name] actually is taking the Torsemide BID (she doesn't remember it being decreased). With her recent admissions for CHF and good REDS vest impedance reading this AM, I think staying on BID dosing and accepting a higher creatinine is acceptable. Will continue her on Torsemide BID for now.  Chronic atrial fibrillation: HR well controlled. Not a candidate for [facility] given frailty and falls  DM: continue current regimen followed by Dr. Ronnald Ramp. HgA1c 8.9   AR/MR: mild  by last echo.   CKD stage III: creat 1.37  on 08/28/16  PAD: s/p bilateral common iliac artery stenting in 2002 with known significant right SFA disease. Recent ABIs stable.     Jenkins Rouge

## 2016-10-10 ENCOUNTER — Telehealth: Payer: Self-pay | Admitting: Nutrition

## 2016-10-10 NOTE — Telephone Encounter (Signed)
Paperwork was faxed to [facility]. On 10/03/16.  I tried calling [employer], but was on hold for 30 min.  Paperwork was refaxed to [employer], with confirmation that fax was sent.Marland Kitchen

## 2016-10-10 NOTE — Telephone Encounter (Signed)
Brandy with [employer] is here to assist Pe[place] with the sign up of Ms. Hehr to get the Omnipod, Theadora Rama is asking if Vaughan Basta has faxed the medicare paperwork and if the response has been received

## 2016-10-14 ENCOUNTER — Other Ambulatory Visit: Payer: Self-pay | Admitting: Internal Medicine

## 2016-10-17 ENCOUNTER — Encounter: Payer: Self-pay | Admitting: Cardiovascular Disease

## 2016-10-17 ENCOUNTER — Ambulatory Visit (INDEPENDENT_AMBULATORY_CARE_PROVIDER_SITE_OTHER): Payer: Medicare Other | Admitting: Cardiovascular Disease

## 2016-10-17 VITALS — BP 150/60 | HR 60 | Ht 60.0 in | Wt 141.0 lb

## 2016-10-17 DIAGNOSIS — I482 Chronic atrial fibrillation, unspecified: Secondary | ICD-10-CM

## 2016-10-17 NOTE — Patient Instructions (Signed)

## 2016-10-23 DIAGNOSIS — I5033 Acute on chronic diastolic (congestive) heart failure: Secondary | ICD-10-CM | POA: Diagnosis not present

## 2016-10-23 DIAGNOSIS — I4891 Unspecified atrial fibrillation: Secondary | ICD-10-CM | POA: Diagnosis not present

## 2016-10-23 DIAGNOSIS — E1151 Type 2 diabetes mellitus with diabetic peripheral angiopathy without gangrene: Secondary | ICD-10-CM | POA: Diagnosis not present

## 2016-10-23 DIAGNOSIS — Z803 Family history of malignant neoplasm of breast: Secondary | ICD-10-CM | POA: Diagnosis not present

## 2016-10-23 DIAGNOSIS — E1121 Type 2 diabetes mellitus with diabetic nephropathy: Secondary | ICD-10-CM | POA: Diagnosis not present

## 2016-10-23 DIAGNOSIS — I130 Hypertensive heart and chronic kidney disease with heart failure and stage 1 through stage 4 chronic kidney disease, or unspecified chronic kidney disease: Secondary | ICD-10-CM | POA: Diagnosis not present

## 2016-10-23 DIAGNOSIS — Z87891 Personal history of nicotine dependence: Secondary | ICD-10-CM | POA: Diagnosis not present

## 2016-10-23 DIAGNOSIS — Z7982 Long term (current) use of aspirin: Secondary | ICD-10-CM | POA: Diagnosis not present

## 2016-10-23 DIAGNOSIS — Z9641 Presence of insulin pump (external) (internal): Secondary | ICD-10-CM | POA: Diagnosis not present

## 2016-10-23 DIAGNOSIS — I2510 Atherosclerotic heart disease of native coronary artery without angina pectoris: Secondary | ICD-10-CM | POA: Diagnosis not present

## 2016-10-23 DIAGNOSIS — N183 Chronic kidney disease, stage 3 (moderate): Secondary | ICD-10-CM | POA: Diagnosis not present

## 2016-10-23 DIAGNOSIS — E1122 Type 2 diabetes mellitus with diabetic chronic kidney disease: Secondary | ICD-10-CM | POA: Diagnosis not present

## 2016-10-23 DIAGNOSIS — Z794 Long term (current) use of insulin: Secondary | ICD-10-CM | POA: Diagnosis not present

## 2016-10-23 DIAGNOSIS — Z85828 Personal history of other malignant neoplasm of skin: Secondary | ICD-10-CM | POA: Diagnosis not present

## 2016-10-23 DIAGNOSIS — Z1231 Encounter for screening mammogram for malignant neoplasm of breast: Secondary | ICD-10-CM | POA: Diagnosis not present

## 2016-10-23 LAB — HM MAMMOGRAPHY

## 2016-11-07 ENCOUNTER — Other Ambulatory Visit (HOSPITAL_COMMUNITY): Payer: Self-pay | Admitting: Cardiology

## 2016-11-07 MED ORDER — TORSEMIDE 20 MG PO TABS: 40.0000 mg | ORAL_TABLET | Freq: Two times a day (BID) | ORAL | 3 refills | Status: DC

## 2016-11-07 NOTE — Telephone Encounter (Signed)
Dr Johnsie Cancel has never filled this for the patient. Okay to refill? Please advise. Thanks, MI

## 2016-11-07 NOTE — Telephone Encounter (Signed)
Pt seen at [facility]

## 2016-11-08 DIAGNOSIS — E119 Type 2 diabetes mellitus without complications: Secondary | ICD-10-CM | POA: Diagnosis not present

## 2016-11-12 ENCOUNTER — Other Ambulatory Visit: Payer: Self-pay | Admitting: Internal Medicine

## 2016-11-12 NOTE — Telephone Encounter (Signed)
Medication Detail    Disp Refills Start End   torsemide (DEMADEX) 20 MG tablet 120 tablet 3 11/07/2016    Sig - Route: Take 2 tablets (40 mg total) by mouth 2 (two) times daily. - Oral   Notes to Pharmacy: Please cancel all previous orders for current medication. Change in dosage or pill size.   E-Prescribing Status: Receipt confirmed by pharmacy (11/07/2016 4:34 PM EDT)   Pharmacy   [id number] - [place], [place]. [address]

## 2016-11-14 ENCOUNTER — Other Ambulatory Visit (HOSPITAL_COMMUNITY): Payer: Self-pay | Admitting: Cardiology

## 2016-11-14 MED ORDER — TORSEMIDE 20 MG PO TABS: 40.0000 mg | ORAL_TABLET | Freq: Two times a day (BID) | ORAL | 3 refills | Status: DC

## 2016-11-15 ENCOUNTER — Encounter: Payer: Self-pay | Admitting: Internal Medicine

## 2016-11-15 NOTE — Progress Notes (Signed)
Result Abstracted 

## 2016-11-21 ENCOUNTER — Other Ambulatory Visit: Payer: Self-pay | Admitting: Internal Medicine

## 2016-12-08 ENCOUNTER — Other Ambulatory Visit (HOSPITAL_COMMUNITY): Payer: Self-pay | Admitting: Internal Medicine

## 2016-12-21 ENCOUNTER — Telehealth: Payer: Self-pay | Admitting: Endocrinology

## 2016-12-21 NOTE — Telephone Encounter (Signed)
[facility] called they need a copy of patient labs,   C peptide  Labs with fasting glucose.  [phone] ex [phone]

## 2016-12-24 NOTE — Telephone Encounter (Signed)
Please direct request to med records

## 2016-12-25 NOTE — Telephone Encounter (Signed)
This has been faxed to med records

## 2017-01-01 ENCOUNTER — Ambulatory Visit (INDEPENDENT_AMBULATORY_CARE_PROVIDER_SITE_OTHER): Payer: Medicare Other | Admitting: Cardiovascular Disease

## 2017-01-01 VITALS — BP 137/65 | HR 65 | Ht 60.0 in | Wt 140.6 lb

## 2017-01-01 DIAGNOSIS — I739 Peripheral vascular disease, unspecified: Secondary | ICD-10-CM | POA: Diagnosis not present

## 2017-01-01 DIAGNOSIS — I5032 Chronic diastolic (congestive) heart failure: Secondary | ICD-10-CM | POA: Diagnosis not present

## 2017-01-01 DIAGNOSIS — I482 Chronic atrial fibrillation, unspecified: Secondary | ICD-10-CM

## 2017-01-01 DIAGNOSIS — I779 Disorder of arteries and arterioles, unspecified: Secondary | ICD-10-CM

## 2017-01-01 LAB — BASIC METABOLIC PANEL
BUN: 62 mg/dL — ABNORMAL HIGH (ref 7–25)
CO2: 29 mmol/L (ref 20–31)
CREATININE: 1.66 mg/dL — AB (ref 0.60–0.88)
Calcium: 10.4 mg/dL (ref 8.6–10.4)
Chloride: 100 mmol/L (ref 98–110)
Glucose, Bld: 225 mg/dL — ABNORMAL HIGH (ref 65–99)
Potassium: 5 mmol/L (ref 3.5–5.3)
Sodium: 139 mmol/L (ref 135–146)

## 2017-01-01 NOTE — Progress Notes (Signed)
Cardiology Office Note   Date:  01/01/2017   ID:  Emma Yu, DOB 09-16-1926, MRN [id number]  PCP:  Janith Lima, MD  Cardiologist:   Dr. Johnsie Cancel  Chief Complaint  Patient presents with  . Follow-up    pt denied chest pain, pt c/o SOB on exertion      History of Present Illness: Emma Yu is a 83 y.o. female who presents for a follow up visit regarding peripheral arterial disease . She has a history of chronic Atrial fibrillation not on anticoagulation due to recurrent falls, chronic diastolic heart failure, peripheral arterial disease, status post bilateral common iliac artery stenting in 2002  with known significant right SFA disease, diabetes, hypertension, hyperlipidemia. LHC 01/10/11: Luminal irregularities.  She walks very slowly with a walker.  She has known history of chronic venous insufficiency with significant stasis dermatitis. Most recent echo in 05/2016 showed normal EF, mild AI and calcified aortic valve without significant stenosis.   She had problems with fluid overload over the last 6 months which gradually improved and has been stable on current dose of torsemide 40 mg twice daily. Her mobility is limited and she denies claudication. She has no ulceration on the lower extremities. No chest pain. Shortness of breath improved significantly with diuresis.    Past Medical History:  Diagnosis Date  . Acute respiratory failure ([place])    12/2011 admission - decreased O2 sats on ambulation, improved by time of discharge  . Aortic stenosis    Moderate by echo 4/13 - mean 20 mmHg  . Arthritis    "back" (08/24/2015)  . Atrial fibrillation ([place])    not a coumadin candidate secondary to fall risk  . CAD (coronary artery disease)    minimal plaque by cath 5/12  . CHF (congestive heart failure) (HCC)    EF 55-60%  . Chronic atrial fibrillation with RVR (HCC)   . Chronic diastolic heart failure (HCC)    Echocardiogram 12/10/11: Moderate LVH, EF 65-70%,  moderate aortic stenosis, mean gradient 20, mild MS  . Chronic mid back pain   . DJD (degenerative joint disease) of hip    s/p R THR 10/2010  . DVT (deep venous thrombosis) ([place]) ~ 2012   LLE  . History of blood transfusion 1957   "related to childbirth"  . Hyperlipidemia   . Hypertension   . Insulin pump in place   . Iron deficiency anemia   . Kidney stones ~ 1958   "no OR"  . Migraine    "none since my hysterectomy" (08/24/2015)  . Mitral stenosis    Mild by echo 4/13  . Myocardial infarction 12/2010  . PAD (peripheral artery disease) (HCC)    s/p bilateral comon iliac artery stenting in 2002. Known significant  R SFA  disease. carotid dz noted 11/2011 followed by Dr. Bridgett Larsson  . Skin cancer    right forehead/head  . Type II diabetes mellitus ([place]) dx'd 1999  . Venous insufficiency     Past Surgical History:  Procedure Laterality Date  . ABDOMINAL HYSTERECTOMY    . APPENDECTOMY    . BACK SURGERY    . CARDIAC CATHETERIZATION  01/10/2011   No significant CAD  . CATARACT EXTRACTION, BILATERAL Bilateral 2015  . CHOLECYSTECTOMY OPEN    . ILIAC ARTERY STENT Bilateral 2002   [place]  . JOINT REPLACEMENT    . [place] SURGERY  1999  . REVISION TOTAL HIP ARTHROPLASTY  1994  . SKIN CANCER  EXCISION  2016   top of right forehead/head  . TONSILLECTOMY    . TOTAL HIP ARTHROPLASTY Right 1991     Current Outpatient Prescriptions  Medication Sig Dispense Refill  . acetaminophen (TYLENOL) 500 MG tablet Take 500-1,000 mg by mouth every 6 (six) hours as needed (for pain).     Marland Kitchen amLODipine (NORVASC) 5 MG tablet Take 5 mg by mouth daily.    Marland Kitchen aspirin EC 81 MG tablet Take 81 mg by mouth 2 (two) times daily.    Marland Kitchen b complex vitamins tablet Take 1 tablet by mouth daily.      . calcium-vitamin D (OSCAL WITH D) 500-200 MG-UNIT tablet Take 1 tablet by mouth 2 (two) times daily.    . Cholecalciferol (VITAMIN D3) 2000 UNITS capsule Take 2,000 Units by mouth daily.      . Continuous Blood  Gluc Sensor (FREESTYLE LIBRE SENSOR SYSTEM) MISC 1 Device by Does not apply route as directed. 1 device every 10 days 10 each 3  . gabapentin (NEURONTIN) 300 MG capsule Take 600 mg by mouth daily.    Marland Kitchen guaifenesin (GERI-TUSSIN) 100 MG/5ML syrup Take 5 mLs by mouth 4 (four) times daily as needed for cough.    . insulin lispro (HUMALOG) 100 UNIT/ML injection Inject 11 Units into the skin 3 (three) times daily before meals.     . metoprolol (LOPRESSOR) 100 MG tablet TAKE 1 TABLET (100 MG TOTAL) BY MOUTH 2 (TWO) TIMES DAILY. 60 tablet 11  . omeprazole (PRILOSEC) 40 MG capsule TAKE 1 CAPSULE BY MOUTH  DAILY BEFORE BREAKFAST 90 capsule 2  . potassium chloride SA (K-DUR,KLOR-CON) 20 MEQ tablet Take 2 tablets (40 mEq total) by mouth daily. 180 tablet 3  . pravastatin (PRAVACHOL) 20 MG tablet Take 20 mg by mouth daily.    . Probiotic Product (PROBIOTIC PO) Take 1 tablet by mouth daily at 10 pm.    . torsemide (DEMADEX) 20 MG tablet Take 2 tablets (40 mg total) by mouth 2 (two) times daily. 360 tablet 3  . traMADol (ULTRAM) 50 MG tablet Take 1 tablet (50 mg total) by mouth every 12 (twelve) hours as needed (for pain). 15 tablet 0   No current facility-administered medications for this visit.     Allergies:   Carvedilol; Augmentin [amoxicillin-pot clavulanate]; Clindamycin/lincomycin; Diltiazem hcl; Fenofibrate; Hctz [hydrochlorothiazide]; Nisoldipine; Nitroglycerin; Propranolol; Tekturna [aliskiren fumarate]; Valturna [aliskiren-valsartan]; Septra [sulfamethoxazole-trimethoprim]; and Sulfa antibiotics    Social History:  The patient  reports that she quit smoking about 31 years ago. Her smoking use included Cigarettes. She has a 74.00 pack-year smoking history. She has never used smokeless tobacco. She reports that she does not drink alcohol or use drugs.   Family History:  The patient's family history includes Diabetes in her father and other.    ROS:  Please see the history of present illness.    Otherwise, review of systems are positive for none.   All other systems are reviewed and negative.    PHYSICAL EXAM: VS:  BP 137/65   Pulse 65   Ht 5' (1.524 m)   Wt 140 lb 9.6 oz (63.8 kg)   BMI 27.46 kg/m  , BMI Body mass index is 27.46 kg/m. GEN: Well nourished, well developed, in no acute distress  HEENT: normal  Neck: no JVD, carotid bruits, or masses Cardiac: irregularly irregular; no  rubs, or gallops,no edema . 2/6 SEM in aortic area.  Respiratory:  clear to auscultation bilaterally, normal work of breathing GI: soft, nontender,  nondistended, + BS MS: no deformity or atrophy  Skin: warm and dry,  Chronic stasis dermatitis.  Neuro:  Strength and sensation are intact Psych: euthymic mood, full affect   EKG:  EKG is not ordered today.    Recent Labs: 07/06/2016: B Natriuretic Peptide 230.2 08/28/2016: BUN 53; Creatinine, Ser 1.37; Hemoglobin 13.2; Platelets 182.0; Potassium 4.3; Sodium 137; TSH 1.79    Lipid Panel    Component Value Date/Time   CHOL 134 08/28/2016 1433   TRIG 171.0 (H) 08/28/2016 1433   HDL 46.00 08/28/2016 1433   CHOLHDL 3 08/28/2016 1433   VLDL 34.2 08/28/2016 1433   LDLCALC 54 08/28/2016 1433      Wt Readings from Last 3 Encounters:  01/01/17 140 lb 9.6 oz (63.8 kg)  10/17/16 141 lb (64 kg)  09/12/16 142 lb (64.4 kg)         ASSESSMENT AND PLAN:  1.   Chronic diastolic heart failure:  She appears to be euvolemic on current dose of torsemide 40 mg twice daily. Check basic metabolic profile.  2. Chronic atrial fibrillation: Ventricular rate is controlled on metoprolol. She has been deemed to be not a candidate for anticoagulation.  3. Peripheral arterial disease: The patient denies any claudication likely due to poor functional capacity overall. She has no evidence of critical limb ischemia.  4. Essential hypertension: Blood pressure is controlled on current medications.  5. Moderate bilateral carotid stenosis: Given her age and  comorbidities, I don't think she would be a good candidate for endarterectomy if her disease is asymptomatic.   Disposition:  continue to follow-up with Dr. Johnsie Cancel as planned. Follow up with me as needed for worsening PAD  Signed,  Kathlyn Sacramento, MD  01/01/2017 11:05 AM    [place]

## 2017-01-01 NOTE — Patient Instructions (Signed)
Medication Instructions:  Your physician recommends that you continue on your current medications as directed. Please refer to the Current Medication list given to you today.  Labwork: Your physician recommends that you have lab work today: BMP  Testing/Procedures: No new orders.   Follow-Up: Your physician recommends that you schedule a follow-up appointment as needed with Dr Fletcher [name].    Any Other Special Instructions Will Be Listed Below (If Applicable).     If you need a refill on your cardiac medications before your next appointment, please call your pharmacy.

## 2017-01-03 ENCOUNTER — Other Ambulatory Visit: Payer: Self-pay

## 2017-01-03 MED ORDER — TORSEMIDE 20 MG PO TABS: 20.0000 mg | ORAL_TABLET | Freq: Two times a day (BID) | ORAL | 3 refills | Status: AC

## 2017-01-08 ENCOUNTER — Other Ambulatory Visit: Payer: Self-pay

## 2017-01-08 DIAGNOSIS — E1051 Type 1 diabetes mellitus with diabetic peripheral angiopathy without gangrene: Secondary | ICD-10-CM

## 2017-01-10 ENCOUNTER — Encounter: Payer: Self-pay | Admitting: Endocrinology

## 2017-01-10 ENCOUNTER — Other Ambulatory Visit: Payer: Medicare Other

## 2017-01-10 ENCOUNTER — Ambulatory Visit (INDEPENDENT_AMBULATORY_CARE_PROVIDER_SITE_OTHER): Payer: Medicare Other | Admitting: Endocrinology

## 2017-01-10 VITALS — BP 118/58 | HR 72 | Ht 60.0 in | Wt 140.0 lb

## 2017-01-10 DIAGNOSIS — E1051 Type 1 diabetes mellitus with diabetic peripheral angiopathy without gangrene: Secondary | ICD-10-CM | POA: Diagnosis not present

## 2017-01-10 LAB — POCT GLYCOSYLATED HEMOGLOBIN (HGB A1C): HEMOGLOBIN A1C: 7.2

## 2017-01-10 MED ORDER — GLUCOSE BLOOD VI STRP: 1.0000 | ORAL_STRIP | Freq: Four times a day (QID) | 3 refills | Status: AC

## 2017-01-10 NOTE — Patient Instructions (Addendum)
Please take these settings:  basal rate of 0.9 unit/hr, 24 hrs per day.   bolus of 11 units with each meal.  correction bolus (which some people call "sensitivity," or "insulin sensitivity ratio," or just "isr") of 1 unit for each by 25 which your glucose exceeds 120.  check your blood sugar 4 times a day--before the 3 meals, and at bedtime.  also check if you have symptoms of your blood sugar being too high or too low.  please keep a record of the readings and bring it to your next appointment here.  please call us sooner if you are having low blood sugar episodes.  Please make a follow-up appointment in 4 months.  I'll let Vaughan Basta know when we get the results of today's blood test.

## 2017-01-10 NOTE — Progress Notes (Signed)
Subjective:    Patient ID: Emma Yu, female    DOB: 01/11/1927, 83 y.o.   MRN: [id number]  HPI Pt returns for f/u of diabetes mellitus: DM type: 1 Dx'ed: 4081 Complications: CAD, nephropathy, and PAD.   Therapy: insulin since 2001 GDM: never DKA: never Severe hypoglycemia: never Pancreatitis: never Other: she has a one-touch ping insulin pump; she has declined continuous glucose monitor; son Nicole Kindred operates pump (not here for ov's).  Interval history: She averages a total of approx 60 units of humalog per day, via her pump.  She uses these settings (were increased 1 month ago): basal rate of 1.0 unit/hr, 24 hrs per day.   bolus of 11 units with each meal.   correction bolus (which some people call "sensitivity," or "insulin sensitivity ratio," or just "isr") of 1 unit for each by 25 which your glucose exceeds 120.  She sometimes takes less then the prescribed bolus, due to fear pf hypoglycemia.  However, she says she seldom has hypoglycemia.  no cbg record, but states cbg's are in general higher as the day goes on.  She needs a new pump, and a continuous glucose monitor.  Labs for this were done today Past Medical History:  Diagnosis Date  . Acute respiratory failure ([place])    12/2011 admission - decreased O2 sats on ambulation, improved by time of discharge  . Aortic stenosis    Moderate by echo 4/13 - mean 20 mmHg  . Arthritis    "back" (08/24/2015)  . Atrial fibrillation ([place])    not a coumadin candidate secondary to fall risk  . CAD (coronary artery disease)    minimal plaque by cath 5/12  . CHF (congestive heart failure) (HCC)    EF 55-60%  . Chronic atrial fibrillation with RVR (HCC)   . Chronic diastolic heart failure (HCC)    Echocardiogram 12/10/11: Moderate LVH, EF 65-70%, moderate aortic stenosis, mean gradient 20, mild MS  . Chronic mid back pain   . DJD (degenerative joint disease) of hip    s/p R THR 10/2010  . DVT (deep venous thrombosis) ([place]) ~ 2012   LLE  . History of blood transfusion 1957   "related to childbirth"  . Hyperlipidemia   . Hypertension   . Insulin pump in place   . Iron deficiency anemia   . Kidney stones ~ 1958   "no OR"  . Migraine    "none since my hysterectomy" (08/24/2015)  . Mitral stenosis    Mild by echo 4/13  . Myocardial infarction ([place]) 12/2010  . PAD (peripheral artery disease) (HCC)    s/p bilateral comon iliac artery stenting in 2002. Known significant  R SFA  disease. carotid dz noted 11/2011 followed by Dr. Bridgett Larsson  . Skin cancer    right forehead/head  . Type II diabetes mellitus ([place]) dx'd 1999  . Venous insufficiency     Past Surgical History:  Procedure Laterality Date  . ABDOMINAL HYSTERECTOMY    . APPENDECTOMY    . BACK SURGERY    . CARDIAC CATHETERIZATION  01/10/2011   No significant CAD  . CATARACT EXTRACTION, BILATERAL Bilateral 2015  . CHOLECYSTECTOMY OPEN    . ILIAC ARTERY STENT Bilateral 2002   [place]  . JOINT REPLACEMENT    . [place] SURGERY  1999  . REVISION TOTAL HIP ARTHROPLASTY  1994  . SKIN CANCER EXCISION  2016   top of right forehead/head  . TONSILLECTOMY    . TOTAL HIP  ARTHROPLASTY Right 1991    Social History   Social History  . Marital status: Widowed    Spouse name: N/A  . Number of children: N/A  . Years of education: N/A   Occupational History  . Retired         Social History Main Topics  . Smoking status: Former Smoker    Packs/day: 2.00    Years: 37.00    Types: Cigarettes    Quit date: 08/20/1985  . Smokeless tobacco: Never Used  . Alcohol use No  . Drug use: No  . Sexual activity: No   Other Topics Concern  . Not on file   Social History Narrative   Lives with son in [place], [place] smoking in 1987    Current Outpatient Prescriptions on File Prior to Visit  Medication Sig Dispense Refill  . acetaminophen (TYLENOL) 500 MG tablet Take 500-1,000 mg by mouth every 6 (six) hours as needed (for pain).     Marland Kitchen  amLODipine (NORVASC) 5 MG tablet Take 5 mg by mouth daily.    Marland Kitchen aspirin EC 81 MG tablet Take 81 mg by mouth 2 (two) times daily.    Marland Kitchen b complex vitamins tablet Take 1 tablet by mouth daily.      . calcium-vitamin D (OSCAL WITH D) 500-200 MG-UNIT tablet Take 1 tablet by mouth 2 (two) times daily.    . Cholecalciferol (VITAMIN D3) 2000 UNITS capsule Take 2,000 Units by mouth daily.      Marland Kitchen gabapentin (NEURONTIN) 300 MG capsule Take 600 mg by mouth daily.    . insulin lispro (HUMALOG) 100 UNIT/ML injection Inject 11 Units into the skin 3 (three) times daily before meals.     . metoprolol (LOPRESSOR) 100 MG tablet TAKE 1 TABLET (100 MG TOTAL) BY MOUTH 2 (TWO) TIMES DAILY. 60 tablet 11  . omeprazole (PRILOSEC) 40 MG capsule TAKE 1 CAPSULE BY MOUTH  DAILY BEFORE BREAKFAST 90 capsule 2  . potassium chloride SA (K-DUR,KLOR-CON) 20 MEQ tablet Take 2 tablets (40 mEq total) by mouth daily. 180 tablet 3  . pravastatin (PRAVACHOL) 20 MG tablet Take 20 mg by mouth daily.    . Probiotic Product (PROBIOTIC PO) Take 1 tablet by mouth daily at 10 pm.    . torsemide (DEMADEX) 20 MG tablet Take 1 tablet (20 mg total) by mouth 2 (two) times daily. 180 tablet 3  . traMADol (ULTRAM) 50 MG tablet Take 1 tablet (50 mg total) by mouth every 12 (twelve) hours as needed (for pain). 15 tablet 0  . Continuous Blood Gluc Sensor (FREESTYLE LIBRE SENSOR SYSTEM) MISC 1 Device by Does not apply route as directed. 1 device every 10 days (Patient not taking: Reported on 01/10/2017) 10 each 3  . guaifenesin (GERI-TUSSIN) 100 MG/5ML syrup Take 5 mLs by mouth 4 (four) times daily as needed for cough.     No current facility-administered medications on file prior to visit.     Allergies  Allergen Reactions  . Carvedilol Other (See Comments)    Heart stops CARDIAC ARREST  . Augmentin [Amoxicillin-Pot Clavulanate] Diarrhea  . Clindamycin/Lincomycin Other (See Comments)    tremors  . Diltiazem Hcl Other (See Comments)    Chest pain;    07/02/14-  Patient has been able to tolerate diltiazem PO as inpatient since 06/29/14 without any adverse reaction.  . Fenofibrate Other (See Comments)    Unknown - [facility] - [facility]  . Hctz [Hydrochlorothiazide] Other (See  Comments)    Chest pain  . Nisoldipine Other (See Comments)    Chest pain  . Nitroglycerin Other (See Comments)    Unknown - [facility]  . Propranolol Diarrhea    Chest pain  . Tekturna [Aliskiren Fumarate] Other (See Comments)    Unknown reaction  . Valturna [Aliskiren-Valsartan] Other (See Comments)    Unknown reaction  . Septra [Sulfamethoxazole-Trimethoprim] Rash    Rash, cyst  . Sulfa Antibiotics Rash    Family History  Problem Relation Age of Onset  . Diabetes Father   . Diabetes Other        5/8 sibs    BP (!) 118/58   Pulse 72   Ht 5' (1.524 m)   Wt 140 lb (63.5 kg)   SpO2 (!) 88%   BMI 27.34 kg/m    Review of Systems She denies LOC    Objective:   Physical Exam VITAL SIGNS:  See vs page GENERAL: no distress CV: trace bilat leg edema, and bilat vv's.   Pulses: dorsalis pedis are absent bilaterally.   SKIN: There is mild (chronic) erythema of the legs, but no ulcer. There is rusty discoloration on the legs and feet.  Ext: no deformity.  There is severe bilateral onychomycosis.  Neuro: sensation is intact to touch on the feet.    Lab Results  Component Value Date   HGBA1C 7.2 01/10/2017      Assessment & Plan:  Type 1 DM: overcontrolled Frail elderly state: she is not a candidate for aggressive glycemic control. Noncompliance with pump settings: we discussed risks of taking too much insulins.   Patient Instructions  Please take these settings:  basal rate of 0.9 unit/hr, 24 hrs per day.   bolus of 11 units with each meal.  correction bolus (which some people call "sensitivity," or "insulin sensitivity ratio," or just "isr") of 1 unit for each by 25 which your glucose exceeds 120.  check your blood sugar 4 times a day--before the 3  meals, and at bedtime.  also check if you have symptoms of your blood sugar being too high or too low.  please keep a record of the readings and bring it to your next appointment here.  please call us sooner if you are having low blood sugar episodes.  Please make a follow-up appointment in 4 months.  I'll let Vaughan Basta know when we get the results of today's blood test.

## 2017-01-11 LAB — GLUCOSE, FASTING: GLUCOSE, FASTING: 135 mg/dL — AB (ref 65–99)

## 2017-01-11 LAB — C-PEPTIDE: C PEPTIDE: 1.74 ng/mL (ref 0.80–3.85)

## 2017-01-21 DIAGNOSIS — E119 Type 2 diabetes mellitus without complications: Secondary | ICD-10-CM | POA: Diagnosis not present

## 2017-01-24 ENCOUNTER — Telehealth: Payer: Self-pay | Admitting: Internal Medicine

## 2017-01-24 NOTE — Telephone Encounter (Signed)
Faxed over c peptide results attn kara to [facility].  Thanks!

## 2017-01-24 NOTE — Telephone Encounter (Signed)
Marcene Brawn calling for C-peptide lab results. Her  ext [phone] fax [phone] [place],  -[place]

## 2017-01-30 DIAGNOSIS — E1065 Type 1 diabetes mellitus with hyperglycemia: Secondary | ICD-10-CM | POA: Diagnosis not present

## 2017-02-04 DIAGNOSIS — E119 Type 2 diabetes mellitus without complications: Secondary | ICD-10-CM | POA: Diagnosis not present

## 2017-02-21 ENCOUNTER — Other Ambulatory Visit: Payer: Self-pay | Admitting: Internal Medicine

## 2017-02-25 ENCOUNTER — Telehealth: Payer: Self-pay | Admitting: Cardiovascular Disease

## 2017-02-25 NOTE — Telephone Encounter (Signed)
Patient has an appointment with Dr. Johnsie Cancel on 8/22 at 11:00 am. Patient verbalized understanding.

## 2017-02-25 NOTE — Telephone Encounter (Signed)
New Message  Pt was call to reschedule appt with only Dr. Johnsie Cancel for sooner than next avail. Please call back to discuss

## 2017-02-26 ENCOUNTER — Telehealth: Payer: Self-pay | Admitting: Nutrition

## 2017-02-26 ENCOUNTER — Encounter: Payer: Self-pay | Admitting: Internal Medicine

## 2017-02-26 ENCOUNTER — Other Ambulatory Visit (INDEPENDENT_AMBULATORY_CARE_PROVIDER_SITE_OTHER): Payer: Medicare Other

## 2017-02-26 ENCOUNTER — Ambulatory Visit (INDEPENDENT_AMBULATORY_CARE_PROVIDER_SITE_OTHER): Payer: Medicare Other | Admitting: Internal Medicine

## 2017-02-26 VITALS — BP 120/60 | HR 68 | Temp 97.9°F | Resp 16 | Ht 60.0 in | Wt 144.5 lb

## 2017-02-26 DIAGNOSIS — E1051 Type 1 diabetes mellitus with diabetic peripheral angiopathy without gangrene: Secondary | ICD-10-CM | POA: Diagnosis not present

## 2017-02-26 DIAGNOSIS — I10 Essential (primary) hypertension: Secondary | ICD-10-CM

## 2017-02-26 DIAGNOSIS — J301 Allergic rhinitis due to pollen: Secondary | ICD-10-CM | POA: Diagnosis not present

## 2017-02-26 DIAGNOSIS — N183 Chronic kidney disease, stage 3 unspecified: Secondary | ICD-10-CM

## 2017-02-26 LAB — BASIC METABOLIC PANEL
BUN: 59 mg/dL — AB (ref 6–23)
CHLORIDE: 102 meq/L (ref 96–112)
CO2: 29 mEq/L (ref 19–32)
Calcium: 10.5 mg/dL (ref 8.4–10.5)
Creatinine, Ser: 1.53 mg/dL — ABNORMAL HIGH (ref 0.40–1.20)
GFR: 34.07 mL/min — AB (ref >60.00)
Glucose, Bld: 160 mg/dL — ABNORMAL HIGH (ref 70–99)
POTASSIUM: 4.9 meq/L (ref 3.5–5.1)
SODIUM: 138 meq/L (ref 135–145)

## 2017-02-26 LAB — MICROALBUMIN / CREATININE URINE RATIO
CREATININE, U: 49.2 mg/dL
MICROALB UR: 4.7 mg/dL — AB (ref 0.0–1.9)
MICROALB/CREAT RATIO: 9.5 mg/g (ref 0.0–30.0)

## 2017-02-26 MED ORDER — LEVOCETIRIZINE DIHYDROCHLORIDE 5 MG PO TABS: 5.0000 mg | ORAL_TABLET | Freq: Every evening | ORAL | 3 refills | Status: AC

## 2017-02-26 NOTE — Patient Instructions (Signed)

## 2017-02-26 NOTE — Progress Notes (Signed)
Subjective:  Patient ID: Emma Yu, female    DOB: 29-Sep-1926  Age: 83 y.o. MRN: [id number]  CC: Hypertension; Diabetes; and Allergic Rhinitis    HPI Emma Yu presents for f/up - she complains of runny nose and PND. She has minimal DOE with exertion but no edema, CP, SOB, or fatigue.  Outpatient Medications Prior to Visit  Medication Sig Dispense Refill  . acetaminophen (TYLENOL) 500 MG tablet Take 500-1,000 mg by mouth every 6 (six) hours as needed (for pain).     Marland Kitchen amLODipine (NORVASC) 5 MG tablet Take 5 mg by mouth daily.    Marland Kitchen aspirin EC 81 MG tablet Take 81 mg by mouth 2 (two) times daily.    Marland Kitchen b complex vitamins tablet Take 1 tablet by mouth daily.      . calcium-vitamin D (OSCAL WITH D) 500-200 MG-UNIT tablet Take 1 tablet by mouth 2 (two) times daily.    . Cholecalciferol (VITAMIN D3) 2000 UNITS capsule Take 2,000 Units by mouth daily.      . Continuous Blood Gluc Sensor (FREESTYLE LIBRE SENSOR SYSTEM) MISC 1 Device by Does not apply route as directed. 1 device every 10 days 10 each 3  . gabapentin (NEURONTIN) 300 MG capsule Take 600 mg by mouth daily.    Marland Kitchen gabapentin (NEURONTIN) 300 MG capsule TAKE 2 CAPSULES BY MOUTH AT BEDTIME 60 capsule 4  . glucose blood (ONE TOUCH ULTRA TEST) test strip 1 each by Other route 4 (four) times daily. And lancets 4/day 360 each 3  . guaifenesin (GERI-TUSSIN) 100 MG/5ML syrup Take 5 mLs by mouth 4 (four) times daily as needed for cough.    . insulin lispro (HUMALOG) 100 UNIT/ML injection Inject 11 Units into the skin 3 (three) times daily before meals.     . metoprolol (LOPRESSOR) 100 MG tablet TAKE 1 TABLET (100 MG TOTAL) BY MOUTH 2 (TWO) TIMES DAILY. 60 tablet 11  . omeprazole (PRILOSEC) 40 MG capsule TAKE 1 CAPSULE BY MOUTH  DAILY BEFORE BREAKFAST 90 capsule 2  . potassium chloride SA (K-DUR,KLOR-CON) 20 MEQ tablet Take 2 tablets (40 mEq total) by mouth daily. 180 tablet 3  . pravastatin (PRAVACHOL) 20 MG tablet Take 20 mg by  mouth daily.    . Probiotic Product (PROBIOTIC PO) Take 1 tablet by mouth daily at 10 pm.    . torsemide (DEMADEX) 20 MG tablet Take 1 tablet (20 mg total) by mouth 2 (two) times daily. 180 tablet 3  . traMADol (ULTRAM) 50 MG tablet Take 1 tablet (50 mg total) by mouth every 12 (twelve) hours as needed (for pain). 15 tablet 0   No facility-administered medications prior to visit.     ROS Review of Systems  Constitutional: Negative for appetite change, chills, diaphoresis, fatigue and fever.  HENT: Positive for postnasal drip and rhinorrhea. Negative for congestion, facial swelling, nosebleeds, sinus pain, sinus pressure, sneezing, sore throat and trouble swallowing.   Eyes: Negative.   Respiratory: Positive for shortness of breath (DOE). Negative for cough, chest tightness, wheezing and stridor.   Cardiovascular: Negative.  Negative for chest pain, palpitations and leg swelling.  Gastrointestinal: Negative for abdominal pain, constipation, diarrhea, nausea and vomiting.  Endocrine: Negative for polydipsia, polyphagia and polyuria.  Genitourinary: Negative.  Negative for decreased urine volume, difficulty urinating and urgency.  Musculoskeletal: Negative.   Skin: Negative.   Allergic/Immunologic: Negative.   Neurological: Negative.  Negative for dizziness, weakness and headaches.  Hematological: Negative for adenopathy. Does not bruise/bleed  easily.  Psychiatric/Behavioral: Negative.     Objective:  BP 120/60 (BP Location: Left Arm, Patient Position: Sitting, Cuff Size: Normal)   Pulse 68   Temp 97.9 F (36.6 C) (Oral)   Resp 16   Ht 5' (1.524 m)   Wt 144 lb 8 oz (65.5 kg)   SpO2 93%   BMI 28.22 kg/m   BP Readings from Last 3 Encounters:  02/26/17 120/60  01/10/17 (!) 118/58  01/01/17 137/65    Wt Readings from Last 3 Encounters:  02/26/17 144 lb 8 oz (65.5 kg)  01/10/17 140 lb (63.5 kg)  01/01/17 140 lb 9.6 oz (63.8 kg)    Physical Exam  Constitutional: [name]  oriented to person, place, and time. No distress.  HENT:  Mouth/Throat: Oropharynx is clear and moist. No oropharyngeal exudate.  Eyes: Conjunctivae are normal. Right eye exhibits no discharge. Left eye exhibits no discharge. No scleral icterus.  Neck: Normal range of motion. Neck supple. No JVD present. No thyromegaly present.  Cardiovascular: Normal rate and regular rhythm.  Exam reveals no gallop and no friction rub.   Murmur (2/6 SEM) heard. Pulmonary/Chest: Breath sounds normal. No respiratory distress. She has no wheezes. She has no rales. She exhibits no tenderness.  Abdominal: Soft. Bowel sounds are normal. She exhibits no distension and no mass. There is no tenderness. There is no rebound and no guarding.  Musculoskeletal: Normal range of motion. She exhibits no edema, tenderness or deformity.  Lymphadenopathy:    She has no cervical adenopathy.  Neurological: [name] alert and oriented to person, place, and time.  Skin: Skin is warm and dry. No rash noted. [name] not diaphoretic. No erythema. No pallor.  Vitals reviewed.   Lab Results  Component Value Date   WBC 8.4 08/28/2016   HGB 13.2 08/28/2016   HCT 39.5 08/28/2016   PLT 182.0 08/28/2016   GLUCOSE 160 (H) 02/26/2017   CHOL 134 08/28/2016   TRIG 171.0 (H) 08/28/2016   HDL 46.00 08/28/2016   LDLCALC 54 08/28/2016   ALT 23 06/27/2014   AST 28 06/27/2014   NA 138 02/26/2017   K 4.9 02/26/2017   CL 102 02/26/2017   CREATININE 1.53 (H) 02/26/2017   BUN 59 (H) 02/26/2017   CO2 29 02/26/2017   TSH 1.79 08/28/2016   INR 1.18 12/25/2011   HGBA1C 7.2 01/10/2017   MICROALBUR 4.7 (H) 02/26/2017    Dg Chest 2 View  Result Date: 07/06/2016 CLINICAL DATA:  Shortness of breath and cough for 1 week. History of CHF and myocardial infarction. Acute respiratory failure. History of smoking. EXAM: CHEST  2 VIEW COMPARISON:  07/04/2016 FINDINGS: The heart is enlarged. Interval development of significant bilateral airspace filling  opacities, more confluent at the bases and partially obscuring hemidiaphragms. Kerley B-lines are present. There are small bilateral pleural effusions. IMPRESSION: Interval development of airspace filling and interstitial prominence, consistent with congestive heart failure. Small effusions. Electronically Signed   By: Nolon Nations M.D.   On: 07/06/2016 15:38    Assessment & Plan:   Anwyn was seen today for hypertension, diabetes and allergic rhinitis .  Diagnoses and all orders for this visit:  Seasonal allergic rhinitis due to pollen -     levocetirizine (XYZAL) 5 MG tablet; Take 1 tablet (5 mg total) by mouth every evening.  Essential hypertension, benign- Her blood pressure is well-controlled, electrolytes are normal, renal function is stable. -     Basic metabolic panel; Future  Diabetes mellitus  type 1 with peripheral artery disease ([place])- recent A1c was 7.2%, her blood sugars are adequately well controlled. -     Basic metabolic panel; Future -     Microalbumin / creatinine urine ratio; Future  Chronic renal insufficiency, stage III (moderate)- her renal function is stable, she will continue to avoid nephrotoxic agents.   I am having Ms. Buell start on levocetirizine. I am also having her maintain her Vitamin D3, b complex vitamins, aspirin EC, Probiotic Product (PROBIOTIC PO), metoprolol tartrate, acetaminophen, traMADol, calcium-vitamin D, guaifenesin, pravastatin, amLODipine, insulin lispro, potassium chloride SA, FREESTYLE LIBRE SENSOR SYSTEM, gabapentin, omeprazole, torsemide, glucose blood, and gabapentin.  Meds ordered this encounter  Medications  . levocetirizine (XYZAL) 5 MG tablet    Sig: Take 1 tablet (5 mg total) by mouth every evening.    Dispense:  90 tablet    Refill:  3     Follow-up: Return in about 6 months (around 08/29/2017).  [name], MD

## 2017-03-04 ENCOUNTER — Encounter: Payer: Medicare Other | Admitting: Nutrition

## 2017-03-04 ENCOUNTER — Encounter: Payer: Medicare Other | Attending: Endocrinology | Admitting: Nutrition

## 2017-03-04 DIAGNOSIS — E1051 Type 1 diabetes mellitus with diabetic peripheral angiopathy without gangrene: Secondary | ICD-10-CM

## 2017-03-05 NOTE — Patient Instructions (Addendum)
Give a correction dose when blood sugars are over 200 using the bolus wizard.   Give a set bolus of 5 units when blood sugars are over 100, 5 minutes before eating.

## 2017-03-05 NOTE — Progress Notes (Signed)
This patient is here with her son to learn how to use the Medtronic pump.  Her son does the reservour and tubing changes, but she does the boluses. They were both shown how to use the pump for tubing and cartrigde changes, as well as how to give a bolus.  [name] not putting in carbs, and just giving 5u for meals, unless blood sugar is over 250.  She will then give 11u.  I have taken the information off the [place] pump and transferred the settings to her Medtronic pump. Basal rate: 0.9u/hr, with an ISF of 50, a target of 150. ( patient thinks that this reading is Low, and will not take a bolus when blood sugar is below 200.  Discussed the idea that 100 is a normal reading, and that we will put into the pump a 150 as a target for her.  We also discussed the need to take insulin when blood sugar is over 150, or readings will go into the 300s.  Her son believes this, but she does not.  He is not home all day, so has no idea what she does/or eats. Her son filled a cartridge and primed the tubing with little assistance from me, and we disconnected her old pump and attached the Medtronic pump.   I set up the bolus wizard to calculate a corrective dose, and showed her what to do when readings are over 200.  She reported good understanding of this.  I also put in a set bolus for her of 5u and she was shown how to give a bolus when her readings are 150 or more, by giving a "set bolus" that I have put in for her.   We reviewed the home screen and the status screen, so that she can see if she gave her meal time bolus.  Son says sometimes she can not remember if she has done this.  She reported good understanding of this and did re demonstrate how to do this correctly, but do not believe she will remember this.  I suggested her son review this with her again, at home tonight.  He agreed to do this.  They had no final questions I will call tomorrow, and suggested they reschedule with me in 1-2 weeks for a review of all of  this.  They did not do this.

## 2017-03-05 NOTE — Telephone Encounter (Signed)
Pt. Reported that she had no difficulty giving bolus yesterday before lunch, last night at supper and this morning.  Says FBS today was 188.  She had no questions for me at this time.

## 2017-03-24 ENCOUNTER — Other Ambulatory Visit: Payer: Self-pay | Admitting: Internal Medicine

## 2017-03-24 ENCOUNTER — Other Ambulatory Visit (HOSPITAL_COMMUNITY): Payer: Self-pay | Admitting: Internal Medicine

## 2017-03-25 NOTE — Telephone Encounter (Signed)
Followed by Johnsie Cancel

## 2017-04-08 NOTE — Progress Notes (Signed)
   Patient Name: Emma Yu Date of Encounter: 04/10/2017     Active Problems:   * No active hospital problems. *    SUBJECTIVE Emma Yu a 83 y.o. Marland Kitchenfemalewith medical history significant of diastolic CHF, diabetes, atrial fibrillation. Patient was hospitalized from 10/11//17 to 07/10/2016. Patient presented to the ER with shortness of breath. Patient states she's been having worsening swelling in her lower extremities as well as dry cough for about 1 week. Patient's weight has increased from to 154. Rx for CHF with d/c weight 139 lbs  BNP only 230 Also Rx for cellulitis Of LLE anceph and then oral doxycycline .   No dyspnea but abdomen feels swollen and legs hurt   PHYSICAL EXAM  General: Pleasant, NAD. Neuro: Alert and oriented X 3. Moves all extremities spontaneously. Psych: Normal affect. HEENT:  Normal  Neck: Supple without bruits or JVD. Lungs:  Resp regular and unlabored, CTA. Heart: regularly irregular. no s3, s4, mild AS murmur  Abdomen: Soft, non-tender, non-distended, BS + x 4.  Extremities: marked erythema to knees no warmth plus 2 chronic edema/stasis   Accessory Clinical Findings  CBC No results for input(s): WBC, NEUTROABS, HGB, HCT, MCV, PLT in the last 72 hours. Basic Metabolic Panel No results for input(s): NA, K, CL, CO2, GLUCOSE, BUN, CREATININE, CALCIUM, MG, PHOS in the last 72 hours.    ECG  07/08/16 afib rate 81 LVH poor R wave progression     Echocardiogram 05/31/16   Study Conclusions  - Left ventricle: The cavity size was normal. Wall thickness was   increased in a pattern of mild LVH. Systolic function was   vigorous. The estimated ejection fraction was in the range of 65%   to 70%. Wall motion was normal; there were no regional wall   motion abnormalities. - Aortic valve: There was mild regurgitation. - Mitral valve: Severely calcified annulus. The findings are   consistent with mild stenosis. - Left atrium: The  atrium was moderately dilated. - Right atrium: The atrium was mildly dilated. - Pericardium, extracardiac: There was a left pleural effusion.  Impressions:  - Vigorous LV systolic function; mild LVH; biatrial enlargement;   calcified aortic valve with mild AI; narrow LVOT with resting   gradient of 2 m/s; severe MAC with mild MS (mean gradient 6   mmHg); trace MR; mild TR.    Radiology/Studies  No results found.  ASSESSMENT AND PLAN  1. Acute on chronic diastolic CHF (congestive heart failure) improved wit zaroxyln monitoring Shows low chest impedence a lot of her issues seem due to chronic venous disease  2. Chronic atrial fibrillation  - not a candidate for systemic anticoagulation given falls and frailty  3. Diabetes mellitus type 1 with peripheral artery disease no limiting claudication due To poor functional status Dr Fletcher [name] is only seeing PRN   4.Aortic stenosis-moderate  5. Essential hypertension Well controlled.  Continue current medications and low sodium Dash type diet.    6. Chronic venous insufficiency no skin breakdown continue diuretics   7. CAD-minimal 2012 no angina observe    Jenkins Rouge

## 2017-04-09 ENCOUNTER — Ambulatory Visit: Payer: Medicare Other | Admitting: Cardiovascular Disease

## 2017-04-10 ENCOUNTER — Encounter: Payer: Self-pay | Admitting: Cardiovascular Disease

## 2017-04-10 ENCOUNTER — Ambulatory Visit (INDEPENDENT_AMBULATORY_CARE_PROVIDER_SITE_OTHER): Payer: Medicare Other | Admitting: Cardiovascular Disease

## 2017-04-10 VITALS — BP 122/74 | HR 66 | Ht 64.0 in | Wt 141.8 lb

## 2017-04-10 DIAGNOSIS — R609 Edema, unspecified: Secondary | ICD-10-CM

## 2017-04-10 NOTE — Patient Instructions (Signed)

## 2017-05-09 ENCOUNTER — Emergency Department (HOSPITAL_COMMUNITY): Payer: Medicare Other

## 2017-05-09 ENCOUNTER — Inpatient Hospital Stay (HOSPITAL_COMMUNITY)
Admission: EM | Admit: 2017-05-09 | Discharge: 2017-05-20 | DRG: 270 | Disposition: E | Payer: Medicare Other | Attending: Vascular Surgery | Admitting: Vascular Surgery

## 2017-05-09 ENCOUNTER — Emergency Department (HOSPITAL_COMMUNITY): Payer: Medicare Other | Admitting: Anesthesiology

## 2017-05-09 ENCOUNTER — Inpatient Hospital Stay (HOSPITAL_COMMUNITY): Payer: Medicare Other

## 2017-05-09 ENCOUNTER — Encounter (HOSPITAL_COMMUNITY): Admission: EM | Disposition: E | Payer: Self-pay | Source: Home / Self Care | Attending: Vascular Surgery

## 2017-05-09 ENCOUNTER — Encounter (HOSPITAL_COMMUNITY): Payer: Self-pay | Admitting: Emergency Medicine

## 2017-05-09 DIAGNOSIS — J9601 Acute respiratory failure with hypoxia: Secondary | ICD-10-CM | POA: Diagnosis not present

## 2017-05-09 DIAGNOSIS — M79606 Pain in leg, unspecified: Secondary | ICD-10-CM | POA: Diagnosis not present

## 2017-05-09 DIAGNOSIS — N183 Chronic kidney disease, stage 3 (moderate): Secondary | ICD-10-CM | POA: Diagnosis present

## 2017-05-09 DIAGNOSIS — I482 Chronic atrial fibrillation: Secondary | ICD-10-CM | POA: Diagnosis present

## 2017-05-09 DIAGNOSIS — R570 Cardiogenic shock: Secondary | ICD-10-CM | POA: Diagnosis not present

## 2017-05-09 DIAGNOSIS — I748 Embolism and thrombosis of other arteries: Secondary | ICD-10-CM | POA: Diagnosis not present

## 2017-05-09 DIAGNOSIS — R918 Other nonspecific abnormal finding of lung field: Secondary | ICD-10-CM | POA: Diagnosis not present

## 2017-05-09 DIAGNOSIS — M479 Spondylosis, unspecified: Secondary | ICD-10-CM | POA: Diagnosis not present

## 2017-05-09 DIAGNOSIS — J690 Pneumonitis due to inhalation of food and vomit: Secondary | ICD-10-CM | POA: Diagnosis not present

## 2017-05-09 DIAGNOSIS — I080 Rheumatic disorders of both mitral and aortic valves: Secondary | ICD-10-CM | POA: Diagnosis not present

## 2017-05-09 DIAGNOSIS — M6282 Rhabdomyolysis: Secondary | ICD-10-CM | POA: Diagnosis not present

## 2017-05-09 DIAGNOSIS — E1151 Type 2 diabetes mellitus with diabetic peripheral angiopathy without gangrene: Secondary | ICD-10-CM | POA: Diagnosis not present

## 2017-05-09 DIAGNOSIS — J9602 Acute respiratory failure with hypercapnia: Secondary | ICD-10-CM | POA: Diagnosis not present

## 2017-05-09 DIAGNOSIS — T148XXA Other injury of unspecified body region, initial encounter: Secondary | ICD-10-CM | POA: Diagnosis not present

## 2017-05-09 DIAGNOSIS — G931 Anoxic brain damage, not elsewhere classified: Secondary | ICD-10-CM | POA: Diagnosis not present

## 2017-05-09 DIAGNOSIS — Z96641 Presence of right artificial hip joint: Secondary | ICD-10-CM | POA: Diagnosis not present

## 2017-05-09 DIAGNOSIS — Z978 Presence of other specified devices: Secondary | ICD-10-CM | POA: Diagnosis not present

## 2017-05-09 DIAGNOSIS — E872 Acidosis: Secondary | ICD-10-CM | POA: Diagnosis not present

## 2017-05-09 DIAGNOSIS — Z87891 Personal history of nicotine dependence: Secondary | ICD-10-CM

## 2017-05-09 DIAGNOSIS — Z9689 Presence of other specified functional implants: Secondary | ICD-10-CM | POA: Diagnosis not present

## 2017-05-09 DIAGNOSIS — I7410 Embolism and thrombosis of unspecified parts of aorta: Secondary | ICD-10-CM | POA: Diagnosis not present

## 2017-05-09 DIAGNOSIS — J9690 Respiratory failure, unspecified, unspecified whether with hypoxia or hypercapnia: Secondary | ICD-10-CM

## 2017-05-09 DIAGNOSIS — I469 Cardiac arrest, cause unspecified: Secondary | ICD-10-CM | POA: Diagnosis not present

## 2017-05-09 DIAGNOSIS — I472 Ventricular tachycardia: Secondary | ICD-10-CM | POA: Diagnosis not present

## 2017-05-09 DIAGNOSIS — I130 Hypertensive heart and chronic kidney disease with heart failure and stage 1 through stage 4 chronic kidney disease, or unspecified chronic kidney disease: Secondary | ICD-10-CM | POA: Diagnosis not present

## 2017-05-09 DIAGNOSIS — J90 Pleural effusion, not elsewhere classified: Secondary | ICD-10-CM | POA: Diagnosis not present

## 2017-05-09 DIAGNOSIS — R140 Abdominal distension (gaseous): Secondary | ICD-10-CM | POA: Diagnosis not present

## 2017-05-09 DIAGNOSIS — E785 Hyperlipidemia, unspecified: Secondary | ICD-10-CM | POA: Diagnosis not present

## 2017-05-09 DIAGNOSIS — Z90710 Acquired absence of both cervix and uterus: Secondary | ICD-10-CM

## 2017-05-09 DIAGNOSIS — N170 Acute kidney failure with tubular necrosis: Secondary | ICD-10-CM | POA: Diagnosis not present

## 2017-05-09 DIAGNOSIS — K551 Chronic vascular disorders of intestine: Secondary | ICD-10-CM | POA: Diagnosis not present

## 2017-05-09 DIAGNOSIS — Z9889 Other specified postprocedural states: Secondary | ICD-10-CM | POA: Diagnosis not present

## 2017-05-09 DIAGNOSIS — R579 Shock, unspecified: Secondary | ICD-10-CM | POA: Diagnosis not present

## 2017-05-09 DIAGNOSIS — Z9911 Dependence on respirator [ventilator] status: Secondary | ICD-10-CM | POA: Diagnosis not present

## 2017-05-09 DIAGNOSIS — N280 Ischemia and infarction of kidney: Secondary | ICD-10-CM | POA: Diagnosis not present

## 2017-05-09 DIAGNOSIS — Z66 Do not resuscitate: Secondary | ICD-10-CM | POA: Diagnosis not present

## 2017-05-09 DIAGNOSIS — I5032 Chronic diastolic (congestive) heart failure: Secondary | ICD-10-CM | POA: Diagnosis not present

## 2017-05-09 DIAGNOSIS — Z794 Long term (current) use of insulin: Secondary | ICD-10-CM

## 2017-05-09 DIAGNOSIS — Z9842 Cataract extraction status, left eye: Secondary | ICD-10-CM

## 2017-05-09 DIAGNOSIS — K922 Gastrointestinal hemorrhage, unspecified: Secondary | ICD-10-CM | POA: Diagnosis not present

## 2017-05-09 DIAGNOSIS — I351 Nonrheumatic aortic (valve) insufficiency: Secondary | ICD-10-CM | POA: Diagnosis not present

## 2017-05-09 DIAGNOSIS — Z9841 Cataract extraction status, right eye: Secondary | ICD-10-CM

## 2017-05-09 DIAGNOSIS — I4891 Unspecified atrial fibrillation: Secondary | ICD-10-CM | POA: Diagnosis not present

## 2017-05-09 DIAGNOSIS — J9600 Acute respiratory failure, unspecified whether with hypoxia or hypercapnia: Secondary | ICD-10-CM | POA: Diagnosis not present

## 2017-05-09 DIAGNOSIS — I700 Atherosclerosis of aorta: Secondary | ICD-10-CM | POA: Diagnosis present

## 2017-05-09 DIAGNOSIS — Z7982 Long term (current) use of aspirin: Secondary | ICD-10-CM

## 2017-05-09 DIAGNOSIS — Z881 Allergy status to other antibiotic agents status: Secondary | ICD-10-CM

## 2017-05-09 DIAGNOSIS — I252 Old myocardial infarction: Secondary | ICD-10-CM | POA: Diagnosis not present

## 2017-05-09 DIAGNOSIS — Z833 Family history of diabetes mellitus: Secondary | ICD-10-CM

## 2017-05-09 DIAGNOSIS — Z86718 Personal history of other venous thrombosis and embolism: Secondary | ICD-10-CM | POA: Diagnosis not present

## 2017-05-09 DIAGNOSIS — Z85828 Personal history of other malignant neoplasm of skin: Secondary | ICD-10-CM

## 2017-05-09 DIAGNOSIS — I7409 Other arterial embolism and thrombosis of abdominal aorta: Secondary | ICD-10-CM | POA: Diagnosis not present

## 2017-05-09 DIAGNOSIS — Z452 Encounter for adjustment and management of vascular access device: Secondary | ICD-10-CM | POA: Diagnosis not present

## 2017-05-09 DIAGNOSIS — I745 Embolism and thrombosis of iliac artery: Secondary | ICD-10-CM | POA: Diagnosis not present

## 2017-05-09 DIAGNOSIS — Z882 Allergy status to sulfonamides status: Secondary | ICD-10-CM

## 2017-05-09 DIAGNOSIS — R188 Other ascites: Secondary | ICD-10-CM

## 2017-05-09 DIAGNOSIS — I2510 Atherosclerotic heart disease of native coronary artery without angina pectoris: Secondary | ICD-10-CM | POA: Diagnosis present

## 2017-05-09 DIAGNOSIS — E1122 Type 2 diabetes mellitus with diabetic chronic kidney disease: Secondary | ICD-10-CM | POA: Diagnosis present

## 2017-05-09 DIAGNOSIS — I7419 Embolism and thrombosis of other parts of aorta: Secondary | ICD-10-CM | POA: Diagnosis not present

## 2017-05-09 DIAGNOSIS — Z9641 Presence of insulin pump (external) (internal): Secondary | ICD-10-CM | POA: Diagnosis present

## 2017-05-09 HISTORY — PX: AXILLARY-FEMORAL BYPASS GRAFT: SHX894

## 2017-05-09 LAB — COMPREHENSIVE METABOLIC PANEL
ALT: 39 U/L (ref 14–54)
ANION GAP: 18 — AB (ref 5–15)
AST: 61 U/L — ABNORMAL HIGH (ref 15–41)
Albumin: 3.9 g/dL (ref 3.5–5.0)
Alkaline Phosphatase: 157 U/L — ABNORMAL HIGH (ref 38–126)
BUN: 46 mg/dL — ABNORMAL HIGH (ref 6–20)
CHLORIDE: 99 mmol/L — AB (ref 101–111)
CO2: 21 mmol/L — AB (ref 22–32)
Calcium: 10.3 mg/dL (ref 8.9–10.3)
Creatinine, Ser: 1.49 mg/dL — ABNORMAL HIGH (ref 0.44–1.00)
GFR, EST AFRICAN AMERICAN: 35 mL/min — AB (ref >60)
GFR, EST NON AFRICAN AMERICAN: 30 mL/min — AB (ref >60)
Glucose, Bld: 460 mg/dL — ABNORMAL HIGH (ref 65–99)
Potassium: 4.5 mmol/L (ref 3.5–5.1)
SODIUM: 138 mmol/L (ref 135–145)
Total Bilirubin: 1 mg/dL (ref 0.3–1.2)
Total Protein: 9.6 g/dL — ABNORMAL HIGH (ref 6.5–8.1)

## 2017-05-09 LAB — CBC
HCT: 48.1 % — ABNORMAL HIGH (ref 36.0–46.0)
Hemoglobin: 15.4 g/dL — ABNORMAL HIGH (ref 12.0–15.0)
MCH: 31.6 pg (ref 26.0–34.0)
MCHC: 32 g/dL (ref 30.0–36.0)
MCV: 98.6 fL (ref 78.0–100.0)
PLATELETS: 207 10*3/uL (ref 150–400)
RBC: 4.88 MIL/uL (ref 3.87–5.11)
RDW: 15.9 % — AB (ref 11.5–15.5)
WBC: 14.6 10*3/uL — ABNORMAL HIGH (ref 4.0–10.5)

## 2017-05-09 LAB — POCT I-STAT 7, (LYTES, BLD GAS, ICA,H+H)
Acid-Base Excess: 2 mmol/L (ref 0.0–2.0)
Bicarbonate: 28.2 mmol/L — ABNORMAL HIGH (ref 20.0–28.0)
Calcium, Ion: 1.25 mmol/L (ref 1.15–1.40)
HCT: 38 % (ref 36.0–46.0)
Hemoglobin: 12.9 g/dL (ref 12.0–15.0)
O2 Saturation: 98 %
POTASSIUM: 5.2 mmol/L — AB (ref 3.5–5.1)
Patient temperature: 35.7
SODIUM: 144 mmol/L (ref 135–145)
TCO2: 30 mmol/L (ref 22–32)
pCO2 arterial: 45.8 mmHg (ref 32.0–48.0)
pH, Arterial: 7.393 (ref 7.350–7.450)
pO2, Arterial: 107 mmHg (ref 83.0–108.0)

## 2017-05-09 LAB — TROPONIN I

## 2017-05-09 LAB — GLUCOSE, CAPILLARY
GLUCOSE-CAPILLARY: 298 mg/dL — AB (ref 65–99)
GLUCOSE-CAPILLARY: 151 mg/dL — AB (ref 65–99)
GLUCOSE-CAPILLARY: 176 mg/dL — AB (ref 65–99)
Glucose-Capillary: 164 mg/dL — ABNORMAL HIGH (ref 65–99)

## 2017-05-09 LAB — I-STAT CHEM 8, ED
BUN: 51 mg/dL — AB (ref 6–20)
CALCIUM ION: 1.13 mmol/L — AB (ref 1.15–1.40)
Chloride: 103 mmol/L (ref 101–111)
Creatinine, Ser: 1.3 mg/dL — ABNORMAL HIGH (ref 0.44–1.00)
GLUCOSE: 488 mg/dL — AB (ref 65–99)
HCT: 53 % — ABNORMAL HIGH (ref 36.0–46.0)
Hemoglobin: 18 g/dL — ABNORMAL HIGH (ref 12.0–15.0)
Potassium: 4.6 mmol/L (ref 3.5–5.1)
SODIUM: 142 mmol/L (ref 135–145)
TCO2: 24 mmol/L (ref 22–32)

## 2017-05-09 LAB — POCT I-STAT 4, (NA,K, GLUC, HGB,HCT)
Glucose, Bld: 165 mg/dL — ABNORMAL HIGH (ref 65–99)
HEMATOCRIT: 39 % (ref 36.0–46.0)
Hemoglobin: 13.3 g/dL (ref 12.0–15.0)
Potassium: 5.2 mmol/L — ABNORMAL HIGH (ref 3.5–5.1)
Sodium: 144 mmol/L (ref 135–145)

## 2017-05-09 LAB — SURGICAL PCR SCREEN
MRSA, PCR: NEGATIVE
STAPHYLOCOCCUS AUREUS: NEGATIVE

## 2017-05-09 LAB — CBG MONITORING, ED
Glucose-Capillary: 480 mg/dL — ABNORMAL HIGH (ref 65–99)
Glucose-Capillary: 431 mg/dL — ABNORMAL HIGH (ref 65–99)

## 2017-05-09 LAB — PREPARE RBC (CROSSMATCH)

## 2017-05-09 LAB — I-STAT CG4 LACTIC ACID, ED: LACTIC ACID, VENOUS: 6.25 mmol/L — AB (ref 0.5–1.9)

## 2017-05-09 SURGERY — CREATION, BYPASS, ARTERIAL, AXILLARY TO BILATERAL FEMORAL, USING GRAFT
Anesthesia: General | Laterality: Bilateral

## 2017-05-09 MED ORDER — TORSEMIDE 20 MG PO TABS: 20.0000 mg | ORAL_TABLET | Freq: Two times a day (BID) | ORAL | Status: DC

## 2017-05-09 MED ORDER — ROCURONIUM BROMIDE 100 MG/10ML IV SOLN
INTRAVENOUS | Status: DC | PRN
  Administered 2017-05-09: 10 mg via INTRAVENOUS
  Administered 2017-05-09: 70 mg via INTRAVENOUS

## 2017-05-09 MED ORDER — FENTANYL CITRATE (PF) 100 MCG/2ML IJ SOLN
100.0000 ug | Freq: Once | INTRAMUSCULAR | Status: AC
  Administered 2017-05-09: 100 ug via INTRAVENOUS
  Filled 2017-05-09: qty 2

## 2017-05-09 MED ORDER — HYDRALAZINE HCL 20 MG/ML IJ SOLN: 5.0000 mg | INTRAMUSCULAR | Status: DC | PRN

## 2017-05-09 MED ORDER — SODIUM CHLORIDE 0.9% FLUSH
10.0000 mL | Freq: Two times a day (BID) | INTRAVENOUS | Status: DC
  Administered 2017-05-09 – 2017-05-11 (×3): 10 mL

## 2017-05-09 MED ORDER — FENTANYL CITRATE (PF) 100 MCG/2ML IJ SOLN: 25.0000 ug | INTRAMUSCULAR | Status: DC | PRN

## 2017-05-09 MED ORDER — ACETAMINOPHEN 325 MG PO TABS: 325.0000 mg | ORAL_TABLET | ORAL | Status: DC | PRN

## 2017-05-09 MED ORDER — FENTANYL CITRATE (PF) 100 MCG/2ML IJ SOLN
100.0000 ug | Freq: Once | INTRAMUSCULAR | Status: AC
Start: 2017-05-09 — End: 2017-05-09
  Administered 2017-05-09: 100 ug via INTRAVENOUS
  Filled 2017-05-09: qty 2

## 2017-05-09 MED ORDER — HEMOSTATIC AGENTS (NO CHARGE) OPTIME
TOPICAL | Status: DC | PRN
  Administered 2017-05-09: 1 via TOPICAL

## 2017-05-09 MED ORDER — MIDAZOLAM HCL 2 MG/2ML IJ SOLN
INTRAMUSCULAR | Status: AC
  Filled 2017-05-09: qty 2

## 2017-05-09 MED ORDER — ONDANSETRON HCL 4 MG/2ML IJ SOLN
4.0000 mg | Freq: Once | INTRAMUSCULAR | Status: AC
  Administered 2017-05-09: 4 mg via INTRAVENOUS
  Filled 2017-05-09: qty 2

## 2017-05-09 MED ORDER — POTASSIUM CHLORIDE CRYS ER 20 MEQ PO TBCR: 20.0000 meq | EXTENDED_RELEASE_TABLET | Freq: Every day | ORAL | Status: DC | PRN

## 2017-05-09 MED ORDER — MORPHINE SULFATE (PF) 4 MG/ML IV SOLN
0.5000 mg | INTRAVENOUS | Status: DC | PRN
  Filled 2017-05-09: qty 1

## 2017-05-09 MED ORDER — ACETAMINOPHEN 500 MG PO TABS: 500.0000 mg | ORAL_TABLET | Freq: Four times a day (QID) | ORAL | Status: DC | PRN

## 2017-05-09 MED ORDER — POTASSIUM CHLORIDE CRYS ER 20 MEQ PO TBCR: 20.0000 meq | EXTENDED_RELEASE_TABLET | Freq: Two times a day (BID) | ORAL | Status: DC

## 2017-05-09 MED ORDER — FENTANYL CITRATE (PF) 250 MCG/5ML IJ SOLN
INTRAMUSCULAR | Status: AC
  Filled 2017-05-09: qty 5

## 2017-05-09 MED ORDER — VANCOMYCIN HCL IN DEXTROSE 1-5 GM/200ML-% IV SOLN
1000.0000 mg | Freq: Two times a day (BID) | INTRAVENOUS | Status: DC
  Administered 2017-05-09: 1000 mg via INTRAVENOUS
  Filled 2017-05-09 (×2): qty 200

## 2017-05-09 MED ORDER — AMLODIPINE BESYLATE 5 MG PO TABS: 5.0000 mg | ORAL_TABLET | Freq: Every day | ORAL | Status: DC

## 2017-05-09 MED ORDER — HEPARIN BOLUS VIA INFUSION
3800.0000 [IU] | Freq: Once | INTRAVENOUS | Status: AC
  Administered 2017-05-09: 3800 [IU] via INTRAVENOUS
  Filled 2017-05-09: qty 3800

## 2017-05-09 MED ORDER — SODIUM CHLORIDE 0.9 % IV SOLN
INTRAVENOUS | Status: DC | PRN
  Administered 2017-05-09: 500 mL

## 2017-05-09 MED ORDER — LACTATED RINGERS IV SOLN
INTRAVENOUS | Status: DC | PRN
  Administered 2017-05-09: 18:00:00 via INTRAVENOUS

## 2017-05-09 MED ORDER — SODIUM CHLORIDE 0.9 % IV SOLN: Freq: Once | INTRAVENOUS | Status: DC

## 2017-05-09 MED ORDER — PANTOPRAZOLE SODIUM 40 MG PO TBEC: 40.0000 mg | DELAYED_RELEASE_TABLET | Freq: Every day | ORAL | Status: DC

## 2017-05-09 MED ORDER — PRAVASTATIN SODIUM 20 MG PO TABS: 20.0000 mg | ORAL_TABLET | Freq: Every day | ORAL | Status: DC

## 2017-05-09 MED ORDER — SUGAMMADEX SODIUM 200 MG/2ML IV SOLN
INTRAVENOUS | Status: DC | PRN
  Administered 2017-05-09: 300 mg via INTRAVENOUS

## 2017-05-09 MED ORDER — DOCUSATE SODIUM 100 MG PO CAPS: 100.0000 mg | ORAL_CAPSULE | Freq: Every day | ORAL | Status: DC

## 2017-05-09 MED ORDER — DEXTROSE-NACL 5-0.45 % IV SOLN: INTRAVENOUS | Status: DC

## 2017-05-09 MED ORDER — ONDANSETRON HCL 4 MG/2ML IJ SOLN: 4.0000 mg | Freq: Four times a day (QID) | INTRAMUSCULAR | Status: DC | PRN

## 2017-05-09 MED ORDER — ALUM & MAG HYDROXIDE-SIMETH 200-200-20 MG/5ML PO SUSP: 15.0000 mL | ORAL | Status: DC | PRN

## 2017-05-09 MED ORDER — CETIRIZINE HCL 10 MG PO TABS
5.0000 mg | ORAL_TABLET | Freq: Every evening | ORAL | Status: DC
  Filled 2017-05-09: qty 1

## 2017-05-09 MED ORDER — PHENOL 1.4 % MT LIQD: 1.0000 | OROMUCOSAL | Status: DC | PRN

## 2017-05-09 MED ORDER — OXYCODONE HCL 5 MG/5ML PO SOLN: 5.0000 mg | Freq: Once | ORAL | Status: DC | PRN

## 2017-05-09 MED ORDER — SODIUM CHLORIDE 0.9% FLUSH: 10.0000 mL | INTRAVENOUS | Status: DC | PRN

## 2017-05-09 MED ORDER — GABAPENTIN 300 MG PO CAPS: 600.0000 mg | ORAL_CAPSULE | Freq: Every day | ORAL | Status: DC

## 2017-05-09 MED ORDER — GUAIFENESIN-DM 100-10 MG/5ML PO SYRP: 15.0000 mL | ORAL_SOLUTION | ORAL | Status: DC | PRN

## 2017-05-09 MED ORDER — HEPARIN (PORCINE) IN NACL 100-0.45 UNIT/ML-% IJ SOLN
1000.0000 [IU]/h | INTRAMUSCULAR | Status: DC
  Administered 2017-05-09: 1000 [IU]/h via INTRAVENOUS
  Filled 2017-05-09: qty 250

## 2017-05-09 MED ORDER — ONDANSETRON HCL 4 MG/2ML IJ SOLN: 4.0000 mg | Freq: Once | INTRAMUSCULAR | Status: DC | PRN

## 2017-05-09 MED ORDER — ONDANSETRON HCL 4 MG/2ML IJ SOLN
INTRAMUSCULAR | Status: DC | PRN
  Administered 2017-05-09: 4 mg via INTRAVENOUS

## 2017-05-09 MED ORDER — CHLORHEXIDINE GLUCONATE CLOTH 2 % EX PADS
6.0000 | MEDICATED_PAD | Freq: Every day | CUTANEOUS | Status: DC
  Administered 2017-05-10: 6 via TOPICAL

## 2017-05-09 MED ORDER — ASPIRIN EC 81 MG PO TBEC: 81.0000 mg | DELAYED_RELEASE_TABLET | Freq: Two times a day (BID) | ORAL | Status: DC

## 2017-05-09 MED ORDER — CEFUROXIME SODIUM 750 MG IJ SOLR
INTRAMUSCULAR | Status: AC
  Filled 2017-05-09: qty 1500

## 2017-05-09 MED ORDER — SODIUM CHLORIDE 0.9 % IV SOLN
INTRAVENOUS | Status: DC
  Administered 2017-05-09: 4.2 [IU]/h via INTRAVENOUS
  Filled 2017-05-09: qty 1

## 2017-05-09 MED ORDER — PROPOFOL 10 MG/ML IV BOLUS
INTRAVENOUS | Status: DC | PRN
  Administered 2017-05-09: 70 mg via INTRAVENOUS

## 2017-05-09 MED ORDER — OXYCODONE HCL 5 MG PO TABS: 5.0000 mg | ORAL_TABLET | Freq: Once | ORAL | Status: DC | PRN

## 2017-05-09 MED ORDER — TRAMADOL HCL 50 MG PO TABS: 100.0000 mg | ORAL_TABLET | Freq: Four times a day (QID) | ORAL | Status: DC | PRN

## 2017-05-09 MED ORDER — SODIUM CHLORIDE 0.9 % IV SOLN
INTRAVENOUS | Status: DC
  Administered 2017-05-09: 22:00:00 via INTRAVENOUS

## 2017-05-09 MED ORDER — METOPROLOL TARTRATE 5 MG/5ML IV SOLN: 2.0000 mg | INTRAVENOUS | Status: DC | PRN

## 2017-05-09 MED ORDER — CEFUROXIME SODIUM 1.5 G IV SOLR
INTRAVENOUS | Status: DC | PRN
  Administered 2017-05-09: 1.5 g via INTRAVENOUS

## 2017-05-09 MED ORDER — EPHEDRINE SULFATE 50 MG/ML IJ SOLN
INTRAMUSCULAR | Status: DC | PRN
  Administered 2017-05-09 (×2): 5 mg via INTRAVENOUS

## 2017-05-09 MED ORDER — ACETAMINOPHEN 325 MG RE SUPP: 325.0000 mg | RECTAL | Status: DC | PRN

## 2017-05-09 MED ORDER — METOPROLOL TARTRATE 50 MG PO TABS: 100.0000 mg | ORAL_TABLET | Freq: Two times a day (BID) | ORAL | Status: DC

## 2017-05-09 MED ORDER — OXYCODONE-ACETAMINOPHEN 5-325 MG PO TABS: 1.0000 | ORAL_TABLET | ORAL | Status: DC | PRN

## 2017-05-09 MED ORDER — CALCIUM CARBONATE-VITAMIN D 500-200 MG-UNIT PO TABS: 1.0000 | ORAL_TABLET | Freq: Two times a day (BID) | ORAL | Status: DC

## 2017-05-09 MED ORDER — FENTANYL CITRATE (PF) 100 MCG/2ML IJ SOLN
INTRAMUSCULAR | Status: DC | PRN
Start: 2017-05-09 — End: 2017-05-09
  Administered 2017-05-09: 50 ug via INTRAVENOUS
  Administered 2017-05-09: 100 ug via INTRAVENOUS
  Administered 2017-05-09: 50 ug via INTRAVENOUS

## 2017-05-09 MED ORDER — 0.9 % SODIUM CHLORIDE (POUR BTL) OPTIME
TOPICAL | Status: DC | PRN
  Administered 2017-05-09: 2000 mL

## 2017-05-09 MED ORDER — IODIXANOL 320 MG/ML IV SOLN
INTRAVENOUS | Status: DC | PRN
  Administered 2017-05-09: 40 mL via INTRA_ARTERIAL

## 2017-05-09 MED ORDER — PROPOFOL 10 MG/ML IV BOLUS
INTRAVENOUS | Status: AC
  Filled 2017-05-09: qty 20

## 2017-05-09 MED ORDER — PHENYLEPHRINE HCL 10 MG/ML IJ SOLN
INTRAVENOUS | Status: DC | PRN
  Administered 2017-05-09: 40 ug/min via INTRAVENOUS

## 2017-05-09 MED ORDER — MAGNESIUM SULFATE 2 GM/50ML IV SOLN: 2.0000 g | Freq: Every day | INTRAVENOUS | Status: DC | PRN

## 2017-05-09 MED ORDER — LIDOCAINE HCL (CARDIAC) 20 MG/ML IV SOLN
INTRAVENOUS | Status: DC | PRN
  Administered 2017-05-09: 60 mg via INTRAVENOUS

## 2017-05-09 MED ORDER — SUCCINYLCHOLINE CHLORIDE 20 MG/ML IJ SOLN
INTRAMUSCULAR | Status: DC | PRN
  Administered 2017-05-09: 100 mg via INTRAVENOUS

## 2017-05-09 MED ORDER — IOPAMIDOL (ISOVUE-370) INJECTION 76%
INTRAVENOUS | Status: AC
  Administered 2017-05-09: 80 mL via INTRAVENOUS
  Filled 2017-05-09: qty 100

## 2017-05-09 MED ORDER — LABETALOL HCL 5 MG/ML IV SOLN: 10.0000 mg | INTRAVENOUS | Status: DC | PRN

## 2017-05-09 MED ORDER — SODIUM CHLORIDE 0.9 % IV SOLN
500.0000 mL | Freq: Once | INTRAVENOUS | Status: AC | PRN
  Administered 2017-05-10: 500 mL via INTRAVENOUS

## 2017-05-09 MED ORDER — HEPARIN (PORCINE) IN NACL 100-0.45 UNIT/ML-% IJ SOLN
500.0000 [IU]/h | INTRAMUSCULAR | Status: DC
  Administered 2017-05-09: 500 [IU]/h via INTRAVENOUS
  Filled 2017-05-09: qty 250

## 2017-05-09 SURGICAL SUPPLY — 67 items
ADH SKN CLS APL DERMABOND .7 (GAUZE/BANDAGES/DRESSINGS) ×2
AGENT HMST SPONGE THK3/8 (HEMOSTASIS)
BAG BANDED W/RUBBER/TAPE 36X54 (MISCELLANEOUS) ×2 IMPLANT
BAG EQP BAND 135X91 W/RBR TAPE (MISCELLANEOUS) ×1
CANISTER SUCT 3000ML PPV (MISCELLANEOUS) ×3 IMPLANT
CATH EMB 3FR 80CM (CATHETERS) ×4 IMPLANT
CATH EMB 4FR 80CM (CATHETERS) ×4 IMPLANT
CATH EMB 5FR 80CM (CATHETERS) ×2 IMPLANT
CATH OMNI FLUSH .035X70CM (CATHETERS) ×2 IMPLANT
CLIP VESOCCLUDE MED 24/CT (CLIP) ×3 IMPLANT
CLIP VESOCCLUDE SM WIDE 24/CT (CLIP) IMPLANT
COVER BACK TABLE 80X110 HD (DRAPES) ×2 IMPLANT
DERMABOND ADVANCED (GAUZE/BANDAGES/DRESSINGS) ×4
DERMABOND ADVANCED .7 DNX12 (GAUZE/BANDAGES/DRESSINGS) ×1 IMPLANT
DEVICE TORQUE KENDALL .025-038 (MISCELLANEOUS) ×3 IMPLANT
DRAIN SNY 10X20 3/4 PERF (WOUND CARE) IMPLANT
DRAPE INCISE IOBAN 66X45 STRL (DRAPES) ×3 IMPLANT
DRAPE X-RAY CASS 24X20 (DRAPES) IMPLANT
ELECT REM PT RETURN 9FT ADLT (ELECTROSURGICAL) ×3
ELECTRODE REM PT RTRN 9FT ADLT (ELECTROSURGICAL) ×1 IMPLANT
EVACUATOR SILICONE 100CC (DRAIN) IMPLANT
GAUZE SPONGE 4X4 16PLY XRAY LF (GAUZE/BANDAGES/DRESSINGS) IMPLANT
GLOVE BIO SURGEON STRL SZ 6.5 (GLOVE) ×2 IMPLANT
GLOVE BIO SURGEON STRL SZ7.5 (GLOVE) ×5 IMPLANT
GLOVE BIO SURGEONS STRL SZ 6.5 (GLOVE) ×1
GLOVE BIOGEL PI IND STRL 6.5 (GLOVE) ×2 IMPLANT
GLOVE BIOGEL PI IND STRL 7.0 (GLOVE) IMPLANT
GLOVE BIOGEL PI INDICATOR 6.5 (GLOVE) ×4
GLOVE BIOGEL PI INDICATOR 7.0 (GLOVE) ×2
GOWN STRL REUS W/ TWL LRG LVL3 (GOWN DISPOSABLE) ×2 IMPLANT
GOWN STRL REUS W/ TWL XL LVL3 (GOWN DISPOSABLE) ×1 IMPLANT
GOWN STRL REUS W/TWL LRG LVL3 (GOWN DISPOSABLE) ×6
GOWN STRL REUS W/TWL XL LVL3 (GOWN DISPOSABLE) ×3
GUIDEWIRE ANGLED .035X150CM (WIRE) ×2 IMPLANT
HEMOSTAT SNOW SURGICEL 2X4 (HEMOSTASIS) ×3 IMPLANT
HEMOSTAT SPONGE AVITENE ULTRA (HEMOSTASIS) IMPLANT
INSERT FOGARTY SM (MISCELLANEOUS) IMPLANT
KIT BASIN OR (CUSTOM PROCEDURE TRAY) ×3 IMPLANT
KIT ROOM TURNOVER OR (KITS) ×3 IMPLANT
NS IRRIG 1000ML POUR BTL (IV SOLUTION) ×6 IMPLANT
PACK PERIPHERAL VASCULAR (CUSTOM PROCEDURE TRAY) ×3 IMPLANT
PAD ARMBOARD 7.5X6 YLW CONV (MISCELLANEOUS) ×6 IMPLANT
SET COLLECT BLD 21X3/4 12 (NEEDLE) IMPLANT
SHEATH PINNACLE 5FR (SHEATH) ×2 IMPLANT
STOPCOCK 4 WAY LG BORE MALE ST (IV SETS) IMPLANT
SUT GORETEX 5 0 TT13 24 (SUTURE) IMPLANT
SUT GORETEX 6.0 TT9 (SUTURE) IMPLANT
SUT MNCRL AB 4-0 PS2 18 (SUTURE) ×6 IMPLANT
SUT PROLENE 5 0 C 1 24 (SUTURE) ×6 IMPLANT
SUT PROLENE 5 0 C 1 36 (SUTURE) ×2 IMPLANT
SUT PROLENE 6 0 BV (SUTURE) ×3 IMPLANT
SUT PROLENE 6 0 CC (SUTURE) IMPLANT
SUT SILK 2 0 SH (SUTURE) IMPLANT
SUT VIC AB 2-0 CT1 27 (SUTURE) ×6
SUT VIC AB 2-0 CT1 TAPERPNT 27 (SUTURE) ×2 IMPLANT
SUT VIC AB 3-0 SH 27 (SUTURE) ×6
SUT VIC AB 3-0 SH 27X BRD (SUTURE) ×2 IMPLANT
SYR 10ML LL (SYRINGE) ×4 IMPLANT
SYRINGE 20CC LL (MISCELLANEOUS) ×2 IMPLANT
SYRINGE 3CC LL L/F (MISCELLANEOUS) ×4 IMPLANT
TAPE UMBILICAL COTTON 1/8X30 (MISCELLANEOUS) IMPLANT
TERUMO PINNACLE INTRODUCER SHEATH ×2 IMPLANT
TRAY FOLEY W/METER SILVER 16FR (SET/KITS/TRAYS/PACK) ×3 IMPLANT
TUBING EXTENTION W/L.L. (IV SETS) IMPLANT
TUBING HIGH PRESSURE 120CM (CONNECTOR) ×4 IMPLANT
WATER STERILE IRR 1000ML POUR (IV SOLUTION) ×3 IMPLANT
WIRE BENTSON .035X145CM (WIRE) ×4 IMPLANT

## 2017-05-09 NOTE — H&P (Signed)
HP    Reason for Consult:  Acute aortic occlusion Referring Physician:  Venora Maples (ED) MRN #:  [id number]  History of Present Illness: This is a 83 y.o. female with history of afib not on AC given fall risk, previous bilateral iliac artery stenting followed by Dr. Fletcher [name], ckd, diabetes with insulin pump, previous smoker. Today was walking well at 0900. Was using restroom around 1100 and could not stand up. Was brought via ems to ED. Cannot move legs, cannot feel legs. Denies having this before. Was living with son and independent prior. Takes aspirin no blood thinners.  Past Medical History:  Diagnosis Date  . Acute respiratory failure ([place])    12/2011 admission - decreased O2 sats on ambulation, improved by time of discharge  . Aortic stenosis    Moderate by echo 4/13 - mean 20 mmHg  . Arthritis    "back" (08/24/2015)  . Atrial fibrillation ([place])    not a coumadin candidate secondary to fall risk  . CAD (coronary artery disease)    minimal plaque by cath 5/12  . CHF (congestive heart failure) ([facility])    EF 55-60%  . Chronic atrial fibrillation with RVR ([facility])   . Chronic diastolic heart failure ([facility])    Echocardiogram 12/10/11: Moderate LVH, EF 65-70%, moderate aortic stenosis, mean gradient 20, mild MS  . Chronic mid back pain   . DJD (degenerative joint disease) of hip    s/p R THR 10/2010  . DVT (deep venous thrombosis) ([place]) ~ 2012   LLE  . History of blood transfusion 1957   "related to childbirth"  . Hyperlipidemia   . Hypertension   . Insulin pump in place   . Iron deficiency anemia   . Kidney stones ~ 1958   "no OR"  . Migraine    "none since my hysterectomy" (08/24/2015)  . Mitral stenosis    Mild by echo 4/13  . Myocardial infarction ([place]) 12/2010  . PAD (peripheral artery disease) ([facility])    s/p bilateral comon iliac artery stenting in 2002. Known significant  R SFA  disease. carotid dz noted 11/2011 followed by Dr. Bridgett Larsson  . Skin cancer    right forehead/head  . Type II  diabetes mellitus ([place]) dx'd 1999  . Venous insufficiency     Past Surgical History:  Procedure Laterality Date  . ABDOMINAL HYSTERECTOMY    . APPENDECTOMY    . BACK SURGERY    . CARDIAC CATHETERIZATION  01/10/2011   No significant CAD  . CATARACT EXTRACTION, BILATERAL Bilateral 2015  . CHOLECYSTECTOMY OPEN    . ILIAC ARTERY STENT Bilateral 2002   [place]  . JOINT REPLACEMENT    . [place] SURGERY  1999  . REVISION TOTAL HIP ARTHROPLASTY  1994  . SKIN CANCER EXCISION  2016   top of right forehead/head  . TONSILLECTOMY    . TOTAL HIP ARTHROPLASTY Right 1991    Allergies  Allergen Reactions  . Carvedilol Other (See Comments)    Heart stops CARDIAC ARREST  . Augmentin [Amoxicillin-Pot Clavulanate] Diarrhea  . Clindamycin/Lincomycin Other (See Comments)    tremors  . Diltiazem Hcl Other (See Comments)    Chest pain;   07/02/14-  Patient has been able to tolerate diltiazem PO as inpatient since 06/29/14 without any adverse reaction.  . Fenofibrate Other (See Comments)    Unknown - [facility] - MAR  . Hctz [Hydrochlorothiazide] Other (See Comments)    Chest pain  . Nisoldipine Other (See Comments)  Chest pain  . Nitroglycerin Other (See Comments)    Unknown - [facility]  . Propranolol Diarrhea    Chest pain  . Tekturna [Aliskiren Fumarate] Other (See Comments)    Unknown reaction  . Valturna [Aliskiren-Valsartan] Other (See Comments)    Unknown reaction  . Septra [Sulfamethoxazole-Trimethoprim] Rash    Rash, cyst  . Sulfa Antibiotics Rash    Prior to Admission medications   Medication Sig Start Date End Date Taking? Authorizing Provider  acetaminophen (TYLENOL) 500 MG tablet Take 500-1,000 mg by mouth every 6 (six) hours as needed (for pain).    Yes [provider]  amLODipine (NORVASC) 5 MG tablet Take 5 mg by mouth daily.   Yes [provider]  ascorbic acid (VITAMIN C) 500 MG tablet Take 500 mg by mouth daily.   Yes [provider]    aspirin EC 81 MG tablet Take 81 mg by mouth 2 (two) times daily.   Yes [provider]  b complex vitamins tablet Take 1 tablet by mouth daily.     Yes [provider]  calcium-vitamin D (OSCAL WITH D) 500-200 MG-UNIT tablet Take 1 tablet by mouth 2 (two) times daily.   Yes [provider]  Cholecalciferol (VITAMIN D3) 2000 UNITS capsule Take 2,000 Units by mouth daily.     Yes [provider]  gabapentin (NEURONTIN) 300 MG capsule TAKE 2 CAPSULES BY MOUTH AT BEDTIME 02/21/17  Yes Janith Lima, MD  insulin lispro (HUMALOG) 100 UNIT/ML injection Inject into the skin continuous. INSULIN PUMP   Yes [provider]  levocetirizine (XYZAL) 5 MG tablet Take 1 tablet (5 mg total) by mouth every evening. 02/26/17  Yes Janith Lima, MD  metoprolol tartrate (LOPRESSOR) 100 MG tablet Take 1 tablet (100 mg total) by mouth 2 (two) times daily. 03/25/17  Yes Josue Hector, MD  omeprazole (PRILOSEC) 40 MG capsule TAKE 1 CAPSULE BY MOUTH  DAILY BEFORE BREAKFAST 11/21/16  Yes Janith Lima, MD  potassium chloride SA (K-DUR,KLOR-CON) 20 MEQ tablet Take 20 mEq by mouth 2 (two) times daily.    Yes [provider]  pravastatin (PRAVACHOL) 20 MG tablet Take 20 mg by mouth daily.   Yes [provider]  Probiotic Product (PROBIOTIC PO) Take 1 tablet by mouth daily at 10 pm.   Yes [provider]  torsemide (DEMADEX) 20 MG tablet Take 1 tablet (20 mg total) by mouth 2 (two) times daily. 01/03/17  Yes Wellington Hampshire, MD  traMADol (ULTRAM) 50 MG tablet TAKE 2 TABLETS BY MOUTH EVERY 6 HOURS AS NEEDED FOR PAIN 03/25/17  Yes Janith Lima, MD  Continuous Blood Gluc Sensor (FREESTYLE LIBRE SENSOR SYSTEM) MISC 1 Device by Does not apply route as directed. 1 device every 10 days 10/01/16   Renato Shin, MD  glucose blood (ONE TOUCH ULTRA TEST) test strip 1 each by Other route 4 (four) times daily. And lancets 4/day 01/10/17   Renato Shin, MD    Social  History   Social History  . Marital status: Widowed    Spouse name: N/A  . Number of children: N/A  . Years of education: N/A   Occupational History  . Retired         Social History Main Topics  . Smoking status: Former Smoker    Packs/day: 2.00    Years: 37.00    Types: Cigarettes    Quit date: 08/20/1985  . Smokeless tobacco: Never Used  .  Alcohol use No  . Drug use: No  . Sexual activity: No   Other Topics Concern  . Not on file   Social History Narrative   Lives with son in [place], [place]   Widowed 1991   Quit smoking in 1987     Family History  Problem Relation Age of Onset  . Diabetes Father   . Diabetes Other        5/8 sibs    REVIEW OF SYSTEMS (negative unless checked):   Cardiac:  []  Chest pain or chest pressure? []  Shortness of breath upon activity? []  Shortness of breath when lying flat? [x]  Irregular heart rhythm?  Vascular:  []  Pain in calf, thigh, or hip brought on by walking? []  Pain in feet at night that wakes you up from your sleep? []  Blood clot in your veins? [x]  Leg swelling?  Pulmonary:  []  Oxygen at home? []  Productive cough? []  Wheezing?  Neurologic:  []  Sudden weakness in arms or legs? []  Sudden numbness in arms or legs? []  Sudden onset of difficult speaking or slurred speech? []  Temporary loss of vision in one eye? []  Problems with dizziness?  Gastrointestinal:  []  Blood in stool? []  Vomited blood?  Genitourinary:  []  Burning when urinating? []  Blood in urine?  Psychiatric:  []  Major depression  Hematologic:  []  Bleeding problems? []  Problems with blood clotting?  Dermatologic:  []  Rashes or ulcers?  Constitutional:  []  Fever or chills?  Ear/Nose/Throat:  []  Change in hearing? []  Nose bleeds? []  Sore throat?  Musculoskeletal:  []  Back pain? []  Joint pain? []  Muscle pain?    Physical Examination  Vitals:   05/04/2017 1530 04/22/2017 1615  BP: (!) 180/85 (!) 188/82  Pulse: (!) 103 96  Resp: (!)  33 (!) 28  Temp:    SpO2: 90% 90%   Body mass index is 24.03 kg/m.   General:  nad HENT: WNL, normocephalic Pulmonary: normal non-labored breathing, without Rales, rhonchi,  wheezing Cardiac: palpable right radial pulse that is regular Non-palpable bilateral femoral arteries Abdomen: soft, mild distension Extremities: mottling bilateral thighs Chronic venous changes to bilateral legs Neurologic: A&O X 3;  SENSATION: normal; MOTOR FUNCTION:  moving all extremities equally. Speech is fluent/normal Psychiatric: appropriate mood and affect  CBC    Component Value Date/Time   WBC 14.6 (H) 05/18/2017 1233   RBC 4.88 05/13/2017 1233   HGB 18.0 (H) 05/14/2017 1255   HCT 53.0 (H) 04/29/2017 1255   PLT 207 05/06/2017 1233   MCV 98.6 05/01/2017 1233   MCH 31.6 05/06/2017 1233   MCHC 32.0 05/14/2017 1233   RDW 15.9 (H) 05/13/2017 1233   LYMPHSABS 1.9 08/28/2016 1433   MONOABS 1.0 08/28/2016 1433   EOSABS 0.2 08/28/2016 1433   BASOSABS 0.0 08/28/2016 1433    BMET    Component Value Date/Time   NA 142 05/01/2017 1255   NA 136 (A) 08/22/2016   K 4.6 04/24/2017 1255   CL 103 05/16/2017 1255   CO2 21 (L) 05/12/2017 1233   GLUCOSE 488 (H) 05/04/2017 1255   BUN 51 (H) 04/28/2017 1255   BUN 49 (A) 08/22/2016   CREATININE 1.30 (H) 05/08/2017 1255   CREATININE 1.66 (H) 01/01/2017 1138   CALCIUM 10.3 05/10/2017 1233   GFRNONAA 30 (L) 05/10/2017 1233   GFRAA 35 (L) 05/02/2017 1233    COAGS: Lab Results  Component Value Date   INR 1.18 12/25/2011   INR 1.01 01/10/2011   INR 1.84 (H) 10/26/2010  Non-Invasive Vascular Imaging:   IMPRESSION: Occlusion of the distal abdominal aorta just below the inferior mesenteric artery. Bilateral common iliac arteries are occluded.  Limited evaluation of the blood flow to the lower extremities due to the slow arterial flow and timing of the study.  Infarct involving the right kidney lower pole.  Critical stenosis and near  occlusion of the left renal artery.  Patchy lung disease bilaterally particularly in the posterior lower lobes. Some this could be related to atelectasis or asymmetric pulmonary edema. Infectious or inflammatory process cannot be completely excluded. Trace right pleural fluid.   ASSESSMENT/PLAN: This is a 83 y.o. female with multiple medical comorbid conditions now with acutely occluded aorta. Discussed likely mortality with no intervention and high likelihood of amputation, cardiopulmonary failure, death with intervention. She wants to proceed with intervention but does not want to have amputation. Discussed with her and son and he states that is poa. Will attempt aortic embolectomy via bilateral femoral approach with possible axillary bifemoral bypass grafting and possible lower extremity interventions including fasciotomies.   Milani Lowenstein C. Donzetta Matters, MD [facility] Office: [phone] Pager: [phone]

## 2017-05-09 NOTE — ED Notes (Signed)
PT appears to be more comfortable; not diaphorectic. Still complaining of back pain.

## 2017-05-09 NOTE — Anesthesia Postprocedure Evaluation (Signed)
Anesthesia Post Note  Patient: ILLYANA SCHORSCH  Procedure(s) Performed: Procedure(s) (LRB): Exposure of Bilateral Femoral Arteries; AortoIliac Thromboembolectomy; Aortagram   (Bilateral)     Patient location during evaluation: PACU Anesthesia Type: General Level of consciousness: awake and patient cooperative Pain management: pain level controlled Vital Signs Assessment: post-procedure vital signs reviewed and stable Respiratory status: spontaneous breathing, nonlabored ventilation, respiratory function stable and patient connected to nasal cannula oxygen Cardiovascular status: stable Postop Assessment: no apparent nausea or vomiting Anesthetic complications: no    Last Vitals:  Vitals:   04/20/2017 2100 05/10/2017 2115  BP: (!) 151/68 (!) 152/70  Pulse: 72 76  Resp: 19 20  Temp: (!) 36.1 C   SpO2: 96% 95%    Last Pain:  Vitals:   04/26/2017 2115  TempSrc:   PainSc: 0-No pain                 Bryton Waight

## 2017-05-09 NOTE — Transfer of Care (Signed)
Immediate Anesthesia Transfer of Care Note  Patient: Emma Yu  Procedure(s) Performed: Procedure(s): Exposure of Bilateral Femoral Arteries; AortoIliac Thromboembolectomy; Aortagram   (Bilateral)  Patient Location: PACU  Anesthesia Type:General  Level of Consciousness: awake  Airway & Oxygen Therapy: Patient Spontanous Breathing and Patient connected to face mask oxygen  Post-op Assessment: Report given to RN and Post -op Vital signs reviewed and stable  Post vital signs: Reviewed and stable  Last Vitals:  Vitals:   05/05/2017 1645 04/28/2017 2028  BP: (!) 168/68 (!) 159/81  Pulse: 92 84  Resp: (!) 24 13  Temp:    SpO2: 95% 94%    Last Pain:  Vitals:   05/06/2017 1303  TempSrc:   PainSc: 7          Complications: No apparent anesthesia complications

## 2017-05-09 NOTE — ED Provider Notes (Addendum)
[facility] DEPT Provider Note   CSN: [id number] Arrival date & time: 04/30/2017  1214     History   Chief Complaint Chief Complaint  Patient presents with  . Weakness    HPI Emma Yu is a 83 y.o. female.  HPI Patient's an 83 year old female with a history of chronic renal insufficiency as well as a history of peripheral arterial disease.  She has bilateral iliac stents and presents the emergency department today because of acute onset severe low back pain and inability to move her legs.  She denies significant abdominal pain but does report new abdominal swelling.  Denies nausea vomiting.  Her pain is severe.  [name] mottled from the waist down.  She presents in atrial fibrillation.  [name] not on chronic anticoagulation.  She takes an 81 mg aspirin daily.   Past Medical History:  Diagnosis Date  . Acute respiratory failure ([place])    12/2011 admission - decreased O2 sats on ambulation, improved by time of discharge  . Aortic stenosis    Moderate by echo 4/13 - mean 20 mmHg  . Arthritis    "back" (08/24/2015)  . Atrial fibrillation ([place])    not a coumadin candidate secondary to fall risk  . CAD (coronary artery disease)    minimal plaque by cath 5/12  . CHF (congestive heart failure) (HCC)    EF 55-60%  . Chronic atrial fibrillation with RVR (HCC)   . Chronic diastolic heart failure (HCC)    Echocardiogram 12/10/11: Moderate LVH, EF 65-70%, moderate aortic stenosis, mean gradient 20, mild MS  . Chronic mid back pain   . DJD (degenerative joint disease) of hip    s/p R THR 10/2010  . DVT (deep venous thrombosis) ([place]) ~ 2012   LLE  . History of blood transfusion 1957   "related to childbirth"  . Hyperlipidemia   . Hypertension   . Insulin pump in place   . Iron deficiency anemia   . Kidney stones ~ 1958   "no OR"  . Migraine    "none since my hysterectomy" (08/24/2015)  . Mitral stenosis    Mild by echo 4/13  . Myocardial infarction ([place]) 12/2010  . PAD  (peripheral artery disease) (HCC)    s/p bilateral comon iliac artery stenting in 2002. Known significant  R SFA  disease. carotid dz noted 11/2011 followed by Dr. Bridgett Larsson  . Skin cancer    right forehead/head  . Type II diabetes mellitus ([place]) dx'd 1999  . Venous insufficiency     Patient Active Problem List   Diagnosis Date Noted  . CHF (congestive heart failure) ([place]) 07/06/2016  . Decubitus ulcer of buttock, stage 1 10/06/2015  . Seasonal allergic rhinitis due to pollen 08/31/2015  . PAD (peripheral artery disease) ([place]) 07/05/2015  . Routine general medical examination at a health care facility 11/08/2014  . Chronic diastolic heart failure ([place]) 09/13/2014  . Chronic atrial fibrillation ([place]) 06/29/2014  . Chronic renal insufficiency, stage III (moderate) 06/16/2014  . Diabetic peripheral neuropathy ([place]) 12/21/2013  . Back pain, chronic 06/18/2013  . Mitral stenosis   . Aortic stenosis-moderate   . CAD-minimal 2012   . Diabetes mellitus type 1 with peripheral artery disease ([place]) 10/22/2011  . Chronic venous insufficiency 11/04/2009  . Hyperlipidemia with target LDL less than 70 08/04/2009  . Essential hypertension, benign 08/04/2009  . Atherosclerotic peripheral vascular disease with ulceration ([place]) 08/04/2009  . Osteoarthritis 08/04/2009    Past Surgical History:  Procedure Laterality  Date  . ABDOMINAL HYSTERECTOMY    . APPENDECTOMY    . BACK SURGERY    . CARDIAC CATHETERIZATION  01/10/2011   No significant CAD  . CATARACT EXTRACTION, BILATERAL Bilateral 2015  . CHOLECYSTECTOMY OPEN    . ILIAC ARTERY STENT Bilateral 2002   [place]  . JOINT REPLACEMENT    . [place] SURGERY  1999  . REVISION TOTAL HIP ARTHROPLASTY  1994  . SKIN CANCER EXCISION  2016   top of right forehead/head  . TONSILLECTOMY    . TOTAL HIP ARTHROPLASTY Right 1991    OB History    No data available       Home Medications    Prior to Admission medications   Medication Sig Start  Date End Date Taking? Authorizing Provider  acetaminophen (TYLENOL) 500 MG tablet Take 500-1,000 mg by mouth every 6 (six) hours as needed (for pain).     [provider]  amLODipine (NORVASC) 5 MG tablet Take 5 mg by mouth daily.    [provider]  aspirin EC 81 MG tablet Take 81 mg by mouth 2 (two) times daily.    [provider]  b complex vitamins tablet Take 1 tablet by mouth daily.      [provider]  calcium-vitamin D (OSCAL WITH D) 500-200 MG-UNIT tablet Take 1 tablet by mouth 2 (two) times daily.    [provider]  Cholecalciferol (VITAMIN D3) 2000 UNITS capsule Take 2,000 Units by mouth daily.      [provider]  Continuous Blood Gluc Sensor (FREESTYLE LIBRE SENSOR SYSTEM) MISC 1 Device by Does not apply route as directed. 1 device every 10 days 10/01/16   Renato Shin, MD  gabapentin (NEURONTIN) 300 MG capsule TAKE 2 CAPSULES BY MOUTH AT BEDTIME 02/21/17   Janith Lima, MD  glucose blood (ONE TOUCH ULTRA TEST) test strip 1 each by Other route 4 (four) times daily. And lancets 4/day 01/10/17   Renato Shin, MD  insulin lispro (HUMALOG) 100 UNIT/ML injection Inject 5 Units into the skin 3 (three) times daily before meals.     [provider]  levocetirizine (XYZAL) 5 MG tablet Take 1 tablet (5 mg total) by mouth every evening. 02/26/17   Janith Lima, MD  metoprolol tartrate (LOPRESSOR) 100 MG tablet Take 1 tablet (100 mg total) by mouth 2 (two) times daily. 03/25/17   Josue Hector, MD  omeprazole (PRILOSEC) 40 MG capsule TAKE 1 CAPSULE BY MOUTH  DAILY BEFORE BREAKFAST 11/21/16   Janith Lima, MD  potassium chloride SA (K-DUR,KLOR-CON) 20 MEQ tablet Take 20 mEq by mouth daily.    [provider]  pravastatin (PRAVACHOL) 20 MG tablet Take 20 mg by mouth daily.    [provider]  Probiotic Product (PROBIOTIC PO) Take 1 tablet by mouth daily at 10 pm.    [provider]  torsemide (DEMADEX) 20  MG tablet Take 1 tablet (20 mg total) by mouth 2 (two) times daily. 01/03/17   Wellington [place], MD  traMADol (ULTRAM) 50 MG tablet TAKE 2 TABLETS BY MOUTH EVERY 6 HOURS AS NEEDED FOR PAIN 03/25/17   Janith Lima, MD    Family History Family History  Problem Relation Age of Onset  . Diabetes Father   . Diabetes Other        5/8 sibs    Social History Social History  Substance Use Topics  . Smoking status: Former Smoker  Packs/day: 2.00    Years: 37.00    Types: Cigarettes    Quit date: 08/20/1985  . Smokeless tobacco: Never Used  . Alcohol use No     Allergies   Carvedilol; Augmentin [amoxicillin-pot clavulanate]; Clindamycin/lincomycin; Diltiazem hcl; Fenofibrate; Hctz [hydrochlorothiazide]; Nisoldipine; Nitroglycerin; Propranolol; Tekturna [aliskiren fumarate]; Valturna [aliskiren-valsartan]; Septra [sulfamethoxazole-trimethoprim]; and Sulfa antibiotics   Review of Systems Review of Systems  All other systems reviewed and are negative.    Physical Exam Updated Vital Signs BP (!) 170/138 (BP Location: Right Arm)   Pulse (!) 120   Temp 98.6 F (37 C) (Oral)   Resp 20   LMP  (LMP Unknown)   SpO2 90%   Physical Exam  Constitutional: [name] oriented to person, place, and time. She appears well-developed and well-nourished. No distress.  HENT:  Head: Normocephalic and atraumatic.  Eyes: EOM are normal.  Neck: Normal range of motion.  Cardiovascular: Normal rate, regular rhythm and normal heart sounds.   Pulmonary/Chest: Effort normal and breath sounds normal.  Abdominal: Soft. She exhibits no distension. There is no tenderness.  Musculoskeletal:  Full range of motion bilateral hips, knees, feet.  Unable to move lower extremities.  Absent femoral, popliteal, posterior tibialis, dorsalis pedis bilaterally.  Legs are pale  Neurological: [name] alert and oriented to person, place, and time.  Skin: Skin is warm and dry.  Psychiatric: She has a normal mood and affect.  Judgment normal.  Nursing note and vitals reviewed.    ED Treatments / Results  Labs (all labs ordered are listed, but only abnormal results are displayed) Labs Reviewed  CBC - Abnormal; Notable for the following:       Result Value   WBC 14.6 (*)    Hemoglobin 15.4 (*)    HCT 48.1 (*)    RDW 15.9 (*)    All other components within normal limits  COMPREHENSIVE METABOLIC PANEL - Abnormal; Notable for the following:    Chloride 99 (*)    CO2 21 (*)    Glucose, Bld 460 (*)    BUN 46 (*)    Creatinine, Ser 1.49 (*)    Total Protein 9.6 (*)    AST 61 (*)    Alkaline Phosphatase 157 (*)    GFR calc non Af Amer 30 (*)    GFR calc Af Amer 35 (*)    Anion gap 18 (*)    All other components within normal limits  I-STAT CHEM 8, ED - Abnormal; Notable for the following:    BUN 51 (*)    Creatinine, Ser 1.30 (*)    Glucose, Bld 488 (*)    Calcium, Ion 1.13 (*)    Hemoglobin 18.0 (*)    HCT 53.0 (*)    All other components within normal limits  I-STAT CG4 LACTIC ACID, ED - Abnormal; Notable for the following:    Lactic Acid, Venous 6.25 (*)    All other components within normal limits  CBG MONITORING, ED - Abnormal; Notable for the following:    Glucose-Capillary 480 (*)    All other components within normal limits  TROPONIN I  TYPE AND SCREEN    EKG  EKG Interpretation  Date/Time:  Thursday May 09 2017 12:24:56 EDT Ventricular Rate:  119 PR Interval:    QRS Duration: 119 QT Interval:  365 QTC Calculation: 514 R Axis:   -95 Text Interpretation:  Atrial fibrillation Left anterior fascicular block Anterolateral infarct, old No significant change was found Confirmed by [place],  Lennette Bihari [phone]) on 05/08/2017 3:45:23 PM       Radiology No results found.  Procedures .Critical Care Performed by: Jola Schmidt Authorized by: Jola Schmidt      Total critical care time: 45 minutes Critical care time was exclusive of separately billable procedures and treating other  patients. Critical care was necessary to treat or prevent imminent or life-threatening deterioration. Critical care was time spent personally by me on the following activities: development of treatment plan with patient and/or surrogate as well as nursing, discussions with consultants, evaluation of patient's response to treatment, examination of patient, obtaining history from patient or surrogate, ordering and performing treatments and interventions, ordering and review of laboratory studies, ordering and review of radiographic studies, pulse oximetry and re-evaluation of patient's condition.  Medications Ordered in ED Medications  dextrose 5 %-0.45 % sodium chloride infusion (not administered)  insulin regular (NOVOLIN R,HUMULIN R) 100 Units in sodium chloride 0.9 % 100 mL (1 Units/mL) infusion (7.4 Units/hr Intravenous Rate/Dose Change 05/05/2017 1544)  fentaNYL (SUBLIMAZE) injection 100 mcg (100 mcg Intravenous Given 04/28/2017 1241)  fentaNYL (SUBLIMAZE) injection 100 mcg (100 mcg Intravenous Given 05/17/2017 1251)  fentaNYL (SUBLIMAZE) injection 100 mcg (100 mcg Intravenous Given 05/17/2017 1403)  iopamidol (ISOVUE-370) 76 % injection (80 mLs Intravenous Contrast Given 05/08/2017 1518)  ondansetron (ZOFRAN) injection 4 mg (4 mg Intravenous Given 05/07/2017 1336)     Initial Impression / Assessment and Plan / ED Course  I have reviewed the triage vital signs and the nursing notes.  Pertinent labs & imaging results that were available during my care of the patient were reviewed by me and considered in my medical decision making (see chart for details).    Patient was severe back pain and abdominal distention.  She has absent pulses in her lower extremities and this likely represents acute aortic occlusion however given the severity of her discomfort and pain CT scan with IV contrast from the chest through her legs is necessary to further delineate what and where the issue is.  Informed consent given by both  patient and son for use of IV contrast despite her renal insufficiency.  They understand this as the likelihood of worsening her kidney function but in this case I think the benefit outweighs the risk given her severe presentation   3:40 PM Patient does have evidence of acute thrombus just below the level of her IMA with occlusion of her bilateral iliac arteries.  Patient started on heparin at time of reading discussion with radiology.  Patient family updated.  Page place to vascular surgery for vascular emergency.  Patient and family updated   Final Clinical Impressions(s) / ED Diagnoses   Final diagnoses:  Aortic occlusion [facility])    New Prescriptions New Prescriptions   No medications on file     Jola Schmidt, MD 05/06/2017 1541    Jola Schmidt, MD 04/29/2017 1542    Jola Schmidt, MD 05/16/2017 [phone]

## 2017-05-09 NOTE — Anesthesia Preprocedure Evaluation (Addendum)
Anesthesia Evaluation  Patient identified by MRN, date of birth, ID band Patient awake    Reviewed: Allergy & Precautions, NPO status , Patient's Chart, lab work & pertinent test results  Airway Mallampati: II  TM Distance: >3 FB     Dental  (+) Edentulous Upper, Edentulous Lower   Pulmonary former smoker,    breath sounds clear to auscultation       Cardiovascular hypertension,  Rhythm:Irregular Rate:Normal     Neuro/Psych    GI/Hepatic   Endo/Other  diabetes  Renal/GU      Musculoskeletal   Abdominal   Peds  Hematology   Anesthesia Other Findings Mottled LEs below umbilicus  Reproductive/Obstetrics                             Anesthesia Physical Anesthesia Plan  ASA: IV and emergent  Anesthesia Plan: General   Post-op Pain Management:    Induction: Intravenous  PONV Risk Score and Plan: 3 and Ondansetron and Treatment may vary due to age or medical condition  Airway Management Planned: Oral ETT  Additional Equipment: Arterial line, CVP and Ultrasound Guidance Line Placement  Intra-op Plan:   Post-operative Plan: Extubation in OR and Possible Post-op intubation/ventilation  Informed Consent: I have reviewed the patients History and Physical, chart, labs and discussed the procedure including the risks, benefits and alternatives for the proposed anesthesia with the patient or authorized representative who has indicated his/her understanding and acceptance.     Plan Discussed with: CRNA and Surgeon  Anesthesia Plan Comments:         Anesthesia Quick Evaluation

## 2017-05-09 NOTE — ED Notes (Signed)
Pt to scanner; diabetes coordinator at bedside.

## 2017-05-09 NOTE — ED Notes (Signed)
PT PRESENTS WITH INSULIN PUMP; SON TURNED OFF AND GIVEN TO HIM. MD AWARE.

## 2017-05-09 NOTE — Op Note (Addendum)
Patient name: Emma Yu MRN: [id number] DOB: 07-14-1927 Sex: female  05/13/2017 Pre-operative Diagnosis: acute aortic occlusion Post-operative diagnosis:  Same Surgeon:  Erlene Quan C. Donzetta Matters, MD Assistants: Adele Barthel, MD  Gerri Lins, PA Procedure Performed: 1.  Exposure of bilateral common femoral arteries 2.  Aortoiliac thromboembolectomy 3.  Aortogram  Indications:  83 year old female with history of bilateral common iliac artery stents and also has history of atrial fibrillation and is not on anticoagulation. She has a today history of bilateral lower extremity pain and weakness and was found on CT angiogram to have an occluded aorta that appeared acute. [name] therefore indicated for embolectomy and possible bypass and possible lower extremity intervention.   Findings: The bilateral common femoral arteries were calcified and the left greater than the right. There was no thrombus within these arteries and there was backbleeding from the profunda femoris and superficial femoral arteries bilaterally. We did retrieve significant clot proximally from the aortoiliac segments and then had brisk inflow bilaterally with palpable femoral pulses. Aortogram demonstrated calcification of the distal aorta but the stents appeared patent and there was runoff to the groins and the profundus and proximal superficial femoral arteries also ran off briskly did not have flow-limiting stenosis. At completion there were no signals at the ankles but she did have capillary refill less than 2 seconds bilateral feet.   Procedure:  The patient was identified in the holding area and taken to  the operating room where she was placed supine on the operating table and general anesthesia was induced. She was given antibiotics daily and sterilely prepped and draped on the chest abdomen bilateral lower extremities and time out called. We began with multiple incision in the right groin dissection down on the common  femoral artery. There was noted be no bleeding in the groin no positive patient in the artery. We placed as well as runny external iliac, profunda femoris and superficial femoral arteries. A similar procedure was undertaken on the left serologic data incision there was also noted to be no bleeding and replace Vesseloops around the similar arteries. Left-sided has significant calcifications more cephalad and there was a previous closure device on the left was removed and did leave an arteriotomy was closed with 5-0 Prolene. We did give the patient additional 5000 units of heparin though she was previously on heparin drip. We then opened the bilateral common femoral arteries transversely and did have backbleeding and there was no acute thrombus identified. We clamped our superficial femoral and profunda femoris arteries bilaterally. We then performed thromboembolectomy first from the right with 4 fogarty and established brisk inflow and the external iliac artery was clamped. On the leftwe did struggle initially with a 4 and we had to go up initially to 5 fogarty and then ultimately used a 3 for fogarty.  Ultimately we were able to pass a 4 fogarty and retrieved significant thrombus and had brisk inflow. We backled our runoff vessels and then flushed with heparinized saline and closed arteriotomies with 5-0 Prolene suture. The white count femoral artery was then cannulated with 18-gauge needle and we placed the [facility] wire a 5 [place] sheath. Using a bare seen catheter we directed the [facility] into the aorta and exchanged for an arm the first catheter. Aortogram was performed demonstrating calcification of the aorta but patent common iliac artery stents and a patent left hypogastric artery although the right was occluded. We did also perform a second injection which demonstrated runoff via both profunda femoris  and superficial femoral arteries. Satisfied with this we will remove the sheath includes the arteriotomy was 5-0  Prolene suture. We then irrigated groins and obtain hemostasis. There were closed in layers with 2-0 Vicryl followed by 3-0 Vicryl and 4-0 Monocryl suture and Dermabond placed about that. We do not have any Doppler signals to the feet did have bilateral capillary refill in the calf muscles were soft and given that she had chronic disease no thrombus within the common femoral arteries wheezing she did have some blood getting to her legs preoperative and we were at 7 hours timeframe of ischemia as well as perform arteriotomies. We will follow her CPKs closely as well as her physical exam. Patient was extubated and tolerated procedure well without immediate complication.  Blood loss 300 mL.  Contrast 40 mL.      Reilly Blades C. Donzetta Matters, MD [facility] Office: [phone] Pager: [phone]

## 2017-05-09 NOTE — ED Notes (Signed)
Got pastient undress on the monitor did ekg shown to Dr Venora Maples patient is resting with cal bell in reach

## 2017-05-09 NOTE — ED Notes (Signed)
Dentures and earrings and other belongings including purse with son

## 2017-05-09 NOTE — ED Notes (Signed)
PT's pain is not improved at all; MD at bedside; pouring sweat and gripping side rail

## 2017-05-09 NOTE — Anesthesia Procedure Notes (Signed)
Procedure Name: Intubation Date/Time: 04/26/2017 6:01 PM Performed by: Ollen Bowl Pre-anesthesia Checklist: Patient identified, Emergency Drugs available, Suction available, Patient being monitored and Timeout performed Patient Re-evaluated:Patient Re-evaluated prior to induction Oxygen Delivery Method: Circle system utilized Preoxygenation: Pre-oxygenation with 100% oxygen Induction Type: IV induction Ventilation: Mask ventilation without difficulty Laryngoscope Size: Mac and 4 Grade View: Grade I Tube type: Subglottic suction tube Tube size: 7.5 mm Airway Equipment and Method: Stylet Placement Confirmation: ETT inserted through vocal cords under direct vision,  positive ETCO2 and breath sounds checked- equal and bilateral Secured at: 21 cm Tube secured with: Tape Dental Injury: Teeth and Oropharynx as per pre-operative assessment

## 2017-05-09 NOTE — ED Triage Notes (Addendum)
Pt from home; called for fall. EMS found on toilet; states got up this morning and everything ok. States both legs gave out ad unable to move. Belly distended but unable to tell me if new; has insulin pump running; legs bluish with weak pulses on feet bilaterally but son reports she has stents. Crying "please help me! Please give me something for pain" stating back hurting. Gripping side rail and diaphoretic. MD at bedside.

## 2017-05-09 NOTE — ED Notes (Signed)
Pt placed on nonrebreather; has been on 4 L [place] and not maintaining sats. MD aware.

## 2017-05-09 NOTE — Anesthesia Procedure Notes (Signed)
Arterial Line Insertion Start/End09/12/2016 5:45 PM, 05/09/2017 5:40 PM Performed by: Shirlyn Goltz, CRNA  Preanesthetic checklist: patient identified, IV checked, site marked, risks and benefits discussed, surgical consent, monitors and equipment checked, pre-op evaluation and timeout performed Lidocaine 1% used for infiltration and patient sedated Left, radial was placed Catheter size: 20 G  Attempts: 1 Procedure performed without using ultrasound guided technique. Following insertion, Biopatch and dressing applied. Post procedure assessment: normal  Patient tolerated the procedure well with no immediate complications.

## 2017-05-09 NOTE — ED Notes (Signed)
Took patient blood sugar it was 480 notified RN Benjamine Mola of blood sugar

## 2017-05-09 NOTE — ED Notes (Signed)
MD at bedside talking to son and patient about CT results.

## 2017-05-09 NOTE — Progress Notes (Signed)
Inpatient Diabetes Program Recommendations  AACE/ADA: New Consensus Statement on Inpatient Glycemic Control (2015)  Target Ranges:  Prepandial:   less than 140 mg/dL      Peak postprandial:   less than 180 mg/dL (1-2 hours)      Critically ill patients:  140 - 180 mg/dL   Lab Results  Component Value Date   GLUCAP 480 (H) 05/12/2017   HGBA1C 7.2 01/10/2017   Review of Glycemic Control  Diabetes history: DM 2 although son states she is more like a type 1 now Outpatient Diabetes medications: Humalog Insulin Pump Current orders for Inpatient glycemic control: IV insulin  Inpatient Diabetes Program Recommendations:    Patient not appropriate to operate insulin pump. Patient taken off of IV insulin pump by son. Patient does NOT have a continuous glucose monitor on. Spoke with RN and Dr. Venora Maples about plan of care for patient. Dr. Venora Maples ordering IV insulin. Glucose 400's.  Insulin pump rates  Basal rates: 12A-12A 0.9 units/hour Patient usually does not have to correct her glucose levels but uses 5 units of meal coverage at each meal.   Patient usually needs assistance to place pump on herself and switch sites. She also needs assistance filling the reservoir because of her vision.  Thanks,  Tama Headings RN, MSN, [facility] Inpatient Diabetes Coordinator Team Pager [phone] (8a-5p)

## 2017-05-09 NOTE — Anesthesia Procedure Notes (Signed)
Central Venous Catheter Insertion Performed by: Roberts Gaudy, anesthesiologist Start/End09/26/2018 6:00 PM, 05/09/2017 6:10 AM Patient location: OR. Preanesthetic checklist: patient identified, IV checked, site marked, risks and benefits discussed, surgical consent, monitors and equipment checked, pre-op evaluation and timeout performed Position: supine Hand hygiene performed , maximum sterile barriers used  and Seldinger technique used Catheter size: 12 Fr Central line was placed.Triple lumen Procedure performed using ultrasound guided technique. Ultrasound Notes:anatomy identified, needle tip was noted to be adjacent to the nerve/plexus identified, no ultrasound evidence of intravascular and/or intraneural injection and image(s) printed for medical record Attempts: 1 Following insertion, line sutured, dressing applied and Biopatch. Post procedure assessment: blood return through all ports and free fluid flow  Patient tolerated the procedure well with no immediate complications.

## 2017-05-09 NOTE — Progress Notes (Signed)
Abd is distended, no n/v: uo is pink tinged from OR, she wiggles her toes when asked, but says she can't feel her feet. Dr Donzetta Matters here & aware. Will cont to monitor. Family in to see pt & fully updated.

## 2017-05-09 NOTE — ED Notes (Signed)
Pt on 2 LNC; sats 90%. Bumped to 4L [place].

## 2017-05-09 NOTE — ED Notes (Addendum)
Pt cannot move legs bilaterally. States pain is terrible; sats on 4 L [place] high 80s

## 2017-05-09 NOTE — Progress Notes (Signed)
ANTICOAGULATION CONSULT NOTE - Initial Consult  Pharmacy Consult for heparin Indication: acute aortic occlusion  Allergies  Allergen Reactions  . Carvedilol Other (See Comments)    Heart stops CARDIAC ARREST  . Augmentin [Amoxicillin-Pot Clavulanate] Diarrhea  . Clindamycin/Lincomycin Other (See Comments)    tremors  . Diltiazem Hcl Other (See Comments)    Chest pain;   07/02/14-  Patient has been able to tolerate diltiazem PO as inpatient since 06/29/14 without any adverse reaction.  . Fenofibrate Other (See Comments)    Unknown - [facility] - MAR  . Hctz [Hydrochlorothiazide] Other (See Comments)    Chest pain  . Nisoldipine Other (See Comments)    Chest pain  . Nitroglycerin Other (See Comments)    Unknown - [facility] MAR  . Propranolol Diarrhea    Chest pain  . Tekturna [Aliskiren Fumarate] Other (See Comments)    Unknown reaction  . Valturna [Aliskiren-Valsartan] Other (See Comments)    Unknown reaction  . Septra [Sulfamethoxazole-Trimethoprim] Rash    Rash, cyst  . Sulfa Antibiotics Rash    Patient Measurements: Weight: 140 lb (63.5 kg) Heparin Dosing Weight: 63.5 kg  Vital Signs: Temp: 98.6 F (37 C) (09/20 1229) Temp Source: Oral (09/20 1229) BP: 180/85 (09/20 1530) Pulse Rate: 103 (09/20 1530)  Labs:  Recent Labs  05/02/2017 1233 04/23/2017 1255  HGB 15.4* 18.0*  HCT 48.1* 53.0*  PLT 207  --   CREATININE 1.49* 1.30*  TROPONINI <0.03  --     Estimated Creatinine Clearance: 26.3 mL/min (A) (by C-G formula based on SCr of 1.3 mg/dL (H)).   Medical History: Past Medical History:  Diagnosis Date  . Acute respiratory failure ([place])    12/2011 admission - decreased O2 sats on ambulation, improved by time of discharge  . Aortic stenosis    Moderate by echo 4/13 - mean 20 mmHg  . Arthritis    "back" (08/24/2015)  . Atrial fibrillation ([place])    not a coumadin candidate secondary to fall risk  . CAD (coronary artery disease)    minimal plaque by cath 5/12  . CHF  (congestive heart failure) (HCC)    EF 55-60%  . Chronic atrial fibrillation with RVR (HCC)   . Chronic diastolic heart failure (H[facility])    Echocardiogram 12/10/11: Moderate LVH, EF 65-70%, moderate aortic stenosis, mean gradient 20, mild MS  . Chronic mid back pain   . DJD (degenerative joint disease) of hip    s/p R THR 10/2010  . DVT (deep venous thrombosis) ([place]) ~ 2012   LLE  . History of blood transfusion 1957   "related to childbirth"  . Hyperlipidemia   . Hypertension   . Insulin pump in place   . Iron deficiency anemia   . Kidney stones ~ 1958   "no OR"  . Migraine    "none since my hysterectomy" (08/24/2015)  . Mitral stenosis    Mild by echo 4/13  . Myocardial infarction ([place]) 12/2010  . PAD (peripheral artery disease) (H[facility])    s/p bilateral comon iliac artery stenting in 2002. Known significant  R SFA  disease. carotid dz noted 11/2011 followed by Dr. Bridgett Larsson  . Skin cancer    right forehead/head  . Type II diabetes mellitus ([place]) dx'd 1999  . Venous insufficiency     Assessment: 83 yo female presented with CTA of chest showed acute occlusion of abdominal aorta. To start heparin infusion. hgb 18, plts wnl.  Goal of Therapy:  Heparin level 0.3-0.7 units/ml Monitor  platelets by anticoagulation protocol: Yes    Plan:  -Heparin bolus 3800 units x1 then 1000 units/hr -Daily HL, CBC    Emma Yu, Jake Church 05/02/2017,3:55 PM

## 2017-05-10 ENCOUNTER — Inpatient Hospital Stay (HOSPITAL_COMMUNITY): Payer: Medicare Other

## 2017-05-10 DIAGNOSIS — Z9889 Other specified postprocedural states: Secondary | ICD-10-CM

## 2017-05-10 DIAGNOSIS — I351 Nonrheumatic aortic (valve) insufficiency: Secondary | ICD-10-CM

## 2017-05-10 DIAGNOSIS — J9601 Acute respiratory failure with hypoxia: Secondary | ICD-10-CM

## 2017-05-10 DIAGNOSIS — I469 Cardiac arrest, cause unspecified: Secondary | ICD-10-CM

## 2017-05-10 DIAGNOSIS — Z978 Presence of other specified devices: Secondary | ICD-10-CM

## 2017-05-10 LAB — BLOOD GAS, ARTERIAL
Acid-base deficit: 2.1 mmol/L — ABNORMAL HIGH (ref 0.0–2.0)
BICARBONATE: 25.2 mmol/L (ref 20.0–28.0)
FIO2: 100
LHR: 12 {breaths}/min
MECHVT: 440 mL
O2 Saturation: 99.2 %
PEEP: 10 cmH2O
PO2 ART: 300 mmHg — AB (ref 83.0–108.0)
Patient temperature: 98.6
pCO2 arterial: 69.3 mmHg (ref 32.0–48.0)
pH, Arterial: 7.185 — CL (ref 7.350–7.450)

## 2017-05-10 LAB — CK
CK TOTAL: 14950 U/L — AB (ref 38–234)
CK TOTAL: 15691 U/L — AB (ref 38–234)
Total CK: 20634 U/L — ABNORMAL HIGH (ref 38–234)

## 2017-05-10 LAB — ECHOCARDIOGRAM COMPLETE
AO mean calculated velocity dopler: 131 cm/s
AOPV: 0.7 m/s
AV Area VTI index: 0.89 cm2/m2
AV Area mean vel: 1.4 cm2
AV Mean grad: 9 mmHg
AV VEL mean LVOT/AV: 0.62
AV area mean vel ind: 0.83 cm2/m2
AV pk vel: 221 cm/s
AVAREAVTI: 1.58 cm2
AVPG: 20 mmHg
AVPHT: 503 ms
Area-P 1/2: 2 cm2
CHL CUP AV PEAK INDEX: 0.94
CHL CUP AV VALUE AREA INDEX: 0.89
CHL CUP AV VEL: 1.49
CHL CUP LVOT MV VTI INDEX: 0.8 cm2/m2
CHL CUP LVOT MV VTI: 1.34
CHL CUP MV M VEL: 63
FS: 23 % — AB (ref 28–44)
HEIGHTINCHES: 64 in
IVS/LV PW RATIO, ED: 0.93
LA ID, A-P, ES: 35 mm
LA diam end sys: 35 mm
LA diam index: 2.08 cm/m2
LAVOLA4C: 29.6 mL
LDCA: 2.27 cm2
LVOT SV: 45 mL
LVOT VTI: 19.9 cm
LVOT diameter: 17 mm
LVOT peak grad rest: 9 mmHg
LVOTPV: 154 cm/s
LVOTVTI: 0.65 cm
MVANNULUSVTI: 33.8 cm
Mean grad: 2 mmHg
P 1/2 time: 91 ms
PW: 14 mm — AB (ref 0.6–1.1)
VTI: 30.4 cm
Valve area: 1.49 cm2
WEIGHTICAEL: 2239.87 [oz_av]

## 2017-05-10 LAB — BASIC METABOLIC PANEL
Anion gap: 14 (ref 5–15)
Anion gap: 14 (ref 5–15)
BUN: 49 mg/dL — ABNORMAL HIGH (ref 6–20)
BUN: 54 mg/dL — AB (ref 6–20)
CALCIUM: 8 mg/dL — AB (ref 8.9–10.3)
CHLORIDE: 103 mmol/L (ref 101–111)
CO2: 20 mmol/L — ABNORMAL LOW (ref 22–32)
CO2: 23 mmol/L (ref 22–32)
CREATININE: 2.11 mg/dL — AB (ref 0.44–1.00)
CREATININE: 1.43 mg/dL — AB (ref 0.44–1.00)
Calcium: 9.1 mg/dL (ref 8.9–10.3)
Chloride: 105 mmol/L (ref 101–111)
GFR calc non Af Amer: 32 mL/min — ABNORMAL LOW (ref >60)
GFR, EST AFRICAN AMERICAN: 23 mL/min — AB (ref >60)
GFR, EST AFRICAN AMERICAN: 37 mL/min — AB (ref >60)
GFR, EST NON AFRICAN AMERICAN: 20 mL/min — AB (ref >60)
Glucose, Bld: 129 mg/dL — ABNORMAL HIGH (ref 65–99)
Glucose, Bld: 321 mg/dL — ABNORMAL HIGH (ref 65–99)
Potassium: 4.5 mmol/L (ref 3.5–5.1)
Potassium: 5.9 mmol/L — ABNORMAL HIGH (ref 3.5–5.1)
SODIUM: 139 mmol/L (ref 135–145)
Sodium: 140 mmol/L (ref 135–145)

## 2017-05-10 LAB — CBC
HEMATOCRIT: 46.6 % — AB (ref 36.0–46.0)
HEMOGLOBIN: 14.6 g/dL (ref 12.0–15.0)
MCH: 30.7 pg (ref 26.0–34.0)
MCHC: 31.3 g/dL (ref 30.0–36.0)
MCV: 97.9 fL (ref 78.0–100.0)
Platelets: 167 10*3/uL (ref 150–400)
RBC: 4.76 MIL/uL (ref 3.87–5.11)
RDW: 16.4 % — ABNORMAL HIGH (ref 11.5–15.5)
WBC: 17.7 10*3/uL — ABNORMAL HIGH (ref 4.0–10.5)

## 2017-05-10 LAB — GLUCOSE, CAPILLARY
GLUCOSE-CAPILLARY: 101 mg/dL — AB (ref 65–99)
GLUCOSE-CAPILLARY: 133 mg/dL — AB (ref 65–99)
GLUCOSE-CAPILLARY: 139 mg/dL — AB (ref 65–99)
GLUCOSE-CAPILLARY: 139 mg/dL — AB (ref 65–99)
GLUCOSE-CAPILLARY: 189 mg/dL — AB (ref 65–99)
GLUCOSE-CAPILLARY: 293 mg/dL — AB (ref 65–99)
GLUCOSE-CAPILLARY: 89 mg/dL (ref 65–99)
GLUCOSE-CAPILLARY: 90 mg/dL (ref 65–99)
Glucose-Capillary: 127 mg/dL — ABNORMAL HIGH (ref 65–99)
Glucose-Capillary: 113 mg/dL — ABNORMAL HIGH (ref 65–99)
Glucose-Capillary: 105 mg/dL — ABNORMAL HIGH (ref 65–99)
Glucose-Capillary: 139 mg/dL — ABNORMAL HIGH (ref 65–99)
Glucose-Capillary: 280 mg/dL — ABNORMAL HIGH (ref 65–99)
Glucose-Capillary: 263 mg/dL — ABNORMAL HIGH (ref 65–99)
Glucose-Capillary: 220 mg/dL — ABNORMAL HIGH (ref 65–99)
Glucose-Capillary: 186 mg/dL — ABNORMAL HIGH (ref 65–99)
Glucose-Capillary: 105 mg/dL — ABNORMAL HIGH (ref 65–99)
Glucose-Capillary: 90 mg/dL (ref 65–99)
Glucose-Capillary: 91 mg/dL (ref 65–99)
Glucose-Capillary: 106 mg/dL — ABNORMAL HIGH (ref 65–99)
Glucose-Capillary: 153 mg/dL — ABNORMAL HIGH (ref 65–99)

## 2017-05-10 LAB — URINALYSIS, ROUTINE W REFLEX MICROSCOPIC
BILIRUBIN URINE: NEGATIVE
Glucose, UA: 50 mg/dL — AB
KETONES UR: NEGATIVE mg/dL
LEUKOCYTES UA: NEGATIVE
NITRITE: NEGATIVE
Specific Gravity, Urine: 1.04 — ABNORMAL HIGH (ref 1.005–1.030)
pH: 6 (ref 5.0–8.0)

## 2017-05-10 LAB — POCT I-STAT 3, ART BLOOD GAS (G3+)
ACID-BASE DEFICIT: 5 mmol/L — AB (ref 0.0–2.0)
Acid-Base Excess: 4 mmol/L — ABNORMAL HIGH (ref 0.0–2.0)
BICARBONATE: 23.1 mmol/L (ref 20.0–28.0)
Bicarbonate: 28.1 mmol/L — ABNORMAL HIGH (ref 20.0–28.0)
O2 SAT: 99 %
O2 Saturation: 99 %
PCO2 ART: 40 mmHg (ref 32.0–48.0)
PH ART: 7.452 — AB (ref 7.350–7.450)
PO2 ART: 190 mmHg — AB (ref 83.0–108.0)
Patient temperature: 97.6
Patient temperature: 98.3
TCO2: 29 mmol/L (ref 22–32)
TCO2: 25 mmol/L (ref 22–32)
pCO2 arterial: 52.9 mmHg — ABNORMAL HIGH (ref 32.0–48.0)
pH, Arterial: 7.247 — ABNORMAL LOW (ref 7.350–7.450)
pO2, Arterial: 136 mmHg — ABNORMAL HIGH (ref 83.0–108.0)

## 2017-05-10 LAB — MAGNESIUM: MAGNESIUM: 2.5 mg/dL — AB (ref 1.7–2.4)

## 2017-05-10 LAB — HEMOGLOBIN AND HEMATOCRIT, BLOOD
HCT: 42 % (ref 36.0–46.0)
Hemoglobin: 13.3 g/dL (ref 12.0–15.0)

## 2017-05-10 LAB — PROTIME-INR
INR: 1.47
Prothrombin Time: 17.7 seconds — ABNORMAL HIGH (ref 11.4–15.2)

## 2017-05-10 LAB — TROPONIN I
TROPONIN I: 0.27 ng/mL — AB (ref <0.03)
TROPONIN I: 0.67 ng/mL — AB (ref <0.03)
TROPONIN I: 0.96 ng/mL — AB (ref <0.03)

## 2017-05-10 LAB — TRIGLYCERIDES: Triglycerides: 141 mg/dL (ref <150)

## 2017-05-10 LAB — LACTIC ACID, PLASMA
LACTIC ACID, VENOUS: 4.6 mmol/L — AB (ref 0.5–1.9)
Lactic Acid, Venous: 8.2 mmol/L (ref 0.5–1.9)

## 2017-05-10 LAB — FIBRINOGEN: FIBRINOGEN: 393 mg/dL (ref 210–475)

## 2017-05-10 LAB — PHOSPHORUS: PHOSPHORUS: 6.4 mg/dL — AB (ref 2.5–4.6)

## 2017-05-10 MED ORDER — SODIUM CHLORIDE 0.9 % IV SOLN
8.0000 mg/h | INTRAVENOUS | Status: DC
  Administered 2017-05-10 – 2017-05-11 (×3): 8 mg/h via INTRAVENOUS
  Filled 2017-05-10 (×7): qty 80

## 2017-05-10 MED ORDER — ROCURONIUM BROMIDE 50 MG/5ML IV SOLN
2.0000 mg/kg | Freq: Once | INTRAVENOUS | Status: AC
  Administered 2017-05-10: 109.4 mg via INTRAVENOUS
  Filled 2017-05-10: qty 10.94

## 2017-05-10 MED ORDER — DEXTROSE 5 % IV SOLN
1.0000 g | INTRAVENOUS | Status: DC
  Administered 2017-05-10 – 2017-05-11 (×2): 1 g via INTRAVENOUS
  Filled 2017-05-10 (×2): qty 1

## 2017-05-10 MED ORDER — VANCOMYCIN HCL IN DEXTROSE 1-5 GM/200ML-% IV SOLN
1000.0000 mg | INTRAVENOUS | Status: DC
  Administered 2017-05-10: 1000 mg via INTRAVENOUS
  Filled 2017-05-10: qty 200

## 2017-05-10 MED ORDER — EPINEPHRINE PF 1 MG/ML IJ SOLN
0.5000 ug/min | INTRAVENOUS | Status: AC
  Administered 2017-05-11: 0.5 ug/min via INTRAVENOUS
  Filled 2017-05-10 (×3): qty 4

## 2017-05-10 MED ORDER — PANTOPRAZOLE SODIUM 40 MG IV SOLR: 40.0000 mg | Freq: Two times a day (BID) | INTRAVENOUS | Status: DC

## 2017-05-10 MED ORDER — DOCUSATE SODIUM 50 MG/5ML PO LIQD: 100.0000 mg | Freq: Two times a day (BID) | ORAL | Status: DC | PRN

## 2017-05-10 MED ORDER — DEXTROSE-NACL 5-0.45 % IV SOLN
INTRAVENOUS | Status: DC
  Administered 2017-05-10: 13:00:00 via INTRAVENOUS

## 2017-05-10 MED ORDER — SODIUM CHLORIDE 0.9 % IV SOLN
INTRAVENOUS | Status: DC
  Administered 2017-05-10 – 2017-05-11 (×3): via INTRAVENOUS

## 2017-05-10 MED ORDER — BISACODYL 10 MG RE SUPP: 10.0000 mg | Freq: Every day | RECTAL | Status: DC | PRN

## 2017-05-10 MED ORDER — INSULIN REGULAR BOLUS VIA INFUSION
0.0000 [IU] | Freq: Three times a day (TID) | INTRAVENOUS | Status: DC
  Filled 2017-05-10: qty 10

## 2017-05-10 MED ORDER — FENTANYL CITRATE (PF) 100 MCG/2ML IJ SOLN
50.0000 ug | INTRAMUSCULAR | Status: DC | PRN
  Administered 2017-05-10 – 2017-05-11 (×4): 50 ug via INTRAVENOUS
  Filled 2017-05-10 (×4): qty 2

## 2017-05-10 MED ORDER — SODIUM CHLORIDE 0.9 % IV SOLN
INTRAVENOUS | Status: DC
  Filled 2017-05-10: qty 1

## 2017-05-10 MED ORDER — SODIUM CHLORIDE 0.9 % IV SOLN
80.0000 mg | Freq: Once | INTRAVENOUS | Status: AC
  Administered 2017-05-10: 80 mg via INTRAVENOUS
  Filled 2017-05-10: qty 80

## 2017-05-10 MED ORDER — ORAL CARE MOUTH RINSE: 15.0000 mL | Freq: Two times a day (BID) | OROMUCOSAL | Status: DC

## 2017-05-10 MED ORDER — SODIUM CHLORIDE 0.9 % IV BOLUS (SEPSIS)
1000.0000 mL | Freq: Once | INTRAVENOUS | Status: AC
  Administered 2017-05-10: 1000 mL via INTRAVENOUS

## 2017-05-10 MED ORDER — MIDAZOLAM HCL 2 MG/2ML IJ SOLN
4.0000 mg | Freq: Once | INTRAMUSCULAR | Status: AC
  Administered 2017-05-10: 4 mg via INTRAVENOUS

## 2017-05-10 MED ORDER — DEXTROSE 5 % IV SOLN
0.0000 ug/min | INTRAVENOUS | Status: DC
  Administered 2017-05-10: 30 ug/min via INTRAVENOUS
  Administered 2017-05-10: 20 ug/min via INTRAVENOUS
  Administered 2017-05-11 (×2): 40 ug/min via INTRAVENOUS
  Filled 2017-05-10 (×5): qty 16

## 2017-05-10 MED ORDER — PANTOPRAZOLE SODIUM 40 MG IV SOLR: 40.0000 mg | INTRAVENOUS | Status: DC

## 2017-05-10 MED ORDER — HEPARIN (PORCINE) IN NACL 100-0.45 UNIT/ML-% IJ SOLN: 1000.0000 [IU]/h | INTRAMUSCULAR | Status: DC

## 2017-05-10 MED ORDER — CHLORHEXIDINE GLUCONATE 0.12% ORAL RINSE (MEDLINE KIT)
15.0000 mL | Freq: Two times a day (BID) | OROMUCOSAL | Status: DC
  Administered 2017-05-10 – 2017-05-11 (×2): 15 mL via OROMUCOSAL

## 2017-05-10 MED ORDER — CHLORHEXIDINE GLUCONATE 0.12% ORAL RINSE (MEDLINE KIT)
15.0000 mL | Freq: Two times a day (BID) | OROMUCOSAL | Status: DC
  Administered 2017-05-10: 15 mL via OROMUCOSAL

## 2017-05-10 MED ORDER — PROPOFOL 1000 MG/100ML IV EMUL
0.0000 ug/kg/min | INTRAVENOUS | Status: DC
  Filled 2017-05-10: qty 100

## 2017-05-10 MED ORDER — ORAL CARE MOUTH RINSE: 15.0000 mL | Freq: Four times a day (QID) | OROMUCOSAL | Status: DC

## 2017-05-10 MED ORDER — SODIUM CHLORIDE 0.9 % IV BOLUS (SEPSIS): 1000.0000 mL | Freq: Once | INTRAVENOUS | Status: DC

## 2017-05-10 MED ORDER — DEXTROSE 5 % IV SOLN
1.0000 g | Freq: Three times a day (TID) | INTRAVENOUS | Status: DC
  Filled 2017-05-10 (×2): qty 1

## 2017-05-10 MED ORDER — DEXTROSE 50 % IV SOLN: 25.0000 mL | INTRAVENOUS | Status: DC | PRN

## 2017-05-10 MED ORDER — ORAL CARE MOUTH RINSE
15.0000 mL | OROMUCOSAL | Status: DC
  Administered 2017-05-10 (×6): 15 mL via OROMUCOSAL

## 2017-05-10 MED ORDER — NOREPINEPHRINE BITARTRATE 1 MG/ML IV SOLN
0.0000 ug/min | INTRAVENOUS | Status: DC
  Administered 2017-05-10: 15 ug/min via INTRAVENOUS
  Filled 2017-05-10: qty 4

## 2017-05-10 MED ORDER — SODIUM CHLORIDE 0.9 % IV SOLN: INTRAVENOUS | Status: DC

## 2017-05-10 MED ORDER — PROPOFOL 10 MG/ML IV BOLUS
20.0000 mg | Freq: Once | INTRAVENOUS | Status: AC
  Administered 2017-05-10: 20 mg via INTRAVENOUS

## 2017-05-10 MED ORDER — MIDAZOLAM HCL 2 MG/2ML IJ SOLN
1.0000 mg | INTRAMUSCULAR | Status: DC | PRN
Start: 2017-05-10 — End: 2017-05-11
  Administered 2017-05-10 – 2017-05-11 (×2): 1 mg via INTRAVENOUS
  Filled 2017-05-10 (×2): qty 2

## 2017-05-10 MED FILL — Medication: Qty: 1 | Status: AC

## 2017-05-10 NOTE — Plan of Care (Signed)
Problem: Tissue Perfusion: Goal: Risk factors for ineffective tissue perfusion will decrease Outcome: Progressing Doppler pulses BLLE

## 2017-05-10 NOTE — Progress Notes (Signed)
Patient noted to have respiratory distress with desat in the low 80s.  Escalated to venturi, then NRB at 100%; Patient still alert and oriented and following commands; RT notified; MD notified; Elink notified;  Sats improved into low 90s; with occasional desat in to high 80s with sustained increased work of breathing; MD arrived to assess for bipap vs intubation; CCM initiated RSI; patient intubated and became hypotensive then PEA; Code blue initiated; See Code sheet for code timeline. Patient regained pulse; labs drawn; pressors initiated. Family notified and en route.  Clyda Hurdle RN

## 2017-05-10 NOTE — Progress Notes (Signed)
CRITICAL VALUE ALERT  Critical Value:  Troponin .27 Date & Time Notied:  05/10/2017  0815  Provider Notified: Dr. Tonny Branch  Orders Received/Actions taken: continue to monitor pt

## 2017-05-10 NOTE — Progress Notes (Signed)
  Echocardiogram 2D Echocardiogram has been performed.  Emma Yu 05/10/2017, 11:03 AM

## 2017-05-10 NOTE — Progress Notes (Signed)
PULMONARY / CRITICAL CARE MEDICINE   Name: Emma Yu MRN: [id number] DOB: May 29, 1927    ADMISSION DATE:  05/10/2017  HISTORY OF PRESENT ILLNESS:   83 year old female with PMH of Afib not on anticoagulation (due to fall risk), previous bilateral iliac artery stenting, CKD, CAD, dHF, HTN, HLD, AS, and DM on insulin gtt, admitted on 9/20 with acute aortic occlusion.  She was in her usual state of health, independent in [place], lives with son, until 9/20 and could not stand up after using bathroom.  On arrival to ED, she could not move or feel her legs.  She was emergently taken to the OR per Dr. Donzetta Matters with aortogram and aortoiliac thromboembolectomy performed.  She was extubated postoperatively and taken to the ICU.  She was noted to have pink tinged urine and progressive decreased UO, hypotension, and hypoxia requiring NRB and fluid bolus.  PAST MEDICAL HISTORY :  She  has a past medical history of Acute respiratory failure ([place]); Aortic stenosis; Arthritis; Atrial fibrillation ([place]); CAD (coronary artery disease); CHF (congestive heart failure) ([place]); Chronic atrial fibrillation with RVR ([place]); Chronic diastolic heart failure ([place]); Chronic mid back pain; DJD (degenerative joint disease) of hip; DVT (deep venous thrombosis) ([place]) (~ 2012); History of blood transfusion (1957); Hyperlipidemia; Hypertension; Insulin pump in place; Iron deficiency anemia; Kidney stones (~ 1958); Migraine; Mitral stenosis; Myocardial infarction [facility]) (12/2010); PAD (peripheral artery disease) ([place]); Skin cancer; Type II diabetes mellitus ([place]) (dx'd 1999); and Venous insufficiency.  PAST SURGICAL HISTORY: She  has a past surgical history that includes Total hip arthroplasty (Right, 1991); Lumbar disc surgery (1999); Cardiac catheterization (01/10/2011); Abdominal hysterectomy; Cataract extraction, bilateral (Bilateral, 2015); Tonsillectomy; Back surgery; Cholecystectomy open; Appendectomy; Joint replacement; Revision  total hip arthroplasty (1994); Iliac artery stent (Bilateral, 2002); and Skin cancer excision (2016).  Allergies  Allergen Reactions  . Carvedilol Other (See Comments)    Heart stops CARDIAC ARREST  . Augmentin [Amoxicillin-Pot Clavulanate] Diarrhea  . Clindamycin/Lincomycin Other (See Comments)    tremors  . Diltiazem Hcl Other (See Comments)    Chest pain;   07/02/14-  Patient has been able to tolerate diltiazem PO as inpatient since 06/29/14 without any adverse reaction.  . Fenofibrate Other (See Comments)    Unknown - [facility] - MAR  . Hctz [Hydrochlorothiazide] Other (See Comments)    Chest pain  . Nisoldipine Other (See Comments)    Chest pain  . Nitroglycerin Other (See Comments)    Unknown - NH MAR  . Propranolol Diarrhea    Chest pain  . Tekturna [Aliskiren Fumarate] Other (See Comments)    Unknown reaction  . Valturna [Aliskiren-Valsartan] Other (See Comments)    Unknown reaction  . Septra [Sulfamethoxazole-Trimethoprim] Rash    Rash, cyst  . Sulfa Antibiotics Rash    No current facility-administered medications on file prior to encounter.    Current Outpatient Prescriptions on File Prior to Encounter  Medication Sig  . acetaminophen (TYLENOL) 500 MG tablet Take 500-1,000 mg by mouth every 6 (six) hours as needed (for pain).   Marland Kitchen amLODipine (NORVASC) 5 MG tablet Take 5 mg by mouth daily.  Marland Kitchen aspirin EC 81 MG tablet Take 81 mg by mouth 2 (two) times daily.  Marland Kitchen b complex vitamins tablet Take 1 tablet by mouth daily.    . calcium-vitamin D (OSCAL WITH D) 500-200 MG-UNIT tablet Take 1 tablet by mouth 2 (two) times daily.  . Cholecalciferol (VITAMIN D3) 2000 UNITS capsule Take 2,000 Units by mouth daily.    Marland Kitchen  gabapentin (NEURONTIN) 300 MG capsule TAKE 2 CAPSULES BY MOUTH AT BEDTIME  . insulin lispro (HUMALOG) 100 UNIT/ML injection Inject into the skin continuous. INSULIN PUMP  . levocetirizine (XYZAL) 5 MG tablet Take 1 tablet (5 mg total) by mouth every evening.  . metoprolol  tartrate (LOPRESSOR) 100 MG tablet Take 1 tablet (100 mg total) by mouth 2 (two) times daily.  Marland Kitchen omeprazole (PRILOSEC) 40 MG capsule TAKE 1 CAPSULE BY MOUTH  DAILY BEFORE BREAKFAST  . potassium chloride SA (K-DUR,KLOR-CON) 20 MEQ tablet Take 20 mEq by mouth 2 (two) times daily.   . pravastatin (PRAVACHOL) 20 MG tablet Take 20 mg by mouth daily.  . Probiotic Product (PROBIOTIC PO) Take 1 tablet by mouth daily at 10 pm.  . torsemide (DEMADEX) 20 MG tablet Take 1 tablet (20 mg total) by mouth 2 (two) times daily.  . traMADol (ULTRAM) 50 MG tablet TAKE 2 TABLETS BY MOUTH EVERY 6 HOURS AS NEEDED FOR PAIN  . Continuous Blood Gluc Sensor (FREESTYLE LIBRE SENSOR SYSTEM) MISC 1 Device by Does not apply route as directed. 1 device every 10 days  . glucose blood (ONE TOUCH ULTRA TEST) test strip 1 each by Other route 4 (four) times daily. And lancets 4/day   FAMILY HISTORY:  Her indicated that her mother is deceased. She indicated that her father is deceased. She indicated that her maternal grandmother is deceased. She indicated that her maternal grandfather is deceased. She indicated that her paternal grandmother is deceased. She indicated that her paternal grandfather is deceased. She indicated that the status of her other is unknown.   SOCIAL HISTORY: She  reports that she quit smoking about 31 years ago. Her smoking use included Cigarettes. She has a 74.00 pack-year smoking history. She has never used smokeless tobacco. She reports that she does not drink alcohol or use drugs.  REVIEW OF SYSTEMS:   Review of Systems  Unable to perform ROS: Critical illness   VITAL SIGNS: BP 133/63   Pulse 98   Temp (!) 97 F (36.1 C) (Axillary)   Resp 12   Ht 5\' 4"  (1.626 m)   Wt 63.5 kg (139 lb 15.9 oz)   LMP  (LMP Unknown)   SpO2 97%   BMI 24.03 kg/m   HEMODYNAMICS: CVP:  [6 mmHg-7 mmHg] 6 mmHg  VENTILATOR SETTINGS: Vent Mode: PRVC FiO2 (%):  [100 %] 100 % Set Rate:  [12 bmp] 12 bmp Vt Set:  [440  mL] 440 mL PEEP:  [10 cmH20] 10 cmH20 Plateau Pressure:  [21 cmH20] 21 cmH20  INTAKE / OUTPUT: I/O last 3 completed shifts: In: 2085.2 [I.V.:1885.2; IV Piggyback:200] Out: 9937 [Urine:985; Blood:200]  PHYSICAL EXAMINATION: General: elderly female, intubated and unresponsive, critically ill Neuro: no response to sternal rub, not on sedation gtt (getting IV PRN only), pupils 83mm equal b/l and sluggishly responsive HEENT: OP clear, MM dry, Orally intubated Cardiovascular: RRR no m/r/g  Lungs: Coarse breath sounds b/l Abdomen: Moderately distended but soft, absent bowel sounds, surgical incisions in b/l femoral areas are clean/dry/intact but with some surrounding ecchymoses Musculoskeletal: no pedal edema; BLE cool to touch, can't palpate pulses but can doppler them Skin: no rashes   LABS:  BMET  Recent Labs Lab 04/26/2017 1233 04/30/2017 1255 05/05/2017 1916 05/10/2017 1924 05/10/17 0030  NA 138 142 144 144 140  K 4.5 4.6 5.2* 5.2* 4.5  CL 99* 103  --   --  103  CO2 21*  --   --   --  23  BUN 46* 51*  --   --  49*  CREATININE 1.49* 1.30*  --   --  1.43*  GLUCOSE 460* 488* 165*  --  129*    Electrolytes  Recent Labs Lab 04/29/2017 1233 05/10/17 0030 05/10/17 0700  CALCIUM 10.3 9.1  --   MG  --   --  2.5*  PHOS  --   --  6.4*    CBC  Recent Labs Lab 05/12/2017 1233  04/23/2017 1924 05/10/17 0030 05/10/17 0459  WBC 14.6*  --   --  17.7*  --   HGB 15.4*  < > 12.9 14.6 13.3  HCT 48.1*  < > 38.0 46.6* 42.0  PLT 207  --   --  167  --   < > = values in this interval not displayed.  Coag's No results for input(s): APTT, INR in the last 168 hours.  Sepsis Markers  Recent Labs Lab 05/13/2017 1256  LATICACIDVEN 6.25*    ABG  Recent Labs Lab 05/02/2017 1924 05/10/17 0539 05/10/17 0658  PHART 7.393 7.452* 7.185*  PCO2ART 45.8 40.0 69.3*  PO2ART 107.0 136.0* 300*    Liver Enzymes  Recent Labs Lab 05/19/2017 1233  AST 61*  ALT 39  ALKPHOS 157*  BILITOT 1.0   ALBUMIN 3.9    Cardiac Enzymes  Recent Labs Lab 04/21/2017 1233 05/10/17 0700  TROPONINI <0.03 0.27*    Glucose  Recent Labs Lab 05/10/17 0000 05/10/17 0100 05/10/17 0154 05/10/17 0253 05/10/17 0353 05/10/17 0513  GLUCAP 139* 127* 113* 105* 139* 133*    Imaging Ct Angio Aortobifemoral W And/or Wo Contrast  Result Date: 04/29/2017 CLINICAL DATA:  83 year old with with new leg symptoms. Mottled appearance of the legs. Unable to feel femoral pulses. History of bilateral iliac stents. EXAM: CT ANGIOGRAPHY CHEST, ABDOMEN, PELVIS AND RUNOFF TECHNIQUE: Multidetector CT imaging through the chest, abdomen, pelvis and runoff was performed using the standard protocol during bolus administration of intravenous contrast. Multiplanar reconstructed images and MIPs were obtained and reviewed to evaluate the vascular anatomy. CONTRAST:  80 mL Isovue 370 COMPARISON:  Chest CT 05/31/2016 FINDINGS: CTA CHEST FINDINGS Cardiovascular: Atherosclerotic calcifications in the thoracic aorta without aneurysm. Great vessels are patent. Central pulmonary arteries are patent. Extensive motion artifact in the chest. Prominent mitral annular calcifications. Mediastinum/Nodes: Evidence for small thyroid nodules. Multiple small lymph nodes throughout the mediastinum which are not well characterized on this examination but minimal change from the prior examination. Lungs/Pleura: Trachea and mainstem bronchi are patent. Trace right pleural fluid. Patchy densities throughout the lower lobes bilaterally. Small amount of ground-glass disease in the upper lobes bilaterally. Musculoskeletal: No acute abnormality. Review of the MIP images confirms the above findings. CTA ABDOMEN AND PELVIS AND RUNOFF FINDINGS VASCULAR Aorta: Occlusion of the abdominal aorta just below the IMA. Atherosclerotic calcifications in abdominal aorta without aneurysm. Celiac: Patent without evidence of aneurysm, dissection, vasculitis or significant  stenosis. SMA: Atherosclerotic calcifications at the origin the SMA without significant stenosis. SMA is patent. Renals: A critical stenosis at the origin of the left renal artery. Main right renal artery is patent with mild-to-moderate stenosis at the origin. IMA: IMA is patent. Inflow: Patient has bilateral common iliac artery stents which are completely occluded. There is minor reconstitution of the right internal iliac artery. Minimal flow in the right external iliac artery. Some reconstitution in the left internal iliac artery branches but limited evaluation. No significant flow in the left external iliac artery. Right lower extremity: There may be  some contrast in the proximal right profunda femoral arteries but limited evaluation due to the artifact from the adjacent right hip replacement. No contrast identified in the right SFA. No contrast identified in the right popliteal artery or right runoff vessels. Left lower extremity: There appears to be minor reconstitution in left common femoral artery. There is reconstitution in the deep left femoral arteries. No significant contrast in the left SFA or left popliteal artery. No significant contrast in the runoff vessels. Veins: No obvious venous abnormality within the limitations of this arterial phase study. Review of the MIP images confirms the above findings. NON-VASCULAR Hepatobiliary: No acute abnormality in the liver. Gallbladder appears to be surgically absent. Pancreas: Normal appearance of the pancreas without inflammation or duct dilatation. Spleen: Normal appearance of spleen without enlargement. Adrenals/Urinary Tract: Adrenal glands are unremarkable. Scarring in the left kidney upper pole. Decreased enhancement in the right kidney lower pole probably related to compromised arterial flow. Question an accessory right renal artery which is occluded from the aorta. Small calcification in the right kidney lower pole. Urinary bladder is distended.  Stomach/Bowel: Extensive diverticulosis in the sigmoid colon. Suspect a right hemicolectomy. No evidence for bowel obstruction or focal bowel inflammation. Moderate distention of the stomach. Lymphatic: No significant lymph node enlargement in the abdomen or pelvis. Reproductive: Status post hysterectomy. No adnexal masses. Other: No free fluid.  No free air. Musculoskeletal: Right hip replacement is located. No acute bone abnormality. Review of the MIP images confirms the above findings. IMPRESSION: Occlusion of the distal abdominal aorta just below the inferior mesenteric artery. Bilateral common iliac arteries are occluded. Limited evaluation of the blood flow to the lower extremities due to the slow arterial flow and timing of the study. Infarct involving the right kidney lower pole. Critical stenosis and near occlusion of the left renal artery. Patchy lung disease bilaterally particularly in the posterior lower lobes. Some this could be related to atelectasis or asymmetric pulmonary edema. Infectious or inflammatory process cannot be completely excluded. Trace right pleural fluid. These results were called by telephone at the time of interpretation on 05/04/2017 at 3:28 pm to Dr. Jola Schmidt , who verbally acknowledged these results. Electronically Signed   By: Markus Daft M.D.   On: 05/14/2017 15:54   Dg Chest Port 1 View  Result Date: 05/10/2017 CLINICAL DATA:  Hypoxia EXAM: PORTABLE CHEST 1 VIEW COMPARISON:  May 09, 2017 FINDINGS: Endotracheal tube tip is 4.8 cm above the carina. Central catheter tip is in the superior cava. Nasogastric tube tip and side port are below the diaphragm. No pneumothorax. There is patchy consolidation in the left base with small left pleural effusion. Right lung is clear. Heart is mildly enlarged with pulmonary vascularity within normal limits. There is aortic atherosclerosis. There is calcification of the mitral annulus. No bone lesions. IMPRESSION: Tube and catheter  positions as described without pneumothorax. Persistent patchy consolidation left base with small left pleural effusion. Question a degree of pneumonia left base. Right lung clear. Stable cardiac silhouette. There is aortic atherosclerosis. Aortic Atherosclerosis (ICD10-I70.0). Electronically Signed   By: Lowella Grip III M.D.   On: 05/10/2017 07:02   Dg Chest Portable 1 View  Result Date: 05/08/2017 CLINICAL DATA:  preop vascular surgery. Central line placement EXAM: PORTABLE CHEST 1 VIEW COMPARISON:  Chest x-ray dated 07/06/2016. Chest CT from earlier same day. FINDINGS: Cardiomegaly is stable.  Aortic atherosclerosis. Patchy bibasilar airspace opacities. Stable interstitial opacities within the upper lobes. IMPRESSION: 1. Patchy bibasilar airspace opacities,  compatible with the patchy bibasilar consolidations seen on chest CT earlier today, atelectasis versus pneumonia, possible component of aspiration. 2. Cardiomegaly. Additional interstitial thickening within the upper lobe suggests mild CHF/volume overload. No evidence of overt alveolar pulmonary edema. 3. Aortic atherosclerosis. Electronically Signed   By: Franki Cabot M.D.   On: 05/14/2017 20:57   Ct Angio Chest/abd/pel For Dissection W And/or Wo Contrast  Result Date: 05/02/2017 CLINICAL DATA:  83 year old with with new leg symptoms. Mottled appearance of the legs. Unable to feel femoral pulses. History of bilateral iliac stents. EXAM: CT ANGIOGRAPHY CHEST, ABDOMEN, PELVIS AND RUNOFF TECHNIQUE: Multidetector CT imaging through the chest, abdomen, pelvis and runoff was performed using the standard protocol during bolus administration of intravenous contrast. Multiplanar reconstructed images and MIPs were obtained and reviewed to evaluate the vascular anatomy. CONTRAST:  80 mL Isovue 370 COMPARISON:  Chest CT 05/31/2016 FINDINGS: CTA CHEST FINDINGS Cardiovascular: Atherosclerotic calcifications in the thoracic aorta without aneurysm. Great  vessels are patent. Central pulmonary arteries are patent. Extensive motion artifact in the chest. Prominent mitral annular calcifications. Mediastinum/Nodes: Evidence for small thyroid nodules. Multiple small lymph nodes throughout the mediastinum which are not well characterized on this examination but minimal change from the prior examination. Lungs/Pleura: Trachea and mainstem bronchi are patent. Trace right pleural fluid. Patchy densities throughout the lower lobes bilaterally. Small amount of ground-glass disease in the upper lobes bilaterally. Musculoskeletal: No acute abnormality. Review of the MIP images confirms the above findings. CTA ABDOMEN AND PELVIS AND RUNOFF FINDINGS VASCULAR Aorta: Occlusion of the abdominal aorta just below the IMA. Atherosclerotic calcifications in abdominal aorta without aneurysm. Celiac: Patent without evidence of aneurysm, dissection, vasculitis or significant stenosis. SMA: Atherosclerotic calcifications at the origin the SMA without significant stenosis. SMA is patent. Renals: A critical stenosis at the origin of the left renal artery. Main right renal artery is patent with mild-to-moderate stenosis at the origin. IMA: IMA is patent. Inflow: Patient has bilateral common iliac artery stents which are completely occluded. There is minor reconstitution of the right internal iliac artery. Minimal flow in the right external iliac artery. Some reconstitution in the left internal iliac artery branches but limited evaluation. No significant flow in the left external iliac artery. Right lower extremity: There may be some contrast in the proximal right profunda femoral arteries but limited evaluation due to the artifact from the adjacent right hip replacement. No contrast identified in the right SFA. No contrast identified in the right popliteal artery or right runoff vessels. Left lower extremity: There appears to be minor reconstitution in left common femoral artery. There is  reconstitution in the deep left femoral arteries. No significant contrast in the left SFA or left popliteal artery. No significant contrast in the runoff vessels. Veins: No obvious venous abnormality within the limitations of this arterial phase study. Review of the MIP images confirms the above findings. NON-VASCULAR Hepatobiliary: No acute abnormality in the liver. Gallbladder appears to be surgically absent. Pancreas: Normal appearance of the pancreas without inflammation or duct dilatation. Spleen: Normal appearance of spleen without enlargement. Adrenals/Urinary Tract: Adrenal glands are unremarkable. Scarring in the left kidney upper pole. Decreased enhancement in the right kidney lower pole probably related to compromised arterial flow. Question an accessory right renal artery which is occluded from the aorta. Small calcification in the right kidney lower pole. Urinary bladder is distended. Stomach/Bowel: Extensive diverticulosis in the sigmoid colon. Suspect a right hemicolectomy. No evidence for bowel obstruction or focal bowel inflammation. Moderate distention of the  stomach. Lymphatic: No significant lymph node enlargement in the abdomen or pelvis. Reproductive: Status post hysterectomy. No adnexal masses. Other: No free fluid.  No free air. Musculoskeletal: Right hip replacement is located. No acute bone abnormality. Review of the MIP images confirms the above findings. IMPRESSION: Occlusion of the distal abdominal aorta just below the inferior mesenteric artery. Bilateral common iliac arteries are occluded. Limited evaluation of the blood flow to the lower extremities due to the slow arterial flow and timing of the study. Infarct involving the right kidney lower pole. Critical stenosis and near occlusion of the left renal artery. Patchy lung disease bilaterally particularly in the posterior lower lobes. Some this could be related to atelectasis or asymmetric pulmonary edema. Infectious or inflammatory  process cannot be completely excluded. Trace right pleural fluid. These results were called by telephone at the time of interpretation on 05/05/2017 at 3:28 pm to Dr. Jola Schmidt , who verbally acknowledged these results. Electronically Signed   By: Markus Daft M.D.   On: 05/10/2017 15:54   SIGNIFICANT EVENTS: 9/20 Admitted, OR, extubated 9/21 Intubated, Cardiac Arrest x 43mins  ANTIBIOTICS: Vancomycin (started 9/20 PM)  LINES/TUBES: RIJ TLC (placed 9/20) Left radial A-line (placed 9/20) 7.0 ETT  Foley Catheter OG tube 2-PIV's  ASSESSMENT / PLAN: 68 yoF w/previous previous bilateral iliac artery stenting (2002) who presented on 9/20 unable to walk, move, or feel bilateral legs found to have an acute aortic occulusion taken to the OR for aortoiliac thromboembolectomy. Extubated post-op but progressively became hypotensive and hypoxic who required emergent intubation and subsequently suffered PEA/ VT cardiac arrest x 5 mins.     PULMONARY 1. Acute hypoxic and Acute Hypercapneic respiratory failure; Aspiration Pneumonia - continue mechanical ventilation, severe hypercapnea this AM for which increased RR from 12 to 20; repeating ABG now.  - CXR showed a LLL infiltrate. It is likely she aspirated during the cardiac arrest. Will obtain sputum culture and start Aztreonam in addition to the Vanc she is already on. - repeat CXR in AM - NPO; continue GI prophylaxis; holding DVT prophylaxis due to GIB - VAP prevention measures   CARDIOVASCULAR 1. Cardiac arrest - patient underwent a PEA/VT arrest overnight requiring 5 minutes of CPR before attaining ROSC. She was not at neurologic baseline post arrest but was not started on therapeutic hypothermia given her bleeding - TTE ordered - At some point will need Head CT once more stable 2. Hx Afib; Afib with RVR: - has hx Afib; went into RVR post-op. - currently is rate-controlled and on heparin gtt - is not chronically on anticoag prior to this  admission due to fall risk.   - replete electrolytes 3. Acute Aortic Occulusion s/p aortoiliac thromboembolectomy (04/21/2017) - abdomen became more distended post-op, raising concern for bleeding into abdomen or development of compartment syndrome - bladder pressure was 12 overnight; repeat measurement now 9-10 - Hgb stable postop and Surgery does not think she is bleeding into her abdomen - stopping heparin gtt now given severity of her UGIB. - Abdominal ultrasound is pending. 4. Shock: - unclear if this is cardiogenic vs hypovolemic vs from PE; unlikely would develop septic shock so quickly, so think that is less likely. - TTE pending to eval LVEF, RV pressure, and IVC size/respiratory variation - continue levophed gtt - continue monitoring CVP and UOP - continue to trend lactates 5. Hx HTN, HLD, CAD, dHF, mod AS, mild MS, PAD  RENAL 1. Rhabdomyolysis; AKI-on-CKD - most recent creatinine is 1.43, which is  at her baseline CKD of 1.3-1.5; however now post-arrest she has oliguric AKI with only 10cc UOP since the arrest.  - CVP 6-7, but this is on PEEP 10 on the vent so likely not accurate. Will ultrasound her IVC as she may need more IVF - continue monitoring UOP closely - rhabdo with CK 14,950; continue to monitor CKq8hrs - Replace electrolytes as indicated - Consider Nephrology consult   GASTROINTESTINAL 1. UGIB:  - has approx 600cc of coffee ground emesis in her OG tube cannister - continue NPO; start PPI gtt - check INR and Fibrinogen to r/o DIC  HEMATOLOGIC 1. Hx DVT; Hx Anemia: - holding anticoag as above - monitor Hgb  INFECTIOUS 1. Aspiration Pneumonia: - obtain sputum culture - start Aztreonam; continue Vanc - trend lactate and procalcitonin  ENDOCRINE 1. Insulin-dependent DM: - is on an insulin pump at home; continue insulin gtt here - NPO   NEUROLOGIC 1 Acute Encephalopathy:  - started post-arrest likely anoxic encephalopathy; continue to monitor.  Fentanyl PRN for vent tolerance. Head CT once more stable.   FAMILY  - Updates: Family contacted by RN and are on their way.  - Inter-disciplinary family meet or Palliative Care meeting due by:  05/17/17  60 minutes critical care time  Vernie Murders, MD Pulmonary and [place] Pager: [phone]  05/10/2017, 8:20 AM

## 2017-05-10 NOTE — Care Management Note (Signed)
Case Management Note  Patient Details  Name: LISSY DEUSER MRN: [id number] Date of Birth: Jan 21, 1927  Subjective/Objective:    From home with son, presents with acute aortic occlusion  taken to the OR for aortoiliac thromboembolectomy. Extubated post-op but progressively became hypotensive and hypoxic , required emergent intubation and  suffered PEA/ VT cardiac arrest x 5 mins.                 Action/Plan: NCM will follow for dc needs.   Expected Discharge Date:                  Expected Discharge Plan:     In-House Referral:     Discharge planning Services  CM Consult  Post Acute Care Choice:    Choice offered to:     DME Arranged:    DME Agency:     HH Arranged:    HH Agency:     Status of Service:  In process, will continue to follow  If discussed at Long Length of Stay Meetings, dates discussed:    Additional Comments:  Zenon Mayo, RN 05/10/2017, 2:42 PM

## 2017-05-10 NOTE — Progress Notes (Addendum)
eLink Physician-Brief Progress Note Patient Name: Emma Yu DOB: August 11, 1927 MRN: [id number]   Date of Service  05/10/2017  HPI/Events of Note  83 yo female with PMH of AFIB, CKD, CHF. Presents with acute occlusion of Aorta. Now s/p declotting of Aorta by Vascular Surgery. Extubated post op. PCCM consulted d/t AFIB and hypoxia requiring 100% NRBM.   eICU Interventions  Will order: 1. BiPAP. Keep sat >= 92%. 2. Ground team notified of urgent need to evaluate the patient at bedside.      Intervention Category Evaluation Type: New Patient Evaluation  Lysle Dingwall 05/10/2017, 5:31 AM

## 2017-05-10 NOTE — Procedures (Addendum)
Endotracheal Intubation Procedure Note  Indication for endotracheal intubation: impending respiratory failure. Airway Assessment: Mallampati Class: I (soft palate, uvula, fauces, and tonsillar pillars visible). Sedation: versed, propofol. Paralytic: rocuronium. Lidocaine: no. Atropine: no. Equipment: Macintosh 3 laryngoscope blade and 7.28mm cuffed endotracheal tube. Cricoid Pressure: no. Number of attempts: 1. ETT location confirmed by by auscultation, by CXR and ETCO2 monitor.  Pt was in A fib with ST elevation on monitor prior to intubation and unresponsive Levophed ggt started as BP was low prior to intubation IVF on board Despite efforts to keep patient hemodynamically stable during intubation She becaame hypotensive and PEA CODE blue called arrest lasted 5 mins total meds received: 2 epi pushes and 1 bicarb.  Rhythm at 3 mins into code VTach received shock followed by  compressions ROSC achieved Vasc Surgery aware  Kandice Hams 05/10/2017

## 2017-05-10 NOTE — Progress Notes (Signed)
PT Cancellation Note  Patient Details Name: MASHAL SLAVICK MRN: [id number] DOB: 21-Jan-1927   Cancelled Treatment:    Reason Eval/Treat Not Completed: Medical issues which prohibited therapy; note pt coded this morning and now intubated, sedated and on pressors.  Will defer treatment today and check back as medically appropriate.    Reginia Naas 05/10/2017, 8:39 AM  Magda Kiel, [place] 05/10/2017

## 2017-05-10 NOTE — Procedures (Signed)
Intubation Procedure Note Emma Yu [id number] 04-26-1927  Procedure: Intubation Indications: Airway protection and maintenance  Procedure Details Consent: Risks of procedure as well as the alternatives and risks of each were explained to the (patient/caregiver).  Consent for procedure obtained. Time Out: Verified patient identification, verified procedure, site/side was marked, verified correct patient position, special equipment/implants available, medications/allergies/relevent history reviewed, required imaging and test results available.  Performed  Maximum sterile technique was used including gloves and hand hygiene.  MAC and 3    Evaluation Hemodynamic Status: Transient hypotension treated with pressors and fluid; O2 sats: transiently fell during during procedure Patient's Current Condition: stable Complications: Complications of coded Patient did not tolerate procedure well. Chest X-ray ordered to verify placement.  CXR: tube position acceptable   RT called to bedside to pt desatting and CCMD decided on intubating. During hyperoxygenating pt coded and began CPR. HR  established after several rounds of CPR, EPI and Shock. RT placed pt on vent. Settings 440, 12, +10, 100% per CCMD. RT to cont to monitor.    [name] 05/10/2017

## 2017-05-10 NOTE — Progress Notes (Signed)
Dr. Donzetta Matters notified of 600 cc dark red bloody material obtained from OG when placed to intermittent suction. Will continue to monitor pt closely.

## 2017-05-10 NOTE — Code Documentation (Signed)
PATIENT NAME: Emma Yu MEDICAL RECORD NUMBER: [id number] Birthday: 24-Jan-1927  Age: 83 y.o. Admit Date: 05/16/2017  Provider: Dr. Gilford Raid  Indication: PEA arrest  Technical Description:   CPR performance duration: 5 mins, 0607 to 32  Was defibrillation or cardioversion used ? Yes- VT  Was external pacer placed ? yes  Was patient intubated pre/post CPR ? Yes- pre  Was transvenous pacer placed ? no  Medications Administered Include      Yes/no Amiodarone   Atropin   Calcium   Epinephrine   X 2  Lidocaine   Magnesium   Norepinephrine     Phenylephrine   Sodium bicarbonate   X 1   Vasopression    Evaluation  Final Status - Was patient successfully resuscitated ? yes  If successfully resuscitated - what is current rhythm ? Afib If successfully resuscitated - what is current hemodynamic status ? Unstable/ Critical   Kennieth Rad, AGACNP-BC [facility] Pgr: [phone] or if no answer [phone] 05/10/2017, 7:02 AM

## 2017-05-10 NOTE — Progress Notes (Signed)
CRITICAL VALUE ALERT  Critical Value: Lactic Acid  4.6  Date & Time Notied:  05/10/2017 1610  Provider Notified: Dr. Jimmey Ralph  Orders Received/Actions taken: orders written, continue to monitor pt

## 2017-05-10 NOTE — Progress Notes (Addendum)
Chaplain received a page from patient's pastor about the events of the evening regarding this patient.  Patient's pastor is in [place] today and cannot be here right now.  She request that I look in on patient and any family that may be present.  Chaplain will look in on patient and family again after lunch.Bonney Roussel did look in on patient but NO family in waiting room right now.  Will continue to look in on patient and provide care for family.   Chaplain checked back after lunch and found son in room.  Chaplain and son chatted about this experience for him and the family.  Patient thinking about the events that led his mother here for this visit.  Chaplain gave space for son to reflect and provided ministry of presence.   05/10/17 1123  Clinical Encounter Type  Visited With Patient  Visit Type Initial;Psychological support;Spiritual support;Social support

## 2017-05-10 NOTE — Progress Notes (Signed)
OT Cancellation Note  Patient Details Name: Emma Yu MRN: [id number] DOB: 05/22/1927   Cancelled Treatment:    Reason Eval/Treat Not Completed: Patient not medically ready;Medical issues which prohibited therapy. Note code event this am with pt now intubated, sedated, and on requiring pressors. Will hold OT evaluation this date and check back as medically appropriate.  Norman Herrlich, MS OTR/L  Pager: [phone]   Norman Herrlich 05/10/2017, 7:43 AM

## 2017-05-10 NOTE — Progress Notes (Signed)
Patient noted to have trending hypotension since arrival; belly appears to be larger than initial assessment; MD notified; MD advised use of PRN 556mL bolus NS. Also asked MD if okay to transition from [facility] ICU hyperglycemic protocol; MD agreed with use; however must contact Diabetes coordinator due to patient insulin pump prior to admit.  Will continue to monitor.  Clyda Hurdle RN

## 2017-05-10 NOTE — Progress Notes (Signed)
Pharmacy Antibiotic Note  Emma Yu is a 83 y.o. female admitted on 04/20/2017 with acute abdominal aorta occlusion.  Pharmacy has been consulted for vancomycin and cefepime dosing. Pt is s/p cardiac arrest after angiogram requiring intubation. CXR shows LLL infiltrate and concern for HCAP.  Plan: -Cefepime 1g IV q24h -Vancomycin 1000mg  IV q24h -Monitor cultures, renal function, LOT -Vancomycin level as indicated  Height: 5\' 4"  (162.6 cm) Weight: 139 lb 15.9 oz (63.5 kg) IBW/kg (Calculated) : 54.7  Temp (24hrs), Avg:97.2 F (36.2 C), Min:96.5 F (35.8 C), Max:98.6 F (37 C)   Recent Labs Lab 04/24/2017 1233 04/21/2017 1255 05/05/2017 1256 05/10/17 0030 05/10/17 0713  WBC 14.6*  --   --  17.7*  --   CREATININE 1.49* 1.30*  --  1.43*  --   LATICACIDVEN  --   --  6.25*  --  4.6*    Estimated Creatinine Clearance: 23.9 mL/min (A) (by C-G formula based on SCr of 1.43 mg/dL (H)).    Allergies  Allergen Reactions  . Carvedilol Other (See Comments)    Heart stops CARDIAC ARREST  . Augmentin [Amoxicillin-Pot Clavulanate] Diarrhea  . Clindamycin/Lincomycin Other (See Comments)    tremors  . Diltiazem Hcl Other (See Comments)    Chest pain;   07/02/14-  Patient has been able to tolerate diltiazem PO as inpatient since 06/29/14 without any adverse reaction.  . Fenofibrate Other (See Comments)    Unknown - [facility] - MAR  . Hctz [Hydrochlorothiazide] Other (See Comments)    Chest pain  . Nisoldipine Other (See Comments)    Chest pain  . Nitroglycerin Other (See Comments)    Unknown - NH MAR  . Propranolol Diarrhea    Chest pain  . Tekturna [Aliskiren Fumarate] Other (See Comments)    Unknown reaction  . Valturna [Aliskiren-Valsartan] Other (See Comments)    Unknown reaction  . Septra [Sulfamethoxazole-Trimethoprim] Rash    Rash, cyst  . Sulfa Antibiotics Rash    Antimicrobials this admission: 9/21 Cefepime >>  9/20 Vancomycin >>   Dose adjustments this  admission: none  Microbiology results: 9/20 MRSA PCR: negative  Thank you for allowing pharmacy to be a part of this patient's care.  Arrie Senate, PharmD PGY-2 Cardiology Pharmacy Resident Pager: [phone] 05/10/2017

## 2017-05-10 NOTE — Progress Notes (Signed)
ANTICOAGULATION CONSULT NOTE - Follow-Up Consult  Pharmacy Consult for heparin Indication: acute aortic occlusion  Allergies  Allergen Reactions  . Carvedilol Other (See Comments)    Heart stops CARDIAC ARREST  . Augmentin [Amoxicillin-Pot Clavulanate] Diarrhea  . Clindamycin/Lincomycin Other (See Comments)    tremors  . Diltiazem Hcl Other (See Comments)    Chest pain;   07/02/14-  Patient has been able to tolerate diltiazem PO as inpatient since 06/29/14 without any adverse reaction.  . Fenofibrate Other (See Comments)    Unknown - [facility] - MAR  . Hctz [Hydrochlorothiazide] Other (See Comments)    Chest pain  . Nisoldipine Other (See Comments)    Chest pain  . Nitroglycerin Other (See Comments)    Unknown - NH MAR  . Propranolol Diarrhea    Chest pain  . Tekturna [Aliskiren Fumarate] Other (See Comments)    Unknown reaction  . Valturna [Aliskiren-Valsartan] Other (See Comments)    Unknown reaction  . Septra [Sulfamethoxazole-Trimethoprim] Rash    Rash, cyst  . Sulfa Antibiotics Rash    Patient Measurements: Height: 5\' 4"  (162.6 cm) Weight: 139 lb 15.9 oz (63.5 kg) IBW/kg (Calculated) : 54.7 Heparin Dosing Weight: 63.5 kg  Vital Signs: Temp: 97 F (36.1 C) (09/21 0351) Temp Source: Axillary (09/21 0351) BP: 112/66 (09/21 0400) Pulse Rate: 91 (09/21 0600)  Labs:  Recent Labs  05/13/2017 1233 05/07/2017 1255  05/03/2017 1924 05/10/17 0030 05/10/17 0459  HGB 15.4* 18.0*  < > 12.9 14.6 13.3  HCT 48.1* 53.0*  < > 38.0 46.6* 42.0  PLT 207  --   --   --  167  --   CREATININE 1.49* 1.30*  --   --  1.43*  --   CKTOTAL  --   --   --   --  14,950*  --   TROPONINI <0.03  --   --   --   --   --   < > = values in this interval not displayed.  Estimated Creatinine Clearance: 23.9 mL/min (A) (by C-G formula based on SCr of 1.43 mg/dL (H)).   Medical History: Past Medical History:  Diagnosis Date  . Acute respiratory failure ([place])    12/2011 admission - decreased O2 sats  on ambulation, improved by time of discharge  . Aortic stenosis    Moderate by echo 4/13 - mean 20 mmHg  . Arthritis    "back" (08/24/2015)  . Atrial fibrillation ([place])    not a coumadin candidate secondary to fall risk  . CAD (coronary artery disease)    minimal plaque by cath 5/12  . CHF (congestive heart failure) (H[facility])    EF 55-60%  . Chronic atrial fibrillation with RVR ([facility])   . Chronic diastolic heart failure ([facility])    Echocardiogram 12/10/11: Moderate LVH, EF 65-70%, moderate aortic stenosis, mean gradient 20, mild MS  . Chronic mid back pain   . DJD (degenerative joint disease) of hip    s/p R THR 10/2010  . DVT (deep venous thrombosis) ([place]) ~ 2012   LLE  . History of blood transfusion 1957   "related to childbirth"  . Hyperlipidemia   . Hypertension   . Insulin pump in place   . Iron deficiency anemia   . Kidney stones ~ 1958   "no OR"  . Migraine    "none since my hysterectomy" (08/24/2015)  . Mitral stenosis    Mild by echo 4/13  . Myocardial infarction ([place]) 12/2010  . PAD (peripheral  artery disease) ([place])    s/p bilateral comon iliac artery stenting in 2002. Known significant  R SFA  disease. carotid dz noted 11/2011 followed by Dr. Bridgett Larsson  . Skin cancer    right forehead/head  . Type II diabetes mellitus ([place]) dx'd 1999  . Venous insufficiency     Assessment: 38 yoF found to have acute occlusion of abdominal aorta on CTA s/p thrombectomy with PEA arrest this morning. Heparin initially infusing post/op at low rate of 500 units/hr with no plans for titration per MD, now to transition to full dosing per pharmacy. Per RN, heparin has been off for several hours with recent code blue, so no heparin level drawn this morning.  Goal of Therapy:  Heparin level 0.3-0.7 units/ml Monitor platelets by anticoagulation protocol: Yes   Plan:  -Heparin 1000 units/hr -Check 8-hr heparin level -Monitor heparin level, CBC, S/Sx bleeding  Arrie Senate, PharmD PGY-2 Cardiology  Pharmacy Resident Pager: [phone] 05/10/2017

## 2017-05-10 NOTE — Progress Notes (Signed)
CRITICAL VALUE ALERT  Critical Value:  Lactic Acid 8.2  Date & Time Notied:  05/10/2017  0950  Provider Notified: Dr. Tonny Branch  Orders Received/Actions taken: Continue to monitor pt

## 2017-05-10 NOTE — Progress Notes (Signed)
Inpatient Diabetes Program Recommendations  AACE/ADA: New Consensus Statement on Inpatient Glycemic Control (2015)  Target Ranges:  Prepandial:   less than 140 mg/dL      Peak postprandial:   less than 180 mg/dL (1-2 hours)      Critically ill patients:  140 - 180 mg/dL   Results for Emma Yu, Emma Yu (MRN [id number]) as of 05/10/2017 07:59  Ref. Range 05/10/2017 14:25 05/08/2017 15:36 04/26/2017 17:11 05/17/2017 20:27 05/06/2017 22:14 05/04/2017 22:51 05/10/2017 00:00 05/10/2017 01:00 05/10/2017 01:54 05/10/2017 02:53 05/10/2017 03:53 05/10/2017 05:13  Glucose-Capillary Latest Ref Range: 65 - 99 mg/dL 480 (H) 431 (H) 298 (H) 151 (H) 176 (H) 164 (H) 139 (H) 127 (H) 113 (H) 105 (H) 139 (H) 133 (H)    Home DM Meds: Insulin Pump        Uses 0.9 units/hour for basal rate       Patient is supposed to be giving 5 units bolus prior to meals when CBG >100 mg/dl        Correction bolus of Humalog for CBGs >200 mg/dl using Bolus Wizard feature on Insulin Pump       (see note from Leonia Reader, CDE on 03/04/17)  Current Insulin Orders: None       MD- Please continue IV Insulin drip until patient stabilizes post Cardiac Arrest this AM.  Once patient is more stable and her BMET is OK, can transition to SQ Insulin if desired by the MD.  Patient gets about 22 units basal insulin on her pump in a 24 hour period.  Recommend Lantus 20 units daily + Novolog Sensitive Correction Scale/ SSI (0-9 units) Q4 hours when patient ready to transition to SQ Insulin     --Will follow patient during hospitalization--  Wyn Quaker RN, MSN, CDE Diabetes Coordinator Inpatient Glycemic Control Team Team Pager: [phone] (8a-5p)

## 2017-05-10 NOTE — Progress Notes (Signed)
Glucostabilizer noted to be set to BG parameters 100-140. Orders specify parameters 140-180. Settings changed to match order in [employer].  Will continue to monitor.

## 2017-05-10 NOTE — Consult Note (Signed)
PULMONARY / CRITICAL CARE MEDICINE   Name: Emma Yu MRN: [id number] DOB: 1927-02-23    ADMISSION DATE:  05/04/2017 CONSULTATION DATE:  05/10/2017  REFERRING MD:  Dr. Donzetta Matters  CHIEF COMPLAINT:  Respiratory Distress  HISTORY OF PRESENT ILLNESS:   83 year old female with PMH of Afib not on anticoagulation (due to fall risk), previous bilateral iliac artery stenting, CKD, CAD, dHF, HTN, HLD, AS, and DM on insulin gtt, admitted on 9/20 with acute aortic occlusion.  She was in her usual state of health, independent in [place], lives with son, until 9/20 and could not stand up after using bathroom.  On arrival to ED, she could not move or feel her legs.  She was emergently taken to the OR per Dr. Donzetta Matters with aortogram and aortoiliac thromboembolectomy performed.  She was extubated postoperatively and taken to the ICU.  She was noted to have pink tinged urine and progressive decreased UO, hypotension, and hypoxia requiring NRB and fluid bolus.  PCCM consulted.    PAST MEDICAL HISTORY :  She  has a past medical history of Acute respiratory failure ([place]); Aortic stenosis; Arthritis; Atrial fibrillation ([place]); CAD (coronary artery disease); CHF (congestive heart failure) ([place]); Chronic atrial fibrillation with RVR ([place]); Chronic diastolic heart failure ([place]); Chronic mid back pain; DJD (degenerative joint disease) of hip; DVT (deep venous thrombosis) ([place]) (~ 2012); History of blood transfusion (1957); Hyperlipidemia; Hypertension; Insulin pump in place; Iron deficiency anemia; Kidney stones (~ 1958); Migraine; Mitral stenosis; Myocardial infarction [facility]) (12/2010); PAD (peripheral artery disease) ([place]); Skin cancer; Type II diabetes mellitus ([place]) (dx'd 1999); and Venous insufficiency.  PAST SURGICAL HISTORY: She  has a past surgical history that includes Total hip arthroplasty (Right, 1991); Lumbar disc surgery (1999); Cardiac catheterization (01/10/2011); Abdominal hysterectomy; Cataract extraction,  bilateral (Bilateral, 2015); Tonsillectomy; Back surgery; Cholecystectomy open; Appendectomy; Joint replacement; Revision total hip arthroplasty (1994); Iliac artery stent (Bilateral, 2002); and Skin cancer excision (2016).  Allergies  Allergen Reactions  . Carvedilol Other (See Comments)    Heart stops CARDIAC ARREST  . Augmentin [Amoxicillin-Pot Clavulanate] Diarrhea  . Clindamycin/Lincomycin Other (See Comments)    tremors  . Diltiazem Hcl Other (See Comments)    Chest pain;   07/02/14-  Patient has been able to tolerate diltiazem PO as inpatient since 06/29/14 without any adverse reaction.  . Fenofibrate Other (See Comments)    Unknown - [facility] - MAR  . Hctz [Hydrochlorothiazide] Other (See Comments)    Chest pain  . Nisoldipine Other (See Comments)    Chest pain  . Nitroglycerin Other (See Comments)    Unknown - [facility] MAR  . Propranolol Diarrhea    Chest pain  . Tekturna [Aliskiren Fumarate] Other (See Comments)    Unknown reaction  . Valturna [Aliskiren-Valsartan] Other (See Comments)    Unknown reaction  . Septra [Sulfamethoxazole-Trimethoprim] Rash    Rash, cyst  . Sulfa Antibiotics Rash    No current facility-administered medications on file prior to encounter.    Current Outpatient Prescriptions on File Prior to Encounter  Medication Sig  . acetaminophen (TYLENOL) 500 MG tablet Take 500-1,000 mg by mouth every 6 (six) hours as needed (for pain).   Marland Kitchen amLODipine (NORVASC) 5 MG tablet Take 5 mg by mouth daily.  Marland Kitchen aspirin EC 81 MG tablet Take 81 mg by mouth 2 (two) times daily.  Marland Kitchen b complex vitamins tablet Take 1 tablet by mouth daily.    . calcium-vitamin D (OSCAL WITH D) 500-200 MG-UNIT tablet Take 1  tablet by mouth 2 (two) times daily.  . Cholecalciferol (VITAMIN D3) 2000 UNITS capsule Take 2,000 Units by mouth daily.    Marland Kitchen gabapentin (NEURONTIN) 300 MG capsule TAKE 2 CAPSULES BY MOUTH AT BEDTIME  . insulin lispro (HUMALOG) 100 UNIT/ML injection Inject into the skin  continuous. INSULIN PUMP  . levocetirizine (XYZAL) 5 MG tablet Take 1 tablet (5 mg total) by mouth every evening.  . metoprolol tartrate (LOPRESSOR) 100 MG tablet Take 1 tablet (100 mg total) by mouth 2 (two) times daily.  Marland Kitchen omeprazole (PRILOSEC) 40 MG capsule TAKE 1 CAPSULE BY MOUTH  DAILY BEFORE BREAKFAST  . potassium chloride SA (K-DUR,KLOR-CON) 20 MEQ tablet Take 20 mEq by mouth 2 (two) times daily.   . pravastatin (PRAVACHOL) 20 MG tablet Take 20 mg by mouth daily.  . Probiotic Product (PROBIOTIC PO) Take 1 tablet by mouth daily at 10 pm.  . torsemide (DEMADEX) 20 MG tablet Take 1 tablet (20 mg total) by mouth 2 (two) times daily.  . traMADol (ULTRAM) 50 MG tablet TAKE 2 TABLETS BY MOUTH EVERY 6 HOURS AS NEEDED FOR PAIN  . Continuous Blood Gluc Sensor (FREESTYLE LIBRE SENSOR SYSTEM) MISC 1 Device by Does not apply route as directed. 1 device every 10 days  . glucose blood (ONE TOUCH ULTRA TEST) test strip 1 each by Other route 4 (four) times daily. And lancets 4/day    FAMILY HISTORY:  Her indicated that her mother is deceased. She indicated that her father is deceased. She indicated that her maternal grandmother is deceased. She indicated that her maternal grandfather is deceased. She indicated that her paternal grandmother is deceased. She indicated that her paternal grandfather is deceased. She indicated that the status of her other is unknown.    SOCIAL HISTORY: She  reports that she quit smoking about 31 years ago. Her smoking use included Cigarettes. She has a 74.00 pack-year smoking history. She has never used smokeless tobacco. She reports that she does not drink alcohol or use drugs.  REVIEW OF SYSTEMS:   Unable   SUBJECTIVE:  Patient complaining of abd pain.   S/p 500 NS bolus  VITAL SIGNS: BP 112/66   Pulse (!) 115   Temp (!) 97 F (36.1 C) (Axillary)   Resp (!) 32   Ht 5\' 4"  (1.626 m)   Wt 139 lb 15.9 oz (63.5 kg)   LMP  (LMP Unknown)   SpO2 100%   BMI 24.03  kg/m   HEMODYNAMICS:    VENTILATOR SETTINGS:    INTAKE / OUTPUT: I/O last 3 completed shifts: In: 800 [I.V.:800] Out: 600 [Urine:400; Blood:200]  PHYSICAL EXAMINATION: General:  Elderly adult female sitting up in bed in distress HEENT: MM pink/ dry, PERRL, no jvd Neuro: Lethargic, speaks few words, f/c, able to wiggle toes CV: IRIR, 120's, weak radial pulses PULM: shallow, rapid, labored w/abd muscle use, lungs bilaterally clear, diminished in bases GI: distended, +BS, +bs, bilateral groin sites with dressings, Left groin > R with  Extremities: mottling, chronic venous statis in BLE, no edema   LABS:  BMET  Recent Labs Lab 05/08/2017 1233 05/12/2017 1255 05/08/2017 1916 05/12/2017 1924 05/10/17 0030  NA 138 142 144 144 140  K 4.5 4.6 5.2* 5.2* 4.5  CL 99* 103  --   --  103  CO2 21*  --   --   --  23  BUN 46* 51*  --   --  49*  CREATININE 1.49* 1.30*  --   --  1.43*  GLUCOSE 460* 488* 165*  --  129*    Electrolytes  Recent Labs Lab 05/07/2017 1233 05/10/17 0030  CALCIUM 10.3 9.1    CBC  Recent Labs Lab 04/20/2017 1233  04/22/2017 1924 05/10/17 0030 05/10/17 0459  WBC 14.6*  --   --  17.7*  --   HGB 15.4*  < > 12.9 14.6 13.3  HCT 48.1*  < > 38.0 46.6* 42.0  PLT 207  --   --  167  --   < > = values in this interval not displayed.  Coag's No results for input(s): APTT, INR in the last 168 hours.  Sepsis Markers  Recent Labs Lab 04/21/2017 1256  LATICACIDVEN 6.25*    ABG  Recent Labs Lab 04/27/2017 1924  PHART 7.393  PCO2ART 45.8  PO2ART 107.0    Liver Enzymes  Recent Labs Lab 05/02/2017 1233  AST 61*  ALT 39  ALKPHOS 157*  BILITOT 1.0  ALBUMIN 3.9    Cardiac Enzymes  Recent Labs Lab 04/27/2017 1233  TROPONINI <0.03    Glucose  Recent Labs Lab 05/10/17 0000 05/10/17 0100 05/10/17 0154 05/10/17 0253 05/10/17 0353 05/10/17 0513  GLUCAP 139* 127* 113* 105* 139* 133*    Imaging Ct Angio Aortobifemoral W And/or Wo  Contrast  Result Date: 04/27/2017 CLINICAL DATA:  83 year old with with new leg symptoms. Mottled appearance of the legs. Unable to feel femoral pulses. History of bilateral iliac stents. EXAM: CT ANGIOGRAPHY CHEST, ABDOMEN, PELVIS AND RUNOFF TECHNIQUE: Multidetector CT imaging through the chest, abdomen, pelvis and runoff was performed using the standard protocol during bolus administration of intravenous contrast. Multiplanar reconstructed images and MIPs were obtained and reviewed to evaluate the vascular anatomy. CONTRAST:  80 mL Isovue 370 COMPARISON:  Chest CT 05/31/2016 FINDINGS: CTA CHEST FINDINGS Cardiovascular: Atherosclerotic calcifications in the thoracic aorta without aneurysm. Great vessels are patent. Central pulmonary arteries are patent. Extensive motion artifact in the chest. Prominent mitral annular calcifications. Mediastinum/Nodes: Evidence for small thyroid nodules. Multiple small lymph nodes throughout the mediastinum which are not well characterized on this examination but minimal change from the prior examination. Lungs/Pleura: Trachea and mainstem bronchi are patent. Trace right pleural fluid. Patchy densities throughout the lower lobes bilaterally. Small amount of ground-glass disease in the upper lobes bilaterally. Musculoskeletal: No acute abnormality. Review of the MIP images confirms the above findings. CTA ABDOMEN AND PELVIS AND RUNOFF FINDINGS VASCULAR Aorta: Occlusion of the abdominal aorta just below the IMA. Atherosclerotic calcifications in abdominal aorta without aneurysm. Celiac: Patent without evidence of aneurysm, dissection, vasculitis or significant stenosis. SMA: Atherosclerotic calcifications at the origin the SMA without significant stenosis. SMA is patent. Renals: A critical stenosis at the origin of the left renal artery. Main right renal artery is patent with mild-to-moderate stenosis at the origin. IMA: IMA is patent. Inflow: Patient has bilateral common iliac  artery stents which are completely occluded. There is minor reconstitution of the right internal iliac artery. Minimal flow in the right external iliac artery. Some reconstitution in the left internal iliac artery branches but limited evaluation. No significant flow in the left external iliac artery. Right lower extremity: There may be some contrast in the proximal right profunda femoral arteries but limited evaluation due to the artifact from the adjacent right hip replacement. No contrast identified in the right SFA. No contrast identified in the right popliteal artery or right runoff vessels. Left lower extremity: There appears to be minor reconstitution in left common femoral artery. There is reconstitution  in the deep left femoral arteries. No significant contrast in the left SFA or left popliteal artery. No significant contrast in the runoff vessels. Veins: No obvious venous abnormality within the limitations of this arterial phase study. Review of the MIP images confirms the above findings. NON-VASCULAR Hepatobiliary: No acute abnormality in the liver. Gallbladder appears to be surgically absent. Pancreas: Normal appearance of the pancreas without inflammation or duct dilatation. Spleen: Normal appearance of spleen without enlargement. Adrenals/Urinary Tract: Adrenal glands are unremarkable. Scarring in the left kidney upper pole. Decreased enhancement in the right kidney lower pole probably related to compromised arterial flow. Question an accessory right renal artery which is occluded from the aorta. Small calcification in the right kidney lower pole. Urinary bladder is distended. Stomach/Bowel: Extensive diverticulosis in the sigmoid colon. Suspect a right hemicolectomy. No evidence for bowel obstruction or focal bowel inflammation. Moderate distention of the stomach. Lymphatic: No significant lymph node enlargement in the abdomen or pelvis. Reproductive: Status post hysterectomy. No adnexal masses. Other:  No free fluid.  No free air. Musculoskeletal: Right hip replacement is located. No acute bone abnormality. Review of the MIP images confirms the above findings. IMPRESSION: Occlusion of the distal abdominal aorta just below the inferior mesenteric artery. Bilateral common iliac arteries are occluded. Limited evaluation of the blood flow to the lower extremities due to the slow arterial flow and timing of the study. Infarct involving the right kidney lower pole. Critical stenosis and near occlusion of the left renal artery. Patchy lung disease bilaterally particularly in the posterior lower lobes. Some this could be related to atelectasis or asymmetric pulmonary edema. Infectious or inflammatory process cannot be completely excluded. Trace right pleural fluid. These results were called by telephone at the time of interpretation on 05/19/2017 at 3:28 pm to Dr. Jola Schmidt , who verbally acknowledged these results. Electronically Signed   By: Markus Daft M.D.   On: 04/26/2017 15:54   Dg Chest Portable 1 View  Result Date: 04/30/2017 CLINICAL DATA:  preop vascular surgery. Central line placement EXAM: PORTABLE CHEST 1 VIEW COMPARISON:  Chest x-ray dated 07/06/2016. Chest CT from earlier same day. FINDINGS: Cardiomegaly is stable.  Aortic atherosclerosis. Patchy bibasilar airspace opacities. Stable interstitial opacities within the upper lobes. IMPRESSION: 1. Patchy bibasilar airspace opacities, compatible with the patchy bibasilar consolidations seen on chest CT earlier today, atelectasis versus pneumonia, possible component of aspiration. 2. Cardiomegaly. Additional interstitial thickening within the upper lobe suggests mild CHF/volume overload. No evidence of overt alveolar pulmonary edema. 3. Aortic atherosclerosis. Electronically Signed   By: Franki Cabot M.D.   On: 05/03/2017 20:57   Ct Angio Chest/abd/pel For Dissection W And/or Wo Contrast  Result Date: 05/06/2017 CLINICAL DATA:  83 year old with with new  leg symptoms. Mottled appearance of the legs. Unable to feel femoral pulses. History of bilateral iliac stents. EXAM: CT ANGIOGRAPHY CHEST, ABDOMEN, PELVIS AND RUNOFF TECHNIQUE: Multidetector CT imaging through the chest, abdomen, pelvis and runoff was performed using the standard protocol during bolus administration of intravenous contrast. Multiplanar reconstructed images and MIPs were obtained and reviewed to evaluate the vascular anatomy. CONTRAST:  80 mL Isovue 370 COMPARISON:  Chest CT 05/31/2016 FINDINGS: CTA CHEST FINDINGS Cardiovascular: Atherosclerotic calcifications in the thoracic aorta without aneurysm. Great vessels are patent. Central pulmonary arteries are patent. Extensive motion artifact in the chest. Prominent mitral annular calcifications. Mediastinum/Nodes: Evidence for small thyroid nodules. Multiple small lymph nodes throughout the mediastinum which are not well characterized on this examination but minimal change from  the prior examination. Lungs/Pleura: Trachea and mainstem bronchi are patent. Trace right pleural fluid. Patchy densities throughout the lower lobes bilaterally. Small amount of ground-glass disease in the upper lobes bilaterally. Musculoskeletal: No acute abnormality. Review of the MIP images confirms the above findings. CTA ABDOMEN AND PELVIS AND RUNOFF FINDINGS VASCULAR Aorta: Occlusion of the abdominal aorta just below the IMA. Atherosclerotic calcifications in abdominal aorta without aneurysm. Celiac: Patent without evidence of aneurysm, dissection, vasculitis or significant stenosis. SMA: Atherosclerotic calcifications at the origin the SMA without significant stenosis. SMA is patent. Renals: A critical stenosis at the origin of the left renal artery. Main right renal artery is patent with mild-to-moderate stenosis at the origin. IMA: IMA is patent. Inflow: Patient has bilateral common iliac artery stents which are completely occluded. There is minor reconstitution of the  right internal iliac artery. Minimal flow in the right external iliac artery. Some reconstitution in the left internal iliac artery branches but limited evaluation. No significant flow in the left external iliac artery. Right lower extremity: There may be some contrast in the proximal right profunda femoral arteries but limited evaluation due to the artifact from the adjacent right hip replacement. No contrast identified in the right SFA. No contrast identified in the right popliteal artery or right runoff vessels. Left lower extremity: There appears to be minor reconstitution in left common femoral artery. There is reconstitution in the deep left femoral arteries. No significant contrast in the left SFA or left popliteal artery. No significant contrast in the runoff vessels. Veins: No obvious venous abnormality within the limitations of this arterial phase study. Review of the MIP images confirms the above findings. NON-VASCULAR Hepatobiliary: No acute abnormality in the liver. Gallbladder appears to be surgically absent. Pancreas: Normal appearance of the pancreas without inflammation or duct dilatation. Spleen: Normal appearance of spleen without enlargement. Adrenals/Urinary Tract: Adrenal glands are unremarkable. Scarring in the left kidney upper pole. Decreased enhancement in the right kidney lower pole probably related to compromised arterial flow. Question an accessory right renal artery which is occluded from the aorta. Small calcification in the right kidney lower pole. Urinary bladder is distended. Stomach/Bowel: Extensive diverticulosis in the sigmoid colon. Suspect a right hemicolectomy. No evidence for bowel obstruction or focal bowel inflammation. Moderate distention of the stomach. Lymphatic: No significant lymph node enlargement in the abdomen or pelvis. Reproductive: Status post hysterectomy. No adnexal masses. Other: No free fluid.  No free air. Musculoskeletal: Right hip replacement is located.  No acute bone abnormality. Review of the MIP images confirms the above findings. IMPRESSION: Occlusion of the distal abdominal aorta just below the inferior mesenteric artery. Bilateral common iliac arteries are occluded. Limited evaluation of the blood flow to the lower extremities due to the slow arterial flow and timing of the study. Infarct involving the right kidney lower pole. Critical stenosis and near occlusion of the left renal artery. Patchy lung disease bilaterally particularly in the posterior lower lobes. Some this could be related to atelectasis or asymmetric pulmonary edema. Infectious or inflammatory process cannot be completely excluded. Trace right pleural fluid. These results were called by telephone at the time of interpretation on 05/08/2017 at 3:28 pm to Dr. Jola Schmidt , who verbally acknowledged these results. Electronically Signed   By: Markus Daft M.D.   On: 04/29/2017 15:54    STUDIES:  9/20 CTA ABD/AO/BIFEM >>  Occlusion of the distal abdominal aorta just below the inferior mesenteric artery. Bilateral common iliac arteries are occluded.  Limited evaluation of  the blood flow to the lower extremities due to the slow arterial flow and timing of the study. Infarct involving the right kidney lower pole. Critical stenosis and near occlusion of the left renal artery.  Patchy lung disease bilaterally particularly in the posterior lower lobes. Some this could be related to atelectasis or asymmetric pulmonary edema. Infectious or inflammatory process cannot be completely excluded. Trace right pleural fluid.  CULTURES: 9/20 MRSA PCR > neg  ANTIBIOTICS: 9/20 Vanc >>  SIGNIFICANT EVENTS: 9/20 Admitted, OR, extubated 9/21 Intubated, Cardiac Arrest x 76mins  LINES/TUBES: 9/20 R IJ TL CVC >> 9/20 Foley  DISCUSSION: 60 yoF w/previous previous bilateral iliac artery stenting (2002) who presented on 9/20 unable to walk, move, or feel bilateral legs found to have an acute aortic  occulusion taken to the OR for aortoiliac thromboembolectomy.  Extubated post-op but progressively became hypotensive and hypoxic who required emergent intubation and subsequently suffered PEA/ VT cardiac arrest x 5 mins.     ASSESSMENT / PLAN:  PULMONARY A: Acute hypoxic respiratory failure P:   S/p intubation CXR now  ABG now Full vent support VAP measures   CARDIOVASCULAR A:  PEA/VT arrest x 5 mins  Acute Aortic Occulusion s/p aortoiliac thromboembolectomy (05/16/2017)- r/o compartment syndrome Shock- cardiogenic Afib Hx HTN, HLD, CAD, dHF, mod AS, mild MS, PAD P:  Tele monitoring Levophed for MAP > 65 EKG now, trend troponin  Trend lactate Trend CVP TTE Check bladder pressure r/o abd compartment syndrome Remainder per Vascular Heparin per Vascular Family advised by Vascular of poor prognosis prior to surgery, very high risk of surgery, given age and co-morbidities   RENAL A:   Rhabdomyolysis  AKI, possible ATN ? Right renal infarct per CTA P:   S/p NS bolus 555ml NS at 173ml/hr  Trend CK q8 hr Trend BMP /mag/phos/  Strict I/Os Replace electrolytes as indicated Consider Nephrology consult   GASTROINTESTINAL A:   NPO P:   PPI for SUP NPO Place NGT  HEMATOLOGIC A:   Mild Leukocytosis- likely reactive Hx IDA P:  Trend CBC Systemic heparin as above  Trend coags  INFECTIOUS A:   No obvious source P:   Monitor clinically  Trend WBC/ monitor fever curve  ENDOCRINE A:   DM on insulin pump P:   Continue insulin gtt Q1 hour CBGs  NEUROLOGIC A:   Lethargic prior to intubation, s/p cardiac arrest x 5 mins P:   RASS goal: -1/-2 Prn Fentanyl/ Versed DWA Consider CT head if neuro status poor post rocuronium  TTM deffered due to hemodynamic instability/ overall poor prognosis    FAMILY  - Updates: Family contacted by RN and are on their way.   - Inter-disciplinary family meet or Palliative Care meeting due by:  05/17/17  CCT 60 mins    Emma Yu, ACNP Pulmonary and [place] Pager: [phone]  05/10/2017, 5:44 AM ..STAFF NOTE  I, Dr Seward Carol have personally reviewed patient's available data, including medical history, events of note, physical examination and test results as part of my evaluation. I have discussed with ACNP Emma Yu and other care providers such as Vascular Surgeon, pharmacist, RN and RRT.  In addition,  I personally evaluated patient. I agree with ACNP Simpson's note including her assessment and plan  Pt is a 83 yr old female with a PMHx of CHF EF 55%, CAD, Afib, h/o DVT, HLD, Aortic Stenosis, HTN, PAD, MI, Type 2 DM, history of bilateral common iliac artery stents and also  has history of AFib was not on anticoagulation as an outpatient. She presents on this admission with bilateral lower extremity pain and weakness and was found on CT angiogram to have an occluded aorta that appeared acute. POD 0 Exposure of bilateral common femoral arteries with Aortoiliac thromboembolectomy and Aortogram. per the findings both CFAs were calcified L>R significant clot proximally from the aortoiliac segment was retrieved. Pt was extubated post op and sent to ICU initially awake and on nasal cannula. >6 hrs post called to bedside due to labored shallow breathing and desaturation. on exam no appreciable wheezing but decreased breath sounds at the bases. Pt tender in abdomen which was soft but distended with distended flanks. Dopplers checked at both extremities and pulse still present. HR was irregular and waveform had ST elevations. during my exam pt became less responsive. Not a candidate for NIPPV. On NRB mask and still desaturating emergent intubation high risk given her clinical history and hemodynamics. Pt received IVFbolus and started on Levophed prior to sedative medications. Able to intubate patient with a size 7 ETT. Despite efforts to keep patient hemodynamically stable during  intubation patient became hypotensive went into PEA arrest CODE called at 6:07AM  lasted 5 mins total meds received: 2 epi pushes and 1 bicarb. Rhythm at 3 mins into code VTach received shock followed by  compressions ROSC achieved. Vasc Surgery aware. Acute aortic occlusion s/p Exposure of bilateral common femoral arteries with Aortoiliac thromboembolectomy and Aortogram guarded prognosis high risk procedure. discussed with Vascular surgery and concern for ACS will starte her on full dose heparin, Vasc Sx discussed with family regarding poor prognosis and high risk of procedure and patient. Continue hemodynamic monitoring Pt currently on Levophed and IVF at 25 will avoid propofol for sedation due to elevated triglycerides and will give fentanyl per protocol. when hemodynamically stable assess her mental status if still altered or not waking up consider CTHead. Neurochecks Q 4. Intubated on PRVC will obtain ABG and adjust vent based on findings. Currently on PEEP 10. Sat 97%. Left kidney infarcted ( per recent Ct scan and findings in OR) hematuria in foley elevated CK Rhabdomyolysis pt is oliguric will continue IVF at 125cc and monitor UOP. not an ideal candidate for HD but still recommend consult to Renal. All other issues as addressed in NP Simpson's note.     The patient is critically ill with multiple organ systems failure and requires high complexity decision making for assessment and support, frequent evaluation and titration of therapies, application of advanced monitoring technologies and extensive interpretation of multiple databases. Critical Care Time devoted to patient care services described in this note is 65 Minutes. This time reflects time of care of this signee Dr Seward Carol. This critical care time does not reflect procedure time, or teaching time or supervisory time of PA/NP/Med student/Med Resident etc but could involve care discussion time   Prognosis: Poor Code Status: Full   Critical Care Time: 65 mins NOK: Son  Dr. Seward Carol [facility] Critical Care Medicine  05/10/2017 7:03 AM

## 2017-05-10 NOTE — Progress Notes (Signed)
   I discussed patient and expected outcomes with son. At this time he does not want her to have any more chest compressions or escalate care given low likelihood of survival. Will make dnr. No further heparin given concern for gi bleed.   Brandon C. Donzetta Matters, MD [facility] [place] Office: [phone] Pager: [phone]

## 2017-05-10 NOTE — Progress Notes (Signed)
  Progress Note    05/10/2017 7:21 AM 1 Day Post-Op  Subjective:  Recently intubated and sedated  Vitals:   05/10/17 0400 05/10/17 0600  BP: 112/66   Pulse: (!) 115 91  Resp: (!) 32 12  Temp:    SpO2: 100% 100%    Physical Exam: Intubated and sedated Abdomen remains protuberant but soft Bilateral groins with mild hematoma Weak bilateral dp signals  CBC    Component Value Date/Time   WBC 17.7 (H) 05/10/2017 0030   RBC 4.76 05/10/2017 0030   HGB 13.3 05/10/2017 0459   HCT 42.0 05/10/2017 0459   PLT 167 05/10/2017 0030   MCV 97.9 05/10/2017 0030   MCH 30.7 05/10/2017 0030   MCHC 31.3 05/10/2017 0030   RDW 16.4 (H) 05/10/2017 0030   LYMPHSABS 1.9 08/28/2016 1433   MONOABS 1.0 08/28/2016 1433   EOSABS 0.2 08/28/2016 1433   BASOSABS 0.0 08/28/2016 1433    BMET    Component Value Date/Time   NA 140 05/10/2017 0030   NA 136 (A) 08/22/2016   K 4.5 05/10/2017 0030   CL 103 05/10/2017 0030   CO2 23 05/10/2017 0030   GLUCOSE 129 (H) 05/10/2017 0030   BUN 49 (H) 05/10/2017 0030   BUN 49 (A) 08/22/2016   CREATININE 1.43 (H) 05/10/2017 0030   CREATININE 1.66 (H) 01/01/2017 1138   CALCIUM 9.1 05/10/2017 0030   GFRNONAA 32 (L) 05/10/2017 0030   GFRAA 37 (L) 05/10/2017 0030    INR    Component Value Date/Time   INR 1.18 12/25/2011 0953     Intake/Output Summary (Last 24 hours) at 05/10/17 0721 Last data filed at 05/10/17 0500  Gross per 24 hour  Intake          2085.23 ml  Output             1185 ml  Net           900.23 ml     Assessment:  83 y.o. female is s/p aortoiliac embolectomy for acute occlusion of aorta.   Plan: Do not suspect she is bleeding and will restart heparin drip. I have spoken with son and given him news of her recent code event and he is on his way. Appreciate ccm care of patient.   Brandon C. Donzetta Matters, MD [facility] [place] Office: [phone] Pager: [phone]  05/10/2017 7:21 AM

## 2017-05-10 NOTE — Progress Notes (Signed)
Dr. Ashok Cordia notified of increased heart rate to 140s. Pt blood pressure remaining stable at this time.  Dr. Ashok Cordia to contact Dr. Jimmey Ralph.  Will continue to monitor pt closely.

## 2017-05-11 DIAGNOSIS — R579 Shock, unspecified: Secondary | ICD-10-CM

## 2017-05-11 DIAGNOSIS — Z978 Presence of other specified devices: Secondary | ICD-10-CM

## 2017-05-11 DIAGNOSIS — I7410 Embolism and thrombosis of unspecified parts of aorta: Secondary | ICD-10-CM

## 2017-05-11 DIAGNOSIS — Z9889 Other specified postprocedural states: Secondary | ICD-10-CM

## 2017-05-11 LAB — GLUCOSE, CAPILLARY
GLUCOSE-CAPILLARY: 125 mg/dL — AB (ref 65–99)
GLUCOSE-CAPILLARY: 143 mg/dL — AB (ref 65–99)
GLUCOSE-CAPILLARY: 111 mg/dL — AB (ref 65–99)
GLUCOSE-CAPILLARY: 102 mg/dL — AB (ref 65–99)
Glucose-Capillary: 123 mg/dL — ABNORMAL HIGH (ref 65–99)
Glucose-Capillary: 123 mg/dL — ABNORMAL HIGH (ref 65–99)
Glucose-Capillary: 127 mg/dL — ABNORMAL HIGH (ref 65–99)
Glucose-Capillary: 144 mg/dL — ABNORMAL HIGH (ref 65–99)
Glucose-Capillary: 199 mg/dL — ABNORMAL HIGH (ref 65–99)

## 2017-05-11 LAB — CBC
HEMATOCRIT: 35.9 % — AB (ref 36.0–46.0)
Hemoglobin: 10.8 g/dL — ABNORMAL LOW (ref 12.0–15.0)
MCH: 31 pg (ref 26.0–34.0)
MCHC: 30.1 g/dL (ref 30.0–36.0)
MCV: 103.2 fL — AB (ref 78.0–100.0)
PLATELETS: 115 10*3/uL — AB (ref 150–400)
RBC: 3.48 MIL/uL — AB (ref 3.87–5.11)
RDW: 16.8 % — ABNORMAL HIGH (ref 11.5–15.5)
WBC: 17.8 10*3/uL — AB (ref 4.0–10.5)

## 2017-05-11 LAB — POCT I-STAT 3, ART BLOOD GAS (G3+)
ACID-BASE DEFICIT: 28 mmol/L — AB (ref 0.0–2.0)
BICARBONATE: 4.6 mmol/L — AB (ref 20.0–28.0)
O2 SAT: 96 %
PCO2 ART: 27.6 mmHg — AB (ref 32.0–48.0)
PH ART: 6.82 — AB (ref 7.350–7.450)
TCO2: 5 mmol/L — AB (ref 22–32)
pO2, Arterial: 148 mmHg — ABNORMAL HIGH (ref 83.0–108.0)

## 2017-05-11 LAB — PROCALCITONIN: Procalcitonin: 17.12 ng/mL

## 2017-05-11 MED ORDER — DEXTROSE 10 % IV SOLN: INTRAVENOUS | Status: DC | PRN

## 2017-05-11 MED ORDER — CHLORHEXIDINE GLUCONATE 0.12% ORAL RINSE (MEDLINE KIT): 15.0000 mL | Freq: Two times a day (BID) | OROMUCOSAL | Status: DC

## 2017-05-11 MED ORDER — EPINEPHRINE PF 1 MG/ML IJ SOLN
0.5000 ug/min | INTRAVENOUS | Status: AC
  Administered 2017-05-11: 13 ug/min via INTRAVENOUS
  Filled 2017-05-11: qty 4

## 2017-05-11 MED ORDER — SODIUM BICARBONATE 8.4 % IV SOLN
INTRAVENOUS | Status: DC
  Administered 2017-05-11: 09:00:00 via INTRAVENOUS
  Filled 2017-05-11 (×4): qty 150

## 2017-05-11 MED ORDER — CHLORHEXIDINE GLUCONATE CLOTH 2 % EX PADS: 6.0000 | MEDICATED_PAD | Freq: Every day | CUTANEOUS | Status: DC

## 2017-05-11 MED ORDER — INSULIN GLARGINE 100 UNIT/ML ~~LOC~~ SOLN
10.0000 [IU] | SUBCUTANEOUS | Status: DC
  Administered 2017-05-11: 10 [IU] via SUBCUTANEOUS
  Filled 2017-05-11 (×2): qty 0.1

## 2017-05-11 MED ORDER — ORAL CARE MOUTH RINSE
15.0000 mL | OROMUCOSAL | Status: DC
  Administered 2017-05-11 (×3): 15 mL via OROMUCOSAL

## 2017-05-12 LAB — CULTURE, RESPIRATORY W GRAM STAIN: Culture: NO GROWTH

## 2017-05-12 LAB — CULTURE, RESPIRATORY

## 2017-05-13 LAB — TYPE AND SCREEN
ABO/RH(D): O NEG
ANTIBODY SCREEN: NEGATIVE
UNIT DIVISION: 0
UNIT DIVISION: 0
UNIT DIVISION: 0
UNIT DIVISION: 0

## 2017-05-13 LAB — BPAM RBC
BLOOD PRODUCT EXPIRATION DATE: 201810082359
BLOOD PRODUCT EXPIRATION DATE: 201810122359
BLOOD PRODUCT EXPIRATION DATE: 201810122359
Blood Product Expiration Date: 201810082359
ISSUE DATE / TIME: 201809201805
ISSUE DATE / TIME: 201809201805
ISSUE DATE / TIME: 201809201805
ISSUE DATE / TIME: 201809201805
UNIT TYPE AND RH: 5100
UNIT TYPE AND RH: 9500
Unit Type and Rh: 5100
Unit Type and Rh: 9500

## 2017-05-14 ENCOUNTER — Ambulatory Visit: Payer: Medicare Other | Admitting: Endocrinology

## 2017-05-15 ENCOUNTER — Encounter (HOSPITAL_COMMUNITY): Payer: Self-pay | Admitting: Vascular Surgery

## 2017-05-20 ENCOUNTER — Encounter (HOSPITAL_COMMUNITY): Payer: Self-pay | Admitting: Vascular Surgery

## 2017-05-20 NOTE — Progress Notes (Signed)
    Subjective  - POD #2  Requiring increasing pressors   Physical Exam:  Remains intubated and unresponsive Groins intact Doppler pedal pulses calfs are full but not tense     Assessment/Plan:  POD #2  CV: increasing pressor requirement Pulm:  CCM for vent support VASC:  Do not feel she has a compartment syndrome in either calf, but will need to be monitored Dispo:  Spoke with son regarding end of life issues.  He does not want chest compressions, but is not willing to stop teating his mother.  Discussed poor progrnosis.  Emma Yu May 27, 2017 8:38 AM --  Vitals:   2017/05/27 0700 May 27, 2017 0750  BP: (!) 131/46   Pulse: 99   Resp: (!) 27   Temp:  97.6 F (36.4 C)  SpO2: 100%     Intake/Output Summary (Last 24 hours) at 27-May-2017 0838 Last data filed at 05-27-2017 0700  Gross per 24 hour  Intake          7562.22 ml  Output              516 ml  Net          7046.22 ml     Laboratory CBC    Component Value Date/Time   WBC 17.8 (H) 05/27/17 0328   HGB 10.8 (L) 05-27-2017 0328   HCT 35.9 (L) 2017-05-27 0328   PLT 115 (L) 2017/05/27 0328    BMET    Component Value Date/Time   NA 139 05/10/2017 0859   NA 136 (A) 08/22/2016   K 5.9 (H) 05/10/2017 0859   CL 105 05/10/2017 0859   CO2 20 (L) 05/10/2017 0859   GLUCOSE 321 (H) 05/10/2017 0859   BUN 54 (H) 05/10/2017 0859   BUN 49 (A) 08/22/2016   CREATININE 2.11 (H) 05/10/2017 0859   CREATININE 1.66 (H) 01/01/2017 1138   CALCIUM 8.0 (L) 05/10/2017 0859   GFRNONAA 20 (L) 05/10/2017 0859   GFRAA 23 (L) 05/10/2017 0859    COAG Lab Results  Component Value Date   INR 1.47 05/10/2017   INR 1.18 12/25/2011   INR 1.01 01/10/2011   No results found for: PTT  Antibiotics Anti-infectives    Start     Dose/Rate Route Frequency Ordered Stop   05/10/17 2200  vancomycin (VANCOCIN) IVPB 1000 mg/200 mL premix     1,000 mg 200 mL/hr over 60 Minutes Intravenous Every 24 hours 05/10/17 0945     05/10/17 1000   ceFEPIme (MAXIPIME) 1 g in dextrose 5 % 50 mL IVPB     1 g 100 mL/hr over 30 Minutes Intravenous Every 24 hours 05/10/17 0945     05/10/17 0930  aztreonam (AZACTAM) 1 g in dextrose 5 % 50 mL IVPB  Status:  Discontinued     1 g 100 mL/hr over 30 Minutes Intravenous Every 8 hours 05/10/17 0839 05/10/17 0940   05/04/2017 2300  vancomycin (VANCOCIN) IVPB 1000 mg/200 mL premix  Status:  Discontinued     1,000 mg 200 mL/hr over 60 Minutes Intravenous Every 12 hours 05/12/2017 2159 05/10/17 0941       V. Leia Alf, M.D. [facility] [place] Office: [phone] Pager:  [phone]

## 2017-05-20 NOTE — Progress Notes (Signed)
PULMONARY / CRITICAL CARE MEDICINE   Name: Emma Yu MRN: [id number] DOB: 1927-02-22    ADMISSION DATE:  05/17/2017  HISTORY OF PRESENT ILLNESS:   83 year old female with PMH of Afib not on anticoagulation (due to fall risk), previous bilateral iliac artery stenting, CKD, CAD, dHF, HTN, HLD, AS, and DM on insulin gtt, admitted on 9/20 with acute aortic occlusion.  She was in her usual state of health, independent in [place], lives with son, until 9/20 and could not stand up after using bathroom.  On arrival to ED, she could not move or feel her legs.  She was emergently taken to the OR per Dr. Donzetta Matters with aortogram and aortoiliac thromboembolectomy performed.  She was extubated postoperatively and taken to the ICU.  She was noted to have pink tinged urine and progressive decreased UO, hypotension, and hypoxia requiring NRB and fluid bolus.   REVIEW OF SYSTEMS:   Review of Systems  Unable to perform ROS: Critical illness   VITAL SIGNS: BP (!) 131/46   Pulse 99   Temp 98.1 F (36.7 C) (Axillary)   Resp (!) 27   Ht 5\' 4"  (1.626 m)   Wt 139 lb 15.9 oz (63.5 kg)   LMP  (LMP Unknown)   SpO2 100%   BMI 24.03 kg/m   HEMODYNAMICS: CVP:  [1 mmHg-11 mmHg] 11 mmHg  VENTILATOR SETTINGS: Vent Mode: PRVC FiO2 (%):  [40 %-60 %] 50 % Set Rate:  [20 bmp] 20 bmp Vt Set:  [440 mL] 440 mL PEEP:  [5 cmH20-8 cmH20] 5 cmH20 Plateau Pressure:  [18 cmH20-22 cmH20] 21 cmH20  INTAKE / OUTPUT: I/O last 3 completed shifts: In: 9036.1 [P.O.:1700; I.V.:5601.1; Other:265; NG/GT:20; IV Piggyback:1450] Out: 5027 [Urine:911; Emesis/NG output:800]  PHYSICAL EXAMINATION: General:  Frail, non responsive, elderly female on vent HEENT: Ott-> vent, rt I j 2 port cvl XAJ:OINOMVE Neuro: non responsive HM:CNOB IR PULM:coarse rhonchi bilaterally SJ:GGEZMOQHU , surgical site noted lower abd Extremities: bilat lower ext with chronic vascular changes Skin:cool    LABS:  BMET  Recent Labs Lab  04/21/2017 1233 05/05/2017 1255 05/04/2017 1916 05/18/2017 1924 05/10/17 0030 05/10/17 0859  NA 138 142 144 144 140 139  K 4.5 4.6 5.2* 5.2* 4.5 5.9*  CL 99* 103  --   --  103 105  CO2 21*  --   --   --  23 20*  BUN 46* 51*  --   --  49* 54*  CREATININE 1.49* 1.30*  --   --  1.43* 2.11*  GLUCOSE 460* 488* 165*  --  129* 321*    Electrolytes  Recent Labs Lab 05/03/2017 1233 05/10/17 0030 05/10/17 0700 05/10/17 0859  CALCIUM 10.3 9.1  --  8.0*  MG  --   --  2.5*  --   PHOS  --   --  6.4*  --     CBC  Recent Labs Lab 04/20/2017 1233  05/10/17 0030 05/10/17 0459 2017/05/16 0328  WBC 14.6*  --  17.7*  --  17.8*  HGB 15.4*  < > 14.6 13.3 10.8*  HCT 48.1*  < > 46.6* 42.0 35.9*  PLT 207  --  167  --  115*  < > = values in this interval not displayed.  Coag's  Recent Labs Lab 05/10/17 0859  INR 1.47    Sepsis Markers  Recent Labs Lab 05/04/2017 1256 05/10/17 0713 05/10/17 0859 2017/05/16 0328  LATICACIDVEN 6.25* 4.6* 8.2*  --   PROCALCITON  --   --   --  17.12    ABG  Recent Labs Lab 05/10/17 0539 05/10/17 0658 05/10/17 0837  PHART 7.452* 7.185* 7.247*  PCO2ART 40.0 69.3* 52.9*  PO2ART 136.0* 300* 190.0*    Liver Enzymes  Recent Labs Lab 05/19/2017 1233  AST 61*  ALT 39  ALKPHOS 157*  BILITOT 1.0  ALBUMIN 3.9    Cardiac Enzymes  Recent Labs Lab 05/10/17 0700 05/10/17 1359 05/10/17 1830  TROPONINI 0.27* 0.67* 0.96*    Glucose  Recent Labs Lab May 18, 2017 0115 05-18-2017 0205 05-18-17 0314 2017/05/18 0411 05/18/17 0526 2017/05/18 0620  GLUCAP 125* 123* 143* 127* 111* 102*    Imaging US Abdomen Limited  Result Date: 05/10/2017 CLINICAL DATA:  Abdominal distention.  Evaluate for ascites. EXAM: LIMITED ABDOMEN ULTRASOUND FOR ASCITES TECHNIQUE: Limited ultrasound survey for ascites was performed in all four abdominal quadrants. COMPARISON:  CT scan May 09, 2017 FINDINGS: A complex collection measuring 11 x 6 x 9 cm is seen in left lower  quadrant. No internal blood flow. Trace right pleural effusion. No other abnormalities. IMPRESSION: 1. There is a nonspecific collection measuring 11 x 6 x 9 cm in the left lower quadrant. CT imaging could better evaluate. 2. Trace right pleural effusion. Electronically Signed   By: Dorise Bullion III M.D   On: 05/10/2017 09:52   SIGNIFICANT EVENTS: 9/20 Admitted, OR, extubated 9/21 Intubated, Cardiac Arrest x 86mins  ANTIBIOTICS: Vancomycin (started 9/20 PM)9/21>> Cefepime 9/21>>  LINES/TUBES: RIJ TLC (placed 9/20) Left radial A-line (placed 9/20) 7.0 ETT  Foley Catheter OG tube 2-PIV's  ASSESSMENT / PLAN: 50 yoF w/previous previous bilateral iliac artery stenting (2002) who presented on 9/20 unable to walk, move, or feel bilateral legs found to have an acute aortic occulusion taken to the OR for aortoiliac thromboembolectomy. Extubated post-op but progressively became hypotensive and hypoxic who required emergent intubation and subsequently suffered PEA/ VT cardiac arrest x 5 mins.     PULMONARY 1. Acute hypoxic and Acute Hypercapneic respiratory failure; Aspiration Pneumonia - continue mechanical ventilation, severe hypercapnea this AM for which increased RR from 12 to 20; with continued acidosis, pco2 52 and ph 7.25 - CXR showed a LLL infiltrate. It is likely she aspirated during the cardiac arrest. Will obtain sputum culture and start Aztreonam in addition to the Vanc she is already on. - repeat CXR in AM - NPO; continue GI prophylaxis; holding DVT prophylaxis due to GIB - VAP prevention measures   CARDIOVASCULAR 1. Cardiac arrest - patient underwent a PEA/VT arrest overnight requiring 5 minutes of CPR before attaining ROSC. She was not at neurologic baseline post arrest but was not started on therapeutic hypothermia given her bleeding - TTE with ef 55% -On epi/levo drip, increasing acidosis. Now DNR - At some point will need Head CT once more stable 2. Hx Afib; Afib with  RVR: - has hx Afib; went into RVR post-op. - replete electrolytes 3. Acute Aortic Occulusion s/p aortoiliac thromboembolectomy (05/18/2017) - abdomen became more distended post-op, raising concern for bleeding into abdomen or development of compartment syndrome - bladder pressure was 12 overnight; repeat measurement now 9-10 - Hgb stable postop and Surgery does not think she is bleeding into her abdomen - stopping heparin gtt now given severity of her UGIB. - Abdominal ultrasound is pending. 4. Shock: - refractory in setting of post arrest, multiorgan failure, refractory acidosis in 83 yo  DNR - continue levophed/Epi gtt till family has further discussions as to ? Comfort care - continue monitoring CVP and UOP - continue  to trend lactates 5. Hx HTN, HLD, CAD, dHF, mod AS, mild MS, PAD  RENAL Lab Results  Component Value Date   CREATININE 2.11 (H) 05/10/2017   CREATININE 1.43 (H) 05/10/2017   CREATININE 1.30 (H) 04/22/2017   CREATININE 1.66 (H) 01/01/2017   CREATININE 1.13 (H) 07/04/2016   CREATININE 1.61 (H) 05/17/2016    Recent Labs Lab 04/21/2017 1924 05/10/17 0030 05/10/17 0859  K 5.2* 4.5 5.9*     1. Rhabdomyolysis; AKI-on-CKD - Rising creatine and K+  - Now DNR with poor poor prognosis, CRRT not planned - continue monitoring UOP closely - rhabdo with CK 14,950; continue to monitor CKq8hrs  - Replace electrolytes as indicated   GASTROINTESTINAL 1. UGIB:  - has approx 600cc of coffee ground emesis in her OG tube cannister - continue NPO; PPI gtt   HEMATOLOGIC 1. Hx DVT; Hx Anemia: - holding anticoag as above - monitor Hgb  INFECTIOUS 1. Aspiration Pneumonia: -Aztreonam; continue Vanc - trend lactate and procalcitonin  ENDOCRINE CBG (last 3)   Recent Labs  06/10/17 0411 06/10/17 0526 06-10-17 0620  GLUCAP 127* 111* 102*     1. Insulin-dependent DM: -off insulin drip now ssi - NPO   NEUROLOGIC 1 Acute Encephalopathy:  - started  post-arrest likely anoxic encephalopathy; continue to monitor. Fentanyl PRN for vent tolerance. Head CT once more stable.   FAMILY  - Updates:Family has agreed to DNR status. Extremely poor prognosis. Further discussions will be needed. - Inter-disciplinary family meet or Palliative Care meeting due by:  05/17/17  30 min cct  Emma Yu ACNP [facility] Shape PCCM Pager [phone] till 3 pm If no answer page [phone] 2017/06/10, 7:44 AM   STAFF NOTE: I, Emma Roof, MD FACP have personally reviewed patient's available data, including medical history, events of note, physical examination and test results as part of my evaluation. I have discussed with resident/NP and other care providers such as pharmacist, RN and RRT. In addition, I personally evaluated patient and elicited key findings of: not awake, per NOT reactive to light, NOt fc, not moving any ext to pain, corneals wnl, NO cough, no gag, abdo ios soft, she is mottled legs and abdomen, low to no BS, lower ext warm chronic changes, tense to some extent, pcxr which I reviewed showed ett wnl, without overt edema, she has MODS with concerns bowel ischemia ( although abdo soft) and rule out lower ext ischemia, she is DNR and family does not wish any forms of escalation, she is NOT a candidate for HD and likley will not survive the day, I expressed that concern to son and offered comfort care, he is considering this, we are maxed on epi and levophed, no incerase will change outcome, ABg revieweed agree we should increase MV ( rate ) , cpk noted, alllow pos balance, cvp 5, allow pos balance, was pos 6.3 liter last 24 hours, unsure of accuracy in CVP, I spent time discussing with son all of above. The patient is critically ill with multiple organ systems failure and requires high complexity decision making for assessment and support, frequent evaluation and titration of therapies, application of advanced monitoring technologies and extensive  interpretation of multiple databases.   Critical Care Time devoted to patient care services described in this note is 30 Minutes. This time reflects time of care of this signee: Emma Roof, MD FACP. This critical care time does not reflect procedure time, or teaching time or supervisory time of PA/NP/Med student/Med Resident etc but could involve  care discussion time. Rest per NP/medical resident whose note is outlined above and that I agree with   Emma Yu. Emma Mould, MD, [place] Pgr: [facility] May 15, 2017 10:07 AM

## 2017-05-20 NOTE — Progress Notes (Signed)
Patient asystole on monitor.  No heart tones auscultated by 2 RN's-Jeptha Hinnenkamp & Hyui.  Patient pronounced at 1600.  Ventilator turned off per protocol.  Patient's son and family friend present at bedside.  Son declines chaplain, as family's personal chaplain is on the way.  Emotional support offered to family. Attending MD notified. [place], Ardeth Sportsman

## 2017-05-20 NOTE — Procedures (Signed)
Extubation Procedure Note  Patient Details:   Name: Emma Yu DOB: 16-Nov-1926 MRN: [id number]   Airway Documentation:     Evaluation  O2 sats: pt expired Complications:  Patient    Pt was extubated after she expired.   Jesse Sans 28-May-2017, 4:28 PM

## 2017-05-20 NOTE — Progress Notes (Signed)
PT Cancellation Note  Patient Details Name: Emma Yu MRN: [id number] DOB: 1927-08-16   Cancelled Treatment:    Reason Eval/Treat Not Completed: Patient not medically ready   Duncan Dull 2017-05-25, 6:33 AM

## 2017-05-20 DEATH — deceased

## 2017-06-20 NOTE — Discharge Summary (Signed)
Discharge summary  83 year old female with PMH of Afib not on anticoagulation (due to fall risk), previous bilateral iliac artery stenting, CKD, CAD, dHF, HTN, HLD, AS, and DM on insulin gtt, admitted on 9/20 with acute aortic occlusion.  She was in her usual state of health, independent in [place], lives with son, until 9/20 and could not stand up after using bathroom. On arrival to ED, she could not move or feel her legs. She was emergently taken to the OR per Dr. Donzetta Matters with aortogram and aortoiliac thromboembolectomy performed. She was extubated postoperatively and taken to the ICU. She was noted to have pink tinged urine and progressive decreased UO, hypotension, and hypoxia requiring NRB and fluid bolus.   SIGNIFICANT EVENTS: 9/20 Admitted, OR, extubated 9/21 Intubated, Cardiac Arrest x 40mins  Remained with MODS, worsening arf, acidosis, and shock D/w med poa son. Approaching comofrt care Expired  1. Final Diagnsosis  1. Anoxic brain injury 2. Ischemic bowel presumed 3. Acidosis 4. Shock 5. Acute renal failure 6. S/p aortic occlusion, s/p aorto iliac thromboembolectomy  Lavon Paganini. Titus Mould, MD, [place] Pgr: [facility]

## 2017-08-29 ENCOUNTER — Ambulatory Visit: Payer: Medicare Other | Admitting: Internal Medicine
